# Patient Record
Sex: Female | Born: 1955
Health system: Southern US, Community
[De-identification: ages and names within clinical notes are randomized; demographics above are authoritative.]

## PROBLEM LIST (undated history)

## (undated) DIAGNOSIS — K3184 Gastroparesis: Secondary | ICD-10-CM

## (undated) DIAGNOSIS — H269 Unspecified cataract: Secondary | ICD-10-CM

## (undated) DIAGNOSIS — T7840XA Allergy, unspecified, initial encounter: Secondary | ICD-10-CM

## (undated) DIAGNOSIS — E785 Hyperlipidemia, unspecified: Secondary | ICD-10-CM

## (undated) DIAGNOSIS — K649 Unspecified hemorrhoids: Secondary | ICD-10-CM

## (undated) DIAGNOSIS — K5790 Diverticulosis of intestine, part unspecified, without perforation or abscess without bleeding: Secondary | ICD-10-CM

## (undated) DIAGNOSIS — I341 Nonrheumatic mitral (valve) prolapse: Secondary | ICD-10-CM

## (undated) DIAGNOSIS — K219 Gastro-esophageal reflux disease without esophagitis: Secondary | ICD-10-CM

## (undated) DIAGNOSIS — K5909 Other constipation: Secondary | ICD-10-CM

## (undated) DIAGNOSIS — B029 Zoster without complications: Secondary | ICD-10-CM

## (undated) DIAGNOSIS — K589 Irritable bowel syndrome without diarrhea: Secondary | ICD-10-CM

## (undated) DIAGNOSIS — K602 Anal fissure, unspecified: Secondary | ICD-10-CM

## (undated) DIAGNOSIS — K317 Polyp of stomach and duodenum: Secondary | ICD-10-CM

## (undated) DIAGNOSIS — N390 Urinary tract infection, site not specified: Secondary | ICD-10-CM

## (undated) DIAGNOSIS — M810 Age-related osteoporosis without current pathological fracture: Secondary | ICD-10-CM

## (undated) DIAGNOSIS — I2510 Atherosclerotic heart disease of native coronary artery without angina pectoris: Secondary | ICD-10-CM

## (undated) HISTORY — DX: Allergy, unspecified, initial encounter: T78.40XA

## (undated) HISTORY — DX: Irritable bowel syndrome, unspecified: K58.9

## (undated) HISTORY — DX: Zoster without complications: B02.9

## (undated) HISTORY — DX: Gastroparesis: K31.84

## (undated) HISTORY — DX: Age-related osteoporosis without current pathological fracture: M81.0

## (undated) HISTORY — PX: ESOPHAGOGASTRODUODENOSCOPY: SHX1529

## (undated) HISTORY — DX: Other constipation: K59.09

## (undated) HISTORY — DX: Anal fissure, unspecified: K60.2

## (undated) HISTORY — DX: Nonrheumatic mitral (valve) prolapse: I34.1

## (undated) HISTORY — DX: Diverticulosis of intestine, part unspecified, without perforation or abscess without bleeding: K57.90

## (undated) HISTORY — DX: Gastro-esophageal reflux disease without esophagitis: K21.9

## (undated) HISTORY — DX: Hyperlipidemia, unspecified: E78.5

## (undated) HISTORY — DX: Unspecified cataract: H26.9

## (undated) HISTORY — DX: Polyp of stomach and duodenum: K31.7

## (undated) HISTORY — PX: COLONOSCOPY: SHX174

---

## 2001-02-01 ENCOUNTER — Ambulatory Visit (HOSPITAL_COMMUNITY): Admission: RE | Admit: 2001-02-01 | Discharge: 2001-02-01 | Payer: Self-pay | Admitting: Gastroenterology

## 2004-01-14 ENCOUNTER — Ambulatory Visit: Payer: Self-pay | Admitting: Family Medicine

## 2004-03-23 ENCOUNTER — Ambulatory Visit: Payer: Self-pay | Admitting: Family Medicine

## 2004-03-30 ENCOUNTER — Ambulatory Visit: Payer: Self-pay | Admitting: Family Medicine

## 2004-04-13 ENCOUNTER — Ambulatory Visit: Payer: Self-pay | Admitting: Family Medicine

## 2004-04-27 ENCOUNTER — Ambulatory Visit: Payer: Self-pay | Admitting: Family Medicine

## 2010-02-25 ENCOUNTER — Encounter (INDEPENDENT_AMBULATORY_CARE_PROVIDER_SITE_OTHER): Payer: Self-pay | Admitting: *Deleted

## 2010-03-03 NOTE — Letter (Signed)
Summary: New Patient letter  [facility]  [address] [place], [place] [address]   Phone: [phone]  Fax: [phone]       02/25/2010 MRN: [id number]  [facility] [address] [place], [place]  [address]  Dear Ruth Terry,  Welcome to the Gastroenterology Division at [facility].    You are scheduled to see Dr.  Jarold Motto on 04-03-10 at 9:45A.M. on the 3rd floor at [facility], [address]  We ask that you try to arrive at our office 15 minutes prior to your appointment time to allow for check-in.  We would like you to complete the enclosed self-administered evaluation form prior to your visit and bring it with you on the day of your appointment.  We will review it with you.  Also, please bring a complete list of all your medications or, if you prefer, bring the medication bottles and we will list them.  Please bring your insurance card so that we may make a copy of it.  If your insurance requires a referral to see a specialist, please bring your referral form from your primary care physician.  Co-payments are due at the time of your visit and may be paid by cash, check or credit card.     Your office visit will consist of a consult with your physician (includes a physical exam), any laboratory testing he/she may order, scheduling of any necessary diagnostic testing (e.g. x-ray, ultrasound, CT-scan), and scheduling of a procedure (e.g. Endoscopy, Colonoscopy) if required.  Please allow enough time on your schedule to allow for any/all of these possibilities.    If you cannot keep your appointment, please call [phone] to cancel or reschedule prior to your appointment date.  This allows [place] the opportunity to schedule an appointment for another patient in need of care.  If you do not cancel or reschedule by 5 p.m. the business day prior to your appointment date, you will be charged a $50.00 late cancellation/no-show fee.    Thank you for choosing  [facility] for your medical needs.  We appreciate the opportunity to care for you.  Please visit [place] at our website  to learn more about our practice.                     Sincerely,                                                             The Gastroenterology Division

## 2010-04-03 ENCOUNTER — Other Ambulatory Visit (INDEPENDENT_AMBULATORY_CARE_PROVIDER_SITE_OTHER): Payer: Self-pay

## 2010-04-03 ENCOUNTER — Ambulatory Visit (INDEPENDENT_AMBULATORY_CARE_PROVIDER_SITE_OTHER): Payer: Self-pay | Admitting: Gastroenterology

## 2010-04-03 ENCOUNTER — Encounter: Payer: Self-pay | Admitting: Gastroenterology

## 2010-04-03 VITALS — BP 112/68 | HR 80 | Ht 60.0 in | Wt 102.0 lb

## 2010-04-03 DIAGNOSIS — R161 Splenomegaly, not elsewhere classified: Secondary | ICD-10-CM

## 2010-04-03 DIAGNOSIS — K3189 Other diseases of stomach and duodenum: Secondary | ICD-10-CM

## 2010-04-03 DIAGNOSIS — R1013 Epigastric pain: Secondary | ICD-10-CM

## 2010-04-03 DIAGNOSIS — K30 Functional dyspepsia: Secondary | ICD-10-CM | POA: Insufficient documentation

## 2010-04-03 DIAGNOSIS — K589 Irritable bowel syndrome without diarrhea: Secondary | ICD-10-CM

## 2010-04-03 LAB — IBC PANEL
Saturation Ratios: 20.9 % (ref 20.0–50.0)
Transferrin: 300.2 mg/dL (ref 212.0–360.0)

## 2010-04-03 LAB — IGA: IgA: 117 mg/dL (ref 68–378)

## 2010-04-03 LAB — FERRITIN: Ferritin: 36.4 ng/mL (ref 10.0–291.0)

## 2010-04-03 LAB — AMYLASE: Amylase: 106 U/L (ref 27–131)

## 2010-04-03 LAB — LIPASE: Lipase: 43 U/L (ref 11.0–59.0)

## 2010-04-03 LAB — VITAMIN B12: Vitamin B-12: 370 pg/mL (ref 211–911)

## 2010-04-03 MED ORDER — BENEFIBER PO POWD: 1.0000 | Freq: Every day | ORAL | Status: DC

## 2010-04-03 MED ORDER — PEG-KCL-NACL-NASULF-NA ASC-C 100 G PO SOLR: 1.0000 | Freq: Once | ORAL | Status: AC

## 2010-04-03 MED ORDER — HYOSCYAMINE SULFATE 0.125 MG SL SUBL: 0.1250 mg | SUBLINGUAL_TABLET | SUBLINGUAL | Status: DC | PRN

## 2010-04-03 NOTE — Progress Notes (Signed)
History of Present Illness:  This is a 55 year old Caucasian female with chronic IBS with alternating diarrhea and constipation. Now presents with a two-month history of worsening abdominal cramping or subcostal areas bilaterally with increased abdominal gas and bloating but no melena or hematochezia or specific hepatobiliary complaints. He does have persistent dyspepsia refractory to Nexium therapy, but not had previous endoscopy. There is an H. pylori infection her family apparently. I previously evaluated her several years ago with a colonoscopy in 2003 which was unremarkable. Recent upper abdominal ultrasound exam [facility] was reviewed and is normal as is CBC and metabolic profile. Patient denies a specific food intolerances or lactose intolerance. She does not use sorbitol or fructose. Associated with her problems has been a 710 pound weight loss. She denies abuse of alcohol, cigarettes, or NSAIDs. There is no history of dysphagia, fever, chills, or other systemic complaints.     ROS: The remainder of the 10 point ROS is negative. Her leg bag right posterior shoulder discomfort not related to eating or any specific hepatobiliary complaints. There is no known history of hepatitis or pancreatitis, and recent liver function tests were normal. She denies any specific cardiovascular or pulmonary complaints. He does have excessive gas, bloating, and belching.     Physical Exam: General well developed well nourished patient in no acute distress, appearing their stated age Eyes PERRLA, no icterus, fundoscopic exam per opthamologist Skin no lesions noted Neck supple, no adenopathy, no thyroid enlargement, no tenderness Chest clear to percussion and auscultation Heart no significant murmurs, gallops or rubs noted Abdomen no hepatosplenomegaly masses or tenderness, BS normal.  Rectal inspection normal no fissures, or fistulae noted.  No masses or tenderness on digital exam.  Stool guaiac negative. Extremities no acute joint lesions, edema, phlebitis or evidence of cellulitis. Neurologic patient oriented x 3, cranial nerves intact, no focal neurologic deficits noted. Psychological mental status normal and normal affect.  Assessment and plan: Chronic IBS with recent worsening of unexplained etiology-rule out H. pylori infection, celiac disease, or bacterial overgrowth syndrome. I have scheduled her for followup colonoscopy, endoscopy with small bowel biopsy, biopsies for H. pylori the dissection, and I placed her on when necessary sublingual Levsin as tolerated. Celiac serologies and anemia profile ordered.

## 2010-04-03 NOTE — Patient Instructions (Signed)
Your prescription(s) have been sent to you pharmacy.  Your procedure has been scheduled for _____, please follow the seperate instructions.  Please go to the basement today for your labs.  Today you were given a high fiber diet.

## 2010-04-05 ENCOUNTER — Other Ambulatory Visit: Payer: Self-pay | Admitting: Gastroenterology

## 2010-04-06 ENCOUNTER — Encounter: Payer: Self-pay | Admitting: Gastroenterology

## 2010-04-06 LAB — GLIA (IGA/G) + TTG IGA: Gliadin IgA: 6.4 U/mL (ref <20)

## 2010-04-17 ENCOUNTER — Encounter: Payer: Self-pay | Admitting: Gastroenterology

## 2010-04-20 ENCOUNTER — Encounter: Payer: Self-pay | Admitting: Gastroenterology

## 2010-04-20 ENCOUNTER — Ambulatory Visit (AMBULATORY_SURGERY_CENTER): Payer: Self-pay | Admitting: Gastroenterology

## 2010-04-20 DIAGNOSIS — K317 Polyp of stomach and duodenum: Secondary | ICD-10-CM

## 2010-04-20 DIAGNOSIS — Z1211 Encounter for screening for malignant neoplasm of colon: Secondary | ICD-10-CM

## 2010-04-20 DIAGNOSIS — K3189 Other diseases of stomach and duodenum: Secondary | ICD-10-CM

## 2010-04-20 DIAGNOSIS — K5730 Diverticulosis of large intestine without perforation or abscess without bleeding: Secondary | ICD-10-CM

## 2010-04-20 DIAGNOSIS — K5790 Diverticulosis of intestine, part unspecified, without perforation or abscess without bleeding: Secondary | ICD-10-CM

## 2010-04-20 DIAGNOSIS — K589 Irritable bowel syndrome without diarrhea: Secondary | ICD-10-CM

## 2010-04-20 DIAGNOSIS — K219 Gastro-esophageal reflux disease without esophagitis: Secondary | ICD-10-CM

## 2010-04-20 DIAGNOSIS — D131 Benign neoplasm of stomach: Secondary | ICD-10-CM

## 2010-04-20 DIAGNOSIS — D1330 Benign neoplasm of unspecified part of small intestine: Secondary | ICD-10-CM

## 2010-04-20 HISTORY — DX: Diverticulosis of intestine, part unspecified, without perforation or abscess without bleeding: K57.90

## 2010-04-20 HISTORY — DX: Polyp of stomach and duodenum: K31.7

## 2010-04-20 MED ORDER — SODIUM CHLORIDE 0.9 % IV SOLN: 500.0000 mL | INTRAVENOUS | Status: DC

## 2010-04-20 NOTE — Patient Instructions (Signed)
Please read over discharge instruction sheets given. Read over handouts given on Diverticulosis and high fiber diet. Repeat colonoscopy in 10 years. Biopsies taken, you will receive letter in your mail in about 1-2 weeks. Resume your regular daily medications. Please call us with any questions or concerns.

## 2010-04-21 ENCOUNTER — Telehealth: Payer: Self-pay | Admitting: *Deleted

## 2010-04-21 NOTE — Telephone Encounter (Signed)
Follow up Call- Patient questions:  Do you have a fever, pain , or abdominal swelling? no Pain Score  0 *  Have you tolerated food without any problems? yes  Have you been able to return to your normal activities? yes  Do you have any questions about your discharge instructions: Diet   no Medications  yes Follow up visit  no  Do you have questions or concerns about your Care? yes  Actions: * If pain score is 4 or above: No action needed, pain <4.  Patient stating she was concerned that she may need to be treated for GERD. Patient stating according to the Procedure Report she is being treated for GERD. Patient is presently not on any medications for Reflux. States she took Nexium given by her PCP for 3 days without any relief. This note routed to Dr. Jarold Motto.

## 2010-04-21 NOTE — Telephone Encounter (Signed)
Called patient to inform of Dr. Norval Gable response to her questions regarding treatment for GERD. Patient verbalized understanding that the physician is awaiting biopsy results and will be in touch with her regarding the results and possible treatment.

## 2010-04-21 NOTE — Telephone Encounter (Signed)
Awaiting biopsies

## 2010-04-22 ENCOUNTER — Telehealth: Payer: Self-pay | Admitting: Gastroenterology

## 2010-04-23 NOTE — Telephone Encounter (Signed)
LMOM for pt to call back.

## 2010-04-23 NOTE — Telephone Encounter (Signed)
Notified pt that Dr Jarold Motto thinks the loose stools are from the prep and that the shoulder pain is referred pain from the gas used in the procedure. Pt will continue to take Levsin for cramping.

## 2010-04-23 NOTE — Telephone Encounter (Signed)
Pt had ECL on 04/20/10 revealing mild diverticulosis and BX on ENDO for polyp and CLO . Pt reports she felt good the next day, Tuesday am, but then that evening she had upper abdominal cramping and everything she ate went thru her. She still has some cramping this am on and off but not as bad. She also c/o pain in her right shoulder- is that related? Informed pt the loose stools are still related to the prep and the shoulder pain is referred pain from the gas used during her procedure. Any advice? Thanks.

## 2010-04-23 NOTE — Telephone Encounter (Signed)
i agree. 

## 2010-04-24 ENCOUNTER — Encounter: Payer: Self-pay | Admitting: Gastroenterology

## 2010-04-29 ENCOUNTER — Telehealth: Payer: Self-pay | Admitting: Gastroenterology

## 2010-04-29 ENCOUNTER — Encounter: Payer: Self-pay | Admitting: Gastroenterology

## 2010-04-29 DIAGNOSIS — K9433 Esophagostomy malfunction: Secondary | ICD-10-CM

## 2010-04-29 DIAGNOSIS — R1012 Left upper quadrant pain: Secondary | ICD-10-CM

## 2010-04-29 DIAGNOSIS — IMO0001 Reserved for inherently not codable concepts without codable children: Secondary | ICD-10-CM

## 2010-04-29 DIAGNOSIS — K9430 Esophagostomy complications, unspecified: Secondary | ICD-10-CM

## 2010-04-29 LAB — HELICOBACTER PYLORI SCREEN-BIOPSY: UREASE: NEGATIVE

## 2010-04-29 NOTE — Telephone Encounter (Signed)
NEXIUM 40 MG DAILY...[id number],,

## 2010-04-29 NOTE — Telephone Encounter (Signed)
Pt had ECL on 04/20/10 with normal COLON and Sessile polyp removed that was benign. H.Pylori was negative but I had to call for results downstairs so you did not see it. Pt stated she is still cramping on her r side and having terrible indigestion. Pt's only med from [place] is Benefiber. Pt wants to know if she can have something for reflux? Thanks.

## 2010-04-29 NOTE — Telephone Encounter (Signed)
History of Present Illness: This is a 55 year old Caucasian female with chronic IBS with alternating diarrhea and constipation. Now presents with a two-month history of worsening abdominal cramping or subcostal areas bilaterally with increased abdominal gas and bloating but no melena or hematochezia or specific hepatobiliary complaints. He does have persistent dyspepsia refractory to Nexium therapy, but not had previous endoscopy. There is an H. pylori infection her family apparently. I previously evaluated her several years ago with a colonoscopy in 2003 which was unremarkable. Recent upper abdominal ultrasound exam [facility] was reviewed and is normal as is CBC and metabolic profile. Patient denies a specific food intolerances or lactose intolerance. She does not use sorbitol or fructose. Associated with her problems has been a 710 pound weight loss. She denies abuse of alcohol, cigarettes, or NSAIDs. There is no history of dysphagia, fever, chills, or other systemic complaints.   I'm sorry I did not know until I called her back she had been on Nexium for about 45 days. She is willing to try it again if you want her to. Also, you had her on Hyoscyamine before that was removed from her med list . Is it ok if she stays on that PRN? Thanks.

## 2010-04-30 ENCOUNTER — Encounter: Payer: Self-pay | Admitting: *Deleted

## 2010-04-30 MED ORDER — DEXLANSOPRAZOLE 60 MG PO CPDR: 60.0000 mg | DELAYED_RELEASE_CAPSULE | Freq: Every day | ORAL | Status: DC

## 2010-04-30 NOTE — Telephone Encounter (Signed)
LMOM for pt informing her the scan alone is $689, that does not include the radiologist's fee or other incidental charges. I left her samples at the front desk.

## 2010-04-30 NOTE — Telephone Encounter (Signed)
Spoke with pt about Dr Norval Gable recommendation for GES. Pt has no problem with the test and we can schedule it. Pt's concern is that she's going to the beach 05/05/10 thru 05/12/10 and she would like to feel better. Pt would like the Hyoscyamine renewed and perhaps try another PPI if OK- maybe Dexilant samples? Is that OK?   Thanks.

## 2010-04-30 NOTE — Telephone Encounter (Signed)
Pt scheduled for GES 05/18/10 at 10am. NPO after midnight; stop all stomach meds 24 hr prior.

## 2010-04-30 NOTE — Telephone Encounter (Signed)
ok 

## 2010-04-30 NOTE — Telephone Encounter (Signed)
Spoke with pt to inform her of the GES appointment time and to inform her I will leave sample of Dexilant at the front desk for her. Pt stated she still has hyoscamine and she will use that prn. I will check to see how much she will need as a co pay prior to GES.

## 2010-04-30 NOTE — Telephone Encounter (Signed)
Schedule gastric emptying scan. All other labs and biopsies are normal.

## 2010-04-30 NOTE — Telephone Encounter (Signed)
Lmom for pt to call back. Dr Jarold Motto would like for her to have a Gastric Emptying Scan.

## 2010-05-04 ENCOUNTER — Telehealth: Payer: Self-pay | Admitting: Gastroenterology

## 2010-05-04 NOTE — Telephone Encounter (Signed)
Pt states that she had an endo/colon with Dr. Jarold Motto 2 weeks ago. Pt states that since that time her IBS has really been acting up. She was having problems last week with as soon as she ate something she was running to the bathroom with diarrhea. Over the weekend she started having problems with constipation. Pt wants to know what she can take to help get her "back on track and regulated again." Dr. Christella Hartigan as doc of the day please advise.

## 2010-05-04 NOTE — Telephone Encounter (Signed)
If she is not already on daily fiber, she should start.  Once daily citrucel.

## 2010-05-04 NOTE — Telephone Encounter (Signed)
Pt aware of Dr Jacobs recommendations 

## 2010-05-18 ENCOUNTER — Encounter (HOSPITAL_COMMUNITY)
Admission: RE | Admit: 2010-05-18 | Discharge: 2010-05-18 | Disposition: A | Discharge status: Home / Self Care | Payer: Self-pay | Source: Ambulatory Visit | Attending: Gastroenterology | Admitting: Gastroenterology

## 2010-05-18 ENCOUNTER — Telehealth: Payer: Self-pay | Admitting: *Deleted

## 2010-05-18 ENCOUNTER — Encounter (HOSPITAL_COMMUNITY): Payer: Self-pay

## 2010-05-18 DIAGNOSIS — K219 Gastro-esophageal reflux disease without esophagitis: Secondary | ICD-10-CM | POA: Insufficient documentation

## 2010-05-18 DIAGNOSIS — R109 Unspecified abdominal pain: Secondary | ICD-10-CM | POA: Insufficient documentation

## 2010-05-18 DIAGNOSIS — R1012 Left upper quadrant pain: Secondary | ICD-10-CM

## 2010-05-18 DIAGNOSIS — IMO0001 Reserved for inherently not codable concepts without codable children: Secondary | ICD-10-CM

## 2010-05-18 DIAGNOSIS — K3189 Other diseases of stomach and duodenum: Secondary | ICD-10-CM | POA: Insufficient documentation

## 2010-05-18 DIAGNOSIS — R6881 Early satiety: Secondary | ICD-10-CM | POA: Insufficient documentation

## 2010-05-18 MED ORDER — DEXLANSOPRAZOLE 60 MG PO CPDR: 60.0000 mg | DELAYED_RELEASE_CAPSULE | Freq: Every day | ORAL | Status: DC

## 2010-05-18 MED ORDER — AMBULATORY NON FORMULARY MEDICATION: Status: DC

## 2010-05-18 MED ORDER — TECHNETIUM TC 99M SULFUR COLLOID
2.2000 | Freq: Once | INTRAVENOUS | Status: AC | PRN
  Administered 2010-05-18: 2.2 via ORAL

## 2010-05-18 NOTE — Telephone Encounter (Signed)
Lm with husband for pt to call back

## 2010-05-18 NOTE — Telephone Encounter (Signed)
Message copied by Graciella Freer on Mon May 18, 2010  4:28 PM ------      Message from: Jarold Motto, DAVID      Created: Mon May 18, 2010  4:05 PM       STEP 3 GASTROPARESIS DIET AND TRY DOMPERIDOME 10 MG AC,,TID,,,OV 1 MOS AFTER RX.

## 2010-05-18 NOTE — Telephone Encounter (Signed)
Notified pt I will mail her a copy of the step 3 gastroparesis diet. I will order Domperidome from [place] and Dexilant from [place]. I woll schedule her for  an OV when 1 month after she starts the Domperidone, pt stated understanding.

## 2010-05-21 ENCOUNTER — Telehealth: Payer: Self-pay | Admitting: *Deleted

## 2010-05-21 MED ORDER — DEXLANSOPRAZOLE 60 MG PO CPDR: 60.0000 mg | DELAYED_RELEASE_CAPSULE | Freq: Every day | ORAL | Status: DC

## 2010-05-21 NOTE — Telephone Encounter (Signed)
Message copied by Graciella Freer on Thu May 21, 2010  2:52 PM ------      Message from: Jarold Motto, DAVID      Created: Mon May 18, 2010  4:05 PM       STEP 3 GASTROPARESIS DIET AND TRY DOMPERIDOME 10 MG AC,,TID,,,OV 1 MOS AFTER RX.

## 2010-05-21 NOTE — Telephone Encounter (Signed)
Message copied by Graciella Freer on Thu May 21, 2010  2:53 PM ------      Message from: Jarold Motto, DAVID      Created: Mon May 18, 2010  4:05 PM       STEP 3 GASTROPARESIS DIET AND TRY DOMPERIDOME 10 MG AC,,TID,,,OV 1 MOS AFTER RX.

## 2010-05-21 NOTE — Telephone Encounter (Signed)
Pt called to report Dexilant is working well for her- will we order it? She has heard from the pharmacy about her  Domperidone and she will call to set up the F/U appt. She also questions the Gastroparesis Step 3 diet and the avoidance of fiber. Pt stated she needs the fiber d/t her IBS and Fiber One bars and Prunes were helping her. Left a message with her husband she can drink prune juice and take the Benefiber. Dr Jarold Motto, any other advice for fiber sources?

## 2010-05-22 MED ORDER — DEXLANSOPRAZOLE 60 MG PO CPDR: 60.0000 mg | DELAYED_RELEASE_CAPSULE | Freq: Every day | ORAL | Status: DC

## 2010-05-22 NOTE — Telephone Encounter (Addendum)
Reordered Dexilant to [facility]

## 2010-05-22 NOTE — Telephone Encounter (Signed)
Can advance diet once on domperidome.Marland KitchenMarland Kitchen

## 2010-06-02 ENCOUNTER — Telehealth: Payer: Self-pay | Admitting: *Deleted

## 2010-06-02 NOTE — Telephone Encounter (Signed)
Yes, good job.Marland KitchenMarland Kitchen

## 2010-06-02 NOTE — Telephone Encounter (Signed)
Notified pt that Dr Jarold Motto stated she can add back the fiber- Fiber One, Fiber brownies, prunes. She will start out taking the Benefiber tid until she gets regular and she can try to decrease it to bid which is what she took prior to her Movi Prep. She will try to remain on the remainder of Gas. Level 3 diet. Pt will call for any problems prior to her 06/23/10.

## 2010-06-02 NOTE — Telephone Encounter (Signed)
Pt has been scheduled to see Dr Jarold Motto on 06/23/10 at 11:15am.

## 2010-06-02 NOTE — Telephone Encounter (Signed)
Pt returned my call from several days ago. Pt reports she's doing "fair". She is taking the Domperidone and is on the Gastroparesis level 3 diet. Pt stated her bowels haven't been regular since her COLON on 04/20/10. Before the diet, pt was regular by eating prunes, Fiber One cereal and Fiber Brownies and Benefiber. She is having problems one day with diarrhea and constipation the next. Is it ok if she adds back some of the fiber? Thanks.

## 2010-06-02 NOTE — Telephone Encounter (Signed)
yes

## 2010-06-08 ENCOUNTER — Telehealth: Payer: Self-pay | Admitting: Gastroenterology

## 2010-06-08 NOTE — Telephone Encounter (Signed)
I spoke with the patient she would like to speak with Dr Jarold Motto about testing her for gallbladder disease.  She will come in tomorrow at 10:00 and see Dr Jarold Motto.

## 2010-06-09 ENCOUNTER — Ambulatory Visit (INDEPENDENT_AMBULATORY_CARE_PROVIDER_SITE_OTHER): Payer: Self-pay | Admitting: Gastroenterology

## 2010-06-09 ENCOUNTER — Encounter: Payer: Self-pay | Admitting: Gastroenterology

## 2010-06-09 DIAGNOSIS — K900 Celiac disease: Secondary | ICD-10-CM

## 2010-06-09 DIAGNOSIS — K9041 Non-celiac gluten sensitivity: Secondary | ICD-10-CM

## 2010-06-09 DIAGNOSIS — K3184 Gastroparesis: Secondary | ICD-10-CM

## 2010-06-09 NOTE — Patient Instructions (Addendum)
Continue all of your medications. Make an appt to come back and see Dr Jarold Motto in 6 weeks.

## 2010-06-09 NOTE — Progress Notes (Signed)
This is a [age]-year-old Caucasian female with chronic IBS area and she recently has had upper abdominal pain and early satiety with associated nausea, and had a gastric emptying scan which showed 60% retention at 2 hours. She's been on a step 3 gastroparesis diet and domperidone 10 mg 30 minutes before meals with only slight improvement. She had negative endoscopy and colonoscopy including small bowel biopsy. Her celiac serologies were all negative except for slightly increased IgG anti-gliadin. She also has lower abdominal cramping relieved with when necessary sublingual Levsin.    Current Medications, Allergies, Past Medical History, Past Surgical History, Family History and Social History were reviewed in [employer] record.  Pertinent Review of Systems Negative       Assessment and Plan: IBS with delayed gastric emptying. Had a long discussion today concerning her problems and have decided to continue current plans for another 6 weeks before proceeding with small bowel series or institution of a trial of gluten-free foods. She is to continue her fiber supplements and other medications as outlined. Encounter Diagnoses  Name Primary?  . Gastroparesis   . Gluten intolerance

## 2010-06-11 ENCOUNTER — Telehealth: Payer: Self-pay | Admitting: Gastroenterology

## 2010-06-11 NOTE — Telephone Encounter (Signed)
Pt states that benifiber is discontinued and she wanted to know what fiber to try I advised any otc fiber is fine.

## 2010-06-15 ENCOUNTER — Telehealth: Payer: Self-pay | Admitting: Gastroenterology

## 2010-06-15 MED ORDER — METRONIDAZOLE 250 MG PO TABS: 250.0000 mg | ORAL_TABLET | Freq: Three times a day (TID) | ORAL | Status: AC

## 2010-06-15 MED ORDER — RIFAXIMIN 550 MG PO TABS: 550.0000 mg | ORAL_TABLET | Freq: Two times a day (BID) | ORAL | Status: DC

## 2010-06-15 NOTE — Telephone Encounter (Signed)
Per Mike Gip, PA, ok to order Flagyl 250mg  po tid x 7 days. Notified pt I ordered the drug at [facility].

## 2010-06-15 NOTE — Telephone Encounter (Signed)
Pt will follow a low gas diet- mailed to her.

## 2010-06-15 NOTE — Telephone Encounter (Signed)
Pt reports loose watery stools for some time now. She saw Dr Jarold Motto on 06/09/10 and he started her on [employer]. She reports a few good days since starting that , but she's still bothered by the loose stools and gas. Pt stated her abdomen made so much noise, she had to leave church. Pt has stopped the prunes, but still eats a Fiber One Brownie, Fiber One Cereal and before, Benefiber. Benefiber has stopped production and pt tried Metamucil, but can't stand it. Advised pt to try the generic Benefiber at [facility] and Simethicone or GasX. I will ask Mike Gip, PA for suggestions. Pt stated understanding. [name] suggested she may have bacterial overgrowth and ordered Xifaxin; if too expensive, call back and we can order Flagyl. Pt stated understanding.

## 2010-06-23 ENCOUNTER — Ambulatory Visit: Payer: Self-pay | Admitting: Gastroenterology

## 2010-06-26 ENCOUNTER — Telehealth: Payer: Self-pay | Admitting: Gastroenterology

## 2010-06-26 NOTE — Telephone Encounter (Signed)
Needs ov....consider lotronex rx.Marland Kitchen

## 2010-06-26 NOTE — Telephone Encounter (Signed)
Pt scheduled to see Dr. Jarold Motto 07/13/10@10 :30am, pt aware of appt date and time.

## 2010-06-26 NOTE — Telephone Encounter (Signed)
Ov needed,consider Lotronex rx

## 2010-06-26 NOTE — Telephone Encounter (Signed)
Pt states that her reflux is better and her symptoms from gastroparesis seem to be better. Pt states that she was given Flagyl 250mg  to take TID for 7 days by Mike Gip PA. She finished this on Monday and has been having diarrhea since she stopped the Flagyl. She is also having a lot of cramping with the diarrhea, she states her stools are just mainly brown water. Pt doesn't know if she needs to take more Flagyl or what she should do. Dr. Jarold Motto please advise.

## 2010-06-29 ENCOUNTER — Telehealth: Payer: Self-pay | Admitting: Gastroenterology

## 2010-06-29 NOTE — Telephone Encounter (Signed)
Per note last week, Dr Jarold Motto stated pt needed an OV; Dr Jarold Motto had 1 tomorrow. Pt agreed to change and will come in at 2:30pm. Pt wanted to know whether to bring in a stool sample; pt stated she has a fever blister which she never has until there's "infection in my body". Advised pt to bring one in just in case.

## 2010-06-29 NOTE — Telephone Encounter (Signed)
Pt called on 06/26/10 stated since she stopped the Flagyl, she's had brown watery stools, sometimes 2-6 daily, lower abdominal cramping and awful gas pains. She was given an OV for 07/12/08 and you wrote consider Lotronex, Pt states overall, she's better with reflux and upper abdominal pain, but can't have a formed stool and the new pain and gas are really bothering her. She was told last week to stop the Benefiber and she doesn't think the [facility] is doing much good- no change in 3 weeks. Do you want stools samples, OV with you or Gunnar Fusi, Lotronex? Please advise.

## 2010-06-30 ENCOUNTER — Encounter: Payer: Self-pay | Admitting: Gastroenterology

## 2010-06-30 ENCOUNTER — Ambulatory Visit (INDEPENDENT_AMBULATORY_CARE_PROVIDER_SITE_OTHER): Payer: Self-pay | Admitting: Gastroenterology

## 2010-06-30 VITALS — BP 112/66 | HR 108 | Ht 60.0 in | Wt 97.2 lb

## 2010-06-30 DIAGNOSIS — R197 Diarrhea, unspecified: Secondary | ICD-10-CM

## 2010-06-30 DIAGNOSIS — T887XXA Unspecified adverse effect of drug or medicament, initial encounter: Secondary | ICD-10-CM

## 2010-06-30 DIAGNOSIS — T50905A Adverse effect of unspecified drugs, medicaments and biological substances, initial encounter: Secondary | ICD-10-CM

## 2010-06-30 DIAGNOSIS — K3184 Gastroparesis: Secondary | ICD-10-CM

## 2010-06-30 MED ORDER — RIFAXIMIN 550 MG PO TABS: 550.0000 mg | ORAL_TABLET | Freq: Two times a day (BID) | ORAL | Status: DC

## 2010-06-30 NOTE — Patient Instructions (Signed)
Please go to the basement today for your labs.  Take the Xifaxan samples one tablet twice a day for 7 days.  Decrease your Domperidone to once a day before lunch.

## 2010-06-30 NOTE — Progress Notes (Signed)
This is a 55 year old Caucasian female with gastroparesis currently doing much better on domperidone 10 mg 3 times a day. However, this is called crampy lower abdominal pain and diarrhea with a increased abdominal gas and bloating. She was treated . Her with metronidazole without improvement. She also has had no improvement with probiotic therapy. Recent endoscopy and colonoscopy were unremarkable except for acid reflux, and she is on Dexilant 60 mg a day. There's been no anorexia, weight loss, systemic or hepatobiliary complaints. She currently is on a low fiber diet, denies use of sorbitol or fructose. I do not think she has had prior cholecystectomy her abdominal surgery.  Current Medications, Allergies, Past Medical History, Past Surgical History, Family History and Social History were reviewed in [employer] record.  Pertinent Review of Systems Negative   Physical Exam: Healthy appearing, no acute distress, no mild chronic liver disease noted. Chest is clear cardiac exam is unremarkable. I cannot appreciate hepatosplenomegaly, abdominal masses, tenderness, ascites, or distention. Bowel sounds are normal. Mental status is clear peripheral extremities are unremarkable.   Assessment and Plan: Diarrhea either from domperidone or Dexilant. Recent colonoscopy was otherwise unremarkable. Also small bowel biopsy was normal without evidence of celiac disease. Her gastroparesis is much improved. I have reduced her domperidone dosage to 10 mg before her main meal of the day and twice a day if tolerated. Also appeared to have given her samples of Xifaxan 550 mg twice a day for one week trial. If her diarrhea persists, we may need to discontinue domperidone, Dexilant, and try up H2 blocker therapy. See her back in 2-3 weeks' time for followup. Encounter Diagnosis  Name Primary?  . Diarrhea Yes

## 2010-07-01 ENCOUNTER — Other Ambulatory Visit: Payer: Self-pay

## 2010-07-01 DIAGNOSIS — R197 Diarrhea, unspecified: Secondary | ICD-10-CM

## 2010-07-13 ENCOUNTER — Ambulatory Visit: Payer: Self-pay | Admitting: Gastroenterology

## 2010-07-13 ENCOUNTER — Telehealth: Payer: Self-pay | Admitting: Gastroenterology

## 2010-07-13 MED ORDER — CHOLESTYRAMINE 4 G PO PACK
PACK | ORAL | Status: DC
Start: 2010-07-13 — End: 2010-07-28

## 2010-07-13 NOTE — Telephone Encounter (Signed)
Informed pt Dr Jarold Motto wants her to try QUESTRAN 4g in juice midmorning- DO NOT TAKE WITHIN 2 HOURS OF OTHER MEDS!!! Pt stated understanding.

## 2010-07-13 NOTE — Telephone Encounter (Signed)
Try Questran 4 g with juice in mid a.m.

## 2010-07-13 NOTE — Telephone Encounter (Signed)
Left a message for patient to call us back .

## 2010-07-13 NOTE — Telephone Encounter (Signed)
Patient calling to report that decreasing the Domperidone to daily has not helped with the diarrhea, cramps and gas. She states that she is having diarrhea 2-3 times/day. When she is on antibiotics, the diarrhea is less and as soon as she finished the antibiotics, the diarrhea is back. She also, asked about the stool specimen results and I told her the results as not back yet. She is wondering if she should increase the Domperidone back. Please, advise.

## 2010-07-21 ENCOUNTER — Telehealth: Payer: Self-pay | Admitting: Gastroenterology

## 2010-07-21 ENCOUNTER — Ambulatory Visit: Payer: Self-pay | Admitting: Gastroenterology

## 2010-07-21 MED ORDER — PRAMOXINE-HC 1-1 % EX CREA: TOPICAL_CREAM | CUTANEOUS | Status: DC

## 2010-07-21 NOTE — Telephone Encounter (Signed)
Prn questran,analpram also

## 2010-07-21 NOTE — Telephone Encounter (Signed)
Pt was ordered Questran on 07/13/10 for diarrhea. She did not fill because she was waiting on payday. She reports no diarrhea since last week. Is Questran to be used on a full time basis or PRN? Also, can she have something for hemorrhoids? Thanks.

## 2010-07-21 NOTE — Telephone Encounter (Signed)
Informed pt of Dr Patterson's instructions; pt stated understanding. 

## 2010-07-21 NOTE — Telephone Encounter (Signed)
lmom for pt to call back. Dr Jarold Motto stated she can take the Questran PRN and he ordered her Analpram for her hemorrhoids.

## 2010-07-28 ENCOUNTER — Ambulatory Visit (INDEPENDENT_AMBULATORY_CARE_PROVIDER_SITE_OTHER): Payer: Self-pay | Admitting: Gastroenterology

## 2010-07-28 ENCOUNTER — Encounter: Payer: Self-pay | Admitting: Gastroenterology

## 2010-07-28 VITALS — BP 122/80 | HR 102 | Ht 60.0 in | Wt 96.0 lb

## 2010-07-28 DIAGNOSIS — Z8719 Personal history of other diseases of the digestive system: Secondary | ICD-10-CM | POA: Insufficient documentation

## 2010-07-28 DIAGNOSIS — K900 Celiac disease: Secondary | ICD-10-CM

## 2010-07-28 DIAGNOSIS — K3184 Gastroparesis: Secondary | ICD-10-CM

## 2010-07-28 DIAGNOSIS — K9041 Non-celiac gluten sensitivity: Secondary | ICD-10-CM

## 2010-07-28 DIAGNOSIS — K219 Gastro-esophageal reflux disease without esophagitis: Secondary | ICD-10-CM | POA: Insufficient documentation

## 2010-07-28 NOTE — Progress Notes (Signed)
History of Present Illness: This is a 55 year old Caucasian female with documented gastroparesis who developed rather severe diarrhea on domperidone 10 mg 3 times a day. I've decreased her dosage to 10 mg before her main meal of the day, and her diarrhea has abated. She continues with gas, bloating, and diffuse abdominal discomfort. Pending is stool elastase-1 exam. Review of her celiac profile shows an elevated IgG gliadin body. Small bowel biopsy was unremarkable as was exam for H. pylori with recent endoscopy. She is up-to-date on her colonoscopy exams. A brief trial of Questran 4 g a day has caused constipation. She is concerned about continued anorexia and weight loss. Review of her record shows that these problems have been in existence for greater than 10 years, and she has had a diagnosis of GI motility disorder for years. I have reviewed her workup in detail with the patient and this has been completed.  Current Medications, Allergies, Past Medical History, Past Surgical History, Family History and Social History were reviewed in [employer] record. PE: Awake and alert no acute distress appears stated age. Outpatient stigmata of chronic liver disease. Her abdomen shows no significant distention, organomegaly, masses or tenderness. Bowel sounds are normal and not hyperactive or obstructive in character chest is clear cardiac exam is unremarkable. Mental status is normal.  Assessment and plan: GI motility disorder with associated gastroparesis, gas and bloating. Treatment in the past for bacterial overgrowth syndrome with Xifaxan and probiotics has not been of use. I have decided to try her on a gluten restricted diet for one month while continuing all medications except for cholestyramine. Axial chart review her weight of 96 pounds is the heaviest weight we have recorded with previous weights at 80 and 91 pounds. She is to see me back in one month's time for followup. No  diagnosis found.

## 2010-07-28 NOTE — Patient Instructions (Addendum)
Stop your Questran. Follow the gluten free diet.  Make an office visit to see Dr Jarold Motto in one month  Gluten-Free Diet Gluten is a protein found in many grains. Gluten is present in wheat, rye, and barley. Gluten may cause intestinal injury when ingested by susceptible individuals.  A sample piece (biopsy) of the small intestine is usually required for a positive diagnosis of gluten sensitivity. Dietary treatment consists of eliminating foods and food ingredients from wheat, rye, and barley. When these are excluded completely from the diet, most patients regain function of the small intestine. Strict compliance is important even during symptom-free periods. Gluten sensitive patients must realize that this is a lifelong diet. During the first stages of treatment, some people will also need to restrict sugar in dairy products that contain lactose, which is a naturally occurring sugar. Lactose is difficult to absorb when the small intestines are damaged and is called lactose intolerance.  WHY YOU NEED THIS DIET (Ask your caregiver to explain the conditions that you may have.)  Celiac disease / nontropical sprue / gluten-sensitive enteropathy.   Dermatitis herpetiformis.  SPECIAL NOTES  Gluten from wheat, rye, and barley protein interferes with absorbing food in individuals with gluten sensitivity. It is important to read all labels, as gluten may have been added as an incidental ingredient. Words to check for on the label include: flour, starch, durum flour, graham flour, phosphated flour, self-rising flour, semolina, farina, modified food starch, cereal, thickening, fillers, emulsifiers, any kind of malt flavoring, and hydrolyzed vegetable protein. A Registered Dietician can help you identify possible harmful ingredients in the foods you normally eat.   If you are not sure whether an ingredient contains gluten, be sure to check with the manufacturer. Note that some manufacturers may change  ingredients without notice. Always read labels.  Since flour and cereal products are quite often used in the preparation of foods, it is important to be aware of the methods of preparation used, as well as the foods themselves. This is especially true when you are dining out. FOOD GROUP: STARCHES ALLOWED/RECOMMENDED: Only those prepared from arrowroot, corn, potato, rice, and bean flours. Rice wafers (*); pure cornmeal tortillas; popcorn; some crackers and chips(*). Hot cereals made from cornmeal; Cream of Rice. Cold cereals such as puffed rice, Kellogg's Sugar Pops, Post's Fruity & Chocolate Pebbles, [employer] and [employer], Featherweight's [employer]; [employer] Cocoa Puffs, and Gluten-free Oatmeal. White or sweet potatoes; yams; hominy; rice or wild rice; special gluten-free pasta. Some oriental rice noodles or bean noodles. AVOID: All wheat, and rye cereals; wheat germ, barley, bran, graham, malt, bulgur, and millet (-). NOTE: Avoid cereals containing malt as a flavoring such as Rice Krispies. Regular noodles, spaghetti, macaroni, most packaged rice mixes(*). All others containing wheat, rye, and/or barley. FOOD GROUP: Vegetables ALLOWED/RECOMMENDED: All plain, fresh, frozen, or canned vegetables.  AVOID: Creamed vegetables(*), vegetables canned in sauces(*). Any prepared with wheat, rye, or barley. FOOD GROUP: Fruit ALLOWED/RECOMMENDED: All fresh, frozen, canned, or dried fruits. Fruit juices. AVOID: Thickened or prepared fruits; some pie fillings(*). FOOD GROUP: Meat & substitutes ALLOWED/RECOMMENDED: Meat, fish, poultry, or eggs prepared without added wheat, rye, or barley; luncheon meat(*), frankfurters(*), and pure meat. All aged cheese, processed cheese products(*). Cottage cheese(+), cream cheese(+). Dried beans and peas; lentils. AVOID: Any meat or meat alternate containing wheat, rye, barley, or gluten stabilizers; Bread-containing products such as Swiss steak,  croquettes, and meatloaf. Tuna canned in vegetable broth(*); [place] with HVP injected as part  of the basting; any cheese product containing oat gum as an ingredient. FOOD GROUP: Milk ALLOWED/RECOMMENDED: Milk. Yogurt made with allowed ingredients(*). AVOID: Commercial chocolate milk which may have cereal added(*). Malted milk. FOOD GROUP: Soups and combination foods ALLOWED/RECOMMENDED: Homemade broth and soups made with allowed ingredients; some canned or frozen soups are allowed(*). Combination or prepared foods that do not contain gluten(*). Read labels. AVOID: All soups containing wheat, rye, or barley flour. Bouillon and bouillon cubes that contain hydrolyzed vegetable protein (HVP). Combination or prepared foods that do contain gluten(*). FOOD GROUP: Desserts ALLOWED/RECOMMENDED: Custard, junket, homemade puddings from cornstarch, rice, and tapioca; some pudding mixes(*). Gelatin desserts, ices, and sherbet(*). Cake, cookies, and other desserts prepared with allowed flours. Some commercial ice creams(*). AVOID: Cakes, cookies, doughnuts, pastries, etc., prepared with wheat, rye, and/or barley flour. Some commercial ice creams(*), ice cream flavors which contain cookies, crumbs, or cheesecake(*); ice cream cones. All commercially prepared mixes for cakes, cookies, and other desserts(*); bread pudding; puddings thickened with flour. FOOD GROUP: Sweets ALLOWED/RECOMMENDED: Sugar, honey, syrup(*), molasses, jelly, jam, plain hard candy, marshmallows, gumdrops, homemade candies free from wheat, rye, or barley. Coconut. AVOID: Commercial candies containing wheat, rye, or barley(*). [place] is dusted with wheat flour. Chocolate-coated nuts, which are often rolled in flour. FOOD GROUP: Fats and Oils ALLOWED/RECOMMENDED: Butter, margarine, vegetable oil, sour cream(+), whipping cream, shortening, lard, cream, mayonnaise(*). Some commercial salad dressings(*). Peanut butter. AVOID: Some commercial  salad dressings(*). FOOD GROUP: Beverages ALLOWED/RECOMMENDED: Coffee (regular or decaffeinated), tea, herbal tea (read label to be sure that no wheat flour has been added). Carbonated beverages; some root beers(*). AVOID: Cereal beverages such as Postum or Ovaltine ; beer (unless Gluten free), ale, malted milk; some root beers, wine, and sake. FOOD GROUP: Condiments / miscellaneous ALLOWED/RECOMMENDED: Salt, pepper, herbs, spices, extracts, food colorings; monosodium glutamate (MSG); cider, rice, and wine vinegar; bicarbonate of soda; baking powder; Chun King soy sauce; nuts, coconut, chocolate, and pure cocoa powder. AVOID: Some curry powder (*), some dry seasoning mixes (*), some gravy extracts (*), some meat sauces (*), some catsup (*), some prepared mustard (*), horseradish (*), some soy sauce (*), chip dips (*), some chewing gum (*). Yeast extract (contains barley). Caramel color (may contain malt). Flour and thickening agents ALLOWED/RECOMMENDED: Arrowroot starch (A), Corn bran (B), Corn flour (B,C,D), Corn germ (B), Cornmeal (B,C,D), Corn starch (A), Potato flour (B,C,E), Potato starch flour (B,C,E), Rice bran (B), Rice flours: Plain, brown (B,C,D,E) Sweet (A,B,C,F). Rice polish (B,C,G), Soy flour (B,C,G), Tapioca starch (A). ARE GOOD FOR: (A) Good thickening agent (B) Good when combined with other flours (C)Best combined with milk and eggs in baked products (D) Best in grainy-textured products (E) Produces drier product than other flours (F) Produces moister product than other flours (G) Adds distinct flavor to product; use in moderation. (*) Check labels and investigate any questionable ingredients.  (-) Additional research is needed before this product can be recommended. (+) Check vegetable gum used. * These amounts indicate the minimum number of servings needed from the basic food groups to provide a variety of nutrients essential to good health. The word "maximum" is used if  amounts of certain foods eaten must be controlled. Combination foods may count as full or partial servings from the food groups. Dark green, leafy, or orange vegetables are recommended 3 or 4 times weekly to provide vitamin A. A good source of vitamin C is recommended daily.  NOTE: Brand names are used for clarification only, and do not constitute an  endorsement. Also, ingredients may be changed by the manufacturer without notice. Always read labels. SAMPLE MEAL PLAN* Breakfast  Fruit and/or Juice, Cereal (from allowed grains), Toast (from allowed grains), Heart healthy tub margarine, Jam or jelly, Milk Beverage Noon Meal  Meat or Substitute, Gluten-free bread, Vegetable and/or salad, Heart health margarine, Fruit or dessert, Milk Beverage Evening Meal  Meat or Substitute, Potato or Rice, Salad with dressing and/or soup, Gluten-free bread, Vegetable, Fruit or dessert, Heart healthy margarine, Beverage SAMPLE MENU* Breakfast  Orange juice, Banana, Rice or corn cereal, Toast (gluten-free bread), Heart healthy tub margarine, Jam, Milk, Coffee / tea Noon Meal  Chicken salad sandwich (with gluten-free bread and mayonnaise), Sliced tomatoes, Heart healthy tub margarine, Apple, Milk, Coffee/tea Evening Meal  Roast beef, Baked potato/margarine, Broccoli, Lettuce salad with gluten-free dressing, Gluten-free bread, Custard, Heart healthy margarine, Coffee / tea * These meal plans are provided as samples; your daily meal plans will vary. Document Released: 12/21/2004 Document Re-Released: 03/17/2009 [facility] Patient Information 2011 [place], [place].

## 2010-08-25 ENCOUNTER — Ambulatory Visit (INDEPENDENT_AMBULATORY_CARE_PROVIDER_SITE_OTHER): Payer: Self-pay | Admitting: Gastroenterology

## 2010-08-25 ENCOUNTER — Other Ambulatory Visit (INDEPENDENT_AMBULATORY_CARE_PROVIDER_SITE_OTHER): Payer: Self-pay

## 2010-08-25 ENCOUNTER — Encounter: Payer: Self-pay | Admitting: Gastroenterology

## 2010-08-25 VITALS — BP 100/70 | HR 64 | Ht 60.0 in | Wt 94.0 lb

## 2010-08-25 DIAGNOSIS — Z733 Stress, not elsewhere classified: Secondary | ICD-10-CM

## 2010-08-25 DIAGNOSIS — R0789 Other chest pain: Secondary | ICD-10-CM

## 2010-08-25 DIAGNOSIS — K219 Gastro-esophageal reflux disease without esophagitis: Secondary | ICD-10-CM

## 2010-08-25 DIAGNOSIS — K589 Irritable bowel syndrome without diarrhea: Secondary | ICD-10-CM

## 2010-08-25 DIAGNOSIS — F439 Reaction to severe stress, unspecified: Secondary | ICD-10-CM

## 2010-08-25 LAB — TSH: TSH: 1.95 u[IU]/mL (ref 0.35–5.50)

## 2010-08-25 LAB — T4: T4, Total: 9 ug/dL (ref 5.0–12.5)

## 2010-08-25 MED ORDER — CILIDINIUM-CHLORDIAZEPOXIDE 2.5-5 MG PO CAPS: 1.0000 | ORAL_CAPSULE | Freq: Three times a day (TID) | ORAL | Status: DC | PRN

## 2010-08-25 NOTE — Progress Notes (Signed)
This is a 55 year old Caucasian female with chronic IBS and possible gastroparesis. She has alternating diarrhea and constipation with gas and bloating, partially relieved by combination of domperidone and Levsin. She also has chronic acid reflux which is well controlled with the Dexilant 60 mg a day. She continues with a typical chest pain but no other cardiac symptoms. She is previous heavy smoker, has a history of hypercholesterolemia, and a family history of coronary artery disease. She is under a lot of personal stress at work which seemed to exacerbate her problems. She has been diagnosed as having mitral valve prolapse and is on Corgard 40 mg a day. She denies dysphagia, melena, hematochezia, or any specific hepatobiliary complaints.  Current Medications, Allergies, Past Medical History, Past Surgical History, Family History and Social History were reviewed in [employer] record.  Pertinent Review of Systems Negative... specifically no chest pain with exertion, noted palpitations, shortness of breath or dyspnea.   Physical Exam: Healthy appearing thin white female in no acute distress. Chest is clear cardiac exam is unremarkable except for occasional ectopic beats. There is no organomegaly, bowel masses or tenderness. Peripheral extremities are unremarkable mental status is normal. I cannot appreciate stigmata of chronic liver disease. Her weight today is 94 pounds with a BMI of 18.36. Blood pressure 100/70 and pulse 64 and regular.    Assessment and Plan: Probable IBS as her main problem was well controlled acid reflux on PPI therapy. I am somewhat concerned about recurrent chest pain and her cardiovascular risk factors, and I scheduled her for cardiac evaluation possible stress testing. I have discontinued Levsin, domperidone, and we'll try Librax one by mouth 3 times a day before meals. He is up-to-date on endoscopy and colonoscopy exams. Encounter Diagnosis  Name  Primary?  . Chest pain, atypical Yes

## 2010-08-25 NOTE — Patient Instructions (Addendum)
Please go to the basement today for your labs.  Stop your Levsin and Domperidone.  Take Librax three times a day with meals, a rx has been sent to your pharmacy.  Dr Jarold Motto has referred you to Dr Jens Som on 09/09/2010 arrive at 10:15am for your 10:30am appt. at [facility] their office is located at [address], the phone number is [phone] if you can not keep this appt make sure you call to cancel or reschedule 24 hours in advance or there is a $50 no show fee. Make sure you take a complete list of your medications and take what you can pay for the office visit since your self pay.

## 2010-09-03 ENCOUNTER — Encounter: Payer: Self-pay | Admitting: Cardiology

## 2010-09-08 ENCOUNTER — Encounter: Payer: Self-pay | Admitting: Cardiology

## 2010-09-09 ENCOUNTER — Encounter: Payer: Self-pay | Admitting: Cardiology

## 2010-09-09 ENCOUNTER — Ambulatory Visit (INDEPENDENT_AMBULATORY_CARE_PROVIDER_SITE_OTHER): Payer: Self-pay | Admitting: Cardiology

## 2010-09-09 DIAGNOSIS — Z720 Tobacco use: Secondary | ICD-10-CM | POA: Insufficient documentation

## 2010-09-09 DIAGNOSIS — R0789 Other chest pain: Secondary | ICD-10-CM

## 2010-09-09 DIAGNOSIS — F17200 Nicotine dependence, unspecified, uncomplicated: Secondary | ICD-10-CM

## 2010-09-09 NOTE — Progress Notes (Signed)
HPI: pleasant 55 year old female with history of mitral valve prolapse for evaluation of chest pain. For the past 8 months the patient has had intermittent epigastric/substernal discomfort. The pain is described as a pressure. It does not radiate. The pain is not pleuritic, positional, exertional or related to food. No associated symptoms. It resolves spontaneously. Some improvement with dexilant. Pain lasts approximately one hour. Note she denies dyspnea on exertion, orthopnea, PND, pedal edema, palpitations, syncope or chest pain. Because of the above we were asked to further evaluate.  Current Outpatient Prescriptions  Medication Sig Dispense Refill  . clidinium-chlordiazePOXIDE (LIBRAX) 2.5-5 MG per capsule Take 1 capsule by mouth 3 (three) times daily as needed.  90 capsule  3  . dexlansoprazole (DEXILANT) 60 MG capsule Take 60 mg by mouth daily.        . Probiotic Product (ALIGN) 4 MG CAPS Take 1 capsule by mouth daily.        . nadolol (CORGARD) 40 MG tablet Take 1/2 tablet by mouth once a day         Allergies  Allergen Reactions  . Sulfa Drugs Cross Reactors Nausea Only  . Macrobid Rash    Past Medical History  Diagnosis Date  . MVP (mitral valve prolapse)   . IBS (irritable bowel syndrome)   . Hemorrhoids   . Hyperlipidemia   . GERD (gastroesophageal reflux disease)   . Gastroparesis     No past surgical history on file.  History   Social History  . Marital Status: Married    Spouse Name: N/A    Number of Children: N/A  . Years of Education: N/A   Occupational History  . Magazine features editor school program   Social History Main Topics  . Smoking status: Current Some Day Smoker -- 0.5 packs/day for 6 years    Types: Cigarettes  . Smokeless tobacco: Current User   Comment: USE SOMKELESS CIGARETTES  . Alcohol Use: 0.0 oz/week     1 a week  . Drug Use: No  . Sexually Active: Not on file   Other Topics Concern  . Not on file   Social  History Narrative  . No narrative on file    Family History  Problem Relation Age of Onset  . Hypertension Mother   . Diabetes Father   . Heart attack Father     Age 68    ROS: no fevers or chills, productive cough, hemoptysis, dysphasia, odynophagia, melena, hematochezia, dysuria, hematuria, rash, seizure activity, orthopnea, PND, pedal edema, claudication. Remaining systems are negative.  Physical Exam: General:  Well developed/well nourished in NAD Skin warm/dry Patient not depressed No peripheral clubbing Back-normal HEENT-normal/normal eyelids Neck supple/normal carotid upstroke bilaterally; no bruits; no JVD; no thyromegaly chest - CTA/ normal expansion CV - RRR/normal S1 and S2; no murmurs, rubs or gallops;  PMI nondisplaced Abdomen -NT/ND, no HSM, no mass, + bowel sounds, no bruit 2+ femoral pulses, no bruits Ext-no edema, chords, 2+ DP Neuro-grossly nonfocal  ECG NSR, no ST changes

## 2010-09-09 NOTE — Assessment & Plan Note (Signed)
Plan stress echocardiogram for risk stratification. Symptoms are more likely GI related.

## 2010-09-09 NOTE — Patient Instructions (Signed)
Your physician has requested that you have a stress echocardiogram. For further information please visit [email or web address]. Please follow instruction sheet as given.   

## 2010-09-09 NOTE — Assessment & Plan Note (Signed)
Patient counseled on discontinuing. 

## 2010-09-16 ENCOUNTER — Telehealth: Payer: Self-pay | Admitting: Gastroenterology

## 2010-09-16 NOTE — Telephone Encounter (Signed)
lmom for pt to call back. The only thing I can find in the system is cardiac related.

## 2010-09-16 NOTE — Telephone Encounter (Signed)
Informed pt her Thyroid tests were normal. She is concerned she's not gaining her weight back. She has a stress test this week and just got the money to buy her Librax. She requested a f/u appt to eval the Librax and Cardiology consult- pt given appt on 10/16/10 at 1130am.

## 2010-09-18 ENCOUNTER — Ambulatory Visit (HOSPITAL_COMMUNITY): Payer: Self-pay | Attending: Cardiology

## 2010-09-18 ENCOUNTER — Ambulatory Visit (HOSPITAL_BASED_OUTPATIENT_CLINIC_OR_DEPARTMENT_OTHER): Payer: Self-pay | Admitting: Radiology

## 2010-09-18 DIAGNOSIS — R0789 Other chest pain: Secondary | ICD-10-CM

## 2010-09-18 DIAGNOSIS — E785 Hyperlipidemia, unspecified: Secondary | ICD-10-CM | POA: Insufficient documentation

## 2010-09-18 DIAGNOSIS — R0989 Other specified symptoms and signs involving the circulatory and respiratory systems: Secondary | ICD-10-CM | POA: Insufficient documentation

## 2010-09-18 DIAGNOSIS — R0609 Other forms of dyspnea: Secondary | ICD-10-CM | POA: Insufficient documentation

## 2010-09-18 DIAGNOSIS — Z720 Tobacco use: Secondary | ICD-10-CM

## 2010-09-18 DIAGNOSIS — R072 Precordial pain: Secondary | ICD-10-CM

## 2010-09-18 DIAGNOSIS — F17200 Nicotine dependence, unspecified, uncomplicated: Secondary | ICD-10-CM | POA: Insufficient documentation

## 2010-09-23 ENCOUNTER — Telehealth: Payer: Self-pay | Admitting: *Deleted

## 2010-09-23 NOTE — Telephone Encounter (Signed)
Message copied by Freddi Starr on Wed Sep 23, 2010  5:07 PM ------      Message from: Lewayne Bunting      Created: Mon Sep 21, 2010  8:06 AM       Hazle Coca

## 2010-09-23 NOTE — Telephone Encounter (Signed)
Continue present meds Ruth Terry  

## 2010-09-23 NOTE — Telephone Encounter (Signed)
Spoke with pt, she is wondering if she needs to cont with the corgard for MVP. Will forward for dr Jens Som review Ruth Terry

## 2010-09-24 NOTE — Telephone Encounter (Signed)
PT AWARE TO CONT WITH CORGARD./CY

## 2010-09-24 NOTE — Telephone Encounter (Signed)
PT AWARE TO CONT TO TAKE  CURRENT MEDS./C

## 2010-09-24 NOTE — Telephone Encounter (Signed)
Pt returning call to Christine. Please call back.  

## 2010-10-16 ENCOUNTER — Ambulatory Visit (INDEPENDENT_AMBULATORY_CARE_PROVIDER_SITE_OTHER): Payer: Self-pay | Admitting: Gastroenterology

## 2010-10-16 ENCOUNTER — Encounter: Payer: Self-pay | Admitting: Gastroenterology

## 2010-10-16 VITALS — BP 110/68 | HR 80 | Ht 60.0 in | Wt 94.8 lb

## 2010-10-16 DIAGNOSIS — K589 Irritable bowel syndrome without diarrhea: Secondary | ICD-10-CM

## 2010-10-16 DIAGNOSIS — K219 Gastro-esophageal reflux disease without esophagitis: Secondary | ICD-10-CM

## 2010-10-16 MED ORDER — CALCIUM POLYCARBOPHIL 625 MG PO TABS: 625.0000 mg | ORAL_TABLET | Freq: Every day | ORAL | Status: DC

## 2010-10-16 NOTE — Patient Instructions (Signed)
Your prescription has been sent to your pharamcy. Please continue to take your Dexilant. Librax has been added to your list of allergies. Start Align for two weeks, this may be purchased over the counter. We have given you a handout on FODMAP.

## 2010-10-16 NOTE — Progress Notes (Signed)
History of Present Illness: This is a 55-year-old Caucasian female with chronic IBS, questionable gastroparesis, gluten sensitivity, alternating diarrhea and constipation, and chronic acid reflux. She currently is doing extremely well on Dexilant 60 mg a day for GERD mop probiotic therapy, and Corgard 40 mg daily for hypertension. I reviewed her radiographs, procedures, and laboratory data in detail. We prescribed Librax as a therapeutic trial, and she had severe itching and hyperactivity from this medication. Early she is maintaining her weight and eating a fairly regular diet. She denies systemic or hepatobiliary complaints.    Current Medications, Allergies, Past Medical History, Past Surgical History, Family History and Social History were reviewed in [employer] record.   Assessment and plan: Chronic IBS with acid reflux and probable chronic malabsorption of nonabsorbable carbohydrates. I placed her on our special IBS diet, and we will konsyl fiber tablets, and we will alternate probiotic therapy as tolerated. See no need for further GI valuation at this time. She did have recent cardiac evaluation by Dr. Jens Som, and there was no evidence of coronary artery disease. She does have a history of mitral valve prolapse. Encounter Diagnosis  Name Primary?  . IBS (irritable bowel syndrome) Yes

## 2010-11-12 ENCOUNTER — Telehealth: Payer: Self-pay | Admitting: Gastroenterology

## 2010-11-13 NOTE — Telephone Encounter (Signed)
ok 

## 2010-11-13 NOTE — Telephone Encounter (Signed)
Pt was seen 10/16/10 with hx of chronic IBS, ? Gastroparesis, Gluten Sensitivity, Alternating Diarrhea and Constipation and Chronic Acid Reflux. She was placed ona Special IBS diet as well as Konsyl Fiber and Align to help with the gas. Pt reports she had been doing well, but she has been under a lot of stress lately and her reflux and bowels have suffered. She has constipation more and an increase in gas. She was instructed to take [employer] and Align and she remains on that. She is also taking Dexilant. Pt wonders if she should increase Dexilant to BID and she doesn't know what to do for the gas. Please advise.

## 2010-11-18 NOTE — Telephone Encounter (Signed)
Encounter was lost from 11/13/10. Informed pt to try Gas X and she can try to increase her dexilant to bid. Pt cancelled her appt for next week d/t she wants to give this time to work. Pt will call back to r/s.

## 2010-11-24 ENCOUNTER — Ambulatory Visit: Payer: Self-pay | Admitting: Gastroenterology

## 2010-12-07 ENCOUNTER — Telehealth: Payer: Self-pay | Admitting: Gastroenterology

## 2010-12-07 NOTE — Telephone Encounter (Signed)
KEEP APPT

## 2010-12-07 NOTE — Telephone Encounter (Signed)
Pt called and states that since Thursday evening she has not been feeling well. Pt c/o pain above her belly button all across her stomach, she states that she has a full feeling. She has passed some stool but still feels like there is some that needs to come out, she took mag citrate. States that her right shoulder is hurting her and she has had nausea. She scheduled an appt with Dr. Jarold Motto for tomorrow @9 :45am but wants to know if there is anything else she can do or try before the appt. Dr. Jarold Motto please advise.

## 2010-12-07 NOTE — Telephone Encounter (Signed)
Pt aware.

## 2010-12-08 ENCOUNTER — Ambulatory Visit (INDEPENDENT_AMBULATORY_CARE_PROVIDER_SITE_OTHER): Payer: Self-pay | Admitting: Gastroenterology

## 2010-12-08 ENCOUNTER — Encounter: Payer: Self-pay | Admitting: Gastroenterology

## 2010-12-08 ENCOUNTER — Other Ambulatory Visit (INDEPENDENT_AMBULATORY_CARE_PROVIDER_SITE_OTHER): Payer: Self-pay

## 2010-12-08 VITALS — BP 116/70 | HR 58 | Ht 60.0 in | Wt 92.0 lb

## 2010-12-08 DIAGNOSIS — R1013 Epigastric pain: Secondary | ICD-10-CM | POA: Insufficient documentation

## 2010-12-08 DIAGNOSIS — K3184 Gastroparesis: Secondary | ICD-10-CM

## 2010-12-08 LAB — HEPATIC FUNCTION PANEL
ALT: 11 U/L (ref 0–35)
Total Bilirubin: 0.4 mg/dL (ref 0.3–1.2)

## 2010-12-08 LAB — BASIC METABOLIC PANEL
CO2: 31 mEq/L (ref 19–32)
Calcium: 9.5 mg/dL (ref 8.4–10.5)
Creatinine, Ser: 0.8 mg/dL (ref 0.4–1.2)
GFR: 84.46 mL/min (ref >60.00)

## 2010-12-08 LAB — CBC WITH DIFFERENTIAL/PLATELET
Basophils Relative: 0.9 % (ref 0.0–3.0)
Eosinophils Relative: 2.7 % (ref 0.0–5.0)
MCV: 89 fl (ref 78.0–100.0)
Monocytes Absolute: 0.3 10*3/uL (ref 0.1–1.0)
Neutrophils Relative %: 58 % (ref 43.0–77.0)
RBC: 4.7 Mil/uL (ref 3.87–5.11)
WBC: 3.3 10*3/uL — ABNORMAL LOW (ref 4.5–10.5)

## 2010-12-08 LAB — TSH: TSH: 1.15 u[IU]/mL (ref 0.35–5.50)

## 2010-12-08 LAB — IBC PANEL
Saturation Ratios: 28.4 % (ref 20.0–50.0)
Transferrin: 274.1 mg/dL (ref 212.0–360.0)

## 2010-12-08 LAB — AMYLASE: Amylase: 93 U/L (ref 27–131)

## 2010-12-08 LAB — FOLATE: Folate: 7.5 ng/mL (ref >5.9)

## 2010-12-08 LAB — VITAMIN B12: Vitamin B-12: 295 pg/mL (ref 211–911)

## 2010-12-08 LAB — LIPASE: Lipase: 33 U/L (ref 11.0–59.0)

## 2010-12-08 MED ORDER — SUCRALFATE 1 GM/10ML PO SUSP: 1.0000 g | Freq: Four times a day (QID) | ORAL | Status: DC

## 2010-12-08 MED ORDER — DEXLANSOPRAZOLE 60 MG PO CPDR: 60.0000 mg | DELAYED_RELEASE_CAPSULE | Freq: Every day | ORAL | Status: DC

## 2010-12-08 NOTE — Patient Instructions (Addendum)
You have been given a separate informational sheet regarding your tobacco use, the importance of quitting and local resources to help you quit. Please go to the basement today for your labs.  Follow the step 3 gastroparesis diet given today.  Take Domperidone at bedtime. Take Carafate after meals and at bedtime.

## 2010-12-08 NOTE — Progress Notes (Signed)
This is a 55 year old Caucasian female has had extensive GI evaluation for abdominal pain and alternating diarrhea and constipation. But also has documented gastroparesis. She had 92% retention in her stomach at one hour and 60% at 2 hours. She continues with early satiety, epigastric pain, and constipation. Previous domperidone therapy 10 mg 3 times a day resulted in diarrhea and was discontinued. She has had recent endoscopy, colonoscopy, and upper GI ultrasound and multiple labs. Evaluation for celiac disease and H. pylori been negative. Colonoscopy showed no evidence of obstruction, and multiple biopsies showed no evidence of collagenous or microscopic colitis. Patient denies weight loss, systemic, or hepatobiliary complaints.  Current Medications, Allergies, Past Medical History, Past Surgical History, Family History and Social History were reviewed in [employer] record.  Pertinent Review of Systems Negative   Physical Exam: Thin appearing patient in no acute distress. I cannot appreciate stigmata of chronic liver disease. Her abdomen shows no distention, organomegaly, masses or tenderness. There is a positive succussion splash in the epigastric area. Bowel sounds are normal. Mental status is normal. There is no peripheral edema a, phlebitis, or swollen joints.    Assessment and Plan: GI motility disorder with significant gastroparesis leading to poor by mouth intake and constipation. I placed her on domperidone 10 mg at bedtime, step 3 gastroparesis diet, and when necessary liquid Carafate. I will see her back in one month's time for followup. She may need referral to [facility] to see Dr. Alycia Rossetti for further motility studies and treatment. Encounter Diagnoses  Name Primary?  . Gastroparesis Yes  . Abdominal pain, epigastric

## 2010-12-09 ENCOUNTER — Telehealth: Payer: Self-pay | Admitting: Gastroenterology

## 2010-12-09 NOTE — Telephone Encounter (Signed)
Pt wanted to know lab results.

## 2010-12-10 ENCOUNTER — Telehealth: Payer: Self-pay | Admitting: Gastroenterology

## 2010-12-10 NOTE — Telephone Encounter (Signed)
Pt called and states that she saw Dr. Jarold Motto on Tuesday but she is still having some problems. States that she took domperidone on Tuesday and Wednesday night. She had cramping and diarrhea both days. Today she states that she is constipated and feels like she needs to go to the bathroom but only mucous comes out. Still taking gas-x, dexilant, domperidone, carafate, and align Pt wants to know should she continue the domperidone? She states she just wants to get her bowels regulated. Dr. Jarold Motto please advise.

## 2010-12-11 NOTE — Telephone Encounter (Signed)
Message left for pt. To continue the domperidone. Asked pt to call back regarding referral.

## 2010-12-11 NOTE — Telephone Encounter (Signed)
Continue domperidone as directed with her last clinic visit 3 days ago. Go ahead and refer her to [facility] for her gastroparesis related

## 2010-12-11 NOTE — Telephone Encounter (Signed)
Spoke with pt and she would like to know a couple of things before making the appt with Dr. Alycia Rossetti. 1. How much would the appt be for someone without insurance? 2. What appts are available.

## 2010-12-14 ENCOUNTER — Encounter: Payer: Self-pay | Admitting: Gastroenterology

## 2010-12-14 NOTE — Telephone Encounter (Signed)
Spoke with Dr Coral Else ofc and Leavy Cella stated Dr Alycia Rossetti does not see pt's w/o insurance and she stated they have an out pt clinic for those pts. Informed pt who stated it's ok to be referred to the out pt clinic. Called the GI clinic [phone], and they do not have a schedule yet; keep trying.

## 2010-12-17 NOTE — Telephone Encounter (Signed)
Spoke with the GI Clinic again at [facility] and still no appts; they recommended I fax the info and they will contact the pt with an appt. Faxed info to [phone] and notified the pt. Pt requested I cancel her appt with Dr Jarold Motto d/t she has been doing better the last couple of days and she will r/s in 01/2011.

## 2010-12-22 ENCOUNTER — Ambulatory Visit: Payer: Self-pay | Admitting: Gastroenterology

## 2010-12-25 ENCOUNTER — Ambulatory Visit: Payer: Self-pay | Admitting: Gastroenterology

## 2011-01-06 ENCOUNTER — Telehealth: Payer: Self-pay | Admitting: Gastroenterology

## 2011-01-06 NOTE — Telephone Encounter (Signed)
Pt wanted to know if she could take miralax it seems to be helping her, advised she could continue.

## 2011-01-28 ENCOUNTER — Other Ambulatory Visit: Payer: Self-pay | Admitting: Gastroenterology

## 2011-02-11 ENCOUNTER — Telehealth: Payer: Self-pay | Admitting: Gastroenterology

## 2011-02-11 NOTE — Telephone Encounter (Signed)
Pt called to report her PCP gave her an antibiotic. UA showed a little bacteria, but d/t her s&s , he gave a antibiotic. She will call next week if no better.

## 2011-02-11 NOTE — Telephone Encounter (Signed)
Pt with long GI hx with extensive workup including ECL, Upper GI U/S and various medication therapy. Last OV 12/08/10 and she was instructed to take Domperidone at night, Carafate after meals and at bedtime, continue Dexilant, align and Miralax. Pt reports she was doing great until yesterday after lunch. After lunch and supper, she had terrible cramps in her lower stomach; some diarrhea last night, but none since.  She woke up in the middle of the night with the cramping and pain in her lower back. Pt denies fever or vomiting; a little nausea. When pt called and couldn't get an appt, she called her PCP and she will see them this am. She feels it might be a UTI. Pt will call back if it isn't GU and we will go from there.

## 2011-04-19 ENCOUNTER — Encounter: Payer: Self-pay | Admitting: *Deleted

## 2011-04-23 ENCOUNTER — Encounter: Payer: Self-pay | Admitting: Gastroenterology

## 2011-04-23 ENCOUNTER — Other Ambulatory Visit (INDEPENDENT_AMBULATORY_CARE_PROVIDER_SITE_OTHER): Payer: Self-pay

## 2011-04-23 ENCOUNTER — Ambulatory Visit (INDEPENDENT_AMBULATORY_CARE_PROVIDER_SITE_OTHER): Payer: Self-pay | Admitting: Gastroenterology

## 2011-04-23 VITALS — BP 110/68 | HR 96 | Ht 60.0 in | Wt 94.0 lb

## 2011-04-23 DIAGNOSIS — R350 Frequency of micturition: Secondary | ICD-10-CM

## 2011-04-23 DIAGNOSIS — K599 Functional intestinal disorder, unspecified: Secondary | ICD-10-CM

## 2011-04-23 DIAGNOSIS — K5989 Other specified functional intestinal disorders: Secondary | ICD-10-CM

## 2011-04-23 DIAGNOSIS — K219 Gastro-esophageal reflux disease without esophagitis: Secondary | ICD-10-CM

## 2011-04-23 LAB — URINALYSIS
Bilirubin Urine: NEGATIVE
Hgb urine dipstick: NEGATIVE
Ketones, ur: NEGATIVE
Leukocytes, UA: NEGATIVE
Nitrite: NEGATIVE
Urobilinogen, UA: 0.2 (ref 0.0–1.0)
pH: 6 (ref 5.0–8.0)

## 2011-04-23 MED ORDER — SACCHAROMYCES BOULARDII 250 MG PO CAPS: 250.0000 mg | ORAL_CAPSULE | Freq: Every day | ORAL | Status: AC

## 2011-04-23 MED ORDER — LINACLOTIDE 145 MCG PO CAPS: 1.0000 | ORAL_CAPSULE | Freq: Every day | ORAL | Status: DC

## 2011-04-23 NOTE — Progress Notes (Signed)
This is a 57 year old Caucasian female with a chronic GI motility disorder, gastroparesis and chronic constipation with chronic abdominal gas and bloating with associated" gas pains".  She is mild constipation responsive to MiraLax. Review of her old records shows no response in the past to Xifaxan therapy. She denies any specific food intolerances, and workup for celiac disease has been negative. Her appetite is good and she is actually gaining weight.  Current Medications, Allergies, Past Medical History, Past Surgical History, Family History and Social History were reviewed in [employer] record.  Pertinent Review of Systems Negative... she complains of urinary frequency and nocturia without dysuria or hematuria.   Physical Exam: Blood pressure 110/68 pulse 96 and regular. Weight 94 pounds with BMI of 18.36. I cannot appreciate stigmata of chronic liver disease. Her abdomen shows no distention, organomegaly, masses or tenderness. She does have very active bowel sounds but they're not obstructive in quality. Rectal exam was deferred. Mental status is normal.    Assessment and Plan: GI motility disorder with associated chronic bacterial overgrowth syndrome. I have placed her on Linzess 145 mcg a day with adjustment in her MiraLax, discontinued Align probiotic and we'll try Florstar daily, also FODMAP diet trial of office followup in 2-3 weeks' time. She is to continue her standard antireflux maneuvers with Dexilant 60 mg every morning and domperidone 10 mg at bedtime. Urinalysis and urine culture ordered also.  Encounter Diagnosis  Name Primary?  . Urine frequency Yes

## 2011-04-23 NOTE — Patient Instructions (Addendum)
You have been given a separate informational sheet regarding your tobacco use, the importance of quitting and local resources to help you quit. Take Linzess 145mg  once a day on a empty stomach, 30 day sample given call back if it works well and we can call in a rx.  Stop your Align and start Florastor once a day you can by this OTC.  Follow the FODMAP diet given again today.  Make follow up office visit for 2-3 weeks.  Please go to the basement today for your labs.

## 2011-05-14 ENCOUNTER — Ambulatory Visit (INDEPENDENT_AMBULATORY_CARE_PROVIDER_SITE_OTHER): Payer: Self-pay | Admitting: Gastroenterology

## 2011-05-14 ENCOUNTER — Encounter: Payer: Self-pay | Admitting: Gastroenterology

## 2011-05-14 VITALS — BP 106/72 | HR 64 | Ht 60.0 in | Wt 94.6 lb

## 2011-05-14 DIAGNOSIS — K602 Anal fissure, unspecified: Secondary | ICD-10-CM

## 2011-05-14 DIAGNOSIS — K5901 Slow transit constipation: Secondary | ICD-10-CM

## 2011-05-14 DIAGNOSIS — K219 Gastro-esophageal reflux disease without esophagitis: Secondary | ICD-10-CM

## 2011-05-14 DIAGNOSIS — K5989 Other specified functional intestinal disorders: Secondary | ICD-10-CM

## 2011-05-14 DIAGNOSIS — K599 Functional intestinal disorder, unspecified: Secondary | ICD-10-CM

## 2011-05-14 DIAGNOSIS — K3184 Gastroparesis: Secondary | ICD-10-CM

## 2011-05-14 MED ORDER — BISACODYL 10 MG RE SUPP: 10.0000 mg | RECTAL | Status: AC | PRN

## 2011-05-14 MED ORDER — LIDOCAINE HCL 2 % EX GEL: CUTANEOUS | Status: AC

## 2011-05-14 MED ORDER — POLYETHYLENE GLYCOL 3350 17 G PO PACK: 17.0000 g | PACK | Freq: Every day | ORAL | Status: DC

## 2011-05-14 NOTE — Patient Instructions (Addendum)
You have been given a separate informational sheet regarding your tobacco use, the importance of quitting and local resources to help you quit. Your prescription(s) have been sent to you pharmacy Use Xylocaine per rectum as needed.  Buy Metamucil OTC and take once a day with juice.  Take Miralax once capful in 8 ounces of water every night at bedtime.  Take Linzess one tablet by mouth every morning.  Take Domperidone every night at bedtime.  Use Dulcolax Suppository per recum as needed.

## 2011-05-14 NOTE — Progress Notes (Signed)
This is a 56 year old Caucasian female with chronic functional constipation and chronic GERD. It is suspected that she has a diffuse GI motility disorder, possibly Raynaud's phenomenon, but she denies any other neuromuscular problems or urologic difficulties. She is able to maintain her weight at a steady level. She is up-to-date on her endoscopy and colonoscopy exams. Despite multiple medications she continues with mild constipation, but this is markedly improved. She also has known gastroparesis, and is on 10 mg of domperidone at bedtime area she takes Dexilant 60 mg every morning,Linzess 145 mg every morning, MiraLax at bedtime, and probiotics. She denies abdominal pain, nausea vomiting, or rectal bleeding. Recently she's had discomfort with hard bowel movements and has not used any local creams or salves.  Current Medications, Allergies, Past Medical History, Past Surgical History, Family History and Social History were reviewed in [employer] record.  Pertinent Review of Systems Negative   Physical Exam: Blood pressure 106/72, pulse 64 and regular, weight 94 pounds, and BMI 18.48. He is a very thin-appearing patient in no acute distress. Examination the abdomen shows no organomegaly, and distention, masses or tenderness. Bowel sounds are normal. Specks of rectum does show a superficial posterior fissure, rectal exam otherwise is unremarkable without masses or tenderness or evidence of a rectocele. Soft stool is present which is guaiac negative. Mental status is normal.    Assessment and Plan: Diffuse GI motility disorder with gastroparesis and secondary acid reflux, severe slow transit constipation. We will continue her above regime for her constipation but had 1 teaspoon of Metamucil in juice daily with liberal by mouth fluids and when necessary Dulcolax suppositories. I have supplied local 5% Xylocaine cream per rectum as needed for her superficial fissure. We will see her on  when necessary basis as needed. Our review her records, and if not done, consider esophageal manometry to see if we can document a motility disturbance. Previous gastric emptying study showed 60% retention at 2 hours. Upper abdominal ultrasound exam was negative in February of 2012. No diagnosis found.

## 2011-05-19 ENCOUNTER — Telehealth: Payer: Self-pay | Admitting: Gastroenterology

## 2011-05-19 NOTE — Telephone Encounter (Signed)
ok 

## 2011-05-19 NOTE — Telephone Encounter (Signed)
Informed pt it's ok per Dr Jarold Motto to use the suppositories with the Xylocaine; pt stated understanding.

## 2011-05-19 NOTE — Telephone Encounter (Signed)
Pt reports 2 great weeks followed by 2 bad weeks. Pt was started on Linzess and reports it's helping some. She saw you on 05/14/11 and you saw a rectal tear and ordered Xylocaine. Pt has developed discomfort in her rectum and doesn't know if it's hemorrhoids or not. She wants to know if she can safely use the xylocaine and a mail order suppository that [employer] that has PHENYLEPHRINE in it; nurse's drug book lists it as a vasopressor. Thanks?

## 2011-05-19 NOTE — Telephone Encounter (Signed)
lmom for pt to call back

## 2011-05-25 ENCOUNTER — Telehealth: Payer: Self-pay | Admitting: Gastroenterology

## 2011-05-25 MED ORDER — HYDROCORTISONE ACETATE 25 MG RE SUPP: 25.0000 mg | Freq: Two times a day (BID) | RECTAL | Status: AC

## 2011-05-25 NOTE — Telephone Encounter (Signed)
Rx sent to pharmacy for pt.  

## 2011-05-25 NOTE — Telephone Encounter (Signed)
Pt states that the Linzess Dr. Jarold Motto gave her is working. States that her hemorrhoids are bothering her though. She has used OTC supp but they are not helping. Can pt have some Anusol HC supp called in? Please advise.

## 2011-05-25 NOTE — Telephone Encounter (Signed)
yes

## 2011-06-03 ENCOUNTER — Telehealth: Payer: Self-pay | Admitting: Gastroenterology

## 2011-06-04 MED ORDER — LINACLOTIDE 145 MCG PO CAPS: 1.0000 | ORAL_CAPSULE | Freq: Every day | ORAL | Status: DC

## 2011-06-04 NOTE — Telephone Encounter (Signed)
Continue prior regime and Xylocaine ointment

## 2011-06-04 NOTE — Telephone Encounter (Signed)
Informed pt of DR Patterson's instructions.

## 2011-06-04 NOTE — Telephone Encounter (Signed)
Pt with a fissure reports she has a heavy achy feeling " in the inside of her buttocks ", not her rectum. She reports she stands a lot and the pain was not there until she got the fissure.  Pt wants to know if this is normal and is there anything she can do like sitz baths, etc? Thanks.

## 2011-06-04 NOTE — Telephone Encounter (Signed)
Late entry 06/03/11    Pt states she can't afford the LINZESS; >$240. She is afraid to pay for it until she knows if it's helping. She reports so far it's helping, but she wants to make sure before she pays all that money will leave her some more samples and discount cards; at the front desk.

## 2011-06-22 ENCOUNTER — Ambulatory Visit: Payer: Self-pay | Admitting: Gastroenterology

## 2011-06-24 ENCOUNTER — Telehealth: Payer: Self-pay | Admitting: Gastroenterology

## 2011-06-24 NOTE — Telephone Encounter (Signed)
Pt states she was doing great last week with her fissure, hemorrhoids and constipation, but this week everything is messed up again. We went over Dr Norval Gable orders/recommendations and she isn't consistent with Metamucil. Pt states she hasn't got on a regular schedule with it and hasn't fit it into her daily routine. She will try to drink metamucil in juice on a consistent basis and increase her liquids intake and call for further problems.

## 2011-07-02 ENCOUNTER — Telehealth: Payer: Self-pay | Admitting: Gastroenterology

## 2011-07-02 NOTE — Telephone Encounter (Signed)
Pt wanted to reports she is still having problems with everything! Lower back pain, she thinks her fissure is back and she doesn't feel as though she is completing her BMs.. Explained to pt Dr Jarold Motto has nothing until after 07/23/11, but I can schedule her with the NP. Pt reports she will keep her 07/13/11 appt with Dr Jarold Motto, but call to see a NP if her problems worsens.

## 2011-07-13 ENCOUNTER — Ambulatory Visit (INDEPENDENT_AMBULATORY_CARE_PROVIDER_SITE_OTHER): Payer: Self-pay | Admitting: Gastroenterology

## 2011-07-13 ENCOUNTER — Encounter: Payer: Self-pay | Admitting: Gastroenterology

## 2011-07-13 VITALS — BP 110/70 | HR 72 | Ht 60.0 in | Wt 97.2 lb

## 2011-07-13 DIAGNOSIS — K5989 Other specified functional intestinal disorders: Secondary | ICD-10-CM

## 2011-07-13 DIAGNOSIS — K599 Functional intestinal disorder, unspecified: Secondary | ICD-10-CM

## 2011-07-13 NOTE — Patient Instructions (Addendum)
You have been given Linzess samples to continue at current dose.

## 2011-07-13 NOTE — Progress Notes (Signed)
This is a 55 year old Caucasian female with a diffuse GI motility disorder. She currently is on multiple prokinetic medications is asymptomatic.  Current Medications, Allergies, Past Medical History, Past Surgical History, Family History and Social History were reviewed in [employer] record.  Pertinent Review of Systems Negative   Physical Exam: She appears healthy in no distress. Abdominal exam shows no organomegaly, masses, tenderness, or distention. Bowel sounds are active but nonobstructive. Weight is 97 pounds which is her standard weight.  Assessment and Plan: Continue current medications as reviewed with when necessary GI followup as needed. No diagnosis found.

## 2011-08-05 ENCOUNTER — Telehealth: Payer: Self-pay | Admitting: Gastroenterology

## 2011-08-05 ENCOUNTER — Other Ambulatory Visit: Payer: Self-pay | Admitting: Gastroenterology

## 2011-08-05 MED ORDER — AMBULATORY NON FORMULARY MEDICATION: Status: DC

## 2011-08-05 NOTE — Telephone Encounter (Signed)
Med sent for refill

## 2011-08-10 ENCOUNTER — Telehealth: Payer: Self-pay | Admitting: Gastroenterology

## 2011-08-10 ENCOUNTER — Other Ambulatory Visit: Payer: Self-pay | Admitting: *Deleted

## 2011-08-10 DIAGNOSIS — K3184 Gastroparesis: Secondary | ICD-10-CM

## 2011-08-10 MED ORDER — HYDROCORTISONE ACETATE 25 MG RE SUPP: 25.0000 mg | Freq: Two times a day (BID) | RECTAL | Status: DC

## 2011-08-10 MED ORDER — DEXLANSOPRAZOLE 60 MG PO CPDR: 60.0000 mg | DELAYED_RELEASE_CAPSULE | Freq: Every day | ORAL | Status: DC

## 2011-08-10 NOTE — Telephone Encounter (Signed)
rx sent

## 2011-08-10 NOTE — Telephone Encounter (Signed)
Thanks. What about increasing the Linzess and or Metamucil? Thanks.

## 2011-08-10 NOTE — Telephone Encounter (Signed)
ANNUSOL SUPP OK

## 2011-08-10 NOTE — Telephone Encounter (Signed)
NOT Karlene Einstein

## 2011-08-10 NOTE — Telephone Encounter (Signed)
Pt reports doing well the past 24 days with Linzess 145 mcg and Metamucil once daily. For the past 10 days, she feels as though she's not completing or emptying her stool. She also reports hemorrhoids- probably from straining she states. Please advise; pt wants to know if she should increase the Linzess and/or Metamucil more than once daily? Also, can we refill her Anusol? Thanks.

## 2011-08-10 NOTE — Telephone Encounter (Signed)
Patient has a question about one of her prescriptions and some symptoms that she is having.  Would like to speak to you about this.

## 2011-08-10 NOTE — Telephone Encounter (Signed)
Informed pt I will order Anusol and she may increase the metamucil- not the Linzess; pt stated understanding.

## 2011-08-11 ENCOUNTER — Telehealth: Payer: Self-pay | Admitting: Gastroenterology

## 2011-08-11 MED ORDER — HYDROCORTISONE ACETATE 25 MG RE SUPP: 25.0000 mg | Freq: Two times a day (BID) | RECTAL | Status: AC

## 2011-08-11 NOTE — Telephone Encounter (Signed)
Rx sent to [facility]. It appears that it was sent to [facility] pharmacy on 08/10/11 in error.

## 2011-09-01 ENCOUNTER — Telehealth: Payer: Self-pay | Admitting: *Deleted

## 2011-09-01 MED ORDER — LINACLOTIDE 145 MCG PO CAPS: 145.0000 ug | ORAL_CAPSULE | Freq: Every day | ORAL | Status: DC

## 2011-09-01 NOTE — Telephone Encounter (Signed)
Pt called and states she needs some Linzess samples. Samples left up front for pickup. Pt aware.

## 2011-09-08 ENCOUNTER — Telehealth: Payer: Self-pay | Admitting: Gastroenterology

## 2011-09-08 NOTE — Telephone Encounter (Signed)
Pt reports not feeling well since Sunday, 09/05/11. She had to r/s her appt and will now be seen on 09/17/11 . She reports pain in her hips, legs, lower back and rectum. She doesn't feel like it's constipation, because she goes in the am and at lunch and if she feels she needs to go at night, she will take a suppository to have a BM. She is on Miralax, Linzess and Metamucil BID. She reports a lot of pressure in her rectum like when she had a fissure. We discussed several other of her problems, and she decided she will tx her problems as though she has a fissure. She will use Xylocaine and Anusol suppositories and see if this helps and give me an update on Friday. She will also try to decrease the Metamucil to daily instead of BID.

## 2011-09-08 NOTE — Telephone Encounter (Signed)
Pt called to ask if a yeast infection could cause her problems? She had a possible sinus infection on 08/26/11 and went to her doc and was placed on an antibiotic. She left for the beach that day and hasn't felt good since. The AB started with CEPH.. She states she usually doesn't get the usual itching, discharge or burning usually associated with yeast. She will try this and call back later in the week with an update.

## 2011-09-14 ENCOUNTER — Ambulatory Visit: Payer: Self-pay | Admitting: Gastroenterology

## 2011-09-17 ENCOUNTER — Encounter: Payer: Self-pay | Admitting: Gastroenterology

## 2011-09-17 ENCOUNTER — Ambulatory Visit (INDEPENDENT_AMBULATORY_CARE_PROVIDER_SITE_OTHER): Payer: Self-pay | Admitting: Gastroenterology

## 2011-09-17 VITALS — BP 106/64 | HR 72 | Ht 60.0 in | Wt 103.0 lb

## 2011-09-17 DIAGNOSIS — K648 Other hemorrhoids: Secondary | ICD-10-CM

## 2011-09-17 DIAGNOSIS — R198 Other specified symptoms and signs involving the digestive system and abdomen: Secondary | ICD-10-CM

## 2011-09-17 DIAGNOSIS — K589 Irritable bowel syndrome without diarrhea: Secondary | ICD-10-CM

## 2011-09-17 DIAGNOSIS — K5901 Slow transit constipation: Secondary | ICD-10-CM

## 2011-09-17 DIAGNOSIS — K219 Gastro-esophageal reflux disease without esophagitis: Secondary | ICD-10-CM

## 2011-09-17 DIAGNOSIS — Z8719 Personal history of other diseases of the digestive system: Secondary | ICD-10-CM

## 2011-09-17 MED ORDER — MESALAMINE 1000 MG RE SUPP: 1000.0000 mg | Freq: Every day | RECTAL | Status: DC

## 2011-09-17 NOTE — Progress Notes (Signed)
This is a 57 year old Caucasian female with chronic IBS, constipation predominant. She is currently doing much better on daily Metamucil, liberal by mouth fluids, MiraLax at bedtime, and Linzess 145 mcg a day. Her acid reflux is well controlled with Dexilant 60 mg a day. Currently she is having up to 3 regular bowel movements a day without melena or hematochezia. Last endoscopy and colonoscopy were 2 years ago. She currently complains of pressure and discomfort in her rectal area without rectal bleeding. The patient has a suspected GI motility disorder, and is on domperidone 10 mg at bedtime.  Current Medications, Allergies, Past Medical History, Past Surgical History, Family History and Social History were reviewed in [employer] record.  Pertinent Review of Systems Negative   Physical Exam: Healthy-appearing patient in no distress. Blood pressure 106/64, pulse 72 and regular, and weight 103 pounds with BMI of 20.12. Her abdomen shows no organomegaly, masses or tenderness. Bowel sounds are normal. I cannot appreciate a succussion splash. Inspection the rectum is generally unremarkable.  Anoscopy: 4 quadrant anoscopic exam completed without difficulty. This patient does have a posterior lateral and posterior medial internal hemorrhoidal column which does not appear inflamed or bleeding. I cannot appreciate any fissures or fistulae or mucosal lesions. Digital exam otherwise was unremarkable, and stool is guaiac-negative.  Assessment and Plan: GI motility disorder with associated gastroparesis, and colonic dysmotility and associated slow transit constipation. She is much better on the above-mentioned regime. She appear to have some pressure in her rectum related to her internal hemorrhoids, and I have prescribed Canasa 1 g suppositories at bedtime. She relates a previous Anusol-HC suppositories resulted in rectal itching. She is to call in one week's time if not improved. Also to  continue Metamucil and liberal by mouth fluids as tolerated. No diagnosis found.

## 2011-09-17 NOTE — Patient Instructions (Addendum)
We have given you samples of the following medication to take: Canasa suppositories,  Insert one rectally every night. Also continue all of your other medications. Call us in a week with an update on how you are doing.  Hemorrhoids Hemorrhoids are enlarged (dilated) veins around the rectum. There are 2 types of hemorrhoids, and the type of hemorrhoid is determined by its location. Internal hemorrhoids occur in the veins just inside the rectum.They are usually not painful, but they may bleed.However, they may poke through to the outside and become irritated and painful. External hemorrhoids involve the veins outside the anus and can be felt as a painful swelling or hard lump near the anus.They are often itchy and may crack and bleed. Sometimes clots will form in the veins. This makes them swollen and painful. These are called thrombosed hemorrhoids. CAUSES Causes of hemorrhoids include:  Pregnancy. This increases the pressure in the hemorrhoidal veins.   Constipation.   Straining to have a bowel movement.   Obesity.   Heavy lifting or other activity that caused you to strain.  TREATMENT Most of the time hemorrhoids improve in 1 to 2 weeks. However, if symptoms do not seem to be getting better or if you have a lot of rectal bleeding, your caregiver may perform a procedure to help make the hemorrhoids get smaller or remove them completely.Possible treatments include:  Rubber band ligation. A rubber band is placed at the base of the hemorrhoid to cut off the circulation.   Sclerotherapy. A chemical is injected to shrink the hemorrhoid.   Infrared light therapy. Tools are used to burn the hemorrhoid.   Hemorrhoidectomy. This is surgical removal of the hemorrhoid.  HOME CARE INSTRUCTIONS   Increase fiber in your diet. Ask your caregiver about using fiber supplements.   Drink enough water and fluids to keep your urine clear or pale yellow.   Exercise regularly.   Go to the bathroom  when you have the urge to have a bowel movement. Do not wait.   Avoid straining to have bowel movements.   Keep the anal area dry and clean.   Only take over-the-counter or prescription medicines for pain, discomfort, or fever as directed by your caregiver.  If your hemorrhoids are thrombosed:  Take warm sitz baths for 20 to 30 minutes, 3 to 4 times per day.   If the hemorrhoids are very tender and swollen, place ice packs on the area as tolerated. Using ice packs between sitz baths may be helpful. Fill a plastic bag with ice. Place a towel between the bag of ice and your skin.   Medicated creams and suppositories may be used or applied as directed.   Do not use a donut-shaped pillow or sit on the toilet for long periods. This increases blood pooling and pain.  SEEK MEDICAL CARE IF:   You have increasing pain and swelling that is not controlled with your medicine.   You have uncontrolled bleeding.   You have difficulty or you are unable to have a bowel movement.   You have pain or inflammation outside the area of the hemorrhoids.   You have chills or an oral temperature above 102 F (38.9 C).  MAKE SURE YOU:   Understand these instructions.   Will watch your condition.   Will get help right away if you are not doing well or get worse.  Document Released: 12/19/1999 Document Revised: 12/10/2010 Document Reviewed: 04/25/2007 [facility] Patient Information 2012 [place], [place].

## 2011-09-24 ENCOUNTER — Telehealth: Payer: Self-pay | Admitting: Gastroenterology

## 2011-09-24 MED ORDER — MESALAMINE 1000 MG RE SUPP: 1000.0000 mg | Freq: Every day | RECTAL | Status: DC

## 2011-09-24 NOTE — Telephone Encounter (Signed)
Yes qhs prn

## 2011-09-24 NOTE — Telephone Encounter (Signed)
Pt saw Dr Jarold Motto, last week and he prescribed Canasa QHS. She reports her hemorrhoids are better, but not completely healed. She has been soaking in a tub and using an ice pack, but she's been on her feet all week with work. She was to call today with an update; any orders- continue Canasa? Thanks.

## 2011-09-24 NOTE — Telephone Encounter (Signed)
lmom for pt earlier to call back. Called back again and left her a message that I ordered the Canasa and that Dr Jarold Motto stated to continue to use the Canasa when needed.

## 2011-09-27 ENCOUNTER — Telehealth: Payer: Self-pay | Admitting: Gastroenterology

## 2011-09-27 NOTE — Telephone Encounter (Signed)
Pt just wanted to know where I called the Canasa to; explained I called it to [employer] [facility] in [place]. Pt will let me know if the call/med did not go thru.

## 2011-09-27 NOTE — Telephone Encounter (Signed)
Informed pt that I called in her CANASA, but K Daiva Nakayama will have to order it; pt stated understanding.

## 2011-09-28 ENCOUNTER — Ambulatory Visit: Payer: Self-pay | Admitting: Gastroenterology

## 2011-09-28 ENCOUNTER — Telehealth: Payer: Self-pay | Admitting: Gastroenterology

## 2011-09-28 MED ORDER — MESALAMINE 1000 MG RE SUPP: 1000.0000 mg | Freq: Every day | RECTAL | Status: DC

## 2011-09-28 NOTE — Telephone Encounter (Signed)
Pt reports 30 Canasa suppositories will cost her $650 and she can't afford that. She had one sample left and when she tried to use it last night, it was very hard to insert she thinks maybe she was more swollen. She is better though and doesn't want to get any worse like before and wants to know if we have any samples. Left 6 suppositories at the front desk.

## 2011-10-13 ENCOUNTER — Telehealth: Payer: Self-pay | Admitting: Gastroenterology

## 2011-10-13 MED ORDER — LINACLOTIDE 145 MCG PO CAPS: 145.0000 ug | ORAL_CAPSULE | Freq: Every day | ORAL | Status: DC

## 2011-10-13 NOTE — Telephone Encounter (Signed)
Pt states she only has 3 pills left of the Linzess. Pt is calling for more samples. Samples left up front for pick-up. Pt also states she is having some problems with her hemorrhoids. States she is some better but she cannot afford the canasa. Pt instructed to use tucks pads and some otc suppositories. Pt to call if problems continue.Pt verbalized understanding.

## 2011-10-21 ENCOUNTER — Telehealth: Payer: Self-pay | Admitting: Gastroenterology

## 2011-10-21 NOTE — Telephone Encounter (Signed)
Pt reports she can have good days as far as having BMs, with Linzess, Miralax and Metamucil and then not have a BM or have to strain which causes her back and thighs to hurt. Told her I think that's why she's dx with IBS. She feels as though she has to have a BM everyday; states she feels better. We spoke about increasing her fluids and perhaps taking a stool softener to help with this. Pt wants to know if it's ok to add a stool softener with the other meds. Also informed her of the [facility] that deals with pelvic floor dysfunction as well as the current research IBS study; she will think about these. Dr Jarold Motto, [facility] for pt to take stools softeners as well as Linzess, Metamucil and Miralax? Thanks.

## 2011-10-22 NOTE — Telephone Encounter (Signed)
Informed pt it's OK to use stools softeners with her other meds. We can make a referral to [facility] if she would like. She will use the stool softeners and increase her liquid intake and see if that helps. For now, she will hold the referral.

## 2011-10-22 NOTE — Telephone Encounter (Signed)
Yes and [facility] referral  Good idea,,,,

## 2011-11-01 ENCOUNTER — Telehealth: Payer: Self-pay | Admitting: Gastroenterology

## 2011-11-01 DIAGNOSIS — R197 Diarrhea, unspecified: Secondary | ICD-10-CM

## 2011-11-01 NOTE — Telephone Encounter (Signed)
Informed pt she needs to bring in a stool sample for CDIFF. Does pt stop Linzess, Miralax and/or Metamucil? Thanks.

## 2011-11-01 NOTE — Telephone Encounter (Signed)
Stop all till diarrhea resolved

## 2011-11-01 NOTE — Telephone Encounter (Signed)
Per previous encounter, pt reports the pelvic floor and back pain was d/t a "kidney infection" and she was placed on Cipro for 7 days. Now, she reports diarrhea and painful hemorrhoids which bled this am; BRB on toilet paper. She asked her PCP who told her to stop the Metamucil and Miralax. She wants to know what to do; she remains on Linzess daily, but should she stay on metamucil to add bulk to her stools? Thanks,

## 2011-11-01 NOTE — Telephone Encounter (Signed)
Informed pt to stop  Linzess, Miralax and Metamucil until diarrhea resolves.

## 2011-11-01 NOTE — Telephone Encounter (Signed)
If she is having diarrhea, she needs to be checked for C. difficile. She has chronic functional constipation and requires daily constipation regime. I cannot diagnose over the phone her new onset complaints.

## 2011-11-03 ENCOUNTER — Telehealth: Payer: Self-pay | Admitting: Gastroenterology

## 2011-11-03 ENCOUNTER — Other Ambulatory Visit: Payer: Self-pay

## 2011-11-03 DIAGNOSIS — R197 Diarrhea, unspecified: Secondary | ICD-10-CM

## 2011-11-03 MED ORDER — DICYCLOMINE HCL 10 MG PO CAPS: 10.0000 mg | ORAL_CAPSULE | Freq: Three times a day (TID) | ORAL | Status: DC

## 2011-11-03 NOTE — Telephone Encounter (Signed)
Pt reports her nausea is better and she turned in her stool sample this am. She is still having 4-5 loose stools a day and cramping in her abdomen; the stools have decreased from 5-6/day. She is still holding the Linzess, Miralax and Metamucil. Spoke with Dr Jarold Motto and OK to order Levsin, but there was a contraindication with Levsin and Clidinium, so we ordered Bentyl.

## 2011-11-04 LAB — CLOSTRIDIUM DIFFICILE BY PCR: Toxigenic C. Difficile by PCR: NOT DETECTED

## 2011-11-05 ENCOUNTER — Telehealth: Payer: Self-pay | Admitting: Gastroenterology

## 2011-11-05 DIAGNOSIS — R197 Diarrhea, unspecified: Secondary | ICD-10-CM

## 2011-11-05 NOTE — Telephone Encounter (Signed)
Informed pt of negative CDIFF; she wants to know what to do now. She reports she still has diarrhea and her hemorrhoids are really inflamed. She reports she cannot afford Canasa and the hydrocortisone irritated her bottom.  She wants to know if she should add the Metamucil back for bulk, I want to know if you want to add OTC Imodium? Please advise. Thanks.

## 2011-11-08 ENCOUNTER — Telehealth: Payer: Self-pay | Admitting: Gastroenterology

## 2011-11-08 NOTE — Telephone Encounter (Signed)
Imodium ab nd prn desitin cream

## 2011-11-08 NOTE — Telephone Encounter (Signed)
See encounter for 11/05/11.

## 2011-11-08 NOTE — Telephone Encounter (Signed)
Informed pt to bring in a stool sample for a culture; pt stated understanding.

## 2011-11-08 NOTE — Telephone Encounter (Signed)
Stool culture is ok.Ruth KitchenMarland Terry

## 2011-11-08 NOTE — Telephone Encounter (Signed)
Called pt this am and apologized for not calling on 11/05/11, but Dr Jarold Motto left early and I did not know it. She reports over the weekend, beginning Friday, she had a normal BM. From Friday night on, she alternated between diarrhea, constipation and normal BMs. She reports she is weak and chilled and knows she has infection in some part of her body; states she was fine until she took Cipro.  She asked if Dr Jarold Motto would order some blood work and stool tests? Spoke with Dr Jarold Motto and he stated he has done all he knows to do and maybe she should see someone else; he suggested Dr Merri Brunette at [facility]. Informed pt who stated she is very hurt and all she wants is some lab work done; if nothing is found she will take the referral. She truly believes she has a stomach bug. Dr Jarold Motto, order labs and stool studies? Thanks.

## 2011-11-09 ENCOUNTER — Telehealth: Payer: Self-pay | Admitting: Gastroenterology

## 2011-11-09 NOTE — Telephone Encounter (Signed)
Pt wanted info on cream.

## 2011-11-10 ENCOUNTER — Other Ambulatory Visit: Payer: Self-pay

## 2011-11-10 DIAGNOSIS — R197 Diarrhea, unspecified: Secondary | ICD-10-CM

## 2011-11-12 ENCOUNTER — Telehealth: Payer: Self-pay | Admitting: Gastroenterology

## 2011-11-12 NOTE — Telephone Encounter (Signed)
Informed pt the stool culture was negative. She states she is better. She started back on Metamucil and her stools are firmer. Pt states the Cipro just screwed up her system. Pt will call back for problems.

## 2011-11-14 LAB — STOOL CULTURE

## 2011-12-06 ENCOUNTER — Telehealth: Payer: Self-pay | Admitting: Gastroenterology

## 2011-12-06 NOTE — Telephone Encounter (Signed)
Pt called for samples of Linzess 145 mcg. Informed her we are out, but perhaps the drug rep will come this week; will call her.

## 2011-12-07 ENCOUNTER — Other Ambulatory Visit: Payer: Self-pay | Admitting: *Deleted

## 2011-12-07 DIAGNOSIS — K3184 Gastroparesis: Secondary | ICD-10-CM

## 2011-12-07 MED ORDER — DEXLANSOPRAZOLE 60 MG PO CPDR: 60.0000 mg | DELAYED_RELEASE_CAPSULE | Freq: Every day | ORAL | Status: DC

## 2011-12-07 MED ORDER — LINACLOTIDE 145 MCG PO CAPS: 145.0000 ug | ORAL_CAPSULE | Freq: Every day | ORAL | Status: DC

## 2011-12-07 NOTE — Telephone Encounter (Signed)
Informed pt I can give her 20 caps of Linzess.

## 2011-12-08 ENCOUNTER — Telehealth: Payer: Self-pay | Admitting: Gastroenterology

## 2011-12-08 NOTE — Telephone Encounter (Signed)
Informed pt I left her samples up front.

## 2011-12-10 ENCOUNTER — Ambulatory Visit: Payer: Self-pay | Admitting: Gastroenterology

## 2011-12-22 ENCOUNTER — Telehealth: Payer: Self-pay | Admitting: Gastroenterology

## 2011-12-22 NOTE — Telephone Encounter (Signed)
Pt called to discuss increased gas and rectal bleeding. Pt had been on a regime of Linzess, Miralax, Metamucil for constipation. She stopped for problems and has tried to add back Metamucil, but has terrible gas. Explained she probably hasn't given her system long enough to get used to it, but she can try Metamucil for a longer period or switch to Benefiber. Pt stated understanding. She also reports rectal bleeding on tissue and in bowl for a couple of days. She is having soft stools so it may not be hemorrhoids. She will try her Canasa suppositories to see if the bleeding is better and give [place] an update.

## 2011-12-22 NOTE — Telephone Encounter (Signed)
Called patient back and she said that she will feel better talking to Aram Beecham, Dr. Norval Gable nurse because she understands her situation better.  Patient said that she was also in need of Linzess samples because she has no health insurance and is working on getting some health insurance first of the year. I advised to patient that we cant guarantee samples every month because at times we will not have and she may have to take something over the counter. Patient stated that nothing over the counter helps her. Advised patient that I can leave some samples, will try to do enough to keep her until January. Patient said in January she will be making a follow up appointment with Dr. Jarold Motto because she will have insurance. Advised patient that I will leave samples out front of what we have and will have Aram Beecham call her back.  Patient verbalized understanding and stated that she will pick samples up on Monday.  ___________________________________________________________________________________________________________________  (Patient has been given samples on numerous occassions of Linzess and patient cancelled two follow up appointments with Dr. Jarold Motto)

## 2012-01-19 ENCOUNTER — Telehealth: Payer: Self-pay | Admitting: Gastroenterology

## 2012-01-19 ENCOUNTER — Telehealth: Payer: Self-pay | Admitting: *Deleted

## 2012-01-19 MED ORDER — LINACLOTIDE 145 MCG PO CAPS: 145.0000 ug | ORAL_CAPSULE | Freq: Every day | ORAL | Status: DC

## 2012-01-19 NOTE — Telephone Encounter (Signed)
I called patient back and advised patient that we do not have a lot of samples to give and we are not getting any in like we use to.  Patient stated that she will definitely have health insurance come February 1 and we can send a prescription then.

## 2012-01-19 NOTE — Telephone Encounter (Signed)
Left message for patient to call me back. 

## 2012-01-25 ENCOUNTER — Telehealth: Payer: Self-pay | Admitting: Gastroenterology

## 2012-01-25 DIAGNOSIS — K3184 Gastroparesis: Secondary | ICD-10-CM

## 2012-01-25 MED ORDER — LINACLOTIDE 145 MCG PO CAPS: 145.0000 ug | ORAL_CAPSULE | Freq: Every day | ORAL | Status: DC

## 2012-01-25 MED ORDER — DEXLANSOPRAZOLE 60 MG PO CPDR: 60.0000 mg | DELAYED_RELEASE_CAPSULE | Freq: Every day | ORAL | Status: DC

## 2012-01-25 NOTE — Telephone Encounter (Signed)
Left message on home phone and mobile phone for patient to call back  Prescriptions left up front

## 2012-01-25 NOTE — Telephone Encounter (Signed)
Printed prescription per patient request.

## 2012-02-09 ENCOUNTER — Telehealth: Payer: Self-pay | Admitting: *Deleted

## 2012-02-09 NOTE — Telephone Encounter (Signed)
Called patients insurance company [employer] at [phone] and spoke with Uvaldo Rising.  Per Eber Jones they fax a form for prior authorization and after we fax it back it will take 24-72 hours for review and a response back from the insurance company about approval.  I filled form for prior authorization and faxed backed today.

## 2012-02-21 ENCOUNTER — Telehealth: Payer: Self-pay | Admitting: *Deleted

## 2012-02-21 NOTE — Telephone Encounter (Signed)
I called patient to let her know that her Dexilant was approved.  Patient said that she was glad I called because she has not been feeling well.   Patient said that she went to her primary care doctor today because she has been feeling bad with nausea, vomiting and diarrhea.  Patient stated that her primary care doctor gave her Carafate and told her to start a bland diet. Patient wanted suggestions from Dr. Jarold Motto or his nurse about what else could she do or if she is not better will she need an office visit appointment.  Patient said vomiting makes GERD worse and diarrhea is making hemorrhoids and IBS worse.  I told patient that I will pass message on to Dr Norval Gable nurse and she will get a call back.

## 2012-02-22 NOTE — Telephone Encounter (Signed)
Pt reports she got sick last Thursday and developed vomiting and diarrhea that has upset her whole GI system. Friday, she was so bad she called EMS who told her she was not dehydrated and she may get something worse if she went to the ER. She has followed a BRAT diet since and she saw her PCP yesterday. She received a Prednisone injection and he ordered Carafate qid. She no longer has n/v, diarrhea , in fact she needs to have a BM. She wanted suggestions to get back to normal. Advised her to continue the Carafate to coat her esophagus and throat. Make sure she takes the Align and she may want to add yorgurt to her diet. She should also advance her diet slowly. She may want to restart Miralax up to BID, take her Metamucil and Linzess to prevent constipation; she can always stop the Miralax after she's "regular" again. Pt will call back if no better.

## 2012-02-25 ENCOUNTER — Telehealth: Payer: Self-pay | Admitting: Gastroenterology

## 2012-02-25 NOTE — Telephone Encounter (Signed)
Pt called with an update; her reflux is better, no diarrhea or vomiting, but has has terrible gas pains. She is taking GAS X with some relief, but wants to know if there's anything else she can do and is the CARAFATE the problem? Looked in our resources and informed her I can't find any reference of increased flatulence with Carafate. She states it's really helping her reflux so she will continue to take it. If no better, she will call back next week.

## 2012-04-12 ENCOUNTER — Other Ambulatory Visit: Payer: Self-pay | Admitting: *Deleted

## 2012-04-12 MED ORDER — LINACLOTIDE 145 MCG PO CAPS: 145.0000 ug | ORAL_CAPSULE | Freq: Every day | ORAL | Status: DC

## 2012-04-13 ENCOUNTER — Telehealth: Payer: Self-pay | Admitting: Physician Assistant

## 2012-04-13 NOTE — Telephone Encounter (Signed)
Larey Seat last week and developed rib pain a few days later.  Also c/o head congestion but no cough.  She has had pneumonia in the past and doesn't feel that is the case with this episode.  Patient wishes to give it a few more days and if symptoms do not improve she will make an appt.  Suggested ice or heat. Whichever feels better.  Can also take OTC NSAID if she doesn't have any contraindications.

## 2012-05-05 ENCOUNTER — Telehealth: Payer: Self-pay | Admitting: Gastroenterology

## 2012-05-05 MED ORDER — AMBULATORY NON FORMULARY MEDICATION: Status: DC

## 2012-05-05 MED ORDER — HYDROCORTISONE ACETATE 25 MG RE SUPP: 25.0000 mg | Freq: Two times a day (BID) | RECTAL | Status: DC

## 2012-05-05 NOTE — Telephone Encounter (Signed)
Pt reports pressure and swelling of her hemorrhoids. She was using an OTC meds , recti medicone, but it is no longer available. Asked Dr Jarold Motto and I can order Anusol- done,  to [facility]. She also asked for Domperidone refill and informed her we now use [employer]; ordered.

## 2012-05-05 NOTE — Telephone Encounter (Signed)
Patient is having problems with her hemorrhoids.  The over the counter medicine she usually uses is no longer available.  Please advise.

## 2012-05-17 ENCOUNTER — Telehealth: Payer: Self-pay | Admitting: Gastroenterology

## 2012-05-17 NOTE — Telephone Encounter (Signed)
Pt and I spoke of her hemorrhoidal pain. She has been under a lot os stress lately and she thinks the internal ones are causing pressure , back and leg pain. She has almost used all the anusol. Looked at other suppositories she may use and found samples of Calmol 4 which are OTC. She cannot get them until Monday; she will try to get some rest and get off her feet and call later. She may ask her pharmacist if they can order the drug.

## 2012-07-03 ENCOUNTER — Telehealth: Payer: Self-pay | Admitting: Gastroenterology

## 2012-07-04 NOTE — Telephone Encounter (Signed)
Late entry  07/03/12   Pt c/o rectal/ pelvic pain "when she is making her BM"; as soon as she has the BM the pain is gone. After much discussion, we decided she would try 1-2 stool softeners daily even though she takes Linzess. This is to aid in passage of the stool because she states she has a tortuous colon.

## 2012-07-26 ENCOUNTER — Telehealth: Payer: Self-pay | Admitting: Gastroenterology

## 2012-07-26 NOTE — Telephone Encounter (Signed)
Pt reports she is out of Domperidone. We had ordered the drug from [facility], but pt never called for the script. I called them and they will fill the script next week when Amada Jupiter, the compounding pharmacist returns. Pt stated understanding.

## 2012-07-26 NOTE — Telephone Encounter (Signed)
lmom for pt to call me back

## 2012-07-31 ENCOUNTER — Telehealth: Payer: Self-pay | Admitting: Gastroenterology

## 2012-07-31 MED ORDER — AMBULATORY NON FORMULARY MEDICATION: Status: DC

## 2012-07-31 NOTE — Telephone Encounter (Signed)
lmom for pt to call back

## 2012-07-31 NOTE — Telephone Encounter (Signed)
Informed pt we can now order her Domperidone thru [facility]; pt stated understanding and irder was faxed to [phone]

## 2012-12-07 ENCOUNTER — Emergency Department (HOSPITAL_COMMUNITY)
Admission: EM | Admit: 2012-12-07 | Discharge: 2012-12-07 | Disposition: A | Discharge status: Home / Self Care | Payer: BC Managed Care – PPO | Attending: Emergency Medicine | Admitting: Emergency Medicine

## 2012-12-07 ENCOUNTER — Encounter (HOSPITAL_COMMUNITY): Payer: Self-pay | Admitting: Emergency Medicine

## 2012-12-07 DIAGNOSIS — R110 Nausea: Secondary | ICD-10-CM | POA: Insufficient documentation

## 2012-12-07 DIAGNOSIS — Z8679 Personal history of other diseases of the circulatory system: Secondary | ICD-10-CM | POA: Insufficient documentation

## 2012-12-07 DIAGNOSIS — K219 Gastro-esophageal reflux disease without esophagitis: Secondary | ICD-10-CM | POA: Insufficient documentation

## 2012-12-07 DIAGNOSIS — R197 Diarrhea, unspecified: Secondary | ICD-10-CM | POA: Insufficient documentation

## 2012-12-07 DIAGNOSIS — F17200 Nicotine dependence, unspecified, uncomplicated: Secondary | ICD-10-CM | POA: Insufficient documentation

## 2012-12-07 DIAGNOSIS — E785 Hyperlipidemia, unspecified: Secondary | ICD-10-CM | POA: Insufficient documentation

## 2012-12-07 DIAGNOSIS — Z87440 Personal history of urinary (tract) infections: Secondary | ICD-10-CM | POA: Insufficient documentation

## 2012-12-07 DIAGNOSIS — K589 Irritable bowel syndrome without diarrhea: Secondary | ICD-10-CM | POA: Insufficient documentation

## 2012-12-07 DIAGNOSIS — R109 Unspecified abdominal pain: Secondary | ICD-10-CM | POA: Insufficient documentation

## 2012-12-07 DIAGNOSIS — Z79899 Other long term (current) drug therapy: Secondary | ICD-10-CM | POA: Insufficient documentation

## 2012-12-07 HISTORY — DX: Urinary tract infection, site not specified: N39.0

## 2012-12-07 LAB — COMPREHENSIVE METABOLIC PANEL
ALT: 11 U/L (ref 0–35)
AST: 18 U/L (ref 0–37)
Albumin: 3.9 g/dL (ref 3.5–5.2)
Alkaline Phosphatase: 69 U/L (ref 39–117)
Chloride: 102 mEq/L (ref 96–112)
Creatinine, Ser: 0.71 mg/dL (ref 0.50–1.10)
Potassium: 3.5 mEq/L (ref 3.5–5.1)
Total Bilirubin: 0.3 mg/dL (ref 0.3–1.2)
Total Protein: 7.2 g/dL (ref 6.0–8.3)

## 2012-12-07 LAB — URINALYSIS, ROUTINE W REFLEX MICROSCOPIC
Bilirubin Urine: NEGATIVE
Glucose, UA: NEGATIVE mg/dL
Hgb urine dipstick: NEGATIVE
Ketones, ur: NEGATIVE mg/dL
Urobilinogen, UA: 0.2 mg/dL (ref 0.0–1.0)
pH: 6.5 (ref 5.0–8.0)

## 2012-12-07 LAB — CBC WITH DIFFERENTIAL/PLATELET
Basophils Absolute: 0 10*3/uL (ref 0.0–0.1)
Basophils Relative: 0 % (ref 0–1)
Hemoglobin: 15.1 g/dL — ABNORMAL HIGH (ref 12.0–15.0)
Lymphocytes Relative: 16 % (ref 12–46)
MCHC: 34.6 g/dL (ref 30.0–36.0)
MCV: 93.6 fL (ref 78.0–100.0)
Neutro Abs: 4.2 10*3/uL (ref 1.7–7.7)
Neutrophils Relative %: 74 % (ref 43–77)
RBC: 4.66 MIL/uL (ref 3.87–5.11)
RDW: 13.6 % (ref 11.5–15.5)
WBC: 5.6 10*3/uL (ref 4.0–10.5)

## 2012-12-07 LAB — URINE MICROSCOPIC-ADD ON

## 2012-12-07 LAB — LIPASE, BLOOD: Lipase: 35 U/L (ref 11–59)

## 2012-12-07 MED ORDER — HYDROCODONE-ACETAMINOPHEN 5-325 MG PO TABS: 1.0000 | ORAL_TABLET | Freq: Four times a day (QID) | ORAL | Status: DC | PRN

## 2012-12-07 MED ORDER — HYDROCODONE-ACETAMINOPHEN 5-325 MG PO TABS
1.0000 | ORAL_TABLET | Freq: Once | ORAL | Status: AC
  Administered 2012-12-07: 1 via ORAL
  Filled 2012-12-07: qty 1

## 2012-12-07 MED ORDER — MORPHINE SULFATE 4 MG/ML IJ SOLN
4.0000 mg | Freq: Once | INTRAMUSCULAR | Status: DC
  Filled 2012-12-07: qty 1

## 2012-12-07 MED ORDER — ONDANSETRON HCL 4 MG/2ML IJ SOLN
4.0000 mg | Freq: Once | INTRAMUSCULAR | Status: AC
  Administered 2012-12-07: 4 mg via INTRAMUSCULAR
  Filled 2012-12-07: qty 2

## 2012-12-07 MED ORDER — GI COCKTAIL ~~LOC~~
30.0000 mL | Freq: Once | ORAL | Status: AC
  Administered 2012-12-07: 30 mL via ORAL
  Filled 2012-12-07: qty 30

## 2012-12-07 NOTE — ED Notes (Addendum)
abd pain , nausea, no vomiting, has diarrhea that pt says is not unusual.    No fever.   Taking an antibiotic for UTI  For 2 days, says her MD said she is not emptying her bladder.

## 2012-12-07 NOTE — ED Provider Notes (Signed)
CSN: [id number]     Arrival date & time 12/07/12  1145 History  This chart was scribed for Shon Baton, MD by Quintella Reichert, ED scribe.  This patient was seen in room APA08/APA08 and the patient's care was started at 12:33 PM.   Chief Complaint  Patient presents with  . Abdominal Pain    The history is provided by the patient. No language interpreter was used.    HPI Comments: Ruth Terry is a 57 y.o. female with h/o IBS, gastroparesis, and diverticulosis who presents to the Emergency Department complaining of progressively-worsening abdominal pain that began 2-3 days ago.  Pain is described as aching and does not radiate.  Presently she rates pain at 6/10.  It is not worsened or relieved by anything.  She has not taken pain medication pta.  Pain is associated with some mild nausea.  She denies vomiting.  She had some diarrhea yesterday but she has h/o IBS and states this is not unusual for her.  She denies blood in stool.  She denies recent sick contacts.  She is eating normally.  Pt notes that in September she was diagnosed with a UTI which was not relieved by antibiotics.  A later repeat US revealed that her "bladder was not emptying completely" and she was started an a new medication 2 days ago which she cannot remember the name of.  However she states that her pain began before she started taking this medication.  Pt denies h/o abdominal surgery.   Past Medical History  Diagnosis Date  . MVP (mitral valve prolapse)   . IBS (irritable bowel syndrome)   . Hemorrhoids   . Hyperlipidemia   . GERD (gastroesophageal reflux disease)   . Gastroparesis   . Polyp, stomach 04-20-10    egd  . Diverticulosis 04-20-10    colonoscopy  . UTI (lower urinary tract infection)     History reviewed. No pertinent past surgical history.   Family History  Problem Relation Age of Onset  . Hypertension Mother   . Diabetes Father   . Heart attack Father     Age 58    History  Substance  Use Topics  . Smoking status: Current Some Day Smoker -- 0.50 packs/day for 6 years    Types: Cigarettes  . Smokeless tobacco: Current User     Comment: USE SOMKELESS CIGARETTES  . Alcohol Use: 0.0 oz/week     Comment: 1 a week    OB History   Grav Para Term Preterm Abortions TAB SAB Ect Mult Living                  Review of Systems  Constitutional: Negative for fever.  Respiratory: Negative for chest tightness and shortness of breath.   Cardiovascular: Negative for chest pain.  Gastrointestinal: Positive for nausea, abdominal pain and diarrhea (chronic). Negative for vomiting and blood in stool.  Genitourinary: Negative for dysuria.  All other systems reviewed and are negative.     Allergies  Chlordiazepoxide-clidinium; Ciprofloxacin; Sulfa drugs cross reactors; and Nitrofurantoin monohyd macro  Home Medications   Current Outpatient Rx  Name  Route  Sig  Dispense  Refill  . AMBULATORY NON FORMULARY MEDICATION      Domperidone 10mg  take one at bedtime   100 tablet   3     This med will be ordered from [facility]. Tel ...   . dexlansoprazole (DEXILANT) 60 MG capsule   Oral   Take 1 capsule (  60 mg total) by mouth daily.   90 capsule   3   . Linaclotide (LINZESS) 145 MCG CAPS   Oral   Take 1 capsule (145 mcg total) by mouth daily.   90 capsule   1   . Probiotic Product (ALIGN) 4 MG CAPS   Oral   Take 1 capsule by mouth daily.         . psyllium (METAMUCIL) 58.6 % packet   Oral   Take 1 packet by mouth daily.         Marland Kitchen HYDROcodone-acetaminophen (NORCO/VICODIN) 5-325 MG per tablet   Oral   Take 1 tablet by mouth every 6 (six) hours as needed for moderate pain.   10 tablet   0    BP 135/81  Pulse 88  Temp(Src) 98.8 F (37.1 C) (Oral)  Resp 20  Ht 5' (1.524 m)  Wt 105 lb (47.628 kg)  BMI 20.51 kg/m2  SpO2 99%  Physical Exam  Nursing note and vitals reviewed. Constitutional: She is oriented to person, place, and time. She appears  well-developed and well-nourished. No distress.  HENT:  Head: Normocephalic and atraumatic.  Eyes: Pupils are equal, round, and reactive to light.  Neck: Neck supple.  Cardiovascular: Normal rate, regular rhythm and normal heart sounds.   No murmur heard. Pulmonary/Chest: Effort normal. No respiratory distress.  Abdominal: Soft. Bowel sounds are normal. She exhibits no distension. There is no tenderness. There is no rebound and no guarding.  Musculoskeletal: She exhibits no edema.  Neurological: She is alert and oriented to person, place, and time.  Skin: Skin is warm and dry.  Psychiatric: She has a normal mood and affect.    ED Course  Procedures (including critical care time)  DIAGNOSTIC STUDIES: Oxygen Saturation is 99% on room air, normal by my interpretation.    COORDINATION OF CARE: 12:38 PM: Discussed treatment plan which includes UA.  Pt expressed understanding and agreed to plan.   Labs Review Labs Reviewed  URINALYSIS, ROUTINE W REFLEX MICROSCOPIC - Abnormal; Notable for the following:    Leukocytes, UA SMALL (*)    All other components within normal limits  URINE MICROSCOPIC-ADD ON - Abnormal; Notable for the following:    Squamous Epithelial / LPF MANY (*)    All other components within normal limits  CBC WITH DIFFERENTIAL - Abnormal; Notable for the following:    Hemoglobin 15.1 (*)    All other components within normal limits  COMPREHENSIVE METABOLIC PANEL - Abnormal; Notable for the following:    Glucose, Bld 102 (*)    All other components within normal limits  LIPASE, BLOOD  LACTIC ACID, PLASMA   Imaging Review No results found.  EKG Interpretation   None       MDM   1. Abdominal pain     This a patient who presents with abdominal pain. She has a history of IBS and gastroparesis. She has no significant vomiting or diarrhea. Her physical exam is unremarkable and she has no peritoneal signs.  Basic labwork is reassuring including a lactate of  1.4. Urinalysis does not show evidence of acute urinary tract infection. CBC and CMP are reassuring. Patient has had some improvement of her pain with morphine.  Patient was given by mouth Norco and a GI cocktail. She is still complaining of mild pain.  She continues to have a benign abdominal exam.  I discussed with the patient that at this time given her benign abdominal exam, I do not feel  she needs further imaging. I would like to try to control her symptoms. Her abdominal pain may be secondary to IBS and/or gastroparesis.  I have offered the patient a CT scan versus monitoring symptoms for 24 hours a repeat abdominal exam by her primary care physician. Patient agrees with symptom control and repeat exam tomorrow. She will return if she has worsening of symptoms, development of fever, nausea, vomiting, or any worsening of symptoms.  After history, exam, and medical workup I feel the patient has been appropriately medically screened and is safe for discharge home. Pertinent diagnoses were discussed with the patient. Patient was given return precautions.  I personally performed the services described in this documentation, which was scribed in my presence. The recorded information has been reviewed and is accurate.    Shon Baton, MD 12/07/12 [phone]

## 2012-12-12 ENCOUNTER — Encounter: Payer: Self-pay | Admitting: Gastroenterology

## 2012-12-12 ENCOUNTER — Ambulatory Visit (INDEPENDENT_AMBULATORY_CARE_PROVIDER_SITE_OTHER): Payer: BC Managed Care – PPO | Admitting: Gastroenterology

## 2012-12-12 VITALS — BP 110/80 | HR 102 | Ht 60.0 in | Wt 104.0 lb

## 2012-12-12 DIAGNOSIS — K3184 Gastroparesis: Secondary | ICD-10-CM

## 2012-12-12 DIAGNOSIS — K5900 Constipation, unspecified: Secondary | ICD-10-CM

## 2012-12-12 DIAGNOSIS — R109 Unspecified abdominal pain: Secondary | ICD-10-CM

## 2012-12-12 NOTE — Progress Notes (Signed)
This is a 57 year old Caucasian female with chronic GI motility disorder manifested with chronic GERD, gastroparesis, chronic functional constipation.  She has recently had increasing lower abdominal pain which also radiates into her right upper quadrant into her back.  She was apparently in the emergency room recently, also saw urologist was placed on Levaquin and antispasmodics.  She denies abuse of codeine or other narcotics.  Despite all these complaints she's had no melena, hematochezia, specific hepatobiliary complaints or systemic complaints such as fever, chills, nausea and vomiting et Karie Soda.  She is on multiple medications including daily Linzess 145 mcg, Dexilant 60 mg a day, and domperidone 10 mg at bedtime.  Her main complaints today is of continued chronic constipation.  Her last endoscopy and colonoscopy were in April of 2012.  Ultrasound upper abdomen that time also was unremarkable.  Review of her laboratory data from recent emergency room shows no elevation liver profile, amylase or lipase.  Current Medications, Allergies, Past Medical History, Past Surgical History, Family History and Social History were reviewed in [employer] record.  ROS: All systems were reviewed and are negative unless otherwise stated in the HPI.          Physical Exam: Healthy appearing patient in no distress.  Blood pressure 110/80, pulse 102 and regular and weight 14 with a BMI of 20.31.  Cannot appreciate stigmata of chronic liver disease.  Chest is clear and she is in a regular rhythm without murmurs gallops or rubs.  Her abdomen is slightly distended but there is no organomegaly, masses or tenderness.  Bowel sounds are normal.  She does have mild scoliosis present.  Mental status is normal.    Assessment and Plan: This patient appears to have diffuse smooth muscle dysfunction with gastric emptying problems, poor bladder emptying, and chronic constipation.  I'm not sure what to make  of her recent lower abdominal pain which may be related to cystitis.  I've suggested to her that we repeat her endoscopy and colonoscopy since it is almost 3 years from her last exam.  She has declined these procedures at this time.  Have set her up for gallbladder ultrasound to exclude cholelithiasis.  She is to continue her medications as prescribed per the emergency room physician and her urologist.  I've advised him not to take narcotics if at all possible.  This patient may need referral to a tertiary Medical Center.  Also she may need high-resolution esophageal manometry for better definition of her smooth muscle dysfunction.  CC: Dr. Rudi Heap in [place], [place]

## 2012-12-12 NOTE — Patient Instructions (Addendum)
It has been recommended to you by your physician that you have a(n) endoscopy/colonoscopy completed. Per your request, we did not schedule the procedure(s) today. Please contact our office at [phone] should you decide to have the procedure completed.  You have been scheduled for an abdominal ultrasound at [facility] (1st floor of hospital) on 12-18-2012 at 9 am. Please arrive 15 minutes prior to your appointment for registration. Make certain not to have anything to eat or drink 6 hours prior to your appointment. Should you need to reschedule your appointment, please contact radiology at [phone]. This test typically takes about 30 minutes to perform.

## 2012-12-18 ENCOUNTER — Ambulatory Visit (HOSPITAL_COMMUNITY)
Admission: RE | Admit: 2012-12-18 | Discharge: 2012-12-18 | Disposition: A | Discharge status: Home / Self Care | Payer: BC Managed Care – PPO | Source: Ambulatory Visit | Attending: Gastroenterology | Admitting: Gastroenterology

## 2012-12-18 DIAGNOSIS — R109 Unspecified abdominal pain: Secondary | ICD-10-CM | POA: Insufficient documentation

## 2012-12-18 IMAGING — US US ABDOMEN COMPLETE
1 series · 14 of 25 positions shown · non-contrast
Comparison: [DATE]

CLINICAL DATA: Abdominal pain

EXAM:
ULTRASOUND ABDOMEN COMPLETE

[Series 1: us abdomen complete · 0.18mm/px · 14 of 76 slices shown]
[im 1/76]
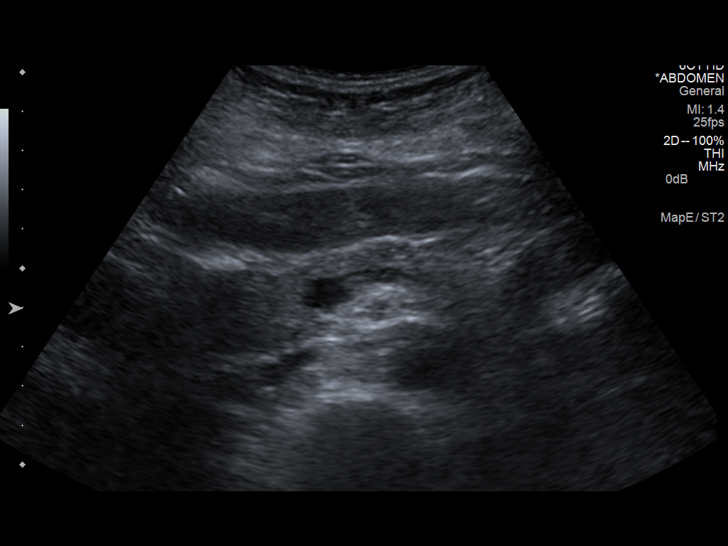
[im 7/76]
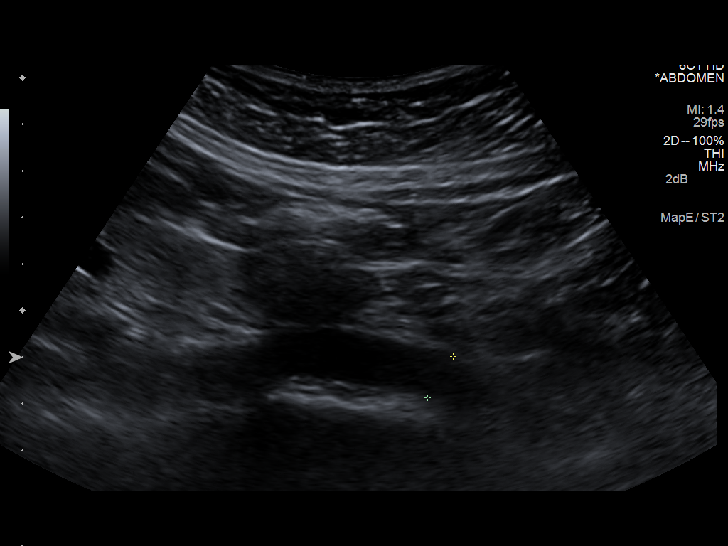
[im 13/76]
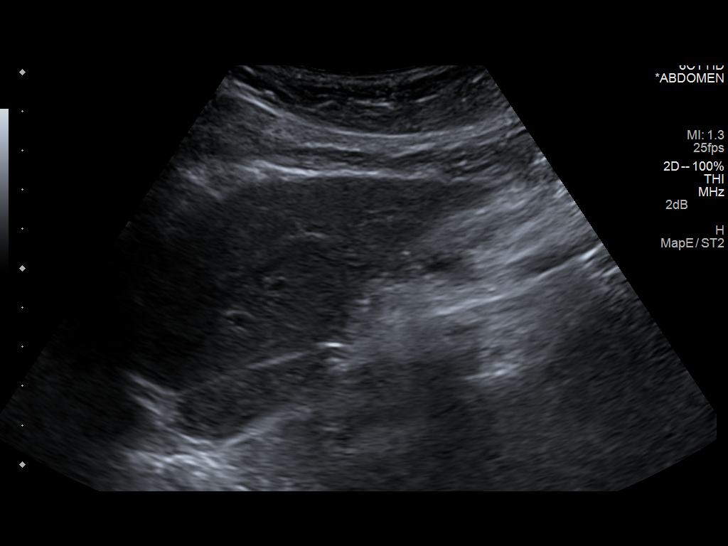
[im 19/76]
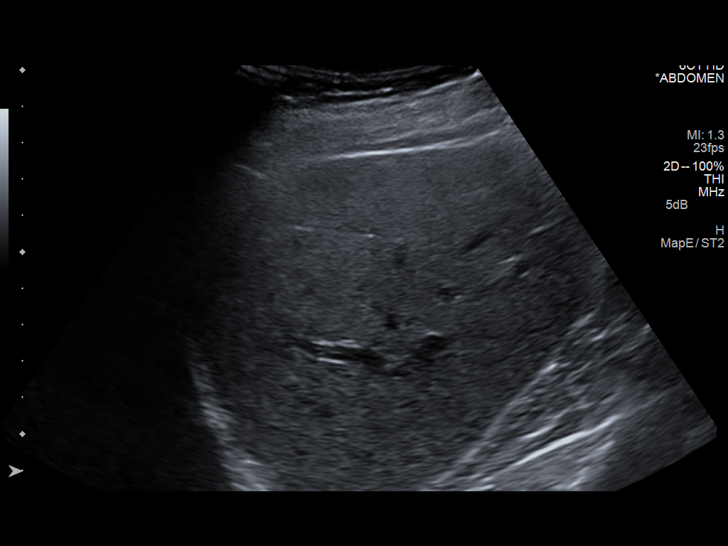
[im 26/76]
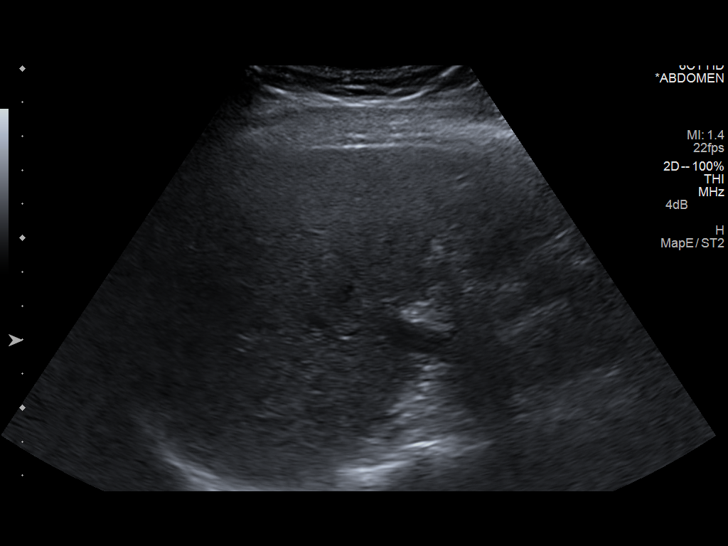
[im 29/76]
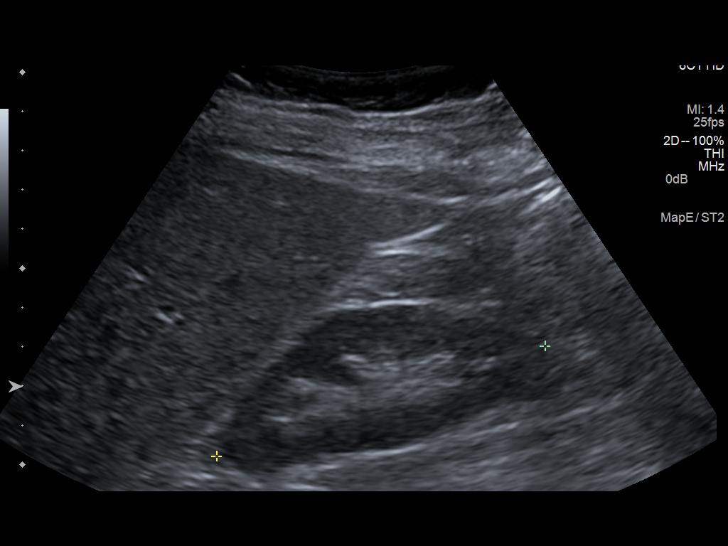
[im 35/76]
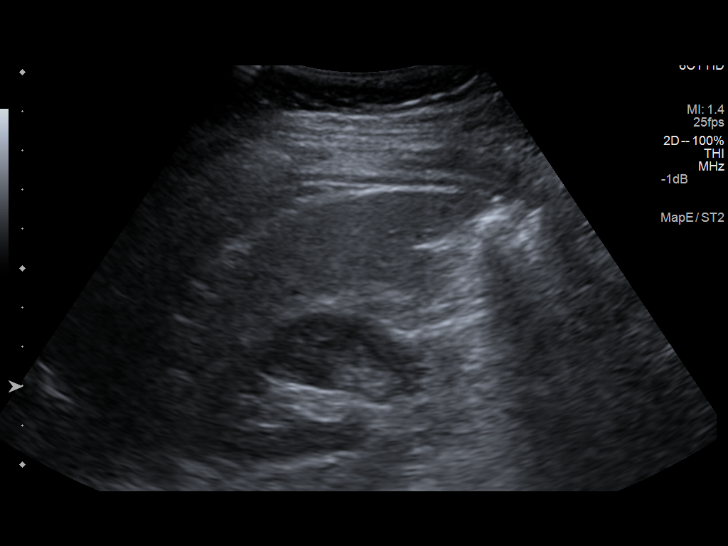
[im 41/76]
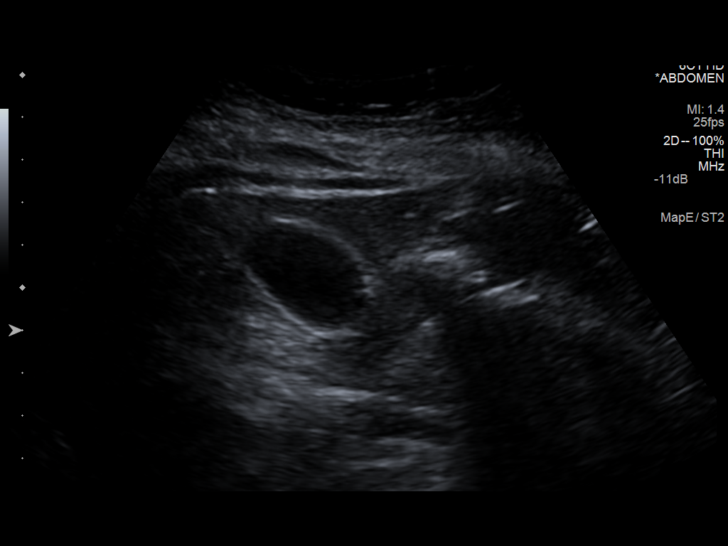
[im 47/76]
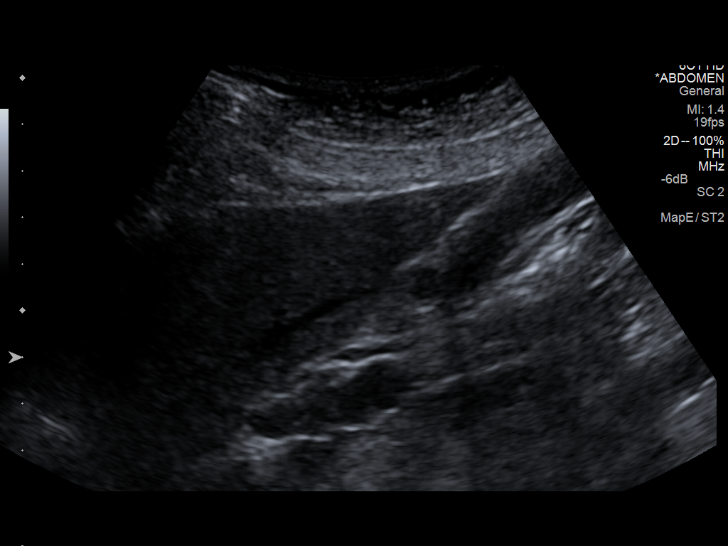
[im 51/76]
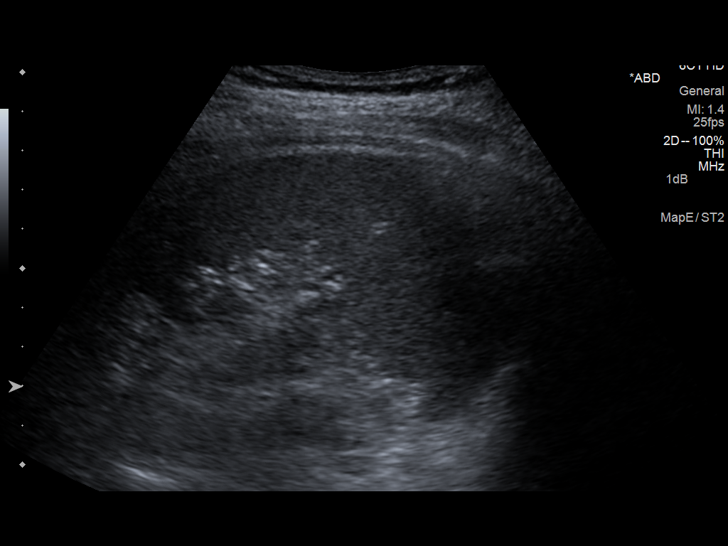
[im 57/76]
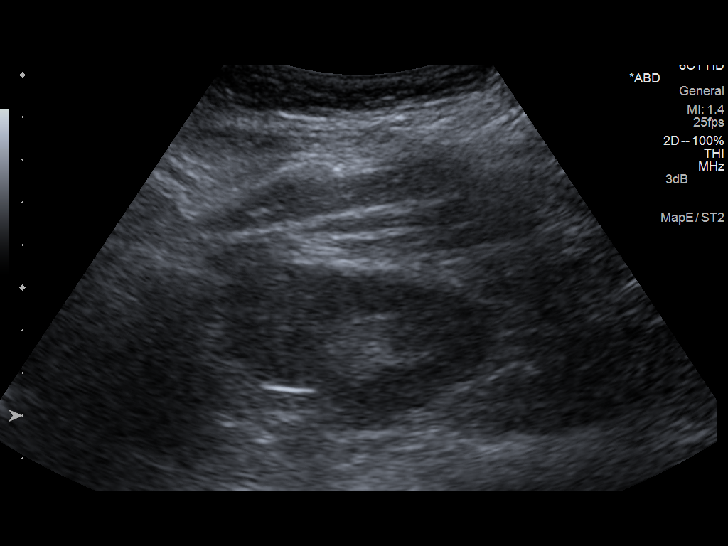
[im 63/76]
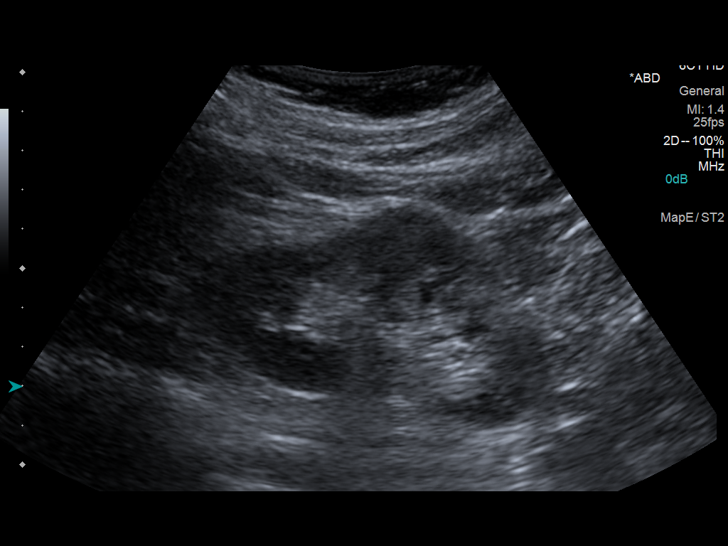
[im 69/76]
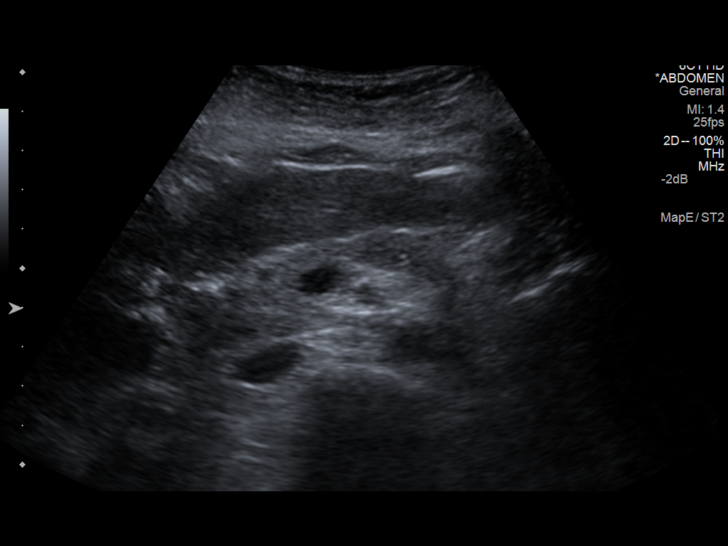
[im 76/76]
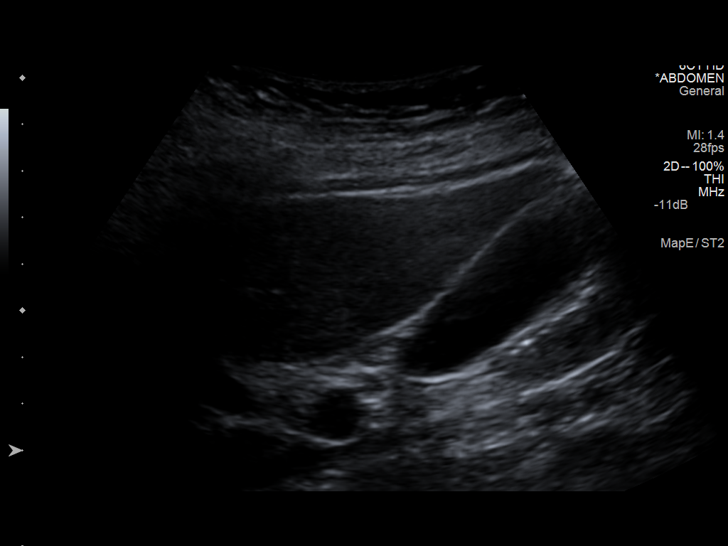

[14 of 25 positions shown; findings below may reference images not displayed]

FINDINGS: Gallbladder:

No gallstones or wall thickening visualized. No sonographic Murphy
sign noted.

Common bile duct:

Diameter: 3.6 mm.

Liver:

No focal lesion identified. Within normal limits in parenchymal
echogenicity.

IVC:

No abnormality visualized.

Pancreas:

Visualized portion unremarkable.

Spleen:

Size and appearance within normal limits.

Right Kidney:

Length: 9.3 cm. Echogenicity within normal limits. No mass or
hydronephrosis visualized.

Left Kidney:

Length: 9.9 cm. Echogenicity within normal limits. No mass or
hydronephrosis visualized.

Abdominal aorta:

No aneurysm visualized.

Other findings:

None.
IMPRESSION: 1. No acute findings.
2. Normal exam.

## 2012-12-19 ENCOUNTER — Encounter: Payer: Self-pay | Admitting: Internal Medicine

## 2013-01-09 ENCOUNTER — Telehealth: Payer: Self-pay | Admitting: Gastroenterology

## 2013-01-10 NOTE — Telephone Encounter (Signed)
Pt wanted advice on Hemorrhoid meds stating hydrocortisone makes her burn. She states she has pressure in her buttocks, no burning yet. Looked up proctofoam and there OTC meds she can buy; she will try one of them.

## 2013-01-23 ENCOUNTER — Encounter: Payer: Self-pay | Admitting: Internal Medicine

## 2013-01-23 ENCOUNTER — Ambulatory Visit (INDEPENDENT_AMBULATORY_CARE_PROVIDER_SITE_OTHER): Payer: BC Managed Care – PPO | Admitting: Internal Medicine

## 2013-01-23 VITALS — BP 110/74 | HR 88 | Ht 61.0 in | Wt 106.0 lb

## 2013-01-23 DIAGNOSIS — K3184 Gastroparesis: Secondary | ICD-10-CM

## 2013-01-23 DIAGNOSIS — R109 Unspecified abdominal pain: Secondary | ICD-10-CM

## 2013-01-23 DIAGNOSIS — K5900 Constipation, unspecified: Secondary | ICD-10-CM

## 2013-01-23 DIAGNOSIS — K649 Unspecified hemorrhoids: Secondary | ICD-10-CM

## 2013-01-23 MED ORDER — HYDROCORTISONE ACETATE 25 MG RE SUPP: RECTAL | Status: DC

## 2013-01-23 NOTE — Progress Notes (Signed)
HISTORY OF PRESENT ILLNESS:  Ruth Terry is a 58 y.o. female , long-standing patient of Dr. Verl Blalock, who presents today to establish as a new GI patient. She has been diagnosed with motility disorder as manifested by delayed gastric emptying (gastric emptying scan May 2012) and constipation. Also GERD. She has also had problems with hemorrhoids. Last colonoscopy and upper endoscopy were performed in 2009. These were without significant abnormalities. Recent complaints of abdominal pain. Normal ultrasound December 2014. Unremarkable blood work. Chief complaint today is sensation of difficulty evacuation of bowels and intermittent problems with hemorrhoids described as swelling. For her bowels she takes Linzess 145 mcg daily, Dulcolax, and Metamucil. With this regimen she moves her bowels twice daily. Always, once daily. In terms of hemorrhoids, over-the-counter creams are not helpful. Not complaining of abdominal pain currently. Still with bloating. She was also placed on domperidone. Last seen by Dr. Sharlett Iles December 2014. Note reviewed. For GERD she is on lansoprazole. No significant symptoms.  REVIEW OF SYSTEMS:  All non-GI ROS negative except for trouble with bladder emptying  Past Medical History  Diagnosis Date  . MVP (mitral valve prolapse)   . IBS (irritable bowel syndrome)   . Hemorrhoids   . Hyperlipidemia   . GERD (gastroesophageal reflux disease)   . Gastroparesis   . Polyp, stomach 04-20-10    egd  . Diverticulosis 04-20-10    colonoscopy  . UTI (lower urinary tract infection)   . Chronic constipation     History reviewed. No pertinent past surgical history.  Social History Ruth Terry  reports that she has quit smoking. Her smoking use included Cigarettes. She has a 3 pack-year smoking history. She uses smokeless tobacco. She reports that she drinks alcohol. She reports that she does not use illicit drugs.  family history includes Diabetes in her father;  Heart attack in her father; Hypertension in her mother.  Allergies  Allergen Reactions  . Chlordiazepoxide-Clidinium   . Ciprofloxacin   . Sulfa Drugs Cross Reactors Nausea Only  . Nitrofurantoin Monohyd Macro Rash       PHYSICAL EXAMINATION: Vital signs: BP 110/74  Pulse 88  Ht 5\' 1"  (1.549 m)  Wt 106 lb (48.081 kg)  BMI 20.04 kg/m2  Constitutional: generally well-appearing, no acute distress Psychiatric: alert and oriented x3, cooperative Eyes: extraocular movements intact, anicteric, conjunctiva pink Mouth: oral pharynx moist, no lesions Neck: supple no lymphadenopathy Cardiovascular: heart regular rate and rhythm, no murmur Lungs: clear to auscultation bilaterally Abdomen: soft, nontender, nondistended, no obvious ascites, no peritoneal signs, normal bowel sounds, no organomegaly Rectal: No external abnormalities. Noninflamed internal hemorrhoids Extremities: no lower extremity edema bilaterally Skin: no lesions on visible extremities Neuro: No focal deficits. No asterixis.    ASSESSMENT:  #1. Chronic constipation. Moving bowels twice daily with current regimen #2. History of gastroparesis. On domperidone #3. GERD. On lansoprazole #4. Intermittent internal hemorrhoids swelling #5. Minimal abnormalities on colonoscopy and upper endoscopy 2009  PLAN:  #1. Continue current bowel regimen. Reassurance regarding bowel frequency. Discussed pelvic floor dysfunction and possible tertiary referral if she was interested #2. Continue promotility agent and PPI for now #3. Prescribe Anusol-HC suppositories. Use as directed #4. Routine repeat screening colonoscopy around 2019 #5. Resume general medical care with PCP with GI followup as needed

## 2013-01-23 NOTE — Patient Instructions (Signed)
We have sent the following medications to your pharmacy for you to pick up at your convenience:  Anusol HC suppositories  

## 2013-01-26 ENCOUNTER — Telehealth: Payer: Self-pay | Admitting: *Deleted

## 2013-01-26 DIAGNOSIS — K3184 Gastroparesis: Secondary | ICD-10-CM

## 2013-01-26 NOTE — Telephone Encounter (Signed)
REQUEST REFILL ON DEXILANT

## 2013-01-29 ENCOUNTER — Other Ambulatory Visit: Payer: Self-pay

## 2013-01-29 DIAGNOSIS — K3184 Gastroparesis: Secondary | ICD-10-CM

## 2013-01-29 MED ORDER — DEXLANSOPRAZOLE 60 MG PO CPDR: 60.0000 mg | DELAYED_RELEASE_CAPSULE | Freq: Every day | ORAL | Status: DC

## 2013-01-29 NOTE — Telephone Encounter (Signed)
Was transferred to Dr. Henrene Pastor

## 2013-01-29 NOTE — Telephone Encounter (Signed)
Refilled Dexilant 

## 2013-05-29 ENCOUNTER — Telehealth: Payer: Self-pay | Admitting: Internal Medicine

## 2013-05-30 NOTE — Telephone Encounter (Signed)
Submitted prior authorization through Cover My Meds.  Awaiting response.

## 2013-06-01 NOTE — Telephone Encounter (Signed)
Received approval of Dexilant through cover my meds.  Called patient's drug store to have them re-run the rx - was approved.  Called patient and let her know they would be able to fill it now.  Patient acknowledged and understood.

## 2013-09-05 ENCOUNTER — Other Ambulatory Visit: Payer: Self-pay | Admitting: Internal Medicine

## 2013-10-24 ENCOUNTER — Telehealth: Payer: Self-pay | Admitting: Internal Medicine

## 2013-10-29 MED ORDER — AMBULATORY NON FORMULARY MEDICATION: Status: DC

## 2013-10-29 NOTE — Telephone Encounter (Signed)
Patient called [facility] and was told I could fax the prescription - they did not give her a reference number as they have in the past.  Faxed rx for Domperidone to [phone]

## 2013-11-01 NOTE — Telephone Encounter (Signed)
Domperidone rx has been faxed to [facility]

## 2013-11-11 ENCOUNTER — Emergency Department (HOSPITAL_COMMUNITY)
Admission: EM | Admit: 2013-11-11 | Discharge: 2013-11-11 | Disposition: A | Discharge status: Home / Self Care | Payer: BC Managed Care – PPO | Attending: Emergency Medicine | Admitting: Emergency Medicine

## 2013-11-11 ENCOUNTER — Encounter (HOSPITAL_COMMUNITY): Payer: Self-pay | Admitting: Emergency Medicine

## 2013-11-11 ENCOUNTER — Emergency Department (HOSPITAL_COMMUNITY): Payer: BC Managed Care – PPO

## 2013-11-11 DIAGNOSIS — Z87440 Personal history of urinary (tract) infections: Secondary | ICD-10-CM | POA: Insufficient documentation

## 2013-11-11 DIAGNOSIS — Z79899 Other long term (current) drug therapy: Secondary | ICD-10-CM | POA: Insufficient documentation

## 2013-11-11 DIAGNOSIS — Z8679 Personal history of other diseases of the circulatory system: Secondary | ICD-10-CM | POA: Diagnosis not present

## 2013-11-11 DIAGNOSIS — Z87891 Personal history of nicotine dependence: Secondary | ICD-10-CM | POA: Diagnosis not present

## 2013-11-11 DIAGNOSIS — K589 Irritable bowel syndrome without diarrhea: Secondary | ICD-10-CM | POA: Insufficient documentation

## 2013-11-11 DIAGNOSIS — R1013 Epigastric pain: Secondary | ICD-10-CM | POA: Diagnosis present

## 2013-11-11 DIAGNOSIS — Z8639 Personal history of other endocrine, nutritional and metabolic disease: Secondary | ICD-10-CM | POA: Diagnosis not present

## 2013-11-11 DIAGNOSIS — K5900 Constipation, unspecified: Secondary | ICD-10-CM | POA: Diagnosis not present

## 2013-11-11 DIAGNOSIS — Z7952 Long term (current) use of systemic steroids: Secondary | ICD-10-CM | POA: Insufficient documentation

## 2013-11-11 DIAGNOSIS — K219 Gastro-esophageal reflux disease without esophagitis: Secondary | ICD-10-CM | POA: Insufficient documentation

## 2013-11-11 LAB — CBC WITH DIFFERENTIAL/PLATELET
BASOS ABS: 0 10*3/uL (ref 0.0–0.1)
Basophils Relative: 0 % (ref 0–1)
EOS PCT: 2 % (ref 0–5)
Eosinophils Absolute: 0.1 10*3/uL (ref 0.0–0.7)
HCT: 44.1 % (ref 36.0–46.0)
Hemoglobin: 15.4 g/dL — ABNORMAL HIGH (ref 12.0–15.0)
Lymphocytes Relative: 25 % (ref 12–46)
Lymphs Abs: 1.2 10*3/uL (ref 0.7–4.0)
MCH: 31.9 pg (ref 26.0–34.0)
MCHC: 34.9 g/dL (ref 30.0–36.0)
MCV: 91.3 fL (ref 78.0–100.0)
Monocytes Absolute: 0.3 10*3/uL (ref 0.1–1.0)
Monocytes Relative: 6 % (ref 3–12)
Neutro Abs: 3.3 10*3/uL (ref 1.7–7.7)
Neutrophils Relative %: 67 % (ref 43–77)
PLATELETS: 238 10*3/uL (ref 150–400)
RBC: 4.83 MIL/uL (ref 3.87–5.11)
RDW: 13.3 % (ref 11.5–15.5)
WBC: 4.9 10*3/uL (ref 4.0–10.5)

## 2013-11-11 LAB — URINALYSIS, ROUTINE W REFLEX MICROSCOPIC
Bilirubin Urine: NEGATIVE
GLUCOSE, UA: NEGATIVE mg/dL
HGB URINE DIPSTICK: NEGATIVE
Ketones, ur: NEGATIVE mg/dL
Leukocytes, UA: NEGATIVE
Nitrite: NEGATIVE
Protein, ur: NEGATIVE mg/dL
Specific Gravity, Urine: 1.01 (ref 1.005–1.030)
Urobilinogen, UA: 0.2 mg/dL (ref 0.0–1.0)
pH: 6 (ref 5.0–8.0)

## 2013-11-11 LAB — COMPREHENSIVE METABOLIC PANEL
ALK PHOS: 83 U/L (ref 39–117)
ALT: 12 U/L (ref 0–35)
AST: 16 U/L (ref 0–37)
Albumin: 4.1 g/dL (ref 3.5–5.2)
Anion gap: 13 (ref 5–15)
BUN: 8 mg/dL (ref 6–23)
CALCIUM: 10.3 mg/dL (ref 8.4–10.5)
CO2: 28 meq/L (ref 19–32)
Chloride: 101 mEq/L (ref 96–112)
Creatinine, Ser: 0.73 mg/dL (ref 0.50–1.10)
GLUCOSE: 100 mg/dL — AB (ref 70–99)
POTASSIUM: 3.8 meq/L (ref 3.7–5.3)
SODIUM: 142 meq/L (ref 137–147)
TOTAL PROTEIN: 7.7 g/dL (ref 6.0–8.3)
Total Bilirubin: 0.2 mg/dL — ABNORMAL LOW (ref 0.3–1.2)

## 2013-11-11 LAB — LIPASE, BLOOD: Lipase: 41 U/L (ref 11–59)

## 2013-11-11 IMAGING — CT CT ABD-PELV W/ CM
2 of 5 series · 16 of 46 positions shown, 18 images · IV contrast (omnipaque)
Comparison: None.

CLINICAL DATA: Pt. Is complaining of abdominal pain onset [REDACTED]
that she describes as "tender and sore". Pain worsened today. She
also complains of constipation, with most recent BM on [REDACTED]. Pt
adds that she normally has a couple BM's a day. Hiccups and movement
of torso exacerbates pain.

EXAM:
CT ABDOMEN AND PELVIS WITH CONTRAST
TECHNIQUE: Multidetector CT imaging of the abdomen and pelvis was performed
using the standard protocol following bolus administration of
intravenous contrast.
CONTRAST:  50mL OMNIPAQUE IOHEXOL 300 MG/ML SOLN, 100mL OMNIPAQUE
IOHEXOL 300 MG/ML SOLN

[Series 2: abd_pel_with 5.0 b40f · axial · 0.66mm/px · z∈[-403,-53]mm · 13 of 80 slices shown, 15 images]
[im 5/80  soft-tissue]
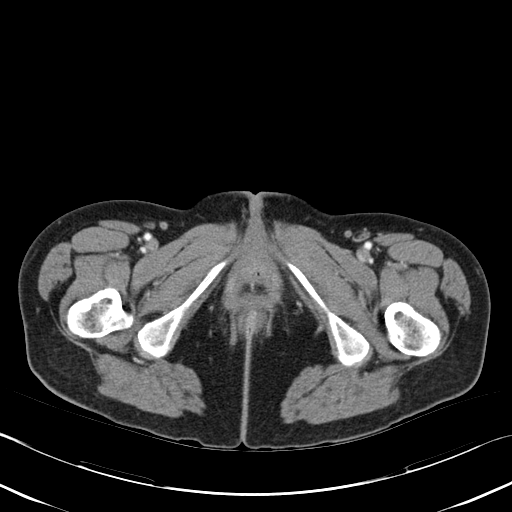
[im 5/80  bone]
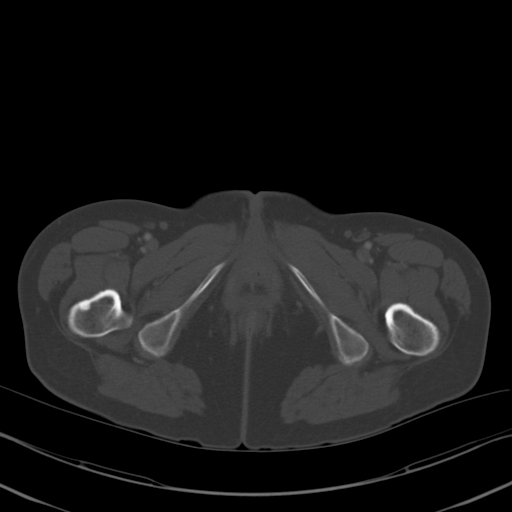
[im 9/80  soft-tissue]
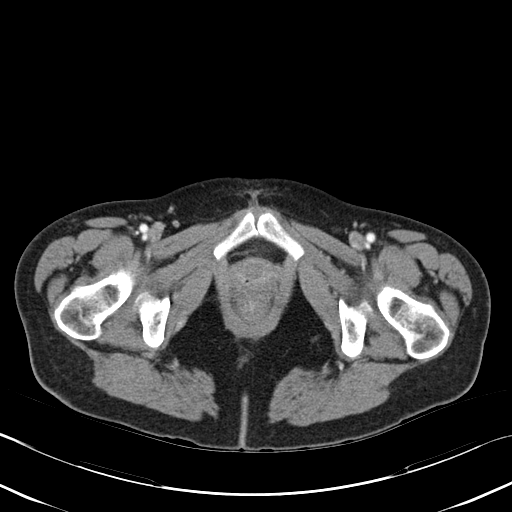
[im 18/80  soft-tissue]
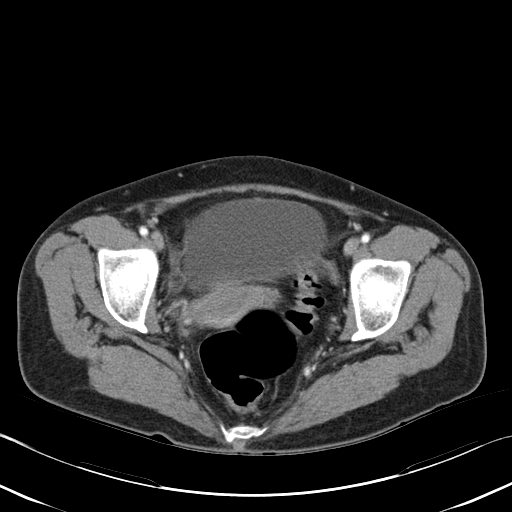
[im 22/80  soft-tissue]
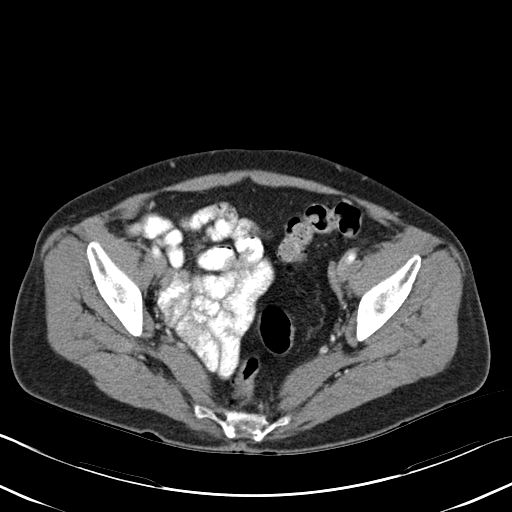
[im 27/80  soft-tissue]
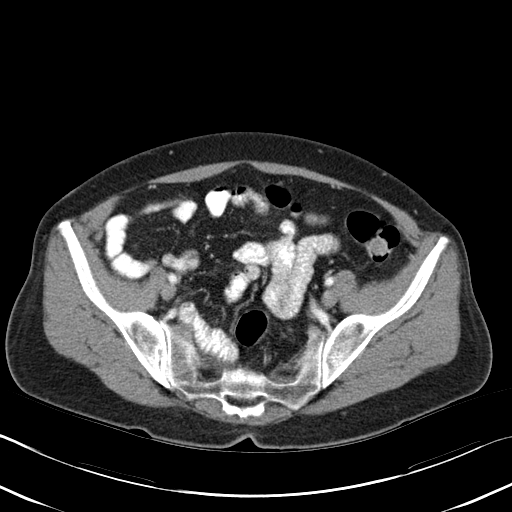
[im 36/80  soft-tissue]
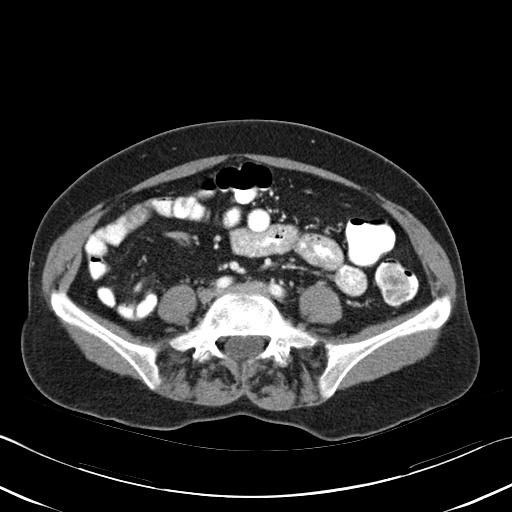
[im 40/80  soft-tissue]
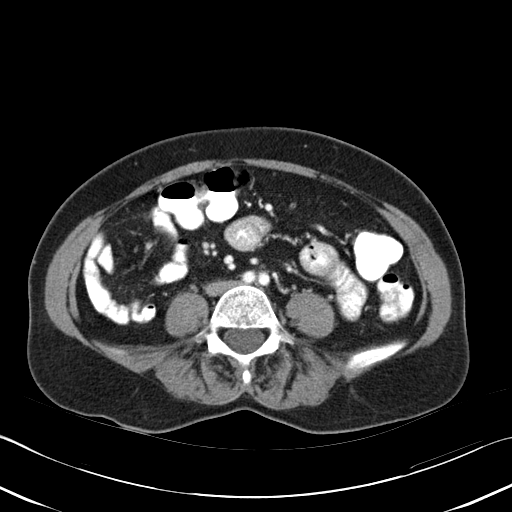
[im 44/80  soft-tissue]
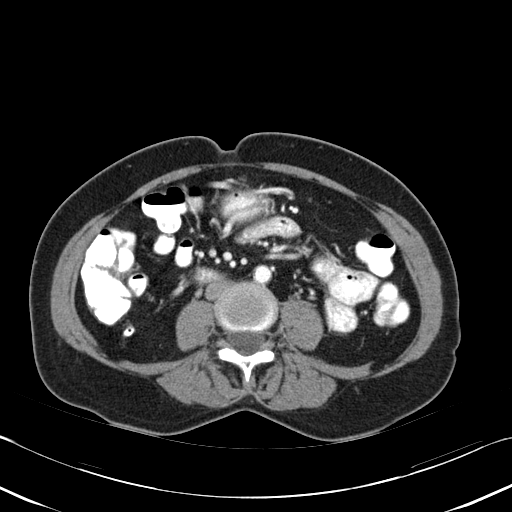
[im 53/80  soft-tissue]
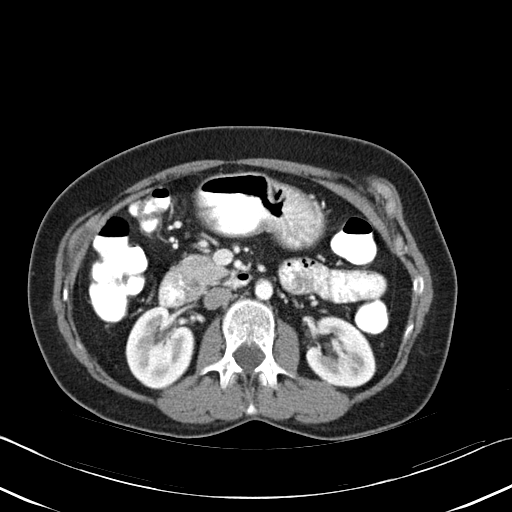
[im 53/80  bone]
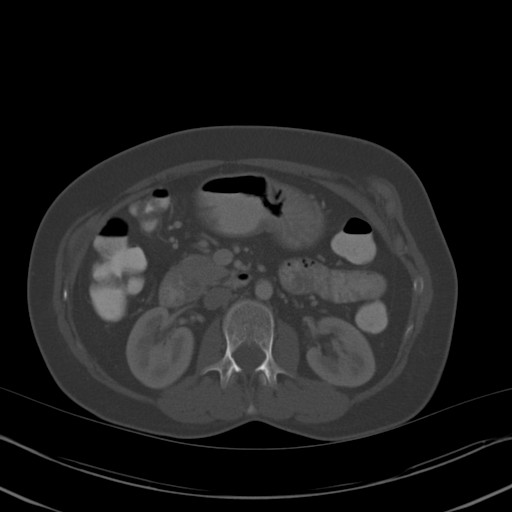
[im 58/80  soft-tissue]
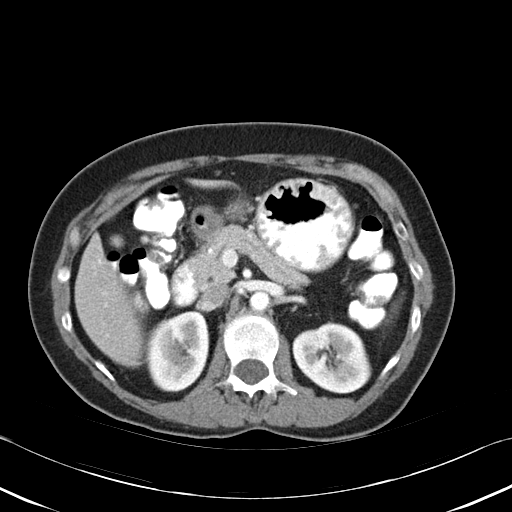
[im 62/80  soft-tissue]
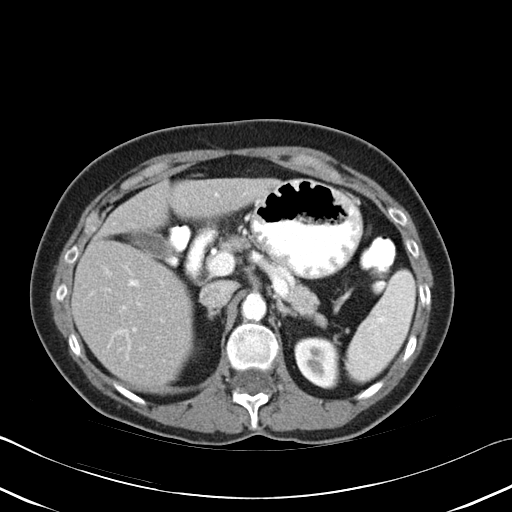
[im 71/80  soft-tissue]
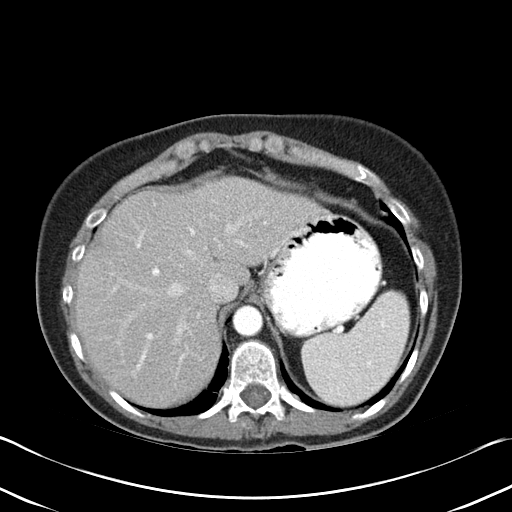
[im 75/80  soft-tissue]
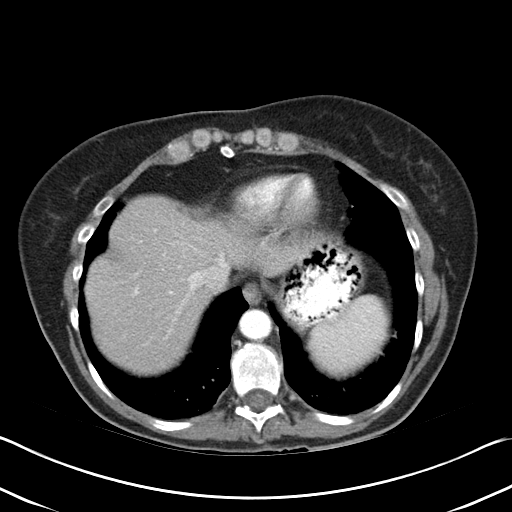

[Series 3: abd_pel_with 3.0 spo cor · coronal · 0.59mm/px · 3 of 71 slices shown]
[im 24/71  soft-tissue]
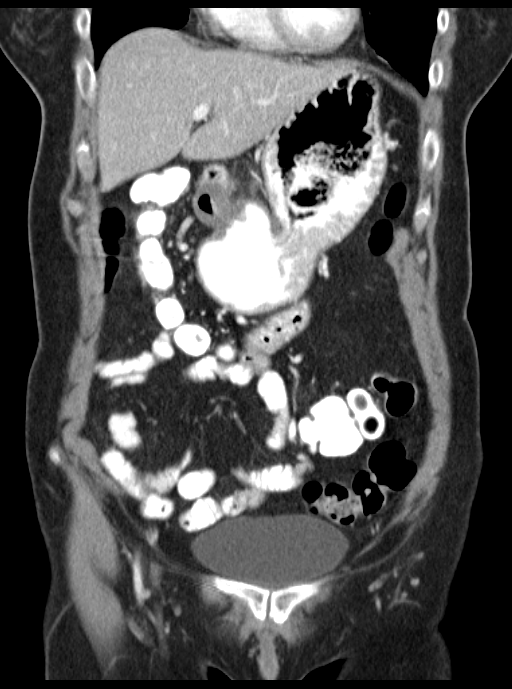
[im 32/71  soft-tissue]
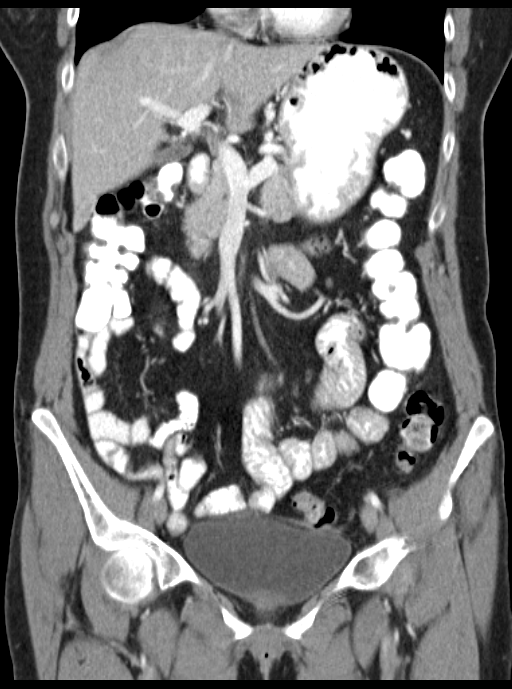
[im 39/71  soft-tissue]
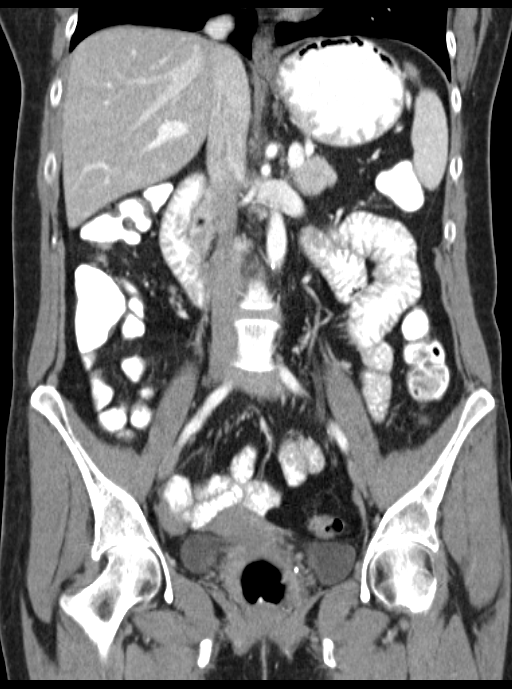

[16 of 46 positions shown; findings below may reference images not displayed]

FINDINGS: Minimal subsegmental atelectasis at the dependent lung bases. Heart
is normal in size.

Normal liver, spleen, gallbladder and pancreas. No bile duct
dilation. No adrenal masses

Normal kidneys, ureters and bladder.

Uterus and adnexa are unremarkable.

No pathologically enlarged lymph nodes. No abnormal fluid
collections.

Colon and small bowel are unremarkable. Normal appendix is
visualized.

There is an 8 mm focal opacity in the inferior left breast.

No significant bony abnormality.
IMPRESSION: 1. No acute findings.
2. Normal appearance of the bowel. No significant increased stool in
the colon.
3. 8 mm possible small left breast mass. Recommend correlation with
non urgent mammography if this has not been recently performed.

## 2013-11-11 MED ORDER — IOHEXOL 300 MG/ML  SOLN
100.0000 mL | Freq: Once | INTRAMUSCULAR | Status: AC | PRN
  Administered 2013-11-11: 100 mL via INTRAVENOUS

## 2013-11-11 MED ORDER — SIMETHICONE 40 MG/0.6ML PO SUSP
40.0000 mg | Freq: Once | ORAL | Status: AC
  Administered 2013-11-11: 40 mg via ORAL
  Filled 2013-11-11: qty 30
  Filled 2013-11-11: qty 60

## 2013-11-11 MED ORDER — SIMETHICONE 40 MG/0.6ML PO SUSP: 40.0000 mg | Freq: Once | ORAL | Status: DC

## 2013-11-11 MED ORDER — SODIUM CHLORIDE 0.9 % IV BOLUS (SEPSIS)
1000.0000 mL | Freq: Once | INTRAVENOUS | Status: AC
  Administered 2013-11-11: 1000 mL via INTRAVENOUS

## 2013-11-11 MED ORDER — IOHEXOL 300 MG/ML  SOLN
50.0000 mL | Freq: Once | INTRAMUSCULAR | Status: AC | PRN
  Administered 2013-11-11: 50 mL via ORAL

## 2013-11-11 MED ORDER — ONDANSETRON HCL 4 MG/2ML IJ SOLN
4.0000 mg | Freq: Once | INTRAMUSCULAR | Status: DC
  Filled 2013-11-11: qty 2

## 2013-11-11 MED ORDER — FENTANYL CITRATE 0.05 MG/ML IJ SOLN: 50.0000 ug | Freq: Once | INTRAMUSCULAR | Status: DC

## 2013-11-11 NOTE — ED Notes (Signed)
MD at bedside. 

## 2013-11-11 NOTE — ED Notes (Signed)
Dr.Knapp at bedside  

## 2013-11-11 NOTE — ED Notes (Addendum)
Pt reports seen for same on Friday at urgent care. Pt reports no relief with px. Pt reports diagnosis of gastritis. Pt denies v/d. Pt reports small rash to LLQ of abdomen since last night.

## 2013-11-11 NOTE — ED Notes (Signed)
CT made aware pt finished contrast.

## 2013-11-11 NOTE — Discharge Instructions (Signed)
Continue the Carafate. You can get simethicone (mylicon tablets or liquid)  OTC for your gas symptoms. You need to get a mammogram done soon to look at an abnormal area in your left lower breast that was seen on your CT scan. Call your doctors office to get that scheduled.  Return to the ED if you get a fever, have uncontrolled vomiting or pain.

## 2013-11-11 NOTE — ED Notes (Signed)
Pt states she was recently seen at Urgent Care and was told to come to the ED if abdominal pain got any worse. Pt states "I have a lot of stomach problems anyway" stating reflux and gastritis. Pt pointing to upper abdomen and states "someone told me it could be my pancreas". NAD

## 2013-11-11 NOTE — ED Provider Notes (Signed)
CSN: [id number]     Arrival date & time 11/11/13  1130 History  This chart was scribed for Janice Norrie, MD by Martinique Peace, ED Scribe. The patient was seen in [place]. The patient's care was started at 2:00 PM.     Chief Complaint  Patient presents with  . Abdominal Pain      Patient is a 58 y.o. female presenting with abdominal pain. The history is provided by the patient. No language interpreter was used.  Abdominal Pain Associated symptoms: constipation   Associated symptoms: no fever, no nausea and no vomiting     HPI Comments: Ruth Terry is a 58 y.o. female who presents to the Emergency Department complaining of abdominal pain onset Thursday, 3 days ago, that she describes as "tender and sore", but it is not tender to touch. Pain worsened today.  She also complains of constipation, with most recent BM on Friday, 2 days ago. Pt adds that she normally has a couple BM's a day. She also has a history of IBS and can have diarrhea one day and constipation the next. Has been on linzess for about 1 1/2 years which she feels has helped. Her IBS. Hiccups and movement of torso exacerbates pain today. Nothing makes the pain feel better. She denies any similar pains in the past.she states her pains have generally been in her lower abdomen before. No complaints of nausea, vomiting, or fever. She states she is belching a lot but does not get reflux fluid in her throat. She does not notice any relation to eating food as to the discomfort. She was seen at the urgent care 2 days ago. She was told that she may have gastritis or she could have patriot hiatus. She was started on Carafate. She does not feel like the Carafate is helping. She also feels like she's having a lot of gas and usually Gas-X helps but it has not helped today. She states the pain today is something she's never had before.  Drinks wine a couple times a week, last intake of wine was Tuesday night, 5 days ago. 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 No stress at work. History of acid reflux and gastroporesis.  Pt is non-smoker, uses E-cigarettes.  PCP Dr Melina Copa GI Dr Henrene Pastor  Past Medical History  Diagnosis Date  . MVP (mitral valve prolapse)   . IBS (irritable bowel syndrome)   . Hemorrhoids   . Hyperlipidemia   . GERD (gastroesophageal reflux disease)   . Gastroparesis   . Polyp, stomach 04-20-10    egd  . Diverticulosis 04-20-10    colonoscopy  . UTI (lower urinary tract infection)   . Chronic constipation    History reviewed. No pertinent past surgical history. Family History  Problem Relation Age of Onset  . Hypertension Mother   . Diabetes Father   . Heart attack Father     Age 55   History  Substance Use Topics  . Smoking status: Former Smoker -- 0.50 packs/day for 6 years    Types: Cigarettes  . Smokeless tobacco: Current User     Comment: USE SMOKELESS CIGARETTES  . Alcohol Use: 0.0 oz/week     Comment: 1 a week   Lives at home Lives with spouse Employed Drinks a few glasses of wine weekly, last was 5 days ago Uses e cigarettes  OB History    No data available     Review of Systems  Constitutional: Negative for fever.  Gastrointestinal:  Positive for abdominal pain and constipation. Negative for nausea and vomiting.  All other systems reviewed and are negative.     Allergies  Chlordiazepoxide-clidinium; Ciprofloxacin; Sulfa drugs cross reactors; and Nitrofurantoin monohyd macro  Home Medications   Prior to Admission medications   Medication Sig Start Date End Date Taking? Authorizing Provider  alfuzosin (UROXATRAL) 10 MG 24 hr tablet Take 10 mg by mouth daily.   Yes Historical Provider, MD  AMBULATORY NON FORMULARY MEDICATION Domperidone 10mg  take one at bedtime 10/29/13  Yes Irene Shipper, MD  dexlansoprazole (DEXILANT) 60 MG capsule Take 60 mg by mouth daily.   Yes Historical Provider, MD  Linaclotide Rolan Lipa) 145 MCG CAPS Take 1 capsule (145 mcg total) by mouth daily. 04/12/12   Yes Sable Feil, MD  Probiotic Product (ALIGN) 4 MG CAPS Take 1 capsule by mouth daily.   Yes Historical Provider, MD  psyllium (METAMUCIL) 58.6 % packet Take 1 packet by mouth daily.   Yes Historical Provider, MD  sucralfate (CARAFATE) 1 G tablet Take 1 g by mouth 4 (four) times daily.   Yes Historical Provider, MD  bisacodyl (DULCOLAX) 5 MG EC tablet Take 5 mg by mouth daily as needed for moderate constipation.    Historical Provider, MD  DEXILANT 60 MG capsule TAKE ONE (1) CAPSULE EACH DAY Patient not taking: Reported on 11/11/2013 09/06/13   Irene Shipper, MD  hydrocortisone (ANUSOL-HC) 25 MG suppository Use one suppository at bedtime as needed for hemorrhoids Patient not taking: Reported on 11/11/2013 01/23/13   Irene Shipper, MD   BP 149/78 mmHg  Pulse 85  Temp(Src) 98.1 F (36.7 C) (Oral)  Resp 18  Ht 5' (1.524 m)  Wt 107 lb (48.535 kg)  BMI 20.90 kg/m2  SpO2 100%  Vital signs normal   Physical Exam  Constitutional: She is oriented to person, place, and time. She appears well-developed and well-nourished.  Non-toxic appearance. She does not appear ill. No distress.  HENT:  Head: Normocephalic and atraumatic.  Right Ear: External ear normal.  Left Ear: External ear normal.  Nose: Nose normal. No mucosal edema or rhinorrhea.  Mouth/Throat: Oropharynx is clear and moist and mucous membranes are normal. No dental abscesses or uvula swelling.  Eyes: Conjunctivae and EOM are normal. Pupils are equal, round, and reactive to light.  Neck: Normal range of motion and full passive range of motion without pain. Neck supple.  Cardiovascular: Normal rate, regular rhythm and normal heart sounds.  Exam reveals no gallop and no friction rub.   No murmur heard. Pulmonary/Chest: Effort normal and breath sounds normal. No respiratory distress. She has no wheezes. She has no rhonchi. She has no rales. She exhibits no tenderness and no crepitus.  Abdominal: Soft. Normal appearance and bowel sounds  are normal. She exhibits no distension. There is tenderness. There is no rebound and no guarding.  Bilateral CVA tenderness.  Mild diffuse abdominal tenderness but mainly in the epigastric area.   Musculoskeletal: Normal range of motion. She exhibits no edema or tenderness.  Moves all extremities well.   Neurological: She is alert and oriented to person, place, and time. She has normal strength. No cranial nerve deficit.  Skin: Skin is warm, dry and intact. No rash noted. No erythema. No pallor.  Psychiatric: She has a normal mood and affect. Her speech is normal and behavior is normal. Her mood appears not anxious.  Nursing note and vitals reviewed.   ED Course  Procedures (including critical care time)  Medications  ondansetron (ZOFRAN) injection 4 mg (not administered)  fentaNYL (SUBLIMAZE) injection 50 mcg (0 mcg Intravenous Hold 11/11/13 1417)  sodium chloride 0.9 % bolus 1,000 mL (0 mLs Intravenous Stopped 11/11/13 1739)  iohexol (OMNIPAQUE) 300 MG/ML solution 50 mL (50 mLs Oral Contrast Given 11/11/13 1445)  iohexol (OMNIPAQUE) 300 MG/ML solution 100 mL (100 mLs Intravenous Contrast Given 11/11/13 1557)  simethicone (MYLICON) 40 SH/7.56YO suspension 40 mg (40 mg Oral Given 11/11/13 1710)    17:00 Pt's pain was improving after her medications before her CT. Still c/o a feeling of a lot of gas. Given simethicone orally. She was given the results of her CT scan including the abnormal area seen in her left breast. States her GI is Dr Henrene Pastor. We discussed that IBS is a disease of exclusion.  Pt rechecked, her symptoms are better. Discussed again need to get mammogram. Denies family hx of breast cancer.    Labs Review Results for orders placed or performed during the hospital encounter of 11/11/13  CBC with Differential  Result Value Ref Range   WBC 4.9 4.0 - 10.5 K/uL   RBC 4.83 3.87 - 5.11 MIL/uL   Hemoglobin 15.4 (H) 12.0 - 15.0 g/dL   HCT 44.1 36.0 - 46.0 %   MCV 91.3 78.0 - 100.0 fL    MCH 31.9 26.0 - 34.0 pg   MCHC 34.9 30.0 - 36.0 g/dL   RDW 13.3 11.5 - 15.5 %   Platelets 238 150 - 400 K/uL   Neutrophils Relative % 67 43 - 77 %   Neutro Abs 3.3 1.7 - 7.7 K/uL   Lymphocytes Relative 25 12 - 46 %   Lymphs Abs 1.2 0.7 - 4.0 K/uL   Monocytes Relative 6 3 - 12 %   Monocytes Absolute 0.3 0.1 - 1.0 K/uL   Eosinophils Relative 2 0 - 5 %   Eosinophils Absolute 0.1 0.0 - 0.7 K/uL   Basophils Relative 0 0 - 1 %   Basophils Absolute 0.0 0.0 - 0.1 K/uL  Comprehensive metabolic panel  Result Value Ref Range   Sodium 142 137 - 147 mEq/L   Potassium 3.8 3.7 - 5.3 mEq/L   Chloride 101 96 - 112 mEq/L   CO2 28 19 - 32 mEq/L   Glucose, Bld 100 (H) 70 - 99 mg/dL   BUN 8 6 - 23 mg/dL   Creatinine, Ser 0.73 0.50 - 1.10 mg/dL   Calcium 10.3 8.4 - 10.5 mg/dL   Total Protein 7.7 6.0 - 8.3 g/dL   Albumin 4.1 3.5 - 5.2 g/dL   AST 16 0 - 37 U/L   ALT 12 0 - 35 U/L   Alkaline Phosphatase 83 39 - 117 U/L   Total Bilirubin 0.2 (L) 0.3 - 1.2 mg/dL   GFR calc non Af Amer >90 >90 mL/min   GFR calc Af Amer >90 >90 mL/min   Anion gap 13 5 - 15  Lipase, blood  Result Value Ref Range   Lipase 41 11 - 59 U/L  Urinalysis, Routine w reflex microscopic  Result Value Ref Range   Color, Urine YELLOW YELLOW   APPearance CLEAR CLEAR   Specific Gravity, Urine 1.010 1.005 - 1.030   pH 6.0 5.0 - 8.0   Glucose, UA NEGATIVE NEGATIVE mg/dL   Hgb urine dipstick NEGATIVE NEGATIVE   Bilirubin Urine NEGATIVE NEGATIVE   Ketones, ur NEGATIVE NEGATIVE mg/dL   Protein, ur NEGATIVE NEGATIVE mg/dL   Urobilinogen, UA 0.2 0.0 - 1.0 mg/dL  Nitrite NEGATIVE NEGATIVE   Leukocytes, UA NEGATIVE NEGATIVE    Laboratory interpretation all normal except mildly concentrated Hb    Imaging Review Ct Abdomen Pelvis W Contrast  11/11/2013   CLINICAL DATA:  Pt. Is complaining of abdominal pain onset Thursday that she describes as "tender and sore". Pain worsened today. She also complains of constipation, with  most recent BM on Friday. Pt adds that she normally has a couple BM's a day. Hiccups and movement of torso exacerbates pain.  EXAM: CT ABDOMEN AND PELVIS WITH CONTRAST  TECHNIQUE: Multidetector CT imaging of the abdomen and pelvis was performed using the standard protocol following bolus administration of intravenous contrast.  CONTRAST:  80mL OMNIPAQUE IOHEXOL 300 MG/ML SOLN, 139mL OMNIPAQUE IOHEXOL 300 MG/ML SOLN  COMPARISON:  None.  FINDINGS: Minimal subsegmental atelectasis at the dependent lung bases. Heart is normal in size.  Normal liver, spleen, gallbladder and pancreas. No bile duct dilation. No adrenal masses  Normal kidneys, ureters and bladder.  Uterus and adnexa are unremarkable.  No pathologically enlarged lymph nodes. No abnormal fluid collections.  Colon and small bowel are unremarkable. Normal appendix is visualized.  There is an 8 mm focal opacity in the inferior left breast.  No significant bony abnormality.  IMPRESSION: 1. No acute findings. 2. Normal appearance of the bowel. No significant increased stool in the colon. 3. 8 mm possible small left breast mass. Recommend correlation with non urgent mammography if this has not been recently performed.   Electronically Signed   By: Lajean Manes M.D.   On: 11/11/2013 16:40     EKG Interpretation None         MDM   Final diagnoses:  Epigastric abdominal pain    I personally performed the services described in this documentation, which was scribed in my presence. The recorded information has been reviewed and considered.  Rolland Porter, MD, [facility] Sander   Janice Norrie, MD 11/11/13 [phone]

## 2013-11-13 ENCOUNTER — Other Ambulatory Visit: Payer: Self-pay | Admitting: Physician Assistant

## 2013-11-13 DIAGNOSIS — N6320 Unspecified lump in the left breast, unspecified quadrant: Secondary | ICD-10-CM

## 2013-11-19 ENCOUNTER — Ambulatory Visit (INDEPENDENT_AMBULATORY_CARE_PROVIDER_SITE_OTHER): Payer: BC Managed Care – PPO | Admitting: Physician Assistant

## 2013-11-19 ENCOUNTER — Encounter: Payer: Self-pay | Admitting: Physician Assistant

## 2013-11-19 VITALS — BP 120/60 | HR 78 | Ht 68.0 in | Wt 104.0 lb

## 2013-11-19 DIAGNOSIS — K648 Other hemorrhoids: Secondary | ICD-10-CM

## 2013-11-19 DIAGNOSIS — K589 Irritable bowel syndrome without diarrhea: Secondary | ICD-10-CM

## 2013-11-19 DIAGNOSIS — K219 Gastro-esophageal reflux disease without esophagitis: Secondary | ICD-10-CM

## 2013-11-19 DIAGNOSIS — K3184 Gastroparesis: Secondary | ICD-10-CM

## 2013-11-19 MED ORDER — HYOSCYAMINE SULFATE 0.125 MG SL SUBL
SUBLINGUAL_TABLET | SUBLINGUAL | Status: DC
Start: 2013-11-19 — End: 2014-02-07

## 2013-11-19 MED ORDER — METRONIDAZOLE 250 MG PO TABS: 250.0000 mg | ORAL_TABLET | Freq: Three times a day (TID) | ORAL | Status: AC

## 2013-11-19 MED ORDER — MESALAMINE 1000 MG RE SUPP: 1000.0000 mg | Freq: Every day | RECTAL | Status: DC

## 2013-11-19 NOTE — Patient Instructions (Signed)
We sent prescriptions to [facility], [place], [place]. 1. Levsin SL  2. Flagyl 250 mg 3. Canasa Suppositories  Continue the Linzess 145 mcg.  Take Miralax, 1 capful  Daily as needed. Stop the Metamucil. Take Benefiber 1 tablet daily in juice or water.

## 2013-11-19 NOTE — Progress Notes (Signed)
Patient ID: Ruth Terry, female   DOB: Dec 08, 1955, 58 y.o.   MRN: [id number]     History of Present Illness:  This is a follow-up for this pleasant 58 year old female with a history of delayed gastric emptying, constipation predominant IBS, GERD, and hemorrhoids. Her last colonoscopy and upper endoscopy were performed in 2009. These were without significant abnormalities. Approximately 10 days ago she started having mid abdominal pain she was seen and an urgent care clinic, and diagnosed with gastritis. She was prescribed Carafate. 2 days later she still had abdominal pain and so she went to the emergency room. She had a CT of the abdomen and pelvis that was unrevealing. She has been having. Umbilical bloating with lots of gas and constipation. She has been straining at her hemorrhoids have flared. She has previously used Facilities manager for her hemorrhoids but ran out a few days ago and is requesting a refill. She reports that she usually has one to 2 bowel movements daily but for the past several weeks she has only been having 1 bowel movement a day she states that every night before bed she has to have a bowel movement but nothing will come out. She has been on Linzess 145 g for years. She had tried on one occasion with Dr. Jarold Motto to increase her Linzess to 290 g daily but this caused too much diarrhea and she was unable to tolerate higher dose. Her heartburn is well controlled on Dexilant and she has no dysphasia her main complaint is excessive bloating, gas, flatulence, and constipation. She gets some postprandial lower abdominal crampy pain that is relieved with passage of gas or defecation. She has had no bright red blood per rectum and no melena.  Past Medical History  Diagnosis Date  . MVP (mitral valve prolapse)   . IBS (irritable bowel syndrome)   . Hemorrhoids   . Hyperlipidemia   . GERD (gastroesophageal reflux disease)   . Gastroparesis   . Polyp, stomach 04-20-10    egd  .  Diverticulosis 04-20-10    colonoscopy  . UTI (lower urinary tract infection)   . Chronic constipation     History reviewed. No pertinent past surgical history. Family History  Problem Relation Age of Onset  . Hypertension Mother   . Diabetes Father   . Heart attack Father     Age 80   History  Substance Use Topics  . Smoking status: Former Smoker -- 0.50 packs/day for 6 years    Types: Cigarettes  . Smokeless tobacco: Current User     Comment: USE SMOKELESS CIGARETTES  . Alcohol Use: 0.0 oz/week     Comment: 1 a week   Current Outpatient Prescriptions  Medication Sig Dispense Refill  . alfuzosin (UROXATRAL) 10 MG 24 hr tablet Take 5 mg by mouth daily.     . AMBULATORY NON FORMULARY MEDICATION Domperidone 10mg  take one at bedtime 100 tablet 3  . bisacodyl (DULCOLAX) 5 MG EC tablet Take 5 mg by mouth daily as needed for moderate constipation.    Marland Kitchen DEXILANT 60 MG capsule TAKE ONE (1) CAPSULE EACH DAY 90 capsule 1  . dexlansoprazole (DEXILANT) 60 MG capsule Take 60 mg by mouth daily.    . hydrocortisone (ANUSOL-HC) 25 MG suppository Use one suppository at bedtime as needed for hemorrhoids (Patient taking differently: Place 25 mg rectally 2 (two) times daily as needed. Use one suppository at bedtime as needed for hemorrhoids) 30 suppository 3  . Linaclotide (LINZESS) 145 MCG CAPS Take  1 capsule (145 mcg total) by mouth daily. 90 capsule 1  . Meth-Hyo-M Bl-Na Phos-Ph Sal (URIBEL) 118 MG CAPS Take 1 capsule by mouth as needed.    . Probiotic Product (ALIGN) 4 MG CAPS Take 1 capsule by mouth daily.    . psyllium (METAMUCIL) 58.6 % packet Take 1 packet by mouth daily.    . sucralfate (CARAFATE) 1 G tablet Take 1 g by mouth 4 (four) times daily as needed.     . hyoscyamine (LEVSIN/SL) 0.125 MG SL tablet Place 1 tablet (0.125 mg total) under the tongue every 4 (four) hours as needed for cramping. 30 tablet 0  . hyoscyamine (LEVSIN/SL) 0.125 MG SL tablet Take 1 tab under the tongue twice  daily as needed. 60 tablet 0  . mesalamine (CANASA) 1000 MG suppository Place 1 suppository (1,000 mg total) rectally at bedtime. 30 suppository 6  . metroNIDAZOLE (FLAGYL) 250 MG tablet Take 1 tablet (250 mg total) by mouth 3 (three) times daily. 30 tablet 0   No current facility-administered medications for this visit.   Allergies  Allergen Reactions  . Chlordiazepoxide-Clidinium   . Ciprofloxacin   . Sulfa Drugs Cross Reactors Nausea Only  . Nitrofurantoin Monohyd Macro Rash      Review of Systems: Gen: Denies any fever, chills, sweats, anorexia, fatigue, weakness, malaise, weight loss, and sleep disorder CV: Denies chest pain, angina, palpitations, syncope, orthopnea, PND, peripheral edema, and claudication. Resp: Denies dyspnea at rest, dyspnea with exercise, cough, sputum, wheezing, coughing up blood, and pleurisy. GI: Denies vomiting blood, jaundice, and fecal incontinence.   Denies dysphagia or odynophagia. GU : Denies urinary burning, blood in urine, urinary frequency, urinary hesitancy, nocturnal urination, and urinary incontinence. MS: Denies joint pain, limitation of movement, and swelling, stiffness, low back pain, extremity pain. Denies muscle weakness, cramps, atrophy.  Derm: Denies rash, itching, dry skin, hives, moles, warts, or unhealing ulcers.  Psych: Denies depression, anxiety, memory loss, suicidal ideation, hallucinations, paranoia, and confusion. Heme: Denies bruising, bleeding, and enlarged lymph nodes. Neuro:  Denies any headaches, dizziness, paresthesia Endo:  Denies any problems with DM, thyroid, adrenal    Studies:   Ct Abdomen Pelvis W Contrast  11/11/2013   CLINICAL DATA:  Pt. Is complaining of abdominal pain onset Thursday that she describes as "tender and sore". Pain worsened today. She also complains of constipation, with most recent BM on Friday. Pt adds that she normally has a couple BM's a day. Hiccups and movement of torso exacerbates pain.   EXAM: CT ABDOMEN AND PELVIS WITH CONTRAST  TECHNIQUE: Multidetector CT imaging of the abdomen and pelvis was performed using the standard protocol following bolus administration of intravenous contrast.  CONTRAST:  50mL OMNIPAQUE IOHEXOL 300 MG/ML SOLN, OMNIPAQUE IOHEXOL 300 MG/ML SOLN  COMPARISON:  None.  FINDINGS: Minimal subsegmental atelectasis at the dependent lung bases. Heart is normal in size.  Normal liver, spleen, gallbladder and pancreas. No bile duct dilation. No adrenal masses  Normal kidneys, ureters and bladder.  Uterus and adnexa are unremarkable.  No pathologically enlarged lymph nodes. No abnormal fluid collections.  Colon and small bowel are unremarkable. Normal appendix is visualized.  There is an 8 mm focal opacity in the inferior left breast.  No significant bony abnormality.  IMPRESSION: 1. No acute findings. 2. Normal appearance of the bowel. No significant increased stool in the colon. 3. 8 mm possible small left breast mass. Recommend correlation with non urgent mammography if this has not been recently performed.  Electronically Signed   By: Amie Portland M.D.   On: 11/11/2013 16:40     Physical Exam: General: Pleasant, well developed ,female in no acute distress Head: Normocephalic and atraumatic Eyes:  sclerae anicteric, conjunctiva pink  Ears: Normal auditory acuity Lungs: Clear throughout to auscultation Heart: Regular rate and rhythm Abdomen: Soft, non distended, non-tender. No masses, no hepatomegaly. Normal bowel sounds Musculoskeletal: Symmetrical with no gross deformities  Extremities: No edema  Neurological: Alert oriented x 4, grossly nonfocal Psychological:  Alert and cooperative. Normal mood and affect  Assessment and Recommendations: 58 year old female with chronic constipation, IBS, gastroparesis, GERD, and hemorrhoids here for follow-up. She has been instructed to continue her current dose of Linzess. She was informed she may use Mira lax one capful  daily as needed, she can titrate the dose up or down as needed to achieve the desired effect. As she has been complaining of increased gas, she will discontinue Metamucil and instead tried Benefiber a heaping tablespoon daily. She will continue her domperidone and Dexilant. We will give her a course of Flagyl 250 mg 3 times daily for 10 days to treat for possible small intestinal bacterial overgrowth in light of her dysmotility and increased gas and bloating. She will try Levsin 0.125 mg 1 by mouth twice a day to use only on a when necessary basis as needed for cramping. We will renew her Canasa to use on an as-needed basis as well. She states she previously had a follow-up with Dr. In early December and she would like to keep this appointment as well.        Nivia Gervase, Tollie Pizza PA-C 11/19/2013,

## 2013-11-21 NOTE — Progress Notes (Signed)
Agree with initial assessment and plans 

## 2013-11-23 ENCOUNTER — Other Ambulatory Visit: Payer: BC Managed Care – PPO

## 2013-11-26 ENCOUNTER — Ambulatory Visit
Admission: RE | Admit: 2013-11-26 | Discharge: 2013-11-26 | Disposition: A | Discharge status: Home / Self Care | Payer: BC Managed Care – PPO | Source: Ambulatory Visit | Attending: Physician Assistant | Admitting: Physician Assistant

## 2013-11-26 DIAGNOSIS — N6320 Unspecified lump in the left breast, unspecified quadrant: Secondary | ICD-10-CM

## 2013-11-26 IMAGING — MG MM DIAG BREAST TOMO BILATERAL
4 series · 4 of 4 positions shown · non-contrast
Comparison: CT of the abdomen and pelvis [DATE]

CLINICAL DATA: Recent abdominal CT shows a left breast mass.

EXAM:
DIGITAL DIAGNOSTIC BILATERAL MAMMOGRAM WITH 3D TOMOSYNTHESIS WITH
CAD
ULTRASOUND LEFT BREAST

[R MLO]
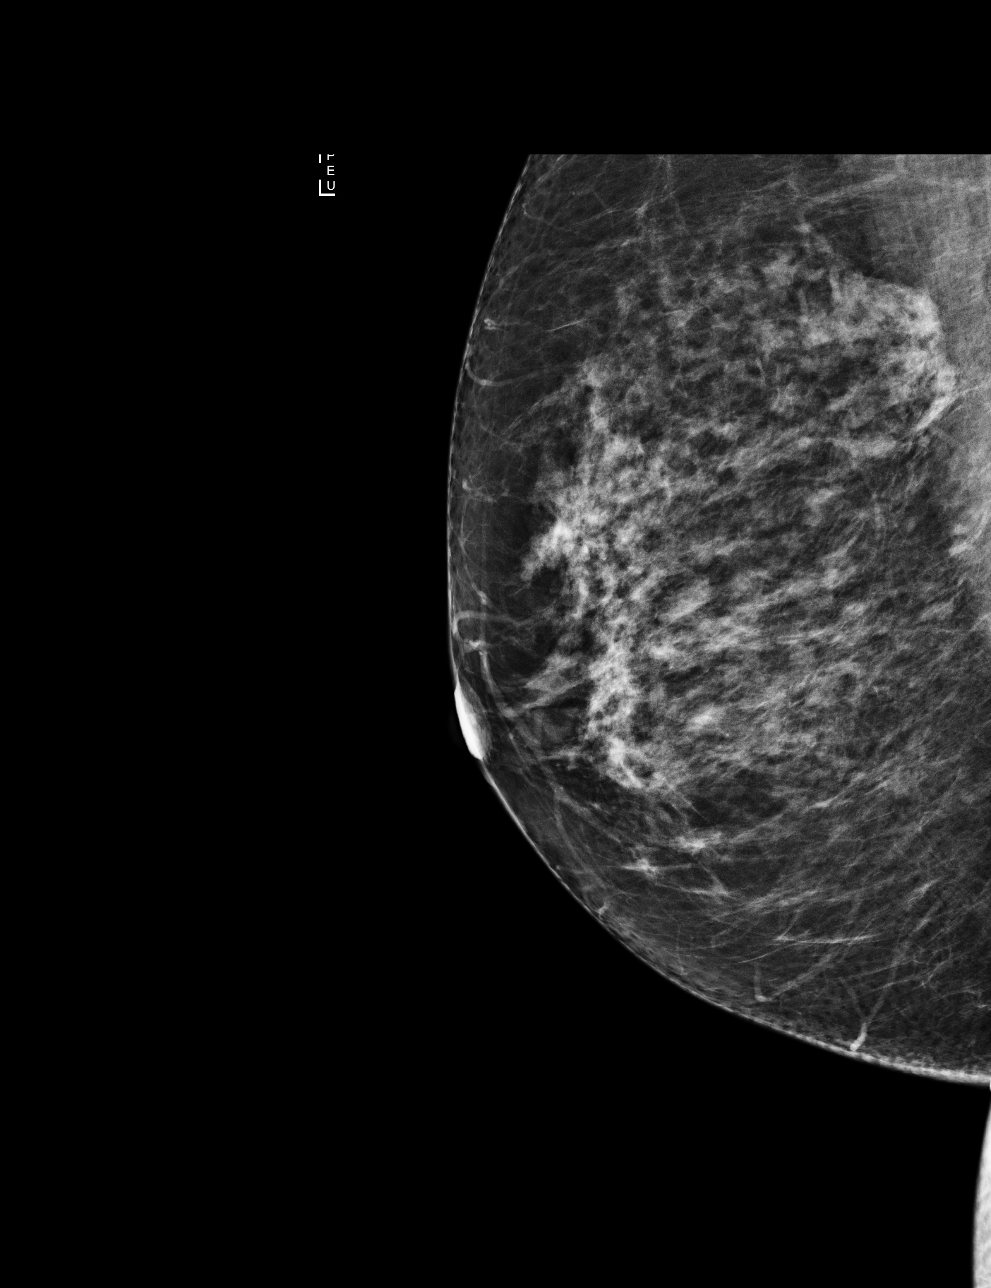

[L MLO]
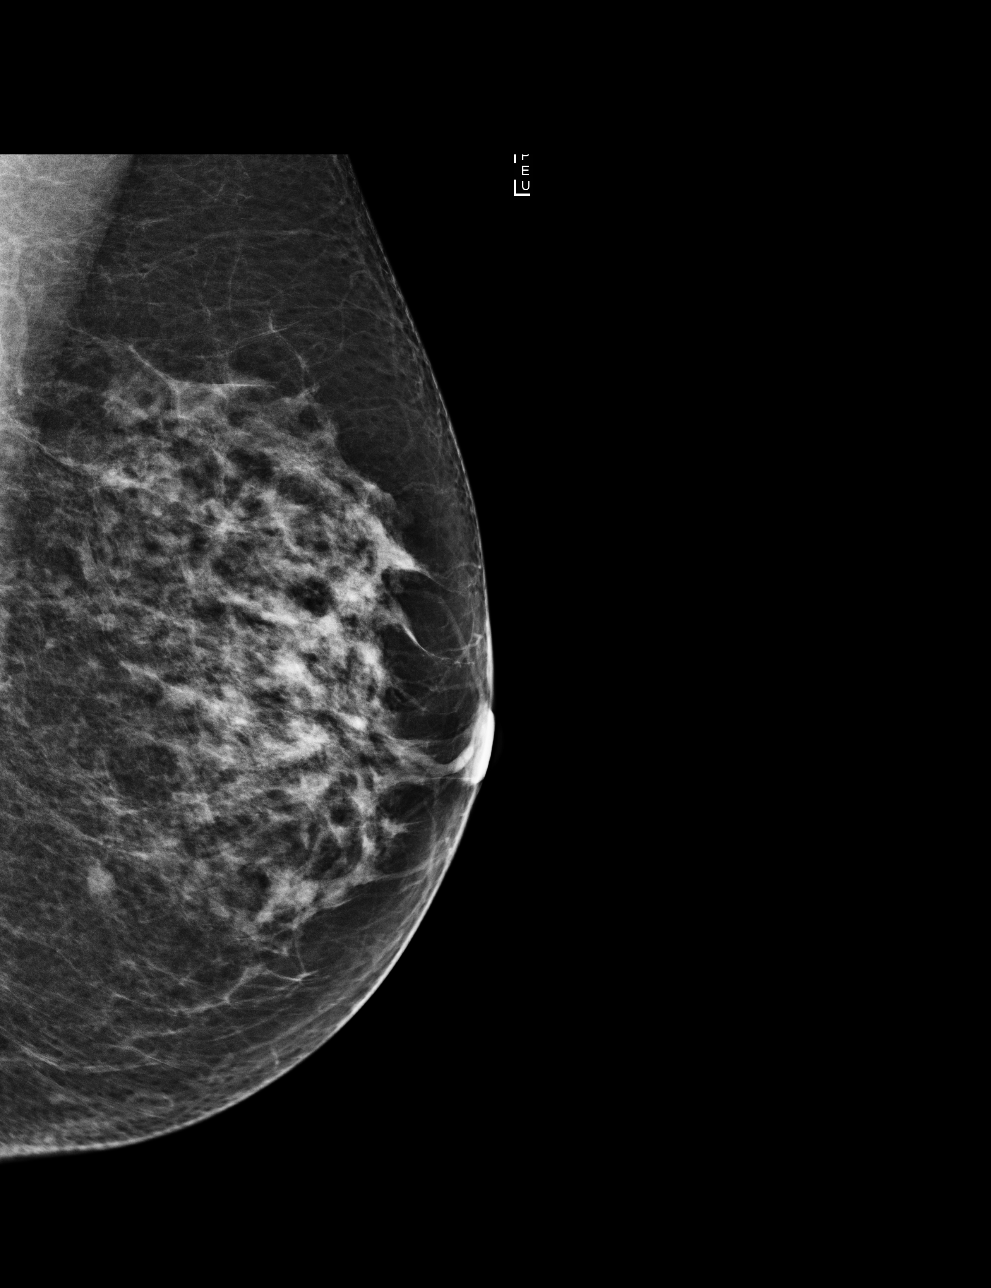

[R CC]
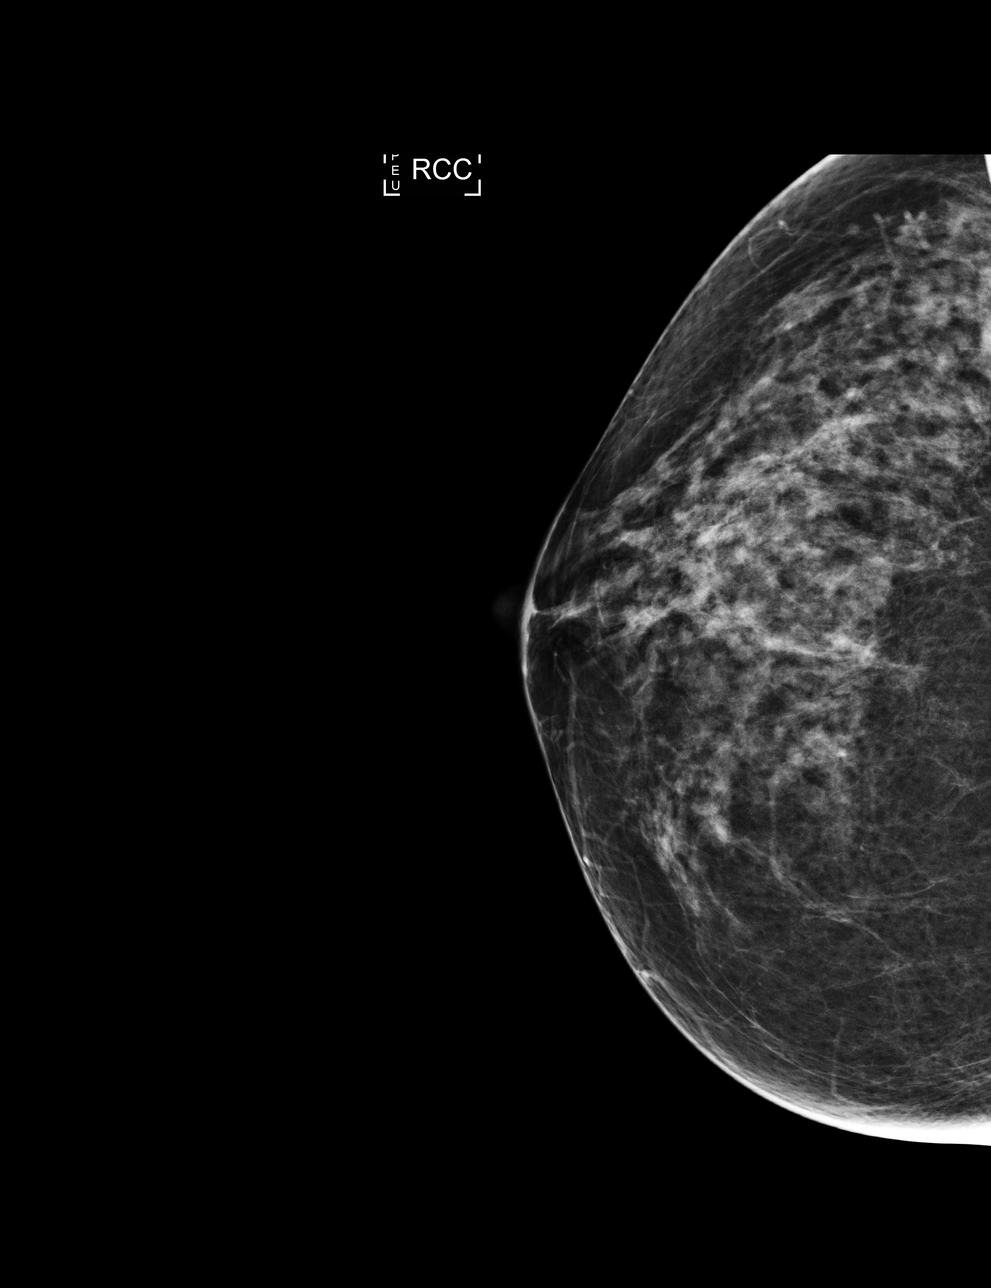

[L CC]
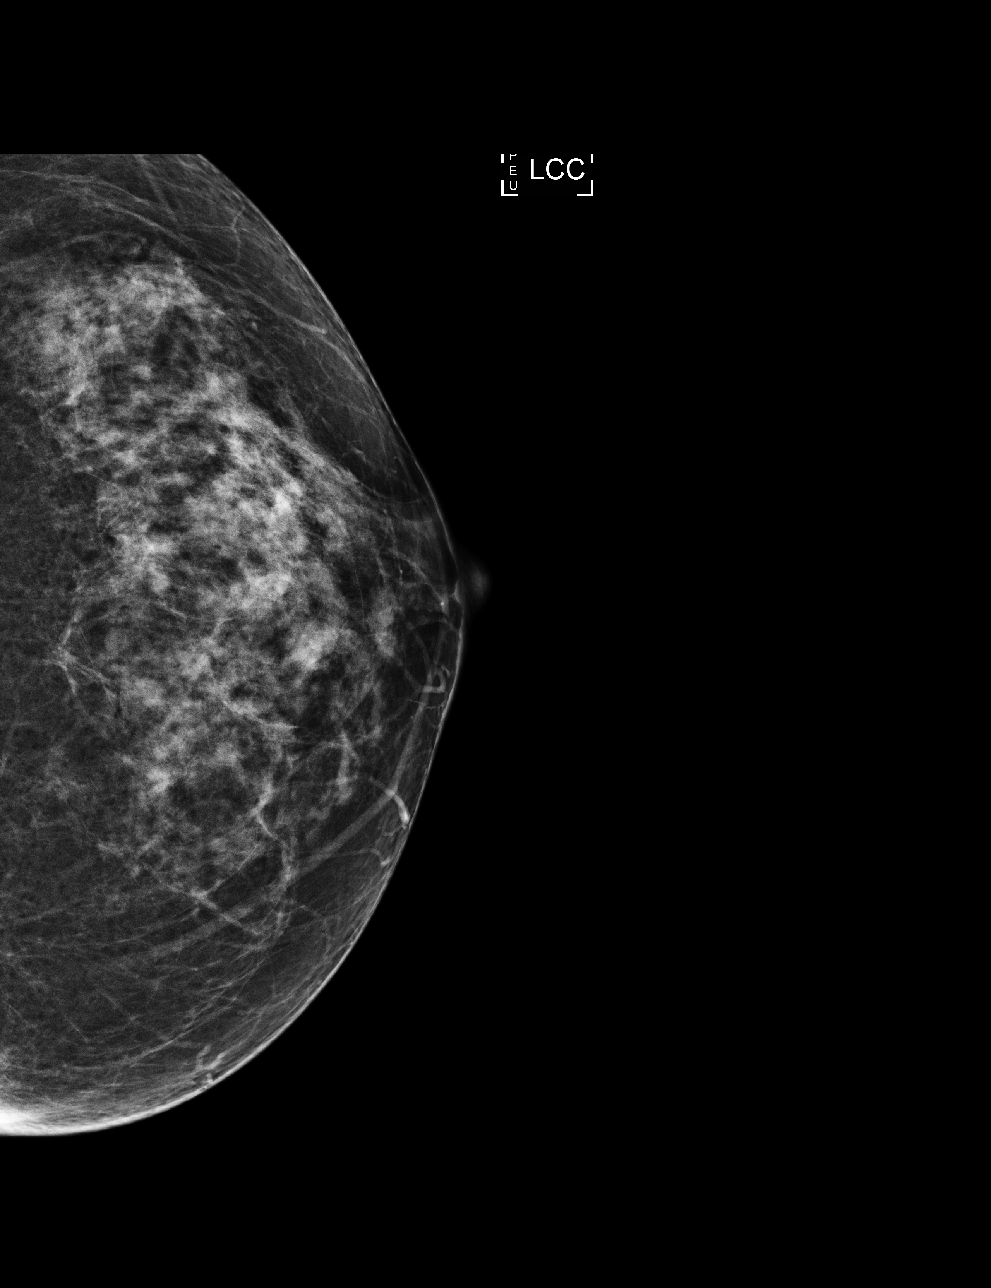

[4 of 4 positions shown; findings below may reference images not displayed]

ACR Breast Density Category c: The breast tissue is heterogeneously
dense, which may obscure small masses.
FINDINGS: Within the lower central portion of the left breast there is a small
circumscribed mass measuring approximately 4 mm. A partially
obscured nodule in the lower central portion of the left breast
measures approximately 9 mm. The right breast is negative.

Mammographic images were processed with CAD.

On physical exam, I palpate no abnormality in the lower central
portion of the left breast.

Ultrasound is performed, showing a cluster of cysts with mobile
internal debris in the 6 o'clock location of the left breast 2 cm
from the nipple which measures 0.9 x 0.5 x 1.0 cm. In the 6 o'clock
location 4 cm from the nipple a small cyst is 0.4 x 0.3 x 0.4 cm. No
solid mass or acoustic shadowing identified.
IMPRESSION: 1. There are 2 findings in the lower portion of the left breast.
2. The smaller lesion is probably occult on the CT scan and
represents a small cyst in the 6 o'clock location.
3. The larger fibrocystic complex in the 6 o'clock location likely
represents the CT finding and warrants follow-up.

RECOMMENDATION:
Left breast ultrasound is suggested in 6 months.

I have discussed the findings and recommendations with the patient.
Results were also provided in writing at the conclusion of the
visit. If applicable, a reminder letter will be sent to the patient
regarding the next appointment.

BI-RADS CATEGORY  3: Probably benign.

## 2013-11-26 IMAGING — US US BREAST LTD UNI LEFT INC AXILLA
1 series · 11 of 11 positions shown · non-contrast
Comparison: CT of the abdomen and pelvis [DATE]

CLINICAL DATA: Recent abdominal CT shows a left breast mass.

EXAM:
DIGITAL DIAGNOSTIC BILATERAL MAMMOGRAM WITH 3D TOMOSYNTHESIS WITH
CAD
ULTRASOUND LEFT BREAST

[Series 1: us breast ltd uni left inc axilla · 11 of 11 slices shown]
[im 1/11]
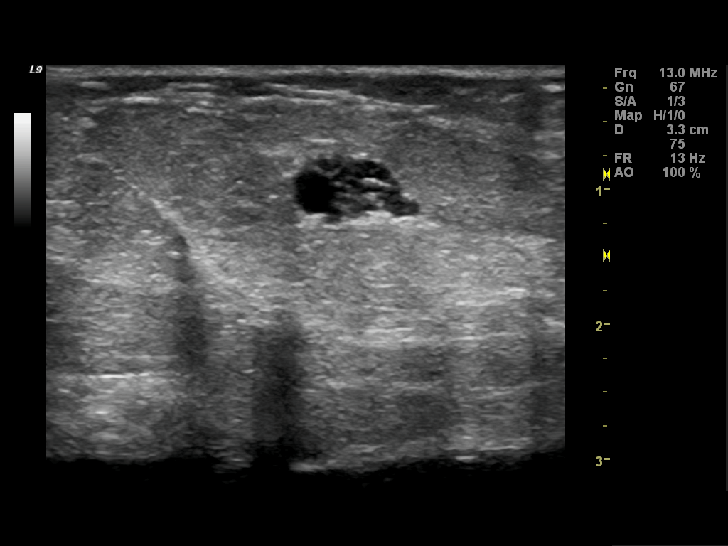
[im 2/11]
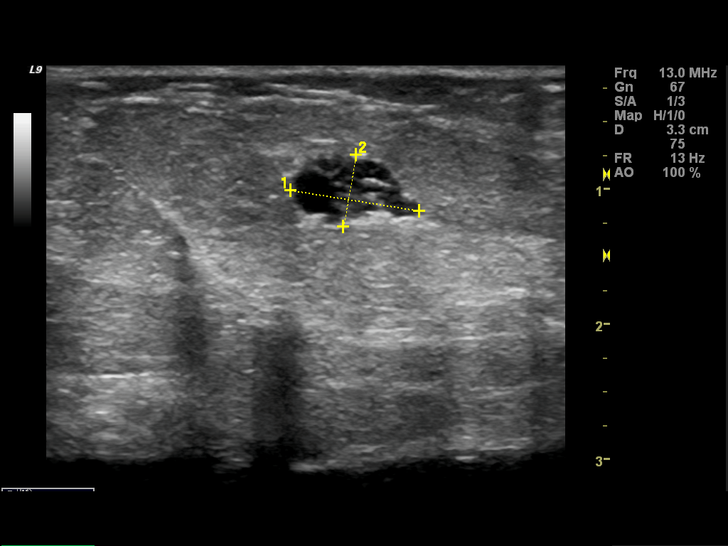
[im 3/11]
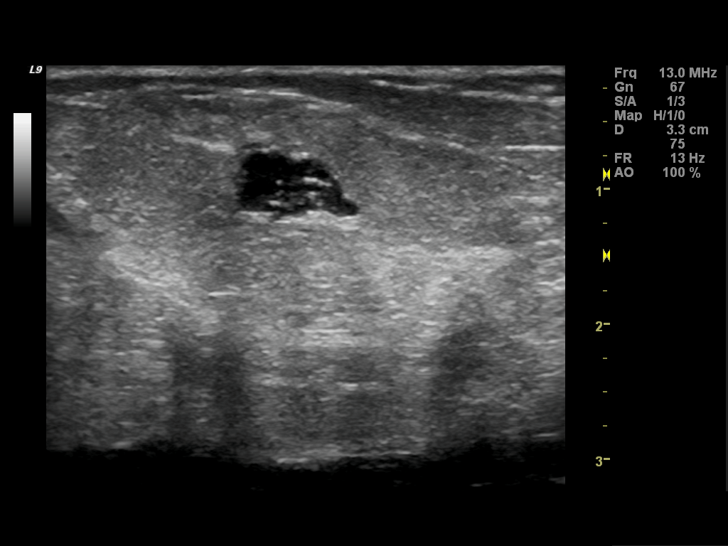
[im 4/11]
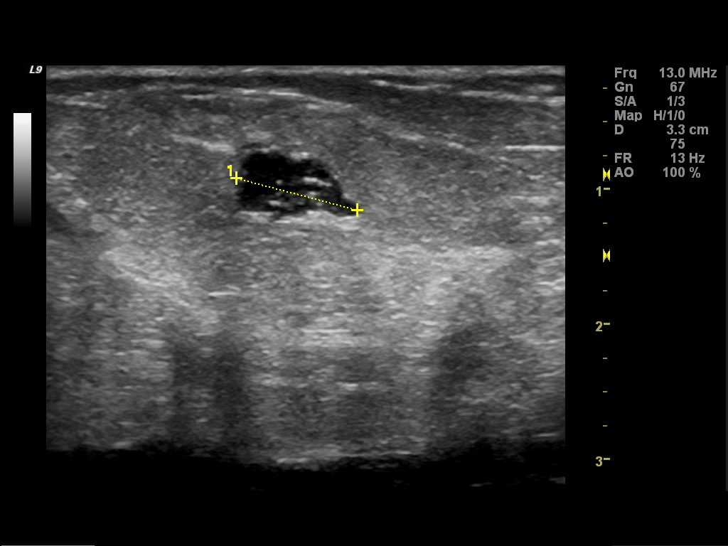
[im 5/11]
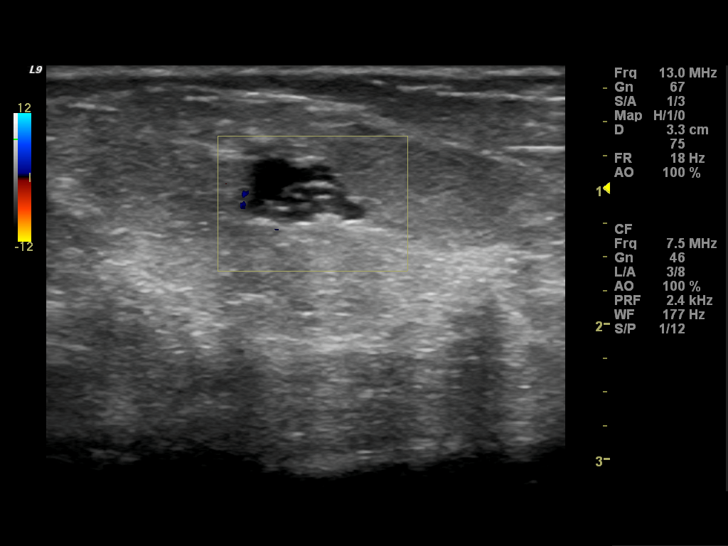
[im 6/11]
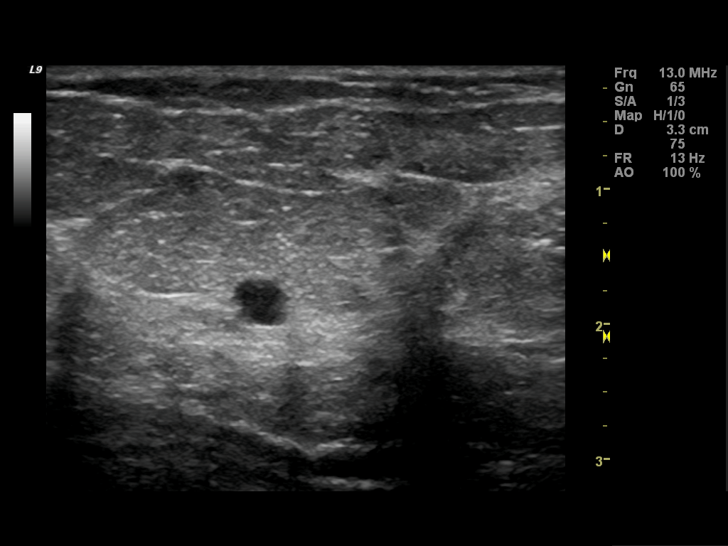
[im 7/11]
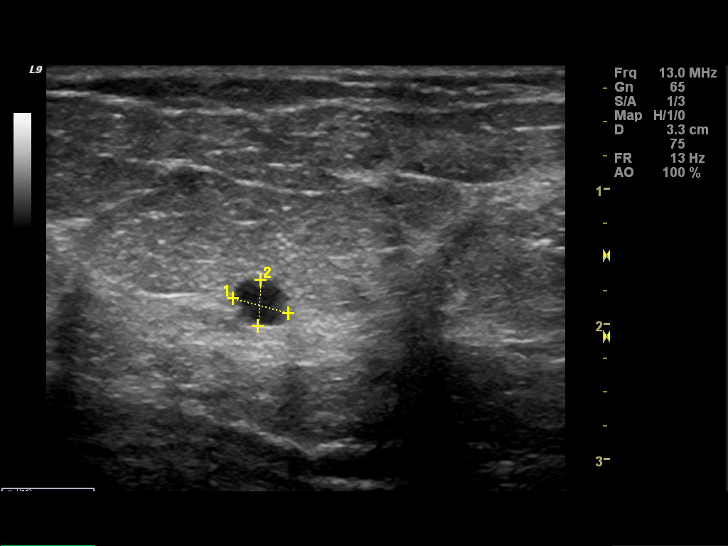
[im 8/11]
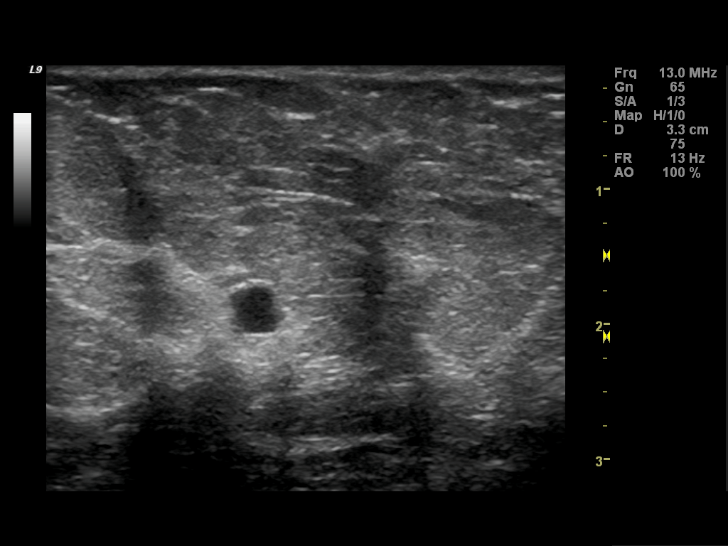
[im 9/11]
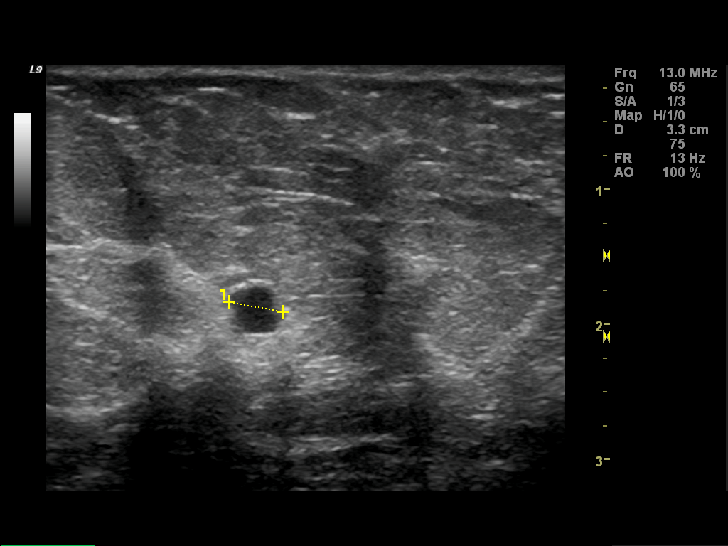
[im 10/11]
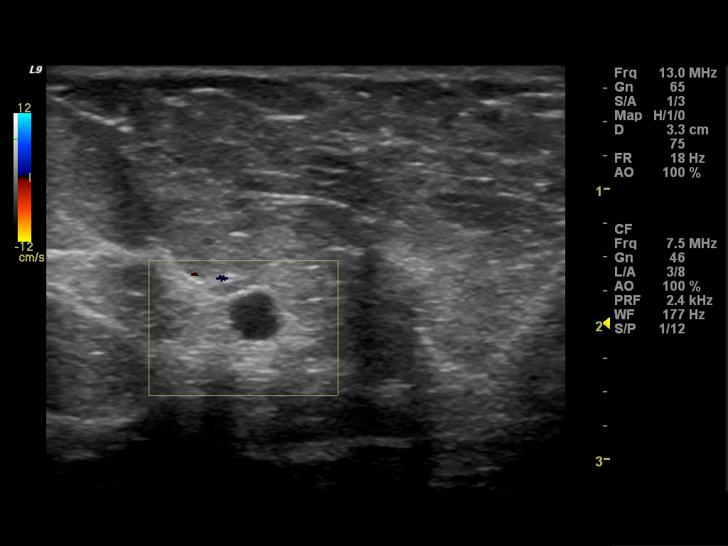
[im 11/11]
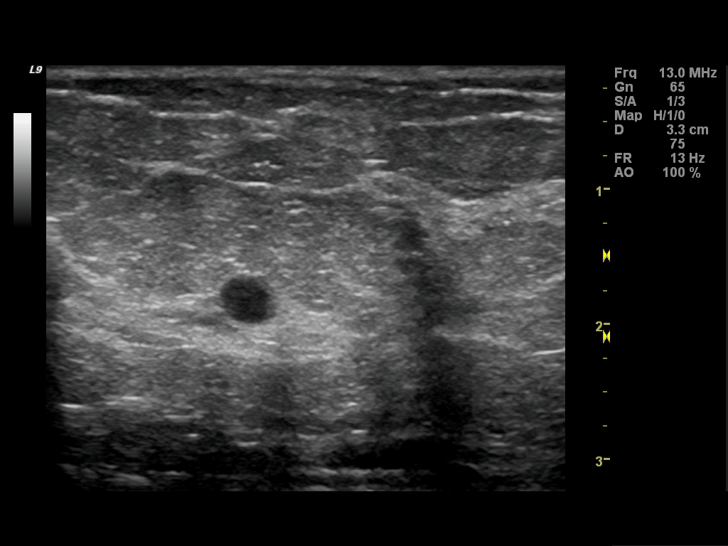

[11 of 11 positions shown; findings below may reference images not displayed]

ACR Breast Density Category c: The breast tissue is heterogeneously
dense, which may obscure small masses.
FINDINGS: Within the lower central portion of the left breast there is a small
circumscribed mass measuring approximately 4 mm. A partially
obscured nodule in the lower central portion of the left breast
measures approximately 9 mm. The right breast is negative.

Mammographic images were processed with CAD.

On physical exam, I palpate no abnormality in the lower central
portion of the left breast.

Ultrasound is performed, showing a cluster of cysts with mobile
internal debris in the 6 o'clock location of the left breast 2 cm
from the nipple which measures 0.9 x 0.5 x 1.0 cm. In the 6 o'clock
location 4 cm from the nipple a small cyst is 0.4 x 0.3 x 0.4 cm. No
solid mass or acoustic shadowing identified.
IMPRESSION: 1. There are 2 findings in the lower portion of the left breast.
2. The smaller lesion is probably occult on the CT scan and
represents a small cyst in the 6 o'clock location.
3. The larger fibrocystic complex in the 6 o'clock location likely
represents the CT finding and warrants follow-up.

RECOMMENDATION:
Left breast ultrasound is suggested in 6 months.

I have discussed the findings and recommendations with the patient.
Results were also provided in writing at the conclusion of the
visit. If applicable, a reminder letter will be sent to the patient
regarding the next appointment.

BI-RADS CATEGORY  3: Probably benign.

## 2013-12-07 ENCOUNTER — Ambulatory Visit (INDEPENDENT_AMBULATORY_CARE_PROVIDER_SITE_OTHER): Payer: BC Managed Care – PPO | Admitting: Internal Medicine

## 2013-12-07 ENCOUNTER — Encounter: Payer: Self-pay | Admitting: Internal Medicine

## 2013-12-07 VITALS — BP 116/78 | HR 84 | Ht 60.0 in | Wt 108.0 lb

## 2013-12-07 DIAGNOSIS — K3184 Gastroparesis: Secondary | ICD-10-CM

## 2013-12-07 NOTE — Patient Instructions (Signed)
Please follow up with Dr. Perry in one year 

## 2013-12-07 NOTE — Progress Notes (Signed)
HISTORY OF PRESENT ILLNESS:  Ruth Terry is a 58 y.o. female , previous patient of Dr. Verl Blalock, who established with me as a new patient January 2015. She has been diagnosed with motility disorder manifested by delayed gastric emptying (gastric emptying scan in May 2012) and constipation. She also has GERD and intermittent problems with hemorrhoids. She is on a number of GI agents chronically including probiotic align, Dulcolax, Linzess, domperidone, and Dexilant. As needed medications include hydrocortisone suppositories and sublingual Levsin. Her last upper endoscopy and colonoscopy were performed in April 2012 with no significant abnormalities. Negative duodenal biopsies. In early November spell problems with abdominal discomfort and increased gas. Also problems with hemorrhoids. She was seen in the emergency room with negative workup including CT scan. Seen by our physician assistant 2 weeks ago. Prescribe sublingual Levsin and hydrocortisone suppositories. Had this appointment previously which she wishes to keep. She has number of questions regarding "what can I do to make my irritable bowel better?" Complaints now on include rumbling with gas. She is moving her bowels twice daily. No nausea or vomiting. Weight is stable. No bleeding. Looks well  REVIEW OF SYSTEMS:  All non-GI ROS negative upon complete review  Past Medical History  Diagnosis Date  . MVP (mitral valve prolapse)   . IBS (irritable bowel syndrome)   . Hemorrhoids   . Hyperlipidemia   . GERD (gastroesophageal reflux disease)   . Gastroparesis   . Polyp, stomach 04-20-10    egd  . Diverticulosis 04-20-10    colonoscopy  . UTI (lower urinary tract infection)   . Chronic constipation     History reviewed. No pertinent past surgical history.  Social History Ruth Terry  reports that she has quit smoking. Her smoking use included Cigarettes. She has a 3 pack-year smoking history. She uses smokeless tobacco. She  reports that she drinks alcohol. She reports that she does not use illicit drugs.  family history includes Diabetes in her father; Heart attack in her father; Hypertension in her mother.  Allergies  Allergen Reactions  . Chlordiazepoxide-Clidinium   . Ciprofloxacin   . Sulfa Drugs Cross Reactors Nausea Only  . Nitrofurantoin Monohyd Macro Rash       PHYSICAL EXAMINATION: Vital signs: BP 116/78 mmHg  Pulse 84  Ht 5' (1.524 m)  Wt 108 lb (48.988 kg)  BMI 21.09 kg/m2 General: Well-developed, well-nourished, no acute distress HEENT: Sclerae are anicteric, conjunctiva pink. Oral mucosa intact Lungs: Clear Heart: Regular Abdomen: soft, nontender, nondistended, no obvious ascites, no peritoneal signs, normal bowel sounds. No organomegaly. Extremities: No edema Psychiatric: alert and oriented x3. Cooperative     ASSESSMENT:  #1. Gastroparesis #2. Constipation predominant IBS #3. GERD #4. Hemorrhoids #5. Unremarkable colonoscopy and upper endoscopy 2012   PLAN:  #1. Continue domperidone #2. Continue Linzess #3. Continue Dexilant #4. Hemorrhoidal suppositories as needed #5. Routine follow-up one year

## 2014-01-10 ENCOUNTER — Telehealth: Payer: Self-pay | Admitting: Internal Medicine

## 2014-01-16 ENCOUNTER — Other Ambulatory Visit: Payer: Self-pay

## 2014-01-16 MED ORDER — LINACLOTIDE 145 MCG PO CAPS: 145.0000 ug | ORAL_CAPSULE | Freq: Every day | ORAL | Status: DC

## 2014-01-16 NOTE — Telephone Encounter (Signed)
Prior authorization approved for Linzess.  Sent rx to [facility].  Called patient to let her know.  Patient acknowledged and understood

## 2014-01-16 NOTE — Telephone Encounter (Signed)
Left message on voice mail asking for patient to specify medications that need prior authorizations and prior authorization information if she had it.

## 2014-01-30 ENCOUNTER — Encounter: Payer: Self-pay | Admitting: Cardiology

## 2014-02-06 ENCOUNTER — Telehealth: Payer: Self-pay | Admitting: Internal Medicine

## 2014-02-06 NOTE — Telephone Encounter (Signed)
Pt states she saw her PCP and has been using suppositories for about 8 days but her hemorrhoids are not getting any better. Pt states she is still in a lot of pain and they are burning and hurting. Pt scheduled to see Cecille Rubin Hvozdovic, PA-C tomorrow at 1:15pm. Pt aware of appt.

## 2014-02-07 ENCOUNTER — Encounter: Payer: Self-pay | Admitting: Physician Assistant

## 2014-02-07 ENCOUNTER — Ambulatory Visit (INDEPENDENT_AMBULATORY_CARE_PROVIDER_SITE_OTHER): Payer: 59 | Admitting: Physician Assistant

## 2014-02-07 VITALS — BP 132/70 | HR 100 | Ht 60.0 in | Wt 106.0 lb

## 2014-02-07 DIAGNOSIS — K644 Residual hemorrhoidal skin tags: Secondary | ICD-10-CM

## 2014-02-07 DIAGNOSIS — K602 Anal fissure, unspecified: Secondary | ICD-10-CM

## 2014-02-07 DIAGNOSIS — K648 Other hemorrhoids: Secondary | ICD-10-CM

## 2014-02-07 MED ORDER — DILTIAZEM GEL 2 %: 1.0000 "application " | Freq: Two times a day (BID) | CUTANEOUS | Status: DC

## 2014-02-07 MED ORDER — LIDOCAINE-HYDROCORTISONE ACE 3-0.5 % RE CREA
1.0000 | TOPICAL_CREAM | Freq: Two times a day (BID) | RECTAL | Status: DC
Start: 2014-02-07 — End: 2014-02-08

## 2014-02-07 NOTE — Patient Instructions (Addendum)
Please discontinue Anusol suppositories.  Please use Sitz baths. We have given you information on how to do this.  Use Tucks wipes. These can be purchased over the counter.  Please call our office next week with an update on how you are doing.  Our phone number is [phone].  We have sent analmantle cream to your pharmacy. Apply to external hemorrhoids twice daily x 10 days.  We have sent diltiazem 2% to [facility] for you to pick up.  Hemorrhoidal handout given.  CC:Dr Melina Copa

## 2014-02-07 NOTE — Progress Notes (Signed)
Patient ID: Ruth Terry, female   DOB: 1955-04-30, 59 y.o.   MRN: [id number]     History of Present Illness:    Ruth Terry is a pleasant 59 year old female who was last seen here in early December by Dr.. She has a history of motility disorder manifested by delayed gastric emptying and constipation. She has a history of GERD and hemorrhoids as well. Her last upper endoscopy and colonoscopy were performed in April 2012 with no significant abnormalities. She was seen in November with abdominal discomfort gas and hemorrhoids. She was prescribed sublingual Levsin and hydrocortisone suppositories. She states that her hemorrhoids improved. She is here today with recurrent rectal discomfort she became constipated a couple of weeks ago and strain to move some hard stools. Since then she has some rectal discomfort and soreness she has not had bleeding but she feels a lump near the rectum and has a throbbing sensation in the rectum she has no abdominal pain.   Past Medical History  Diagnosis Date  . MVP (mitral valve prolapse)   . IBS (irritable bowel syndrome)   . Hemorrhoids   . Hyperlipidemia   . GERD (gastroesophageal reflux disease)   . Gastroparesis   . Polyp, stomach 04-20-10    egd  . Diverticulosis 04-20-10    colonoscopy  . UTI (lower urinary tract infection)   . Chronic constipation     History reviewed. No pertinent past surgical history. Family History  Problem Relation Age of Onset  . Hypertension Mother   . Diabetes Father   . Heart attack Father     Age 95   History  Substance Use Topics  . Smoking status: Former Smoker -- 0.50 packs/day for 6 years    Types: Cigarettes  . Smokeless tobacco: Never Used     Comment: USE SMOKELESS CIGARETTES  . Alcohol Use: 0.0 oz/week    0 Not specified per week     Comment: 1 a week   Current Outpatient Prescriptions  Medication Sig Dispense Refill  . alfuzosin (UROXATRAL) 10 MG 24 hr tablet Take 5 mg by mouth daily.     .  AMBULATORY NON FORMULARY MEDICATION Domperidone 10mg  take one at bedtime 100 tablet 3  . bisacodyl (DULCOLAX) 5 MG EC tablet Take 5 mg by mouth daily as needed for moderate constipation.    Marland Kitchen DEXILANT 60 MG capsule TAKE ONE (1) CAPSULE EACH DAY 90 capsule 1  . Linaclotide (LINZESS) 145 MCG CAPS capsule Take 1 capsule (145 mcg total) by mouth daily. 30 capsule 6  . Meth-Hyo-M Bl-Na Phos-Ph Sal (URIBEL) 118 MG CAPS Take 1 capsule by mouth as needed.    . Probiotic Product (ALIGN) 4 MG CAPS Take 1 capsule by mouth daily.    . psyllium (METAMUCIL) 58.6 % packet Take 1 packet by mouth daily.    Marland Kitchen diltiazem 2 % GEL Apply 1 application topically 2 (two) times daily. X 4 weeks 30 g 0  . lidocaine-hydrocortisone (ANAMANTEL HC) 3-0.5 % CREA Place 1 Applicatorful rectally 2 (two) times daily. X 10 days 30 g 0   No current facility-administered medications for this visit.   Allergies  Allergen Reactions  . Chlordiazepoxide-Clidinium   . Ciprofloxacin   . Sulfa Drugs Cross Reactors Nausea Only  . Nitrofurantoin Monohyd Macro Rash      Review of Systems: Gen: Denies any fever, chills, sweats, anorexia, fatigue, weakness, malaise, weight loss, and sleep disorder CV: Denies chest pain, angina, palpitations, syncope, orthopnea, PND, peripheral edema, and  claudication. Resp: Denies dyspnea at rest, dyspnea with exercise, cough, sputum, wheezing, coughing up blood, and pleurisy. GI: Denies vomiting blood, jaundice, and fecal incontinence.   Denies dysphagia or odynophagia. GU : Denies urinary burning, blood in urine, urinary frequency, urinary hesitancy, nocturnal urination, and urinary incontinence. MS: Denies joint pain, limitation of movement, and swelling, stiffness, low back pain, extremity pain. Denies muscle weakness, cramps, atrophy.  Derm: Denies rash, itching, dry skin, hives, moles, warts, or unhealing ulcers.  Psych: Denies depression, anxiety, memory loss, suicidal ideation, hallucinations,  paranoia, and confusion. Heme: Denies bruising, bleeding, and enlarged lymph nodes. Neuro:  Denies any headaches, dizziness, paresthesia Endo:  Denies any problems with DM, thyroid, adrenal    Physical Exam: General: Pleasant, well developed female in no acute distress Head: Normocephalic and atraumatic Eyes:  sclerae anicteric, conjunctiva pink  Ears: Normal auditory acuity Lungs: Clear throughout to auscultation Heart: Regular rate and rhythm Abdomen: Soft, non distended, non-tender. No masses, no hepatomegaly. Normal bowel sounds Rectal: external hemorrhoid, nonthrombosed but tender, anoscopy with a posterior fissure and an internal hemorrhoid Musculoskeletal: Symmetrical with no gross deformities  Extremities: No edema  Neurological: Alert oriented x 4, grossly nonfocal Psychological:  Alert and cooperative. Normal mood and affect  Assessment and Recommendations: 59 year old female with rectal pain likely due to her anal fissure and hemorrhoids. She will discontinue her Anusol suppositories. She will be given a trial of diltiazem 2% ointment to apply rectally twice a day for 4 weeks. For her external hemorrhoid she will try AnaMantle cream twice a day to 3 times a day for 10 days along with sitz baths, and Tucks wipes. She will call us next week to let us know how she is feeling.    Genea Rheaume, Tollie Pizza PA-C 02/07/2014,

## 2014-02-08 ENCOUNTER — Telehealth: Payer: Self-pay | Admitting: Physician Assistant

## 2014-02-08 MED ORDER — HYDROCORTISONE ACE-PRAMOXINE 2.5-1 % RE CREA: 1.0000 "application " | TOPICAL_CREAM | Freq: Two times a day (BID) | RECTAL | Status: DC

## 2014-02-08 NOTE — Telephone Encounter (Signed)
Lori-Please advise.Marland KitchenMarland KitchenMarland Kitchen

## 2014-02-08 NOTE — Telephone Encounter (Signed)
She can try analpram cream--same orders as anamantle

## 2014-02-08 NOTE — Progress Notes (Signed)
Agree 

## 2014-02-08 NOTE — Telephone Encounter (Signed)
New rx sent in place of anamantle. Patient advised.

## 2014-03-18 ENCOUNTER — Other Ambulatory Visit: Payer: Self-pay | Admitting: Internal Medicine

## 2014-03-25 ENCOUNTER — Telehealth: Payer: Self-pay | Admitting: Internal Medicine

## 2014-03-25 NOTE — Telephone Encounter (Signed)
Pt states she thinks she needs to be seen for abdominal pain and increased reflux. Requests appt with Cecille Rubin Hvozdovic, PA-C. Pt scheduled to see Cecille Rubin 03/27/14@2 :15pm. Pt aware of appt.

## 2014-03-27 ENCOUNTER — Ambulatory Visit (INDEPENDENT_AMBULATORY_CARE_PROVIDER_SITE_OTHER): Payer: 59 | Admitting: Physician Assistant

## 2014-03-27 ENCOUNTER — Encounter: Payer: Self-pay | Admitting: Physician Assistant

## 2014-03-27 VITALS — BP 106/62 | HR 58 | Ht 60.0 in | Wt 107.0 lb

## 2014-03-27 DIAGNOSIS — K589 Irritable bowel syndrome without diarrhea: Secondary | ICD-10-CM | POA: Diagnosis not present

## 2014-03-27 DIAGNOSIS — R140 Abdominal distension (gaseous): Secondary | ICD-10-CM | POA: Diagnosis not present

## 2014-03-27 MED ORDER — AMOXICILLIN-POT CLAVULANATE 600-42.9 MG/5ML PO SUSR: ORAL | Status: DC

## 2014-03-27 MED ORDER — HYOSCYAMINE SULFATE 0.125 MG SL SUBL: 0.1250 mg | SUBLINGUAL_TABLET | Freq: Three times a day (TID) | SUBLINGUAL | Status: DC | PRN

## 2014-03-27 NOTE — Patient Instructions (Signed)
Your physician has requested that you go to the basement for lab work before leaving today.  We have sent the following medications to your pharmacy for you to pick up at your convenience: augmentin and Levsin  Today you have been given a low residue diet to read and follow.   We have made you a follow up appointment with Cecille Rubin for April 28th 2016 at 2:15pm.    I appreciate the opportunity to care for you.

## 2014-03-27 NOTE — Progress Notes (Signed)
Patient ID: Ruth Terry, female   DOB: 1955-12-05, 59 y.o.   MRN: [id number]     History of Present Illness: Ruth Terry this pleasant 59 year old female who was last seen here in February 2016. She has a history of a motility disorder manifested by delayed gastric emptying and intermittent constipation. She has a history of GERD and hemorrhoids as well. Her last upper endoscopy and colonoscopy were performed in April 2012 with no significant abnormalities. She was seen in February with anal fissure and hemorrhoids. She was given a trial of diltiazem ointment. She says her rectal symptoms have totally resolved and she has had no bleeding or rectal pain. She is here today because for the past week she has had lower abdominal distention and discomfort. She states her lower abdomen is sore. She has had no associated change in her bowel habits or stool caliber, and has had no bright red blood per rectum or melena. Review of her chart shows that she had an abdominal CT in November 2015 with normal appearance of the bowel. She has no fever, chills or night sweats. She has no nausea or vomiting. She feels excessively bloated and gassy. Her discomfort is not alleviated with passage of gas or defecation. Her stools have not been oily. She says that every time she eats she gets very bloated. She has been using domperidone and says without this she is absolutely miserable. She saw her urologist earlier this week and urinalysis was negative.   Past Medical History  Diagnosis Date  . MVP (mitral valve prolapse)   . IBS (irritable bowel syndrome)   . Hemorrhoids   . Hyperlipidemia   . GERD (gastroesophageal reflux disease)   . Gastroparesis   . Polyp, stomach 04-20-10    egd  . Diverticulosis 04-20-10    colonoscopy  . UTI (lower urinary tract infection)   . Chronic constipation     History reviewed. No pertinent past surgical history. Family History  Problem Relation Age of Onset  . Hypertension  Mother   . Diabetes Father   . Heart attack Father     Age 64   History  Substance Use Topics  . Smoking status: Former Smoker -- 0.50 packs/day for 6 years    Types: Cigarettes  . Smokeless tobacco: Never Used     Comment: USE SMOKELESS CIGARETTES  . Alcohol Use: 0.0 oz/week    0 Standard drinks or equivalent per week     Comment: 1 a week   Current Outpatient Prescriptions  Medication Sig Dispense Refill  . alfuzosin (UROXATRAL) 10 MG 24 hr tablet Take 5 mg by mouth daily.     . AMBULATORY NON FORMULARY MEDICATION Domperidone 10mg  take one at bedtime 100 tablet 3  . bisacodyl (DULCOLAX) 5 MG EC tablet Take 5 mg by mouth daily as needed for moderate constipation.    Marland Kitchen DEXILANT 60 MG capsule TAKE ONE (1) CAPSULE EACH DAY 90 capsule 1  . Linaclotide (LINZESS) 145 MCG CAPS capsule Take 1 capsule (145 mcg total) by mouth daily. 30 capsule 6  . Meth-Hyo-M Bl-Na Phos-Ph Sal (URIBEL) 118 MG CAPS Take 1 capsule by mouth as needed.    . Probiotic Product (ALIGN) 4 MG CAPS Take 1 capsule by mouth daily.    . psyllium (METAMUCIL) 58.6 % packet Take 1 packet by mouth daily.    Marland Kitchen amoxicillin-clavulanate (AUGMENTIN ES-600) 600-42.9 MG/5ML suspension Take 7.41ml by mouth twice a day for 7 days 105 mL 0  .  hyoscyamine (LEVSIN SL) 0.125 MG SL tablet Place 1 tablet (0.125 mg total) under the tongue 3 (three) times daily as needed. 60 tablet 2   No current facility-administered medications for this visit.   Allergies  Allergen Reactions  . Chlordiazepoxide-Clidinium   . Ciprofloxacin   . Sulfa Drugs Cross Reactors Nausea Only  . Nitrofurantoin Monohyd Macro Rash      Review of Systems: Gen: Denies any fever, chills, sweats, anorexia, fatigue, weakness, malaise, weight loss, and sleep disorder CV: Denies chest pain, angina, palpitations, syncope, orthopnea, PND, peripheral edema, and claudication. Resp: Denies dyspnea at rest, dyspnea with exercise, cough, sputum, wheezing, coughing up blood,  and pleurisy. GI: Denies vomiting blood, jaundice, and fecal incontinence.   Denies dysphagia or odynophagia. GU : Denies urinary burning, blood in urine, urinary frequency, urinary hesitancy, nocturnal urination, and urinary incontinence. MS: Denies joint pain, limitation of movement, and swelling, stiffness, low back pain, extremity pain. Denies muscle weakness, cramps, atrophy.  Derm: Denies rash, itching, dry skin, hives, moles, warts, or unhealing ulcers.  Psych: Denies depression, anxiety, memory loss, suicidal ideation, hallucinations, paranoia, and confusion. Heme: Denies bruising, bleeding, and enlarged lymph nodes. Neuro:  Denies any headaches, dizziness, paresthesia Endo:  Denies any problems with DM, thyroid, adrenal     Studies:  Pelvis W Contrast   Status: Final result       PACS Images     Show images for CT Abdomen Pelvis W Contrast     Study Result     CLINICAL DATA: Pt. Is complaining of abdominal pain onset Thursday that she describes as "tender and sore". Pain worsened today. She also complains of constipation, with most recent BM on Friday. Pt adds that she normally has a couple BM's a day. Hiccups and movement of torso exacerbates pain.  EXAM: CT ABDOMEN AND PELVIS WITH CONTRAST  TECHNIQUE: Multidetector CT imaging of the abdomen and pelvis was performed using the standard protocol following bolus administration of intravenous contrast.  CONTRAST: 50mL OMNIPAQUE IOHEXOL 300 MG/ML SOLN, OMNIPAQUE IOHEXOL 300 MG/ML SOLN  COMPARISON: None.  FINDINGS: Minimal subsegmental atelectasis at the dependent lung bases. Heart is normal in size.  Normal liver, spleen, gallbladder and pancreas. No bile duct dilation. No adrenal masses  Normal kidneys, ureters and bladder.  Uterus and adnexa are unremarkable.  No pathologically enlarged lymph nodes. No abnormal fluid collections.  Colon and small bowel are unremarkable. Normal  appendix is visualized.  There is an 8 mm focal opacity in the inferior left breast.  No significant bony abnormality.  IMPRESSION: 1. No acute findings. 2. Normal appearance of the bowel. No significant increased stool in the colon. 3. 8 mm possible small left breast mass. Recommend correlation with non urgent mammography if this has not been recently performed.   Electronically Signed  By: Amie Portland M.D.  On: 11/11/2013 16:40       Physical Exam: General: Pleasant, well developed , female in no acute distress Head: Normocephalic and atraumatic Eyes:  sclerae anicteric, conjunctiva pink  Ears: Normal auditory acuity Lungs: Clear throughout to auscultation Heart: Regular rate and rhythm Abdomen: Soft, non distended, mild diffuse lower abd tenderness No masses, no hepatomegaly. Normal bowel sounds Musculoskeletal: Symmetrical with no gross deformities  Extremities: No edema  Neurological: Alert oriented x 4, grossly nonfocal Psychological:  Alert and cooperative. Normal mood and affect  Assessment and Recommendations: 59 year old female with a history of IBS now with 1 week of lower abdominal discomfort and bloating. Pancreatic fecal elastase  will be obtained and if low she will be given a trial of Creon. A CBC will be obtained .Celiac studies in 2012 were all normal.. Patient has tried Levsin in the past for possible bacterial overgrowth and says it provided no relief. We will give her a trial of Augmentin suspension 600/42.9 mg per 5 ML's, 7.5 ML's by mouth twice daily for 7 days. She will also be given a trial of Levsin sublingual 0.125 mg, 1 sublingual 3 times a day when necessary. She will adhere to a low residue diet for one week and then slowly start to reintroduce fiber. She will follow up in one month, sooner if needed.       Fabion Gatson, Moise Boring 03/27/2014,

## 2014-04-02 ENCOUNTER — Other Ambulatory Visit (INDEPENDENT_AMBULATORY_CARE_PROVIDER_SITE_OTHER): Payer: 59

## 2014-04-02 DIAGNOSIS — K589 Irritable bowel syndrome without diarrhea: Secondary | ICD-10-CM | POA: Diagnosis not present

## 2014-04-02 DIAGNOSIS — R140 Abdominal distension (gaseous): Secondary | ICD-10-CM

## 2014-04-02 LAB — CBC WITH DIFFERENTIAL/PLATELET
BASOS ABS: 0 10*3/uL (ref 0.0–0.1)
BASOS PCT: 0.5 % (ref 0.0–3.0)
EOS ABS: 0.1 10*3/uL (ref 0.0–0.7)
Eosinophils Relative: 3.1 % (ref 0.0–5.0)
HEMATOCRIT: 42.2 % (ref 36.0–46.0)
HEMOGLOBIN: 14.7 g/dL (ref 12.0–15.0)
LYMPHS PCT: 21.3 % (ref 12.0–46.0)
Lymphs Abs: 0.8 10*3/uL (ref 0.7–4.0)
MCHC: 34.9 g/dL (ref 30.0–36.0)
MCV: 89 fl (ref 78.0–100.0)
MONOS PCT: 8.7 % (ref 3.0–12.0)
Monocytes Absolute: 0.3 10*3/uL (ref 0.1–1.0)
NEUTROS PCT: 66.4 % (ref 43.0–77.0)
Neutro Abs: 2.6 10*3/uL (ref 1.4–7.7)
PLATELETS: 200 10*3/uL (ref 150.0–400.0)
RBC: 4.74 Mil/uL (ref 3.87–5.11)
RDW: 14 % (ref 11.5–15.5)
WBC: 3.9 10*3/uL — AB (ref 4.0–10.5)

## 2014-04-02 NOTE — Progress Notes (Signed)
Agree 

## 2014-04-10 ENCOUNTER — Telehealth: Payer: Self-pay | Admitting: Physician Assistant

## 2014-04-10 LAB — PANCREATIC ELASTASE, FECAL: Pancreatic Elastase-1, Stool: >500 mcg/g

## 2014-04-10 NOTE — Telephone Encounter (Signed)
Spoke with patient and

## 2014-04-10 NOTE — Telephone Encounter (Signed)
Spoke with patient and she is asking for rest of lab results. Patient told they have not been reviewed yet.

## 2014-05-02 ENCOUNTER — Ambulatory Visit: Payer: 59 | Admitting: Physician Assistant

## 2014-05-13 ENCOUNTER — Telehealth: Payer: Self-pay | Admitting: Internal Medicine

## 2014-05-13 NOTE — Telephone Encounter (Signed)
Pt states she is having a lot of rectal pressure and pain and thinks she may have a tear around the rectum. Pt scheduled to see Cecille Rubin Hvozdovic, PA-C tomorrow at 2:45pm. Pt aware of appt.

## 2014-05-14 ENCOUNTER — Ambulatory Visit (INDEPENDENT_AMBULATORY_CARE_PROVIDER_SITE_OTHER): Payer: 59 | Admitting: Physician Assistant

## 2014-05-14 ENCOUNTER — Encounter: Payer: Self-pay | Admitting: Physician Assistant

## 2014-05-14 VITALS — BP 100/60 | HR 78 | Ht 60.0 in | Wt 107.4 lb

## 2014-05-14 DIAGNOSIS — K644 Residual hemorrhoidal skin tags: Secondary | ICD-10-CM

## 2014-05-14 DIAGNOSIS — K648 Other hemorrhoids: Secondary | ICD-10-CM | POA: Diagnosis not present

## 2014-05-14 MED ORDER — LIDOCAINE-HYDROCORTISONE ACE 3-2.5 % RE KIT: 1.0000 "application " | PACK | Freq: Two times a day (BID) | RECTAL | Status: DC

## 2014-05-14 MED ORDER — HYDROCORTISONE ACETATE 25 MG RE SUPP: 25.0000 mg | Freq: Two times a day (BID) | RECTAL | Status: DC

## 2014-05-14 NOTE — Progress Notes (Signed)
   History of Present Illness: Ruth Terry is a pleasant 58-year-old female with a history of a motility disorder manifested by delayed gastric emptying and intermittent constipation. She has a history of hemorrhoids as well. She was seen in February with anal fissure and hemorrhoids and given a trial of diltiazem ointment with good relief. She has been doing well since her last visit but states for the past 2 weeks she became constipated despite using Linzess. She started using Metamucil and is now her bowels 2-3 times a day. Her bowel movements are formed. She does not feel a tearing sensation or as if she is passing razor blades, but she has a soreness near the rectum with a small bump. She has had no bright red blood per rectum or melena. She has no fever or chills.   Past Medical History  Diagnosis Date  . MVP (mitral valve prolapse)   . IBS (irritable bowel syndrome)   . Hemorrhoids   . Hyperlipidemia   . GERD (gastroesophageal reflux disease)   . Gastroparesis   . Polyp, stomach 04-20-10    egd  . Diverticulosis 04-20-10    colonoscopy  . UTI (lower urinary tract infection)   . Chronic constipation     History reviewed. No pertinent past surgical history. Family History  Problem Relation Age of Onset  . Hypertension Mother   . Diabetes Father   . Heart attack Father     Age 60   History  Substance Use Topics  . Smoking status: Former Smoker -- 0.50 packs/day for 6 years    Types: Cigarettes  . Smokeless tobacco: Never Used     Comment: USE SMOKELESS CIGARETTES  . Alcohol Use: 0.0 oz/week    0 Standard drinks or equivalent per week     Comment: 1 a week   Current Outpatient Prescriptions  Medication Sig Dispense Refill  . alfuzosin (UROXATRAL) 10 MG 24 hr tablet Take 5 mg by mouth daily.     . AMBULATORY NON FORMULARY MEDICATION Domperidone 10mg take one at bedtime 100 tablet 3  . bisacodyl (DULCOLAX) 5 MG EC tablet Take 5 mg by mouth daily as needed for moderate  constipation.    . DEXILANT 60 MG capsule TAKE ONE (1) CAPSULE EACH DAY 90 capsule 1  . hyoscyamine (LEVSIN SL) 0.125 MG SL tablet Place 1 tablet (0.125 mg total) under the tongue 3 (three) times daily as needed. 60 tablet 2  . Linaclotide (LINZESS) 145 MCG CAPS capsule Take 1 capsule (145 mcg total) by mouth daily. 30 capsule 6  . Meth-Hyo-M Bl-Na Phos-Ph Sal (URIBEL) 118 MG CAPS Take 1 capsule by mouth as needed.    . Probiotic Product (ALIGN) 4 MG CAPS Take 1 capsule by mouth daily.    . psyllium (METAMUCIL) 58.6 % packet Take 1 packet by mouth daily.    . hydrocortisone (ANUSOL-HC) 25 MG suppository Place 1 suppository (25 mg total) rectally every 12 (twelve) hours. For 7 days 14 suppository 1  . Lidocaine-Hydrocortisone Ace 3-2.5 % KIT Place 1 application rectally 2 (two) times daily. For 7 days 1 each 0   No current facility-administered medications for this visit.   Allergies  Allergen Reactions  . Chlordiazepoxide-Clidinium   . Ciprofloxacin   . Sulfa Drugs Cross Reactors Nausea Only  . Nitrofurantoin Monohyd Macro Rash      Review of Systems: Gen: Denies any fever, chills, sweats, anorexia, fatigue, weakness, malaise, weight loss, and sleep disorder CV: Denies chest pain,   angina, palpitations, syncope, orthopnea, PND, peripheral edema, and claudication. Resp: Denies dyspnea at rest, dyspnea with exercise, cough, sputum, wheezing, coughing up blood, and pleurisy. GI: Denies vomiting blood, jaundice, and fecal incontinence.   Denies dysphagia or odynophagia. GU : Denies urinary burning, blood in urine, urinary frequency, urinary hesitancy, nocturnal urination, and urinary incontinence. MS: Denies joint pain, limitation of movement, and swelling, stiffness, low back pain, extremity pain. Denies muscle weakness, cramps, atrophy.  Derm: Denies rash, itching, dry skin, hives, moles, warts, or unhealing ulcers.  Psych: Denies depression, anxiety, memory loss, suicidal ideation,  hallucinations, paranoia, and confusion. Heme: Denies bruising, bleeding, and enlarged lymph nodes. Neuro:  Denies any headaches, dizziness, paresthesia Endo:  Denies any problems with DM, thyroid, adrenal     Physical Exam: General: Pleasant, well developed female in no acute distress Head: Normocephalic and atraumatic Eyes:  sclerae anicteric, conjunctiva pink  Ears: Normal auditory acuity Lungs: Clear throughout to auscultation Heart: Regular rate and rhythm Abdomen: Soft, non distended, non-tender. No masses, no hepatomegaly. Normal bowel sounds Rectal: Small external hemorrhoid. Small internal hemorrhoid on right, easily reducible. Brown stool heme negative. Musculoskeletal: Symmetrical with no gross deformities  Extremities: No edema  Neurological: Alert oriented x 4, grossly nonfocal Psychological:  Alert and cooperative. Normal mood and affect  Assessment and Recommendations:  58-year-old female with a history of hemorrhoids who recently became constipated and in is again having trouble with her hemorrhoids. She's been instructed to continue her Linzess and to use Metamucil once or twice daily. She will increase her fluid intake. She will use Anusol HC suppositories 1 per rectum twice a day for 7 days, for her external hemorrhoids she will use AnaMantle cream twice daily for 7 days. She will use Tucks wipes. She will follow up as needed.       ,  P PA-C 05/14/2014,  

## 2014-05-14 NOTE — Progress Notes (Signed)
Agree with initial assessment and plans 

## 2014-05-14 NOTE — Patient Instructions (Signed)
We have sent the following medications to your pharmacy for you to pick up at your convenience: Anusol and Anamantle cream  Please purchase the following medications over the counter and take as directed: Tucks wipes  Please follow up as needed  Hemorrhoids Hemorrhoids are swollen veins around the rectum or anus. There are two types of hemorrhoids:   Internal hemorrhoids. These occur in the veins just inside the rectum. They may poke through to the outside and become irritated and painful.  External hemorrhoids. These occur in the veins outside the anus and can be felt as a painful swelling or hard lump near the anus. CAUSES  Pregnancy.   Obesity.   Constipation or diarrhea.   Straining to have a bowel movement.   Sitting for long periods on the toilet.  Heavy lifting or other activity that caused you to strain.  Anal intercourse. SYMPTOMS   Pain.   Anal itching or irritation.   Rectal bleeding.   Fecal leakage.   Anal swelling.   One or more lumps around the anus.  DIAGNOSIS  Your caregiver may be able to diagnose hemorrhoids by visual examination. Other examinations or tests that may be performed include:   Examination of the rectal area with a gloved hand (digital rectal exam).   Examination of anal canal using a small tube (scope).   A blood test if you have lost a significant amount of blood.  A test to look inside the colon (sigmoidoscopy or colonoscopy). TREATMENT Most hemorrhoids can be treated at home. However, if symptoms do not seem to be getting better or if you have a lot of rectal bleeding, your caregiver may perform a procedure to help make the hemorrhoids get smaller or remove them completely. Possible treatments include:   Placing a rubber band at the base of the hemorrhoid to cut off the circulation (rubber band ligation).   Injecting a chemical to shrink the hemorrhoid (sclerotherapy).   Using a tool to burn the hemorrhoid  (infrared light therapy).   Surgically removing the hemorrhoid (hemorrhoidectomy).   Stapling the hemorrhoid to block blood flow to the tissue (hemorrhoid stapling).  HOME CARE INSTRUCTIONS   Eat foods with fiber, such as whole grains, beans, nuts, fruits, and vegetables. Ask your doctor about taking products with added fiber in them (fibersupplements).  Increase fluid intake. Drink enough water and fluids to keep your urine clear or pale yellow.   Exercise regularly.   Go to the bathroom when you have the urge to have a bowel movement. Do not wait.   Avoid straining to have bowel movements.   Keep the anal area dry and clean. Use wet toilet paper or moist towelettes after a bowel movement.   Medicated creams and suppositories may be used or applied as directed.   Only take over-the-counter or prescription medicines as directed by your caregiver.   Take warm sitz baths for 15-20 minutes, 3-4 times a day to ease pain and discomfort.   Place ice packs on the hemorrhoids if they are tender and swollen. Using ice packs between sitz baths may be helpful.   Put ice in a plastic bag.   Place a towel between your skin and the bag.   Leave the ice on for 15-20 minutes, 3-4 times a day.   Do not use a donut-shaped pillow or sit on the toilet for long periods. This increases blood pooling and pain.  SEEK MEDICAL CARE IF:  You have increasing pain and swelling that is  not controlled by treatment or medicine.  You have uncontrolled bleeding.  You have difficulty or you are unable to have a bowel movement.  You have pain or inflammation outside the area of the hemorrhoids. MAKE SURE YOU:  Understand these instructions.  Will watch your condition.  Will get help right away if you are not doing well or get worse. Document Released: 12/19/1999 Document Revised: 12/08/2011 Document Reviewed: 10/26/2011 [facility] Patient Information 2015 [place], [place]. This  information is not intended to replace advice given to you by your health care provider. Make sure you discuss any questions you have with your health care provider.

## 2014-05-15 ENCOUNTER — Telehealth: Payer: Self-pay | Admitting: Physician Assistant

## 2014-05-15 NOTE — Telephone Encounter (Signed)
Patient is having burning with the Anamantle cream. She is asking if she can use Proctofoam instead. She already has some of this. Please, advise.(May leave answer on answering machine)

## 2014-05-15 NOTE — Telephone Encounter (Signed)
Yes that's fine 

## 2014-05-16 NOTE — Telephone Encounter (Signed)
Patient notified of recommendation. 

## 2014-06-17 ENCOUNTER — Encounter: Payer: Self-pay | Admitting: Internal Medicine

## 2014-06-17 NOTE — Telephone Encounter (Signed)
Error

## 2014-06-18 ENCOUNTER — Ambulatory Visit (INDEPENDENT_AMBULATORY_CARE_PROVIDER_SITE_OTHER): Payer: 59 | Admitting: Internal Medicine

## 2014-06-18 ENCOUNTER — Encounter: Payer: Self-pay | Admitting: Internal Medicine

## 2014-06-18 VITALS — BP 110/78 | HR 88 | Ht 60.0 in | Wt 107.0 lb

## 2014-06-18 DIAGNOSIS — R140 Abdominal distension (gaseous): Secondary | ICD-10-CM | POA: Diagnosis not present

## 2014-06-18 DIAGNOSIS — K3184 Gastroparesis: Secondary | ICD-10-CM

## 2014-06-18 DIAGNOSIS — R1084 Generalized abdominal pain: Secondary | ICD-10-CM

## 2014-06-18 DIAGNOSIS — K219 Gastro-esophageal reflux disease without esophagitis: Secondary | ICD-10-CM | POA: Diagnosis not present

## 2014-06-18 DIAGNOSIS — K589 Irritable bowel syndrome without diarrhea: Secondary | ICD-10-CM

## 2014-06-18 NOTE — Patient Instructions (Signed)
Please follow up as needed 

## 2014-06-18 NOTE — Progress Notes (Signed)
HISTORY OF PRESENT ILLNESS:  Ruth Terry is a 59 y.o. female with chronic functional GI complaints, GERD, and hemorrhoids/fissure. Previous patient of Dr. Sharlett Iles. Has been seen in this office on multiple occasions over the past 6 months. Presents today with complaints of abdominal soreness or tenderness below the umbilicus with bilateral radiation to her sides. She states that this has been present for about 10 days and began after [place]. She cannot describe any exacerbating or relieving factors. She reports having several bowel movements, formed, per day. No fever or bleeding. No weight loss. She thought that this problem was new. However, she had a CT scan of the abdomen and pelvis 11/11/2013 for abdominal pain described as tender and sore. Contrast-enhanced CT of the abdomen and pelvis was unremarkable. The patient looks well. She takes probiotic. Linzess as needed  REVIEW OF SYSTEMS:  All non-GI ROS negative upon review  Past Medical History  Diagnosis Date  . MVP (mitral valve prolapse)   . IBS (irritable bowel syndrome)   . Hemorrhoids   . Hyperlipidemia   . GERD (gastroesophageal reflux disease)   . Gastroparesis   . Polyp, stomach 04-20-10    egd  . Diverticulosis 04-20-10    colonoscopy  . UTI (lower urinary tract infection)   . Chronic constipation   . Anal fissure     History reviewed. No pertinent past surgical history.  Social History Ruth Terry  reports that she has quit smoking. Her smoking use included Cigarettes. She has a 3 pack-year smoking history. She has never used smokeless tobacco. She reports that she drinks alcohol. She reports that she does not use illicit drugs.  family history includes Diabetes in her father; Heart attack in her father; Hypertension in her mother.  Allergies  Allergen Reactions  . Chlordiazepoxide-Clidinium   . Ciprofloxacin   . Sulfa Drugs Cross Reactors Nausea Only  . Nitrofurantoin Monohyd Macro  Rash       PHYSICAL EXAMINATION: Vital signs: BP 110/78 mmHg  Pulse 88  Ht 5' (1.524 m)  Wt 107 lb (48.535 kg)  BMI 20.90 kg/m2 General: Well-developed, well-nourished, no acute distress HEENT: Sclerae are anicteric, conjunctiva pink. Oral mucosa intact Lungs: Clear Heart: Regular Abdomen: soft, nontender, nondistended, no obvious ascites, no peritoneal signs, normal bowel sounds. No organomegaly. Extremities: No edema Psychiatric: alert and oriented x3. Cooperative   ASSESSMENT:  #1. Vague abdominal soreness. No worrisome features. Not at all clear to me this is a primary GI disorder. Maybe muscle wall tenderness #2. History of dysmotility and functional abdominal complaints #3. Colonoscopy and upper endoscopy 2012 with Dr. Sharlett Iles. No significant abnormalitie   PLAN:  #1. Reassurance #2. If problem persists take symptomatic therapy trial such as Tylenol or NSAID #3. Return to the care of your PCP. GI follow-up as needed  25 minutes was spent face-to-face with this patient. Greater than half the time was spent counseling her regarding her symptoms, possible causes, and plans

## 2014-08-13 ENCOUNTER — Telehealth: Payer: Self-pay | Admitting: Internal Medicine

## 2014-08-14 NOTE — Telephone Encounter (Signed)
Initiated prior auth on Cover My Meds.  Called patient and told her to keep taking the Metamucil she already takes and that I would call her as soon as I had a response.

## 2014-08-21 NOTE — Telephone Encounter (Signed)
Linzess was run through a 2nd time and accepted.  Patient picked up medication

## 2014-08-29 ENCOUNTER — Telehealth: Payer: Self-pay | Admitting: Internal Medicine

## 2014-08-29 NOTE — Telephone Encounter (Signed)
Pt states she was on some antibiotics and some prednisone for an URI. States that now her IBS-constipation is really bothering her. Discussed with pt that she can double the dose of her linzess for several days and see if this helps. Pt will try this and call back if no improvement.

## 2014-09-04 ENCOUNTER — Telehealth: Payer: Self-pay | Admitting: Internal Medicine

## 2014-09-04 NOTE — Telephone Encounter (Signed)
Pt had called on 08/29/14 and stated she was on some antibiotics and some prednisone for an URI. States that now her IBS-constipation is really bothering her. Discussed with pt that she can double the dose of her linzess for several days and see if this helps. Pt will try this and call back if no improvement.  Pt is calling today stating that doubling the linzess has not helped, she is still having problems with IBS constipation. Please advise.

## 2014-09-04 NOTE — Telephone Encounter (Signed)
Spoke with pt and she knows to try the miralax. Pt also states that she is having a lot of gas. She increased her fiber supplement the other day, encouraged her to decrease to what she was taking previously and that should help with the gas. Pt verbalized  Understanding. Pt states she is just not back to normal. States she usually has 3 bms/day and now she may go one day and not the next. Pt will try the miralax.

## 2014-09-04 NOTE — Telephone Encounter (Signed)
MiraLAX until bowel movement

## 2014-09-06 ENCOUNTER — Telehealth: Payer: Self-pay | Admitting: Internal Medicine

## 2014-09-06 NOTE — Telephone Encounter (Signed)
Pt states she is having problems with her IBS with constipation and lots of gas. Pt taking phazyme for the gas but states she is not having her normal 3 bms a day. She is taking miralax and metamucil. Pt wanted to know what she can do until she is seen. Pt instructed to take more miralax. Pt scheduled to see Lori Hvozdovic, PA-C 09/12/14@1 :45pm. Pt aware of appt.

## 2014-09-12 ENCOUNTER — Encounter: Payer: Self-pay | Admitting: Physician Assistant

## 2014-09-12 ENCOUNTER — Telehealth: Payer: Self-pay | Admitting: Physician Assistant

## 2014-09-12 ENCOUNTER — Ambulatory Visit (INDEPENDENT_AMBULATORY_CARE_PROVIDER_SITE_OTHER): Payer: 59 | Admitting: Physician Assistant

## 2014-09-12 ENCOUNTER — Other Ambulatory Visit (INDEPENDENT_AMBULATORY_CARE_PROVIDER_SITE_OTHER): Payer: 59

## 2014-09-12 VITALS — BP 110/70 | HR 82 | Ht 60.0 in | Wt 106.5 lb

## 2014-09-12 DIAGNOSIS — K589 Irritable bowel syndrome without diarrhea: Secondary | ICD-10-CM | POA: Diagnosis not present

## 2014-09-12 DIAGNOSIS — R1084 Generalized abdominal pain: Secondary | ICD-10-CM | POA: Diagnosis not present

## 2014-09-12 DIAGNOSIS — R140 Abdominal distension (gaseous): Secondary | ICD-10-CM

## 2014-09-12 LAB — COMPREHENSIVE METABOLIC PANEL
ALBUMIN: 4.2 g/dL (ref 3.5–5.2)
ALK PHOS: 73 U/L (ref 39–117)
ALT: 11 U/L (ref 0–35)
AST: 12 U/L (ref 0–37)
BUN: 10 mg/dL (ref 6–23)
CALCIUM: 9.4 mg/dL (ref 8.4–10.5)
CHLORIDE: 104 meq/L (ref 96–112)
CO2: 30 mEq/L (ref 19–32)
CREATININE: 0.84 mg/dL (ref 0.40–1.20)
GFR: 74.22 mL/min (ref >60.00)
Glucose, Bld: 113 mg/dL — ABNORMAL HIGH (ref 70–99)
POTASSIUM: 3.5 meq/L (ref 3.5–5.1)
SODIUM: 142 meq/L (ref 135–145)
TOTAL PROTEIN: 6.8 g/dL (ref 6.0–8.3)
Total Bilirubin: 0.3 mg/dL (ref 0.2–1.2)

## 2014-09-12 LAB — AMYLASE: AMYLASE: 67 U/L (ref 27–131)

## 2014-09-12 LAB — LIPASE: LIPASE: 55 U/L (ref 11.0–59.0)

## 2014-09-12 MED ORDER — LINACLOTIDE 290 MCG PO CAPS: 290.0000 ug | ORAL_CAPSULE | Freq: Every day | ORAL | Status: DC

## 2014-09-12 MED ORDER — RIFAXIMIN 550 MG PO TABS: 550.0000 mg | ORAL_TABLET | Freq: Three times a day (TID) | ORAL | Status: DC

## 2014-09-12 NOTE — Progress Notes (Signed)
Patient ID: Ruth Terry, female   DOB: May 25, 1955, 59 y.o.   MRN: [id number]     History of Present Illness: Ruth Terry is a 59 year old female who is known to Dr. Marina Goodell with a history of IBS, hemorrhoids, gastroparesis, chronic constipation, anal fissures, and GERD. She was last evaluated in June by Dr. At which time she was feeling fairly well. Her last colonoscopy and upper endoscopy were in 2012 with Dr. Jarold Motto and no significant abnormalities were noted. She has been on Linzess 145 g daily. She states about 4 weeks ago she had an upper respiratory infection and was put on a Z-Pak. She developed itching and so her PCP discontinued her Z-Pak and put her on prednisone. She states halfway through the prednisone she became very constipated. She states she called here and was advised to increase her Metamucil. She did this and had no relief and so she says she called back and was advised to use Mira lax. She is now having small nugget-like bowel movements. She tried increasing her Linzess to 290 g, but has not noted a significant difference. He states she feels very bloated and gassy and has missed work due to gas and bloating. She is worried that she has pancreatic cancer. Review of her chart shows that she had a CT of the abdomen and pelvis in November 2015 at which time the pancreas and bile ducts were normal. Her appetite has been good and her weight has been stable. Her main complaint is constipation and gas.   Past Medical History  Diagnosis Date  . MVP (mitral valve prolapse)   . IBS (irritable bowel syndrome)   . Hemorrhoids   . Hyperlipidemia   . GERD (gastroesophageal reflux disease)   . Gastroparesis   . Polyp, stomach 04-20-10    egd  . Diverticulosis 04-20-10    colonoscopy  . UTI (lower urinary tract infection)   . Chronic constipation   . Anal fissure     History reviewed. No pertinent past surgical history. Family History  Problem Relation Age of Onset  .  Hypertension Mother   . Diabetes Father   . Heart attack Father     Age 64   Social History  Substance Use Topics  . Smoking status: Former Smoker -- 0.50 packs/day for 6 years    Types: Cigarettes  . Smokeless tobacco: Never Used     Comment: USE SMOKELESS CIGARETTES  . Alcohol Use: 0.0 oz/week    0 Standard drinks or equivalent per week     Comment: 1 a week   Current Outpatient Prescriptions  Medication Sig Dispense Refill  . alfuzosin (UROXATRAL) 10 MG 24 hr tablet Take 5 mg by mouth 2 (two) times a week.     . AMBULATORY NON FORMULARY MEDICATION Domperidone 10mg  take one at bedtime 100 tablet 3  . bisacodyl (DULCOLAX) 5 MG EC tablet Take 5 mg by mouth daily as needed for moderate constipation.    Marland Kitchen DEXILANT 60 MG capsule TAKE ONE (1) CAPSULE EACH DAY 90 capsule 1  . Meth-Hyo-M Bl-Na Phos-Ph Sal (URIBEL) 118 MG CAPS Take 1 capsule by mouth as needed.    . Probiotic Product (ALIGN) 4 MG CAPS Take 1 capsule by mouth daily.    . psyllium (METAMUCIL) 58.6 % packet Take 1 packet by mouth daily.    . Linaclotide (LINZESS) 290 MCG CAPS capsule Take 1 capsule (290 mcg total) by mouth daily. 30 capsule 2  . rifaximin (XIFAXAN) 550 MG  TABS tablet Take 1 tablet (550 mg total) by mouth 3 (three) times daily. 42 tablet 0   No current facility-administered medications for this visit.   Allergies  Allergen Reactions  . Chlordiazepoxide-Clidinium   . Ciprofloxacin   . Sulfa Drugs Cross Reactors Nausea Only  . Nitrofurantoin Monohyd Macro Rash     Review of Systems: Gen: Denies any fever, chills, sweats, anorexia, fatigue, weakness, malaise, weight loss, and sleep disorder CV: Denies chest pain, angina, palpitations, syncope, orthopnea, PND, peripheral edema, and claudication. Resp: Denies dyspnea at rest, dyspnea with exercise, cough, sputum, wheezing, coughing up blood, and pleurisy. GI: Denies vomiting blood, jaundice, and fecal incontinence.   Denies dysphagia or odynophagia. GU :  Denies urinary burning, blood in urine, urinary frequency, urinary hesitancy, nocturnal urination, and urinary incontinence. MS: Denies joint pain, limitation of movement, and swelling, stiffness, low back pain, extremity pain. Denies muscle weakness, cramps, atrophy.  Derm: Denies rash, itching, dry skin, hives, moles, warts, or unhealing ulcers.  Psych: Denies depression, anxiety, memory loss, suicidal ideation, hallucinations, paranoia, and confusion. Heme: Denies bruising, bleeding, and enlarged lymph nodes. Neuro:  Denies any headaches, dizziness, paresthesia Endo:  Denies any problems with DM, thyroid, adrenal  LAB RESULTS:  BMET  Recent Labs  09/12/14 1459  NA 142  K 3.5  CL 104  CO2 30  GLUCOSE 113*  BUN 10  CREATININE 0.84  CALCIUM 9.4   LFT  Recent Labs  09/12/14 1459  PROT 6.8  ALBUMIN 4.2  AST 12  ALT 11  ALKPHOS 73  BILITOT 0.3    Physical Exam: BP 110/70 mmHg  Pulse 82  Ht 5' (1.524 m)  Wt 106 lb 8 oz (48.308 kg)  BMI 20.80 kg/m2 General: Pleasant, well developed , female in no acute distress Head: Normocephalic and atraumatic Eyes:  sclerae anicteric, conjunctiva pink  Ears: Normal auditory acuity Lungs: Clear throughout to auscultation Heart: Regular rate and rhythm Abdomen: Soft, non distended, non-tender. No masses, no hepatomegaly. Normal bowel sounds Musculoskeletal: Symmetrical with no gross deformities  Extremities: No edema  Neurological: Alert oriented x 4, grossly nonfocal Psychological:  Alert and cooperative. Normal mood and affect  Assessment and Recommendations: 59 year old female with a history of functional abdominal complaints here with worsening constipation and gas. She has been instructed to increase her Linzess to 290 g daily. She will be given a trial of Xifaxan 550 mg 3 times daily for 14 days for possible small intestinal bacterial overgrowth. If her insurance does not cover Xifaxan, we will substitute Flagyl (however the  patient prefers to avoid Flagyl as she likes to have a glass or 2 of wine every night). Patient has been instructed to call us in 2-3 weeks to let us know how she is feeling. If no improvement, we will consider a trial of Amitiza.        Angelene Rome, Tollie Pizza PA-C 09/12/2014,

## 2014-09-12 NOTE — Patient Instructions (Signed)
We have sent the following medications to your pharmacy for you to pick up at your convenience:Linzess and Xifaxan.   Your physician has requested that you go to the basement for the following lab work before leaving today:Cmet,Amylase and Lipase.  Please call us in 2-3 weeks after you have finished the antibiotics and let us know how you are doing.   We will contact you with any appointment date and time to follow with Cecille Rubin Hvozdovic, PA around 6 weeks.

## 2014-09-12 NOTE — Telephone Encounter (Signed)
Sent prescription to [facility] to do PA.

## 2014-09-13 NOTE — Progress Notes (Signed)
Agree with initial assessment and plans as outlined 

## 2014-09-16 ENCOUNTER — Other Ambulatory Visit: Payer: Self-pay | Admitting: Internal Medicine

## 2014-09-16 ENCOUNTER — Telehealth: Payer: Self-pay | Admitting: Physician Assistant

## 2014-09-16 NOTE — Telephone Encounter (Signed)
Rx was sent to [facility] it is in review. Called pt and left vm for pt to call back

## 2014-09-16 NOTE — Telephone Encounter (Signed)
She was offered Flagyl but didn't want Flagyl because she wouldn't be able to drink wine while on Flagyl. If she doesn't want to wait for Xifaxan, we can call in Flagyl but she will not be able to drink any alcohol while she is on the Flagyl.

## 2014-09-16 NOTE — Telephone Encounter (Signed)
Pt is returning your call.  She can be reached at [phone]

## 2014-09-16 NOTE — Telephone Encounter (Signed)
Informed pt that her Rx was sent to [facility] and we are waiting for a response. She wants to know if we do not hear anything in the next few days if there are any other options.

## 2014-09-16 NOTE — Telephone Encounter (Signed)
Pt said her Ins. Said they have not received any info about her Prior Auth for her Xifaxan and she cannot continue waiting for her med.   [phone]

## 2014-09-16 NOTE — Telephone Encounter (Signed)
Called pt and left vm for pt to call back.

## 2014-09-16 NOTE — Telephone Encounter (Signed)
09/16/2014 10:10 AM Phone (Incoming) Ruth Terry, Ruth Terry (Self) [phone] (M)    pt states she is still waiting for the refill for the Xifaxen and also that the Linzess is too strong of a dose; will make her go to bathroom non stop if she takes that. She needs some help with meds.

## 2014-09-17 MED ORDER — DEXLANSOPRAZOLE 60 MG PO CPDR: 60.0000 mg | DELAYED_RELEASE_CAPSULE | Freq: Every day | ORAL | Status: DC

## 2014-09-17 MED ORDER — METRONIDAZOLE 250 MG PO TABS: 250.0000 mg | ORAL_TABLET | Freq: Three times a day (TID) | ORAL | Status: DC

## 2014-09-17 NOTE — Telephone Encounter (Signed)
Pt called back and states that she will  Try the flagyl.

## 2014-09-30 ENCOUNTER — Telehealth: Payer: Self-pay | Admitting: Physician Assistant

## 2014-09-30 MED ORDER — LINACLOTIDE 145 MCG PO CAPS: 145.0000 ug | ORAL_CAPSULE | Freq: Every day | ORAL | Status: DC

## 2014-09-30 NOTE — Telephone Encounter (Signed)
rx sent Patient notified 

## 2014-09-30 NOTE — Telephone Encounter (Signed)
That's fine

## 2014-09-30 NOTE — Telephone Encounter (Signed)
Patient states since she doubled the Linzess to 290 she has had sinus problems and a headache. She has been having good bowel movements. She is wondering if she can go back to Linzess 145 since she did not have any problems with that dose. Please, advise.

## 2014-10-01 ENCOUNTER — Telehealth: Payer: Self-pay | Admitting: Physician Assistant

## 2014-10-03 ENCOUNTER — Telehealth: Payer: Self-pay | Admitting: Physician Assistant

## 2014-10-03 NOTE — Telephone Encounter (Signed)
Patient is having constipation and is having rectal pain. Not sure if it is hemorrhoid or fissure. Scheduled with Lori Hvozdovic, PA-C on  10/09/14 at 2:45 PM.

## 2014-10-09 ENCOUNTER — Other Ambulatory Visit (INDEPENDENT_AMBULATORY_CARE_PROVIDER_SITE_OTHER): Payer: 59

## 2014-10-09 ENCOUNTER — Ambulatory Visit (INDEPENDENT_AMBULATORY_CARE_PROVIDER_SITE_OTHER): Payer: 59 | Admitting: Physician Assistant

## 2014-10-09 ENCOUNTER — Encounter: Payer: Self-pay | Admitting: Physician Assistant

## 2014-10-09 VITALS — BP 116/68 | HR 96 | Ht 60.0 in | Wt 105.4 lb

## 2014-10-09 DIAGNOSIS — L304 Erythema intertrigo: Secondary | ICD-10-CM

## 2014-10-09 DIAGNOSIS — R300 Dysuria: Secondary | ICD-10-CM

## 2014-10-09 LAB — URINALYSIS
BILIRUBIN URINE: NEGATIVE
HGB URINE DIPSTICK: NEGATIVE
Ketones, ur: NEGATIVE
LEUKOCYTES UA: NEGATIVE
Nitrite: NEGATIVE
Specific Gravity, Urine: 1.025 (ref 1.000–1.030)
Total Protein, Urine: NEGATIVE
UROBILINOGEN UA: 0.2 (ref 0.0–1.0)
Urine Glucose: NEGATIVE
pH: 6 (ref 5.0–8.0)

## 2014-10-09 MED ORDER — FLUCONAZOLE 100 MG PO TABS: ORAL_TABLET | ORAL | Status: DC

## 2014-10-09 MED ORDER — CLOTRIMAZOLE 1 % EX CREA: TOPICAL_CREAM | CUTANEOUS | Status: DC

## 2014-10-09 NOTE — Patient Instructions (Addendum)
Please go to the basement level to our lab for a urine test. We sent prescriptions to [facility]. 1. Diflucan 100 mg. 2. Clotrimazole 1 %.   Keep affected area dry.

## 2014-10-09 NOTE — Progress Notes (Signed)
Patient ID: Ruth Terry, female   DOB: December 02, 1955, 59 y.o.   MRN: [id number]     History of Present Illness: Ruth Terry is a 59 year old female known to Dr. Marina Goodell with a history of IBS, hemorrhoids, gastroparesis, chronic constipation, anal fissures, and GERD. She was last seen in the office on September 8 at which time she was complaining of gas and bloating. She had tried increasing her linzess to 290 g but noted it caused her to have headaches. She was given a trial of Flagyl for possible small intestinal bacterial overgrowth and returns today in follow-up. She states her gas and bloating are significantly improved. She is currently using linzess 140 g daily with a daily bowel movement. She will occasionally have to use Mira lax as well, but not on a regular basis. Her main complaint today is a sore area near the rectum. She has had no bright red blood per rectum or melena. She has no pain while passing the stool and no rectal pain after passage of stool she describes a burning in the perirectal area. She states she recently developed a yeast vaginitis and self treated with Monistat OTC for 3 days. She is not convinced that her yeast vaginitis has totally resolved but she now has a burning in the perirectal area. She has had no fever, chills, or night sweats. She has no mucus with her stools and denies tenesmus. She has no abdominal pain. She has some urinary frequency.   Past Medical History  Diagnosis Date  . MVP (mitral valve prolapse)   . IBS (irritable bowel syndrome)   . Hemorrhoids   . Hyperlipidemia   . GERD (gastroesophageal reflux disease)   . Gastroparesis   . Polyp, stomach 04-20-10    egd  . Diverticulosis 04-20-10    colonoscopy  . UTI (lower urinary tract infection)   . Chronic constipation   . Anal fissure     History reviewed. No pertinent past surgical history. Family History  Problem Relation Age of Onset  . Hypertension Mother   . Diabetes Father   . Heart  attack Father     Age 57   Social History  Substance Use Topics  . Smoking status: Former Smoker -- 0.50 packs/day for 6 years    Types: Cigarettes  . Smokeless tobacco: Never Used     Comment: USE SMOKELESS CIGARETTES  . Alcohol Use: 0.0 oz/week    0 Standard drinks or equivalent per week     Comment: 1 a week   Current Outpatient Prescriptions  Medication Sig Dispense Refill  . AMBULATORY NON FORMULARY MEDICATION Domperidone 10mg  take one at bedtime 100 tablet 3  . bisacodyl (DULCOLAX) 5 MG EC tablet Take 5 mg by mouth daily as needed for moderate constipation.    . clotrimazole (LOTRIMIN) 1 % cream Apply a thin layer to the affected area twice daily for 7 days. 30 g 1  . dexlansoprazole (DEXILANT) 60 MG capsule Take 1 capsule (60 mg total) by mouth daily. 90 capsule 3  . fluconazole (DIFLUCAN) 100 MG tablet 1 tablet by mouth for 3 days. 3 tablet 0  . Linaclotide (LINZESS) 145 MCG CAPS capsule Take 1 capsule (145 mcg total) by mouth daily. 30 capsule 2  . Meth-Hyo-M Bl-Na Phos-Ph Sal (URIBEL) 118 MG CAPS Take 1 capsule by mouth as needed.    . Probiotic Product (ALIGN) 4 MG CAPS Take 1 capsule by mouth daily.    . psyllium (METAMUCIL) 58.6 % packet  Take 1 packet by mouth daily.     No current facility-administered medications for this visit.   Allergies  Allergen Reactions  . Chlordiazepoxide-Clidinium   . Ciprofloxacin   . Sulfa Drugs Cross Reactors Nausea Only  . Nitrofurantoin Monohyd Macro Rash     Review of Systems: Gen: Denies any fever, chills, sweats, anorexia, fatigue, weakness, malaise, weight loss, and sleep disorder CV: Denies chest pain, angina, palpitations, syncope, orthopnea, PND, peripheral edema, and claudication. Resp: Denies dyspnea at rest, dyspnea with exercise, cough, sputum, wheezing, coughing up blood, and pleurisy. GI: Denies vomiting blood, jaundice, and fecal incontinence.   Denies dysphagia or odynophagia. GU : Denies urinary burning, blood in  urine, urinary frequency, urinary hesitancy, nocturnal urination, and urinary incontinence. MS: Denies joint pain, limitation of movement, and swelling, stiffness, low back pain, extremity pain. Denies muscle weakness, cramps, atrophy.  Derm: Denies rash, itching, dry skin, hives, moles, warts, or unhealing ulcers.  Psych: Denies depression, anxiety, memory loss, suicidal ideation, hallucinations, paranoia, and confusion. Heme: Denies bruising, bleeding, and enlarged lymph nodes. Neuro:  Denies any headaches, dizziness, paresthesia Endo:  Denies any problems with DM, thyroid, adrenal    Physical Exam: BP 116/68 mmHg  Pulse 96  Ht 5' (1.524 m)  Wt 105 lb 6 oz (47.798 kg)  BMI 20.58 kg/m2 General: Pleasant, well developed , female in no acute distress Head: Normocephalic and atraumatic Eyes:  sclerae anicteric, conjunctiva pink  Ears: Normal auditory acuity Lungs: Clear throughout to auscultation Heart: Regular rate and rhythm Abdomen: Soft, non distended, non-tender. No masses, no hepatomegaly. Normal bowel sounds Rectal: DRE nontender. Brown stool, heme negative, no external hemorrhoids noted at today's visit. There is a 1-1-1/2 inch area of erythema that is well demarcated around the perianal area. There is no associated induration or fluctuance. Musculoskeletal: Symmetrical with no gross deformities  Extremities: No edema  Neurological: Alert oriented x 4, grossly nonfocal Psychological:  Alert and cooperative. Normal mood and affect  Assessment and Recommendations: 59 year old female with a history of functional abdominal complaints. For follow-up. She is currently doing fairly well on linzess 145 g daily, and she will continue this regimen. The perianal area appears to have a candidal rash. She will be given a trial of clotrimazole cream to apply sparingly twice daily for 7-10 days. She's been instructed to keep the area dry as possible. She will also use Diflucan 100 mg 1 by mouth  once daily for 3 days. A urinalysis will also be obtained due to her urinary frequency.        Tyree Fluharty, Tollie Pizza PA-C 10/09/2014,

## 2014-10-10 NOTE — Progress Notes (Signed)
Agree with initial assessment and plans 

## 2014-10-31 NOTE — Telephone Encounter (Signed)
As stated by [facility] - Xifaxan not approved without trying a TCA or SSRI first (they always ask for that).  Would you like me to give her something else in its place?

## 2014-10-31 NOTE — Telephone Encounter (Signed)
NO. Those are not comparable therapies with comparable physiologic effects or side effect profiles

## 2014-11-05 ENCOUNTER — Encounter: Payer: 59 | Admitting: Gastroenterology

## 2014-11-11 ENCOUNTER — Encounter (HOSPITAL_COMMUNITY): Payer: Self-pay | Admitting: Emergency Medicine

## 2014-11-11 ENCOUNTER — Emergency Department (HOSPITAL_COMMUNITY)
Admission: EM | Admit: 2014-11-11 | Discharge: 2014-11-11 | Disposition: A | Discharge status: Home / Self Care | Payer: 59 | Attending: Emergency Medicine | Admitting: Emergency Medicine

## 2014-11-11 ENCOUNTER — Emergency Department (HOSPITAL_COMMUNITY): Payer: 59

## 2014-11-11 DIAGNOSIS — Z8679 Personal history of other diseases of the circulatory system: Secondary | ICD-10-CM | POA: Diagnosis not present

## 2014-11-11 DIAGNOSIS — Z8639 Personal history of other endocrine, nutritional and metabolic disease: Secondary | ICD-10-CM | POA: Diagnosis not present

## 2014-11-11 DIAGNOSIS — Z87891 Personal history of nicotine dependence: Secondary | ICD-10-CM | POA: Diagnosis not present

## 2014-11-11 DIAGNOSIS — K219 Gastro-esophageal reflux disease without esophagitis: Secondary | ICD-10-CM | POA: Diagnosis not present

## 2014-11-11 DIAGNOSIS — R1031 Right lower quadrant pain: Secondary | ICD-10-CM | POA: Insufficient documentation

## 2014-11-11 DIAGNOSIS — Z79899 Other long term (current) drug therapy: Secondary | ICD-10-CM | POA: Insufficient documentation

## 2014-11-11 DIAGNOSIS — R109 Unspecified abdominal pain: Secondary | ICD-10-CM | POA: Diagnosis present

## 2014-11-11 DIAGNOSIS — Z87440 Personal history of urinary (tract) infections: Secondary | ICD-10-CM | POA: Diagnosis not present

## 2014-11-11 LAB — URINALYSIS, ROUTINE W REFLEX MICROSCOPIC
BILIRUBIN URINE: NEGATIVE
Glucose, UA: NEGATIVE mg/dL
Ketones, ur: NEGATIVE mg/dL
LEUKOCYTES UA: NEGATIVE
NITRITE: NEGATIVE
PH: 6 (ref 5.0–8.0)
Protein, ur: NEGATIVE mg/dL
SPECIFIC GRAVITY, URINE: 1.015 (ref 1.005–1.030)
UROBILINOGEN UA: 0.2 mg/dL (ref 0.0–1.0)

## 2014-11-11 LAB — URINE MICROSCOPIC-ADD ON

## 2014-11-11 LAB — WET PREP, GENITAL
Clue Cells Wet Prep HPF POC: NONE SEEN
Trich, Wet Prep: NONE SEEN

## 2014-11-11 IMAGING — CT CT RENAL STONE PROTOCOL
2 of 3 series · 16 of 46 positions shown, 18 images · non-contrast
Comparison: [DATE]

CLINICAL DATA: Acute right flank and groin pain.

EXAM:
CT ABDOMEN AND PELVIS WITHOUT CONTRAST
TECHNIQUE: Multidetector CT imaging of the abdomen and pelvis was performed
following the standard protocol without IV contrast.

[Series 2: standard/full over (age)lbs 5.0 · axial · 0.66mm/px · z∈[-628,-263]mm · 13 of 85 slices shown, 15 images]
[im 6/85  soft-tissue]
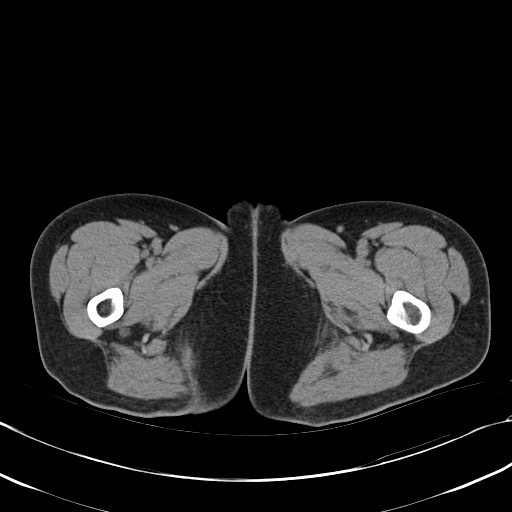
[im 6/85  bone]
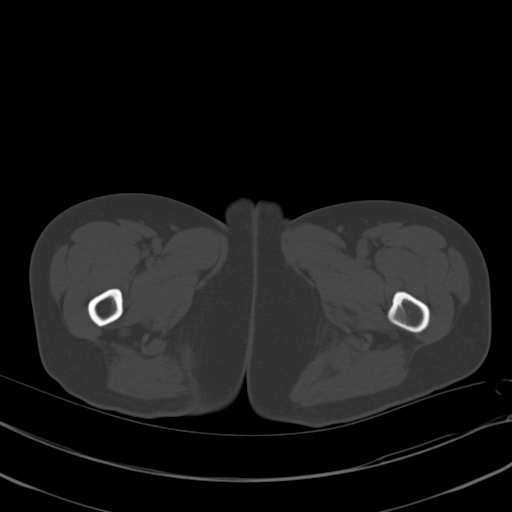
[im 11/85  soft-tissue]
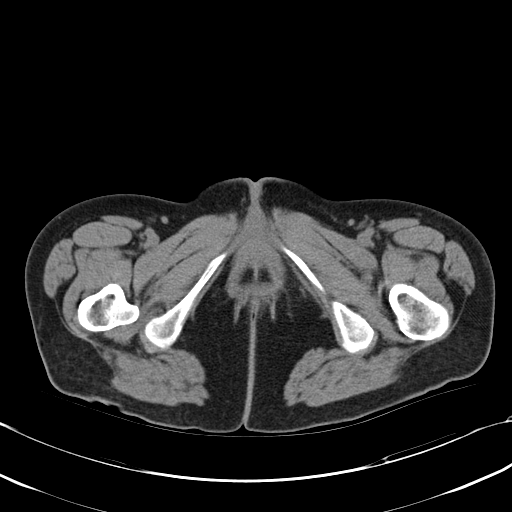
[im 17/85  soft-tissue]
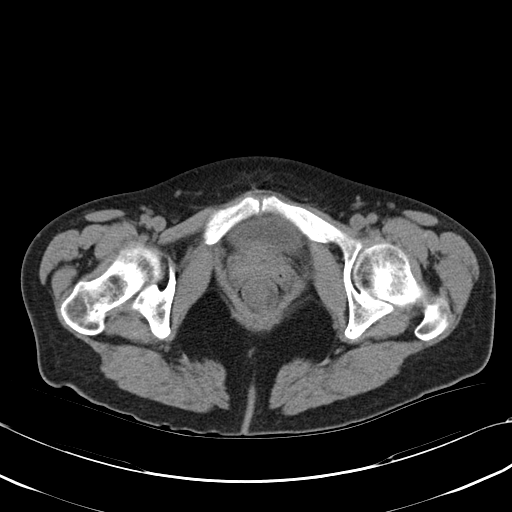
[im 25/85  soft-tissue]
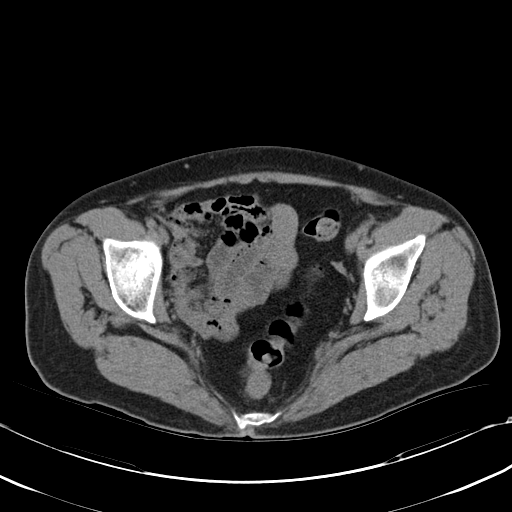
[im 30/85  soft-tissue]
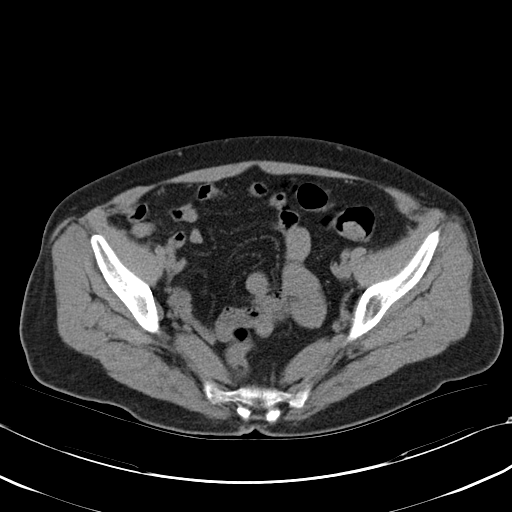
[im 36/85  soft-tissue]
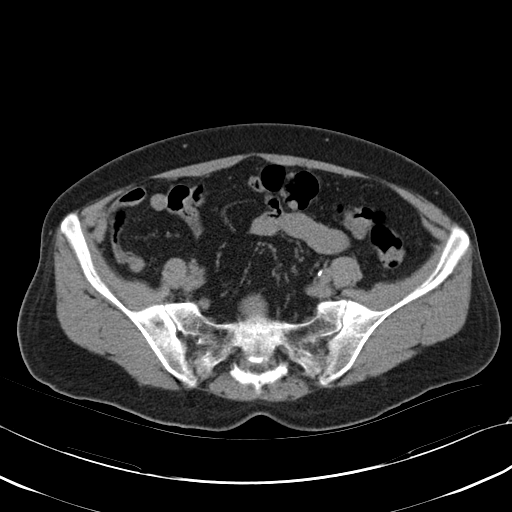
[im 44/85  soft-tissue]
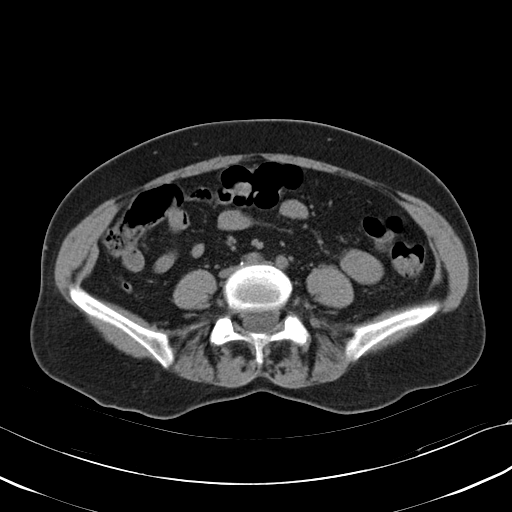
[im 49/85  soft-tissue]
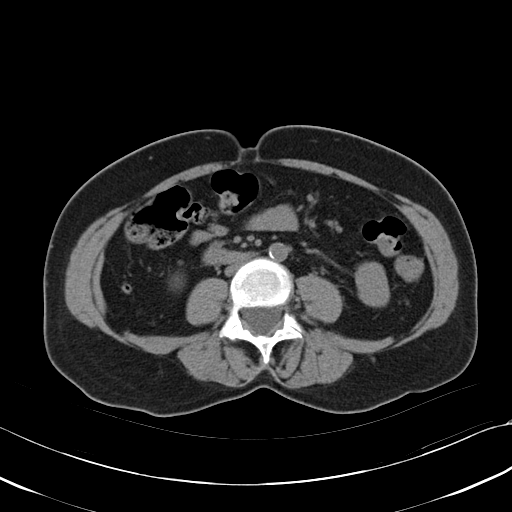
[im 55/85  soft-tissue]
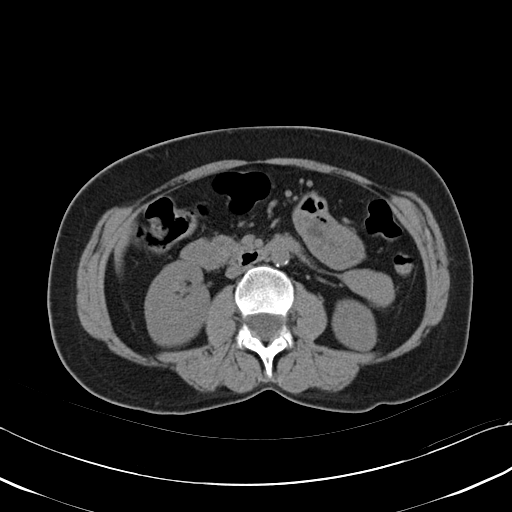
[im 55/85  bone]
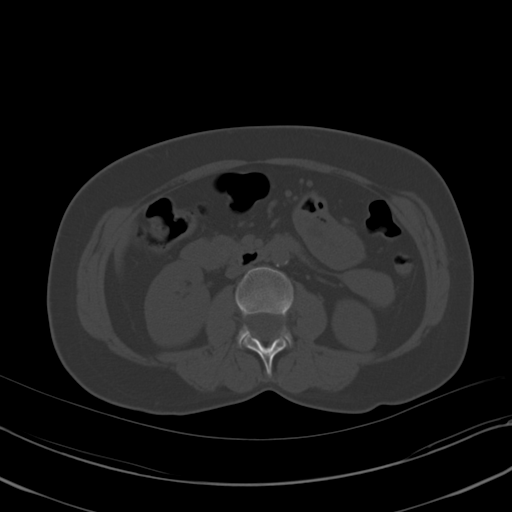
[im 60/85  soft-tissue]
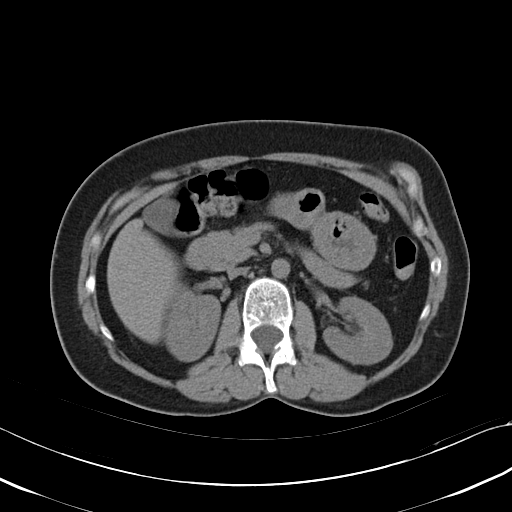
[im 68/85  soft-tissue]
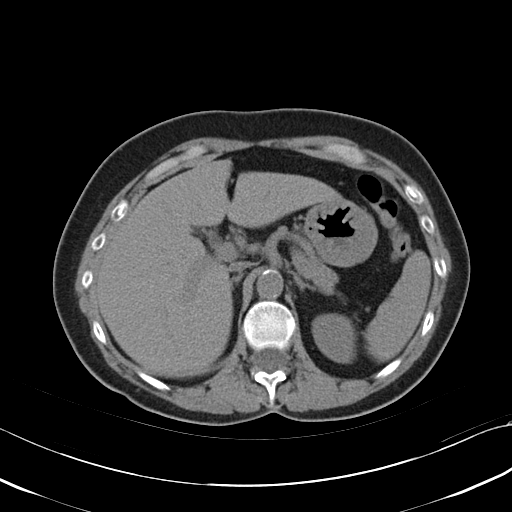
[im 74/85  soft-tissue]
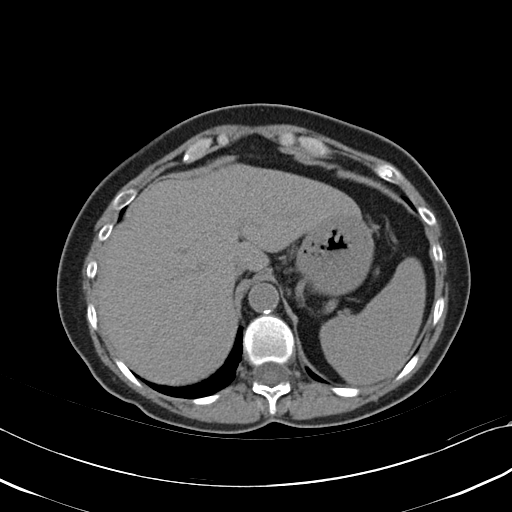
[im 79/85  soft-tissue]
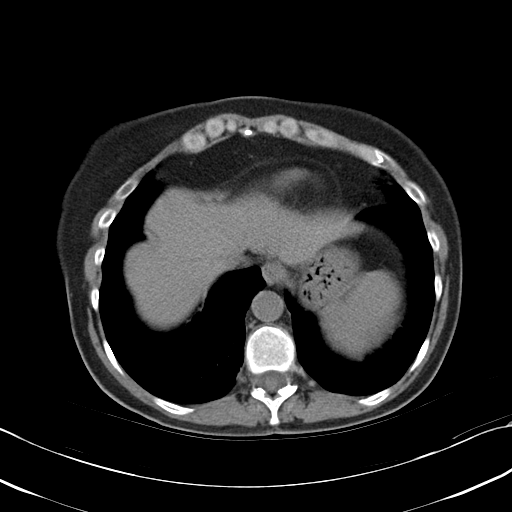

[Series 3: mpr coronal · coronal · 0.64mm/px · 3 of 69 slices shown]
[im 23/69  soft-tissue]
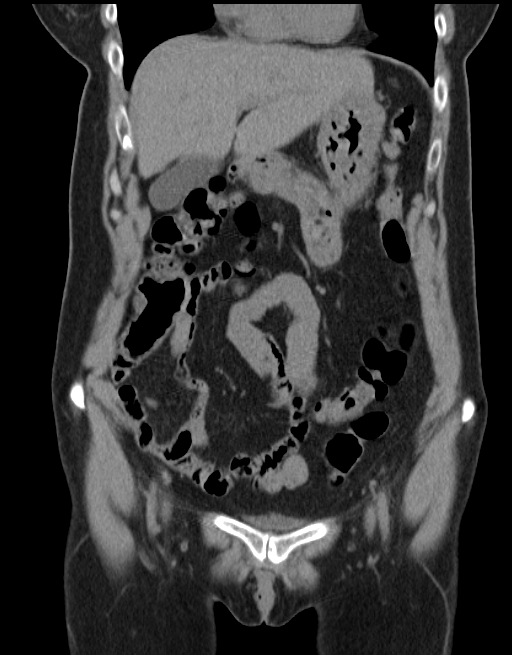
[im 31/69  soft-tissue]
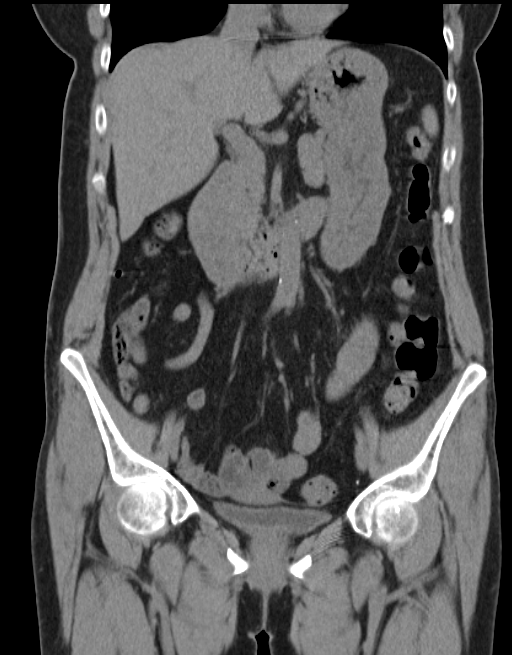
[im 38/69  soft-tissue]
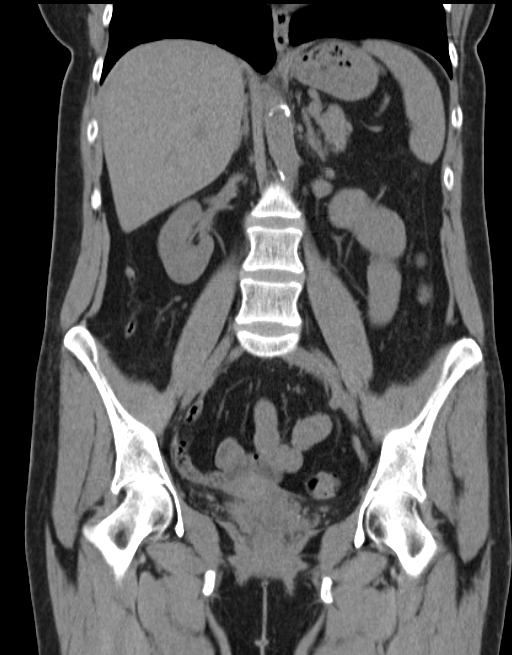

[16 of 46 positions shown; findings below may reference images not displayed]

FINDINGS: Lower chest: Minor bibasilar atelectasis. Normal heart size. No
pericardial or pleural effusion. Minor thoracic atherosclerosis of
the aorta.

Hepatobiliary: No mass visualized on this un-enhanced exam.

Pancreas: No mass or inflammatory process identified on this
un-enhanced exam.

Spleen: Within normal limits in size.

Adrenals/Urinary Tract: No evidence of urolithiasis or
hydronephrosis. No definite mass visualized on this un-enhanced
exam.

Stomach/Bowel: No evidence of obstruction, inflammatory process, or
abnormal fluid collections. Normal appendix.

Vascular/Lymphatic: No pathologically enlarged lymph nodes. No
evidence of abdominal aortic aneurysm. Minor abdominal
atherosclerosis.

Reproductive: No mass or other significant abnormality.

Other: No inguinal abnormality or hernia.

Musculoskeletal:  No acute osseous finding.
IMPRESSION: No acute obstructing urinary tract or ureteral calculus. No
hydronephrosis or obstructive uropathy.

Normal appendix

Abdominal atherosclerosis

No acute intra-abdominal or pelvic finding by noncontrast CT.

## 2014-11-11 MED ORDER — FLUCONAZOLE 150 MG PO TABS: 150.0000 mg | ORAL_TABLET | Freq: Once | ORAL | Status: DC

## 2014-11-11 MED ORDER — IBUPROFEN 800 MG PO TABS: 800.0000 mg | ORAL_TABLET | Freq: Three times a day (TID) | ORAL | Status: DC

## 2014-11-11 NOTE — ED Provider Notes (Signed)
CSN: [id number]     Arrival date & time 11/11/14  1134 History   First MD Initiated Contact with Patient 11/11/14 1427     Chief Complaint  Patient presents with  . Back Pain  . Groin Pain    right     (Consider location/radiation/quality/duration/timing/severity/associated sxs/prior Treatment) HPI Comments: The patient is an otherwise healthy 59 year old female, she does have a history of irritable bowel syndrome, gastroparesis and acid reflux. She has had urinary tract infections and chronic constipation. She reports recently being treated for a stomach infection with Flagyl, she then developed a yeast infection and took Monistat as well as Diflucan, feels as though her symptoms have not quite gone away but now has developed recurrent pain in the right inguinal region with radiation towards the clitoris as well as towards the right flank. She has no nausea or vomiting, no fevers or chills, denies abdominal pain and has no dysuria except for one time yesterday.  Patient is a 59 y.o. female presenting with back pain and groin pain. The history is provided by the patient.  Back Pain Groin Pain    Past Medical History  Diagnosis Date  . MVP (mitral valve prolapse)   . IBS (irritable bowel syndrome)   . Hemorrhoids   . Hyperlipidemia   . GERD (gastroesophageal reflux disease)   . Gastroparesis   . Polyp, stomach 04-20-10    egd  . Diverticulosis 04-20-10    colonoscopy  . UTI (lower urinary tract infection)   . Chronic constipation   . Anal fissure    History reviewed. No pertinent past surgical history. Family History  Problem Relation Age of Onset  . Hypertension Mother   . Diabetes Father   . Heart attack Father     Age 33   Social History  Substance Use Topics  . Smoking status: Former Smoker -- 0.50 packs/day for 6 years    Types: Cigarettes  . Smokeless tobacco: Never Used     Comment: USE SMOKELESS CIGARETTES  . Alcohol Use: 0.0 oz/week    0 Standard drinks or  equivalent per week     Comment: 1 a week   OB History    No data available     Review of Systems  Musculoskeletal: Positive for back pain.  All other systems reviewed and are negative.     Allergies  Chlordiazepoxide-clidinium; Ciprofloxacin; Sulfa drugs cross reactors; and Nitrofurantoin monohyd macro  Home Medications   Prior to Admission medications   Medication Sig Start Date End Date Taking? Authorizing Provider  AMBULATORY NON FORMULARY MEDICATION Domperidone 10mg  take one at bedtime 10/29/13  Yes Irene Shipper, MD  bisacodyl (DULCOLAX) 5 MG EC tablet Take 5 mg by mouth daily as needed for moderate constipation.   Yes Historical Provider, MD  Cholecalciferol (VITAMIN D3) 5000 UNITS CAPS Take 1 capsule by mouth daily.   Yes Historical Provider, MD  clotrimazole (LOTRIMIN) 1 % cream Apply a thin layer to the affected area twice daily for 7 days. 10/09/14  Yes Lori P Hvozdovic, PA-C  dexlansoprazole (DEXILANT) 60 MG capsule Take 1 capsule (60 mg total) by mouth daily. 09/17/14  Yes Lori P Hvozdovic, PA-C  Linaclotide (LINZESS) 145 MCG CAPS capsule Take 1 capsule (145 mcg total) by mouth daily. 09/30/14  Yes Lori P Hvozdovic, PA-C  Meth-Hyo-M Bl-Na Phos-Ph Sal (URIBEL) 118 MG CAPS Take 1 capsule by mouth as needed.   Yes Historical Provider, MD  Probiotic Product (ALIGN) 4 MG CAPS Take 1  capsule by mouth daily.   Yes Historical Provider, MD  psyllium (METAMUCIL) 58.6 % packet Take 1 packet by mouth daily.   Yes Historical Provider, MD  fluconazole (DIFLUCAN) 150 MG tablet Take 1 tablet (150 mg total) by mouth once. 11/11/14   Noemi Chapel, MD  ibuprofen (ADVIL,MOTRIN) 800 MG tablet Take 1 tablet (800 mg total) by mouth 3 (three) times daily. 11/11/14   Noemi Chapel, MD   BP 148/68 mmHg  Pulse 96  Temp(Src) 98 F (36.7 C) (Oral)  Resp 12  Ht 5' (1.524 m)  Wt 105 lb (47.628 kg)  BMI 20.51 kg/m2  SpO2 100% Physical Exam  Constitutional: She appears well-developed and  well-nourished. No distress.  HENT:  Head: Normocephalic and atraumatic.  Mouth/Throat: Oropharynx is clear and moist. No oropharyngeal exudate.  Eyes: Conjunctivae and EOM are normal. Pupils are equal, round, and reactive to light. Right eye exhibits no discharge. Left eye exhibits no discharge. No scleral icterus.  Neck: Normal range of motion. Neck supple. No JVD present. No thyromegaly present.  Cardiovascular: Normal rate, regular rhythm, normal heart sounds and intact distal pulses.  Exam reveals no gallop and no friction rub.   No murmur heard. Pulmonary/Chest: Effort normal and breath sounds normal. No respiratory distress. She has no wheezes. She has no rales.  Abdominal: Soft. Bowel sounds are normal. She exhibits no distension and no mass. There is no tenderness.  Totally nontender abdomen, no masses, no pain in McBurney's point. No CVA tenderness  Genitourinary:  No inguinal masses, no labial masses, no hernia present, no lymphadenopathy of the inguinal region  Musculoskeletal: Normal range of motion. She exhibits no edema or tenderness.  Lymphadenopathy:    She has no cervical adenopathy.  Neurological: She is alert. Coordination normal.  Skin: Skin is warm and dry. No rash noted. No erythema.  Psychiatric: She has a normal mood and affect. Her behavior is normal.  Nursing note and vitals reviewed.   ED Course  Procedures (including critical care time) Labs Review Labs Reviewed  WET PREP, GENITAL - Abnormal; Notable for the following:    Yeast Wet Prep HPF POC FEW (*)    WBC, Wet Prep HPF POC FEW (*)    All other components within normal limits  URINALYSIS, ROUTINE W REFLEX MICROSCOPIC (NOT AT [facility]) - Abnormal; Notable for the following:    Hgb urine dipstick TRACE (*)    All other components within normal limits  URINE MICROSCOPIC-ADD ON  GC/CHLAMYDIA PROBE AMP ([place]) NOT AT [facility]    Imaging Review Ct Renal Stone Study  11/11/2014  CLINICAL DATA:  Acute  right flank and groin pain. EXAM: CT ABDOMEN AND PELVIS WITHOUT CONTRAST TECHNIQUE: Multidetector CT imaging of the abdomen and pelvis was performed following the standard protocol without IV contrast. COMPARISON:  11/11/2013 FINDINGS: Lower chest: Minor bibasilar atelectasis. Normal heart size. No pericardial or pleural effusion. Minor thoracic atherosclerosis of the aorta. Hepatobiliary: No mass visualized on this un-enhanced exam. Pancreas: No mass or inflammatory process identified on this un-enhanced exam. Spleen: Within normal limits in size. Adrenals/Urinary Tract: No evidence of urolithiasis or hydronephrosis. No definite mass visualized on this un-enhanced exam. Stomach/Bowel: No evidence of obstruction, inflammatory process, or abnormal fluid collections. Normal appendix. Vascular/Lymphatic: No pathologically enlarged lymph nodes. No evidence of abdominal aortic aneurysm. Minor abdominal atherosclerosis. Reproductive: No mass or other significant abnormality. Other: No inguinal abnormality or hernia. Musculoskeletal:  No acute osseous finding. IMPRESSION: No acute obstructing urinary tract or ureteral calculus.  No hydronephrosis or obstructive uropathy. Normal appendix Abdominal atherosclerosis No acute intra-abdominal or pelvic finding by noncontrast CT. Electronically Signed   By: Jerilynn Mages.  Shick M.D.   On: 11/11/2014 18:52   I have personally reviewed and evaluated these images and lab results as part of my medical decision-making.    MDM   Final diagnoses:  Right flank pain  Inguinal pain, right    The patient has a benign exam, check her urinalysis, may need a pelvic exam for yeast infection and if no source is on urinary tract. Abdomen appears benign, no hernias present  Labs and CT scan wtihout acute findings - VS normal - UA n eg, occasional yeast - pt informed, pt appears stable for d/c - - informed of need for GYN f/u.  Meds given in ED:  Medications - No data to display  New  Prescriptions   FLUCONAZOLE (DIFLUCAN) 150 MG TABLET    Take 1 tablet (150 mg total) by mouth once.   IBUPROFEN (ADVIL,MOTRIN) 800 MG TABLET    Take 1 tablet (800 mg total) by mouth 3 (three) times daily.      Noemi Chapel, MD 11/11/14 [phone]

## 2014-11-11 NOTE — Discharge Instructions (Signed)

## 2014-11-11 NOTE — ED Notes (Signed)
Having pain to right groin, radiating to back.  Rates pain 8/10.  Was treated for yeast infection.

## 2014-11-12 LAB — GC/CHLAMYDIA PROBE AMP (~~LOC~~) NOT AT ARMC
CHLAMYDIA, DNA PROBE: NEGATIVE
NEISSERIA GONORRHEA: NEGATIVE

## 2014-11-13 ENCOUNTER — Ambulatory Visit: Payer: 59 | Admitting: Physician Assistant

## 2014-11-25 ENCOUNTER — Other Ambulatory Visit: Payer: Self-pay | Admitting: Physician Assistant

## 2014-12-02 ENCOUNTER — Telehealth: Payer: Self-pay | Admitting: Internal Medicine

## 2014-12-02 NOTE — Telephone Encounter (Signed)
Sent message to Dr. Henrene Pastor to make sure ok to refill patient's Domperidone.

## 2014-12-03 ENCOUNTER — Telehealth: Payer: Self-pay

## 2014-12-03 MED ORDER — AMBULATORY NON FORMULARY MEDICATION: Status: DC

## 2014-12-03 NOTE — Telephone Encounter (Signed)
Domperidone faxed

## 2014-12-03 NOTE — Telephone Encounter (Signed)
Lm that I had faxed Domperidone rx to [place] [facility] online.  She is to call if there are any questions or problems

## 2014-12-03 NOTE — Telephone Encounter (Signed)
-----   Message from Irene Shipper, MD sent at 12/02/2014  4:19 PM EST ----- OK to refill ----- Message -----    From: Audrea Muscat, CMA    Sent: 12/02/2014   4:07 PM      To: Irene Shipper, MD  Patient, who saw Cecille Rubin on 10/09/2014 and seeing her again 12/04/2014 has run out of domperidone.  Last filled a year ago and has just expired.  Just wanted to make sure it was ok to refill.  Please advise.  Thanks

## 2014-12-04 ENCOUNTER — Ambulatory Visit: Payer: 59 | Admitting: Physician Assistant

## 2014-12-10 ENCOUNTER — Telehealth: Payer: Self-pay | Admitting: Physician Assistant

## 2014-12-11 MED ORDER — AMBULATORY NON FORMULARY MEDICATION: Status: DC

## 2014-12-11 NOTE — Telephone Encounter (Signed)
Patient notified of recommendations. Rx called to [facility].

## 2014-12-11 NOTE — Telephone Encounter (Signed)
Patient states her hemorrhoid has flared. She has chronic constipation and has strained. Thinks she may have a tear. She is using Proctofoam HC without relief.  States it hurts when she moves. No bleeding. She has an OV scheduled on 12/25/14 but is asking if there is anything else she can use before OV.

## 2014-12-11 NOTE — Telephone Encounter (Signed)
She can use a stool softener like Colace so that her stools are hard. She can try Mira lax if she doesn't like Colace. Does she have pain anytime she moves, or anytime she moves her bowels. If it's when she moves, likely musculoskeletal and she said see her PCP. If it's when she moves her bowels, she can try diltiazem ointment 2% to the affected area twice daily for 4 weeks. She can also try recticare cream to the affected area as needed.

## 2014-12-24 ENCOUNTER — Other Ambulatory Visit: Payer: Self-pay | Admitting: Physician Assistant

## 2014-12-25 ENCOUNTER — Other Ambulatory Visit (INDEPENDENT_AMBULATORY_CARE_PROVIDER_SITE_OTHER): Payer: 59

## 2014-12-25 ENCOUNTER — Encounter: Payer: Self-pay | Admitting: Physician Assistant

## 2014-12-25 ENCOUNTER — Ambulatory Visit (INDEPENDENT_AMBULATORY_CARE_PROVIDER_SITE_OTHER): Payer: 59 | Admitting: Physician Assistant

## 2014-12-25 VITALS — BP 94/60 | HR 100 | Ht 60.0 in | Wt 108.0 lb

## 2014-12-25 DIAGNOSIS — K589 Irritable bowel syndrome without diarrhea: Secondary | ICD-10-CM

## 2014-12-25 DIAGNOSIS — K219 Gastro-esophageal reflux disease without esophagitis: Secondary | ICD-10-CM

## 2014-12-25 DIAGNOSIS — R1011 Right upper quadrant pain: Secondary | ICD-10-CM

## 2014-12-25 DIAGNOSIS — L290 Pruritus ani: Secondary | ICD-10-CM | POA: Diagnosis not present

## 2014-12-25 LAB — CBC WITH DIFFERENTIAL/PLATELET
Basophils Absolute: 0 10*3/uL (ref 0.0–0.1)
Basophils Relative: 0.7 % (ref 0.0–3.0)
EOS PCT: 1.8 % (ref 0.0–5.0)
Eosinophils Absolute: 0.1 10*3/uL (ref 0.0–0.7)
HCT: 41.7 % (ref 36.0–46.0)
Hemoglobin: 14.2 g/dL (ref 12.0–15.0)
LYMPHS ABS: 1.4 10*3/uL (ref 0.7–4.0)
Lymphocytes Relative: 30.9 % (ref 12.0–46.0)
MCHC: 34.1 g/dL (ref 30.0–36.0)
MCV: 90 fl (ref 78.0–100.0)
MONO ABS: 0.4 10*3/uL (ref 0.1–1.0)
Monocytes Relative: 8.3 % (ref 3.0–12.0)
NEUTROS PCT: 58.3 % (ref 43.0–77.0)
Neutro Abs: 2.6 10*3/uL (ref 1.4–7.7)
PLATELETS: 219 10*3/uL (ref 150.0–400.0)
RBC: 4.63 Mil/uL (ref 3.87–5.11)
RDW: 13.8 % (ref 11.5–15.5)
WBC: 4.5 10*3/uL (ref 4.0–10.5)

## 2014-12-25 LAB — HEPATIC FUNCTION PANEL
ALBUMIN: 4.3 g/dL (ref 3.5–5.2)
ALK PHOS: 75 U/L (ref 39–117)
ALT: 15 U/L (ref 0–35)
AST: 15 U/L (ref 0–37)
Bilirubin, Direct: 0.1 mg/dL (ref 0.0–0.3)
Total Bilirubin: 0.3 mg/dL (ref 0.2–1.2)
Total Protein: 6.9 g/dL (ref 6.0–8.3)

## 2014-12-25 LAB — AMYLASE: AMYLASE: 63 U/L (ref 27–131)

## 2014-12-25 LAB — LIPASE: LIPASE: 69 U/L — AB (ref 11.0–59.0)

## 2014-12-25 NOTE — Patient Instructions (Signed)
You have been scheduled for an abdominal ultrasound at [facility]  on 12-27-2014 at 8:00am. Please arrive 15 minutes prior to your appointment for registration. Make certain not to have anything to eat or drink 6 hours prior to your appointment. Should you need to reschedule your appointment, please contact radiology at [phone]. This test typically takes about 30 minutes to perform.   Please continue your medications Dexilant and Domperidone.  Use over the counter triple paste cream, apply to affected area as needed.  Your physician has requested that you go to the basement for lab work before leaving today.  Please follow up with Dr. Henrene Pastor in 2 months. 02-12-2015 @ 1:30pm

## 2014-12-25 NOTE — Progress Notes (Signed)
Patient ID: Ruth Terry, female   DOB: 1955/06/20, 59 y.o.   MRN: [id number]     History of Present Illness: Ruth Terry is a pleasant 59 year old female known to Dr. Marina Goodell with a history of IBS, gastroparesis, GERD, hemorrhoids, chronic constipation, and anal fissures. She was last seen in the office in early October. At that time she was complaining of some perirectal discomfort. She had had no bleeding. She had recently completed treatment for a yeast vaginitis. She was noted to have what appeared to be a candidal rash in the perianal area and was treated with clotrimazole cream and Diflucan with resolution. She states she subsequently developed another yeast vaginitis and had to be treated for that elsewhere. Since then she ran out of her domperidone and went for a two-week stretch with no domperidone. This resulted in an exacerbation of her GERD symptoms. She recently restarted her domperidone several days ago but is continuing to have some breakthrough heartburn. She also has been having some postprandial right upper quadrant discomfort that radiates to the scapula with waves of nausea. She has not vomited. She complains of excessive gas.. She had been treated in the past with a course of Flagyl which significantly reduced her gas however she feels it caused her vaginal yeast infection. We have discussed another trial of Flagyl to decrease her gas however she states we are in the holidays and she has many social functions to attend and she wants to be able to have several glasses of wine. Therefore she declines another trial of Flagyl. She states she has had no rectal bleeding and she is moving her bowels fairly regularly but she has some perirectal irritation.   Past Medical History  Diagnosis Date  . MVP (mitral valve prolapse)   . IBS (irritable bowel syndrome)   . Hemorrhoids   . Hyperlipidemia   . GERD (gastroesophageal reflux disease)   . Gastroparesis   . Polyp, stomach 04-20-10   egd  . Diverticulosis 04-20-10    colonoscopy  . UTI (lower urinary tract infection)   . Chronic constipation   . Anal fissure     History reviewed. No pertinent past surgical history. Family History  Problem Relation Age of Onset  . Hypertension Mother   . Diabetes Father   . Heart attack Father     Age 76   Social History  Substance Use Topics  . Smoking status: Former Smoker -- 0.50 packs/day for 6 years    Types: Cigarettes  . Smokeless tobacco: Never Used     Comment: USE SMOKELESS CIGARETTES  . Alcohol Use: 0.0 oz/week    0 Standard drinks or equivalent per week     Comment: 1 a week   Current Outpatient Prescriptions  Medication Sig Dispense Refill  . AMBULATORY NON FORMULARY MEDICATION Domperidone 10mg  take one at bedtime 100 tablet 3  . AMBULATORY NON FORMULARY MEDICATION Domperidone 10mg  take one at bedtime 100 tablet 3  . AMBULATORY NON FORMULARY MEDICATION Medication Name: diltiazem ointment 2% use BID x 4 weeks 1 Tube 0  . bisacodyl (DULCOLAX) 5 MG EC tablet Take 5 mg by mouth daily as needed for moderate constipation.    . Cholecalciferol (VITAMIN D3) 5000 UNITS CAPS Take 1 capsule by mouth daily.    . clotrimazole (LOTRIMIN) 1 % cream Apply a thin layer to the affected area twice daily for 7 days. 30 g 1  . dexlansoprazole (DEXILANT) 60 MG capsule Take 1 capsule (60 mg total) by  mouth daily. 90 capsule 3  . ibuprofen (ADVIL,MOTRIN) 800 MG tablet Take 1 tablet (800 mg total) by mouth 3 (three) times daily. 21 tablet 0  . LINZESS 145 MCG CAPS capsule TAKE ONE (1) CAPSULE EACH DAY 30 capsule 2  . Meth-Hyo-M Bl-Na Phos-Ph Sal (URIBEL) 118 MG CAPS Take 1 capsule by mouth as needed.    . Probiotic Product (ALIGN) 4 MG CAPS Take 1 capsule by mouth daily.    . psyllium (METAMUCIL) 58.6 % packet Take 1 packet by mouth daily.     No current facility-administered medications for this visit.   Allergies  Allergen Reactions  . Chlordiazepoxide-Clidinium   .  Ciprofloxacin   . Sulfa Drugs Cross Reactors Nausea Only  . Nitrofurantoin Monohyd Macro Rash     Review of Systems: Gen: Denies any fever, chills, sweats, anorexia, fatigue, weakness, malaise, weight loss, and sleep disorder CV: Denies chest pain, angina, palpitations, syncope, orthopnea, PND, peripheral edema, and claudication. Resp: Denies dyspnea at rest, dyspnea with exercise, cough, sputum, wheezing, coughing up blood, and pleurisy. GI: Denies vomiting blood, jaundice, and fecal incontinence.   Denies dysphagia or odynophagia. GU : Denies urinary burning, blood in urine, urinary frequency, urinary hesitancy, nocturnal urination, and urinary incontinence. MS: Denies joint pain, limitation of movement, and swelling, stiffness, low back pain, extremity pain. Denies muscle weakness, cramps, atrophy.  Derm: Denies rash, itching, dry skin, hives, moles, warts, or unhealing ulcers.  Psych: Denies depression, anxiety, memory loss, suicidal ideation, hallucinations, paranoia, and confusion. Heme: Denies bruising, bleeding, and enlarged lymph nodes. Neuro:  Denies any headaches, dizziness, paresthesia Endo:  Denies any problems with DM, thyroid, adrenal    Physical Exam: BP 94/60 mmHg  Pulse 100  Ht 5' (1.524 m)  Wt 108 lb (48.988 kg)  BMI 21.09 kg/m2 General: Pleasant, well developed , Caucasian female in no acute distress Head: Normocephalic and atraumatic Eyes:  sclerae anicteric, conjunctiva pink  Ears: Normal auditory acuity Lungs: Clear throughout to auscultation Heart: Regular rate and rhythm Abdomen: Soft, non distended, non-tender. No masses, no hepatomegaly. Normal bowel sounds Rectal: No external hemorrhoids noted there is a small amount of perianal erythema but no tender rest, induration, or fluctuance. Musculoskeletal: Symmetrical with no gross deformities  Extremities: No edema  Neurological: Alert oriented x 4, grossly nonfocal Psychological:  Alert and cooperative.  Normal mood and affect  Assessment and Recommendations:  #1 GERD. An antireflux regimen has been reviewed and she has been instructed to continue her Dexilant 60 mg by mouth every morning. #2 gastroparesis. She's been reminded to adhere to small-volume, low fat, frequent feedings. She will continue her domperidone. #3. IBS she will continue a high-fiber low-fat diet.  #4. Perianal irritation. She's been instructed to keep the area clean and she will use triple paste to the affected area once or twice daily as a barrier. #5. Right upper quadrant pain. An abdominal ultrasound will be obtained to evaluate for cholelithiasis. A CBC, hepatic function panel, and lipase will be obtained as well. She will follow up in 2-3 months.       Tuwanna Krausz, Moise Boring 12/25/2014,

## 2014-12-26 ENCOUNTER — Other Ambulatory Visit: Payer: Self-pay | Admitting: *Deleted

## 2014-12-26 DIAGNOSIS — R748 Abnormal levels of other serum enzymes: Secondary | ICD-10-CM

## 2014-12-27 ENCOUNTER — Ambulatory Visit (HOSPITAL_COMMUNITY)
Admission: RE | Admit: 2014-12-27 | Discharge: 2014-12-27 | Disposition: A | Discharge status: Home / Self Care | Payer: 59 | Source: Ambulatory Visit | Attending: Physician Assistant | Admitting: Physician Assistant

## 2014-12-27 DIAGNOSIS — K589 Irritable bowel syndrome without diarrhea: Secondary | ICD-10-CM | POA: Diagnosis not present

## 2014-12-27 DIAGNOSIS — R1011 Right upper quadrant pain: Secondary | ICD-10-CM

## 2014-12-27 DIAGNOSIS — K219 Gastro-esophageal reflux disease without esophagitis: Secondary | ICD-10-CM | POA: Diagnosis not present

## 2014-12-27 NOTE — Progress Notes (Signed)
Agree with assessment and plans. Labs ok. Mild elevation of lipase likely not clinically relevant. [place] negative

## 2015-01-08 ENCOUNTER — Telehealth: Payer: Self-pay | Admitting: Physician Assistant

## 2015-01-08 NOTE — Telephone Encounter (Signed)
Advised Ruth Terry the order is in the computer. Will be coming by tomorrow.

## 2015-01-10 ENCOUNTER — Other Ambulatory Visit (INDEPENDENT_AMBULATORY_CARE_PROVIDER_SITE_OTHER): Payer: Self-pay

## 2015-01-10 ENCOUNTER — Telehealth: Payer: Self-pay | Admitting: Physician Assistant

## 2015-01-10 DIAGNOSIS — R748 Abnormal levels of other serum enzymes: Secondary | ICD-10-CM

## 2015-01-10 LAB — HEPATIC FUNCTION PANEL
ALT: 12 U/L (ref 0–35)
AST: 13 U/L (ref 0–37)
Albumin: 4.3 g/dL (ref 3.5–5.2)
Alkaline Phosphatase: 74 U/L (ref 39–117)
BILIRUBIN DIRECT: 0 mg/dL (ref 0.0–0.3)
TOTAL PROTEIN: 6.7 g/dL (ref 6.0–8.3)
Total Bilirubin: 0.3 mg/dL (ref 0.2–1.2)

## 2015-01-10 LAB — LIPASE: LIPASE: 46 U/L (ref 11.0–59.0)

## 2015-01-10 NOTE — Telephone Encounter (Signed)
Spoke with patient and she is coming for labs today. She is calling because she has had foot problems and was wondering if she could have an A1C drawn. Explained that [name], PA-C is not available today to order and that her PCM would be the better one to do this test.

## 2015-01-15 ENCOUNTER — Telehealth: Payer: Self-pay | Admitting: Physician Assistant

## 2015-01-15 NOTE — Telephone Encounter (Signed)
Can she try levsin SL 0.125 mg 1 SL q 8 hrs prn?

## 2015-01-15 NOTE — Telephone Encounter (Signed)
Spoke with patient and she states she is still having the pain at her belly button that radiates to her lower back. States it comes and goes. Today it is bad. She denies constipation. She is taking her Dexilant. States her reflux is better but this pain continues. Please, advise. (has scheduled OV on 02/12/15 with Dr. Henrene Pastor)

## 2015-01-16 MED ORDER — DICYCLOMINE HCL 10 MG PO CAPS: ORAL_CAPSULE | ORAL | Status: DC

## 2015-01-16 NOTE — Telephone Encounter (Signed)
Rx sent to pharmacy. Left a message for patient that rx has been sent.

## 2015-01-16 NOTE — Telephone Encounter (Signed)
Patient is allergic to Chloridazepoxide. Getting a warning when ordering Levsin. Please, advise.

## 2015-01-16 NOTE — Telephone Encounter (Signed)
Can try bentyl 10 mg tid prn 

## 2015-01-24 ENCOUNTER — Telehealth: Payer: Self-pay | Admitting: Physician Assistant

## 2015-01-24 NOTE — Telephone Encounter (Signed)
LM for the patient to fax her new insurance cards to me at [phone]. I advised I will do the prior authorization for the [place].

## 2015-01-27 ENCOUNTER — Other Ambulatory Visit: Payer: Self-pay | Admitting: *Deleted

## 2015-02-05 NOTE — Telephone Encounter (Signed)
Advised patient that I had to redo the Cover my Meds today.  I advised we should know something by Monday. She thanked me and said she is taking a PPI from the pharmacy .  I told her I will call her once I know if the [employer] covers the dexilant.

## 2015-02-10 ENCOUNTER — Telehealth: Payer: Self-pay

## 2015-02-10 NOTE — Telephone Encounter (Signed)
LM for the patient advising we got response from [facility] stating the prior authorization was approved for the dexilant 60 mg.  I LM advising that I do not know what her co-pay would be.  I advised I will call her pharmacy .

## 2015-02-10 NOTE — Telephone Encounter (Signed)
Initiated a prior authorization for patient's Dexilant

## 2015-02-12 ENCOUNTER — Ambulatory Visit (INDEPENDENT_AMBULATORY_CARE_PROVIDER_SITE_OTHER): Payer: BLUE CROSS/BLUE SHIELD | Admitting: Internal Medicine

## 2015-02-12 ENCOUNTER — Encounter: Payer: Self-pay | Admitting: Internal Medicine

## 2015-02-12 VITALS — BP 114/68 | HR 96 | Ht 60.0 in | Wt 108.2 lb

## 2015-02-12 DIAGNOSIS — R140 Abdominal distension (gaseous): Secondary | ICD-10-CM | POA: Diagnosis not present

## 2015-02-12 DIAGNOSIS — K589 Irritable bowel syndrome without diarrhea: Secondary | ICD-10-CM

## 2015-02-12 DIAGNOSIS — K3184 Gastroparesis: Secondary | ICD-10-CM | POA: Diagnosis not present

## 2015-02-12 DIAGNOSIS — K219 Gastro-esophageal reflux disease without esophagitis: Secondary | ICD-10-CM

## 2015-02-12 NOTE — Patient Instructions (Signed)
Please follow up as needed 

## 2015-02-12 NOTE — Progress Notes (Signed)
HISTORY OF PRESENT ILLNESS:  Ruth Terry is a 60 y.o. female for follow-up. She continues with her same chronic GI complaints including reflux, belching, bloating, vague abdominal pain, constipation. Previous long-standing patient of Dr. Sharlett Iles. Takes linzess daily for constipation. MiraLAX on demand. Typically has a bowel movement every day. One wonders if she might be able to use more MiraLAX. As dicyclomine for pain. Recently started on Dexilant for GERD. Seems be helping a bit. Just multiple questions about gas and alternative therapies such as peppermint. Importantly, no bleeding, fevers, or weight loss she has had multiple laboratories and abdominal ultrasound recently. These were unremarkable  REVIEW OF SYSTEMS:  All non-GI ROS negative except for back pain  Past Medical History  Diagnosis Date  . MVP (mitral valve prolapse)   . IBS (irritable bowel syndrome)   . Hemorrhoids   . Hyperlipidemia   . GERD (gastroesophageal reflux disease)   . Gastroparesis   . Polyp, stomach 04-20-10    egd  . Diverticulosis 04-20-10    colonoscopy  . UTI (lower urinary tract infection)   . Chronic constipation   . Anal fissure     History reviewed. No pertinent past surgical history.  Social History Ruth Terry  reports that she has quit smoking. Her smoking use included Cigarettes. She has a 3 pack-year smoking history. She has never used smokeless tobacco. She reports that she drinks alcohol. She reports that she does not use illicit drugs.  family history includes Diabetes in her father; Heart attack in her father; Hypertension in her mother.  Allergies  Allergen Reactions  . Chlordiazepoxide-Clidinium   . Ciprofloxacin   . Sulfa Drugs Cross Reactors Nausea Only  . Nitrofurantoin Monohyd Macro Rash       PHYSICAL EXAMINATION: Vital signs: BP 114/68 mmHg  Pulse 96  Ht 5' (1.524 m)  Wt 108 lb 4 oz (49.102 kg)  BMI 21.14 kg/m2  Constitutional: generally well-appearing, no  acute distress Psychiatric: alert and oriented x3, cooperative Eyes: extraocular movements intact, anicteric, conjunctiva pink Mouth: oral pharynx moist, no lesions Neck: supple no lymphadenopathy Cardiovascular: heart regular rate and rhythm, no murmur Lungs: clear to auscultation bilaterally Abdomen: soft, nontender, nondistended, no obvious ascites, no peritoneal signs, normal bowel sounds, no organomegaly Rectal: Ommitted Extremities: no clubbing or cyanosis lower extremity edema bilaterally Skin: no lesions on visible extremities Neuro: No focal deficits.   ASSESSMENT:  #1. Multiple functional GI complaints. Ongoing #2. GERD #3. Gastroparesis #4. Constipation predominant IBS   PLAN:  #1. Reflux precautions #2. Continue PPI #3. Continue domperidone #4. Continue linzess #5. Okay to increase MiraLAX if needed #6. Phazyme for gas #7. Discussion on gas #8. Reassurance regarding her multiple complaints and workup results  15 minutes was spent face-to-face with the patient. Greater than 50% of the time use for counseling regarding her diagnoses and answering her multiple questions

## 2015-02-24 NOTE — Telephone Encounter (Signed)
Spoke with [employer] regarding prior auth for Dexilant.  Answered some criteria questions that will be submitted.  Should have answer later this week

## 2015-03-03 ENCOUNTER — Other Ambulatory Visit: Payer: Self-pay | Admitting: Physician Assistant

## 2015-03-03 NOTE — Telephone Encounter (Signed)
[employer] called and said Dexilant was approved.  Called pharmacy and asked them to rerun it.  Called patient and left a message it should be ready to pick up

## 2015-03-09 IMAGING — US US ABDOMEN COMPLETE
1 series · 14 of 25 positions shown · non-contrast
Comparison: Abdominal CT scan [DATE]

CLINICAL DATA: Right upper quadrant and midline abdominal pain for
the past week; history of acid reflux and irritable bowel syndrome

EXAM:
ABDOMEN ULTRASOUND COMPLETE

[Series 1: us abdomen complete · 0.19mm/px · 14 of 98 slices shown]
[im 1/98]
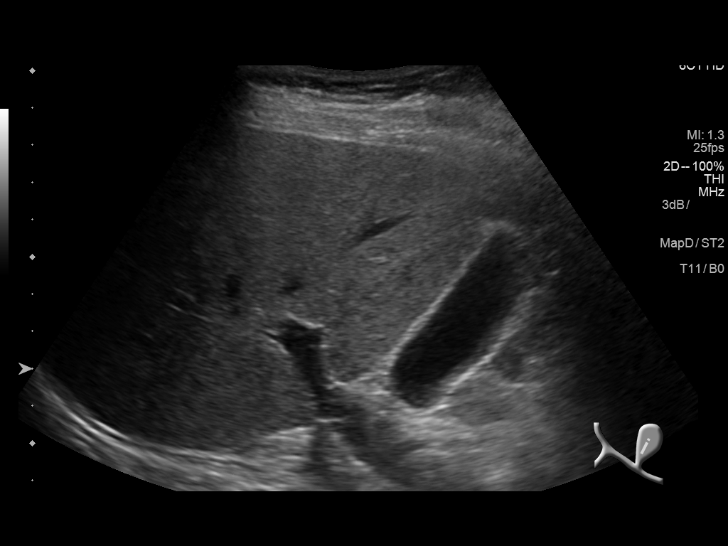
[im 9/98]
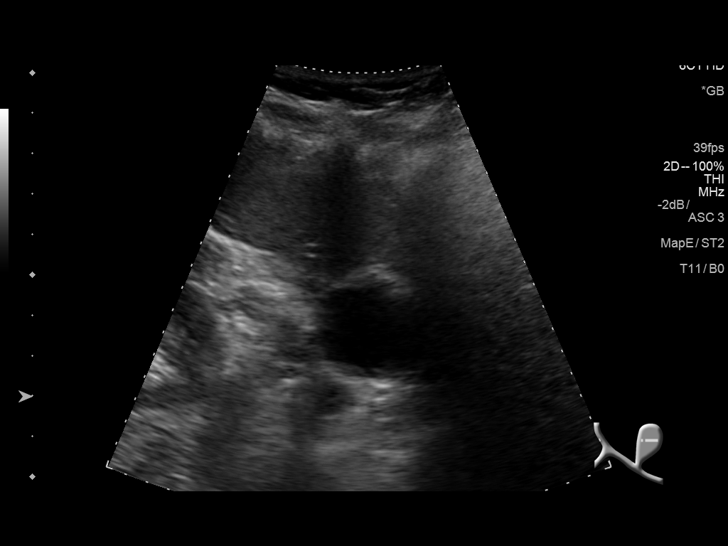
[im 17/98]
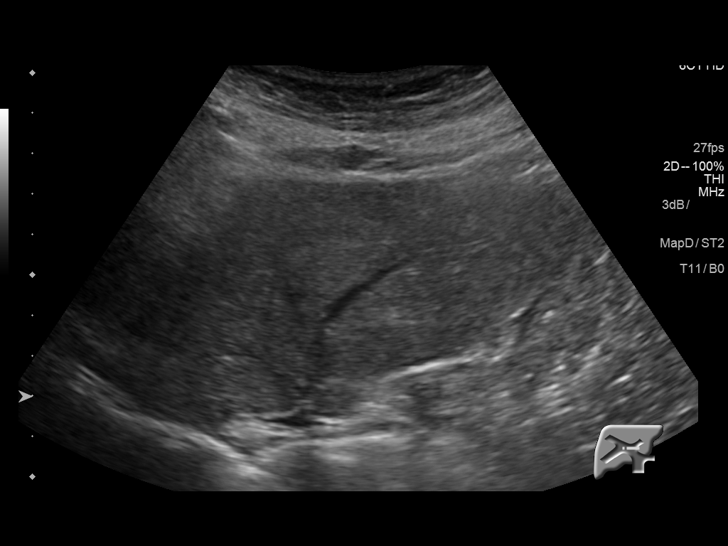
[im 25/98]
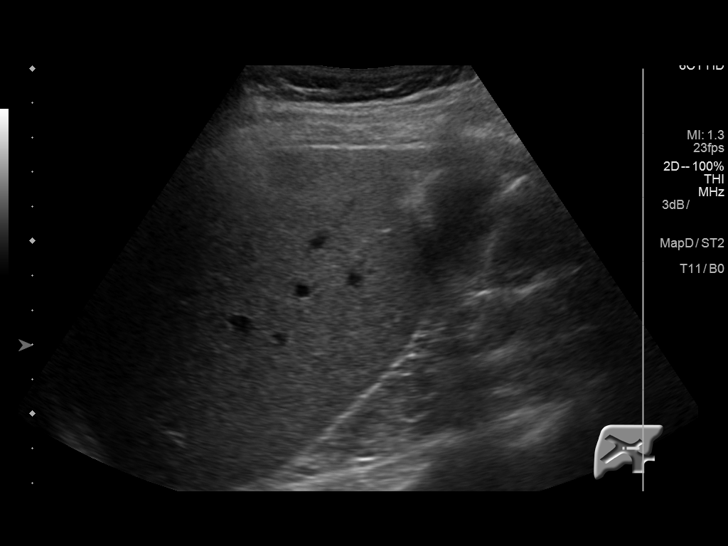
[im 33/98]
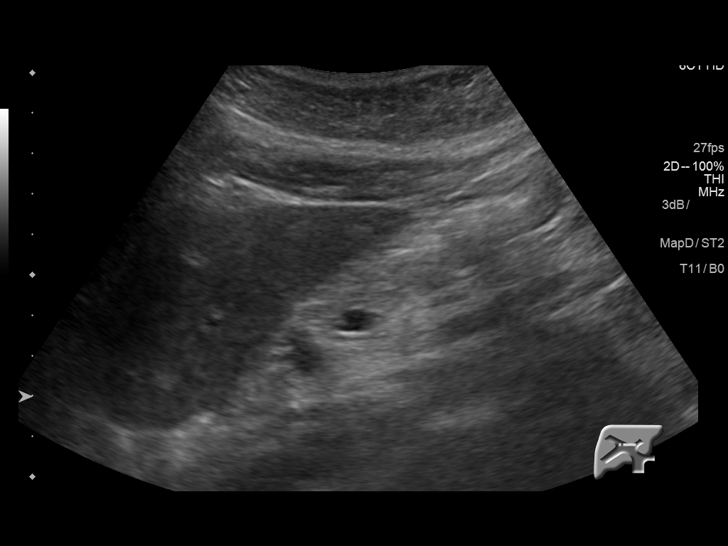
[im 37/98]
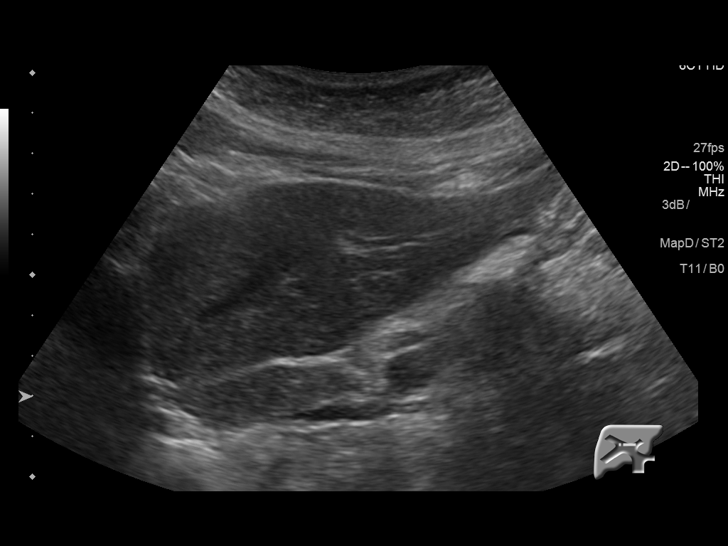
[im 45/98]
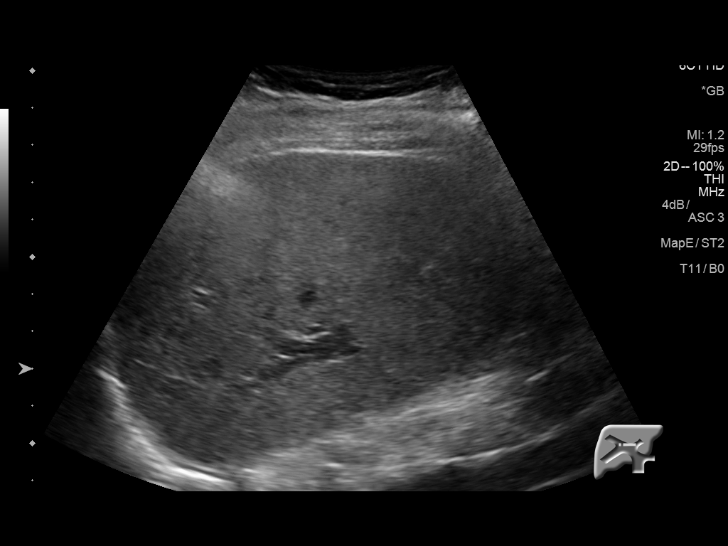
[im 53/98]
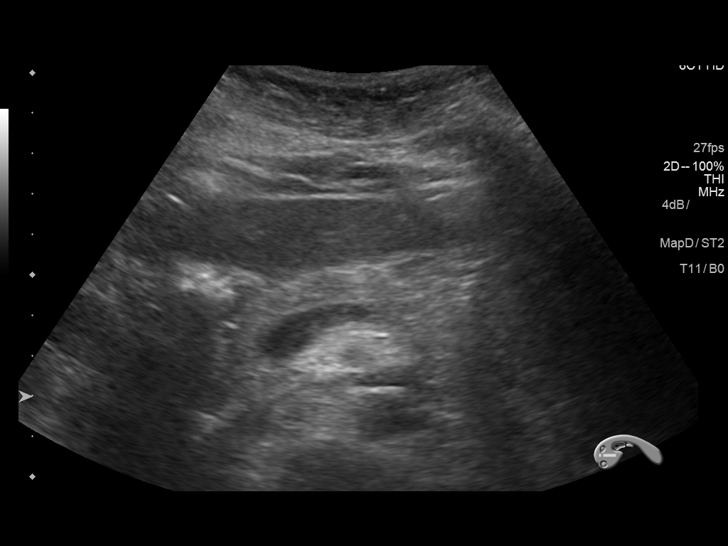
[im 61/98]
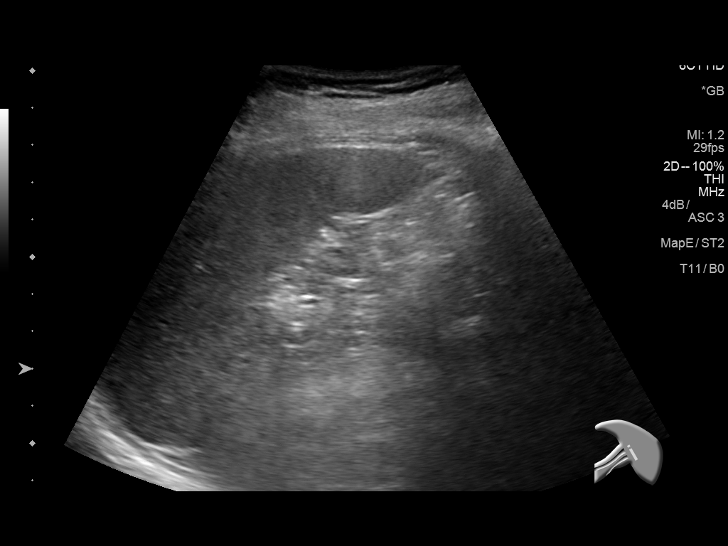
[im 65/98]
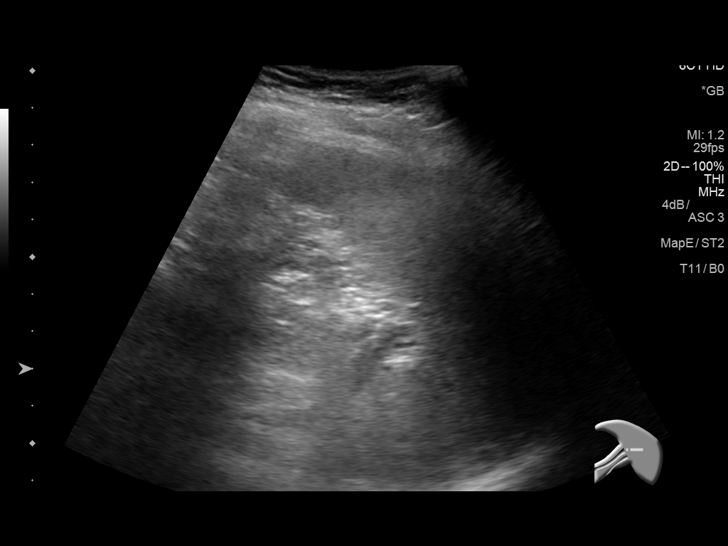
[im 73/98]
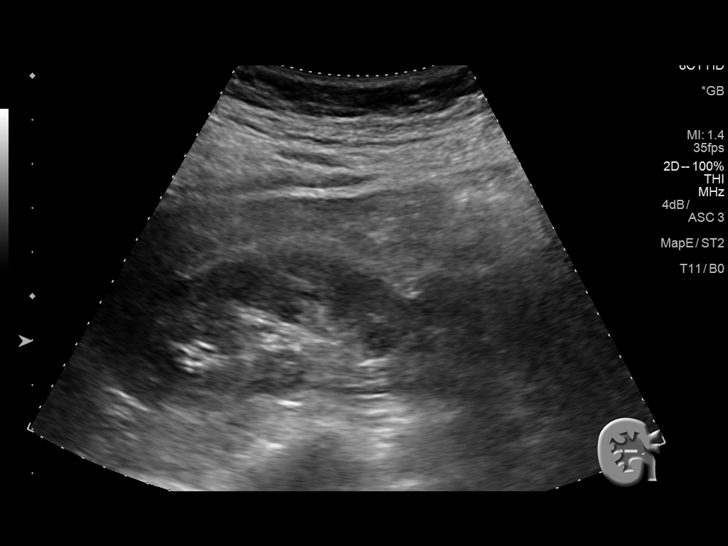
[im 81/98]
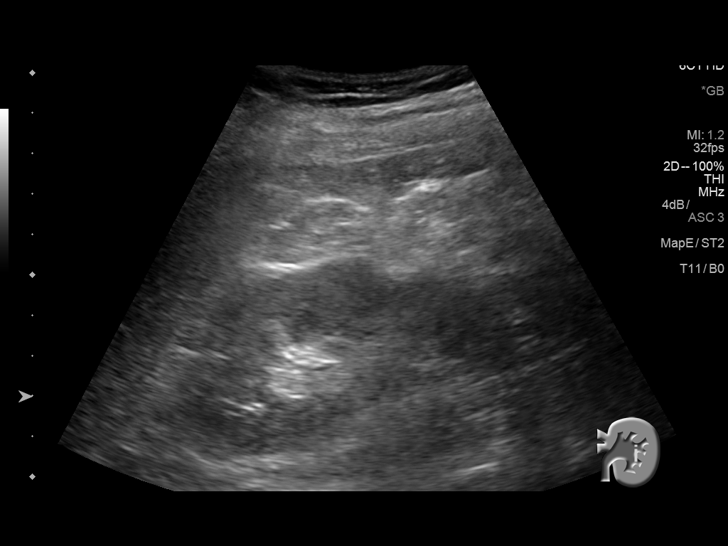
[im 89/98]
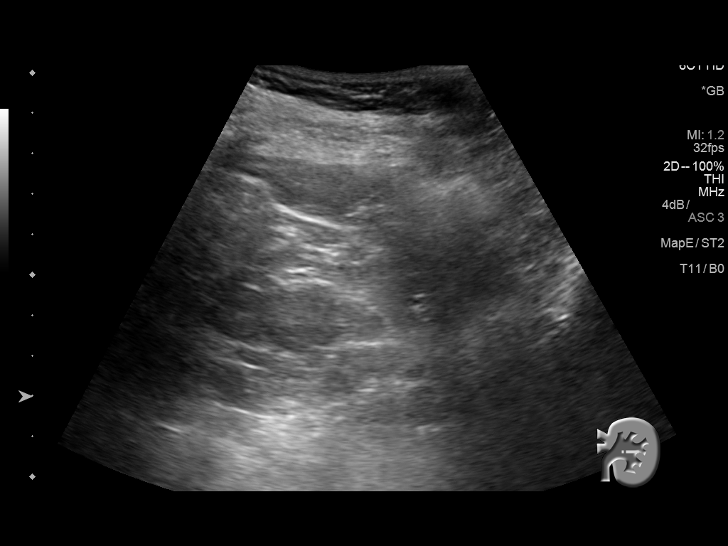
[im 98/98]
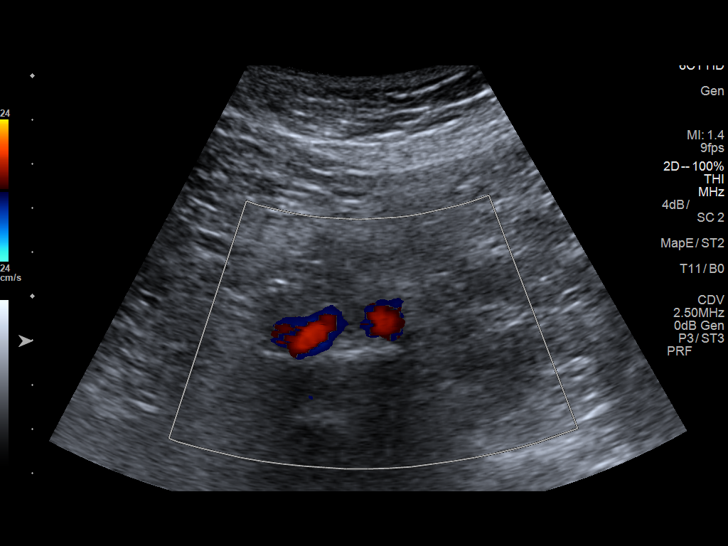

[14 of 25 positions shown; findings below may reference images not displayed]

FINDINGS: Gallbladder: No gallstones or wall thickening visualized. No
sonographic Murphy sign noted by sonographer.

Common bile duct: Diameter: 2.3 mm

Liver: The liver exhibits normal echotexture with no focal mass or
ductal dilation.

IVC: No abnormality visualized.

Pancreas: Visualized portion unremarkable.

Spleen: Size and appearance within normal limits.

Right Kidney: Length: 9 cm. Echogenicity within normal limits. No
mass or hydronephrosis visualized.

Left Kidney: Length: 8.9 cm. Echogenicity within normal limits. No
mass or hydronephrosis visualized.

Abdominal aorta: No aneurysm visualized.

Other findings: The mid and distal aorta were obscured by bowel gas.
The proximal aorta exhibits a diameter 2.4 cm.
IMPRESSION: 1. There is no acute hepatobiliary abnormality. If gallbladder
dysfunction is suspected clinically, a nuclear medicine
hepatobiliary scan may be useful.
2. No acute abnormality is observed elsewhere in the abdomen.

## 2015-03-31 ENCOUNTER — Telehealth: Payer: Self-pay | Admitting: Internal Medicine

## 2015-03-31 NOTE — Telephone Encounter (Signed)
Left message for pt to call back  °

## 2015-03-31 NOTE — Telephone Encounter (Signed)
Pt states that she has internal and external hemorrhoids but is wondering if she may have a fissure. States her bottom is "raw" and very sore. Pt scheduled to see [name] Esterwood PA 04/02/15@3pm . Pt aware of appt.

## 2015-04-02 ENCOUNTER — Ambulatory Visit (INDEPENDENT_AMBULATORY_CARE_PROVIDER_SITE_OTHER): Payer: BLUE CROSS/BLUE SHIELD | Admitting: Physician Assistant

## 2015-04-02 ENCOUNTER — Encounter: Payer: Self-pay | Admitting: Physician Assistant

## 2015-04-02 VITALS — BP 130/62 | HR 108 | Ht 60.0 in | Wt 112.0 lb

## 2015-04-02 DIAGNOSIS — L290 Pruritus ani: Secondary | ICD-10-CM | POA: Diagnosis not present

## 2015-04-02 NOTE — Patient Instructions (Addendum)
Try Recticare 5 % lidocaine . You can get this over the counter, [facility] Faster, [place], [place], [employer].  Balmex cream, apply 4-5 times daily for 2 weeks.  Use moistened wipes and take sitz baths , which is a warm bath.            Normal BMI (Body Mass Index- based on height and weight) is between 19 and 25. Your BMI today is Body mass index is 21.87 kg/(m^2).

## 2015-04-02 NOTE — Progress Notes (Signed)
Patient ID: Ruth Terry, female   DOB: 03-14-1955, 60 y.o.   MRN: [id number]   Subjective:    Patient ID: Ruth Terry, female    DOB: Jul 02, 1955, 60 y.o.   MRN: [id number]  HPI  Ruth Terry is a 60 year old white femalewhite female known to Dr. Marina Goodell with history of GERD and IBS. She also has chronic gastroparesis. She has been maintained on domperidone and takes Linzess daily for chronic constipation. Last colonoscopy done 4 2012 showed mild diverticulosis otherwise negative exam. She was just in the office on 02/12/2015 for routine follow-up. She comes in today as an add-on with complaints of perirectal burning type pain. She says she's been having symptoms for about a week. She is very specific about her complaints and says this does not feel internal but is on the right side of the perianal area. She describes it as a burning and stinging sensation that has been constant. She has not noticed any blood. No changes in her bowel habits  Review of Systems Pertinent positive and negative review of systems were noted in the above HPI section.  All other review of systems was otherwise negative.  Outpatient Encounter Prescriptions as of 04/02/2015  Medication Sig  . acetaminophen (TYLENOL) 500 MG tablet Take 500 mg by mouth every 6 (six) hours as needed.  . AMBULATORY NON FORMULARY MEDICATION Domperidone 10mg  take one at bedtime  . bisacodyl (DULCOLAX) 5 MG EC tablet Take 5 mg by mouth daily as needed for moderate constipation.  . Cholecalciferol (VITAMIN D3) 5000 UNITS CAPS Take 1 capsule by mouth daily.  Marland Kitchen dexlansoprazole (DEXILANT) 60 MG capsule Take 1 capsule (60 mg total) by mouth daily. (Patient taking differently: Take 60 mg by mouth as needed. )  . dicyclomine (BENTYL) 10 MG capsule Take one po TID prn  . LINZESS 145 MCG CAPS capsule TAKE ONE (1) CAPSULE EACH DAY  . Meth-Hyo-M Bl-Na Phos-Ph Sal (URIBEL) 118 MG CAPS Take 1 capsule by mouth as needed.  . Probiotic Product (ALIGN) 4 MG CAPS Take 1  capsule by mouth daily.  . psyllium (METAMUCIL) 58.6 % packet Take 1 packet by mouth daily.  . [DISCONTINUED] ibuprofen (ADVIL,MOTRIN) 800 MG tablet Take 1 tablet (800 mg total) by mouth 3 (three) times daily. (Patient taking differently: Take 800 mg by mouth as needed. )   No facility-administered encounter medications on file as of 04/02/2015.   Allergies  Allergen Reactions  . Chlordiazepoxide-Clidinium   . Ciprofloxacin   . Sulfa Drugs Cross Reactors Nausea Only  . Nitrofurantoin Monohyd Macro Rash   Patient Active Problem List   Diagnosis Date Noted  . Internal hemorrhoids 05/14/2014  . GERD (gastroesophageal reflux disease) 09/17/2011  . Abdominal pain, epigastric 12/08/2010  . Tobacco abuse 09/09/2010  . Stress 08/25/2010  . IBS (irritable bowel syndrome) 08/25/2010  . Chest pain, atypical 08/25/2010  . History of IBS 07/28/2010  . Gastroparesis 06/30/2010  . Drug reaction 06/30/2010  . Irritable bowel syndrome 04/03/2010  . Non-ulcer dyspepsia 04/03/2010   Social History   Social History  . Marital Status: Married    Spouse Name: N/A  . Number of Children: 0  . Years of Education: N/A   Occupational History  . Magazine features editor school program  .     Social History Main Topics  . Smoking status: Former Smoker -- 0.50 packs/day for 6 years    Types: Cigarettes  . Smokeless tobacco: Never Used     Comment:  USE SMOKELESS CIGARETTES  . Alcohol Use: 0.0 oz/week    0 Standard drinks or equivalent per week     Comment: 1 a week  . Drug Use: No  . Sexual Activity: Not on file   Other Topics Concern  . Not on file   Social History Narrative    Ruth Terry's family history includes Diabetes in her father; Heart attack in her father; Hypertension in her mother.      Objective:    Filed Vitals:   04/02/15 1455  BP: 130/62  Pulse: 108    Physical Exam well-developed white female in no acute distress, blood pressure 130/62 pulse 100  height 5 foot weight 112. HEENT; nontraumatic normocephalic EOMI PERRLA sclera anicteric, Cardiovascular ;regular rate and rhythm with S1-S2 no murmur or gallop, Pulmonary; clear bilaterally, Abdomen ;soft nontender nondistended, Rectal;, digital exam not done patient points to the area of concern which is in the right perianal tissue there is a tiny excoriation which is erythematous, no drainage, no surrounding induration     Assessment & Plan:   #1 60 yo female With 1 week history of perianal burning. On exam she has a tiny excoriation in the right perianal tissue. Of unclear etiology this does not appear to be a fistula, question tiny herpetic lesion( pt has no Hx) #2 GERD #3 gastroparesis #4 IBS chronic constipation  Plan; patient will be given a trial of lidocaine/recticare 4% gel to apply as needed and also use Balmex cream 3-4 times daily until symptoms resolve. Patient is asked to give symptoms a couple of weeks and if no improvement can return for reevaluation.   Carliss Quast S Cesia Orf PA-C 04/02/2015   Cc: Samuel Jester, DO

## 2015-04-03 NOTE — Progress Notes (Signed)
You could have her see a dermatologist if this persists

## 2015-04-16 DIAGNOSIS — H60511 Acute actinic otitis externa, right ear: Secondary | ICD-10-CM | POA: Diagnosis not present

## 2015-04-16 DIAGNOSIS — H608X1 Other otitis externa, right ear: Secondary | ICD-10-CM | POA: Diagnosis not present

## 2015-04-28 ENCOUNTER — Telehealth: Payer: Self-pay | Admitting: Internal Medicine

## 2015-04-28 DIAGNOSIS — K5901 Slow transit constipation: Secondary | ICD-10-CM | POA: Diagnosis not present

## 2015-04-28 DIAGNOSIS — R3989 Other symptoms and signs involving the genitourinary system: Secondary | ICD-10-CM | POA: Diagnosis not present

## 2015-04-28 NOTE — Telephone Encounter (Signed)
Pt states she was taking erythromycin for ear infection, pt reports she finished this last Sunday. Pt reports that since Wednesday she has been having nausea, chills, and back pain. States her poop is dark green and it is like she has a "bacterial infection in her stomach." Pt thinks her back pain may be related to not emptying completely and being constipated. Pt thought the back pain could have been related to UTI but saw urology and was clear. Please advise.

## 2015-04-28 NOTE — Telephone Encounter (Signed)
Left message for pt regarding Dr. Blanch Media recommendations below.

## 2015-04-28 NOTE — Telephone Encounter (Signed)
Not sure what to make of this. She should follow-up with her PCP. Could take MiraLAX if she is constipated

## 2015-04-28 NOTE — Telephone Encounter (Signed)
Pt called back and is not happy with recommendation. Pt is concerned that she may have bacterial overgrowth and does not think this is an issue for her PCP. Pt offered appt with [facility]. Pt scheduled to see Alonza Bogus PA 05/01/15@1 :30pm, pt aware of appt.

## 2015-04-30 DIAGNOSIS — Z1151 Encounter for screening for human papillomavirus (HPV): Secondary | ICD-10-CM | POA: Diagnosis not present

## 2015-04-30 DIAGNOSIS — Z01419 Encounter for gynecological examination (general) (routine) without abnormal findings: Secondary | ICD-10-CM | POA: Diagnosis not present

## 2015-04-30 DIAGNOSIS — Z6821 Body mass index (BMI) 21.0-21.9, adult: Secondary | ICD-10-CM | POA: Diagnosis not present

## 2015-05-01 ENCOUNTER — Ambulatory Visit (INDEPENDENT_AMBULATORY_CARE_PROVIDER_SITE_OTHER): Payer: BLUE CROSS/BLUE SHIELD | Admitting: Gastroenterology

## 2015-05-01 ENCOUNTER — Encounter: Payer: Self-pay | Admitting: Gastroenterology

## 2015-05-01 VITALS — BP 104/70 | HR 96 | Ht 60.0 in | Wt 111.0 lb

## 2015-05-01 DIAGNOSIS — K589 Irritable bowel syndrome without diarrhea: Secondary | ICD-10-CM | POA: Diagnosis not present

## 2015-05-01 DIAGNOSIS — K219 Gastro-esophageal reflux disease without esophagitis: Secondary | ICD-10-CM | POA: Diagnosis not present

## 2015-05-01 MED ORDER — SACCHAROMYCES BOULARDII 250 MG PO CAPS: 250.0000 mg | ORAL_CAPSULE | Freq: Two times a day (BID) | ORAL | Status: DC

## 2015-05-01 MED ORDER — SUCRALFATE 1 GM/10ML PO SUSP: 1.0000 g | Freq: Three times a day (TID) | ORAL | Status: DC

## 2015-05-01 NOTE — Patient Instructions (Signed)
[facility] [place]. 1. Florastor 2. Carafate Suspension  Start Miralax, 1 dose, 17 grams in 8 oz of water or juice daily . Try Essential oil.

## 2015-05-02 ENCOUNTER — Other Ambulatory Visit: Payer: Self-pay | Admitting: *Deleted

## 2015-05-02 ENCOUNTER — Telehealth: Payer: Self-pay | Admitting: *Deleted

## 2015-05-02 MED ORDER — SUCRALFATE 1 G PO TABS: 1.0000 g | ORAL_TABLET | Freq: Three times a day (TID) | ORAL | Status: DC

## 2015-05-02 NOTE — Telephone Encounter (Signed)
Spoke to the patient and advised since the Carafate suspension is not covered by insurance, I will send the carafate tablets. I advised she can crush one tablet on a spoon and add water to it and swallow the mixture.  She thanked me for calling and asked me to send this to [facility] in [place].

## 2015-05-05 ENCOUNTER — Other Ambulatory Visit (HOSPITAL_COMMUNITY)
Admission: RE | Admit: 2015-05-05 | Discharge: 2015-05-05 | Disposition: A | Discharge status: Home / Self Care | Payer: BLUE CROSS/BLUE SHIELD | Source: Other Acute Inpatient Hospital | Attending: Gastroenterology | Admitting: Gastroenterology

## 2015-05-05 DIAGNOSIS — K589 Irritable bowel syndrome without diarrhea: Secondary | ICD-10-CM | POA: Insufficient documentation

## 2015-05-05 DIAGNOSIS — H04123 Dry eye syndrome of bilateral lacrimal glands: Secondary | ICD-10-CM | POA: Diagnosis not present

## 2015-05-05 DIAGNOSIS — H40033 Anatomical narrow angle, bilateral: Secondary | ICD-10-CM | POA: Diagnosis not present

## 2015-05-06 LAB — GASTROINTESTINAL PANEL BY PCR, STOOL (REPLACES STOOL CULTURE)
ADENOVIRUS F40/41: NOT DETECTED
Astrovirus: NOT DETECTED
CRYPTOSPORIDIUM: NOT DETECTED
Campylobacter species: NOT DETECTED
Cyclospora cayetanensis: NOT DETECTED
E. coli O157: NOT DETECTED
ENTAMOEBA HISTOLYTICA: NOT DETECTED
Enteroaggregative E coli (EAEC): NOT DETECTED
Enteropathogenic E coli (EPEC): NOT DETECTED
Enterotoxigenic E coli (ETEC): NOT DETECTED
GIARDIA LAMBLIA: NOT DETECTED
Norovirus GI/GII: NOT DETECTED
Plesimonas shigelloides: NOT DETECTED
Rotavirus A: NOT DETECTED
Salmonella species: NOT DETECTED
Sapovirus (I, II, IV, and V): NOT DETECTED
Shiga like toxin producing E coli (STEC): NOT DETECTED
Shigella/Enteroinvasive E coli (EIEC): NOT DETECTED
Vibrio cholerae: NOT DETECTED
Vibrio species: NOT DETECTED
YERSINIA ENTEROCOLITICA: NOT DETECTED

## 2015-05-07 ENCOUNTER — Encounter: Payer: Self-pay | Admitting: Gastroenterology

## 2015-05-07 DIAGNOSIS — R928 Other abnormal and inconclusive findings on diagnostic imaging of breast: Secondary | ICD-10-CM | POA: Diagnosis not present

## 2015-05-07 DIAGNOSIS — N6489 Other specified disorders of breast: Secondary | ICD-10-CM | POA: Diagnosis not present

## 2015-05-07 NOTE — Progress Notes (Signed)
05/01/2015 Ruth Terry [id number] 1955-02-03   History of Present Illness:  This is a 60 year old female known to Dr. Marina Goodell with GERD/gastroparesis and IBS.  Comes in today complaining of incomplete evacuation of stools, gas/bloating, and nausea.  She says that she moves her bowels several times through the day in small amounts instead of just once or twice because she feels like she never completely evacuates.  She takes Metamucil daily and uses Align as a probiotic as well as linzess 145 mcg daily.  Also takes Dexilant daily.  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  She is asking for carafate stating that this helped with her nausea/GERD issues in the past.  I suggested that she try FDgard or IBgard with peppermint oil/carraway seed oil extracts, respectively.  Then she showed me an essential oil that she purchased that contained both of those along with ginger, etc that she prefers to try instead.  Last colonoscopy done 04/2010 showed mild diverticulosis, otherwise negative exam.   Current Medications, Allergies, Past Medical History, Past Surgical History, Family History and Social History were reviewed in [employer] record.   Physical Exam: BP 104/70 mmHg  Pulse 96  Ht 5' (1.524 m)  Wt 111 lb (50.349 kg)  BMI 21.68 kg/m2 General: Well developed white female in no acute distress Head: Normocephalic and atraumatic Eyes:  Sclerae anicteric, conjunctiva pink  Ears: Normal auditory acuity Lungs: Clear throughout to auscultation Heart: Regular rate and rhythm Abdomen: Soft, non-distended.  Normal bowel sounds.  Non-tender. Musculoskeletal: Symmetrical with no gross deformities  Extremities: No edema  Neurological: Alert oriented x 4, grossly  non-focal Psychological:  Alert and cooperative. Normal mood and affect  Assessment and Recommendations: -60 year old female with GERD and IBS.  Symptoms are highly functional.  Complaining of incomplete evacuation of stools, gas/bloating, and nausea.  I have asked her to add daily Miralax into her bowel regimen so that it can work in conjunction with the Metamucil.  Will try florastor probiotic instead of Align.  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  She is asking for carafate stating that this helped with her nausea/GERD issues in the past.  I suggested that she try FDgard or IBgard with peppermint oil/carraway seed oil extracts, respectively.  Then she showed me an essential oil that she purchased that contained both of those along with ginger, etc that she prefers to try instead.

## 2015-05-12 NOTE — Progress Notes (Signed)
Highly functional patient with frequent OV's for for functional GI complaints. Nothing to add

## 2015-05-14 DIAGNOSIS — B029 Zoster without complications: Secondary | ICD-10-CM | POA: Diagnosis not present

## 2015-05-30 DIAGNOSIS — H578 Other specified disorders of eye and adnexa: Secondary | ICD-10-CM | POA: Diagnosis not present

## 2015-05-31 DIAGNOSIS — H578 Other specified disorders of eye and adnexa: Secondary | ICD-10-CM | POA: Diagnosis not present

## 2015-06-05 ENCOUNTER — Other Ambulatory Visit: Payer: Self-pay | Admitting: Internal Medicine

## 2015-06-05 DIAGNOSIS — H578 Other specified disorders of eye and adnexa: Secondary | ICD-10-CM | POA: Diagnosis not present

## 2015-06-10 DIAGNOSIS — K3184 Gastroparesis: Secondary | ICD-10-CM | POA: Diagnosis not present

## 2015-06-10 DIAGNOSIS — E785 Hyperlipidemia, unspecified: Secondary | ICD-10-CM | POA: Diagnosis not present

## 2015-06-10 DIAGNOSIS — K219 Gastro-esophageal reflux disease without esophagitis: Secondary | ICD-10-CM | POA: Diagnosis not present

## 2015-06-10 DIAGNOSIS — K5909 Other constipation: Secondary | ICD-10-CM | POA: Diagnosis not present

## 2015-06-10 DIAGNOSIS — G44219 Episodic tension-type headache, not intractable: Secondary | ICD-10-CM | POA: Diagnosis not present

## 2015-07-01 ENCOUNTER — Telehealth: Payer: Self-pay | Admitting: Internal Medicine

## 2015-07-01 NOTE — Telephone Encounter (Signed)
Pt states she is going to the bathroom but she is not having a "complete BM." Reports she is only having small stools. Discussed with pt that she can try increasing the miralax to 2-3 doses a day as needed. Pt will try this and see how it works.

## 2015-07-04 ENCOUNTER — Telehealth: Payer: Self-pay | Admitting: Internal Medicine

## 2015-07-04 MED ORDER — DEXLANSOPRAZOLE 60 MG PO CPDR: 60.0000 mg | DELAYED_RELEASE_CAPSULE | Freq: Every day | ORAL | Status: DC

## 2015-07-04 MED ORDER — LINACLOTIDE 145 MCG PO CAPS: ORAL_CAPSULE | ORAL | Status: DC

## 2015-07-04 NOTE — Telephone Encounter (Signed)
Told patient I refilled Dexilant and Linzess.  Patient acknowledged and understood

## 2015-07-16 DIAGNOSIS — N9089 Other specified noninflammatory disorders of vulva and perineum: Secondary | ICD-10-CM | POA: Diagnosis not present

## 2015-07-16 DIAGNOSIS — Z6821 Body mass index (BMI) 21.0-21.9, adult: Secondary | ICD-10-CM | POA: Diagnosis not present

## 2015-07-19 ENCOUNTER — Emergency Department (HOSPITAL_COMMUNITY): Payer: BLUE CROSS/BLUE SHIELD

## 2015-07-19 ENCOUNTER — Emergency Department (HOSPITAL_COMMUNITY)
Admission: EM | Admit: 2015-07-19 | Discharge: 2015-07-19 | Disposition: A | Discharge status: Home / Self Care | Payer: BLUE CROSS/BLUE SHIELD | Attending: Emergency Medicine | Admitting: Emergency Medicine

## 2015-07-19 ENCOUNTER — Encounter (HOSPITAL_COMMUNITY): Payer: Self-pay | Admitting: Emergency Medicine

## 2015-07-19 DIAGNOSIS — B373 Candidiasis of vulva and vagina: Secondary | ICD-10-CM | POA: Insufficient documentation

## 2015-07-19 DIAGNOSIS — Z87891 Personal history of nicotine dependence: Secondary | ICD-10-CM | POA: Insufficient documentation

## 2015-07-19 DIAGNOSIS — R0789 Other chest pain: Secondary | ICD-10-CM | POA: Insufficient documentation

## 2015-07-19 DIAGNOSIS — B3731 Acute candidiasis of vulva and vagina: Secondary | ICD-10-CM

## 2015-07-19 DIAGNOSIS — R079 Chest pain, unspecified: Secondary | ICD-10-CM | POA: Diagnosis not present

## 2015-07-19 DIAGNOSIS — E785 Hyperlipidemia, unspecified: Secondary | ICD-10-CM | POA: Insufficient documentation

## 2015-07-19 LAB — TROPONIN I

## 2015-07-19 LAB — CBC
HCT: 44.9 % (ref 36.0–46.0)
HEMOGLOBIN: 15.4 g/dL — AB (ref 12.0–15.0)
MCH: 31 pg (ref 26.0–34.0)
MCHC: 34.3 g/dL (ref 30.0–36.0)
MCV: 90.3 fL (ref 78.0–100.0)
Platelets: 216 10*3/uL (ref 150–400)
RBC: 4.97 MIL/uL (ref 3.87–5.11)
RDW: 13.3 % (ref 11.5–15.5)
WBC: 4.6 10*3/uL (ref 4.0–10.5)

## 2015-07-19 LAB — COMPREHENSIVE METABOLIC PANEL
ALK PHOS: 69 U/L (ref 38–126)
ALT: 21 U/L (ref 14–54)
ANION GAP: 7 (ref 5–15)
AST: 21 U/L (ref 15–41)
Albumin: 4.5 g/dL (ref 3.5–5.0)
BILIRUBIN TOTAL: 0.4 mg/dL (ref 0.3–1.2)
BUN: 8 mg/dL (ref 6–20)
CALCIUM: 9.6 mg/dL (ref 8.9–10.3)
CO2: 26 mmol/L (ref 22–32)
Chloride: 108 mmol/L (ref 101–111)
Creatinine, Ser: 0.67 mg/dL (ref 0.44–1.00)
GLUCOSE: 105 mg/dL — AB (ref 65–99)
POTASSIUM: 3.6 mmol/L (ref 3.5–5.1)
Sodium: 141 mmol/L (ref 135–145)
TOTAL PROTEIN: 7.4 g/dL (ref 6.5–8.1)

## 2015-07-19 LAB — WET PREP, GENITAL
Clue Cells Wet Prep HPF POC: NONE SEEN
Sperm: NONE SEEN
Trich, Wet Prep: NONE SEEN

## 2015-07-19 LAB — URINALYSIS, ROUTINE W REFLEX MICROSCOPIC
BILIRUBIN URINE: NEGATIVE
Glucose, UA: NEGATIVE mg/dL
Hgb urine dipstick: NEGATIVE
KETONES UR: NEGATIVE mg/dL
Leukocytes, UA: NEGATIVE
NITRITE: NEGATIVE
PH: 6 (ref 5.0–8.0)
PROTEIN: NEGATIVE mg/dL
Specific Gravity, Urine: <1.005 — ABNORMAL LOW (ref 1.005–1.030)

## 2015-07-19 LAB — D-DIMER, QUANTITATIVE (NOT AT ARMC): D DIMER QUANT: 0.27 ug{FEU}/mL (ref 0.00–0.50)

## 2015-07-19 LAB — LIPASE, BLOOD: Lipase: 28 U/L (ref 11–51)

## 2015-07-19 IMAGING — DX DG CHEST 2V
2 series · 2 of 2 positions shown · non-contrast
Comparison: [DATE]

CLINICAL DATA: 60-year-old female with a history of chest pain

EXAM:
CHEST  2 VIEW

[chest pa]
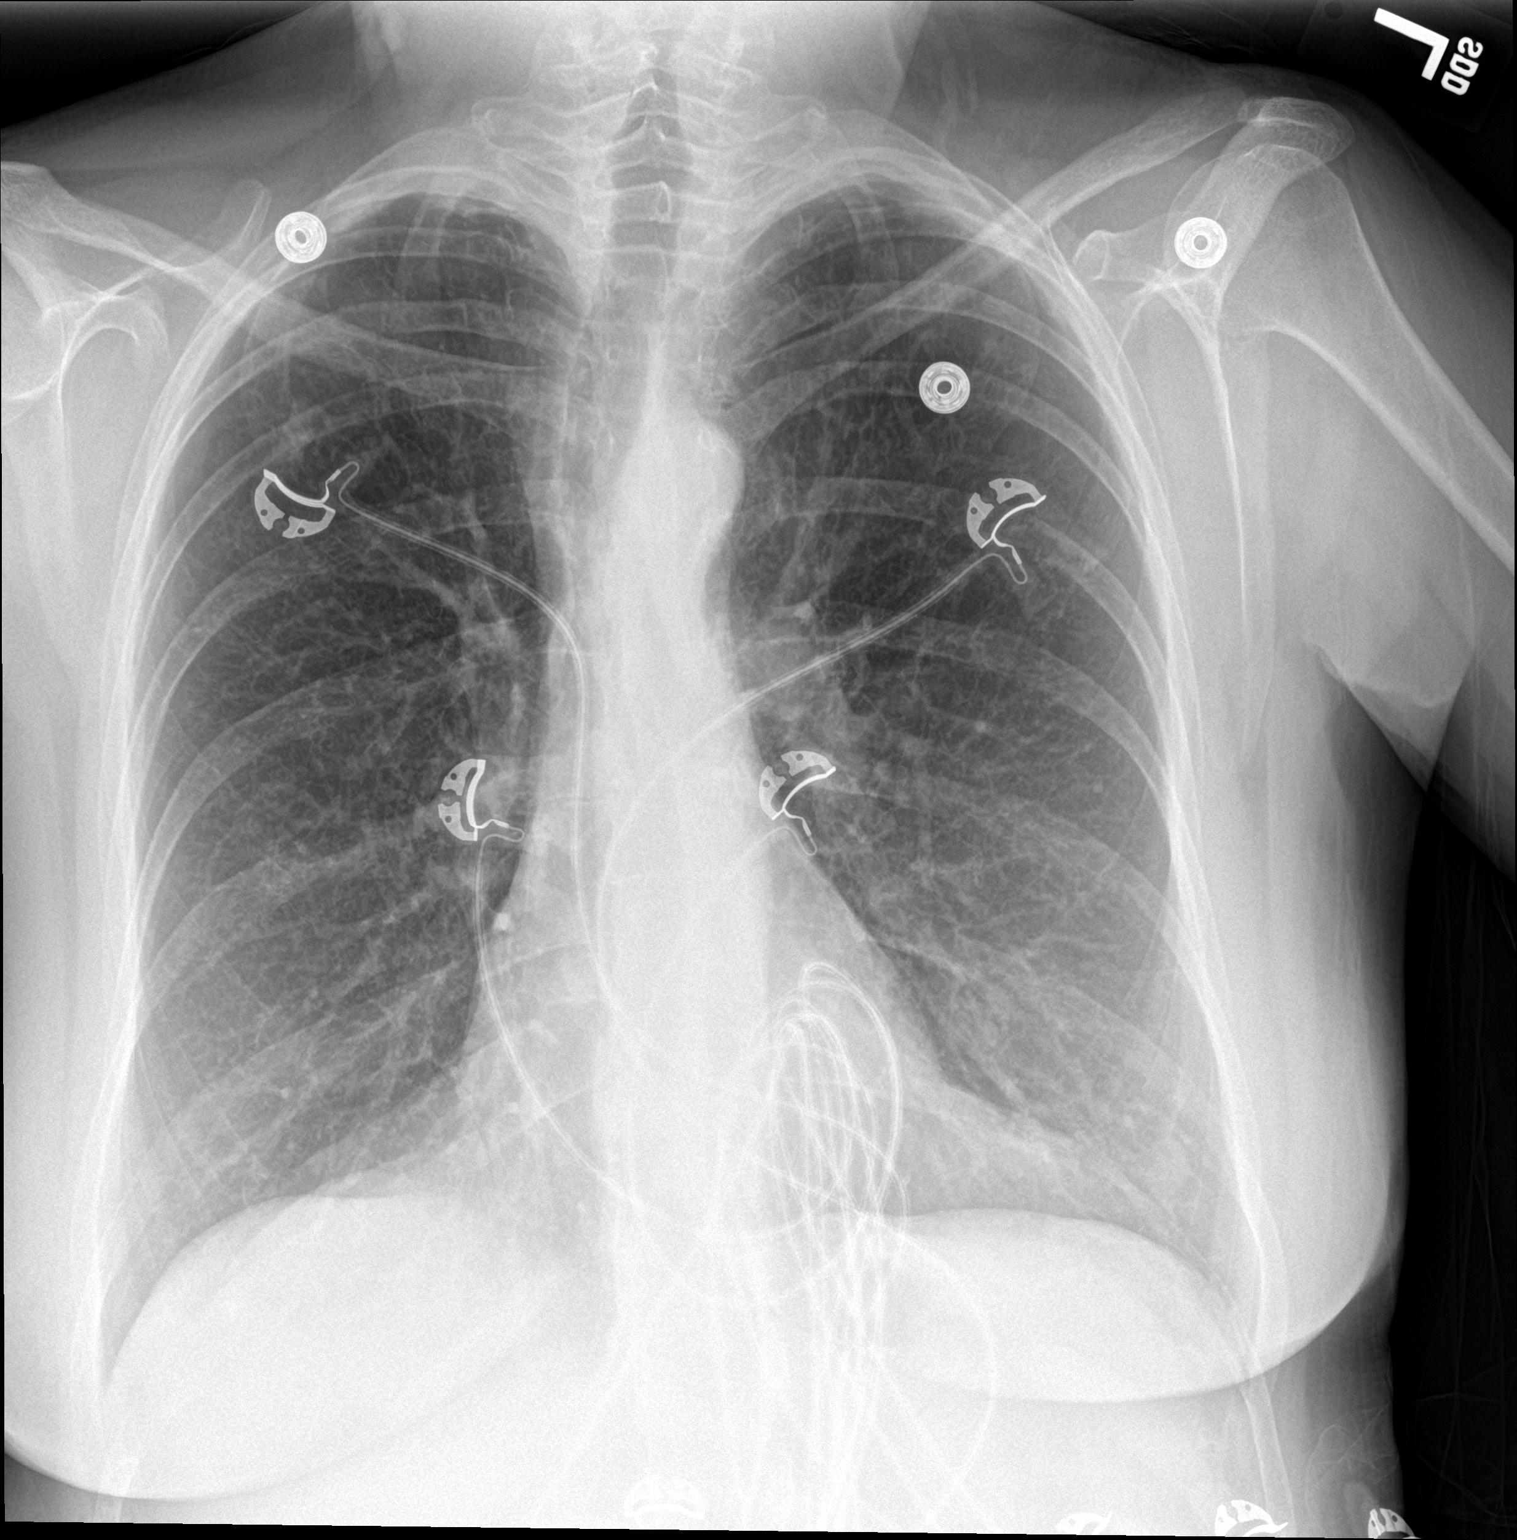

[chest lat]
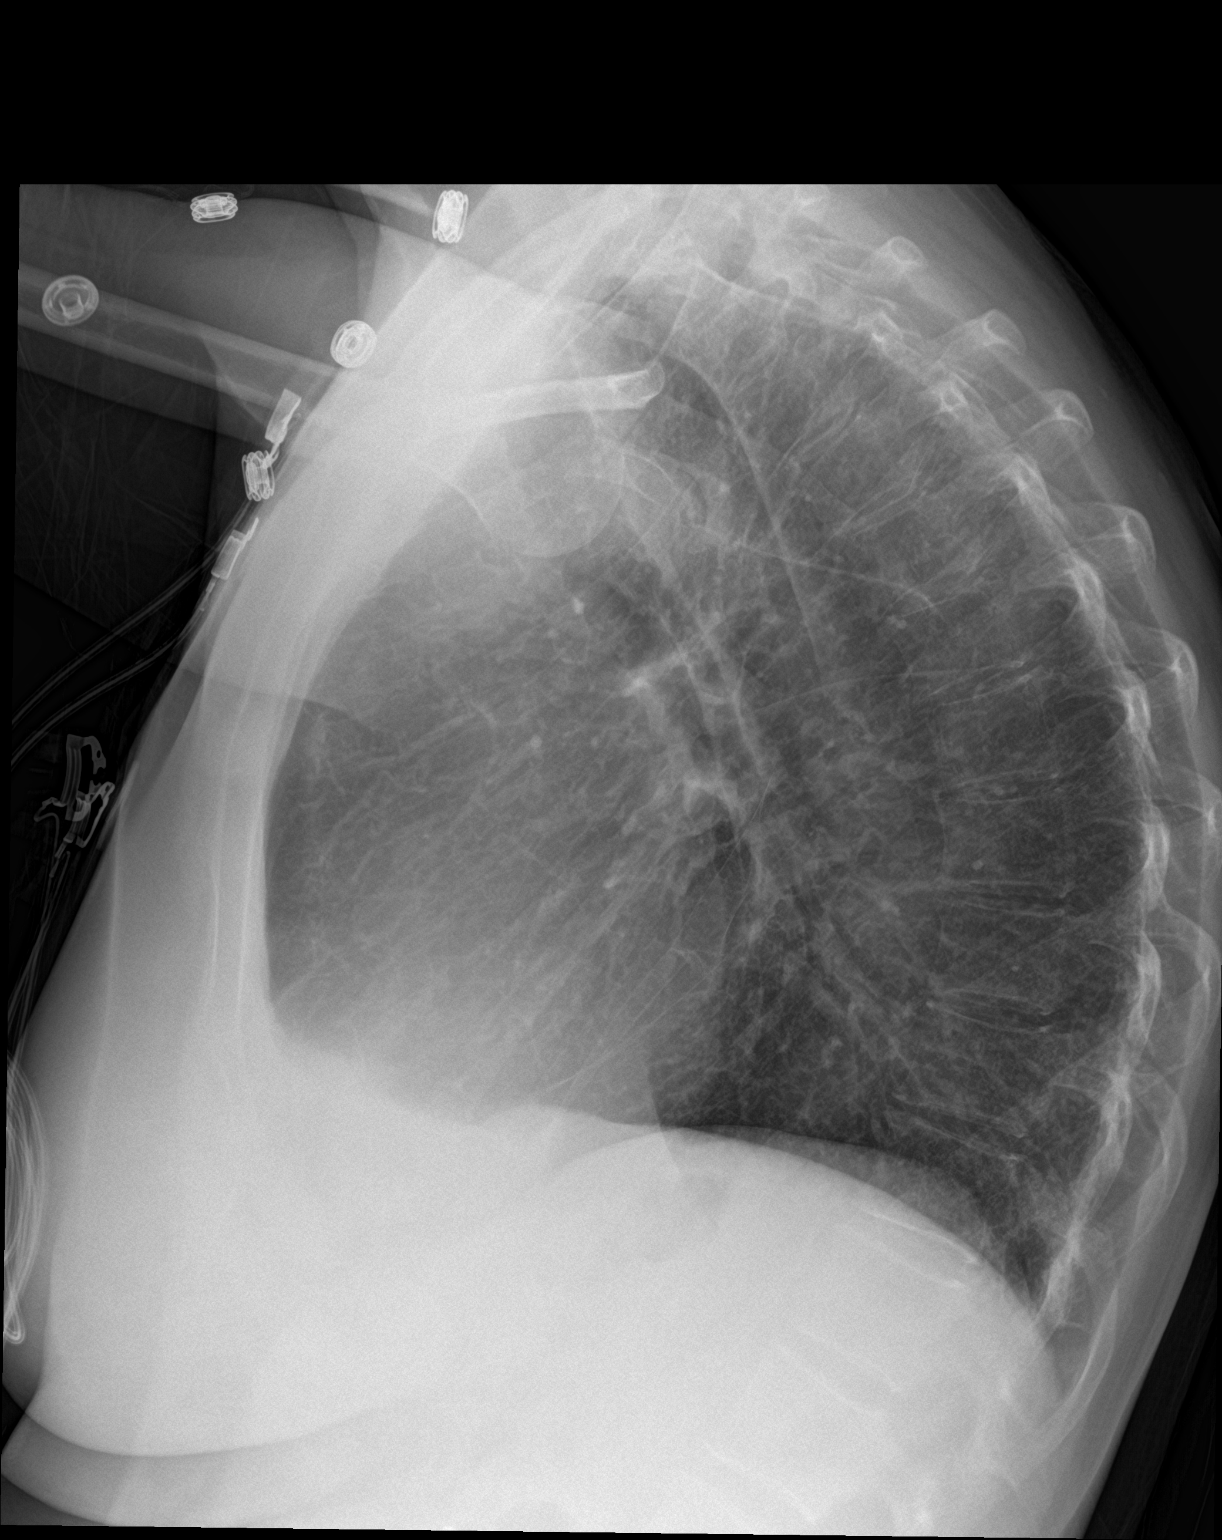

[2 of 2 positions shown; findings below may reference images not displayed]

FINDINGS: Cardiomediastinal silhouette unchanged. Calcifications of the aortic
arch.

No pneumothorax or pleural effusion.  No confluent airspace disease.

Stigmata of emphysema, with increased retrosternal airspace,
flattened hemidiaphragms, increased AP diameter, and hyperinflation
on the AP view.
IMPRESSION: Background of emphysema without evidence of superimposed acute
cardiopulmonary disease.

Aortic atherosclerosis

## 2015-07-19 MED ORDER — KETOROLAC TROMETHAMINE 10 MG PO TABS: 10.0000 mg | ORAL_TABLET | Freq: Four times a day (QID) | ORAL | Status: DC | PRN

## 2015-07-19 MED ORDER — FLUCONAZOLE 100 MG PO TABS
150.0000 mg | ORAL_TABLET | Freq: Once | ORAL | Status: AC
  Administered 2015-07-19: 150 mg via ORAL
  Filled 2015-07-19: qty 2

## 2015-07-19 NOTE — ED Notes (Signed)
Lab at bedside

## 2015-07-19 NOTE — ED Provider Notes (Signed)
CSN: [id number]     Arrival date & time 07/19/15  1155 History   First MD Initiated Contact with Patient 07/19/15 1208     Chief Complaint  Patient presents with  . Chest Pain   Pt is a 60 yo wf with no prior cardiac hx who presents with CP.  The pt said that she has cp to her right and left chest.  The pt said that it's been going on for about 1 week.  The pt denies any sob.  Pt also some pain and irritation around rectum and she wants me to take a look at that.  She is concerned she has shingles or a yeast infection. Pt denies any n/v/d.  (Consider location/radiation/quality/duration/timing/severity/associated sxs/prior Treatment) Patient is a 60 y.o. female presenting with chest pain.  Chest Pain Pain location:  L chest and R chest Pain quality: pressure   Pain radiates to:  Does not radiate Pain radiates to the back: no   Pain severity:  Mild Onset quality:  Gradual Timing:  Intermittent Progression:  Unchanged   Past Medical History  Diagnosis Date  . MVP (mitral valve prolapse)   . IBS (irritable bowel syndrome)   . Hemorrhoids   . Hyperlipidemia   . GERD (gastroesophageal reflux disease)   . Gastroparesis   . Polyp, stomach 04-20-10    egd  . Diverticulosis 04-20-10    colonoscopy  . UTI (lower urinary tract infection)   . Chronic constipation   . Anal fissure    History reviewed. No pertinent past surgical history. Family History  Problem Relation Age of Onset  . Hypertension Mother   . Diabetes Father   . Heart attack Father     Age 58   Social History  Substance Use Topics  . Smoking status: Former Smoker -- 0.50 packs/day for 6 years    Types: Cigarettes  . Smokeless tobacco: Never Used     Comment: USE SMOKELESS CIGARETTES  . Alcohol Use: 0.0 oz/week    0 Standard drinks or equivalent per week     Comment: 1 a week   OB History    No data available     Review of Systems  Cardiovascular: Positive for chest pain.  Skin: Positive for rash.  All  other systems reviewed and are negative.     Allergies  Chlordiazepoxide-clidinium; Ciprofloxacin; Flagyl; Sulfa drugs cross reactors; and Nitrofurantoin monohyd macro  Home Medications   Prior to Admission medications   Medication Sig Start Date End Date Taking? Authorizing Provider  AMBULATORY NON FORMULARY MEDICATION Domperidone 10mg  take one at bedtime 12/03/14   Irene Shipper, MD  bisacodyl (DULCOLAX) 5 MG EC tablet Take 5 mg by mouth daily as needed for moderate constipation.    Historical Provider, MD  Cholecalciferol (VITAMIN D3) 5000 UNITS CAPS Take 1 capsule by mouth daily.    Historical Provider, MD  dexlansoprazole (DEXILANT) 60 MG capsule Take 1 capsule (60 mg total) by mouth daily. 07/04/15   Irene Shipper, MD  dicyclomine (BENTYL) 10 MG capsule Take one po TID prn Patient not taking: Reported on 05/01/2015 01/16/15   Lori P Hvozdovic, PA-C  ketorolac (TORADOL) 10 MG tablet Take 1 tablet (10 mg total) by mouth every 6 (six) hours as needed. 07/19/15   Isla Pence, MD  linaclotide [facility]) 145 MCG CAPS capsule TAKE ONE (1) CAPSULE EACH DAY 07/04/15   Irene Shipper, MD  Meth-Hyo-M Bl-Na Phos-Ph Sal (URIBEL) 118 MG CAPS Take 1 capsule by mouth  as needed.    Historical Provider, MD  Probiotic Product (ALIGN) 4 MG CAPS Take 1 capsule by mouth daily.    Historical Provider, MD  psyllium (METAMUCIL) 58.6 % packet Take 1 packet by mouth daily.    Historical Provider, MD  saccharomyces boulardii (FLORASTOR) 250 MG capsule Take 1 capsule (250 mg total) by mouth 2 (two) times daily. 05/01/15   Jessica D Zehr, PA-C  sucralfate (CARAFATE) 1 g tablet Take 1 tablet (1 g total) by mouth 4 (four) times daily -  with meals and at bedtime. 05/02/15   Jessica D Zehr, PA-C  sucralfate (CARAFATE) 1 GM/10ML suspension Take 10 mLs (1 g total) by mouth 4 (four) times daily -  with meals and at bedtime. 05/01/15   Janett Billow D Zehr, PA-C   BP 127/78 mmHg  Pulse 81  Temp(Src) 98.9 F (37.2 C) (Oral)  Resp 11   Ht 5' (1.524 m)  Wt 110 lb (49.896 kg)  BMI 21.48 kg/m2  SpO2 97% Physical Exam  Constitutional: She is oriented to person, place, and time. She appears well-developed and well-nourished.  HENT:  Head: Normocephalic and atraumatic.  Right Ear: External ear normal.  Left Ear: External ear normal.  Nose: Nose normal.  Mouth/Throat: Oropharynx is clear and moist.  Eyes: Conjunctivae and EOM are normal. Pupils are equal, round, and reactive to light.  Neck: Normal range of motion. Neck supple.  Cardiovascular: Normal rate, regular rhythm, normal heart sounds and intact distal pulses.   Pulmonary/Chest: Effort normal and breath sounds normal.  Abdominal: Soft. Bowel sounds are normal.  Genitourinary: Right adnexum displays no mass, no tenderness and no fullness. Left adnexum displays no mass, no tenderness and no fullness. Vaginal discharge found.  No shingles/herpes noted  Musculoskeletal: Normal range of motion.  Neurological: She is alert and oriented to person, place, and time.  Skin: Skin is warm and dry.  Psychiatric: She has a normal mood and affect. Her behavior is normal. Judgment and thought content normal.  Nursing note and vitals reviewed.   ED Course  Procedures (including critical care time) Labs Review Labs Reviewed  WET PREP, GENITAL - Abnormal; Notable for the following:    Yeast Wet Prep HPF POC PRESENT (*)    WBC, Wet Prep HPF POC FEW (*)    All other components within normal limits  CBC - Abnormal; Notable for the following:    Hemoglobin 15.4 (*)    All other components within normal limits  COMPREHENSIVE METABOLIC PANEL - Abnormal; Notable for the following:    Glucose, Bld 105 (*)    All other components within normal limits  URINALYSIS, ROUTINE W REFLEX MICROSCOPIC (NOT AT [facility]) - Abnormal; Notable for the following:    Specific Gravity, Urine <1.005 (*)    All other components within normal limits  TROPONIN I  LIPASE, BLOOD  D-DIMER, QUANTITATIVE  (NOT AT [facility])  GC/CHLAMYDIA PROBE AMP ([place]) NOT AT [facility]    Imaging Review Dg Chest 2 View  07/19/2015  CLINICAL DATA:  60 year old female with a history of chest pain EXAM: CHEST  2 VIEW COMPARISON:  09/11/2013 FINDINGS: Cardiomediastinal silhouette unchanged. Calcifications of the aortic arch. No pneumothorax or pleural effusion.  No confluent airspace disease. Stigmata of emphysema, with increased retrosternal airspace, flattened hemidiaphragms, increased AP diameter, and hyperinflation on the AP view. IMPRESSION: Background of emphysema without evidence of superimposed acute cardiopulmonary disease. Aortic atherosclerosis Signed, Dulcy Fanny. Earleen Newport, DO [facility] Radiology Electronically Signed  By: Corrie Mckusick D.O.   On: 07/19/2015 13:23   I have personally reviewed and evaluated these images and lab results as part of my medical decision-making.   EKG Interpretation   Date/Time:  Saturday July 19 2015 12:09:26 EDT Ventricular Rate:  96 PR Interval:    QRS Duration: 88 QT Interval:  346 QTC Calculation: 438 R Axis:   52 Text Interpretation:  Sinus rhythm Short PR interval Low voltage,  precordial leads Abnormal R-wave progression, early transition Borderline  repolarization abnormality Baseline wander in lead(s) I II aVR V2  Confirmed by Gilles Trimpe MD, Teresia Myint ([id number]) on 07/19/2015 12:19:39 PM      MDM  Labs are ok.  Pain is atypical for cardiac and has been going on for 1 week.  The pt knows to return if worse.  She knows to f/u with pcp.  Final diagnoses:  Vaginal candidiasis  Atypical chest pain       Isla Pence, MD 07/19/15 1537

## 2015-07-19 NOTE — ED Notes (Signed)
EDP at bedside  

## 2015-07-19 NOTE — ED Notes (Signed)
Patient c/o central chest pain with no radiataion intermittently x1 week. Denies any shortness of breath or cardiac hx. Per patient some nausea. Patient recently treated for shingles.

## 2015-07-19 NOTE — ED Notes (Signed)
Pt back from xray. Pt ambulated to bathroom with steady gait.

## 2015-07-21 ENCOUNTER — Telehealth: Payer: Self-pay | Admitting: Internal Medicine

## 2015-07-21 DIAGNOSIS — B379 Candidiasis, unspecified: Secondary | ICD-10-CM | POA: Diagnosis not present

## 2015-07-21 LAB — GC/CHLAMYDIA PROBE AMP (~~LOC~~) NOT AT ARMC
CHLAMYDIA, DNA PROBE: NEGATIVE
NEISSERIA GONORRHEA: NEGATIVE

## 2015-07-21 NOTE — Telephone Encounter (Signed)
Pt states she was seen in the ER this weekend for chest pain. Cardiac causes were ruled out and pt states she saw her PCP this morning. States PCP told her it could be her acid reflux. Pt is taking her dexilant as prescribed and added gasx and mylanta. Pt requests to be seen. Pt scheduled to see [name] Esterwood PA 07/24/15@2 :30pm. Pt aware of appt.

## 2015-07-24 ENCOUNTER — Ambulatory Visit (INDEPENDENT_AMBULATORY_CARE_PROVIDER_SITE_OTHER): Payer: BLUE CROSS/BLUE SHIELD | Admitting: Physician Assistant

## 2015-07-24 ENCOUNTER — Encounter: Payer: Self-pay | Admitting: Physician Assistant

## 2015-07-24 VITALS — BP 118/50 | HR 112 | Ht 60.0 in | Wt 112.1 lb

## 2015-07-24 DIAGNOSIS — K5900 Constipation, unspecified: Secondary | ICD-10-CM | POA: Diagnosis not present

## 2015-07-24 DIAGNOSIS — R140 Abdominal distension (gaseous): Secondary | ICD-10-CM | POA: Diagnosis not present

## 2015-07-24 DIAGNOSIS — K5909 Other constipation: Secondary | ICD-10-CM

## 2015-07-24 DIAGNOSIS — K2100 Gastro-esophageal reflux disease with esophagitis, without bleeding: Secondary | ICD-10-CM

## 2015-07-24 DIAGNOSIS — K3184 Gastroparesis: Secondary | ICD-10-CM | POA: Diagnosis not present

## 2015-07-24 DIAGNOSIS — K589 Irritable bowel syndrome without diarrhea: Secondary | ICD-10-CM | POA: Diagnosis not present

## 2015-07-24 DIAGNOSIS — R0789 Other chest pain: Secondary | ICD-10-CM

## 2015-07-24 MED ORDER — SUCRALFATE 1 G PO TABS: 1.0000 g | ORAL_TABLET | Freq: Three times a day (TID) | ORAL | Status: DC

## 2015-07-24 MED ORDER — SUCRALFATE 1 GM/10ML PO SUSP: 1.0000 g | Freq: Three times a day (TID) | ORAL | Status: DC

## 2015-07-24 NOTE — Progress Notes (Signed)
Very functional patient with innumerable calls and office evaluations in the past few years. Note reviewed. Agree with assessment and plans

## 2015-07-24 NOTE — Progress Notes (Signed)
Patient ID: Ruth Terry, female   DOB: 11-15-1955, 60 y.o.   MRN: [id number]   Subjective:    Patient ID: Ruth Terry, female    DOB: 1955-11-21, 60 y.o.   MRN: [id number]  HPI Ruth Terry is a pleasant 60 year old white female known to Dr. Yancey Flemings who has history of chronic GERD, IBS/constipation predominant, gastroparesis, hemorrhoids, diverticulosis and hyperlipidemia. She comes in today after recent ER visit on 07/19/2015 with complaints of chest pain which at that point had been going on for about a week. Workup was negative for cardiac disease, chest x-ray showed mild COPD and aortic calcifications. She subsequently saw her PCP and was told that perhaps her chest pain was coming from reflux. Patient tells me that she developed shingles in May 2017 on her inner thigh and then later that month was treated for shingles of her right eye. During that time she took, Penton and valacyclovir and eventually symptoms resolved. She did okay until around July 12 when she developed some skin lesions on her other inner thigh he was again treated with valacyclovir. The couple of days later she started developing some chest pressure bilaterally in her upper chest would, and go and some increased gas and belching. She did not have any dysphagia or odynophagia and has been taking Dexalone 60 mg chronically. Since that time symptoms have not abated but also have not worsened. She also has her usual complaints of abdominal bloating gas incomplete bowel evacuation. She tells me that she has usually 2 good bowel movements every day on her current regimen of MiraLAX , Linzess, Metamucil and domperidone, but asks if she needs something else for constipation.  Review of Systems Pertinent positive and negative review of systems were noted in the above HPI section.  All other review of systems was otherwise negative.  Outpatient Encounter Prescriptions as of 07/24/2015  Medication Sig  . AMBULATORY NON FORMULARY  MEDICATION Domperidone 10mg  take one at bedtime  . Calcium & Magnesium Carbonates (MYLANTA PO) Take 1 Dose by mouth as needed.  . Cholecalciferol (VITAMIN D3) 5000 UNITS CAPS Take 1 capsule by mouth daily.  Marland Kitchen dexlansoprazole (DEXILANT) 60 MG capsule Take 1 capsule (60 mg total) by mouth daily.  Marland Kitchen dicyclomine (BENTYL) 10 MG capsule Take one po TID prn (Patient taking differently: Take 10 mg by mouth as needed. )  . fluconazole (DIFLUCAN) 100 MG tablet Take 100 mg by mouth daily.  Marland Kitchen gabapentin (NEURONTIN) 300 MG capsule Take 300 mg by mouth as needed.  Marland Kitchen ketorolac (TORADOL) 10 MG tablet Take 1 tablet (10 mg total) by mouth every 6 (six) hours as needed.  . linaclotide (LINZESS) 145 MCG CAPS capsule TAKE ONE (1) CAPSULE EACH DAY  . Meth-Hyo-M Bl-Na Phos-Ph Sal (URIBEL) 118 MG CAPS Take 1 capsule by mouth as needed.  . polyethylene glycol powder (GLYCOLAX/MIRALAX) powder Take 17 g by mouth daily.  . Probiotic Product (ALIGN) 4 MG CAPS Take 1 capsule by mouth daily.  . psyllium (METAMUCIL) 58.6 % packet Take 1 packet by mouth daily.  . Simethicone (PHAZYME PO) Take 1 tablet by mouth daily.  . sucralfate (CARAFATE) 1 g tablet Take 1 tablet (1 g total) by mouth 4 (four) times daily -  with meals and at bedtime. Crush tablet and add water to make a slurry, and swallow.  . sucralfate (CARAFATE) 1 GM/10ML suspension Take 10 mLs (1 g total) by mouth 4 (four) times daily -  with meals and at bedtime.  . [DISCONTINUED]  bisacodyl (DULCOLAX) 5 MG EC tablet Take 5 mg by mouth daily as needed for moderate constipation.  . [DISCONTINUED] saccharomyces boulardii (FLORASTOR) 250 MG capsule Take 1 capsule (250 mg total) by mouth 2 (two) times daily.  . [DISCONTINUED] sucralfate (CARAFATE) 1 g tablet Take 1 tablet (1 g total) by mouth 4 (four) times daily -  with meals and at bedtime.  . [DISCONTINUED] sucralfate (CARAFATE) 1 GM/10ML suspension Take 10 mLs (1 g total) by mouth 4 (four) times daily -  with meals and at  bedtime.   No facility-administered encounter medications on file as of 07/24/2015.   Allergies  Allergen Reactions  . Chlordiazepoxide-Clidinium   . Ciprofloxacin   . Flagyl [Metronidazole]     Severe yeast infection  . Sulfa Drugs Cross Reactors Nausea Only  . Nitrofurantoin Monohyd Macro Rash   Patient Active Problem List   Diagnosis Date Noted  . Internal hemorrhoids 05/14/2014  . GERD (gastroesophageal reflux disease) 09/17/2011  . Abdominal pain, epigastric 12/08/2010  . Tobacco abuse 09/09/2010  . Stress 08/25/2010  . IBS (irritable bowel syndrome) 08/25/2010  . Chest pain, atypical 08/25/2010  . History of IBS 07/28/2010  . Gastroparesis 06/30/2010  . Drug reaction 06/30/2010  . Irritable bowel syndrome 04/03/2010  . Non-ulcer dyspepsia 04/03/2010   Social History   Social History  . Marital Status: Married    Spouse Name: N/A  . Number of Children: 0  . Years of Education: N/A   Occupational History  . [employer] features editor school program  .     Social History Main Topics  . Smoking status: Former Smoker -- 0.50 packs/day for 6 years    Types: Cigarettes  . Smokeless tobacco: Never Used     Comment: USE SMOKELESS CIGARETTES  . Alcohol Use: 0.0 oz/week    0 Standard drinks or equivalent per week     Comment: 1 a week  . Drug Use: No  . Sexual Activity: Not on file   Other Topics Concern  . Not on file   Social History Narrative    Ms. Forgione's family history includes Diabetes in her father; Heart attack in her father; Hypertension in her mother.      Objective:    Filed Vitals:   07/24/15 1413  BP: 118/50  Pulse: 112    Physical Exam well-developed older white female in no acute distress, pleasant blood pressure 118/50 pulse 112 height 5 foot weight 112 BMI 21. HEENT nontraumatic normocephalic EOMI PERRLA sclera anicteric, HEENT nontraumatic normocephalic EOMI PERRLA sclera anicteric, Pulmonary clear bilaterally, there is  no tenderness to palpation of the chest wall  Abdomen soft, no focal tenderness no guarding or rebound no palpable mass or hepatosplenomegaly bowel sounds are present, Rectal exam not done, Extremities no clubbing cyanosis or edema skin warm and dry, Neuropsych mood and affect appropriate     Assessment & Plan:   #20 60 year old white female with 1 week history of bilateral upper chest pressure intermittently associated with increased gas and belching. Patient has chronic GERD and is on Dexilant-And is possible symptoms are related to exacerbation of GERD or mild esophagitis. #2 IBS constipation predominant #3 gastroparesis #4 chronic gas and bloating  Plan; continue current bowel regimen Continue domperidone Add Phazyme with meals We'll give a two-week course of Carafate liquid 1 g between meals and at bedtime She will follow up with Dr. Marina Goodell as needed.  Mateen Franssen S Maeryn Mcgath PA-C 07/24/2015   Cc: Samuel Jester,  DO

## 2015-07-24 NOTE — Patient Instructions (Signed)
We sent a prescription to [facility], in [place].  Carafate liquid, Take 1 gram between meals and at bedtime for 2 weeks then as needed.  Take Phazyme with meals.

## 2015-08-13 ENCOUNTER — Telehealth: Payer: Self-pay | Admitting: Internal Medicine

## 2015-08-13 DIAGNOSIS — K600 Acute anal fissure: Secondary | ICD-10-CM | POA: Diagnosis not present

## 2015-08-13 DIAGNOSIS — Z6821 Body mass index (BMI) 21.0-21.9, adult: Secondary | ICD-10-CM | POA: Diagnosis not present

## 2015-08-13 NOTE — Telephone Encounter (Signed)
Pt was concerned that she was given nitro to treat the fissure. Discussed that fissures can be treated with this medication. Pt will start the med but states if she is not better by Monday she will call back.

## 2015-08-15 ENCOUNTER — Telehealth: Payer: Self-pay | Admitting: Internal Medicine

## 2015-08-15 NOTE — Telephone Encounter (Signed)
Patient states she saw her PCP and was given Nitroglycerin ointment for a fissure. It gave her a terrible headache. She states Dr. Sharlett Iles gave her Diltiazem gel before for this and it worked. She is asking for a prescription for this. DOD- Dr. Ardis Hughs. Please, advise.

## 2015-08-18 NOTE — Telephone Encounter (Signed)
Pt asking to speak with Ruth Terry about her med

## 2015-08-19 ENCOUNTER — Telehealth: Payer: Self-pay | Admitting: Internal Medicine

## 2015-08-19 MED ORDER — DILTIAZEM GEL 2 %: CUTANEOUS | 1 refills | Status: DC

## 2015-08-19 MED ORDER — HYDROCORTISONE ACETATE 25 MG RE SUPP: 25.0000 mg | Freq: Two times a day (BID) | RECTAL | 0 refills | Status: DC

## 2015-08-19 NOTE — Telephone Encounter (Signed)
Diltiazem gel. 2%. Apply to affected area 5 times daily. Refills as needed

## 2015-08-19 NOTE — Telephone Encounter (Signed)
Pt was seen by her PCP last week and diagnosed with a fissure. PCP gave her nitrogylcerin gel to treat the fissure and she states that it gave her a terrible headache. Pt states Dr. Sharlett Iles gave her diltiazem gel in the past and she would like to have a prescription for this. Dr. Henrene Pastor please advise if ok to send script in for diltiazem gel.

## 2015-08-19 NOTE — Telephone Encounter (Signed)
Pt states she also needs something for hemorrhoids. Scripts sent in for diltiazem gel and anusol suppositories. Pt aware.

## 2015-08-21 NOTE — Telephone Encounter (Signed)
Handled by Rosanne Sack

## 2015-08-25 DIAGNOSIS — K6289 Other specified diseases of anus and rectum: Secondary | ICD-10-CM | POA: Diagnosis not present

## 2015-08-25 DIAGNOSIS — N763 Subacute and chronic vulvitis: Secondary | ICD-10-CM | POA: Diagnosis not present

## 2015-08-25 DIAGNOSIS — Z6821 Body mass index (BMI) 21.0-21.9, adult: Secondary | ICD-10-CM | POA: Diagnosis not present

## 2015-09-21 ENCOUNTER — Emergency Department (HOSPITAL_COMMUNITY)
Admission: EM | Admit: 2015-09-21 | Discharge: 2015-09-21 | Disposition: A | Discharge status: Home / Self Care | Payer: BLUE CROSS/BLUE SHIELD | Attending: Emergency Medicine | Admitting: Emergency Medicine

## 2015-09-21 ENCOUNTER — Emergency Department (HOSPITAL_COMMUNITY): Payer: BLUE CROSS/BLUE SHIELD

## 2015-09-21 ENCOUNTER — Encounter (HOSPITAL_COMMUNITY): Payer: Self-pay | Admitting: Emergency Medicine

## 2015-09-21 DIAGNOSIS — Z79899 Other long term (current) drug therapy: Secondary | ICD-10-CM | POA: Insufficient documentation

## 2015-09-21 DIAGNOSIS — Z87891 Personal history of nicotine dependence: Secondary | ICD-10-CM | POA: Diagnosis not present

## 2015-09-21 DIAGNOSIS — R109 Unspecified abdominal pain: Secondary | ICD-10-CM | POA: Diagnosis not present

## 2015-09-21 LAB — BASIC METABOLIC PANEL
Anion gap: 9 (ref 5–15)
BUN: 8 mg/dL (ref 6–20)
CALCIUM: 9.4 mg/dL (ref 8.9–10.3)
CHLORIDE: 107 mmol/L (ref 101–111)
CO2: 26 mmol/L (ref 22–32)
CREATININE: 0.76 mg/dL (ref 0.44–1.00)
Glucose, Bld: 138 mg/dL — ABNORMAL HIGH (ref 65–99)
Potassium: 3.7 mmol/L (ref 3.5–5.1)
SODIUM: 142 mmol/L (ref 135–145)

## 2015-09-21 LAB — URINALYSIS, ROUTINE W REFLEX MICROSCOPIC
Bilirubin Urine: NEGATIVE
GLUCOSE, UA: NEGATIVE mg/dL
Hgb urine dipstick: NEGATIVE
KETONES UR: NEGATIVE mg/dL
LEUKOCYTES UA: NEGATIVE
Nitrite: NEGATIVE
PROTEIN: NEGATIVE mg/dL
Specific Gravity, Urine: <1.005 — ABNORMAL LOW (ref 1.005–1.030)
pH: 6 (ref 5.0–8.0)

## 2015-09-21 LAB — CBC
HCT: 42.1 % (ref 36.0–46.0)
HEMOGLOBIN: 14.5 g/dL (ref 12.0–15.0)
MCH: 31.5 pg (ref 26.0–34.0)
MCHC: 34.4 g/dL (ref 30.0–36.0)
MCV: 91.5 fL (ref 78.0–100.0)
PLATELETS: 207 10*3/uL (ref 150–400)
RBC: 4.6 MIL/uL (ref 3.87–5.11)
RDW: 13.9 % (ref 11.5–15.5)
WBC: 5 10*3/uL (ref 4.0–10.5)

## 2015-09-21 IMAGING — CT CT RENAL STONE PROTOCOL
2 of 4 series · 16 of 46 positions shown, 18 images · non-contrast
Comparison: [DATE]

CLINICAL DATA: RIGHT flank pain beginning [REDACTED], dull achy
feeling that feels like a band around her abdomen and back,
BILATERAL hip pain, history diverticulosis, former smoker

EXAM:
CT ABDOMEN AND PELVIS WITHOUT CONTRAST
TECHNIQUE: Multidetector CT imaging of the abdomen and pelvis was performed
following the standard protocol without IV contrast. Oral contrast
not administered for this indication. Sagittal and coronal MPR
images reconstructed from axial data set.

[Series 2: routine abd pel with · axial · 0.59mm/px · z∈[-422,-27]mm · 13 of 87 slices shown, 15 images]
[im 4/87  soft-tissue]
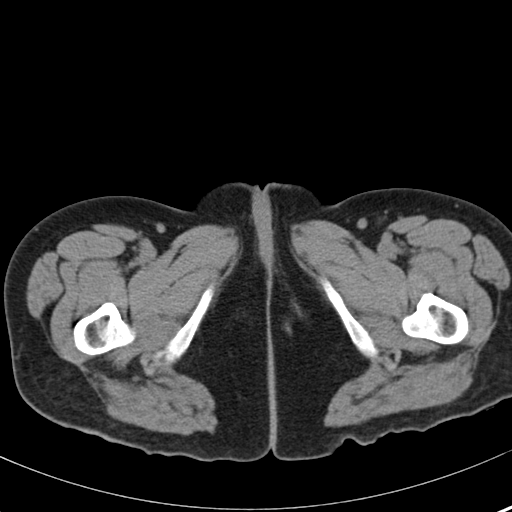
[im 4/87  bone]
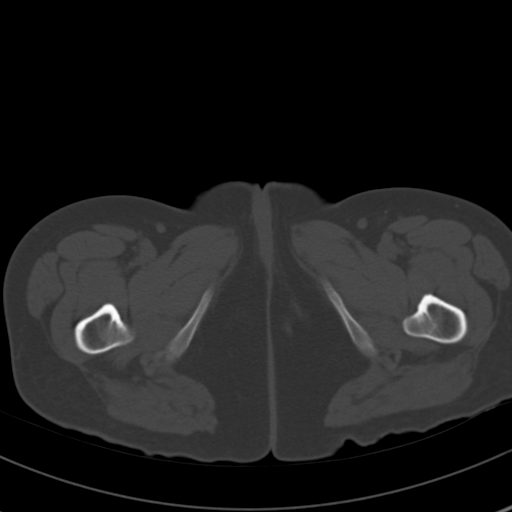
[im 11/87  soft-tissue]
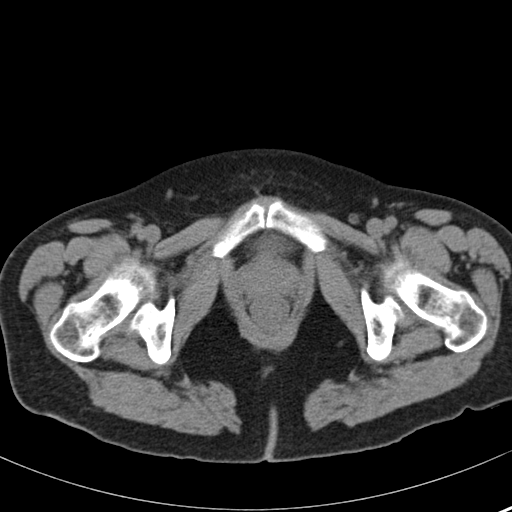
[im 18/87  soft-tissue]
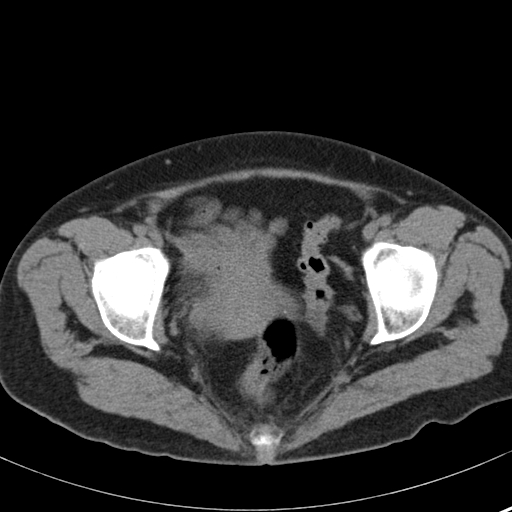
[im 26/87  soft-tissue]
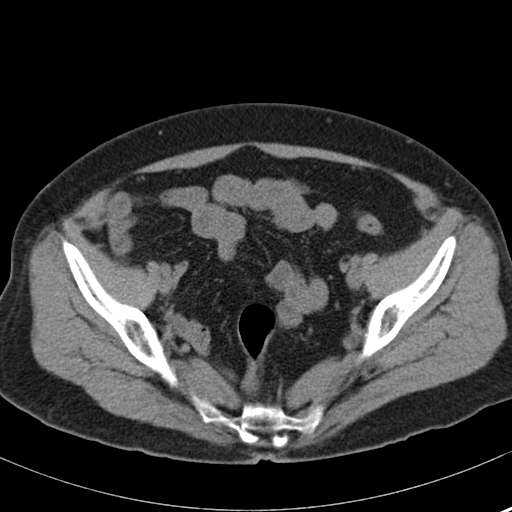
[im 29/87  soft-tissue]
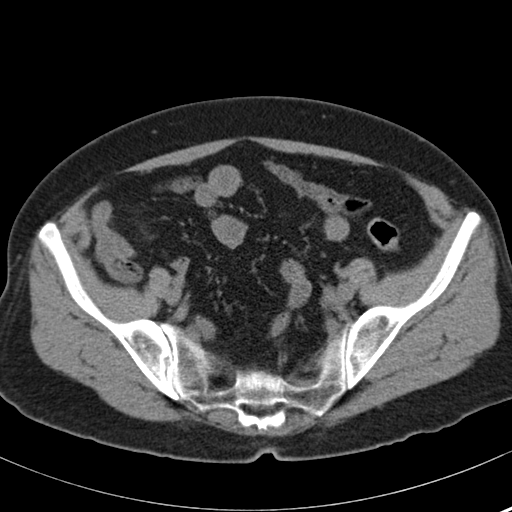
[im 36/87  soft-tissue]
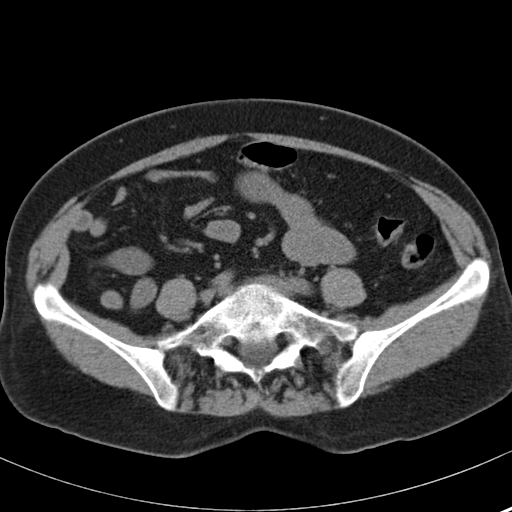
[im 44/87  soft-tissue]
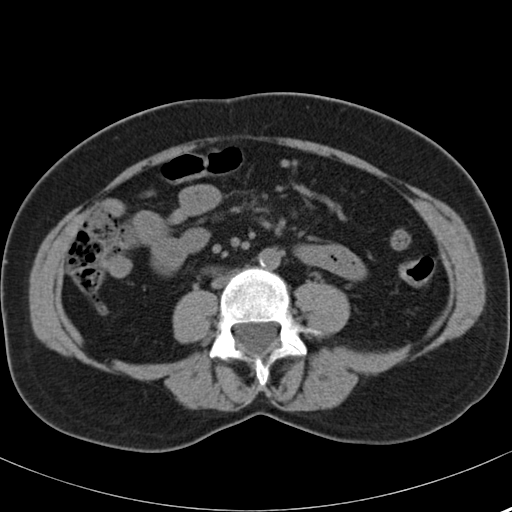
[im 51/87  soft-tissue]
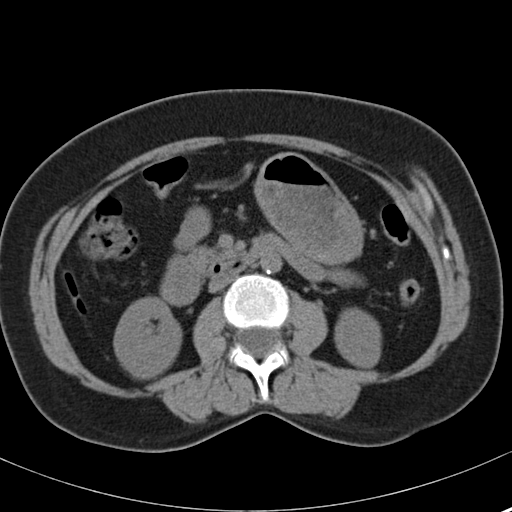
[im 58/87  soft-tissue]
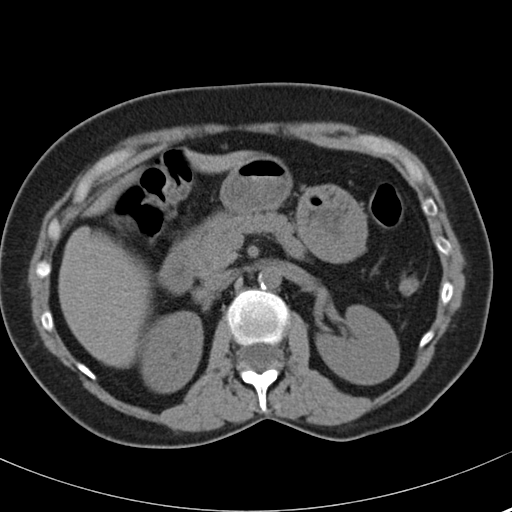
[im 58/87  bone]
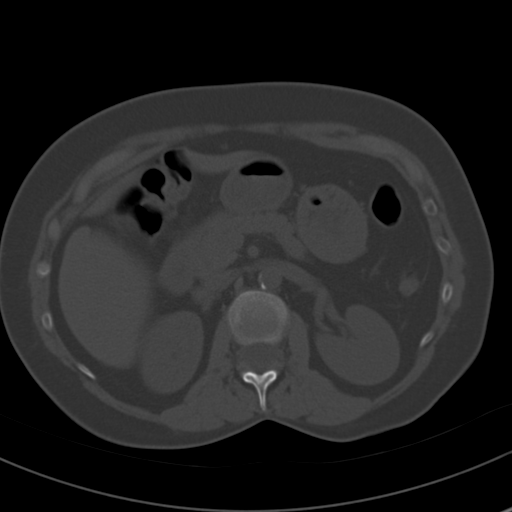
[im 61/87  soft-tissue]
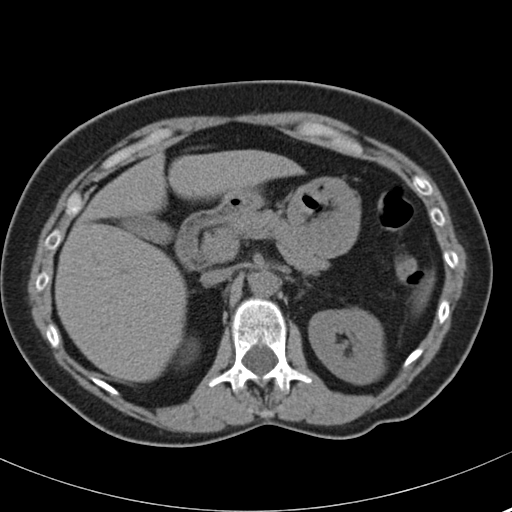
[im 69/87  soft-tissue]
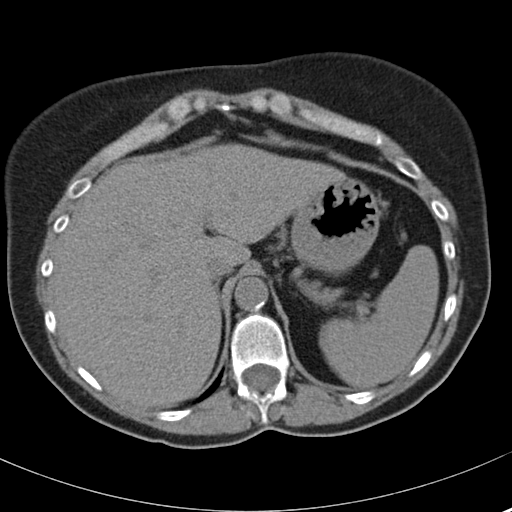
[im 76/87  soft-tissue]
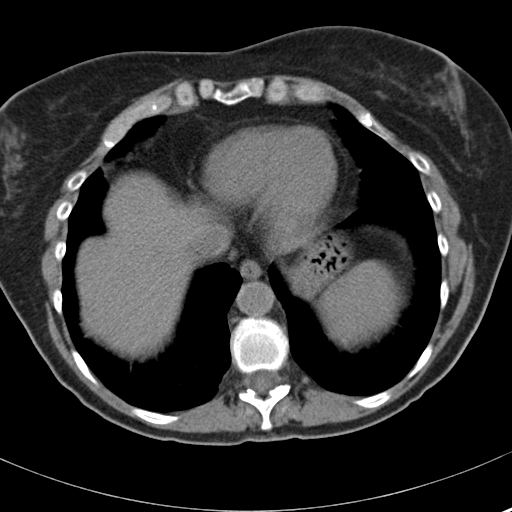
[im 83/87  soft-tissue]
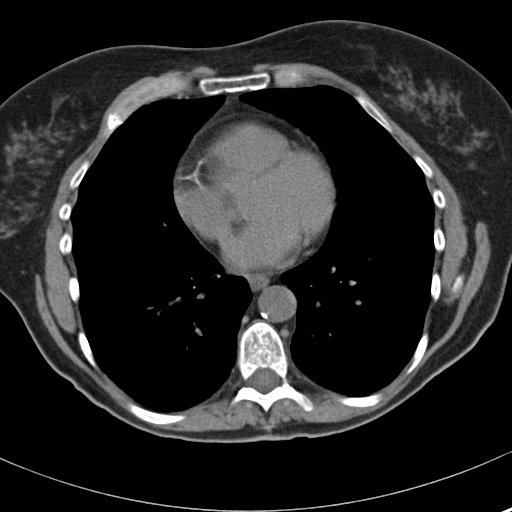

[Series 4: coronal · coronal · 0.61mm/px · 3 of 107 slices shown]
[im 36/107  soft-tissue]
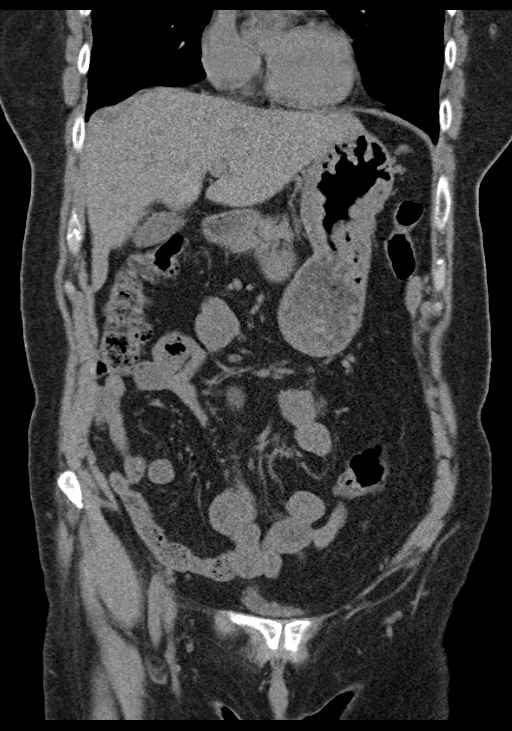
[im 48/107  soft-tissue]
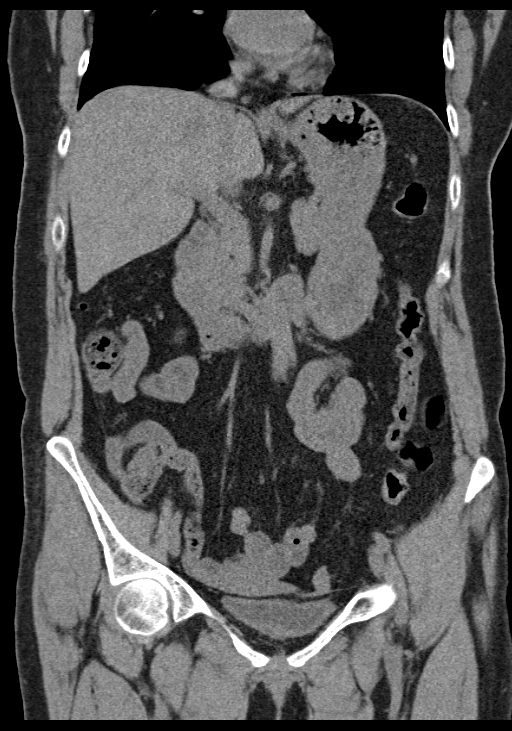
[im 59/107  soft-tissue]
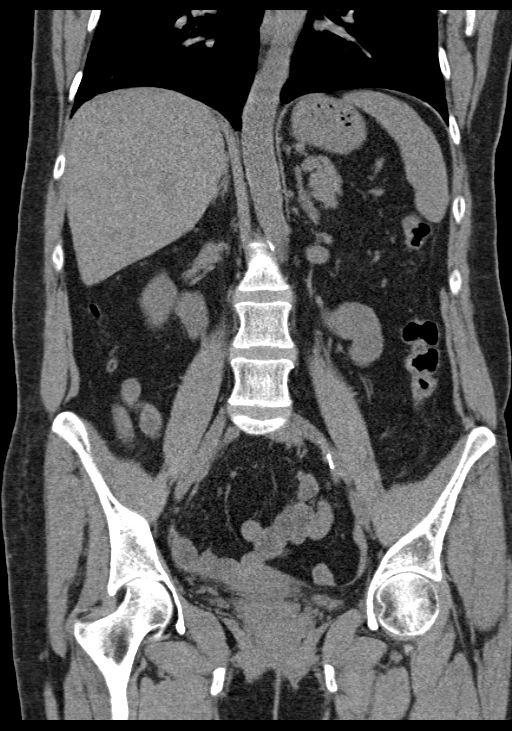

[16 of 46 positions shown; findings below may reference images not displayed]

FINDINGS: Lower chest: Dependent bibasilar atelectasis

Hepatobiliary: Liver and gallbladder normal appearance

Pancreas: Normal appearance

Spleen: Normal appearance

Adrenals/Urinary Tract: Adrenal glands normal appearance. Kidneys,
ureters, and bladder normal appearance

Stomach/Bowel: Normal appendix. Stomach and bowel loops normal
appearance for technique.

Vascular/Lymphatic: Atherosclerotic calcification aorta and iliac
arteries. Aorta normal caliber. No adenopathy

Reproductive: Unremarkable uterus and adnexa

Other: No free air or free fluid. Tiny umbilical hernia containing
fat.

Musculoskeletal: Bones demineralized. BILATERAL hip joint space
likely mild degenerative changes question
IMPRESSION: Tiny umbilical hernia containing fat.

Otherwise negative exam.

## 2015-09-21 MED ORDER — NAPROXEN 500 MG PO TABS: 500.0000 mg | ORAL_TABLET | Freq: Two times a day (BID) | ORAL | 0 refills | Status: DC

## 2015-09-21 NOTE — ED Triage Notes (Signed)
Patient c/o pain that started in right flank on Wednesday. Per patient pain now "feels like it is a band around abd and back." Per patient dull aching feeling. Per patient pain also in hips bilateral. Denies any nausea, vomiting, diarrhea, or urinary symptoms. Per patient unsure of fevers but has had some chills.

## 2015-09-21 NOTE — ED Notes (Signed)
Pt with bilateral flank pain worse on right and wraps around front.  LBM this morning and normal for pt. Denies blood in stool

## 2015-09-21 NOTE — ED Notes (Signed)
Pt has returned from CT.  

## 2015-09-21 NOTE — Discharge Instructions (Signed)
Naproxen as prescribed.  Tylenol 1000 mg every 6 hours as needed for pain not relieved with naproxen.  Follow-up with your primary Dr. if not improving in the next week, and return to the ER if your symptoms significantly worsen or change.

## 2015-09-21 NOTE — ED Provider Notes (Signed)
[facility] DEPT Provider Note   CSN: [id number] Arrival date & time: 09/21/15  1307     History   Chief Complaint Chief Complaint  Patient presents with  . Flank Pain    HPI Ruth Terry is a 60 y.o. female.  Patient is a 60 year old female with past medical history of irritable bowel, GERD. She presents for evaluation of right flank pain that started several days ago. Her pain occurs intermittently. She denies any injury or trauma. She denies any fevers, chills, bowel, or bladder complaints.      Past Medical History:  Diagnosis Date  . Anal fissure   . Chronic constipation   . Diverticulosis 04-20-10   colonoscopy  . Gastroparesis   . GERD (gastroesophageal reflux disease)   . Hemorrhoids   . Hyperlipidemia   . IBS (irritable bowel syndrome)   . MVP (mitral valve prolapse)   . Polyp, stomach 04-20-10   egd  . Shingles   . UTI (lower urinary tract infection)     Patient Active Problem List   Diagnosis Date Noted  . Internal hemorrhoids 05/14/2014  . GERD (gastroesophageal reflux disease) 09/17/2011  . Abdominal pain, epigastric 12/08/2010  . Tobacco abuse 09/09/2010  . Stress 08/25/2010  . IBS (irritable bowel syndrome) 08/25/2010  . Chest pain, atypical 08/25/2010  . History of IBS 07/28/2010  . Gastroparesis 06/30/2010  . Drug reaction 06/30/2010  . Irritable bowel syndrome 04/03/2010  . Non-ulcer dyspepsia 04/03/2010    History reviewed. No pertinent surgical history.  OB History    Gravida Para Term Preterm AB Living   0 0 0 0 0 0   SAB TAB Ectopic Multiple Live Births   0 0 0 0 0       Home Medications    Prior to Admission medications   Medication Sig Start Date End Date Taking? Authorizing Provider  AMBULATORY NON FORMULARY MEDICATION Domperidone 10mg  take one at bedtime 12/03/14  Yes Irene Shipper, MD  dexlansoprazole (DEXILANT) 60 MG capsule Take 1 capsule (60 mg total) by mouth daily. 07/04/15  Yes Irene Shipper, MD  diltiazem 2  % GEL Apply pea sized amount to rectum 5 times daily 08/19/15  Yes Irene Shipper, MD  linaclotide [facility]) 145 MCG CAPS capsule TAKE ONE (1) CAPSULE EACH DAY 07/04/15  Yes Irene Shipper, MD  polyethylene glycol powder (GLYCOLAX/MIRALAX) powder Take 17 g by mouth daily.   Yes Historical Provider, MD  Probiotic Product (ALIGN) 4 MG CAPS Take 1 capsule by mouth daily.   Yes Historical Provider, MD  psyllium (METAMUCIL) 58.6 % packet Take 1 packet by mouth daily.   Yes Historical Provider, MD  valACYclovir (VALTREX) 1000 MG tablet Take 1,000 mg by mouth at bedtime.  08/25/15  Yes Historical Provider, MD  Calcium & Magnesium Carbonates (MYLANTA PO) Take 1 Dose by mouth as needed. indegestion    Historical Provider, MD  dicyclomine (BENTYL) 10 MG capsule Take one po TID prn Patient not taking: Reported on 09/21/2015 01/16/15   Lori P Hvozdovic, PA-C  gabapentin (NEURONTIN) 300 MG capsule Take 300 mg by mouth as needed. pain    Historical Provider, MD  hydrocortisone (ANUSOL-HC) 25 MG suppository Place 1 suppository (25 mg total) rectally 2 (two) times daily. Patient not taking: Reported on 09/21/2015 08/19/15   Irene Shipper, MD  ketorolac (TORADOL) 10 MG tablet Take 1 tablet (10 mg total) by mouth every 6 (six) hours as needed. Patient not taking: Reported on 09/21/2015 07/19/15  Isla Pence, MD  Meth-Hyo-M Barnett Hatter Phos-Ph Sal (URIBEL) 118 MG CAPS Take 1 capsule by mouth as needed.    Historical Provider, MD  Simethicone (PHAZYME PO) Take 1 tablet by mouth daily.    Historical Provider, MD  sucralfate (CARAFATE) 1 g tablet Take 1 tablet (1 g total) by mouth 4 (four) times daily -  with meals and at bedtime. Crush tablet and add water to make a slurry, and swallow. 07/24/15   [name] S Esterwood, PA-C  sucralfate (CARAFATE) 1 GM/10ML suspension Take 10 mLs (1 g total) by mouth 4 (four) times daily -  with meals and at bedtime. Patient not taking: Reported on 09/21/2015 07/24/15   [name] S Esterwood, PA-C    Family  History Family History  Problem Relation Age of Onset  . Hypertension Mother   . Diabetes Father   . Heart attack Father     Age 79    Social History Social History  Substance Use Topics  . Smoking status: Former Smoker    Packs/day: 0.50    Years: 6.00    Types: Cigarettes  . Smokeless tobacco: Never Used     Comment: USE SMOKELESS CIGARETTES  . Alcohol use 0.0 oz/week     Comment: 1 a week     Allergies   Chlordiazepoxide-clidinium; Ciprofloxacin; Flagyl [metronidazole]; Sulfa drugs cross reactors; and Nitrofurantoin monohyd macro   Review of Systems Review of Systems  All other systems reviewed and are negative.    Physical Exam Updated Vital Signs BP 145/68 (BP Location: Left Arm)   Pulse 91   Temp 97.3 F (36.3 C) (Oral)   Resp 18   Ht 5' (1.524 m)   Wt 115 lb (52.2 kg)   SpO2 100%   BMI 22.46 kg/m   Physical Exam  Constitutional: She is oriented to person, place, and time. She appears well-developed and well-nourished. No distress.  HENT:  Head: Normocephalic and atraumatic.  Neck: Normal range of motion. Neck supple.  Cardiovascular: Normal rate and regular rhythm.  Exam reveals no gallop and no friction rub.   No murmur heard. Pulmonary/Chest: Effort normal and breath sounds normal. No respiratory distress. She has no wheezes.  Abdominal: Soft. Bowel sounds are normal. She exhibits no distension. There is no tenderness.  There is mild right-sided CVA tenderness.  Musculoskeletal: Normal range of motion.  Neurological: She is alert and oriented to person, place, and time.  Skin: Skin is warm and dry. She is not diaphoretic.  Nursing note and vitals reviewed.    ED Treatments / Results  Labs (all labs ordered are listed, but only abnormal results are displayed) Labs Reviewed  URINALYSIS, ROUTINE W REFLEX MICROSCOPIC (NOT AT [facility]) - Abnormal; Notable for the following:       Result Value   Specific Gravity, Urine <1.005 (*)    All other  components within normal limits  BASIC METABOLIC PANEL - Abnormal; Notable for the following:    Glucose, Bld 138 (*)    All other components within normal limits  CBC    EKG  EKG Interpretation None       Radiology No results found.  Procedures Procedures (including critical care time)  Medications Ordered in ED Medications - No data to display   Initial Impression / Assessment and Plan / ED Course  I have reviewed the triage vital signs and the nursing notes.  Pertinent labs & imaging results that were available during my care of the patient were reviewed by me  and considered in my medical decision making (see chart for details).  Clinical Course    Patient presents here with complaints of bilateral flank pain, right greater than left. Her urinalysis does not reveal infection or blood and CT is negative for renal calculus or other intra-abdominal pathology that would explain her pain. She will be discharged with anti-inflammatories and when necessary return.  Final Clinical Impressions(s) / ED Diagnoses   Final diagnoses:  None    New Prescriptions New Prescriptions   No medications on file     Veryl Speak, MD 09/21/15 1657

## 2015-09-21 NOTE — ED Notes (Signed)
Patient transported to CT 

## 2015-10-03 ENCOUNTER — Telehealth: Payer: Self-pay | Admitting: Internal Medicine

## 2015-10-07 MED ORDER — AMBULATORY NON FORMULARY MEDICATION: 3 refills | Status: DC

## 2015-10-07 NOTE — Telephone Encounter (Signed)
Faxed Domperidone rx to [place] pharmacy online

## 2015-10-17 ENCOUNTER — Ambulatory Visit: Payer: Self-pay | Admitting: Family Medicine

## 2015-10-17 DIAGNOSIS — J069 Acute upper respiratory infection, unspecified: Secondary | ICD-10-CM | POA: Diagnosis not present

## 2015-10-17 DIAGNOSIS — J40 Bronchitis, not specified as acute or chronic: Secondary | ICD-10-CM | POA: Diagnosis not present

## 2015-10-24 ENCOUNTER — Ambulatory Visit (INDEPENDENT_AMBULATORY_CARE_PROVIDER_SITE_OTHER): Payer: BLUE CROSS/BLUE SHIELD | Admitting: Physician Assistant

## 2015-10-24 VITALS — BP 127/83 | HR 76 | Temp 97.6°F | Ht 60.0 in | Wt 109.2 lb

## 2015-10-24 DIAGNOSIS — K580 Irritable bowel syndrome with diarrhea: Secondary | ICD-10-CM | POA: Diagnosis not present

## 2015-10-24 DIAGNOSIS — K3184 Gastroparesis: Secondary | ICD-10-CM | POA: Diagnosis not present

## 2015-10-24 DIAGNOSIS — E7800 Pure hypercholesterolemia, unspecified: Secondary | ICD-10-CM | POA: Diagnosis not present

## 2015-10-24 DIAGNOSIS — R739 Hyperglycemia, unspecified: Secondary | ICD-10-CM

## 2015-10-24 DIAGNOSIS — Z720 Tobacco use: Secondary | ICD-10-CM | POA: Diagnosis not present

## 2015-10-24 DIAGNOSIS — K219 Gastro-esophageal reflux disease without esophagitis: Secondary | ICD-10-CM | POA: Diagnosis not present

## 2015-10-24 DIAGNOSIS — J209 Acute bronchitis, unspecified: Secondary | ICD-10-CM

## 2015-10-24 MED ORDER — AZITHROMYCIN 250 MG PO TABS: ORAL_TABLET | ORAL | 0 refills | Status: DC

## 2015-10-24 MED ORDER — BENZONATATE 200 MG PO CAPS: 200.0000 mg | ORAL_CAPSULE | Freq: Two times a day (BID) | ORAL | 0 refills | Status: DC | PRN

## 2015-10-24 NOTE — Patient Instructions (Signed)

## 2015-10-27 ENCOUNTER — Encounter: Payer: Self-pay | Admitting: Physician Assistant

## 2015-10-27 NOTE — Progress Notes (Signed)
BP 127/83   Pulse 76   Temp 97.6 F (36.4 C) (Oral)   Ht 5' (1.524 m)   Wt 109 lb 3.2 oz (49.5 kg)   BMI 21.33 kg/m    Subjective:    Patient ID: Ruth Terry, female    DOB: [birth year], 60 y.o.   MRN: [id number]  Ruth Terry is a 60 y.o. female presenting on 10/24/2015 for New Patient (Initial Visit) (Patients current provider is moving and she wants to establish with you ); Establish Care; Sinusitis; and Cough  HPI this is a new patient to me in this practice. She has multiple medical problems that are followed by specialist. She sees Dr. Marina Goodell for gastroenterology. She has had long term issues with severe IBS, SIBO infection, gastroparesis, GERD, hyperglycemia, elevated LDL. She is recently had a cold and now is having significant amount of cough and mild wheezing in her chest. She states that she has minimal production. She has not seen any blood. She denies any fever or chills at this time.   Relevant past medical, surgical, family and social history reviewed and updated as indicated. Interim medical history since our last visit reviewed. Allergies and medications reviewed and updated.   Data reviewed from any sources in EPIC.  Review of Systems  Constitutional: Positive for fatigue. Negative for activity change and fever.  HENT: Positive for postnasal drip and rhinorrhea.   Eyes: Negative.   Respiratory: Positive for cough and wheezing.   Cardiovascular: Negative.  Negative for chest pain, palpitations and leg swelling.  Gastrointestinal: Negative.  Negative for abdominal pain.  Endocrine: Negative.   Genitourinary: Negative.  Negative for dysuria.  Musculoskeletal: Negative.   Skin: Negative.   Neurological: Negative.      Social History   Social History  . Marital status: Married    Spouse name: N/A  . Number of children: 0  . Years of education: N/A   Occupational History  . Magazine features editor school program  .  [employer]    Social History Main Topics  . Smoking status: Former Smoker    Packs/day: 0.50    Years: 6.00    Types: Cigarettes  . Smokeless tobacco: Never Used     Comment: USE SMOKELESS CIGARETTES  . Alcohol use 0.0 oz/week     Comment: 1 a week  . Drug use: No  . Sexual activity: Not on file   Other Topics Concern  . Not on file   Social History Narrative  . No narrative on file    No past surgical history on file.  Family History  Problem Relation Age of Onset  . Hypertension Mother   . Diabetes Father   . Heart attack Father     Age 52      Medication List       Accurate as of 10/24/15 11:59 PM. Always use your most recent med list.          ALIGN 4 MG Caps Take 1 capsule by mouth daily.   AMBULATORY NON FORMULARY MEDICATION Domperidone 10mg  take one at bedtime   azithromycin 250 MG tablet Commonly known as:  ZITHROMAX Z-PAK As directed   benzonatate 200 MG capsule Commonly known as:  TESSALON Take 1 capsule (200 mg total) by mouth 2 (two) times daily as needed for cough.   dexlansoprazole 60 MG capsule Commonly known as:  DEXILANT Take 1 capsule (60 mg total) by mouth daily.  dicyclomine 10 MG capsule Commonly known as:  BENTYL Take one po TID prn   linaclotide 145 MCG Caps capsule Commonly known as:  LINZESS TAKE ONE (1) CAPSULE EACH DAY   polyethylene glycol powder powder Commonly known as:  GLYCOLAX/MIRALAX Take 17 g by mouth daily.   psyllium 58.6 % packet Commonly known as:  METAMUCIL Take 1 packet by mouth daily.   sucralfate 1 GM/10ML suspension Commonly known as:  CARAFATE Take 10 mLs (1 g total) by mouth 4 (four) times daily -  with meals and at bedtime.   sucralfate 1 g tablet Commonly known as:  CARAFATE Take 1 tablet (1 g total) by mouth 4 (four) times daily -  with meals and at bedtime. Crush tablet and add water to make a slurry, and swallow.   URIBEL 118 MG Caps Take 1 capsule by mouth as needed.   valACYclovir 1000 MG  tablet Commonly known as:  VALTREX Take 1,000 mg by mouth at bedtime.          Objective:    BP 127/83   Pulse 76   Temp 97.6 F (36.4 C) (Oral)   Ht 5' (1.524 m)   Wt 109 lb 3.2 oz (49.5 kg)   BMI 21.33 kg/m   Allergies  Allergen Reactions  . Chlordiazepoxide-Clidinium Itching  . Ciprofloxacin Itching    Irritates stomach  . Flagyl [Metronidazole]     Severe yeast infection  . Sulfa Drugs Cross Reactors Nausea Only  . Nitrofurantoin Monohyd Macro Rash   Wt Readings from Last 3 Encounters:  10/24/15 109 lb 3.2 oz (49.5 kg)  09/21/15 115 lb (52.2 kg)  07/24/15 112 lb 2 oz (50.9 kg)    Physical Exam  Constitutional: She is oriented to person, place, and time. She appears well-developed and well-nourished.  HENT:  Head: Normocephalic and atraumatic.  Right Ear: Tympanic membrane normal. No swelling. No decreased hearing is noted.  Left Ear: No swelling. No decreased hearing is noted.  Nose: No mucosal edema.  Mouth/Throat: No posterior oropharyngeal erythema.  Eyes: Conjunctivae and EOM are normal. Pupils are equal, round, and reactive to light.  Neck: Normal range of motion. Neck supple.  Cardiovascular: Normal rate, regular rhythm, normal heart sounds and intact distal pulses.   Pulmonary/Chest: Effort normal. She has wheezes.  Abdominal: Soft. Bowel sounds are normal.  Neurological: She is alert and oriented to person, place, and time. She has normal reflexes.  Skin: Skin is warm and dry. No rash noted.  Psychiatric: She has a normal mood and affect. Her behavior is normal. Judgment and thought content normal.        Assessment & Plan:   1. Acute bronchitis, unspecified organism - azithromycin (ZITHROMAX Z-PAK) 250 MG tablet; As directed  Dispense: 6 tablet; Refill: 0 - benzonatate (TESSALON) 200 MG capsule; Take 1 capsule (200 mg total) by mouth 2 (two) times daily as needed for cough.  Dispense: 40 capsule; Refill: 0  2. Gastroesophageal reflux disease  without esophagitis  3. Irritable bowel syndrome with diarrhea  4. Gastroparesis  5. Tobacco abuse  6. Hyperglycemia - CMP14+EGFR; Future - Bayer DCA Hb A1c Waived; Future - Lipid panel; Future  7. Elevated LDL cholesterol level - Lipid panel; Future   Continue all other maintenance medications as listed above. Educational handout given for bronchitis  Follow up plan: Return in about 3 months (around 01/24/2016) for rechec conditions and meds.  Remus Loffler PA-C [facility] [address]  [place], [place] [address] [phone]   10/27/2015, 8:09 AM

## 2015-10-29 ENCOUNTER — Ambulatory Visit (INDEPENDENT_AMBULATORY_CARE_PROVIDER_SITE_OTHER): Payer: BLUE CROSS/BLUE SHIELD | Admitting: Physician Assistant

## 2015-10-29 ENCOUNTER — Encounter: Payer: Self-pay | Admitting: Physician Assistant

## 2015-10-29 VITALS — BP 130/79 | HR 77 | Temp 97.3°F | Ht 60.0 in | Wt 109.0 lb

## 2015-10-29 DIAGNOSIS — J209 Acute bronchitis, unspecified: Secondary | ICD-10-CM | POA: Diagnosis not present

## 2015-10-29 DIAGNOSIS — R059 Cough, unspecified: Secondary | ICD-10-CM

## 2015-10-29 DIAGNOSIS — R05 Cough: Secondary | ICD-10-CM | POA: Diagnosis not present

## 2015-10-29 MED ORDER — ALBUTEROL SULFATE HFA 108 (90 BASE) MCG/ACT IN AERS: 2.0000 | INHALATION_SPRAY | Freq: Four times a day (QID) | RESPIRATORY_TRACT | 1 refills | Status: DC | PRN

## 2015-10-29 MED ORDER — PREDNISONE 10 MG (21) PO TBPK: ORAL_TABLET | ORAL | 1 refills | Status: DC

## 2015-10-29 MED ORDER — FLUCONAZOLE 150 MG PO TABS: 150.0000 mg | ORAL_TABLET | Freq: Once | ORAL | 2 refills | Status: AC

## 2015-10-29 MED ORDER — CEFDINIR 300 MG PO CAPS: 300.0000 mg | ORAL_CAPSULE | Freq: Two times a day (BID) | ORAL | 0 refills | Status: DC

## 2015-10-29 NOTE — Patient Instructions (Signed)

## 2015-10-29 NOTE — Progress Notes (Signed)
BP 130/79   Pulse 77   Temp 97.3 F (36.3 C) (Oral)   Ht 5' (1.524 m)   Wt 109 lb (49.4 kg)   BMI 21.29 kg/m    Subjective:    Patient ID: Ruth Terry, female    DOB: May 15, 1955, 60 y.o.   MRN: [id number]  HPI: Ruth Terry is a 60 y.o. female presenting on 10/29/2015 for Still no better from [facility]  She has continued with a significant cough and sinus pressure and drainage. She denies any fever or chills. She was very tired of the weekend and slept a lot. She states that the cough is very tight at times. She had a significant coughing spell at night. All of her medications are reviewed today.  Past Medical History:  Diagnosis Date  . Anal fissure   . Chronic constipation   . Diverticulosis 04-20-10   colonoscopy  . Gastroparesis   . GERD (gastroesophageal reflux disease)   . Hemorrhoids   . Hyperlipidemia   . IBS (irritable bowel syndrome)   . MVP (mitral valve prolapse)   . Polyp, stomach 04-20-10   egd  . Shingles   . UTI (lower urinary tract infection)    Relevant past medical, surgical, family and social history reviewed and updated as indicated. Interim medical history since our last visit reviewed. Allergies and medications reviewed and updated. DATA REVIEWED: CHART IN EPIC  Social History   Social History  . Marital status: Married    Spouse name: N/A  . Number of children: 0  . Years of education: N/A   Occupational History  . Magazine features editor school program  .  [employer]   Social History Main Topics  . Smoking status: Former Smoker    Packs/day: 0.50    Years: 6.00    Types: Cigarettes  . Smokeless tobacco: Never Used     Comment: USE SMOKELESS CIGARETTES  . Alcohol use 0.0 oz/week     Comment: 1 a week  . Drug use: No  . Sexual activity: Not on file   Other Topics Concern  . Not on file   Social History Narrative  . No narrative on file    No past surgical history on file.  Family History  Problem  Relation Age of Onset  . Hypertension Mother   . Diabetes Father   . Heart attack Father     Age 20    Review of Systems  Constitutional: Negative.   HENT: Positive for congestion, postnasal drip and sore throat.   Eyes: Negative.   Respiratory: Positive for cough, shortness of breath and wheezing.   Gastrointestinal: Negative.   Genitourinary: Negative.       Medication List       Accurate as of 10/29/15 11:37 AM. Always use your most recent med list.          albuterol 108 (90 Base) MCG/ACT inhaler Commonly known as:  PROVENTIL HFA;VENTOLIN HFA Inhale 2 puffs into the lungs every 6 (six) hours as needed for wheezing or shortness of breath.   ALIGN 4 MG Caps Take 1 capsule by mouth daily.   AMBULATORY NON FORMULARY MEDICATION Domperidone 10mg  take one at bedtime   cefdinir 300 MG capsule Commonly known as:  OMNICEF Take 1 capsule (300 mg total) by mouth 2 (two) times daily. 1 po BID   dexlansoprazole 60 MG capsule Commonly known as:  DEXILANT Take 1 capsule (60 mg total) by  mouth daily.   dicyclomine 10 MG capsule Commonly known as:  BENTYL Take one po TID prn   fluconazole 150 MG tablet Commonly known as:  DIFLUCAN Take 1 tablet (150 mg total) by mouth once.   linaclotide 145 MCG Caps capsule Commonly known as:  LINZESS TAKE ONE (1) CAPSULE EACH DAY   polyethylene glycol powder powder Commonly known as:  GLYCOLAX/MIRALAX Take 17 g by mouth daily.   predniSONE 10 MG (21) Tbpk tablet Commonly known as:  STERAPRED UNI-PAK 21 TAB As directed x 6 days   psyllium 58.6 % packet Commonly known as:  METAMUCIL Take 1 packet by mouth daily.   sucralfate 1 GM/10ML suspension Commonly known as:  CARAFATE Take 10 mLs (1 g total) by mouth 4 (four) times daily -  with meals and at bedtime.   URIBEL 118 MG Caps Take 1 capsule by mouth as needed.   valACYclovir 1000 MG tablet Commonly known as:  VALTREX Take 1,000 mg by mouth at bedtime.            Objective:    BP 130/79   Pulse 77   Temp 97.3 F (36.3 C) (Oral)   Ht 5' (1.524 m)   Wt 109 lb (49.4 kg)   BMI 21.29 kg/m   Allergies  Allergen Reactions  . Chlordiazepoxide-Clidinium Itching  . Ciprofloxacin Itching    Irritates stomach  . Flagyl [Metronidazole]     Severe yeast infection  . Sulfa Drugs Cross Reactors Nausea Only  . Nitrofurantoin Monohyd Macro Rash    Wt Readings from Last 3 Encounters:  10/29/15 109 lb (49.4 kg)  10/24/15 109 lb 3.2 oz (49.5 kg)  09/21/15 115 lb (52.2 kg)    Physical Exam  Constitutional: She is oriented to person, place, and time. She appears well-developed and well-nourished.  HENT:  Head: Normocephalic and atraumatic.  Right Ear: There is drainage and tenderness.  Left Ear: There is drainage and tenderness.  Nose: Mucosal edema and rhinorrhea present. Right sinus exhibits maxillary sinus tenderness and frontal sinus tenderness. Left sinus exhibits maxillary sinus tenderness and frontal sinus tenderness.  Mouth/Throat: Oropharyngeal exudate and posterior oropharyngeal erythema present.  Eyes: Conjunctivae and EOM are normal. Pupils are equal, round, and reactive to light.  Neck: Normal range of motion. Neck supple.  Cardiovascular: Normal rate, regular rhythm, normal heart sounds and intact distal pulses.   Pulmonary/Chest: Effort normal. She has wheezes in the right upper field and the left upper field.  Abdominal: Soft. Bowel sounds are normal.  Neurological: She is alert and oriented to person, place, and time. She has normal reflexes.  Skin: Skin is warm and dry. No rash noted.  Psychiatric: She has a normal mood and affect. Her behavior is normal. Judgment and thought content normal.        Assessment & Plan:   1. Acute bronchitis, unspecified organism - cefdinir (OMNICEF) 300 MG capsule; Take 1 capsule (300 mg total) by mouth 2 (two) times daily. 1 po BID  Dispense: 20 capsule; Refill: 0 - albuterol (PROVENTIL  HFA;VENTOLIN HFA) 108 (90 Base) MCG/ACT inhaler; Inhale 2 puffs into the lungs every 6 (six) hours as needed for wheezing or shortness of breath.  Dispense: 1 Inhaler; Refill: 1 - predniSONE (STERAPRED UNI-PAK 21 TAB) 10 MG (21) TBPK tablet; As directed x 6 days  Dispense: 21 tablet; Refill: 1  2. Cough  Continue all other maintenance medications as listed above.  Follow up plan: Return if symptoms worsen or fail to  improve.  Educational handout given for bronchitis  Remus Loffler PA-C [facility] [address]  [place], [place] [address] [phone]   10/29/2015, 11:37 AM

## 2015-11-10 DIAGNOSIS — H04123 Dry eye syndrome of bilateral lacrimal glands: Secondary | ICD-10-CM | POA: Diagnosis not present

## 2015-11-13 ENCOUNTER — Telehealth: Payer: Self-pay | Admitting: Physician Assistant

## 2015-11-13 MED ORDER — FLUCONAZOLE 150 MG PO TABS: ORAL_TABLET | ORAL | 0 refills | Status: DC

## 2015-11-13 NOTE — Telephone Encounter (Signed)
Left detailed message regarding RX 

## 2015-11-13 NOTE — Telephone Encounter (Signed)
Rx sent  Laroy Apple, MD [facility] 11/13/2015, 12:02 PM

## 2015-11-18 ENCOUNTER — Telehealth: Payer: Self-pay | Admitting: Physician Assistant

## 2015-11-18 NOTE — Telephone Encounter (Signed)
Patient called wanting to talk about mother.  Patient mothers chart open

## 2015-11-24 ENCOUNTER — Ambulatory Visit: Payer: BLUE CROSS/BLUE SHIELD | Admitting: Physician Assistant

## 2015-11-24 ENCOUNTER — Telehealth: Payer: Self-pay | Admitting: Internal Medicine

## 2015-11-24 NOTE — Telephone Encounter (Signed)
Pt states that she has had shingles and pneumonia and been on a lot of different medications. States she has had constipation and wants to know what she can take. Discussed with pt that she can take miralax 2-3 doses/day to have BM. Pt aware.

## 2015-12-16 ENCOUNTER — Ambulatory Visit (INDEPENDENT_AMBULATORY_CARE_PROVIDER_SITE_OTHER): Payer: BLUE CROSS/BLUE SHIELD | Admitting: Physician Assistant

## 2015-12-16 ENCOUNTER — Encounter: Payer: Self-pay | Admitting: Physician Assistant

## 2015-12-16 VITALS — BP 122/77 | HR 80 | Temp 96.7°F | Ht 60.0 in | Wt 111.2 lb

## 2015-12-16 DIAGNOSIS — J0100 Acute maxillary sinusitis, unspecified: Secondary | ICD-10-CM

## 2015-12-16 MED ORDER — AMOXICILLIN 250 MG/5ML PO SUSR: 500.0000 mg | Freq: Three times a day (TID) | ORAL | 0 refills | Status: DC

## 2015-12-16 MED ORDER — FLUCONAZOLE 150 MG PO TABS: ORAL_TABLET | ORAL | 0 refills | Status: DC

## 2015-12-16 NOTE — Patient Instructions (Signed)

## 2015-12-16 NOTE — Progress Notes (Signed)
BP 122/77   Pulse 80   Temp (!) 96.7 F (35.9 C) (Oral)   Ht 5' (1.524 m)   Wt 111 lb 3.2 oz (50.4 kg)   BMI 21.72 kg/m    Subjective:    Patient ID: Ruth Terry, female    DOB: 18-Sep-1955, 60 y.o.   MRN: [id number]  HPI: Ruth Terry is a 60 y.o. female presenting on 12/16/2015 for Sinus pressure and Ear Pain (right)  Sick for one week and sinus pressure severe. Thought it was a tooth issue but no pain with eating or heat/cold. Mucus is green in color.  Right ear pressure is bad, without dizziness. Denies NVD.  Relevant past medical, surgical, family and social history reviewed and updated as indicated. Allergies and medications reviewed and updated.  Past Medical History:  Diagnosis Date  . Anal fissure   . Chronic constipation   . Diverticulosis 04-20-10   colonoscopy  . Gastroparesis   . GERD (gastroesophageal reflux disease)   . Hemorrhoids   . Hyperlipidemia   . IBS (irritable bowel syndrome)   . MVP (mitral valve prolapse)   . Polyp, stomach 04-20-10   egd  . Shingles   . UTI (lower urinary tract infection)     History reviewed. No pertinent surgical history.  Review of Systems  Constitutional: Positive for chills and fatigue. Negative for activity change and appetite change.  HENT: Positive for congestion, postnasal drip and sore throat.   Eyes: Negative.   Respiratory: Negative for cough and wheezing.   Cardiovascular: Negative.  Negative for chest pain, palpitations and leg swelling.  Gastrointestinal: Negative.   Genitourinary: Negative.   Musculoskeletal: Negative.   Skin: Negative.   Neurological: Positive for headaches.      Medication List       Accurate as of 12/16/15 12:03 PM. Always use your most recent med list.          albuterol 108 (90 Base) MCG/ACT inhaler Commonly known as:  PROVENTIL HFA;VENTOLIN HFA Inhale 2 puffs into the lungs every 6 (six) hours as needed for wheezing or shortness of breath.   ALIGN 4 MG Caps Take  1 capsule by mouth daily.   AMBULATORY NON FORMULARY MEDICATION Domperidone 10mg  take one at bedtime   amoxicillin 250 MG/5ML suspension Commonly known as:  AMOXIL Take 10 mLs (500 mg total) by mouth 3 (three) times daily.   cefdinir 300 MG capsule Commonly known as:  OMNICEF Take 1 capsule (300 mg total) by mouth 2 (two) times daily. 1 po BID   dexlansoprazole 60 MG capsule Commonly known as:  DEXILANT Take 1 capsule (60 mg total) by mouth daily.   dicyclomine 10 MG capsule Commonly known as:  BENTYL Take one po TID prn   fluconazole 150 MG tablet Commonly known as:  DIFLUCAN Take one pill and repeat in 3 days   linaclotide 145 MCG Caps capsule Commonly known as:  LINZESS TAKE ONE (1) CAPSULE EACH DAY   polyethylene glycol powder powder Commonly known as:  GLYCOLAX/MIRALAX Take 17 g by mouth daily.   predniSONE 10 MG (21) Tbpk tablet Commonly known as:  STERAPRED UNI-PAK 21 TAB As directed x 6 days   psyllium 58.6 % packet Commonly known as:  METAMUCIL Take 1 packet by mouth daily.   sucralfate 1 GM/10ML suspension Commonly known as:  CARAFATE Take 10 mLs (1 g total) by mouth 4 (four) times daily -  with meals and at bedtime.   URIBEL 118 MG  Caps Take 1 capsule by mouth as needed.   valACYclovir 1000 MG tablet Commonly known as:  VALTREX Take 1,000 mg by mouth at bedtime.          Objective:    BP 122/77   Pulse 80   Temp (!) 96.7 F (35.9 C) (Oral)   Ht 5' (1.524 m)   Wt 111 lb 3.2 oz (50.4 kg)   BMI 21.72 kg/m   Allergies  Allergen Reactions  . Chlordiazepoxide-Clidinium Itching  . Ciprofloxacin Itching    Irritates stomach  . Flagyl [Metronidazole]     Severe yeast infection  . Sulfa Drugs Cross Reactors Nausea Only  . Nitrofurantoin Monohyd Macro Rash    Physical Exam  Constitutional: She is oriented to person, place, and time. She appears well-developed and well-nourished.  HENT:  Head: Normocephalic and atraumatic.  Right Ear:  Tympanic membrane and external ear normal. No middle ear effusion.  Left Ear: Tympanic membrane and external ear normal.  No middle ear effusion.  Nose: Mucosal edema and rhinorrhea present. Right sinus exhibits no maxillary sinus tenderness. Left sinus exhibits no maxillary sinus tenderness.  Mouth/Throat: Uvula is midline. Posterior oropharyngeal erythema present.  Eyes: Conjunctivae and EOM are normal. Pupils are equal, round, and reactive to light. Right eye exhibits no discharge. Left eye exhibits no discharge.  Neck: Normal range of motion.  Cardiovascular: Normal rate, regular rhythm and normal heart sounds.   Pulmonary/Chest: Effort normal and breath sounds normal. No respiratory distress. She has no wheezes.  Abdominal: Soft.  Lymphadenopathy:    She has no cervical adenopathy.  Neurological: She is alert and oriented to person, place, and time.  Skin: Skin is warm and dry.  Psychiatric: She has a normal mood and affect.  Nursing note and vitals reviewed.       Assessment & Plan:   1. Acute non-recurrent maxillary sinusitis - amoxicillin (AMOXIL) 250 MG/5ML suspension; Take 10 mLs (500 mg total) by mouth 3 (three) times daily.  Dispense: 300 mL; Refill: 0 - fluconazole (DIFLUCAN) 150 MG tablet; Take one pill and repeat in 3 days  Dispense: 2 tablet; Refill: 0   Continue all other maintenance medications as listed above.  Follow up plan: Return if symptoms worsen or fail to improve.  Educational handout given for sinusitis  Remus Loffler PA-C [facility] [address]  [place], [place] [address] [phone]   12/16/2015, 12:03 PM

## 2015-12-19 ENCOUNTER — Telehealth: Payer: Self-pay | Admitting: Physician Assistant

## 2015-12-19 NOTE — Telephone Encounter (Signed)
Try OTC sudafed which is phenylephrine, much milder decongestant.  Also can take amoxil longer, for the repeat problems she has we will have people take it 14-21 days.  Flonase OTC or Nasacort OTC for swelling. Can even use saline spray many many times a day.

## 2015-12-19 NOTE — Telephone Encounter (Signed)
Patient aware.

## 2015-12-19 NOTE — Telephone Encounter (Signed)
Patient has been taking amoxicillin for 3 days and hasn't seen an improvement in her sinus symptoms. Mainly complains of facial pain/pressure and ear pain. Explained that it may take a little longer for amox to work. It is also upsetting her stomach despite taking it with food.   I asked about OTC meds and she is only taking Tylenol. In the past it has been recommended that she not take pseudoephedrine due to MVP. She has not tried [facility].  She wants to know what Glenard Haring suggests. Should she switch to another antibiotic? She mentioned erythromycin. I explained that erythromycin would probably cause more GI upset than amoxicillin.   What are your thoughts?

## 2016-01-23 ENCOUNTER — Telehealth: Payer: Self-pay | Admitting: Physician Assistant

## 2016-01-23 MED ORDER — LINACLOTIDE 145 MCG PO CAPS: ORAL_CAPSULE | ORAL | 3 refills | Status: DC

## 2016-01-23 MED ORDER — DEXLANSOPRAZOLE 60 MG PO CPDR: 60.0000 mg | DELAYED_RELEASE_CAPSULE | Freq: Every day | ORAL | 3 refills | Status: DC

## 2016-01-23 NOTE — Telephone Encounter (Signed)
Patient aware.

## 2016-01-23 NOTE — Telephone Encounter (Signed)
Please advise. Last filled by Dr. Henrene Pastor.

## 2016-01-23 NOTE — Telephone Encounter (Signed)
Prescription sent to pharmacy.

## 2016-02-06 ENCOUNTER — Telehealth: Payer: Self-pay | Admitting: Internal Medicine

## 2016-02-06 ENCOUNTER — Other Ambulatory Visit: Payer: Self-pay

## 2016-02-06 MED ORDER — HYDROCORTISONE ACETATE 25 MG RE SUPP: 25.0000 mg | Freq: Two times a day (BID) | RECTAL | 0 refills | Status: DC

## 2016-02-06 NOTE — Telephone Encounter (Signed)
Pt states she is going to the bathroom but she feels like she is not emptying completely and she is having issues with her hemorrhoids. Pt is taking miralax daily and linzess daily. Pt instructed to try recticare otc for the painful hemorrhoids. Also reports they are inflamed. Pt will try hydrocortisone suppositories for the inflammation. Script sent to the pharmacy per Alonza Bogus PA.

## 2016-02-13 ENCOUNTER — Telehealth: Payer: Self-pay | Admitting: Internal Medicine

## 2016-02-13 NOTE — Telephone Encounter (Signed)
Attempted to return call and received message that "voicemail is full and cannot accept messages at this time."

## 2016-02-20 ENCOUNTER — Encounter: Payer: Self-pay | Admitting: Physician Assistant

## 2016-02-20 ENCOUNTER — Ambulatory Visit (INDEPENDENT_AMBULATORY_CARE_PROVIDER_SITE_OTHER): Payer: BLUE CROSS/BLUE SHIELD | Admitting: Physician Assistant

## 2016-02-20 VITALS — BP 114/74 | HR 101 | Temp 98.1°F | Ht 60.0 in | Wt 110.0 lb

## 2016-02-20 DIAGNOSIS — K3184 Gastroparesis: Secondary | ICD-10-CM

## 2016-02-20 DIAGNOSIS — J0111 Acute recurrent frontal sinusitis: Secondary | ICD-10-CM

## 2016-02-20 DIAGNOSIS — K648 Other hemorrhoids: Secondary | ICD-10-CM

## 2016-02-20 DIAGNOSIS — B356 Tinea cruris: Secondary | ICD-10-CM | POA: Diagnosis not present

## 2016-02-20 DIAGNOSIS — K219 Gastro-esophageal reflux disease without esophagitis: Secondary | ICD-10-CM

## 2016-02-20 MED ORDER — FLUCONAZOLE 150 MG PO TABS: 150.0000 mg | ORAL_TABLET | ORAL | 11 refills | Status: DC

## 2016-02-20 MED ORDER — AZITHROMYCIN 250 MG PO TABS: ORAL_TABLET | ORAL | 0 refills | Status: DC

## 2016-02-20 MED ORDER — SIMETHICONE 250 MG PO CAPS: 250.0000 mg | ORAL_CAPSULE | Freq: Three times a day (TID) | ORAL | Status: AC

## 2016-02-20 NOTE — Patient Instructions (Signed)
° °Gastroparesis °Introduction °Gastroparesis, also called delayed gastric emptying, is a condition in which food takes longer than normal to empty from the stomach. The condition is usually long-lasting (chronic). °What are the causes? °This condition may be caused by: °· An endocrine disorder, such as hypothyroidism or diabetes. Diabetes is the most common cause of this condition. °· A nervous system disease, such as Parkinson disease or multiple sclerosis. °· Cancer, infection, or surgery of the stomach or vagus nerve. °· A connective tissue disorder, such as scleroderma. °· Certain medicines. °In most cases, the cause is not known. °What increases the risk? °This condition is more likely to develop in: °· People with certain disorders, including endocrine disorders, eating disorders, amyloidosis, and scleroderma. °· People with certain diseases, including Parkinson disease or multiple sclerosis. °· People with cancer or infection of the stomach or vagus nerve. °· People who have had surgery on the stomach or vagus nerve. °· People who take certain medicines. °· Women. °What are the signs or symptoms? °Symptoms of this condition include: °· An early feeling of fullness when eating. °· Nausea. °· Weight loss. °· Vomiting. °· Heartburn. °· Abdominal bloating. °· Inconsistent blood glucose levels. °· Lack of appetite. °· Acid from the stomach coming up into the esophagus (gastroesophageal reflux). °· Spasms of the stomach. °Symptoms may come and go. °How is this diagnosed? °This condition is diagnosed with tests, such as: °· Tests that check how long it takes food to move through the stomach and intestines. These tests include: °¨ Upper gastrointestinal (GI) series. In this test, X-rays of the intestines are taken after you drink a liquid. The liquid makes the intestines show up better on the X-rays. °¨ Gastric emptying scintigraphy. In this test, scans are taken after you eat food that contains a small amount of  radioactive material. °¨ Wireless capsule GI monitoring system. This test involves swallowing a capsule that records information about movement through the stomach. °· Gastric manometry. This test measures electrical and muscular activity in the stomach. It is done with a thin tube that is passed down the throat and into the stomach. °· Endoscopy. This test checks for abnormalities in the lining of the stomach. It is done with a long, thin tube that is passed down the throat and into the stomach. °· An ultrasound. This test can help rule out gallbladder disease or pancreatitis as a cause of your symptoms. It uses sound waves to take pictures of the inside of your body. °How is this treated? °There is no cure for gastroparesis. This condition may be managed with: °· Treatment of the underlying condition causing the gastroparesis. °· Lifestyle changes, including exercise and dietary changes. Dietary changes can include: °¨ Changes in what and when you eat. °¨ Eating smaller meals more often. °¨ Eating low-fat foods. °¨ Eating low-fiber forms of high-fiber foods, such as cooked vegetables instead of raw vegetables. °¨ Having liquid foods in place of solid foods. Liquid foods are easier to digest. °· Medicines. These may be given to control nausea and vomiting and to stimulate stomach muscles. °· Getting food through a feeding tube. This may be done in severe cases. °· A gastric neurostimulator. This is a device that is inserted into the body with surgery. It helps improve stomach emptying and control nausea and vomiting. °Follow these instructions at home: °· Follow your health care provider's instructions about exercise and diet. °· Take medicines only as directed by your health care provider. °Contact a health care provider   if: °· Your symptoms do not improve with treatment. °· You have new symptoms. °Get help right away if: °· You have severe abdominal pain that does not improve with treatment. °· You have nausea  that does not go away. °· You cannot keep fluids down. °This information is not intended to replace advice given to you by your health care provider. Make sure you discuss any questions you have with your health care provider. °Document Released: 12/21/2004 Document Revised: 05/29/2015 Document Reviewed: 12/17/2013 °© 2017 Elsevier ° °

## 2016-02-23 DIAGNOSIS — J0111 Acute recurrent frontal sinusitis: Secondary | ICD-10-CM | POA: Insufficient documentation

## 2016-02-23 NOTE — Progress Notes (Signed)
BP 114/74   Pulse (!) 101   Temp 98.1 F (36.7 C) (Oral)   Ht 5' (1.524 m)   Wt 110 lb (49.9 kg)   BMI 21.48 kg/m    Subjective:    Patient ID: Ruth Terry, female    DOB: [birth year], 61 y.o.   MRN: [id number]  HPI: Ruth Terry is a 61 y.o. female presenting on 02/20/2016 for Sinus Problem; Hemorrhoids; and Abdominal Pain (gas,bloating)  This patient has had many days of sinus headache and postnasal drainage. There is copious drainage at times. Denies any fever at this time. There has been a history of sinus infections in the past.  No history of sinus surgery. There is cough at night. It has become more prevalent in recent days. Also with rectal irritation and continued gastroparesis with Bloating of the abdomen. She has not been taking her simethicone or Phazyme  Relevant past medical, surgical, family and social history reviewed and updated as indicated. Allergies and medications reviewed and updated.  Past Medical History:  Diagnosis Date  . Anal fissure   . Chronic constipation   . Diverticulosis 04-20-10   colonoscopy  . Gastroparesis   . GERD (gastroesophageal reflux disease)   . Hemorrhoids   . Hyperlipidemia   . IBS (irritable bowel syndrome)   . MVP (mitral valve prolapse)   . Polyp, stomach 04-20-10   egd  . Shingles   . UTI (lower urinary tract infection)     History reviewed. No pertinent surgical history.  Review of Systems  Constitutional: Positive for chills and fatigue. Negative for activity change and appetite change.  HENT: Positive for congestion, postnasal drip and sore throat.   Eyes: Negative.   Respiratory: Positive for cough. Negative for shortness of breath and wheezing.   Cardiovascular: Negative.  Negative for chest pain, palpitations and leg swelling.  Gastrointestinal: Positive for rectal pain.  Genitourinary: Negative.   Musculoskeletal: Negative.   Skin: Positive for rash.  Neurological: Positive for headaches.    Allergies  as of 02/20/2016      Reactions   Chlordiazepoxide-clidinium Itching   Ciprofloxacin Itching   Irritates stomach   Flagyl [metronidazole]    Severe yeast infection   Sulfa Drugs Cross Reactors Nausea Only   Nitrofurantoin Monohyd Macro Rash      Medication List       Accurate as of 02/20/16 11:59 PM. Always use your most recent med list.          albuterol 108 (90 Base) MCG/ACT inhaler Commonly known as:  PROVENTIL HFA;VENTOLIN HFA Inhale 2 puffs into the lungs every 6 (six) hours as needed for wheezing or shortness of breath.   ALIGN 4 MG Caps Take 1 capsule by mouth daily.   AMBULATORY NON FORMULARY MEDICATION Domperidone 10mg  take one at bedtime   azithromycin 250 MG tablet Commonly known as:  ZITHROMAX Z-PAK Take as directed   dexlansoprazole 60 MG capsule Commonly known as:  DEXILANT Take 1 capsule (60 mg total) by mouth daily.   dicyclomine 10 MG capsule Commonly known as:  BENTYL Take one po TID prn   fluconazole 150 MG tablet Commonly known as:  DIFLUCAN Take one pill and repeat in 3 days   fluconazole 150 MG tablet Commonly known as:  DIFLUCAN Take 1 tablet (150 mg total) by mouth once a week.   hydrocortisone 25 MG suppository Commonly known as:  ANUSOL-HC Place 1 suppository (25 mg total) rectally 2 (two) times daily.   linaclotide  145 MCG Caps capsule Commonly known as:  LINZESS TAKE ONE (1) CAPSULE EACH DAY   polyethylene glycol powder powder Commonly known as:  GLYCOLAX/MIRALAX Take 17 g by mouth daily.   predniSONE 10 MG (21) Tbpk tablet Commonly known as:  STERAPRED UNI-PAK 21 TAB As directed x 6 days   psyllium 58.6 % packet Commonly known as:  METAMUCIL Take 1 packet by mouth daily.   Simethicone 250 MG Caps Commonly known as:  PHAZYME MAXIMUM STRENGTH Take 250 mg by mouth 3 (three) times daily.   sucralfate 1 GM/10ML suspension Commonly known as:  CARAFATE Take 10 mLs (1 g total) by mouth 4 (four) times daily -  with meals  and at bedtime.   URIBEL 118 MG Caps Take 1 capsule by mouth as needed.   valACYclovir 1000 MG tablet Commonly known as:  VALTREX Take 1,000 mg by mouth at bedtime.          Objective:    BP 114/74   Pulse (!) 101   Temp 98.1 F (36.7 C) (Oral)   Ht 5' (1.524 m)   Wt 110 lb (49.9 kg)   BMI 21.48 kg/m   Allergies  Allergen Reactions  . Chlordiazepoxide-Clidinium Itching  . Ciprofloxacin Itching    Irritates stomach  . Flagyl [Metronidazole]     Severe yeast infection  . Sulfa Drugs Cross Reactors Nausea Only  . Nitrofurantoin Monohyd Macro Rash    Physical Exam  Constitutional: She is oriented to person, place, and time. She appears well-developed and well-nourished.  HENT:  Head: Normocephalic and atraumatic.  Right Ear: Tympanic membrane and external ear normal. No middle ear effusion.  Left Ear: Tympanic membrane and external ear normal.  No middle ear effusion.  Nose: Mucosal edema and rhinorrhea present. Right sinus exhibits no maxillary sinus tenderness. Left sinus exhibits no maxillary sinus tenderness.  Mouth/Throat: Uvula is midline. Posterior oropharyngeal erythema present.  Eyes: Conjunctivae and EOM are normal. Pupils are equal, round, and reactive to light. Right eye exhibits no discharge. Left eye exhibits no discharge.  Neck: Normal range of motion.  Cardiovascular: Normal rate, regular rhythm and normal heart sounds.   Pulmonary/Chest: Effort normal and breath sounds normal. No respiratory distress. She has no wheezes.  Abdominal: Soft. She exhibits distension. She exhibits no mass. There is no tenderness. There is no rebound and no guarding.  Genitourinary:     Genitourinary Comments: Sl brown skin changes with no lesions  Lymphadenopathy:    She has no cervical adenopathy.  Neurological: She is alert and oriented to person, place, and time.  Skin: Skin is warm and dry.  Psychiatric: She has a normal mood and affect.        Assessment &  Plan:   1. Internal hemorrhoids  2. Gastroesophageal reflux disease without esophagitis  3. Gastroparesis - Simethicone (PHAZYME MAXIMUM STRENGTH) 250 MG CAPS; Take 250 mg by mouth 3 (three) times daily.  Dispense: 14 capsule  4. Acute recurrent frontal sinusitis - azithromycin (ZITHROMAX Z-PAK) 250 MG tablet; Take as directed  Dispense: 6 each; Refill: 0  5. Tinea cruris - fluconazole (DIFLUCAN) 150 MG tablet; Take 1 tablet (150 mg total) by mouth once a week.  Dispense: 4 tablet; Refill: 11   Continue all other maintenance medications as listed above.  Follow up plan: Return in about 3 months (around 05/19/2016) for recheck.  Educational handout given for gastroparesis  Remus Loffler PA-C [facility] [address]  [place], [place]  [address] [phone]   02/23/2016, 2:12 PM

## 2016-02-25 DIAGNOSIS — M79672 Pain in left foot: Secondary | ICD-10-CM | POA: Diagnosis not present

## 2016-02-25 DIAGNOSIS — S93332D Other subluxation of left foot, subsequent encounter: Secondary | ICD-10-CM | POA: Diagnosis not present

## 2016-02-25 DIAGNOSIS — S93331D Other subluxation of right foot, subsequent encounter: Secondary | ICD-10-CM | POA: Diagnosis not present

## 2016-02-25 DIAGNOSIS — M79671 Pain in right foot: Secondary | ICD-10-CM | POA: Diagnosis not present

## 2016-03-03 ENCOUNTER — Ambulatory Visit (INDEPENDENT_AMBULATORY_CARE_PROVIDER_SITE_OTHER): Payer: BLUE CROSS/BLUE SHIELD | Admitting: Gastroenterology

## 2016-03-03 ENCOUNTER — Encounter: Payer: Self-pay | Admitting: Gastroenterology

## 2016-03-03 VITALS — BP 110/72 | HR 92 | Ht 60.0 in | Wt 111.2 lb

## 2016-03-03 DIAGNOSIS — L290 Pruritus ani: Secondary | ICD-10-CM

## 2016-03-03 DIAGNOSIS — K581 Irritable bowel syndrome with constipation: Secondary | ICD-10-CM

## 2016-03-03 DIAGNOSIS — R141 Gas pain: Secondary | ICD-10-CM

## 2016-03-03 DIAGNOSIS — R140 Abdominal distension (gaseous): Secondary | ICD-10-CM

## 2016-03-03 NOTE — Progress Notes (Signed)
03/03/2016 Ruth Terry [id number] Apr 01, 1955   HISTORY OF PRESENT ILLNESS:  Ruth Terry is a 61 year old white female known to Dr. Marina Goodell with history of GERD/gastroparesis and IBS.  She has been maintained on Dexilant and domperidone and takes Linzess daily for chronic constipation.  Also takes Metamucil daily and probiotics.  Last colonoscopy done 04/2010 showed mild diverticulosis otherwise negative exam.  Presents to our office today with recurrent complaints of gas/bloating and abdominal discomfort related to that. Also complains of perianal itching and irritation that comes intermittently. She was seen for this perianal issue earlier this year as well with minimal findings. She says that sometimes when she is constipated her stools look a little darker than normal, but not necessarily black. Has been taking Phazyme for her gas with maybe minimal relief.   Past Medical History:  Diagnosis Date  . Anal fissure   . Chronic constipation   . Diverticulosis 04-20-10   colonoscopy  . Gastroparesis   . GERD (gastroesophageal reflux disease)   . Hemorrhoids   . Hyperlipidemia   . IBS (irritable bowel syndrome)   . MVP (mitral valve prolapse)   . Polyp, stomach 04-20-10   egd  . Shingles   . UTI (lower urinary tract infection)    History reviewed. No pertinent surgical history.  reports that she has quit smoking. Her smoking use included Cigarettes. She has a 3.00 pack-year smoking history. She has never used smokeless tobacco. She reports that she drinks alcohol. She reports that she does not use drugs. family history includes Diabetes in her father; Heart attack in her father; Hypertension in her mother. Allergies  Allergen Reactions  . Chlordiazepoxide-Clidinium Itching  . Ciprofloxacin Itching    Irritates stomach  . Flagyl [Metronidazole]     Severe yeast infection  . Sulfa Drugs Cross Reactors Nausea Only  . Nitrofurantoin Monohyd Macro Rash      Outpatient Encounter  Prescriptions as of 03/03/2016  Medication Sig  . albuterol (PROVENTIL HFA;VENTOLIN HFA) 108 (90 Base) MCG/ACT inhaler Inhale 2 puffs into the lungs every 6 (six) hours as needed for wheezing or shortness of breath.  . AMBULATORY NON FORMULARY MEDICATION Domperidone 10mg  take one at bedtime  . dexlansoprazole (DEXILANT) 60 MG capsule Take 1 capsule (60 mg total) by mouth daily.  Marland Kitchen dicyclomine (BENTYL) 10 MG capsule Take one po TID prn  . fluconazole (DIFLUCAN) 150 MG tablet Take 1 tablet (150 mg total) by mouth once a week.  . hydrocortisone (ANUSOL-HC) 25 MG suppository Place 1 suppository (25 mg total) rectally 2 (two) times daily.  Marland Kitchen linaclotide (LINZESS) 145 MCG CAPS capsule TAKE ONE (1) CAPSULE EACH DAY  . Meth-Hyo-M Bl-Na Phos-Ph Sal (URIBEL) 118 MG CAPS Take 1 capsule by mouth as needed.  . Probiotic Product (ALIGN) 4 MG CAPS Take 1 capsule by mouth daily.  . psyllium (METAMUCIL) 58.6 % packet Take 1 packet by mouth daily.  . Simethicone (PHAZYME MAXIMUM STRENGTH) 250 MG CAPS Take 250 mg by mouth 3 (three) times daily.  . valACYclovir (VALTREX) 1000 MG tablet Take 1,000 mg by mouth at bedtime.   . polyethylene glycol powder (GLYCOLAX/MIRALAX) powder Take 17 g by mouth as needed.   . [DISCONTINUED] azithromycin (ZITHROMAX Z-PAK) 250 MG tablet Take as directed  . [DISCONTINUED] fluconazole (DIFLUCAN) 150 MG tablet Take one pill and repeat in 3 days (Patient not taking: Reported on 02/20/2016)  . [DISCONTINUED] predniSONE (STERAPRED UNI-PAK 21 TAB) 10 MG (21) TBPK tablet As directed x 6 days  . [  DISCONTINUED] sucralfate (CARAFATE) 1 GM/10ML suspension Take 10 mLs (1 g total) by mouth 4 (four) times daily -  with meals and at bedtime. (Patient taking differently: Take 1 g by mouth 4 (four) times daily -  with meals and at bedtime. AS NEEDED)   No facility-administered encounter medications on file as of 03/03/2016.      REVIEW OF SYSTEMS  : All other systems reviewed and negative except where  noted in the History of Present Illness.   PHYSICAL EXAM: BP 110/72 (BP Location: Left Arm, Patient Position: Sitting, Cuff Size: Normal)   Pulse 92   Ht 5' (1.524 m)   Wt 111 lb 4 oz (50.5 kg)   BMI 21.73 kg/m  General: Well developed white female in no acute distress Head: Normocephalic and atraumatic Eyes:  Sclerae anicteric, conjunctiva pink. Ears: Normal auditory acuity Lungs: Clear throughout to auscultation; no increased WOB. Heart: Regular rate and rhythm Abdomen: Soft, non-distended.  Normal bowel sounds.  Non-tender. Rectal:  No external abnormalities identified.  DRE negative for masses.  Light brown stool on exam glove was hemoccult negative. Musculoskeletal: Symmetrical with no gross deformities  Skin: No lesions on visible extremities Extremities: No edema  Neurological: Alert oriented x 4, grossly non-focal Psychological:  Alert and cooperative. Normal mood and affect  ASSESSMENT AND PLAN: #1 Perianal itching/irritation:  There are absolutely no abnormalities seen on exam today.  Can continue zinc oxide product to perianal area prn. #2 IBS-C with constipation and chronic complaints of bloating/gas:  She is using Metamucil daily.  I suggested maybe switching to Benefiber or Citrucel instead to see if that cuts back on some of her gas/bloating.  Try IBgard prn. #3 GERD/gastroparesis  *Follow-up with Dr. Marina Goodell for any ongoing complaints as she has seen APP's several times over the past year.    CC:  Remus Loffler, PA-C

## 2016-03-03 NOTE — Patient Instructions (Signed)
Switch fiber supplement from Metamucil to Benefiber or Citrucel.   Please purchase the following medications over the counter and take as directed: IB gard as directed.   Continue Zinc Oxide on perianal area.

## 2016-03-08 ENCOUNTER — Encounter: Payer: Self-pay | Admitting: Gastroenterology

## 2016-03-09 DIAGNOSIS — R140 Abdominal distension (gaseous): Secondary | ICD-10-CM | POA: Insufficient documentation

## 2016-03-09 DIAGNOSIS — R141 Gas pain: Secondary | ICD-10-CM | POA: Insufficient documentation

## 2016-03-09 DIAGNOSIS — L290 Pruritus ani: Secondary | ICD-10-CM | POA: Insufficient documentation

## 2016-03-09 NOTE — Progress Notes (Signed)
Noted. This functional patient can see Dr. Henrene Pastor or APPs

## 2016-03-24 ENCOUNTER — Telehealth: Payer: Self-pay | Admitting: Internal Medicine

## 2016-03-24 NOTE — Telephone Encounter (Signed)
Pt reports she is not doing well. She is having problems with gas and had tried benefiber instead of metamucil. Suggested pt try phyzyme for gas and start the metamucil again. Her rectal area is better per the pt, not irritated. She reports she is having problems processing and pushing the stool out. States her ribs and hips even hurt where she is having to push the stool out. Discussed with pt that she might want to try stool softeners. Pt also states her reflux is worse since starting the IB [place]. Pt instructed to stop the IB gard as she does not think it is helping and making reflux worse. Pt states she will try these suggestions and call back if no better.

## 2016-04-14 ENCOUNTER — Ambulatory Visit: Payer: BLUE CROSS/BLUE SHIELD | Admitting: Internal Medicine

## 2016-04-30 ENCOUNTER — Ambulatory Visit: Payer: BLUE CROSS/BLUE SHIELD | Admitting: Physician Assistant

## 2016-04-30 ENCOUNTER — Encounter: Payer: Self-pay | Admitting: Physician Assistant

## 2016-04-30 ENCOUNTER — Ambulatory Visit (INDEPENDENT_AMBULATORY_CARE_PROVIDER_SITE_OTHER): Payer: BLUE CROSS/BLUE SHIELD | Admitting: Physician Assistant

## 2016-04-30 VITALS — BP 118/78 | HR 83 | Temp 96.5°F | Ht 60.0 in | Wt 109.0 lb

## 2016-04-30 DIAGNOSIS — B356 Tinea cruris: Secondary | ICD-10-CM | POA: Diagnosis not present

## 2016-04-30 DIAGNOSIS — M1990 Unspecified osteoarthritis, unspecified site: Secondary | ICD-10-CM | POA: Diagnosis not present

## 2016-04-30 DIAGNOSIS — J0110 Acute frontal sinusitis, unspecified: Secondary | ICD-10-CM

## 2016-04-30 DIAGNOSIS — B379 Candidiasis, unspecified: Secondary | ICD-10-CM

## 2016-04-30 DIAGNOSIS — Z0000 Encounter for general adult medical examination without abnormal findings: Secondary | ICD-10-CM

## 2016-04-30 DIAGNOSIS — R739 Hyperglycemia, unspecified: Secondary | ICD-10-CM | POA: Diagnosis not present

## 2016-04-30 LAB — BAYER DCA HB A1C WAIVED: HB A1C (BAYER DCA - WAIVED): 5.4 % (ref <7.0)

## 2016-04-30 MED ORDER — FLUCONAZOLE 150 MG PO TABS: 150.0000 mg | ORAL_TABLET | ORAL | 11 refills | Status: DC

## 2016-04-30 MED ORDER — AZITHROMYCIN 250 MG PO TABS: ORAL_TABLET | ORAL | 0 refills | Status: DC

## 2016-04-30 NOTE — Patient Instructions (Signed)
Arthritis Arthritis means joint pain. It can also mean joint disease. A joint is a place where bones come together. People who have arthritis may have:  Red joints.  Swollen joints.  Stiff joints.  Warm joints.  A fever.  A feeling of being sick. Follow these instructions at home: Pay attention to any changes in your symptoms. Take these actions to help with your pain and swelling. Medicines   Take over-the-counter and prescription medicines only as told by your doctor.  Do not take aspirin for pain if your doctor says that you may have gout. Activity   Rest your joint if your doctor tells you to.  Avoid activities that make the pain worse.  Exercise your joint regularly as told by your doctor. Try doing exercises like:  Swimming.  Water aerobics.  Biking.  Walking. Joint Care    If your joint is swollen, keep it raised (elevated) if told by your doctor.  If your joint feels stiff in the morning, try taking a warm shower.  If you have diabetes, do not apply heat without asking your doctor.  If told, apply heat to the joint:  Put a towel between the joint and the hot pack or heating pad.  Leave the heat on the area for 20-30 minutes.  If told, apply ice to the joint:  Put ice in a plastic bag.  Place a towel between your skin and the bag.  Leave the ice on for 20 minutes, 2-3 times per day.  Keep all follow-up visits as told by your doctor. Contact a doctor if:  The pain gets worse.  You have a fever. Get help right away if:  You have very bad pain in your joint.  You have swelling in your joint.  Your joint is red.  Many joints become painful and swollen.  You have very bad back pain.  Your leg is very weak.  You cannot control your pee (urine) or poop (stool). This information is not intended to replace advice given to you by your health care provider. Make sure you discuss any questions you have with your health care  provider. Document Released: 03/17/2009 Document Revised: 05/29/2015 Document Reviewed: 03/18/2014 Elsevier Interactive Patient Education  2017 [employer]  

## 2016-04-30 NOTE — Progress Notes (Signed)
BP 118/78   Pulse 83   Temp (!) 96.5 F (35.8 C) (Oral)   Ht 5' (1.524 m)   Wt 109 lb (49.4 kg)   BMI 21.29 kg/m    Subjective:    Patient ID: Ruth Terry, female    DOB: 1955-03-17, 61 y.o.   MRN: [id number]  HPI: Ruth Terry is a 61 y.o. female presenting on 04/30/2016 for mouth irritation and joints aching all over  This patient has had many days of sinus headache and postnasal drainage. There is copious drainage at times. Denies any fever at this time. There has been a history of sinus infections in the past.  No history of sinus surgery. There is cough at night. It has become more prevalent in recent days.  Relevant past medical, surgical, family and social history reviewed and updated as indicated. Allergies and medications reviewed and updated.  Past Medical History:  Diagnosis Date  . Anal fissure   . Chronic constipation   . Diverticulosis 04-20-10   colonoscopy  . Gastroparesis   . GERD (gastroesophageal reflux disease)   . Hemorrhoids   . Hyperlipidemia   . IBS (irritable bowel syndrome)   . MVP (mitral valve prolapse)   . Polyp, stomach 04-20-10   egd  . Shingles   . UTI (lower urinary tract infection)     No past surgical history on file.  Review of Systems  Constitutional: Positive for chills and fatigue. Negative for activity change and appetite change.  HENT: Positive for congestion, postnasal drip and sore throat.   Eyes: Negative.   Respiratory: Positive for cough and wheezing.   Cardiovascular: Negative.  Negative for chest pain, palpitations and leg swelling.  Gastrointestinal: Negative.   Genitourinary: Negative.   Musculoskeletal: Negative.   Skin: Negative.   Neurological: Positive for headaches.    Allergies as of 04/30/2016      Reactions   Chlordiazepoxide-clidinium Itching   Ciprofloxacin Itching   Irritates stomach   Flagyl [metronidazole]    Severe yeast infection   Sulfa Drugs Cross Reactors Nausea Only   Nitrofurantoin  Monohyd Macro Rash      Medication List       Accurate as of 04/30/16  6:42 PM. Always use your most recent med list.          albuterol 108 (90 Base) MCG/ACT inhaler Commonly known as:  PROVENTIL HFA;VENTOLIN HFA Inhale 2 puffs into the lungs every 6 (six) hours as needed for wheezing or shortness of breath.   ALIGN 4 MG Caps Take 1 capsule by mouth daily.   AMBULATORY NON FORMULARY MEDICATION Domperidone 10mg  take one at bedtime   azithromycin 250 MG tablet Commonly known as:  ZITHROMAX Z-PAK Take as directed   dexlansoprazole 60 MG capsule Commonly known as:  DEXILANT Take 1 capsule (60 mg total) by mouth daily.   dicyclomine 10 MG capsule Commonly known as:  BENTYL Take one po TID prn   fluconazole 150 MG tablet Commonly known as:  DIFLUCAN Take 1 tablet (150 mg total) by mouth once a week.   hydrocortisone 25 MG suppository Commonly known as:  ANUSOL-HC Place 1 suppository (25 mg total) rectally 2 (two) times daily.   linaclotide 145 MCG Caps capsule Commonly known as:  LINZESS TAKE ONE (1) CAPSULE EACH DAY   polyethylene glycol powder powder Commonly known as:  GLYCOLAX/MIRALAX Take 17 g by mouth as needed.   psyllium 58.6 % packet Commonly known as:  METAMUCIL Take 1 packet  by mouth daily.   Simethicone 250 MG Caps Commonly known as:  PHAZYME MAXIMUM STRENGTH Take 250 mg by mouth 3 (three) times daily.   URIBEL 118 MG Caps Take 1 capsule by mouth as needed.   valACYclovir 1000 MG tablet Commonly known as:  VALTREX Take 1,000 mg by mouth at bedtime.          Objective:    BP 118/78   Pulse 83   Temp (!) 96.5 F (35.8 C) (Oral)   Ht 5' (1.524 m)   Wt 109 lb (49.4 kg)   BMI 21.29 kg/m   Allergies  Allergen Reactions  . Chlordiazepoxide-Clidinium Itching  . Ciprofloxacin Itching    Irritates stomach  . Flagyl [Metronidazole]     Severe yeast infection  . Sulfa Drugs Cross Reactors Nausea Only  . Nitrofurantoin Monohyd Macro Rash      Physical Exam  Constitutional: She is oriented to person, place, and time. She appears well-developed and well-nourished.  HENT:  Head: Normocephalic and atraumatic.  Right Ear: Tympanic membrane and external ear normal. No middle ear effusion.  Left Ear: Tympanic membrane and external ear normal.  No middle ear effusion.  Nose: Mucosal edema and rhinorrhea present. Right sinus exhibits no maxillary sinus tenderness. Left sinus exhibits no maxillary sinus tenderness.  Mouth/Throat: Uvula is midline. Posterior oropharyngeal erythema present.  Eyes: Conjunctivae and EOM are normal. Pupils are equal, round, and reactive to light. Right eye exhibits no discharge. Left eye exhibits no discharge.  Neck: Normal range of motion.  Cardiovascular: Normal rate, regular rhythm and normal heart sounds.   Pulmonary/Chest: Effort normal and breath sounds normal. No respiratory distress. She has no wheezes.  Abdominal: Soft.  Lymphadenopathy:    She has no cervical adenopathy.  Neurological: She is alert and oriented to person, place, and time.  Skin: Skin is warm and dry.  Psychiatric: She has a normal mood and affect.    Results for orders placed or performed in visit on 04/30/16  Bayer DCA Hb A1c Waived  Result Value Ref Range   Bayer DCA Hb A1c Waived 5.4 <7.0 %      Assessment & Plan:   1. Acute non-recurrent frontal sinusitis - azithromycin (ZITHROMAX Z-PAK) 250 MG tablet; Take as directed  Dispense: 6 each; Refill: 0  2. Infection due to yeast - fluconazole (DIFLUCAN) 150 MG tablet; Take 1 tablet (150 mg total) by mouth once a week.  Dispense: 4 tablet; Refill: 11  3. Arthritis - Arthritis Panel  4. Tinea cruris - fluconazole (DIFLUCAN) 150 MG tablet; Take 1 tablet (150 mg total) by mouth once a week.  Dispense: 4 tablet; Refill: 11  5. Well adult exam - CMP14+EGFR - CBC with Differential/Platelet - Lipid panel - Bayer DCA Hb A1c Waived  6. Hyperglycemia - Bayer DCA Hb A1c  Waived   Current Outpatient Prescriptions:  .  albuterol (PROVENTIL HFA;VENTOLIN HFA) 108 (90 Base) MCG/ACT inhaler, Inhale 2 puffs into the lungs every 6 (six) hours as needed for wheezing or shortness of breath., Disp: 1 Inhaler, Rfl: 1 .  AMBULATORY NON FORMULARY MEDICATION, Domperidone 10mg  take one at bedtime, Disp: 100 tablet, Rfl: 3 .  dexlansoprazole (DEXILANT) 60 MG capsule, Take 1 capsule (60 mg total) by mouth daily., Disp: 90 capsule, Rfl: 3 .  dicyclomine (BENTYL) 10 MG capsule, Take one po TID prn, Disp: 90 capsule, Rfl: 0 .  fluconazole (DIFLUCAN) 150 MG tablet, Take 1 tablet (150 mg total) by mouth once a week.,  Disp: 4 tablet, Rfl: 11 .  hydrocortisone (ANUSOL-HC) 25 MG suppository, Place 1 suppository (25 mg total) rectally 2 (two) times daily., Disp: 12 suppository, Rfl: 0 .  linaclotide (LINZESS) 145 MCG CAPS capsule, TAKE ONE (1) CAPSULE EACH DAY, Disp: 90 capsule, Rfl: 3 .  Meth-Hyo-M Bl-Na Phos-Ph Sal (URIBEL) 118 MG CAPS, Take 1 capsule by mouth as needed., Disp: , Rfl:  .  polyethylene glycol powder (GLYCOLAX/MIRALAX) powder, Take 17 g by mouth as needed. , Disp: , Rfl:  .  Probiotic Product (ALIGN) 4 MG CAPS, Take 1 capsule by mouth daily., Disp: , Rfl:  .  psyllium (METAMUCIL) 58.6 % packet, Take 1 packet by mouth daily., Disp: , Rfl:  .  Simethicone (PHAZYME MAXIMUM STRENGTH) 250 MG CAPS, Take 250 mg by mouth 3 (three) times daily., Disp: 14 capsule, Rfl:  .  valACYclovir (VALTREX) 1000 MG tablet, Take 1,000 mg by mouth at bedtime. , Disp: , Rfl: 4 .  azithromycin (ZITHROMAX Z-PAK) 250 MG tablet, Take as directed, Disp: 6 each, Rfl: 0  Continue all other maintenance medications as listed above.  Follow up plan: Return if symptoms worsen or fail to improve.  Educational handout given for arthritis  Remus Loffler PA-C [facility] [address]  [place], [place] [address] [phone]   04/30/2016, 6:42 PM

## 2016-05-01 LAB — ARTHRITIS PANEL
BASOS ABS: 0 10*3/uL (ref 0.0–0.2)
Basos: 0 %
EOS (ABSOLUTE): 0.1 10*3/uL (ref 0.0–0.4)
EOS: 3 %
HEMATOCRIT: 40 % (ref 34.0–46.6)
Hemoglobin: 13.7 g/dL (ref 11.1–15.9)
IMMATURE GRANULOCYTES: 0 %
Immature Grans (Abs): 0 10*3/uL (ref 0.0–0.1)
Lymphocytes Absolute: 2 10*3/uL (ref 0.7–3.1)
Lymphs: 39 %
MCH: 30.5 pg (ref 26.6–33.0)
MCHC: 34.3 g/dL (ref 31.5–35.7)
MCV: 89 fL (ref 79–97)
MONOS ABS: 0.4 10*3/uL (ref 0.1–0.9)
Monocytes: 7 %
NEUTROS PCT: 51 %
Neutrophils Absolute: 2.6 10*3/uL (ref 1.4–7.0)
PLATELETS: 271 10*3/uL (ref 150–379)
RBC: 4.49 x10E6/uL (ref 3.77–5.28)
RDW: 14.4 % (ref 12.3–15.4)
Rhuematoid fact SerPl-aCnc: <10 IU/mL (ref 0.0–13.9)
Sed Rate: 2 mm/hr (ref 0–40)
Uric Acid: 6.3 mg/dL (ref 2.5–7.1)
WBC: 5.1 10*3/uL (ref 3.4–10.8)

## 2016-05-05 LAB — CMP14+EGFR
ALT: 10 IU/L (ref 0–32)
AST: 11 IU/L (ref 0–40)
Albumin/Globulin Ratio: 1.8 (ref 1.2–2.2)
Albumin: 4.1 g/dL (ref 3.5–5.5)
Alkaline Phosphatase: 75 IU/L (ref 39–117)
BUN / CREAT RATIO: 13 (ref 9–23)
BUN: 8 mg/dL (ref 6–24)
Bilirubin Total: <0.2 mg/dL (ref 0.0–1.2)
CALCIUM: 9.6 mg/dL (ref 8.7–10.2)
CO2: 24 mmol/L (ref 18–29)
Chloride: 101 mmol/L (ref 96–106)
Creatinine, Ser: 0.64 mg/dL (ref 0.57–1.00)
GFR calc Af Amer: 114 mL/min/{1.73_m2} (ref >59)
GFR calc non Af Amer: 99 mL/min/{1.73_m2} (ref >59)
Globulin, Total: 2.3 g/dL (ref 1.5–4.5)
Glucose: 85 mg/dL (ref 65–99)
POTASSIUM: 3.5 mmol/L (ref 3.5–5.2)
SODIUM: 142 mmol/L (ref 134–144)
Total Protein: 6.4 g/dL (ref 6.0–8.5)

## 2016-05-05 LAB — SPECIMEN STATUS REPORT

## 2016-05-05 LAB — LIPID PANEL
CHOL/HDL RATIO: 3.9 ratio (ref 0.0–4.4)
CHOLESTEROL TOTAL: 229 mg/dL — AB (ref 100–199)
HDL: 59 mg/dL (ref >39)
LDL Calculated: 108 mg/dL — ABNORMAL HIGH (ref 0–99)
TRIGLYCERIDES: 309 mg/dL — AB (ref 0–149)
VLDL Cholesterol Cal: 62 mg/dL — ABNORMAL HIGH (ref 5–40)

## 2016-05-07 ENCOUNTER — Ambulatory Visit: Payer: BLUE CROSS/BLUE SHIELD | Admitting: Physician Assistant

## 2016-05-17 ENCOUNTER — Ambulatory Visit: Payer: BLUE CROSS/BLUE SHIELD | Admitting: Internal Medicine

## 2016-07-01 DIAGNOSIS — H578 Other specified disorders of eye and adnexa: Secondary | ICD-10-CM | POA: Diagnosis not present

## 2016-07-29 ENCOUNTER — Ambulatory Visit (INDEPENDENT_AMBULATORY_CARE_PROVIDER_SITE_OTHER): Payer: BLUE CROSS/BLUE SHIELD | Admitting: Nurse Practitioner

## 2016-07-29 ENCOUNTER — Encounter: Payer: Self-pay | Admitting: Nurse Practitioner

## 2016-07-29 VITALS — BP 122/77 | HR 75 | Temp 96.7°F | Ht 60.0 in | Wt 107.0 lb

## 2016-07-29 DIAGNOSIS — L739 Follicular disorder, unspecified: Secondary | ICD-10-CM

## 2016-07-29 MED ORDER — DOXYCYCLINE HYCLATE 100 MG PO TABS: 100.0000 mg | ORAL_TABLET | Freq: Two times a day (BID) | ORAL | 0 refills | Status: DC

## 2016-07-29 NOTE — Patient Instructions (Signed)

## 2016-07-29 NOTE — Progress Notes (Signed)
Subjective:    Patient ID: Ruth Terry, female    DOB: [birth year], 61 y.o.   MRN: [id number]  HPI Patient in the office today complaining of a burning sensation in her left groin that occurred yesterday when standing up from her desk.  Patient states the pain was immediate and brought on nausea.  She states the pain has not been reproducible until just now when standing from the chair in the office.  Patient takes a medication for her gastroparesis that she has just restarted on the last week and didn't know if the sensation was being caused by constipation.  Patient has had shingles before and says this does not feel like her previous shingles rash.    She does have an inflamed hair follicle in her left groin area that is tender to touch and she states that on palpation, the same burning sensation was repeated.   Review of Systems  Constitutional: Negative for activity change and fever.  Skin:       Erythematous hair follicle in left groin.   All other systems reviewed and are negative.      Objective:   Physical Exam  Constitutional: She appears well-developed. No distress.  Cardiovascular: Normal rate and regular rhythm.   Pulmonary/Chest: Effort normal and breath sounds normal.  Skin: Skin is warm and dry. There is erythema (raised lesion about 1cm annular left groin area.).  No left groin lymphadenopathy    BP 122/77   Pulse 75   Temp (!) 96.7 F (35.9 C) (Oral)   Ht 5' (1.524 m)   Wt 107 lb (48.5 kg)   BMI 20.90 kg/m        Assessment & Plan:  1. Acute folliculitis Do not pick or scratch at area RTO if does not improve - doxycycline (VIBRA-TABS) 100 MG tablet; Take 1 tablet (100 mg total) by mouth 2 (two) times daily. 1 po bid  Dispense: 20 tablet; Refill: 0  Mary-Margaret Daphine Deutscher, FNP

## 2016-07-30 ENCOUNTER — Ambulatory Visit (INDEPENDENT_AMBULATORY_CARE_PROVIDER_SITE_OTHER): Payer: BLUE CROSS/BLUE SHIELD | Admitting: Physician Assistant

## 2016-07-30 ENCOUNTER — Encounter: Payer: Self-pay | Admitting: Physician Assistant

## 2016-07-30 VITALS — BP 130/84 | HR 85 | Temp 97.6°F | Ht 60.0 in | Wt 107.0 lb

## 2016-07-30 DIAGNOSIS — L739 Follicular disorder, unspecified: Secondary | ICD-10-CM | POA: Diagnosis not present

## 2016-07-30 MED ORDER — PREDNISONE 10 MG (21) PO TBPK: ORAL_TABLET | ORAL | 0 refills | Status: DC

## 2016-07-30 NOTE — Progress Notes (Signed)
BP 130/84   Pulse 85   Temp 97.6 F (36.4 C) (Oral)   Ht 5' (1.524 m)   Wt 107 lb (48.5 kg)   BMI 20.90 kg/m    Subjective:    Patient ID: Ruth Terry, female    DOB: 08/20/1955, 61 y.o.   MRN: [id number]  HPI: Ruth Terry is a 61 y.o. female presenting on 07/30/2016 for folliculitis  The patient comes in today complaining of an area in her left groin that is very tender to the touch and tender when she sits and stands. She does shave in this area and the hair is growing out. We are concerned about a folliculitis. She was seen yesterday and started on antibiotic. She continues with significant pain in the area. She states she felt somewhat weak when she took the antibiotic. I've encouraged her to try to take it on a full stomach. She is also instructed to use heat to the area to see if this will soothe some of the pain.  Relevant past medical, surgical, family and social history reviewed and updated as indicated. Allergies and medications reviewed and updated.  Past Medical History:  Diagnosis Date  . Anal fissure   . Chronic constipation   . Diverticulosis 04-20-10   colonoscopy  . Gastroparesis   . GERD (gastroesophageal reflux disease)   . Hemorrhoids   . Hyperlipidemia   . IBS (irritable bowel syndrome)   . MVP (mitral valve prolapse)   . Polyp, stomach 04-20-10   egd  . Shingles   . UTI (lower urinary tract infection)     No past surgical history on file.  Review of Systems  Constitutional: Negative.   HENT: Negative.   Eyes: Negative.   Respiratory: Negative.   Gastrointestinal: Negative.   Genitourinary: Negative.   Skin: Positive for rash and wound.    Allergies as of 07/30/2016      Reactions   Chlordiazepoxide-clidinium Itching   Ciprofloxacin Itching   Irritates stomach   Flagyl [metronidazole]    Severe yeast infection   Sulfa Drugs Cross Reactors Nausea Only   Nitrofurantoin Monohyd Macro Rash      Medication List       Accurate as of  07/30/16 10:39 AM. Always use your most recent med list.          ALIGN 4 MG Caps Take 1 capsule by mouth daily.   AMBULATORY NON FORMULARY MEDICATION Medication Name: domperidome 10 mg q hs   dexlansoprazole 60 MG capsule Commonly known as:  DEXILANT Take 1 capsule (60 mg total) by mouth daily.   doxycycline 100 MG tablet Commonly known as:  VIBRA-TABS Take 1 tablet (100 mg total) by mouth 2 (two) times daily. 1 po bid   linaclotide 145 MCG Caps capsule Commonly known as:  LINZESS TAKE ONE (1) CAPSULE EACH DAY   polyethylene glycol powder powder Commonly known as:  GLYCOLAX/MIRALAX Take 17 g by mouth as needed.   predniSONE 10 MG (21) Tbpk tablet Commonly known as:  STERAPRED UNI-PAK 21 TAB As directed x 6 days   psyllium 58.6 % packet Commonly known as:  METAMUCIL Take 1 packet by mouth daily.   Simethicone 250 MG Caps Commonly known as:  PHAZYME MAXIMUM STRENGTH Take 250 mg by mouth 3 (three) times daily.   valACYclovir 1000 MG tablet Commonly known as:  VALTREX Take 1,000 mg by mouth at bedtime.          Objective:    BP  130/84   Pulse 85   Temp 97.6 F (36.4 C) (Oral)   Ht 5' (1.524 m)   Wt 107 lb (48.5 kg)   BMI 20.90 kg/m   Allergies  Allergen Reactions  . Chlordiazepoxide-Clidinium Itching  . Ciprofloxacin Itching    Irritates stomach  . Flagyl [Metronidazole]     Severe yeast infection  . Sulfa Drugs Cross Reactors Nausea Only  . Nitrofurantoin Monohyd Macro Rash    Physical Exam  Constitutional: She is oriented to person, place, and time. She appears well-developed and well-nourished.  HENT:  Head: Normocephalic and atraumatic.  Eyes: Pupils are equal, round, and reactive to light. Conjunctivae and EOM are normal.  Cardiovascular: Normal rate, regular rhythm, normal heart sounds and intact distal pulses.   Pulmonary/Chest: Effort normal and breath sounds normal.  Abdominal: Soft. Bowel sounds are normal.  Neurological: She is alert  and oriented to person, place, and time. She has normal reflexes.  Skin: Skin is warm and dry. No rash noted.  Small pink lesion in the groin, tender to touch, no induration, no lymphadenopathy  Psychiatric: She has a normal mood and affect. Her behavior is normal. Judgment and thought content normal.    Results for orders placed or performed in visit on 04/30/16  Arthritis Panel  Result Value Ref Range   Uric Acid 6.3 2.5 - 7.1 mg/dL   Rhuematoid fact SerPl-aCnc <10.0 0.0 - 13.9 IU/mL   WBC 5.1 3.4 - 10.8 x10E3/uL   RBC 4.49 3.77 - 5.28 x10E6/uL   Hemoglobin 13.7 11.1 - 15.9 g/dL   Hematocrit 09.8 11.9 - 46.6 %   MCV 89 79 - 97 fL   MCH 30.5 26.6 - 33.0 pg   MCHC 34.3 31.5 - 35.7 g/dL   RDW 14.7 82.9 - 56.2 %   Platelets 271 150 - 379 x10E3/uL   Neutrophils 51 Not Estab. %   Lymphs 39 Not Estab. %   Monocytes 7 Not Estab. %   Eos 3 Not Estab. %   Basos 0 Not Estab. %   Neutrophils Absolute 2.6 1.4 - 7.0 x10E3/uL   Lymphocytes Absolute 2.0 0.7 - 3.1 x10E3/uL   Monocytes Absolute 0.4 0.1 - 0.9 x10E3/uL   EOS (ABSOLUTE) 0.1 0.0 - 0.4 x10E3/uL   Basophils Absolute 0.0 0.0 - 0.2 x10E3/uL   Immature Granulocytes 0 Not Estab. %   Immature Grans (Abs) 0.0 0.0 - 0.1 x10E3/uL   Sed Rate 2 0 - 40 mm/hr  Bayer DCA Hb A1c Waived  Result Value Ref Range   Bayer DCA Hb A1c Waived 5.4 <7.0 %  CMP14+EGFR  Result Value Ref Range   Glucose 85 65 - 99 mg/dL   BUN 8 6 - 24 mg/dL   Creatinine, Ser 1.30 0.57 - 1.00 mg/dL   GFR calc non Af Amer 99 >59 mL/min/1.73   GFR calc Af Amer 114 >59 mL/min/1.73   BUN/Creatinine Ratio 13 9 - 23   Sodium 142 134 - 144 mmol/L   Potassium 3.5 3.5 - 5.2 mmol/L   Chloride 101 96 - 106 mmol/L   CO2 24 18 - 29 mmol/L   Calcium 9.6 8.7 - 10.2 mg/dL   Total Protein 6.4 6.0 - 8.5 g/dL   Albumin 4.1 3.5 - 5.5 g/dL   Globulin, Total 2.3 1.5 - 4.5 g/dL   Albumin/Globulin Ratio 1.8 1.2 - 2.2   Bilirubin Total <0.2 0.0 - 1.2 mg/dL   Alkaline Phosphatase 75 39 -  117 IU/L   AST  11 0 - 40 IU/L   ALT 10 0 - 32 IU/L  Lipid panel  Result Value Ref Range   Cholesterol, Total 229 (H) 100 - 199 mg/dL   Triglycerides 161 (H) 0 - 149 mg/dL   HDL 59 >09 mg/dL   VLDL Cholesterol Cal 62 (H) 5 - 40 mg/dL   LDL Calculated 604 (H) 0 - 99 mg/dL   Chol/HDL Ratio 3.9 0.0 - 4.4 ratio  Specimen status report  Result Value Ref Range   specimen status report Comment       Assessment & Plan:   1. Folliculitis Continue doxycycline Warm compresses Avoid shaving  Continue all other maintenance medications as listed above.  Follow up plan: Return if symptoms worsen or fail to improve.  Educational handout given for       Remus Loffler PA-C [facility] [address]  [place], [place] [address] [phone]   07/30/2016, 10:39 AM

## 2016-07-30 NOTE — Patient Instructions (Signed)
In a few days you may receive a survey in the mail or online from [employer] regarding your visit with us today. Please take a moment to fill this out. Your feedback is very important to our whole office. It can help us better understand your needs as well as improve your experience and satisfaction. Thank you for taking your time to complete it. We care about you.  Brynli Ollis, PA-C  

## 2016-08-02 ENCOUNTER — Telehealth: Payer: Self-pay | Admitting: Physician Assistant

## 2016-08-03 ENCOUNTER — Ambulatory Visit: Payer: BLUE CROSS/BLUE SHIELD

## 2016-08-03 NOTE — Telephone Encounter (Signed)
Spoke with patient and she states she is constipated and wants to know what she can try? Patient also states that she is still having trouble with her leg. She states that the spot is gone but she is still having pain and discomfort in that leg. She states she finishes antibiotic and prednisone today.

## 2016-08-03 NOTE — Telephone Encounter (Signed)
Patient advised of recommendation and verbalizes understanding.

## 2016-08-03 NOTE — Telephone Encounter (Signed)
Miralax, 1 capful in water or juice daily to relieve constipation.  Time and continue the gabapentin to have the nerve heal.

## 2016-08-11 ENCOUNTER — Ambulatory Visit (INDEPENDENT_AMBULATORY_CARE_PROVIDER_SITE_OTHER): Payer: BLUE CROSS/BLUE SHIELD | Admitting: Physician Assistant

## 2016-08-11 ENCOUNTER — Encounter: Payer: Self-pay | Admitting: Physician Assistant

## 2016-08-11 VITALS — BP 128/75 | HR 88 | Temp 97.6°F | Ht 60.0 in | Wt 108.0 lb

## 2016-08-11 DIAGNOSIS — R300 Dysuria: Secondary | ICD-10-CM | POA: Diagnosis not present

## 2016-08-11 DIAGNOSIS — R1084 Generalized abdominal pain: Secondary | ICD-10-CM

## 2016-08-11 DIAGNOSIS — K581 Irritable bowel syndrome with constipation: Secondary | ICD-10-CM

## 2016-08-11 DIAGNOSIS — K3184 Gastroparesis: Secondary | ICD-10-CM

## 2016-08-11 LAB — URINALYSIS, COMPLETE
Bilirubin, UA: NEGATIVE
GLUCOSE, UA: NEGATIVE
KETONES UA: NEGATIVE
Leukocytes, UA: NEGATIVE
NITRITE UA: NEGATIVE
Protein, UA: NEGATIVE
RBC, UA: NEGATIVE
SPEC GRAV UA: 1.025 (ref 1.005–1.030)
UUROB: 0.2 mg/dL (ref 0.2–1.0)
pH, UA: 5.5 (ref 5.0–7.5)

## 2016-08-11 LAB — MICROSCOPIC EXAMINATION: Renal Epithel, UA: NONE SEEN /hpf

## 2016-08-11 MED ORDER — NYSTATIN 100000 UNIT/ML MT SUSP: 5.0000 mL | Freq: Four times a day (QID) | OROMUCOSAL | 0 refills | Status: DC

## 2016-08-11 NOTE — Patient Instructions (Signed)
In a few days you may receive a survey in the mail or online from [employer] regarding your visit with us today. Please take a moment to fill this out. Your feedback is very important to our whole office. It can help us better understand your needs as well as improve your experience and satisfaction. Thank you for taking your time to complete it. We care about you.  Maylani Embree, PA-C  

## 2016-08-12 LAB — URINE CULTURE: ORGANISM ID, BACTERIA: NO GROWTH

## 2016-08-13 NOTE — Progress Notes (Signed)
BP 128/75   Pulse 88   Temp 97.6 F (36.4 C) (Oral)   Ht 5' (1.524 m)   Wt 108 lb (49 kg)   BMI 21.09 kg/m    Subjective:    Patient ID: Ruth Terry, female    DOB: 03/28/1955, 61 y.o.   MRN: [id number]  HPI: Ruth Terry is a 61 y.o. female presenting on 08/11/2016 for Back Pain (radiates to abdomen ) and Abdominal Pain  The pain comes from her low back. It radiates through her abdomen and around the tops of both hips. She does work in a daycare. She does help take care of her husband. She states she has felt quite tense in this area. She has had a history of urinary tract infections and was concerned that this could be positive. The urinalysis was normal in the office. She states she has had some more yeast and coating on her tongue. She has been on multiple antibiotics recently.  Relevant past medical, surgical, family and social history reviewed and updated as indicated. Allergies and medications reviewed and updated.  Past Medical History:  Diagnosis Date  . Anal fissure   . Chronic constipation   . Diverticulosis 04-20-10   colonoscopy  . Gastroparesis   . GERD (gastroesophageal reflux disease)   . Hemorrhoids   . Hyperlipidemia   . IBS (irritable bowel syndrome)   . MVP (mitral valve prolapse)   . Polyp, stomach 04-20-10   egd  . Shingles   . UTI (lower urinary tract infection)     History reviewed. No pertinent surgical history.  Review of Systems  Constitutional: Negative.  Negative for activity change, fatigue and fever.  HENT: Positive for sore throat. Negative for trouble swallowing and voice change.   Eyes: Negative.   Respiratory: Negative.  Negative for cough.   Cardiovascular: Negative.  Negative for chest pain.  Gastrointestinal: Negative.  Negative for abdominal pain.  Endocrine: Negative.   Genitourinary: Negative.  Negative for dysuria, frequency, hematuria and urgency.  Musculoskeletal: Positive for arthralgias and back pain.  Skin:  Negative.   Neurological: Negative.     Allergies as of 08/11/2016      Reactions   Chlordiazepoxide-clidinium Itching   Ciprofloxacin Itching   Irritates stomach   Flagyl [metronidazole]    Severe yeast infection   Sulfa Drugs Cross Reactors Nausea Only   Nitrofurantoin Monohyd Macro Rash      Medication List       Accurate as of 08/11/16 11:59 PM. Always use your most recent med list.          ALIGN 4 MG Caps Take 1 capsule by mouth daily.   AMBULATORY NON FORMULARY MEDICATION Medication Name: domperidome 10 mg q hs   dexlansoprazole 60 MG capsule Commonly known as:  DEXILANT Take 1 capsule (60 mg total) by mouth daily.   linaclotide 145 MCG Caps capsule Commonly known as:  LINZESS TAKE ONE (1) CAPSULE EACH DAY   nystatin 100000 UNIT/ML suspension Commonly known as:  MYCOSTATIN Take 5 mLs (500,000 Units total) by mouth 4 (four) times daily.   polyethylene glycol powder powder Commonly known as:  GLYCOLAX/MIRALAX Take 17 g by mouth as needed.   psyllium 58.6 % packet Commonly known as:  METAMUCIL Take 1 packet by mouth daily.   Simethicone 250 MG Caps Commonly known as:  PHAZYME MAXIMUM STRENGTH Take 250 mg by mouth 3 (three) times daily.   valACYclovir 1000 MG tablet Commonly known as:  VALTREX Take  1,000 mg by mouth at bedtime.          Objective:    BP 128/75   Pulse 88   Temp 97.6 F (36.4 C) (Oral)   Ht 5' (1.524 m)   Wt 108 lb (49 kg)   BMI 21.09 kg/m   Allergies  Allergen Reactions  . Chlordiazepoxide-Clidinium Itching  . Ciprofloxacin Itching    Irritates stomach  . Flagyl [Metronidazole]     Severe yeast infection  . Sulfa Drugs Cross Reactors Nausea Only  . Nitrofurantoin Monohyd Macro Rash    Physical Exam  Constitutional: She is oriented to person, place, and time. She appears well-developed and well-nourished.  HENT:  Head: Normocephalic and atraumatic.  Slight white exudate on the tongue with slight erythema.  Eyes:  Pupils are equal, round, and reactive to light. Conjunctivae and EOM are normal.  Cardiovascular: Normal rate, regular rhythm, normal heart sounds and intact distal pulses.   Pulmonary/Chest: Effort normal and breath sounds normal.  Abdominal: Soft. Bowel sounds are normal.  Neurological: She is alert and oriented to person, place, and time. She has normal reflexes.  Skin: Skin is warm and dry. No rash noted.  Psychiatric: She has a normal mood and affect. Her behavior is normal. Judgment and thought content normal.    Results for orders placed or performed in visit on 08/11/16  Urine Culture  Result Value Ref Range   Urine Culture, Routine Final report    Organism ID, Bacteria No growth   Microscopic Examination  Result Value Ref Range   WBC, UA 0-5 0 - 5 /hpf   RBC, UA 0-2 0 - 2 /hpf   Epithelial Cells (non renal) 0-10 0 - 10 /hpf   Renal Epithel, UA None seen None seen /hpf   Bacteria, UA Few None seen/Few  Urinalysis, Complete  Result Value Ref Range   Specific Gravity, UA 1.025 1.005 - 1.030   pH, UA 5.5 5.0 - 7.5   Color, UA Yellow Yellow   Appearance Ur Clear Clear   Leukocytes, UA Negative Negative   Protein, UA Negative Negative/Trace   Glucose, UA Negative Negative   Ketones, UA Negative Negative   RBC, UA Negative Negative   Bilirubin, UA Negative Negative   Urobilinogen, Ur 0.2 0.2 - 1.0 mg/dL   Nitrite, UA Negative Negative   Microscopic Examination See below:       Assessment & Plan:   1. Dysuria - Urine Culture - Urinalysis, Complete - Microscopic Examination  2. Generalized abdominal pain  3. Gastroparesis  4. Irritable bowel syndrome with constipation    Current Outpatient Prescriptions:  .  AMBULATORY NON FORMULARY MEDICATION, Medication Name: domperidome 10 mg q hs, Disp: , Rfl:  .  dexlansoprazole (DEXILANT) 60 MG capsule, Take 1 capsule (60 mg total) by mouth daily., Disp: 90 capsule, Rfl: 3 .  linaclotide (LINZESS) 145 MCG CAPS capsule,  TAKE ONE (1) CAPSULE EACH DAY, Disp: 90 capsule, Rfl: 3 .  polyethylene glycol powder (GLYCOLAX/MIRALAX) powder, Take 17 g by mouth as needed. , Disp: , Rfl:  .  Probiotic Product (ALIGN) 4 MG CAPS, Take 1 capsule by mouth daily., Disp: , Rfl:  .  psyllium (METAMUCIL) 58.6 % packet, Take 1 packet by mouth daily., Disp: , Rfl:  .  Simethicone (PHAZYME MAXIMUM STRENGTH) 250 MG CAPS, Take 250 mg by mouth 3 (three) times daily., Disp: 14 capsule, Rfl:  .  valACYclovir (VALTREX) 1000 MG tablet, Take 1,000 mg by mouth at bedtime. ,  Disp: , Rfl: 4 .  nystatin (MYCOSTATIN) 100000 UNIT/ML suspension, Take 5 mLs (500,000 Units total) by mouth 4 (four) times daily., Disp: 60 mL, Rfl: 0 Continue all other maintenance medications as listed above.  Follow up plan: Return if symptoms worsen or fail to improve.  Educational handout given for survey  Remus Loffler PA-C [facility] [address]  [place], [place] [address] [phone]   08/13/2016, 12:59 PM

## 2016-08-17 ENCOUNTER — Ambulatory Visit: Payer: BLUE CROSS/BLUE SHIELD | Admitting: Physician Assistant

## 2016-09-02 ENCOUNTER — Telehealth: Payer: Self-pay | Admitting: Internal Medicine

## 2016-09-02 NOTE — Telephone Encounter (Signed)
Pt states she has had a couple of rough months. Reports she was placed on doxycycline for a couple of months and it messed her stomach up. States she is having a lot of bloating and gas, feels bloated all the way up to her breast. States her stools are dark and she feels as if she is not emptying completely. Also reports her rectum is itchy and irritated. Pt requesting to be seen. Pt scheduled to see [name] Esterwood PA 09/07/16@2 :30pm, pt aware of appt.

## 2016-09-07 ENCOUNTER — Encounter: Payer: Self-pay | Admitting: Physician Assistant

## 2016-09-07 ENCOUNTER — Ambulatory Visit (INDEPENDENT_AMBULATORY_CARE_PROVIDER_SITE_OTHER): Payer: BLUE CROSS/BLUE SHIELD | Admitting: Physician Assistant

## 2016-09-07 ENCOUNTER — Telehealth: Payer: Self-pay | Admitting: *Deleted

## 2016-09-07 VITALS — BP 124/76 | HR 92 | Ht 60.0 in | Wt 110.2 lb

## 2016-09-07 DIAGNOSIS — K6389 Other specified diseases of intestine: Secondary | ICD-10-CM | POA: Diagnosis not present

## 2016-09-07 DIAGNOSIS — R140 Abdominal distension (gaseous): Secondary | ICD-10-CM | POA: Diagnosis not present

## 2016-09-07 DIAGNOSIS — R195 Other fecal abnormalities: Secondary | ICD-10-CM | POA: Diagnosis not present

## 2016-09-07 DIAGNOSIS — R143 Flatulence: Secondary | ICD-10-CM

## 2016-09-07 DIAGNOSIS — K581 Irritable bowel syndrome with constipation: Secondary | ICD-10-CM

## 2016-09-07 MED ORDER — LINACLOTIDE 72 MCG PO CAPS: ORAL_CAPSULE | ORAL | 8 refills | Status: DC

## 2016-09-07 NOTE — Patient Instructions (Addendum)
Please go to the basement level to have your labs drawn.  We have sent the following medications to your pharmacy for you to pick up at your convenience: [facility] [place], [place]. 1. Linzess 72 mcg  We have given you samples of the Xifaxan 550 mg, take 1 tab 3 times daily for 14 days.   You can get over the counter Balmex for rectal irritation.  Use 2-3 times daily.  We have provided you with a Low Gas Diet handout.   Dr. Blanch Media nurse will see that you get the refills for the Domperidone.  Her name is Vaughan Basta.

## 2016-09-07 NOTE — Telephone Encounter (Signed)
LM for Ruth Terry on the home answering machine. Asked her to call me regarding the dosage she take for the medication.

## 2016-09-07 NOTE — Progress Notes (Signed)
Subjective:    Patient ID: Ruth Terry, female    DOB: 1955-07-23, 61 y.o.   MRN: [id number]  HPI Ruth Terry is a pleasant 61 year old white female known to Dr. Marina Goodell who has history of IBS, constipation, nonulcer dyspepsia, GERD and gastroparesis. Her last office visit was in February 2018. She comes in today with complaints of significant increase in abdominal gas and bloating over the past couple of months, as well as increased belching. She is also concerned about recent intermittent dark stools. She has history of chronic constipation that is currently taking Linzess 145 g daily and says her stools are always loose on this medication. What  she calls constipation is not having a bowel movement for 1 day after she's had loose stools the day previous. She's not had any nausea or vomiting, appetite has been fine. She says she feels that her symptoms worsened after she took a course of doxycycline about 2 months ago. She also went for a time without her domperidone after she accidentally dropped the bottle into the commode. She's been back on domperidone over the past couple of weeks. EGD done in 2012 with finding of a GE junction polyp which was removed and a normal stomach, biopsy showed benign fundic gland polyp, small bowel biopsies were negative, s negative for H. Pylori.  Last colonoscopy April 2012 with mild sigmoid diverticulosis and no polyps.  Review of Systems Pertinent positive and negative review of systems were noted in the above HPI section.  All other review of systems was otherwise negative.  Outpatient Encounter Prescriptions as of 59/04/2016  Medication Sig  . AMBULATORY NON FORMULARY MEDICATION Medication Name: domperidome 10 mg q hs  . dexlansoprazole (DEXILANT) 60 MG capsule Take 1 capsule (60 mg total) by mouth daily.  Marland Kitchen linaclotide (LINZESS) 145 MCG CAPS capsule TAKE ONE (1) CAPSULE EACH DAY  . nystatin (MYCOSTATIN) 100000 UNIT/ML suspension Take 5 mLs (500,000 Units  total) by mouth 4 (four) times daily.  . polyethylene glycol powder (GLYCOLAX/MIRALAX) powder Take 17 g by mouth as needed.   . Probiotic Product (ALIGN) 4 MG CAPS Take 1 capsule by mouth daily.  . psyllium (METAMUCIL) 58.6 % packet Take 1 packet by mouth daily.  . Simethicone (PHAZYME MAXIMUM STRENGTH) 250 MG CAPS Take 250 mg by mouth 3 (three) times daily.  . valACYclovir (VALTREX) 1000 MG tablet Take 1,000 mg by mouth at bedtime.   Marland Kitchen linaclotide (LINZESS) 72 MCG capsule Take 1 tab daily.   No facility-administered encounter medications on file as of 09/07/2016.    Allergies  Allergen Reactions  . Chlordiazepoxide-Clidinium Itching  . Ciprofloxacin Itching    Irritates stomach  . Flagyl [Metronidazole]     Severe yeast infection  . Sulfa Drugs Cross Reactors Nausea Only  . Nitrofurantoin Monohyd Macro Rash   Patient Active Problem List   Diagnosis Date Noted  . Bloating 03/09/2016  . Gas pain 03/09/2016  . Anal itching 03/09/2016  . Acute recurrent frontal sinusitis 02/23/2016  . Tinea cruris 02/20/2016  . Elevated LDL cholesterol level 10/24/2015  . Internal hemorrhoids 05/14/2014  . GERD (gastroesophageal reflux disease) 09/17/2011  . Abdominal pain, epigastric 12/08/2010  . Stress 08/25/2010  . IBS (irritable bowel syndrome) 08/25/2010  . Chest pain, atypical 08/25/2010  . History of IBS 07/28/2010  . Gastroparesis 06/30/2010  . Drug reaction 06/30/2010  . Irritable bowel syndrome 04/03/2010  . Non-ulcer dyspepsia 04/03/2010   Social History   Social History  . Marital status: Married  Spouse name: N/A  . Number of children: 0  . Years of education: N/A   Occupational History  . Magazine features editor school program  .  [employer]   Social History Main Topics  . Smoking status: Former Smoker    Packs/day: 0.50    Years: 6.00    Types: Cigarettes  . Smokeless tobacco: Never Used     Comment: USE SMOKELESS CIGARETTES  . Alcohol use  0.0 oz/week     Comment: 1 a week  . Drug use: No  . Sexual activity: Not on file   Other Topics Concern  . Not on file   Social History Narrative  . No narrative on file    Ruth Terry's family history includes Diabetes in her father; Heart attack in her father; Hypertension in her mother.      Objective:    Vitals:   09/07/16 1422  BP: 124/76  Pulse: 92    Physical Exam  well-developed older white female in no acute distress, pleasant, blood pressure 124/76 pulse 92, BMI 24.1. HEENT; nontraumatic normocephalic EOMI PERRLA sclera anicteric, Cardiovascular; regular rate and rhythm with S1-S2 no murmur or gallop, Pulmonary; clear bilaterally, Abdomen ;soft, bowel sounds are active there is no focal tenderness no guarding or rebound no palpable mass or hepatosplenomegaly, no appreciable distention, Rectal ;exam no external erythema or obvious irritation, digital exam negative no stool for Hemoccult mucousy negative, Extremities ;no clubbing cyanosis or edema skin warm and dry, Neuropsych ;mood and affect appropriate       Assessment & Plan:   #87 61 year old female with history of IBS, chronic constipation nonulcer dyspepsia and gastroparesis who presents with increased gas and bloating over the last couple of months, intermittent dark stool Patient has previously been treated for small intestinal bacterial overgrowth with good response I suspect she may have recurrent SIBO, and in addition symptoms consistent with her usual dyspeptic and IBS symptomatology. #2 diverticulosis #3 colon cancer surveillance-up to date with negative colonoscopy in April 2012  Plan; we will decrease Linzess75 g daily, new prescription sent and samples given Low gas diet Gas-X or Phazyme before meals as needed Patient will be given a course of Xifaxan 550 mg by mouth 3 times a day 14 days. Previous attempt to get this covered by her insurance unsuccessful and she had taken metronidazole in the past  which made her very sick. She was given enough samples today to get through a course. She will complete Hemoccults, Follow-up with Dr. Marina Goodell or Ruth Terry on an as-needed basis.   Ruth Terry S Ruth Saefong PA-C 09/07/2016   Cc: Remus Loffler, PA-C

## 2016-09-08 ENCOUNTER — Other Ambulatory Visit: Payer: Self-pay | Admitting: *Deleted

## 2016-09-08 MED ORDER — AMBULATORY NON FORMULARY MEDICATION: 11 refills | Status: DC

## 2016-09-08 NOTE — Progress Notes (Signed)
Assessment and plans reviewed  

## 2016-09-10 ENCOUNTER — Encounter: Payer: Self-pay | Admitting: Physician Assistant

## 2016-09-10 ENCOUNTER — Telehealth: Payer: Self-pay | Admitting: *Deleted

## 2016-09-10 ENCOUNTER — Ambulatory Visit (INDEPENDENT_AMBULATORY_CARE_PROVIDER_SITE_OTHER): Payer: BLUE CROSS/BLUE SHIELD | Admitting: Physician Assistant

## 2016-09-10 VITALS — BP 114/74 | HR 90 | Temp 97.3°F | Ht 60.0 in | Wt 110.0 lb

## 2016-09-10 DIAGNOSIS — R5383 Other fatigue: Secondary | ICD-10-CM | POA: Diagnosis not present

## 2016-09-10 DIAGNOSIS — R238 Other skin changes: Secondary | ICD-10-CM

## 2016-09-10 LAB — CBC WITH DIFFERENTIAL/PLATELET
BASOS: 1 %
Basophils Absolute: 0 10*3/uL (ref 0.0–0.2)
EOS (ABSOLUTE): 0.1 10*3/uL (ref 0.0–0.4)
EOS: 2 %
HEMATOCRIT: 40.8 % (ref 34.0–46.6)
HEMOGLOBIN: 13.7 g/dL (ref 11.1–15.9)
IMMATURE GRANS (ABS): 0 10*3/uL (ref 0.0–0.1)
Immature Granulocytes: 0 %
LYMPHS ABS: 1.3 10*3/uL (ref 0.7–3.1)
LYMPHS: 30 %
MCH: 31.1 pg (ref 26.6–33.0)
MCHC: 33.6 g/dL (ref 31.5–35.7)
MCV: 93 fL (ref 79–97)
MONOCYTES: 8 %
Monocytes Absolute: 0.3 10*3/uL (ref 0.1–0.9)
NEUTROS ABS: 2.5 10*3/uL (ref 1.4–7.0)
Neutrophils: 59 %
Platelets: 215 10*3/uL (ref 150–379)
RBC: 4.41 x10E6/uL (ref 3.77–5.28)
RDW: 14.8 % (ref 12.3–15.4)
WBC: 4.2 10*3/uL (ref 3.4–10.8)

## 2016-09-10 MED ORDER — MUPIROCIN CALCIUM 2 % EX CREA: 1.0000 "application " | TOPICAL_CREAM | Freq: Two times a day (BID) | CUTANEOUS | 0 refills | Status: DC

## 2016-09-10 NOTE — Patient Instructions (Signed)
In a few days you may receive a survey in the mail or online from [employer] regarding your visit with us today. Please take a moment to fill this out. Your feedback is very important to our whole office. It can help us better understand your needs as well as improve your experience and satisfaction. Thank you for taking your time to complete it. We care about you.  Trellis Vanoverbeke, PA-C  

## 2016-09-10 NOTE — Telephone Encounter (Signed)
Called the patient to advise her I had faxed the script to [facility] online.  They have placed the order for the patient and need the patient to call them to verify this and her method of payment.  Called Ruth Terry and she will call them.

## 2016-09-13 NOTE — Progress Notes (Signed)
BP 114/74   Pulse 90   Temp (!) 97.3 F (36.3 C) (Oral)   Ht 5' (1.524 m)   Wt 110 lb (49.9 kg)   BMI 21.48 kg/m    Subjective:    Patient ID: Ruth Terry, female    DOB: [birth year], 61 y.o.   MRN: [id number]  HPI: Ruth Terry is a 61 y.o. female presenting on 09/10/2016 for pimple on right lower back Discussed some generalized fatigue and would like her CBC checked. There is a distant history of anemia.  Also has a pimple on her lower back. Has been there a couple of days.  Relevant past medical, surgical, family and social history reviewed and updated as indicated. Allergies and medications reviewed and updated.  Past Medical History:  Diagnosis Date  . Anal fissure   . Chronic constipation   . Diverticulosis 04-20-10   colonoscopy  . Gastroparesis   . GERD (gastroesophageal reflux disease)   . Hemorrhoids   . Hyperlipidemia   . IBS (irritable bowel syndrome)   . MVP (mitral valve prolapse)   . Polyp, stomach 04-20-10   egd  . Shingles   . UTI (lower urinary tract infection)     History reviewed. No pertinent surgical history.  Review of Systems  Constitutional: Positive for fatigue. Negative for fever.  HENT: Negative.   Eyes: Negative.   Respiratory: Negative.   Gastrointestinal: Negative.   Genitourinary: Negative.   Musculoskeletal: Negative for arthralgias and joint swelling.  Skin: Positive for wound.    Allergies as of 09/10/2016      Reactions   Chlordiazepoxide-clidinium Itching   Ciprofloxacin Itching   Irritates stomach   Flagyl [metronidazole]    Severe yeast infection   Sulfa Drugs Cross Reactors Nausea Only   Nitrofurantoin Monohyd Macro Rash      Medication List       Accurate as of 09/10/16 11:59 PM. Always use your most recent med list.          ALIGN 4 MG Caps Take 1 capsule by mouth daily.   AMBULATORY NON FORMULARY MEDICATION Medication Name: domperidome 10 mg q hs   AMBULATORY NON FORMULARY MEDICATION Medication  Name: Domperidone 10 mg Take 1 tablet at bedtime daily.   dexlansoprazole 60 MG capsule Commonly known as:  DEXILANT Take 1 capsule (60 mg total) by mouth daily.   linaclotide 72 MCG capsule Commonly known as:  LINZESS Take 1 tab daily.   mupirocin cream 2 % Commonly known as:  BACTROBAN Apply 1 application topically 2 (two) times daily.   polyethylene glycol powder powder Commonly known as:  GLYCOLAX/MIRALAX Take 17 g by mouth as needed.   psyllium 58.6 % packet Commonly known as:  METAMUCIL Take 1 packet by mouth daily.   rifaximin 200 MG tablet Commonly known as:  XIFAXAN Take 200 mg by mouth 3 (three) times daily.   Simethicone 250 MG Caps Commonly known as:  PHAZYME MAXIMUM STRENGTH Take 250 mg by mouth 3 (three) times daily.   valACYclovir 1000 MG tablet Commonly known as:  VALTREX Take 1,000 mg by mouth at bedtime.            Discharge Care Instructions        Start     Ordered   09/10/16 0000  CBC with Differential     09/10/16 1048   09/10/16 0000  mupirocin cream (BACTROBAN) 2 %  2 times daily    Question:  Supervising Provider  Answer:  Ermalinda Memos, SAMUEL L   09/10/16 1049         Objective:    BP 114/74   Pulse 90   Temp (!) 97.3 F (36.3 C) (Oral)   Ht 5' (1.524 m)   Wt 110 lb (49.9 kg)   BMI 21.48 kg/m   Allergies  Allergen Reactions  . Chlordiazepoxide-Clidinium Itching  . Ciprofloxacin Itching    Irritates stomach  . Flagyl [Metronidazole]     Severe yeast infection  . Sulfa Drugs Cross Reactors Nausea Only  . Nitrofurantoin Monohyd Macro Rash    Physical Exam  Constitutional: She is oriented to person, place, and time. She appears well-developed and well-nourished.  HENT:  Head: Normocephalic and atraumatic.  Eyes: Pupils are equal, round, and reactive to light. Conjunctivae and EOM are normal.  Cardiovascular: Normal rate, regular rhythm, normal heart sounds and intact distal pulses.   Pulmonary/Chest: Effort normal and  breath sounds normal.  Abdominal: Soft. Bowel sounds are normal.  Neurological: She is alert and oriented to person, place, and time. She has normal reflexes.  Skin: Skin is warm and dry. Rash noted. Rash is pustular. There is erythema.     Psychiatric: She has a normal mood and affect. Her behavior is normal. Judgment and thought content normal.    Results for orders placed or performed in visit on 09/10/16  CBC with Differential  Result Value Ref Range   WBC 4.2 3.4 - 10.8 x10E3/uL   RBC 4.41 3.77 - 5.28 x10E6/uL   Hemoglobin 13.7 11.1 - 15.9 g/dL   Hematocrit 78.2 95.6 - 46.6 %   MCV 93 79 - 97 fL   MCH 31.1 26.6 - 33.0 pg   MCHC 33.6 31.5 - 35.7 g/dL   RDW 21.3 08.6 - 57.8 %   Platelets 215 150 - 379 x10E3/uL   Neutrophils 59 Not Estab. %   Lymphs 30 Not Estab. %   Monocytes 8 Not Estab. %   Eos 2 Not Estab. %   Basos 1 Not Estab. %   Neutrophils Absolute 2.5 1.4 - 7.0 x10E3/uL   Lymphocytes Absolute 1.3 0.7 - 3.1 x10E3/uL   Monocytes Absolute 0.3 0.1 - 0.9 x10E3/uL   EOS (ABSOLUTE) 0.1 0.0 - 0.4 x10E3/uL   Basophils Absolute 0.0 0.0 - 0.2 x10E3/uL   Immature Granulocytes 0 Not Estab. %   Immature Grans (Abs) 0.0 0.0 - 0.1 x10E3/uL      Assessment & Plan:   1. Fatigue, unspecified type - CBC with Differential  2. Pimples - mupirocin cream (BACTROBAN) 2 %; Apply 1 application topically 2 (two) times daily.  Dispense: 30 g; Refill: 0    Current Outpatient Prescriptions:  .  AMBULATORY NON FORMULARY MEDICATION, Medication Name: domperidome 10 mg q hs, Disp: , Rfl:  .  AMBULATORY NON FORMULARY MEDICATION, Medication Name: Domperidone 10 mg Take 1 tablet at bedtime daily., Disp: 30 tablet, Rfl: 11 .  dexlansoprazole (DEXILANT) 60 MG capsule, Take 1 capsule (60 mg total) by mouth daily., Disp: 90 capsule, Rfl: 3 .  linaclotide (LINZESS) 72 MCG capsule, Take 1 tab daily., Disp: 30 capsule, Rfl: 8 .  polyethylene glycol powder (GLYCOLAX/MIRALAX) powder, Take 17 g by  mouth as needed. , Disp: , Rfl:  .  Probiotic Product (ALIGN) 4 MG CAPS, Take 1 capsule by mouth daily., Disp: , Rfl:  .  psyllium (METAMUCIL) 58.6 % packet, Take 1 packet by mouth daily., Disp: , Rfl:  .  rifaximin (XIFAXAN) 200 MG tablet, Take 200  mg by mouth 3 (three) times daily., Disp: , Rfl:  .  Simethicone (PHAZYME MAXIMUM STRENGTH) 250 MG CAPS, Take 250 mg by mouth 3 (three) times daily., Disp: 14 capsule, Rfl:  .  valACYclovir (VALTREX) 1000 MG tablet, Take 1,000 mg by mouth at bedtime. , Disp: , Rfl: 4 .  mupirocin cream (BACTROBAN) 2 %, Apply 1 application topically 2 (two) times daily., Disp: 30 g, Rfl: 0 Continue all other maintenance medications as listed above.  Follow up plan: Return if symptoms worsen or fail to improve.  Educational handout given for survey  Remus Loffler PA-C [facility] [address]  [place], [place] [address] [phone]   09/13/2016, 10:20 PM

## 2016-09-14 ENCOUNTER — Other Ambulatory Visit (INDEPENDENT_AMBULATORY_CARE_PROVIDER_SITE_OTHER): Payer: BLUE CROSS/BLUE SHIELD

## 2016-09-14 DIAGNOSIS — R140 Abdominal distension (gaseous): Secondary | ICD-10-CM | POA: Diagnosis not present

## 2016-09-14 DIAGNOSIS — R143 Flatulence: Secondary | ICD-10-CM

## 2016-09-14 DIAGNOSIS — K6389 Other specified diseases of intestine: Secondary | ICD-10-CM

## 2016-09-14 DIAGNOSIS — K581 Irritable bowel syndrome with constipation: Secondary | ICD-10-CM

## 2016-09-14 DIAGNOSIS — R195 Other fecal abnormalities: Secondary | ICD-10-CM | POA: Diagnosis not present

## 2016-09-14 LAB — HEMOCCULT SLIDES (X 3 CARDS)
Fecal Occult Blood: NEGATIVE
OCCULT 1: NEGATIVE
OCCULT 2: NEGATIVE
OCCULT 3: NEGATIVE
OCCULT 4: NEGATIVE
OCCULT 5: NEGATIVE

## 2016-09-21 ENCOUNTER — Telehealth: Payer: Self-pay | Admitting: Physician Assistant

## 2016-09-21 ENCOUNTER — Encounter: Payer: Self-pay | Admitting: Family

## 2016-09-21 ENCOUNTER — Ambulatory Visit (INDEPENDENT_AMBULATORY_CARE_PROVIDER_SITE_OTHER): Payer: BLUE CROSS/BLUE SHIELD | Admitting: Family

## 2016-09-21 VITALS — BP 124/74 | HR 89 | Temp 97.7°F | Wt 111.6 lb

## 2016-09-21 DIAGNOSIS — M791 Myalgia: Secondary | ICD-10-CM | POA: Diagnosis not present

## 2016-09-21 DIAGNOSIS — R238 Other skin changes: Secondary | ICD-10-CM

## 2016-09-21 DIAGNOSIS — M7918 Myalgia, other site: Secondary | ICD-10-CM

## 2016-09-21 MED ORDER — PREDNISONE 20 MG PO TABS: ORAL_TABLET | ORAL | 0 refills | Status: DC

## 2016-09-21 NOTE — Progress Notes (Addendum)
Subjective:    Patient ID: Ruth Terry, female    DOB: 1955-04-20, 61 y.o.   MRN: [id number]  HPI Pt presents to the office today with right lower buttocks pain that started last week. PT denies any dysuria, UTI symptoms, or lower back pain.  PT states the buttocks pain is intermittent aching pain of 6-7  Out 10 that is worse when walking on it.   Pt was seen on 09/10/16 with a small pimple on her right flank area. PT was worried this was causing her right buttocks pain. Pt was given Bactroban ointment. Pt states the area continues to be tender with a 5 out 10.    Review of Systems  Cardiovascular: Positive for leg swelling.  Musculoskeletal:       Right buttocks   All other systems reviewed and are negative.      Objective:   Physical Exam  Constitutional: She is oriented to person, place, and time. She appears well-developed and well-nourished. No distress.  HENT:  Head: Normocephalic.  Cardiovascular: Normal rate, regular rhythm, normal heart sounds and intact distal pulses.   No murmur heard. Pulmonary/Chest: Effort normal and breath sounds normal. No respiratory distress. She has no wheezes.  Abdominal: Soft. Bowel sounds are normal. She exhibits no distension. There is no tenderness.  Musculoskeletal: Normal range of motion. She exhibits no edema or tenderness.  Neurological: She is alert and oriented to person, place, and time.  Skin: Skin is warm and dry. Rash noted. Rash is pustular (small pustular on right flank 2X2cm).  Psychiatric: She has a normal mood and affect. Her behavior is normal. Judgment and thought content normal.  Vitals reviewed.     BP 124/74   Pulse 89   Temp 97.7 F (36.5 C)   Wt 111 lb 9.6 oz (50.6 kg)   BMI 21.80 kg/m      Assessment & Plan:  1. Acute buttock pain Rest Ice and heat ROM exercises  2. Pimples Warm compresses  Do not pick or squeeze  Continue bactroban ointment   Keep chronic follow up appts with PCP  PT  requesting low dose of prednisone for pain since pt is going on Vacation on Friday and wants to be able to walk on vacation   Jannifer Rodney, [place]

## 2016-09-21 NOTE — Patient Instructions (Signed)
Piriformis Syndrome Piriformis syndrome is a condition that can cause pain and numbness in your buttocks and down the back of your leg. Piriformis syndrome happens when the small muscle that connects the base of your spine to your hip (piriformis muscle) presses on the nerve that runs down the back of your leg (sciatic nerve). The piriformis muscle helps your hip rotate and helps to bring your leg back and out. It also helps shift your weight while you are walking to keep you stable. The sciatic nerve runs under or through the piriformis. Damage to the piriformis muscle can cause spasms that put pressure on the nerve below. This causes pain and discomfort while sitting and moving. The pain may feel as if it begins in the buttock and spreads (radiates) down your hip and thigh. What are the causes? This condition is caused by pressure on the sciatic nerve from the piriformis muscle. The piriformis muscle can get irritated with overuse, especially if other hip muscles are weak and the piriformis has to do extra work. Piriformis syndrome can also occur after an injury, like a fall onto your buttocks. What increases the risk? This condition is more likely to develop in:  Women.  People who sit for long periods of time.  Cyclists.  People who have weak buttocks muscles (gluteal muscles).  What are the signs or symptoms? Pain, tingling, or numbness that starts in the buttock and runs down the back of your leg (sciatica) is the most common symptom of this condition. Your symptoms may:  Get worse the longer you sit.  Get worse when you walk, run, or go up on stairs.  How is this diagnosed? This condition is diagnosed based on your symptoms, medical history, and physical exam. During this exam, your health care provider may move your leg into different positions to check for pain. He or she will also press on the muscles of your hip and buttock to see if that increases your symptoms. You may also have  an X-ray or MRI. How is this treated? Treatment for this condition may include:  Stopping all activities that cause pain or make your condition worse.  Using heat or ice to relieve pain as told by your health care provider.  Taking medicines to reduce pain and swelling.  Taking a muscle relaxer to release the piriformis muscle.  Doing range-of-motion and strengthening exercises (physical therapy) as told by your health care provider.  Massaging the affected area.  Getting an injection of an anti-inflammatory medicine or muscle relaxer to reduce inflammation and muscle tension.  In rare cases, you may need surgery to cut the muscle and release pressure on the nerve if other treatments do not work. Follow these instructions at home:  Take over-the-counter and prescription medicines only as told by your health care provider.  Do not sit for long periods. Get up and walk around every 20 minutes or as often as told by your health care provider.  If directed, apply heat to the affected area as often as told by your health care provider. Use the heat source that your health care provider recommends, such as a moist heat pack or a heating pad. ? Place a towel between your skin and the heat source. ? Leave the heat on for 20-30 minutes. ? Remove the heat if your skin turns bright red. This is especially important if you are unable to feel pain, heat, or cold. You may have a greater risk of getting burned.  If   directed, apply ice to the injured area. ? Put ice in a plastic bag. ? Place a towel between your skin and the bag. ? Leave the ice on for 20 minutes, 2-3 times a day.  Do exercises as told by your health care provider.  Return to your normal activities as told by your health care provider. Ask your health care provider what activities are safe for you.  Keep all follow-up visits as told by your health care provider. This is important. How is this prevented?  Do not sit for  longer than 20 minutes at a time. When you sit, choose padded surfaces.  Warm up and stretch before being active.  Cool down and stretch after being active.  Give your body time to rest between periods of activity.  Make sure to use equipment that fits you.  Maintain physical fitness, including: ? Strength. ? Flexibility. Contact a health care provider if:  Your pain and stiffness continue or get worse.  Your leg or hip becomes weak.  You have changes in your bowel function or bladder function. This information is not intended to replace advice given to you by your health care provider. Make sure you discuss any questions you have with your health care provider. Document Released: 12/21/2004 Document Revised: 08/26/2015 Document Reviewed: 12/03/2014 Elsevier Interactive Patient Education  2018 Elsevier Inc.  

## 2016-09-21 NOTE — Telephone Encounter (Signed)
Patient called stating that she has had muscle pain around bump on back.  Appt made to be seen

## 2016-09-21 NOTE — Addendum Note (Signed)
Addended by: Evelina Dun A on: 09/21/2016 04:40 PM   Modules accepted: Orders

## 2016-09-29 ENCOUNTER — Telehealth: Payer: Self-pay | Admitting: Physician Assistant

## 2016-09-29 NOTE — Telephone Encounter (Signed)
Patient states she did well on Xifaxan and was symptom free. Now she is experiencing constipation, gas and bloating. She went back to Linzess 145 mcg as of today. She asks if she needs to repeat the Xifaxan.

## 2016-09-30 NOTE — Telephone Encounter (Signed)
Patient calling back to check status of this and requesting a call to update. Notified pt that APP [name] was at [place].

## 2016-10-01 ENCOUNTER — Other Ambulatory Visit: Payer: Self-pay

## 2016-10-01 MED ORDER — RIFAXIMIN 550 MG PO TABS: 550.0000 mg | ORAL_TABLET | Freq: Three times a day (TID) | ORAL | 0 refills | Status: DC

## 2016-10-01 MED ORDER — LINACLOTIDE 145 MCG PO CAPS: 145.0000 ug | ORAL_CAPSULE | Freq: Every day | ORAL | 3 refills | Status: DC

## 2016-10-01 NOTE — Telephone Encounter (Signed)
We can repeat a course of Xifaxan 550 mg po TID x 14 days - see if can get covered for her , I do not have an samples to offer. She can stay on Linzess, and low gas diet

## 2016-10-01 NOTE — Telephone Encounter (Signed)
Spoke with the patient and advised. She says she is on a low gas diet and "itdon'tdo no good!"  [facility]. I completed the PA for Xifaxan through "CoverMyMeds" web site. Notified the patient of this.

## 2016-10-01 NOTE — Telephone Encounter (Signed)
Pt is calling back and would like a call back today

## 2016-10-04 ENCOUNTER — Encounter: Payer: Self-pay | Admitting: Family Medicine

## 2016-10-04 ENCOUNTER — Ambulatory Visit (INDEPENDENT_AMBULATORY_CARE_PROVIDER_SITE_OTHER): Payer: BLUE CROSS/BLUE SHIELD | Admitting: Family Medicine

## 2016-10-04 VITALS — BP 120/72 | HR 92 | Temp 97.7°F | Ht 61.0 in | Wt 110.4 lb

## 2016-10-04 DIAGNOSIS — R1011 Right upper quadrant pain: Secondary | ICD-10-CM

## 2016-10-04 DIAGNOSIS — R350 Frequency of micturition: Secondary | ICD-10-CM | POA: Diagnosis not present

## 2016-10-04 LAB — CMP14+EGFR
ALK PHOS: 76 IU/L (ref 39–117)
ALT: 12 IU/L (ref 0–32)
AST: 14 IU/L (ref 0–40)
Albumin/Globulin Ratio: 2.4 — ABNORMAL HIGH (ref 1.2–2.2)
Albumin: 4.3 g/dL (ref 3.5–5.5)
BUN / CREAT RATIO: 10 (ref 9–23)
BUN: 7 mg/dL (ref 6–24)
CHLORIDE: 105 mmol/L (ref 96–106)
CO2: 25 mmol/L (ref 20–29)
Calcium: 9.3 mg/dL (ref 8.7–10.2)
Creatinine, Ser: 0.71 mg/dL (ref 0.57–1.00)
GFR calc Af Amer: 108 mL/min/{1.73_m2} (ref >59)
GFR calc non Af Amer: 94 mL/min/{1.73_m2} (ref >59)
GLUCOSE: 85 mg/dL (ref 65–99)
Globulin, Total: 1.8 g/dL (ref 1.5–4.5)
Potassium: 4.1 mmol/L (ref 3.5–5.2)
Sodium: 143 mmol/L (ref 134–144)
Total Protein: 6.1 g/dL (ref 6.0–8.5)

## 2016-10-04 LAB — CBC WITH DIFFERENTIAL/PLATELET
BASOS ABS: 0 10*3/uL (ref 0.0–0.2)
Basos: 1 %
EOS (ABSOLUTE): 0.1 10*3/uL (ref 0.0–0.4)
Eos: 2 %
Hematocrit: 41 % (ref 34.0–46.6)
Hemoglobin: 13.7 g/dL (ref 11.1–15.9)
Immature Grans (Abs): 0 10*3/uL (ref 0.0–0.1)
Immature Granulocytes: 0 %
LYMPHS ABS: 1.4 10*3/uL (ref 0.7–3.1)
LYMPHS: 32 %
MCH: 30.4 pg (ref 26.6–33.0)
MCHC: 33.4 g/dL (ref 31.5–35.7)
MCV: 91 fL (ref 79–97)
Monocytes Absolute: 0.3 10*3/uL (ref 0.1–0.9)
Monocytes: 7 %
NEUTROS ABS: 2.6 10*3/uL (ref 1.4–7.0)
Neutrophils: 58 %
PLATELETS: 231 10*3/uL (ref 150–379)
RBC: 4.5 x10E6/uL (ref 3.77–5.28)
RDW: 14.5 % (ref 12.3–15.4)
WBC: 4.4 10*3/uL (ref 3.4–10.8)

## 2016-10-04 LAB — URINALYSIS, COMPLETE
Bilirubin, UA: NEGATIVE
Glucose, UA: NEGATIVE
KETONES UA: NEGATIVE
LEUKOCYTES UA: NEGATIVE
NITRITE UA: NEGATIVE
PROTEIN UA: NEGATIVE
RBC UA: NEGATIVE
Specific Gravity, UA: 1.02 (ref 1.005–1.030)
Urobilinogen, Ur: 0.2 mg/dL (ref 0.2–1.0)
pH, UA: 5.5 (ref 5.0–7.5)

## 2016-10-04 LAB — MICROSCOPIC EXAMINATION
Bacteria, UA: NONE SEEN
RBC, UA: NONE SEEN /hpf (ref 0–?)
RENAL EPITHEL UA: NONE SEEN /HPF
WBC, UA: NONE SEEN /hpf (ref 0–?)

## 2016-10-04 NOTE — Patient Instructions (Signed)
Great to see you!  If your symptoms do not improve in a few days or if we find concerns in the labs we will pursue an [place] of your abdomen to check your liver and gall bladder.

## 2016-10-04 NOTE — Progress Notes (Signed)
   HPI  Patient presents today here for right upper quadrant pain and urinary frequency.  Patient has had symptoms for about 5 days. She states that she takes domperidone from [place] and is concerned about her LFTs. She describes bilateral upper quadrant abdominal pain that radiates to her bilateral flanks.  Patient is tolerating food and fluids like usual. She denies any aggravating or alleviating factors except for slight improvement with a heating pad early in the course of the right upper quadrant pain.  She denies any injury.   PMH: Smoking status noted ROS: Per HPI  Objective: BP 120/72   Pulse 92   Temp 97.7 F (36.5 C) (Oral)   Ht '5\' 1"'$  (1.549 m)   Wt 110 lb 6.4 oz (50.1 kg)   BMI 20.86 kg/m  Gen: NAD, alert, cooperative with exam HEENT: NCAT, EOMI, PERRL CV: RRR, good S1/S2, no murmur Resp: CTABL, no wheezes, non-labored Abd: No CVA tenderness, mild tenderness to palpation of right upper quadrant and left upper quadrant, however right slightly worse than the left, no suprapubic tenderness, no guarding, positive bowel sounds Ext: No edema, warm Neuro: Alert and oriented, No gross deficits  Assessment and plan:  # Frequent urination, right upper quadrant pain Reassurance provided Mild symptoms Checking CMP and CBC today, urine culture pending. If symptoms not improving in 3-5 days or if labs are abnormal would recommend proceeding with ultrasound of the abdomen.  RTC with any concerns    Orders Placed This Encounter  Procedures  . Urine Culture  . Urinalysis, Complete  . CBC with Differential/Platelet  . [place], MD [place] 10/04/2016, 12:30 PM

## 2016-10-06 ENCOUNTER — Telehealth: Payer: Self-pay | Admitting: *Deleted

## 2016-10-06 ENCOUNTER — Telehealth: Payer: Self-pay | Admitting: Physician Assistant

## 2016-10-06 DIAGNOSIS — R1011 Right upper quadrant pain: Secondary | ICD-10-CM

## 2016-10-06 LAB — URINE CULTURE

## 2016-10-06 NOTE — Telephone Encounter (Signed)
As discussed at visit- Abd [place] ordered.   Laroy Apple, MD [facility] 10/06/2016, 11:54 AM

## 2016-10-06 NOTE — Telephone Encounter (Signed)
Patient aware.

## 2016-10-06 NOTE — Telephone Encounter (Signed)
Please review and advise.

## 2016-10-06 NOTE — Telephone Encounter (Signed)
Patient wants to go to [facility] for [place] or at least stay in [facility].

## 2016-10-12 ENCOUNTER — Ambulatory Visit (HOSPITAL_COMMUNITY)
Admission: RE | Admit: 2016-10-12 | Discharge: 2016-10-12 | Disposition: A | Discharge status: Home / Self Care | Payer: BLUE CROSS/BLUE SHIELD | Source: Ambulatory Visit | Attending: Family Medicine | Admitting: Family Medicine

## 2016-10-12 DIAGNOSIS — K760 Fatty (change of) liver, not elsewhere classified: Secondary | ICD-10-CM | POA: Diagnosis not present

## 2016-10-12 DIAGNOSIS — I700 Atherosclerosis of aorta: Secondary | ICD-10-CM | POA: Diagnosis not present

## 2016-10-12 DIAGNOSIS — R1011 Right upper quadrant pain: Secondary | ICD-10-CM | POA: Insufficient documentation

## 2016-10-19 ENCOUNTER — Encounter: Payer: Self-pay | Admitting: Family Medicine

## 2016-10-19 ENCOUNTER — Telehealth: Payer: Self-pay | Admitting: Physician Assistant

## 2016-10-19 ENCOUNTER — Ambulatory Visit (INDEPENDENT_AMBULATORY_CARE_PROVIDER_SITE_OTHER): Payer: BLUE CROSS/BLUE SHIELD | Admitting: Family Medicine

## 2016-10-19 ENCOUNTER — Telehealth: Payer: Self-pay | Admitting: Internal Medicine

## 2016-10-19 VITALS — BP 125/76 | HR 94 | Temp 97.1°F | Ht 61.0 in | Wt 112.2 lb

## 2016-10-19 DIAGNOSIS — R3915 Urgency of urination: Secondary | ICD-10-CM

## 2016-10-19 DIAGNOSIS — K760 Fatty (change of) liver, not elsewhere classified: Secondary | ICD-10-CM

## 2016-10-19 DIAGNOSIS — E781 Pure hyperglyceridemia: Secondary | ICD-10-CM

## 2016-10-19 LAB — MICROSCOPIC EXAMINATION
Bacteria, UA: NONE SEEN
Epithelial Cells (non renal): NONE SEEN /hpf (ref 0–10)
RBC MICROSCOPIC, UA: NONE SEEN /HPF (ref 0–?)
Renal Epithel, UA: NONE SEEN /hpf
WBC UA: NONE SEEN /HPF (ref 0–?)

## 2016-10-19 LAB — URINALYSIS, COMPLETE
Bilirubin, UA: NEGATIVE
GLUCOSE, UA: NEGATIVE
KETONES UA: NEGATIVE
Leukocytes, UA: NEGATIVE
NITRITE UA: NEGATIVE
PROTEIN UA: NEGATIVE
RBC, UA: NEGATIVE
SPEC GRAV UA: 1.01 (ref 1.005–1.030)
UUROB: 0.2 mg/dL (ref 0.2–1.0)
pH, UA: 6 (ref 5.0–7.5)

## 2016-10-19 MED ORDER — URIBEL 118 MG PO CAPS: 1.0000 | ORAL_CAPSULE | Freq: Four times a day (QID) | ORAL | 1 refills | Status: DC

## 2016-10-19 MED ORDER — ICOSAPENT ETHYL 1 G PO CAPS: 2.0000 g | ORAL_CAPSULE | Freq: Two times a day (BID) | ORAL | 3 refills | Status: DC

## 2016-10-19 NOTE — Telephone Encounter (Signed)
Pt states Uribel and something for [facility] triglycerides [facility] but their computers were down patient wants it to be resent back in

## 2016-10-19 NOTE — Patient Instructions (Signed)
Great to see you!  Try vascepa 2 capsules twice daily ( this is a highly purified fish oil product for hypertriglyceridemia).   We will call for a urology appointment

## 2016-10-19 NOTE — Telephone Encounter (Signed)
Pt states the meds that where just sent in for triglycerides cost to much $700 and she has question about all the med's that were perscribed for her today

## 2016-10-19 NOTE — Telephone Encounter (Signed)
Pt has US done and it showed the beginning stages of fatty liver.  Pt was asking if [place] had a liver specialist. Let pt know we refer to [place] or [place]. Pt states she is going to see her PCP today and she might be calling back to schedule and appointment with Dr. Henrene Pastor.

## 2016-10-19 NOTE — Progress Notes (Signed)
   HPI  Patient presents today Here to follow up for her ultrasound as well as urinary urgency.  Patient states that her previous urologist has shut down due to hospital under skin changes. She requests referral to another one. She states that she's done very well with uribel, would like referral for urinary urgency  Patient is very concerned about increased echogenicity noted on her ultrasound. We had an extensive discussion about hypertriglyceridemia and fatty liver.  PMH: Smoking status noted ROS: Per HPI  Objective: BP 125/76   Pulse 94   Temp (!) 97.1 F (36.2 C) (Oral)   Ht 5\' 1"  (1.549 m)   Wt 112 lb 3.2 oz (50.9 kg)   BMI 21.20 kg/m  Gen: NAD, alert, cooperative with exam HEENT: NCAT CV: RRR, good S1/S2, no murmur Resp: CTABL, no wheezes, non-labored Ext: No edema, warm Neuro: Alert and oriented, No gross deficits  Assessment and plan:  # Urinary urgency Stable Refill uribel Refer to Urology- Dr.Ottelien   # Fatty liver Discussed hypertriglyceridemia is likely the most likely etiology. Reviewed ultrasound in detail  # Hypertriglyceridemia Mild-to-moderate Starting vascepa   Orders Placed This Encounter  Procedures  . Urinalysis, Complete  . Ambulatory referral to Urology    Referral Priority:   Routine    Referral Type:   Consultation    Referral Reason:   Specialty Services Required    Requested Specialty:   Urology    Number of Visits Requested:   1    Meds ordered this encounter  Medications  . Icosapent Ethyl (VASCEPA) 1 g CAPS    Sig: Take 2 g by mouth 2 (two) times daily. With meals    Dispense:  360 capsule    Refill:  3  . Meth-Hyo-M Bl-Na Phos-Ph Sal (URIBEL) 118 MG CAPS    Sig: Take 1 capsule (118 mg total) by mouth 4 (four) times daily.    Dispense:  120 capsule    Refill:  [place]ack[place], MD [place] 10/19/2016, 2:20 PM

## 2016-10-20 NOTE — Telephone Encounter (Signed)
lmtcb

## 2016-10-20 NOTE — Telephone Encounter (Signed)
Will talk with Dr. Wendi Snipes about the meds.  Vascepa is a brand name, did she get a copay card/coupon?  May have to change altogether.  What other questions are there?

## 2016-10-22 ENCOUNTER — Telehealth: Payer: Self-pay | Admitting: Internal Medicine

## 2016-10-25 NOTE — Telephone Encounter (Signed)
Patient called back she has 3 concerns: 1. Still having reflux pain, feels this in her back between shoulder blades. Takes Dexilant 60 mg daily.  2. Still constipated, is on Linzess 145 mcg daily (72 mcg didn't work for her) takes Metamucil daily. Patient states that Miralax gives her diarrhea.   3. Patient was concerned about [place] results and wants to know what to do for fatty liver. Instructed patient to watch diet, stop or decrease alcohol and exercise. She wants to know if there is a medication that would help.

## 2016-10-25 NOTE — Telephone Encounter (Signed)
1. Not sure that the pain between her shoulder blades his reflux 2. Try linzess 290 g daily. If this does not work try a lower dose MiraLAX or MiraLAX every other day along with fiber 3. No medications for fatty liver. Exercise and low-fat diet

## 2016-10-25 NOTE — Telephone Encounter (Signed)
Spoke to patient, offered her an appointment re: reflux, she declined at this point. Also suggested she talk to her PCP about this pain. She is going to stick to Linzess 145 mcg daily and try the Miralax every other day. Advised again on help related to fatty liver. Patient will try these ideas and if not better will call back.

## 2016-11-02 NOTE — Telephone Encounter (Signed)
Several attempts have been made to contact patient. Encounter will be closed.

## 2016-11-03 ENCOUNTER — Telehealth: Payer: Self-pay | Admitting: Physician Assistant

## 2016-11-03 MED ORDER — VALACYCLOVIR HCL 1 G PO TABS: 1000.0000 mg | ORAL_TABLET | Freq: Every day | ORAL | 4 refills | Status: DC

## 2016-11-03 NOTE — Telephone Encounter (Signed)
Rx sent to pharmacy and patient aware.  

## 2016-11-03 NOTE — Telephone Encounter (Signed)
What is the name of the medication? valACYclovir (VALTREX) 1000 MG tablet  Have you contacted your pharmacy to request a refill? no  Which [facility] [place] Pt asked to talk to [facility]   Patient notified that their request is being sent to the clinical staff for review and that they should receive a call once it is complete. If they do not receive a call within 24 hours they can check with their pharmacy or our office.

## 2016-11-27 DIAGNOSIS — Z79899 Other long term (current) drug therapy: Secondary | ICD-10-CM | POA: Diagnosis not present

## 2016-11-27 DIAGNOSIS — R0789 Other chest pain: Secondary | ICD-10-CM | POA: Diagnosis not present

## 2016-11-27 DIAGNOSIS — T1490XA Injury, unspecified, initial encounter: Secondary | ICD-10-CM | POA: Diagnosis not present

## 2016-11-27 DIAGNOSIS — K219 Gastro-esophageal reflux disease without esophagitis: Secondary | ICD-10-CM | POA: Diagnosis not present

## 2016-11-27 DIAGNOSIS — R1010 Upper abdominal pain, unspecified: Secondary | ICD-10-CM | POA: Diagnosis not present

## 2016-11-27 DIAGNOSIS — R109 Unspecified abdominal pain: Secondary | ICD-10-CM | POA: Diagnosis not present

## 2016-11-27 DIAGNOSIS — Y92007 Garden or yard of unspecified non-institutional (private) residence as the place of occurrence of the external cause: Secondary | ICD-10-CM | POA: Diagnosis not present

## 2016-11-27 DIAGNOSIS — S301XXA Contusion of abdominal wall, initial encounter: Secondary | ICD-10-CM | POA: Diagnosis not present

## 2016-12-03 ENCOUNTER — Encounter: Payer: Self-pay | Admitting: Physician Assistant

## 2016-12-03 ENCOUNTER — Ambulatory Visit: Payer: BLUE CROSS/BLUE SHIELD | Admitting: Physician Assistant

## 2016-12-03 VITALS — BP 121/73 | HR 99 | Temp 97.0°F | Ht 61.0 in | Wt 112.0 lb

## 2016-12-03 DIAGNOSIS — S20211D Contusion of right front wall of thorax, subsequent encounter: Secondary | ICD-10-CM

## 2016-12-03 DIAGNOSIS — S20211A Contusion of right front wall of thorax, initial encounter: Secondary | ICD-10-CM | POA: Insufficient documentation

## 2016-12-03 MED ORDER — MELOXICAM 7.5 MG PO TABS: 7.5000 mg | ORAL_TABLET | Freq: Every day | ORAL | 0 refills | Status: DC

## 2016-12-03 NOTE — Patient Instructions (Addendum)
In a few days you may receive a survey in the mail or online from [employer] regarding your visit with [place] today. Please take a moment to fill this out. Your feedback is very important to our whole office. It can help [place] better understand your needs as well as improve your experience and satisfaction. Thank you for taking your time to complete it. We care about you.  Particia Nearing, PA-C Here are some potassium rich foods you can try.   Squash Sweet potato Potato, medium White beans Yogurt, fat-free Halibut, salmon 100% orange juice Broccoli Cantaloupe Banana Pork tenderloin Lentils Milk Pistachios, and other dried nuts Raisins Chicken breast Tuna, light

## 2016-12-04 ENCOUNTER — Telehealth: Payer: Self-pay | Admitting: Physician Assistant

## 2016-12-06 ENCOUNTER — Telehealth: Payer: Self-pay | Admitting: Physician Assistant

## 2016-12-06 NOTE — Telephone Encounter (Signed)
Appt given for the AM

## 2016-12-06 NOTE — Telephone Encounter (Signed)
Please advise 

## 2016-12-06 NOTE — Telephone Encounter (Signed)
She can be seen again, unless she feels it is an emergency and needs to go to ED.  Okay to schedule with urgent person here if I do not have an opening.

## 2016-12-06 NOTE — Telephone Encounter (Signed)
Notified patient to alternate IBU and tylenol. Patient states that she is having extreme pain in her right breast and right side area. She said its not a sore feeling. She is worried that something else is going on and wants to know if you think she ntbs again. Please advise

## 2016-12-06 NOTE — Telephone Encounter (Signed)
See previous note

## 2016-12-06 NOTE — Telephone Encounter (Signed)
Alternate tylenol with ibuprofen or aleve.
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
## 2016-12-07 ENCOUNTER — Ambulatory Visit (INDEPENDENT_AMBULATORY_CARE_PROVIDER_SITE_OTHER): Payer: BLUE CROSS/BLUE SHIELD

## 2016-12-07 ENCOUNTER — Other Ambulatory Visit: Payer: Self-pay | Admitting: Physician Assistant

## 2016-12-07 ENCOUNTER — Ambulatory Visit: Payer: BLUE CROSS/BLUE SHIELD | Admitting: Physician Assistant

## 2016-12-07 ENCOUNTER — Encounter: Payer: Self-pay | Admitting: Physician Assistant

## 2016-12-07 VITALS — BP 114/79 | HR 87 | Temp 97.2°F | Ht 61.0 in | Wt 110.0 lb

## 2016-12-07 DIAGNOSIS — S299XXD Unspecified injury of thorax, subsequent encounter: Secondary | ICD-10-CM | POA: Diagnosis not present

## 2016-12-07 DIAGNOSIS — S2231XA Fracture of one rib, right side, initial encounter for closed fracture: Secondary | ICD-10-CM

## 2016-12-07 DIAGNOSIS — S2239XA Fracture of one rib, unspecified side, initial encounter for closed fracture: Secondary | ICD-10-CM | POA: Insufficient documentation

## 2016-12-07 DIAGNOSIS — S299XXA Unspecified injury of thorax, initial encounter: Secondary | ICD-10-CM
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
## 2016-12-07 MED ORDER — METHOCARBAMOL 500 MG PO TABS: 500.0000 mg | ORAL_TABLET | Freq: Four times a day (QID) | ORAL | 0 refills | Status: DC
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
## 2016-12-07 NOTE — Patient Instructions (Signed)
In a few days you may receive a survey in the mail or online from [employer] regarding your visit with us today. Please take a moment to fill this out. Your feedback is very important to our whole office. It can help us better understand your needs as well as improve your experience and satisfaction. Thank you for taking your time to complete it. We care about you.  Kailer Heindel, PA-C  

## 2016-12-08 ENCOUNTER — Telehealth: Payer: Self-pay | Admitting: Physician Assistant

## 2016-12-08 NOTE — Telephone Encounter (Signed)
Pt is rtn wendys call

## 2016-12-08 NOTE — Telephone Encounter (Signed)
Spoke with patient about medication  

## 2016-12-22 ENCOUNTER — Ambulatory Visit: Payer: BLUE CROSS/BLUE SHIELD | Admitting: Physician Assistant

## 2016-12-22 ENCOUNTER — Encounter: Payer: Self-pay | Admitting: Physician Assistant

## 2016-12-22 VITALS — BP 116/66 | HR 88 | Temp 96.9°F | Ht 61.0 in | Wt 112.4 lb

## 2016-12-22 DIAGNOSIS — S2231XD Fracture of one rib, right side, subsequent encounter for fracture with routine healing: Secondary | ICD-10-CM

## 2016-12-22 DIAGNOSIS — J40 Bronchitis, not specified as acute or chronic: Secondary | ICD-10-CM | POA: Diagnosis not present

## 2016-12-22 MED ORDER — LEVOFLOXACIN 500 MG PO TABS: 500.0000 mg | ORAL_TABLET | Freq: Every day | ORAL | 0 refills | Status: DC
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
## 2016-12-22 NOTE — Patient Instructions (Signed)
Come for xray in 3 weeks, order will be placed now, you can just be on the nurse schedule. Call what day you can come and they will out you on.  And then we can let you know the final.    In a few days you may receive a survey in the mail or online from [employer] regarding your visit with [place] today. Please take a moment to fill this out. Your feedback is very important to our whole office. It can help [place] better understand your needs as well as improve your experience and satisfaction. Thank you for taking your time to complete it. We care about you.  Particia Nearing, PA-C

## 2016-12-23 IMAGING — US US ABDOMEN COMPLETE
1 series · 13 of 25 positions shown · non-contrast
Comparison: [DATE] CT.  [DATE] ultrasound.

CLINICAL DATA: 61-year-old female with right upper quadrant pain
for the past 2 and half weeks. Initial encounter.

EXAM:
ABDOMEN ULTRASOUND COMPLETE

[Series 1: us abdomen complete · 0.14mm/px · 13 of 108 slices shown]
[im 1/108]
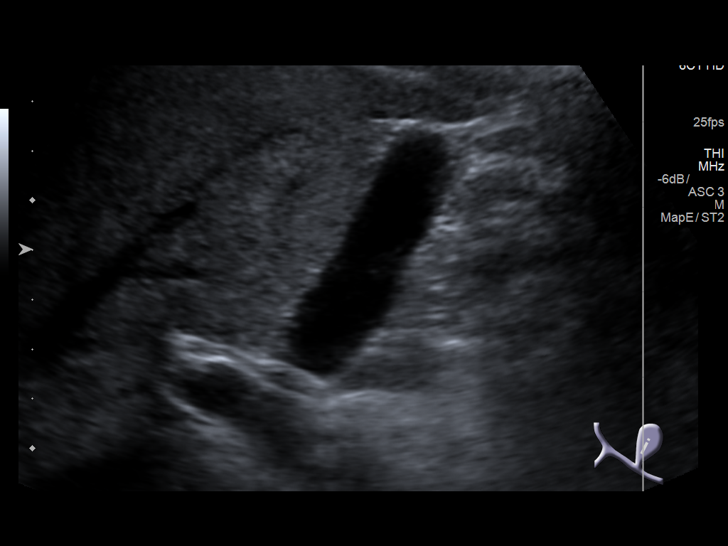
[im 9/108]
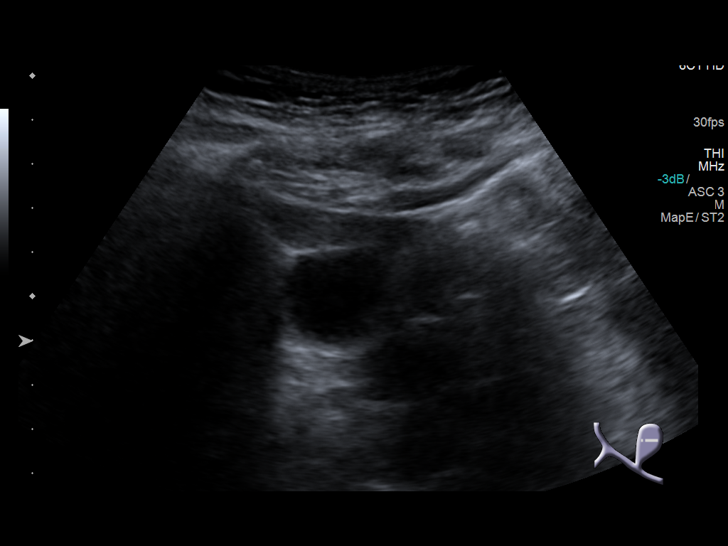
[im 18/108]
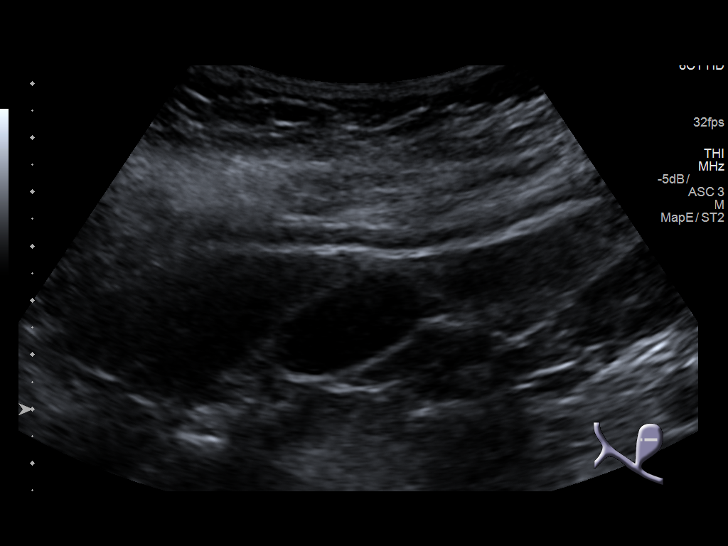
[im 27/108]
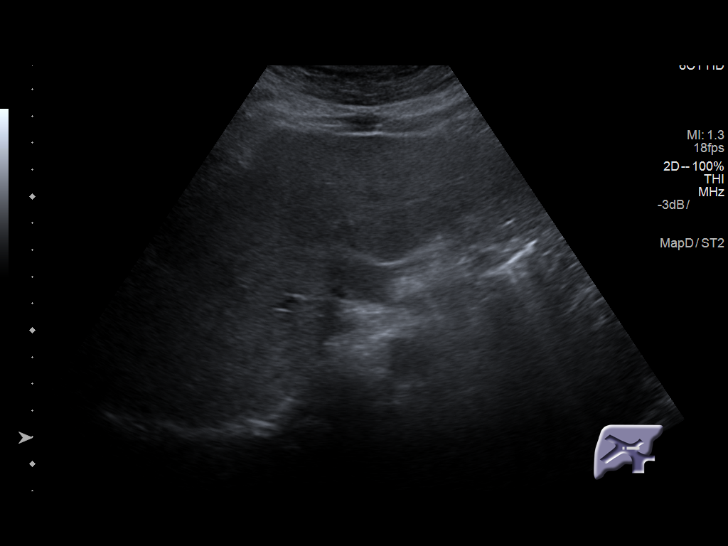
[im 36/108]
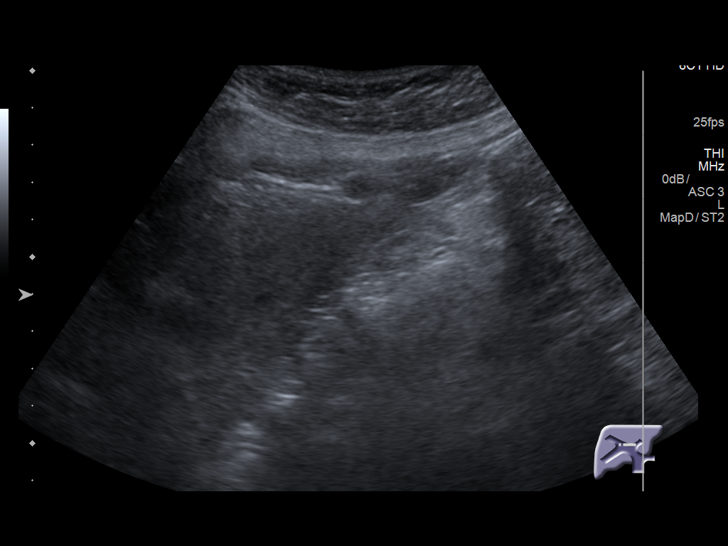
[im 45/108]
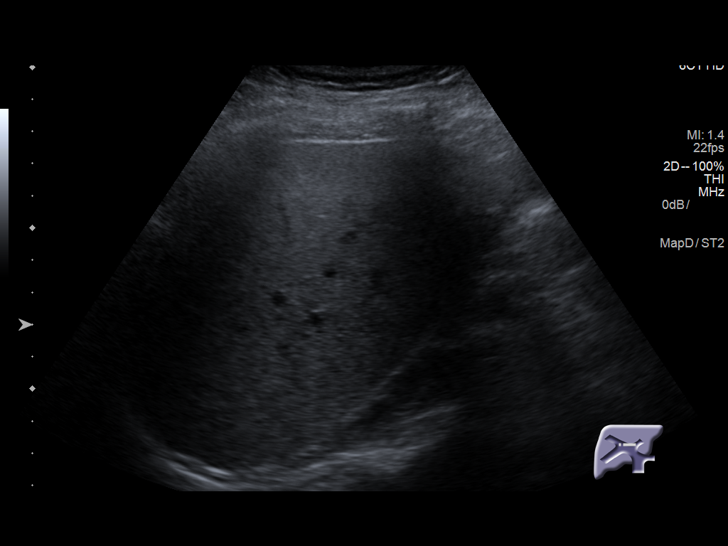
[im 54/108]
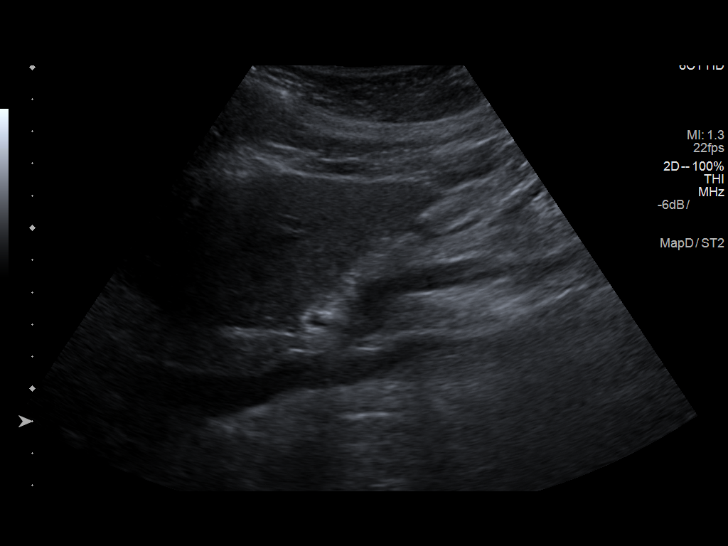
[im 63/108]
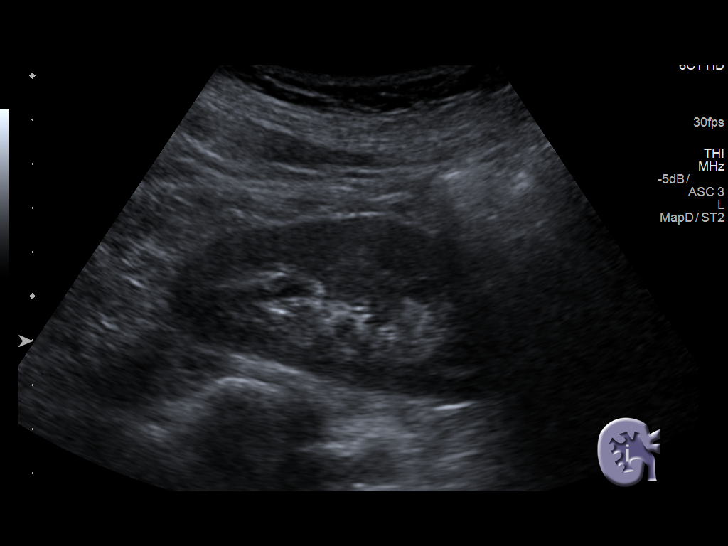
[im 72/108]
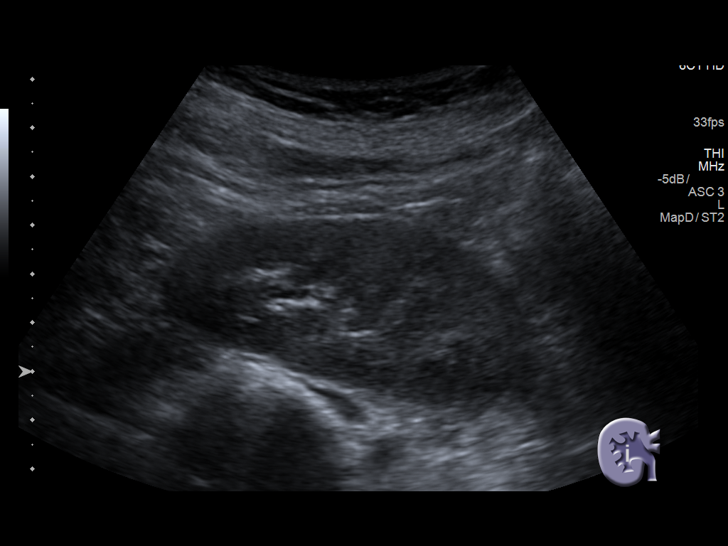
[im 81/108]
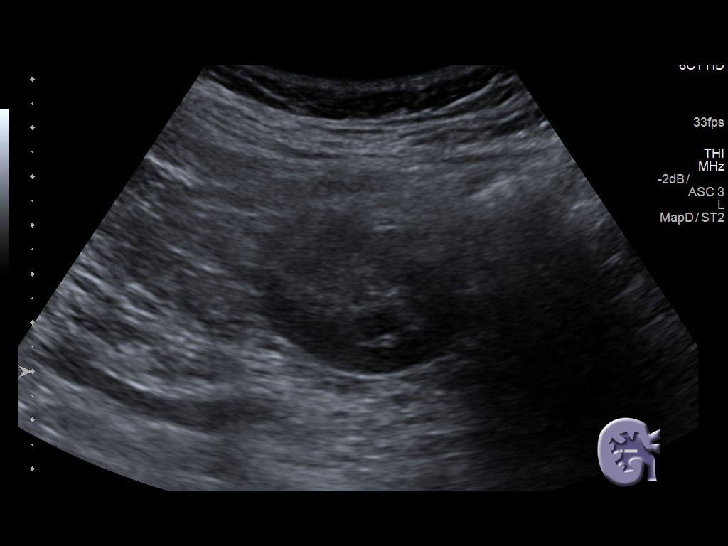
[im 90/108]
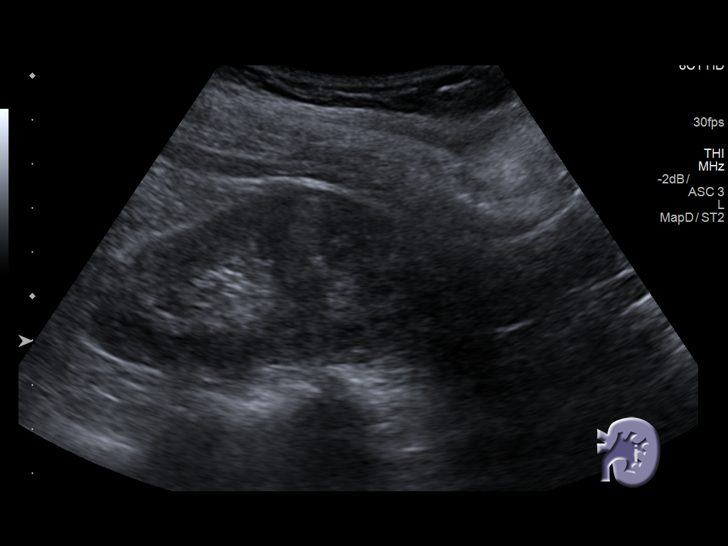
[im 99/108]
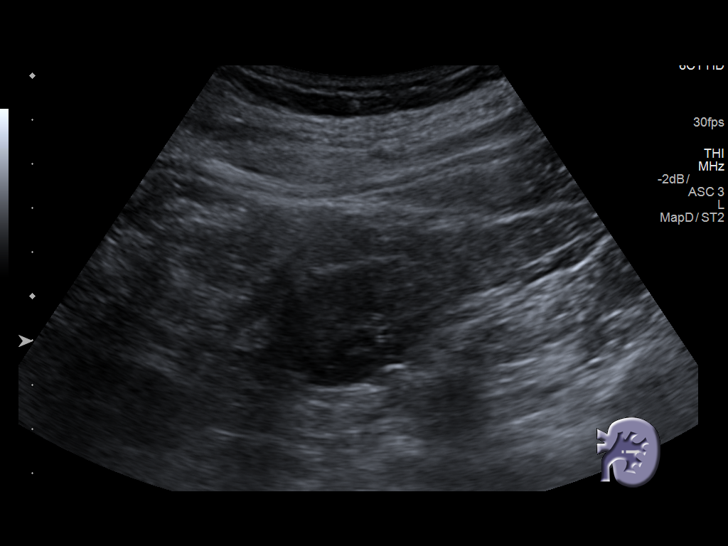
[im 108/108]
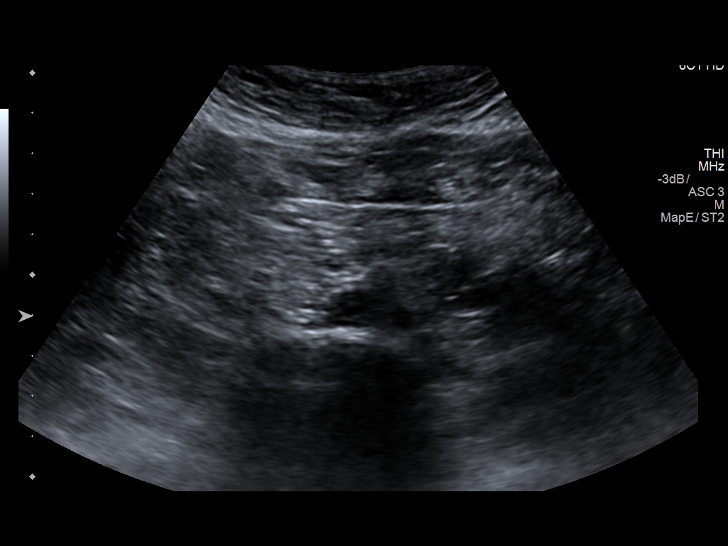

[13 of 25 positions shown; findings below may reference images not displayed]

FINDINGS: Gallbladder: No gallstones or wall thickening visualized. No
sonographic Murphy sign noted by sonographer.

Common bile duct: Diameter: 2.2 mm. Distal aspect not visualized
secondary to bowel gas.

Liver: Increase echogenicity of the liver most commonly secondary to
fatty infiltration and less likely hepatocellular disease. No mass
or intrahepatic biliary duct dilation Portal vein is patent on color
Doppler imaging with normal direction of blood flow towards the
liver.

IVC: No abnormality visualized.

Pancreas: Suboptimally evaluated secondary to habitus and bowel gas.
Portions visualized unremarkable.

Spleen: Size and appearance within normal limits.

Right Kidney: Length: 8 cm. Echogenicity within normal limits. No
mass or hydronephrosis visualized.

Left Kidney: Length: 10 cm. Echogenicity within normal limits. No
mass or hydronephrosis visualized.

Abdominal aorta: No aneurysm visualized.  Atherosclerotic changes.

Other findings: None.
IMPRESSION: No gallstones or evidence of gallbladder inflammation.

Fatty liver.

Pancreas suboptimally evaluated secondary to habitus and bowel gas.
Portions visualized unremarkable.

Aortic Atherosclerosis ([VY]-[VY]).

Remainder of exam unremarkable.

## 2016-12-30 ENCOUNTER — Other Ambulatory Visit: Payer: Self-pay | Admitting: *Deleted

## 2016-12-30 DIAGNOSIS — S20211D Contusion of right front wall of thorax, subsequent encounter: Secondary | ICD-10-CM

## 2016-12-30 MED ORDER — MELOXICAM 7.5 MG PO TABS: 7.5000 mg | ORAL_TABLET | Freq: Every day | ORAL | 0 refills | Status: DC

## 2017-01-05 ENCOUNTER — Telehealth: Payer: Self-pay | Admitting: Physician Assistant
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
## 2017-01-06 ENCOUNTER — Other Ambulatory Visit: Payer: Self-pay | Admitting: Physician Assistant

## 2017-01-06 DIAGNOSIS — R0781 Pleurodynia: Secondary | ICD-10-CM

## 2017-01-06 NOTE — Telephone Encounter (Signed)
Aware to come for xray.

## 2017-01-06 NOTE — Telephone Encounter (Signed)
Placed order for left rib series. Can come by for the films, keep future appointment.

## 2017-01-07 ENCOUNTER — Other Ambulatory Visit (INDEPENDENT_AMBULATORY_CARE_PROVIDER_SITE_OTHER): Payer: BLUE CROSS/BLUE SHIELD

## 2017-01-07 ENCOUNTER — Other Ambulatory Visit: Payer: Self-pay | Admitting: Physician Assistant

## 2017-01-07 DIAGNOSIS — R0781 Pleurodynia: Secondary | ICD-10-CM

## 2017-01-07 DIAGNOSIS — S299XXA Unspecified injury of thorax, initial encounter: Secondary | ICD-10-CM | POA: Diagnosis not present
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
## 2017-01-11 ENCOUNTER — Ambulatory Visit: Payer: BLUE CROSS/BLUE SHIELD | Admitting: Urology

## 2017-01-11 DIAGNOSIS — R3915 Urgency of urination: Secondary | ICD-10-CM | POA: Diagnosis not present

## 2017-01-11 DIAGNOSIS — R350 Frequency of micturition: Secondary | ICD-10-CM | POA: Diagnosis not present

## 2017-01-14 ENCOUNTER — Ambulatory Visit: Payer: BLUE CROSS/BLUE SHIELD | Admitting: Physician Assistant

## 2017-01-14 ENCOUNTER — Encounter: Payer: Self-pay | Admitting: Physician Assistant

## 2017-01-14 VITALS — BP 114/77 | HR 86 | Temp 97.3°F | Ht 61.0 in | Wt 113.0 lb

## 2017-01-14 DIAGNOSIS — R140 Abdominal distension (gaseous): Secondary | ICD-10-CM

## 2017-01-14 DIAGNOSIS — R1011 Right upper quadrant pain: Secondary | ICD-10-CM | POA: Diagnosis not present

## 2017-01-14 NOTE — Patient Instructions (Signed)
In a few days you may receive a survey in the mail or online from [employer] regarding your visit with us today. Please take a moment to fill this out. Your feedback is very important to our whole office. It can help us better understand your needs as well as improve your experience and satisfaction. Thank you for taking your time to complete it. We care about you.  Lovey Crupi, PA-C  

## 2017-01-15 LAB — CMP14+EGFR
ALT: 12 IU/L (ref 0–32)
AST: 11 IU/L (ref 0–40)
Albumin/Globulin Ratio: 2.7 — ABNORMAL HIGH (ref 1.2–2.2)
Albumin: 4.3 g/dL (ref 3.5–5.5)
Alkaline Phosphatase: 76 IU/L (ref 39–117)
BUN/Creatinine Ratio: 12 (ref 9–23)
BUN: 10 mg/dL (ref 6–24)
Bilirubin Total: <0.2 mg/dL (ref 0.0–1.2)
CALCIUM: 9.2 mg/dL (ref 8.7–10.2)
CO2: 23 mmol/L (ref 20–29)
CREATININE: 0.81 mg/dL (ref 0.57–1.00)
Chloride: 104 mmol/L (ref 96–106)
GFR calc Af Amer: 92 mL/min/{1.73_m2} (ref >59)
GFR calc non Af Amer: 80 mL/min/{1.73_m2} (ref >59)
Globulin, Total: 1.6 g/dL (ref 1.5–4.5)
Glucose: 76 mg/dL (ref 65–99)
Potassium: 3.9 mmol/L (ref 3.5–5.2)
Sodium: 143 mmol/L (ref 134–144)
Total Protein: 5.9 g/dL — ABNORMAL LOW (ref 6.0–8.5)

## 2017-01-15 LAB — CBC WITH DIFFERENTIAL/PLATELET
BASOS: 1 %
Basophils Absolute: 0 10*3/uL (ref 0.0–0.2)
EOS (ABSOLUTE): 0.1 10*3/uL (ref 0.0–0.4)
EOS: 2 %
HEMOGLOBIN: 13.3 g/dL (ref 11.1–15.9)
Hematocrit: 38.9 % (ref 34.0–46.6)
IMMATURE GRANS (ABS): 0 10*3/uL (ref 0.0–0.1)
IMMATURE GRANULOCYTES: 0 %
LYMPHS: 28 %
Lymphocytes Absolute: 1.4 10*3/uL (ref 0.7–3.1)
MCH: 30.5 pg (ref 26.6–33.0)
MCHC: 34.2 g/dL (ref 31.5–35.7)
MCV: 89 fL (ref 79–97)
Monocytes Absolute: 0.5 10*3/uL (ref 0.1–0.9)
Monocytes: 10 %
NEUTROS PCT: 59 %
Neutrophils Absolute: 2.9 10*3/uL (ref 1.4–7.0)
Platelets: 241 10*3/uL (ref 150–379)
RBC: 4.36 x10E6/uL (ref 3.77–5.28)
RDW: 14.3 % (ref 12.3–15.4)
WBC: 4.9 10*3/uL (ref 3.4–10.8)
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
## 2017-01-17 ENCOUNTER — Other Ambulatory Visit: Payer: Self-pay

## 2017-01-17 DIAGNOSIS — R140 Abdominal distension (gaseous): Secondary | ICD-10-CM

## 2017-01-18 ENCOUNTER — Other Ambulatory Visit: Payer: Self-pay | Admitting: *Deleted

## 2017-01-18 DIAGNOSIS — S20211D Contusion of right front wall of thorax, subsequent encounter: Secondary | ICD-10-CM

## 2017-01-18 MED ORDER — MELOXICAM 7.5 MG PO TABS: 7.5000 mg | ORAL_TABLET | Freq: Every day | ORAL | 0 refills | Status: DC

## 2017-01-20 ENCOUNTER — Ambulatory Visit (HOSPITAL_COMMUNITY): Payer: BLUE CROSS/BLUE SHIELD

## 2017-01-24 ENCOUNTER — Other Ambulatory Visit: Payer: Self-pay | Admitting: *Deleted

## 2017-01-24 MED ORDER — DEXLANSOPRAZOLE 60 MG PO CPDR: 60.0000 mg | DELAYED_RELEASE_CAPSULE | Freq: Every day | ORAL | 3 refills | Status: DC

## 2017-01-24 MED ORDER — LINACLOTIDE 145 MCG PO CAPS: 145.0000 ug | ORAL_CAPSULE | Freq: Every day | ORAL | 3 refills | Status: DC

## 2017-01-26 ENCOUNTER — Ambulatory Visit (HOSPITAL_COMMUNITY)
Admission: RE | Admit: 2017-01-26 | Discharge: 2017-01-26 | Disposition: A | Discharge status: Home / Self Care | Payer: BLUE CROSS/BLUE SHIELD | Source: Ambulatory Visit | Attending: Physician Assistant | Admitting: Physician Assistant

## 2017-01-26 DIAGNOSIS — R140 Abdominal distension (gaseous): Secondary | ICD-10-CM | POA: Diagnosis not present

## 2017-01-26 DIAGNOSIS — R109 Unspecified abdominal pain: Secondary | ICD-10-CM | POA: Diagnosis not present
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
## 2017-01-26 MED ORDER — IOPAMIDOL (ISOVUE-300) INJECTION 61%
100.0000 mL | Freq: Once | INTRAVENOUS | Status: AC | PRN
  Administered 2017-01-26: 100 mL via INTRAVENOUS

## 2017-01-28 ENCOUNTER — Telehealth: Payer: Self-pay | Admitting: Internal Medicine
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
## 2017-02-01 ENCOUNTER — Ambulatory Visit: Payer: BLUE CROSS/BLUE SHIELD | Admitting: Nurse Practitioner

## 2017-02-09 ENCOUNTER — Ambulatory Visit (INDEPENDENT_AMBULATORY_CARE_PROVIDER_SITE_OTHER): Payer: BLUE CROSS/BLUE SHIELD | Admitting: Physician Assistant

## 2017-02-09 ENCOUNTER — Encounter: Payer: Self-pay | Admitting: Physician Assistant

## 2017-02-09 VITALS — BP 124/76 | HR 87 | Ht 60.0 in | Wt 109.6 lb

## 2017-02-09 DIAGNOSIS — K2980 Duodenitis without bleeding: Secondary | ICD-10-CM

## 2017-02-09 DIAGNOSIS — R1013 Epigastric pain: Secondary | ICD-10-CM

## 2017-02-09 DIAGNOSIS — R140 Abdominal distension (gaseous): Secondary | ICD-10-CM

## 2017-02-09 MED ORDER — DICYCLOMINE HCL 10 MG PO CAPS: 10.0000 mg | ORAL_CAPSULE | ORAL | 2 refills | Status: DC | PRN

## 2017-02-09 MED ORDER — RIFAXIMIN 550 MG PO TABS: 550.0000 mg | ORAL_TABLET | Freq: Three times a day (TID) | ORAL | 0 refills | Status: DC

## 2017-02-09 NOTE — Progress Notes (Signed)
Physician assistant assessment and plans reviewed 
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
## 2017-02-09 NOTE — Patient Instructions (Signed)
Your physician has requested that you have the following lab work before leaving today:  H pylori stool antigen  Hold Dexilant 2 weeks prior to stool test.   We have sent your demographic information and a prescription for Xifaxan to [facility]. This pharmacy is able to get medication approved through insurance and get you the lowest copay possible. If you have not heard from them within 1 week, please call our office at [phone] to let us know.

## 2017-02-10 DIAGNOSIS — L609 Nail disorder, unspecified: Secondary | ICD-10-CM | POA: Diagnosis not present

## 2017-02-10 DIAGNOSIS — M79671 Pain in right foot: Secondary | ICD-10-CM | POA: Diagnosis not present

## 2017-02-10 DIAGNOSIS — S93332D Other subluxation of left foot, subsequent encounter: Secondary | ICD-10-CM | POA: Diagnosis not present

## 2017-02-10 DIAGNOSIS — S93331D Other subluxation of right foot, subsequent encounter: Secondary | ICD-10-CM | POA: Diagnosis not present

## 2017-02-15 ENCOUNTER — Telehealth: Payer: Self-pay | Admitting: Physician Assistant

## 2017-02-16 ENCOUNTER — Encounter: Payer: Self-pay | Admitting: Family Medicine

## 2017-02-16 ENCOUNTER — Encounter: Payer: Self-pay | Admitting: *Deleted

## 2017-02-16 ENCOUNTER — Ambulatory Visit: Payer: BLUE CROSS/BLUE SHIELD | Admitting: Family Medicine

## 2017-02-16 VITALS — BP 125/80 | HR 105 | Temp 98.8°F | Ht 60.0 in | Wt 109.0 lb

## 2017-02-16 DIAGNOSIS — R52 Pain, unspecified: Secondary | ICD-10-CM | POA: Diagnosis not present

## 2017-02-16 DIAGNOSIS — R6889 Other general symptoms and signs: Secondary | ICD-10-CM | POA: Diagnosis not present

## 2017-02-16 LAB — VERITOR FLU A/B WAIVED
Influenza A: NEGATIVE
Influenza B: NEGATIVE

## 2017-02-16 MED ORDER — GUAIFENESIN-CODEINE 100-10 MG/5ML PO SOLN: 5.0000 mL | ORAL | 0 refills | Status: DC | PRN
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
## 2017-02-16 NOTE — Patient Instructions (Signed)
Your flu test was negative.  I think that this is a viral upper respiratory infection.  I have prescribed you Robitussin with codeine to use for chest congestion and cough.  He may use this every 4 hours if needed.  Be advised that this does cause sedation.  Do not drive or operate heavy machinery while on this medication.  It appears that you have a viral upper respiratory infection (cold).  Cold symptoms can last up to 2 weeks.    - Get plenty of rest and drink plenty of fluids. - Try to breathe moist air. Use a cold mist humidifier. - Consume warm fluids (soup or tea) to provide relief for a stuffy nose and to loosen phlegm. - For nasal stuffiness, try saline nasal spray or a Neti Pot. Afrin nasal spray can also be used but this product should not be used longer than 3 days or it will cause rebound nasal stuffiness (worsening nasal congestion). - For sore throat pain relief: suck on throat lozenges, hard candy or popsicles; gargle with warm salt water (1/4 tsp. salt per 8 oz. of water); and eat soft, bland foods. - Eat a well-balanced diet. If you cannot, ensure you are getting enough nutrients by taking a daily multivitamin. - Avoid dairy products, as they can thicken phlegm. - Avoid alcohol, as it impairs your body's immune system.  CONTACT YOUR DOCTOR IF YOU EXPERIENCE ANY OF THE FOLLOWING: - High fever - Ear pain - Sinus-type headache - Unusually severe cold symptoms - Cough that gets worse while other cold symptoms improve - Flare up of any chronic lung problem, such as asthma - Your symptoms persist longer than 2 weeks

## 2017-02-22 ENCOUNTER — Other Ambulatory Visit (HOSPITAL_COMMUNITY)
Admission: RE | Admit: 2017-02-22 | Discharge: 2017-02-22 | Disposition: A | Discharge status: Home / Self Care | Payer: BLUE CROSS/BLUE SHIELD | Source: Ambulatory Visit | Attending: Physician Assistant | Admitting: Physician Assistant

## 2017-02-22 ENCOUNTER — Ambulatory Visit: Payer: BLUE CROSS/BLUE SHIELD | Admitting: Family Medicine

## 2017-02-22 VITALS — BP 131/78 | HR 101 | Temp 98.0°F | Ht 63.0 in | Wt 110.0 lb

## 2017-02-22 DIAGNOSIS — J40 Bronchitis, not specified as acute or chronic: Secondary | ICD-10-CM

## 2017-02-22 DIAGNOSIS — R69 Illness, unspecified: Secondary | ICD-10-CM | POA: Diagnosis not present

## 2017-02-22 MED ORDER — FLUCONAZOLE 150 MG PO TABS: 150.0000 mg | ORAL_TABLET | Freq: Once | ORAL | 0 refills | Status: AC

## 2017-02-22 MED ORDER — BENZONATATE 200 MG PO CAPS: 200.0000 mg | ORAL_CAPSULE | Freq: Two times a day (BID) | ORAL | 0 refills | Status: DC | PRN

## 2017-02-22 MED ORDER — AZITHROMYCIN 250 MG PO TABS: ORAL_TABLET | ORAL | 0 refills | Status: DC

## 2017-02-22 NOTE — Progress Notes (Signed)
Subjective: CC: bronchitis PCP: Terald Sleeper, PA-C Ruth Terry is a 62 y.o. female presenting to clinic today for:  1. Bronchitic symptoms Patient was seen 1 week ago for a 1 day history of flulike symptoms.  She had a negative influenza test.  She was thought to have a viral URI and was discharged home with Robitussin-AC and supportive home care measures.  Today she follows up noting that cough seem to respond to the Robitussin-AC initially but has since started becoming worse.  She notes that she feels like she has a lot of chest congestion but very little sputum is coming up.  When it does it has a green, dark discoloration.  She denies fevers but does note that she feels fatigued and she notes that rib pain seems to be worse.  Of note, she did have rib injuries recently.  Denies shortness of breath.   ROS: Per HPI  Allergies  Allergen Reactions  . Chlordiazepoxide-Clidinium Itching  . Ciprofloxacin Itching    Irritates stomach  . Flagyl [Metronidazole]     Severe yeast infection  . Sulfa Drugs Cross Reactors Nausea Only  . Nitrofurantoin Monohyd Macro Rash   Past Medical History:  Diagnosis Date  . Anal fissure   . Chronic constipation   . Diverticulosis 04-20-10   colonoscopy  . Gastroparesis   . GERD (gastroesophageal reflux disease)   . Hemorrhoids   . Hyperlipidemia   . IBS (irritable bowel syndrome)   . MVP (mitral valve prolapse)   . Polyp, stomach 04-20-10   egd  . Shingles   . UTI (lower urinary tract infection)     Current Outpatient Medications:  .  AMBULATORY NON FORMULARY MEDICATION, Medication Name: Domperidone 10 mg Take 1 tablet at bedtime daily., Disp: 30 tablet, Rfl: 11 .  dexlansoprazole (DEXILANT) 60 MG capsule, Take 1 capsule (60 mg total) by mouth daily., Disp: 90 capsule, Rfl: 3 .  dicyclomine (BENTYL) 10 MG capsule, Take 1 capsule (10 mg total) by mouth every 4 (four) hours as needed for spasms., Disp: 30 capsule, Rfl: 2 .   guaiFENesin-codeine 100-10 MG/5ML syrup, Take 5 mLs by mouth every 4 (four) hours as needed for cough., Disp: 120 mL, Rfl: 0 .  linaclotide (LINZESS) 145 MCG CAPS capsule, Take 1 capsule (145 mcg total) by mouth daily before breakfast., Disp: 30 capsule, Rfl: 3 .  Meth-Hyo-M Bl-Na Phos-Ph Sal (URIBEL) 118 MG CAPS, Take 1 capsule (118 mg total) by mouth 4 (four) times daily., Disp: 120 capsule, Rfl: 1 .  mupirocin cream (BACTROBAN) 2 %, Apply 1 application topically 2 (two) times daily., Disp: 30 g, Rfl: 0 .  polyethylene glycol powder (GLYCOLAX/MIRALAX) powder, Take 17 g by mouth as needed. , Disp: , Rfl:  .  Probiotic Product (ALIGN) 4 MG CAPS, Take 1 capsule by mouth daily., Disp: , Rfl:  .  psyllium (METAMUCIL) 58.6 % packet, Take 1 packet by mouth daily., Disp: , Rfl:  .  rifaximin (XIFAXAN) 550 MG TABS tablet, Take 1 tablet (550 mg total) by mouth 3 (three) times daily., Disp: 42 tablet, Rfl: 0 .  Simethicone (PHAZYME MAXIMUM STRENGTH) 250 MG CAPS, Take 250 mg by mouth 3 (three) times daily., Disp: 14 capsule, Rfl:  .  valACYclovir (VALTREX) 1000 MG tablet, Take 1 tablet (1,000 mg total) by mouth at bedtime., Disp: 90 tablet, Rfl: 4 Social History   Socioeconomic History  . Marital status: Married    Spouse name: Not on file  . Number  of children: 0  . Years of education: Not on file  . Highest education level: Not on file  Social Needs  . Financial resource strain: Not on file  . Food insecurity - worry: Not on file  . Food insecurity - inability: Not on file  . Transportation needs - medical: Not on file  . Transportation needs - non-medical: Not on file  Occupational History  . Occupation: Mudlogger    Comment: Economist: [employer]  Tobacco Use  . Smoking status: Former Smoker    Packs/day: 0.50    Years: 6.00    Pack years: 3.00    Types: Cigarettes  . Smokeless tobacco: Never Used  . Tobacco comment: USE SMOKELESS CIGARETTES   Substance and Sexual Activity  . Alcohol use: Yes    Alcohol/week: 0.0 oz    Comment: 1 a week  . Drug use: No  . Sexual activity: Not on file  Other Topics Concern  . Not on file  Social History Narrative  . Not on file   Family History  Problem Relation Age of Onset  . Hypertension Mother   . Diabetes Father   . Heart attack Father        Age 14    Objective: Office vital signs reviewed. BP 131/78   Pulse (!) 101   Temp 98 F (36.7 C) (Oral)   Ht 5\' 3"  (1.6 m)   Wt 110 lb (49.9 kg)   SpO2 96%   BMI 19.49 kg/m   Physical Examination:  General: Awake, alert, nontoxic, No acute distress HEENT: MMM Cardio: regular rate and rhythm, S1S2 heard, no murmurs appreciated Pulm: clear to auscultation bilaterally, no wheezes, rhonchi or rales; normal work of breathing on room air; coughing intermittently during the exam.  Assessment/ Plan: 62 y.o. female   1. Bronchitis Patient is afebrile with normal heart rate on my exam.  Normal O2 saturation with normal respiratory rate.  She is coughing intermittently during exam.  Cough appears to be a deep bronchial cough.  Because of duration of symptoms and relative worsening of symptoms, I have elected to proceed with antibiotics.  Z-Pak, Tessalon Perles and Diflucan tablets sent to pharmacy.  Home care instructions were reviewed with the patient.  Work note was provided.  If symptoms do not improve or continue to worsen, would check x-ray.  Reasons for return and emergent evaluation emergency department reviewed with patient, who voiced good understanding.  Meds ordered this encounter  Medications  . azithromycin (ZITHROMAX Z-PAK) 250 MG tablet    Sig: As directed    Dispense:  6 tablet    Refill:  0  . benzonatate (TESSALON) 200 MG capsule    Sig: Take 1 capsule (200 mg total) by mouth 2 (two) times daily as needed for cough.    Dispense:  20 capsule    Refill:  0  . fluconazole (DIFLUCAN) 150 MG tablet    Sig: Take 1 tablet  (150 mg total) by mouth once for 1 dose.    Dispense:  1 tablet    Refill:  [place], DO [place] [phone]

## 2017-02-22 NOTE — Patient Instructions (Signed)
I have sent you in a Z-Pak, Tessalon Perles and Diflucan to the [facility] pharmacy.  If your symptoms are worsening, you develop shortness of breath, you cough up blood, you develop high fevers, please seek immediate medical attention.   Acute Bronchitis, Adult Acute bronchitis is when air tubes (bronchi) in the lungs suddenly get swollen. The condition can make it hard to breathe. It can also cause these symptoms:  A cough.  Coughing up clear, yellow, or green mucus.  Wheezing.  Chest congestion.  Shortness of breath.  A fever.  Body aches.  Chills.  A sore throat.  Follow these instructions at home: Medicines  Take over-the-counter and prescription medicines only as told by your doctor.  If you were prescribed an antibiotic medicine, take it as told by your doctor. Do not stop taking the antibiotic even if you start to feel better. General instructions  Rest.  Drink enough fluids to keep your pee (urine) clear or pale yellow.  Avoid smoking and secondhand smoke. If you smoke and you need help quitting, ask your doctor. Quitting will help your lungs heal faster.  Use an inhaler, cool mist vaporizer, or humidifier as told by your doctor.  Keep all follow-up visits as told by your doctor. This is important. How is this prevented? To lower your risk of getting this condition again:  Wash your hands often with soap and water. If you cannot use soap and water, use hand sanitizer.  Avoid contact with people who have cold symptoms.  Try not to touch your hands to your mouth, nose, or eyes.  Make sure to get the flu shot every year.  Contact a doctor if:  Your symptoms do not get better in 2 weeks. Get help right away if:  You cough up blood.  You have chest pain.  You have very bad shortness of breath.  You become dehydrated.  You faint (pass out) or keep feeling like you are going to pass out.  You keep throwing up (vomiting).  You have a very bad  headache.  Your fever or chills gets worse. This information is not intended to replace advice given to you by your health care provider. Make sure you discuss any questions you have with your health care provider. Document Released: 06/09/2007 Document Revised: 07/30/2015 Document Reviewed: 06/11/2015 Elsevier Interactive Patient Education  [employer].

## 2017-02-23 LAB — H. PYLORI ANTIGEN, STOOL: H. PYLORI STOOL AG, EIA: NEGATIVE

## 2017-02-24 ENCOUNTER — Telehealth: Payer: Self-pay | Admitting: Physician Assistant

## 2017-02-24 NOTE — Telephone Encounter (Signed)
Please review and advise.

## 2017-02-24 NOTE — Telephone Encounter (Signed)
Pt no better from last vist.  She c/o cough, congestion, fatigue and weak  Denies fever. Pt has been on the abx and cough med for 48 hours. She thinks she needs a chest xray.  Been out of work for 2 weeks off and on. Please advise.

## 2017-02-25 ENCOUNTER — Other Ambulatory Visit: Payer: Self-pay | Admitting: Physician Assistant

## 2017-02-25 ENCOUNTER — Telehealth: Payer: Self-pay | Admitting: Emergency Medicine

## 2017-02-25 ENCOUNTER — Telehealth: Payer: Self-pay | Admitting: Physician Assistant

## 2017-02-25 ENCOUNTER — Other Ambulatory Visit: Payer: BLUE CROSS/BLUE SHIELD

## 2017-02-25 DIAGNOSIS — R05 Cough: Secondary | ICD-10-CM

## 2017-02-25 DIAGNOSIS — R059 Cough, unspecified: Secondary | ICD-10-CM

## 2017-02-25 NOTE — Telephone Encounter (Signed)
If she would like a chest xray, she can come in for one.  I'll be happy to order if she is worried.

## 2017-02-25 NOTE — Telephone Encounter (Signed)
Received fax from [facility] that patients Xifaxan was approved through 10/01/17 but is restricted to be filled at [facility]. The prescription was transferred there.

## 2017-02-25 NOTE — Telephone Encounter (Signed)
Order placed

## 2017-02-25 NOTE — Telephone Encounter (Signed)
Patient aware that xray order has been placed.

## 2017-02-28 ENCOUNTER — Other Ambulatory Visit (INDEPENDENT_AMBULATORY_CARE_PROVIDER_SITE_OTHER): Payer: BLUE CROSS/BLUE SHIELD

## 2017-02-28 DIAGNOSIS — R05 Cough: Secondary | ICD-10-CM | POA: Diagnosis not present

## 2017-02-28 DIAGNOSIS — R059 Cough, unspecified: Secondary | ICD-10-CM

## 2017-02-28 IMAGING — DX DG CHEST 2V
2 series · 2 of 2 positions shown · non-contrast
Comparison: [DATE]

CLINICAL DATA: Cough and congestion

EXAM:
CHEST  2 VIEW

[chest pa]
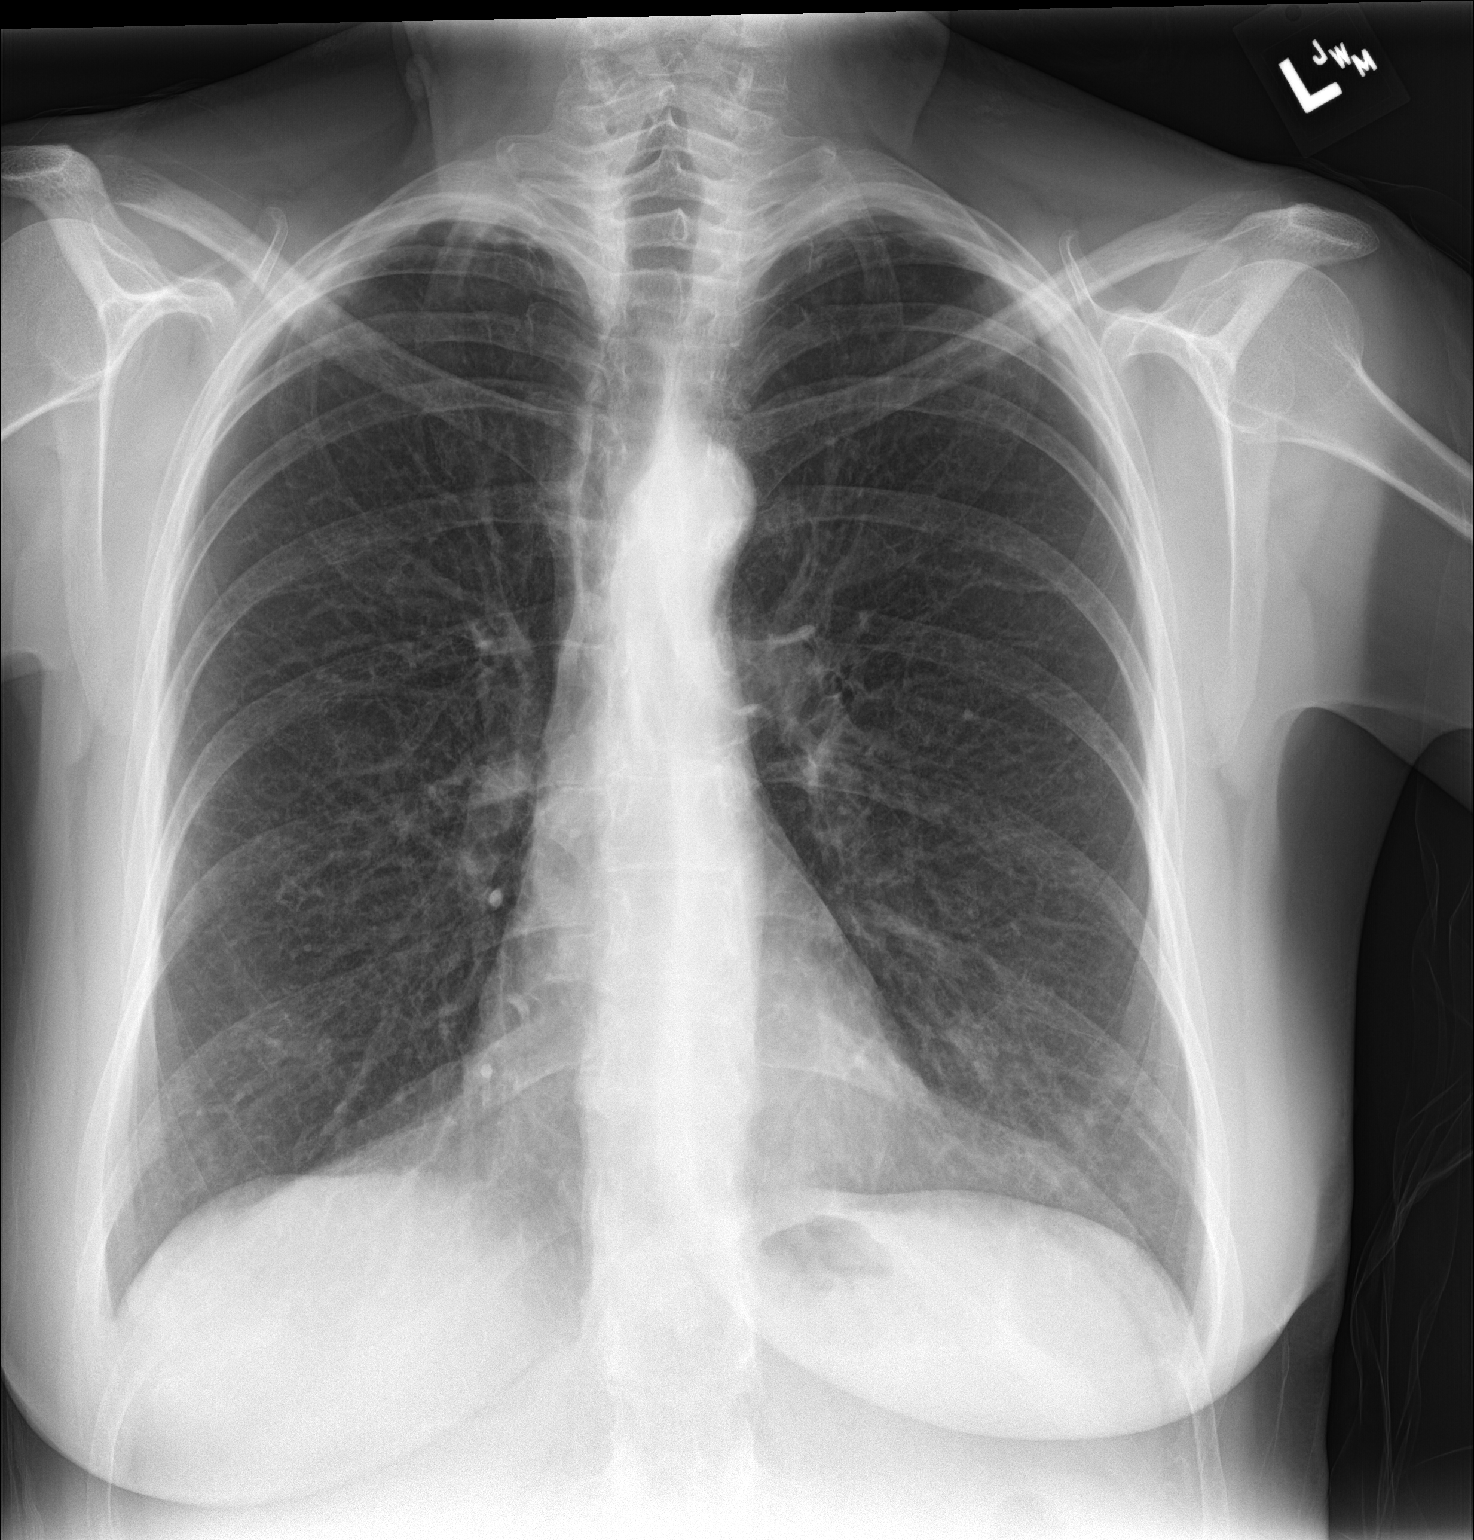

[chest lat]
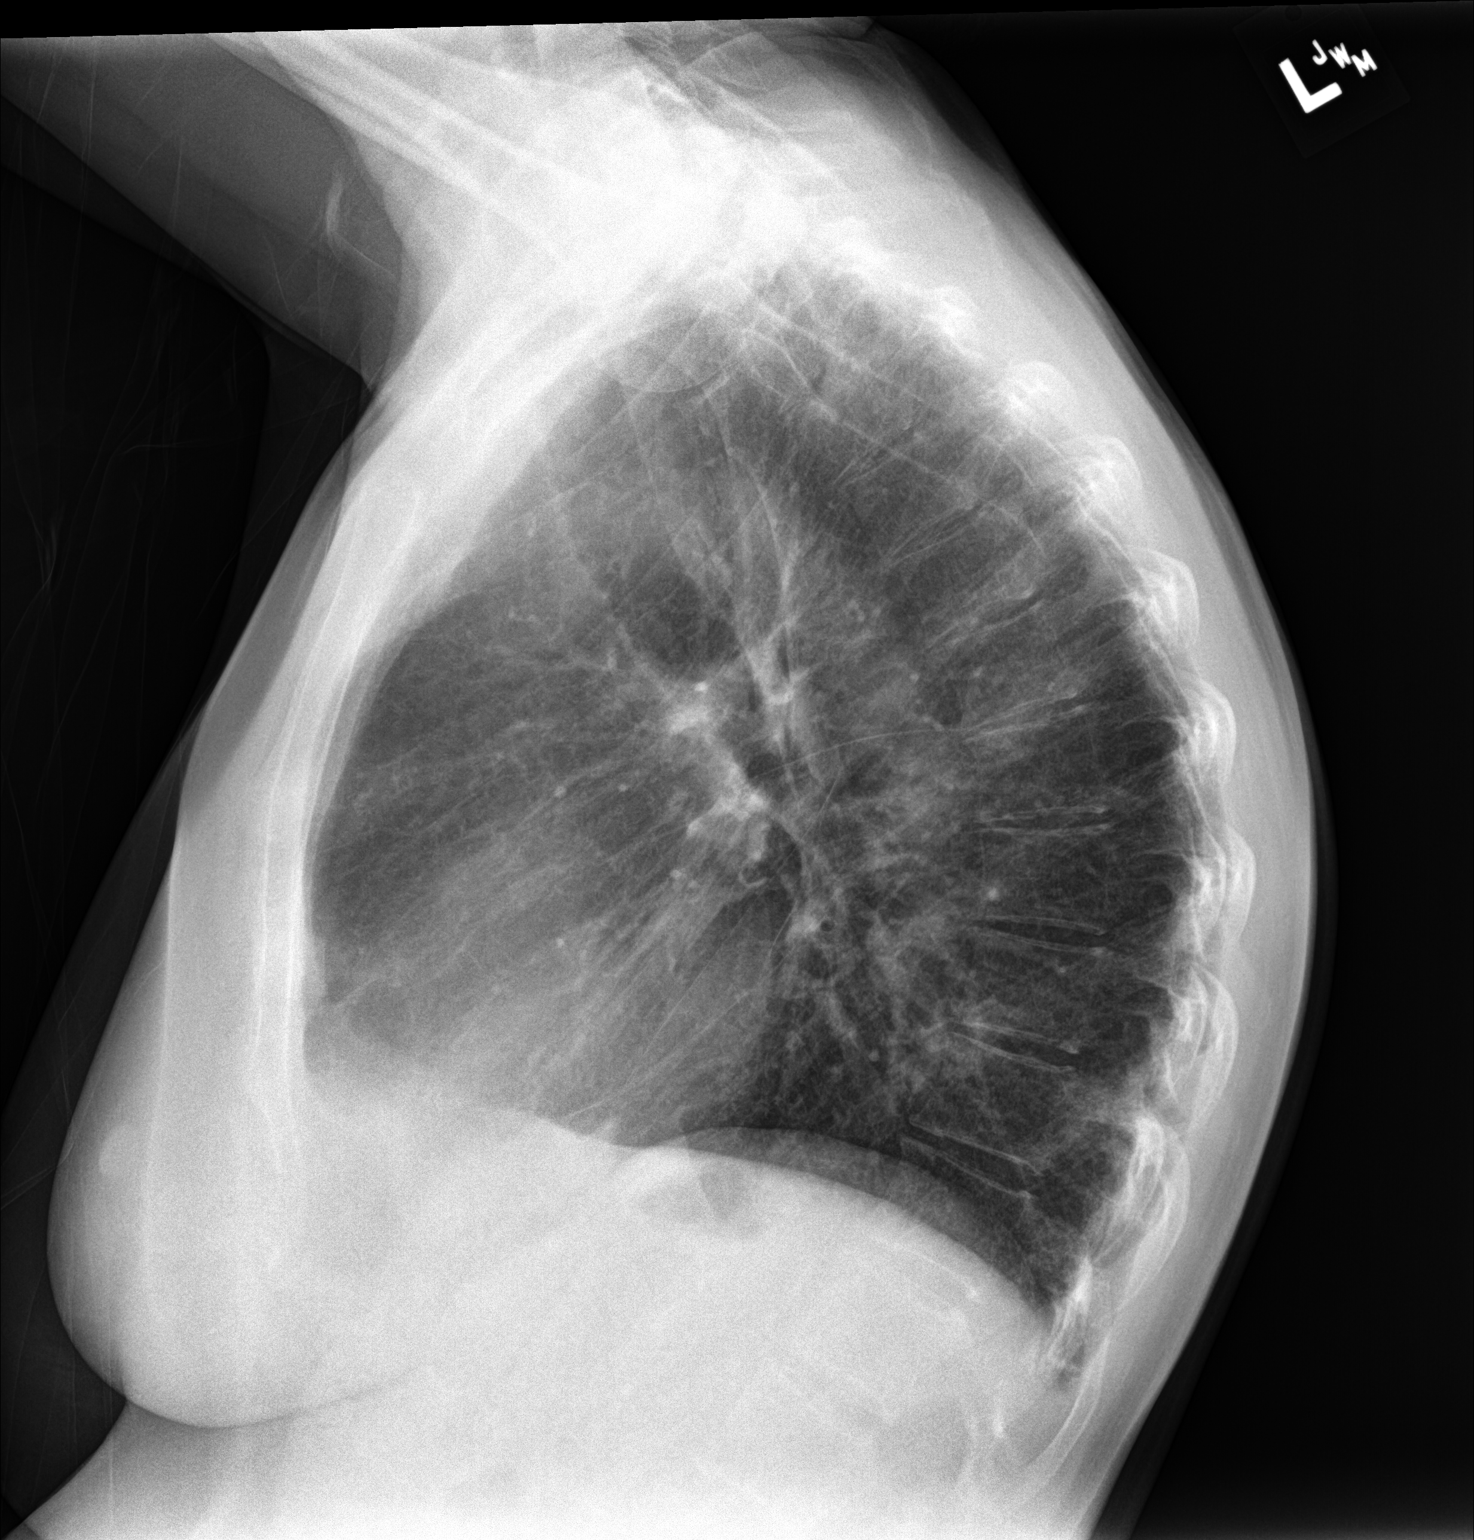

[2 of 2 positions shown; findings below may reference images not displayed]

FINDINGS: Lungs are somewhat hyperexpanded without edema or consolidation. The
heart size and pulmonary vascularity are normal. No adenopathy.
There is aortic atherosclerosis. No evident bone lesions.
IMPRESSION: Lungs hyperexpanded without edema or consolidation. There is aortic
atherosclerosis.

Aortic Atherosclerosis ([LB]-[LB]).

## 2017-03-01 ENCOUNTER — Encounter: Payer: Self-pay | Admitting: Physician Assistant

## 2017-03-01 ENCOUNTER — Ambulatory Visit: Payer: BLUE CROSS/BLUE SHIELD | Admitting: Physician Assistant

## 2017-03-01 VITALS — BP 110/66 | HR 93 | Temp 97.4°F | Ht 63.0 in | Wt 106.6 lb

## 2017-03-01 DIAGNOSIS — J40 Bronchitis, not specified as acute or chronic: Secondary | ICD-10-CM

## 2017-03-01 DIAGNOSIS — I700 Atherosclerosis of aorta: Secondary | ICD-10-CM

## 2017-03-01 MED ORDER — CEFDINIR 300 MG PO CAPS: 300.0000 mg | ORAL_CAPSULE | Freq: Two times a day (BID) | ORAL | 0 refills | Status: DC

## 2017-03-01 MED ORDER — GUAIFENESIN-CODEINE 100-10 MG/5ML PO SOLN: 5.0000 mL | ORAL | 0 refills | Status: DC | PRN

## 2017-03-01 MED ORDER — BENZONATATE 200 MG PO CAPS: 200.0000 mg | ORAL_CAPSULE | Freq: Two times a day (BID) | ORAL | 5 refills | Status: DC | PRN

## 2017-03-01 NOTE — Progress Notes (Signed)
BP 110/66   Pulse 93   Temp (!) 97.4 F (36.3 C) (Oral)   Ht 5\' 3"  (1.6 m)   Wt 106 lb 9.6 oz (48.4 kg)   BMI 18.88 kg/m    Subjective:    Patient ID: Ruth Terry, female    DOB: 08-28-1955, 62 y.o.   MRN: [id number]  HPI: Ruth Terry is a 62 y.o. female presenting on 03/01/2017 for Discuss xray  Patient with several days of progressing upper respiratory and bronchial symptoms. Initially there was more upper respiratory congestion. This progressed to having significant cough that is productive throughout the day and severe at night. There is occasional wheezing after coughing. Sometimes there is slight dyspnea on exertion. It is productive mucus that is yellow in color. Denies any blood.  CXR with emphysema changes and aortic atherosclerosis. All findings are discussed. Consider Dr. Sherene Sires for future referral.  Past Medical History:  Diagnosis Date  . Anal fissure   . Chronic constipation   . Diverticulosis 04-20-10   colonoscopy  . Gastroparesis   . GERD (gastroesophageal reflux disease)   . Hemorrhoids   . Hyperlipidemia   . IBS (irritable bowel syndrome)   . MVP (mitral valve prolapse)   . Polyp, stomach 04-20-10   egd  . Shingles   . UTI (lower urinary tract infection)    Relevant past medical, surgical, family and social history reviewed and updated as indicated. Interim medical history since our last visit reviewed. Allergies and medications reviewed and updated. DATA REVIEWED: CHART IN EPIC  Family History reviewed for pertinent findings.  Review of Systems  Constitutional: Positive for chills and fatigue. Negative for activity change and appetite change.  HENT: Positive for congestion, postnasal drip and sore throat.   Eyes: Negative.   Respiratory: Positive for cough, shortness of breath and wheezing.   Cardiovascular: Negative.  Negative for chest pain, palpitations and leg swelling.  Gastrointestinal: Negative.   Genitourinary: Negative.     Musculoskeletal: Negative.   Skin: Negative.   Neurological: Positive for headaches.    Allergies as of 03/01/2017      Reactions   Chlordiazepoxide-clidinium Itching   Ciprofloxacin Itching   Irritates stomach   Flagyl [metronidazole]    Severe yeast infection   Sulfa Drugs Cross Reactors Nausea Only   Nitrofurantoin Monohyd Macro Rash      Medication List        Accurate as of 03/01/17  2:59 PM. Always use your most recent med list.          ALIGN 4 MG Caps Take 1 capsule by mouth daily.   AMBULATORY NON FORMULARY MEDICATION Medication Name: Domperidone 10 mg Take 1 tablet at bedtime daily.   benzonatate 200 MG capsule Commonly known as:  TESSALON Take 1 capsule (200 mg total) by mouth 2 (two) times daily as needed for cough.   cefdinir 300 MG capsule Commonly known as:  OMNICEF Take 1 capsule (300 mg total) by mouth 2 (two) times daily. 1 po BID   dexlansoprazole 60 MG capsule Commonly known as:  DEXILANT Take 1 capsule (60 mg total) by mouth daily.   dicyclomine 10 MG capsule Commonly known as:  BENTYL Take 1 capsule (10 mg total) by mouth every 4 (four) hours as needed for spasms.   guaiFENesin-codeine 100-10 MG/5ML syrup Take 5 mLs by mouth every 4 (four) hours as needed for cough.   linaclotide 145 MCG Caps capsule Commonly known as:  LINZESS Take 1 capsule (145  mcg total) by mouth daily before breakfast.   mupirocin cream 2 % Commonly known as:  BACTROBAN Apply 1 application topically 2 (two) times daily.   polyethylene glycol powder powder Commonly known as:  GLYCOLAX/MIRALAX Take 17 g by mouth as needed.   psyllium 58.6 % packet Commonly known as:  METAMUCIL Take 1 packet by mouth daily.   rifaximin 550 MG Tabs tablet Commonly known as:  XIFAXAN Take 1 tablet (550 mg total) by mouth 3 (three) times daily.   Simethicone 250 MG Caps Commonly known as:  PHAZYME MAXIMUM STRENGTH Take 250 mg by mouth 3 (three) times daily.   URIBEL 118 MG  Caps Take 1 capsule (118 mg total) by mouth 4 (four) times daily.   valACYclovir 1000 MG tablet Commonly known as:  VALTREX Take 1 tablet (1,000 mg total) by mouth at bedtime.          Objective:    BP 110/66   Pulse 93   Temp (!) 97.4 F (36.3 C) (Oral)   Ht 5\' 3"  (1.6 m)   Wt 106 lb 9.6 oz (48.4 kg)   BMI 18.88 kg/m   Allergies  Allergen Reactions  . Chlordiazepoxide-Clidinium Itching  . Ciprofloxacin Itching    Irritates stomach  . Flagyl [Metronidazole]     Severe yeast infection  . Sulfa Drugs Cross Reactors Nausea Only  . Nitrofurantoin Monohyd Macro Rash    Wt Readings from Last 3 Encounters:  03/01/17 106 lb 9.6 oz (48.4 kg)  02/22/17 110 lb (49.9 kg)  02/16/17 109 lb (49.4 kg)    Physical Exam  Constitutional: She is oriented to person, place, and time. She appears well-developed and well-nourished.  HENT:  Head: Normocephalic and atraumatic.  Right Ear: There is drainage and tenderness.  Left Ear: There is drainage and tenderness.  Nose: Mucosal edema and rhinorrhea present. Right sinus exhibits no maxillary sinus tenderness and no frontal sinus tenderness. Left sinus exhibits no maxillary sinus tenderness and no frontal sinus tenderness.  Mouth/Throat: Oropharyngeal exudate and posterior oropharyngeal erythema present.  Eyes: Conjunctivae and EOM are normal. Pupils are equal, round, and reactive to light.  Neck: Normal range of motion. Neck supple.  Cardiovascular: Normal rate, regular rhythm, normal heart sounds and intact distal pulses.  Pulmonary/Chest: Effort normal. She has wheezes in the right upper field and the left upper field.  Abdominal: Soft. Bowel sounds are normal.  Neurological: She is alert and oriented to person, place, and time. She has normal reflexes.  Skin: Skin is warm and dry. No rash noted.  Psychiatric: She has a normal mood and affect. Her behavior is normal. Judgment and thought content normal.    Results for orders placed  or performed during the hospital encounter of 02/22/17  H. pylori antigen, stool  Result Value Ref Range   H. Pylori Stool Ag, Eia Negative Negative      Assessment & Plan:   1. Bronchitis - cefdinir (OMNICEF) 300 MG capsule; Take 1 capsule (300 mg total) by mouth 2 (two) times daily. 1 po BID  Dispense: 20 capsule; Refill: 0 - benzonatate (TESSALON) 200 MG capsule; Take 1 capsule (200 mg total) by mouth 2 (two) times daily as needed for cough.  Dispense: 20 capsule; Refill: 5 - guaiFENesin-codeine 100-10 MG/5ML syrup; Take 5 mLs by mouth every 4 (four) hours as needed for cough.  Dispense: 120 mL; Refill: 0  Stop Smoking Consider pulmonology, Dr Sherene Sires   2. Aortic atherosclerosis (HCC)    Continue all  other maintenance medications as listed above.  Follow up plan: Return if symptoms worsen or fail to improve.  Educational handout given for survey  Remus Loffler PA-C [facility] [address]  [place], [place] [address] [phone]   03/01/2017, 2:59 PM

## 2017-03-01 NOTE — Patient Instructions (Signed)
In a few days you may receive a survey in the mail or online from [employer] regarding your visit with us today. Please take a moment to fill this out. Your feedback is very important to our whole office. It can help us better understand your needs as well as improve your experience and satisfaction. Thank you for taking your time to complete it. We care about you.  Betty Brooks, PA-C  

## 2017-03-07 ENCOUNTER — Telehealth: Payer: Self-pay | Admitting: Physician Assistant

## 2017-03-07 ENCOUNTER — Other Ambulatory Visit: Payer: Self-pay | Admitting: Physician Assistant

## 2017-03-07 MED ORDER — FLUCONAZOLE 150 MG PO TABS: ORAL_TABLET | ORAL | 2 refills | Status: DC

## 2017-03-07 NOTE — Telephone Encounter (Signed)
Tell her to go but nicotine patches, lowest strength, use one daily for 2-4 weeks. 7 mg  Sending diflucan

## 2017-03-07 NOTE — Telephone Encounter (Signed)
Patient states that she stopped smoking 3 days ago and she is having really bad headaches and tylenol is not helping it. Patient is also requesting a diflucan. Please advise

## 2017-03-07 NOTE — Telephone Encounter (Signed)
Patient aware of recommendation.  

## 2017-03-09 NOTE — Telephone Encounter (Signed)
Returned patients call- patient states that she has been having diarrhea and a headache on the right side of her nose and under her eye for 72 hours.  States she did think it was from her stopping smoking but she has started again and the headache has not went away.  Patient seen Glenard Haring 2/26 and is still taking the Cefdinir.  States she does not want another antibiotic but wants to know what she should do. Covering PCP- please advise

## 2017-03-09 NOTE — Telephone Encounter (Signed)
Pt wants to talk to nurse about stomach and headache issues. Please call back . Offered appt but wants to talk to nurse first

## 2017-03-09 NOTE — Telephone Encounter (Signed)
Apt made

## 2017-03-09 NOTE — Telephone Encounter (Signed)
Pt. Needs to be seen for this. Thanks, WS 

## 2017-03-10 ENCOUNTER — Ambulatory Visit: Payer: BLUE CROSS/BLUE SHIELD | Admitting: Pediatrics

## 2017-03-10 ENCOUNTER — Encounter: Payer: Self-pay | Admitting: Pediatrics

## 2017-03-10 VITALS — BP 130/81 | HR 117 | Temp 98.4°F | Ht 63.0 in | Wt 106.6 lb

## 2017-03-10 DIAGNOSIS — J069 Acute upper respiratory infection, unspecified: Secondary | ICD-10-CM | POA: Diagnosis not present

## 2017-03-10 DIAGNOSIS — R6889 Other general symptoms and signs: Secondary | ICD-10-CM | POA: Diagnosis not present

## 2017-03-10 LAB — VERITOR FLU A/B WAIVED
INFLUENZA B: NEGATIVE
Influenza A: NEGATIVE

## 2017-03-10 NOTE — Patient Instructions (Signed)
Fever reducer and headache: tylenol   Sinus pressure:  Nasal steroid such as flonase/fluticaone or nasocort daily Can also take daily antihistamine such as loratadine/claritin or cetirizine/zyrtec  Sinus rinses/irritation: Netipot or similar with distilled water 2-3 times a day to clear out sinuses or Normal saline nasal spray  Sore throat:  Throat lozenges chloroseptic spray  Stick with bland foods Drink lots of fluids  

## 2017-03-10 NOTE — Progress Notes (Signed)
Subjective:   Patient ID: Ruth Terry, female    DOB: 1955-12-23, 62 y.o.   MRN: [id number] CC: Facial Pain; Nasal Congestion; and Diarrhea  HPI: Ruth Terry is a 62 y.o. female presenting for Facial Pain; Nasal Congestion; and Diarrhea  Has had URI symptoms off and on for past 3.5 weeks. Treated with azithromycin 3 weeks ago. Started on cefdinir last week. Has two days left of cefdinir. Two days ago started having worsening R sided max sinus pain. Also with R ear pain. Had some nausea, no emesis, some diarrhea past two days. 7 episodes first day. 4 episodes loose stool so far today. No abd pain. Taking tylenol at home.   Relevant past medical, surgical, family and social history reviewed. Allergies and medications reviewed and updated. Social History   Tobacco Use  Smoking Status Former Smoker  . Packs/day: 0.50  . Years: 6.00  . Pack years: 3.00  . Types: Cigarettes  Smokeless Tobacco Never Used  Tobacco Comment   USE SMOKELESS CIGARETTES   ROS: Per HPI   Objective:    BP 130/81   Pulse (!) 117   Temp 98.4 F (36.9 C) (Oral)   Ht 5\' 3"  (1.6 m)   Wt 106 lb 9.6 oz (48.4 kg)   BMI 18.88 kg/m   Wt Readings from Last 3 Encounters:  03/10/17 106 lb 9.6 oz (48.4 kg)  03/01/17 106 lb 9.6 oz (48.4 kg)  02/22/17 110 lb (49.9 kg)    Gen: NAD, alert, cooperative with exam, NCAT EYES: EOMI, no conjunctival injection, or no icterus ENT:  TMs dull gray b/l, OP with mild erythema LYMPH: no cervical LAD CV: NRRR, normal S1/S2, no murmur, distal pulses 2+ b/l Resp: CTABL, no wheezes, normal WOB Abd: +BS, soft, NTND. no guarding or organomegaly Ext: No edema, warm Neuro: Alert and oriented MSK: normal muscle bulk  Assessment & Plan:  Ruth Terry was seen today for facial pain, nasal congestion, emesis and diarrhea.  Diagnoses and all orders for this visit:  Sinusitis  Cont cefdinir. Discussed flonase, sinus rinses, antihistamine. If not improving will need to see  ENT.  Flu-like symptoms Flu neg -     Veritor Flu A/B Waived  Acute URI Symptom care, return precautions discussed  Follow up plan: Return if symptoms worsen or fail to improve. [name], MD [facility]

## 2017-03-11 ENCOUNTER — Telehealth: Payer: Self-pay | Admitting: Physician Assistant

## 2017-03-11 ENCOUNTER — Telehealth: Payer: Self-pay | Admitting: Pediatrics

## 2017-03-11 NOTE — Telephone Encounter (Signed)
That is fine  thanks

## 2017-03-11 NOTE — Telephone Encounter (Signed)
Spoke to pharmacy and clarified prescription.

## 2017-03-11 NOTE — Telephone Encounter (Signed)
Patient states that the antibiotics is making her nausea, diarrhea, and vomiting. Patient states that she wants to stop the antibiotic and just try the flonase, netty pot, and zyrtec. Patient advised to stop antibiotic and I would let you know.

## 2017-03-13 ENCOUNTER — Encounter (HOSPITAL_COMMUNITY): Payer: Self-pay | Admitting: Emergency Medicine

## 2017-03-13 ENCOUNTER — Other Ambulatory Visit: Payer: Self-pay

## 2017-03-13 ENCOUNTER — Emergency Department (HOSPITAL_COMMUNITY)
Admission: EM | Admit: 2017-03-13 | Discharge: 2017-03-13 | Disposition: A | Discharge status: Home / Self Care | Payer: BLUE CROSS/BLUE SHIELD | Attending: Emergency Medicine | Admitting: Emergency Medicine

## 2017-03-13 DIAGNOSIS — I2510 Atherosclerotic heart disease of native coronary artery without angina pectoris: Secondary | ICD-10-CM | POA: Diagnosis not present

## 2017-03-13 DIAGNOSIS — R21 Rash and other nonspecific skin eruption: Secondary | ICD-10-CM | POA: Diagnosis not present

## 2017-03-13 DIAGNOSIS — E860 Dehydration: Secondary | ICD-10-CM | POA: Insufficient documentation

## 2017-03-13 DIAGNOSIS — E876 Hypokalemia: Secondary | ICD-10-CM | POA: Insufficient documentation

## 2017-03-13 DIAGNOSIS — I482 Chronic atrial fibrillation: Secondary | ICD-10-CM | POA: Diagnosis not present

## 2017-03-13 DIAGNOSIS — Z79899 Other long term (current) drug therapy: Secondary | ICD-10-CM | POA: Diagnosis not present

## 2017-03-13 DIAGNOSIS — Z87891 Personal history of nicotine dependence: Secondary | ICD-10-CM | POA: Insufficient documentation

## 2017-03-13 DIAGNOSIS — R197 Diarrhea, unspecified: Secondary | ICD-10-CM | POA: Diagnosis not present

## 2017-03-13 LAB — CBC WITH DIFFERENTIAL/PLATELET
BASOS ABS: 0 10*3/uL (ref 0.0–0.1)
BASOS PCT: 0 %
EOS PCT: 2 %
Eosinophils Absolute: 0.1 10*3/uL (ref 0.0–0.7)
HEMATOCRIT: 41.5 % (ref 36.0–46.0)
Hemoglobin: 13.8 g/dL (ref 12.0–15.0)
Lymphocytes Relative: 23 %
Lymphs Abs: 1.3 10*3/uL (ref 0.7–4.0)
MCH: 29.9 pg (ref 26.0–34.0)
MCHC: 33.3 g/dL (ref 30.0–36.0)
MCV: 89.8 fL (ref 78.0–100.0)
MONO ABS: 0.3 10*3/uL (ref 0.1–1.0)
MONOS PCT: 5 %
NEUTROS ABS: 4 10*3/uL (ref 1.7–7.7)
Neutrophils Relative %: 70 %
PLATELETS: 191 10*3/uL (ref 150–400)
RBC: 4.62 MIL/uL (ref 3.87–5.11)
RDW: 13.4 % (ref 11.5–15.5)
WBC: 5.6 10*3/uL (ref 4.0–10.5)

## 2017-03-13 LAB — C DIFFICILE QUICK SCREEN W PCR REFLEX
C Diff antigen: NEGATIVE
C Diff interpretation: NOT DETECTED
C Diff toxin: NEGATIVE

## 2017-03-13 LAB — COMPREHENSIVE METABOLIC PANEL
ALBUMIN: 3.2 g/dL — AB (ref 3.5–5.0)
ALT: 14 U/L (ref 14–54)
ANION GAP: 9 (ref 5–15)
AST: 14 U/L — AB (ref 15–41)
Alkaline Phosphatase: 83 U/L (ref 38–126)
BUN: 5 mg/dL — AB (ref 6–20)
CALCIUM: 8.8 mg/dL — AB (ref 8.9–10.3)
CO2: 26 mmol/L (ref 22–32)
Chloride: 104 mmol/L (ref 101–111)
Creatinine, Ser: 0.57 mg/dL (ref 0.44–1.00)
GFR calc Af Amer: >60 mL/min (ref >60)
GFR calc non Af Amer: >60 mL/min (ref >60)
GLUCOSE: 114 mg/dL — AB (ref 65–99)
Potassium: 3.2 mmol/L — ABNORMAL LOW (ref 3.5–5.1)
SODIUM: 139 mmol/L (ref 135–145)
TOTAL PROTEIN: 6.4 g/dL — AB (ref 6.5–8.1)
Total Bilirubin: 0.5 mg/dL (ref 0.3–1.2)

## 2017-03-13 MED ORDER — SODIUM CHLORIDE 0.9 % IV BOLUS (SEPSIS)
2000.0000 mL | Freq: Once | INTRAVENOUS | Status: AC
  Administered 2017-03-13: 2000 mL via INTRAVENOUS

## 2017-03-13 MED ORDER — POTASSIUM CHLORIDE CRYS ER 20 MEQ PO TBCR
40.0000 meq | EXTENDED_RELEASE_TABLET | Freq: Once | ORAL | Status: AC
  Administered 2017-03-13: 40 meq via ORAL
  Filled 2017-03-13: qty 2
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
## 2017-03-13 NOTE — Discharge Instructions (Signed)
Take Imodium as directed for diarrhea.  Avoid milk or foods containing milk such as cheese or ice cream while having diarrhea.  Take Tylenol as directed for aches.Make sure that you drink at least six 8 ounce glasses of water or Gatorade each day in order to stay well-hydrated.  See your primary care physician if you do not continue to improve in the next 3 or 4 days.  Return for fainting, lightheadedness or if you feel worse for any reason.

## 2017-03-13 NOTE — ED Triage Notes (Signed)
Pt has been having diarrhea with some weakness. She has been on antibiotics for uri and bronchitis. She has a rash on her knees and elbows that started within the last 24 hours

## 2017-03-14 ENCOUNTER — Telehealth: Payer: Self-pay | Admitting: Internal Medicine

## 2017-03-14 NOTE — Telephone Encounter (Signed)
The pt has been advised to try imodium as needed and continue align.  Her c diff test was negative.  She has been on recent abx and was concerned about C diff.  She has an appt with Dr Henrene Pastor in a couple weeks and will keep that appt

## 2017-03-15 ENCOUNTER — Ambulatory Visit: Payer: BLUE CROSS/BLUE SHIELD | Admitting: Physician Assistant

## 2017-03-15 ENCOUNTER — Encounter: Payer: Self-pay | Admitting: Physician Assistant

## 2017-03-15 VITALS — BP 135/85 | HR 83 | Temp 96.9°F | Ht 60.0 in | Wt 104.6 lb

## 2017-03-15 DIAGNOSIS — H60391 Other infective otitis externa, right ear: Secondary | ICD-10-CM

## 2017-03-15 MED ORDER — CIPROFLOXACIN-DEXAMETHASONE 0.3-0.1 % OT SUSP: 4.0000 [drp] | Freq: Two times a day (BID) | OTIC | 0 refills | Status: DC

## 2017-03-16 ENCOUNTER — Telehealth: Payer: Self-pay | Admitting: Physician Assistant

## 2017-03-16 ENCOUNTER — Other Ambulatory Visit: Payer: Self-pay | Admitting: Physician Assistant

## 2017-03-16 MED ORDER — NEOMYCIN-POLYMYXIN-HC 3.5-10000-1 OT SUSP: 4.0000 [drp] | Freq: Four times a day (QID) | OTIC | 0 refills | Status: DC

## 2017-03-16 NOTE — Telephone Encounter (Signed)
Cortisporin drops have been sent.

## 2017-03-16 NOTE — Telephone Encounter (Signed)
Patient aware.

## 2017-03-17 ENCOUNTER — Telehealth: Payer: Self-pay

## 2017-03-17 MED ORDER — NEOMYCIN-POLYMYXIN-HC 3.5-10000-1 OT SUSP: 4.0000 [drp] | Freq: Four times a day (QID) | OTIC | 0 refills | Status: DC

## 2017-03-17 NOTE — Telephone Encounter (Signed)
[facility] sending message over that Ciprodex Otic is not covered and Neo/poly/hydrocort Otic sus is covered   Ruth Terry  patient

## 2017-03-17 NOTE — Telephone Encounter (Signed)
Covering for PCP  This appears to bea duplicate- She sent in cortisorin 1 day ago which the pharmacy is recommending for coverage.   Laroy Apple, MD [facility] 03/17/2017, 12:35 PM

## 2017-03-18 NOTE — Progress Notes (Signed)
BP 135/85   Pulse 83   Temp (!) 96.9 F (36.1 C) (Oral)   Ht 5' (1.524 m)   Wt 104 lb 9.6 oz (47.4 kg)   BMI 20.43 kg/m    Subjective:    Patient ID: Ruth Terry, female    DOB: 09/24/1955, 62 y.o.   MRN: [id number]  HPI: Ruth Terry is a 62 y.o. female presenting on 03/15/2017 for ER follow up; Diarrhea; and Ear Pain (right )  This patient comes in for periodic recheck on medications and conditions including ear pain. States the pain is mainly in the right ear canal and posterior to ear.  Denies fever, chills. She did have a viral illness recently.  ED notes are reviewed.   All medications are reviewed today. There are no reports of any problems with the medications. All of the medical conditions are reviewed and updated.  Lab work is reviewed and will be ordered as medically necessary. There are no new problems reported with today's visit.   Past Medical History:  Diagnosis Date  . Anal fissure   . Chronic constipation   . Diverticulosis 04-20-10   colonoscopy  . Gastroparesis   . GERD (gastroesophageal reflux disease)   . Hemorrhoids   . Hyperlipidemia   . IBS (irritable bowel syndrome)   . MVP (mitral valve prolapse)   . Polyp, stomach 04-20-10   egd  . Shingles   . UTI (lower urinary tract infection)    Relevant past medical, surgical, family and social history reviewed and updated as indicated. Interim medical history since our last visit reviewed. Allergies and medications reviewed and updated. DATA REVIEWED: CHART IN EPIC  Family History reviewed for pertinent findings.  Review of Systems  Constitutional: Negative.   HENT: Positive for congestion and ear pain. Negative for facial swelling and sore throat.   Eyes: Negative.   Respiratory: Negative.  Negative for cough.   Cardiovascular: Negative for chest pain.  Gastrointestinal: Negative.   Genitourinary: Negative.     Allergies as of 03/15/2017      Reactions   Chlordiazepoxide-clidinium Itching     Ciprofloxacin Itching   Irritates stomach   Flagyl [metronidazole]    Severe yeast infection   Sulfa Drugs Cross Reactors Nausea Only   Nitrofurantoin Monohyd Macro Rash      Medication List        Accurate as of 03/15/17 11:59 PM. Always use your most recent med list.          ALIGN 4 MG Caps Take 1 capsule by mouth daily.   AMBULATORY NON FORMULARY MEDICATION Medication Name: Domperidone 10 mg Take 1 tablet at bedtime daily.   ciprofloxacin-dexamethasone OTIC suspension Commonly known as:  CIPRODEX Place 4 drops into the right ear 2 (two) times daily.   dexlansoprazole 60 MG capsule Commonly known as:  DEXILANT Take 1 capsule (60 mg total) by mouth daily.   dicyclomine 10 MG capsule Commonly known as:  BENTYL Take 1 capsule (10 mg total) by mouth every 4 (four) hours as needed for spasms.   fluticasone 50 MCG/ACT nasal spray Commonly known as:  FLONASE Place 1 spray into both nostrils daily as needed for allergies or rhinitis.   linaclotide 145 MCG Caps capsule Commonly known as:  LINZESS Take 1 capsule (145 mcg total) by mouth daily before breakfast.   polyethylene glycol powder powder Commonly known as:  GLYCOLAX/MIRALAX Take 17 g by mouth as needed for mild constipation.   psyllium 58.6 %  packet Commonly known as:  METAMUCIL Take 1 packet by mouth daily.   Simethicone 250 MG Caps Commonly known as:  PHAZYME MAXIMUM STRENGTH Take 250 mg by mouth 3 (three) times daily.   URIBEL 118 MG Caps Take 1 capsule (118 mg total) by mouth 4 (four) times daily.   valACYclovir 1000 MG tablet Commonly known as:  VALTREX Take 1 tablet (1,000 mg total) by mouth at bedtime.          Objective:    BP 135/85   Pulse 83   Temp (!) 96.9 F (36.1 C) (Oral)   Ht 5' (1.524 m)   Wt 104 lb 9.6 oz (47.4 kg)   BMI 20.43 kg/m   Allergies  Allergen Reactions  . Chlordiazepoxide-Clidinium Itching  . Ciprofloxacin Itching    Irritates stomach  . Flagyl  [Metronidazole]     Severe yeast infection  . Sulfa Drugs Cross Reactors Nausea Only  . Nitrofurantoin Monohyd Macro Rash    Wt Readings from Last 3 Encounters:  03/15/17 104 lb 9.6 oz (47.4 kg)  03/13/17 107 lb (48.5 kg)  03/10/17 106 lb 9.6 oz (48.4 kg)    Physical Exam  Constitutional: She is oriented to person, place, and time. She appears well-developed and well-nourished.  HENT:  Head: Normocephalic and atraumatic.  Right Ear: There is swelling and tenderness. A middle ear effusion is present.  Left Ear: A middle ear effusion is present.  Ears:  Nose: Mucosal edema present. Right sinus exhibits no frontal sinus tenderness. Left sinus exhibits no frontal sinus tenderness.  Mouth/Throat: Posterior oropharyngeal erythema present. No oropharyngeal exudate or tonsillar abscesses.  Eyes: Conjunctivae and EOM are normal. Pupils are equal, round, and reactive to light.  Neck: Normal range of motion.  Cardiovascular: Normal rate, regular rhythm, normal heart sounds and intact distal pulses.  Pulmonary/Chest: Effort normal and breath sounds normal.  Abdominal: Soft. Bowel sounds are normal.  Neurological: She is alert and oriented to person, place, and time. She has normal reflexes.  Skin: Skin is warm and dry. No rash noted.  Psychiatric: She has a normal mood and affect. Her behavior is normal. Judgment and thought content normal.  Nursing note and vitals reviewed.   Results for orders placed or performed during the hospital encounter of 03/13/17  C difficile quick scan w PCR reflex  Result Value Ref Range   C Diff antigen NEGATIVE NEGATIVE   C Diff toxin NEGATIVE NEGATIVE   C Diff interpretation No C. difficile detected.   Comprehensive metabolic panel  Result Value Ref Range   Sodium 139 135 - 145 mmol/L   Potassium 3.2 (L) 3.5 - 5.1 mmol/L   Chloride 104 101 - 111 mmol/L   CO2 26 22 - 32 mmol/L   Glucose, Bld 114 (H) 65 - 99 mg/dL   BUN 5 (L) 6 - 20 mg/dL   Creatinine,  Ser 0.27 0.44 - 1.00 mg/dL   Calcium 8.8 (L) 8.9 - 10.3 mg/dL   Total Protein 6.4 (L) 6.5 - 8.1 g/dL   Albumin 3.2 (L) 3.5 - 5.0 g/dL   AST 14 (L) 15 - 41 U/L   ALT 14 14 - 54 U/L   Alkaline Phosphatase 83 38 - 126 U/L   Total Bilirubin 0.5 0.3 - 1.2 mg/dL   GFR calc non Af Amer >60 >60 mL/min   GFR calc Af Amer >60 >60 mL/min   Anion gap 9 5 - 15  CBC with Differential/Platelet  Result Value Ref Range  WBC 5.6 4.0 - 10.5 K/uL   RBC 4.62 3.87 - 5.11 MIL/uL   Hemoglobin 13.8 12.0 - 15.0 g/dL   HCT 40.3 47.4 - 25.9 %   MCV 89.8 78.0 - 100.0 fL   MCH 29.9 26.0 - 34.0 pg   MCHC 33.3 30.0 - 36.0 g/dL   RDW 56.3 87.5 - 64.3 %   Platelets 191 150 - 400 K/uL   Neutrophils Relative % 70 %   Neutro Abs 4.0 1.7 - 7.7 K/uL   Lymphocytes Relative 23 %   Lymphs Abs 1.3 0.7 - 4.0 K/uL   Monocytes Relative 5 %   Monocytes Absolute 0.3 0.1 - 1.0 K/uL   Eosinophils Relative 2 %   Eosinophils Absolute 0.1 0.0 - 0.7 K/uL   Basophils Relative 0 %   Basophils Absolute 0.0 0.0 - 0.1 K/uL      Assessment & Plan:   1. Other infective acute otitis externa of right ear - ciprofloxacin-dexamethasone (CIPRODEX) OTIC suspension; Place 4 drops into the right ear 2 (two) times daily.  Dispense: 7.5 mL; Refill: 0   Continue all other maintenance medications as listed above.  Follow up plan: No Follow-up on file.  Educational handout given for survey  Remus Loffler PA-C [facility] [address]  [place], [place] [address] [phone]   03/18/2017, 8:48 AM

## 2017-03-21 ENCOUNTER — Ambulatory Visit: Payer: BLUE CROSS/BLUE SHIELD | Admitting: Physician Assistant

## 2017-03-29 ENCOUNTER — Ambulatory Visit (INDEPENDENT_AMBULATORY_CARE_PROVIDER_SITE_OTHER): Payer: BLUE CROSS/BLUE SHIELD | Admitting: Internal Medicine

## 2017-03-29 ENCOUNTER — Encounter: Payer: Self-pay | Admitting: Internal Medicine

## 2017-03-29 VITALS — BP 122/70 | HR 76 | Ht 60.0 in | Wt 106.0 lb

## 2017-03-29 DIAGNOSIS — K2980 Duodenitis without bleeding: Secondary | ICD-10-CM

## 2017-03-29 DIAGNOSIS — K219 Gastro-esophageal reflux disease without esophagitis: Secondary | ICD-10-CM

## 2017-03-29 DIAGNOSIS — K589 Irritable bowel syndrome without diarrhea: Secondary | ICD-10-CM | POA: Diagnosis not present

## 2017-03-29 DIAGNOSIS — R935 Abnormal findings on diagnostic imaging of other abdominal regions, including retroperitoneum: Secondary | ICD-10-CM | POA: Diagnosis not present
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
## 2017-03-29 NOTE — Patient Instructions (Signed)
Please follow up as needed 

## 2017-05-12 ENCOUNTER — Telehealth: Payer: Self-pay | Admitting: Internal Medicine

## 2017-05-24 ENCOUNTER — Encounter: Payer: Self-pay | Admitting: Physician Assistant

## 2017-05-24 ENCOUNTER — Ambulatory Visit: Payer: BLUE CROSS/BLUE SHIELD | Admitting: Physician Assistant

## 2017-05-24 VITALS — BP 123/76 | HR 93 | Ht 60.0 in | Wt 104.8 lb

## 2017-05-24 DIAGNOSIS — G8929 Other chronic pain: Secondary | ICD-10-CM | POA: Diagnosis not present

## 2017-05-24 DIAGNOSIS — M25562 Pain in left knee: Secondary | ICD-10-CM | POA: Diagnosis not present

## 2017-05-24 DIAGNOSIS — M25551 Pain in right hip: Secondary | ICD-10-CM

## 2017-05-24 DIAGNOSIS — W57XXXA Bitten or stung by nonvenomous insect and other nonvenomous arthropods, initial encounter: Secondary | ICD-10-CM | POA: Diagnosis not present

## 2017-05-24 DIAGNOSIS — M25561 Pain in right knee: Secondary | ICD-10-CM | POA: Diagnosis not present

## 2017-05-24 DIAGNOSIS — M2550 Pain in unspecified joint: Secondary | ICD-10-CM | POA: Diagnosis not present

## 2017-05-24 DIAGNOSIS — M25552 Pain in left hip: Secondary | ICD-10-CM | POA: Diagnosis not present

## 2017-05-24 MED ORDER — AMITRIPTYLINE HCL 25 MG PO TABS: 25.0000 mg | ORAL_TABLET | Freq: Every day | ORAL | 5 refills | Status: DC

## 2017-05-24 NOTE — Telephone Encounter (Signed)
Lm on vm 

## 2017-05-25 ENCOUNTER — Other Ambulatory Visit: Payer: Self-pay | Admitting: Physician Assistant

## 2017-05-25 DIAGNOSIS — M25562 Pain in left knee: Principal | ICD-10-CM

## 2017-05-25 DIAGNOSIS — M2550 Pain in unspecified joint: Secondary | ICD-10-CM

## 2017-05-25 DIAGNOSIS — G8929 Other chronic pain: Secondary | ICD-10-CM

## 2017-05-25 DIAGNOSIS — M25551 Pain in right hip: Secondary | ICD-10-CM

## 2017-05-25 DIAGNOSIS — M25552 Pain in left hip: Secondary | ICD-10-CM

## 2017-05-25 DIAGNOSIS — M25561 Pain in right knee: Principal | ICD-10-CM

## 2017-05-25 LAB — CMP14+EGFR
A/G RATIO: 1.9 (ref 1.2–2.2)
ALBUMIN: 4.5 g/dL (ref 3.5–5.5)
ALK PHOS: 83 IU/L (ref 39–117)
ALT: 13 IU/L (ref 0–32)
AST: 13 IU/L (ref 0–40)
BILIRUBIN TOTAL: 0.2 mg/dL (ref 0.0–1.2)
BUN/Creatinine Ratio: 10 (ref 9–23)
BUN: 7 mg/dL (ref 6–24)
CHLORIDE: 107 mmol/L — AB (ref 96–106)
CO2: 23 mmol/L (ref 20–29)
Calcium: 10 mg/dL (ref 8.7–10.2)
Creatinine, Ser: 0.69 mg/dL (ref 0.57–1.00)
GFR calc Af Amer: 110 mL/min/{1.73_m2} (ref >59)
GFR calc non Af Amer: 96 mL/min/{1.73_m2} (ref >59)
GLUCOSE: 83 mg/dL (ref 65–99)
Globulin, Total: 2.4 g/dL (ref 1.5–4.5)
POTASSIUM: 4.4 mmol/L (ref 3.5–5.2)
SODIUM: 147 mmol/L — AB (ref 134–144)
TOTAL PROTEIN: 6.9 g/dL (ref 6.0–8.5)

## 2017-05-25 LAB — LUPUS ANTICOAGULANT PANEL
Dilute Viper Venom Time: 31.4 s (ref 0.0–47.0)
PTT LA: 27.8 s (ref 0.0–51.9)

## 2017-05-25 NOTE — Progress Notes (Signed)
BP 123/76   Pulse 93   Ht 5' (1.524 m)   Wt 104 lb 12.8 oz (47.5 kg)   BMI 20.47 kg/m    Subjective:    Patient ID: Ruth Terry, female    DOB: 05/06/1955, 62 y.o.   MRN: [id number]  HPI: Ruth Terry is a 62 y.o. female presenting on 05/24/2017 for Allergies; all over joint pain; and Hyperlipidemia  This patient comes in for allergies and joint pain.  She states that she has had increasing pain over the past few months.  It can be in her hands, fingers, arms, upper back, hips, knees, ankles.  Will be quite severe at times.  She did have a blood test in the past 6 months that showed negative for rheumatoid arthritis and uric acid.  She has not been tested for lupus or any tickborne diseases.  She states that nothing she does can give it relief.  It hurts when she is still and it hurts when she moves.  She was unable to take gabapentin due to feeling quite dizzy and out of it.  So she stopped the medication.  We have had a discussion about Elavil as possible to reduce some of the pain that she experiences.  We will start with a low dose at bedtime.  Her allergies have been severe this spring.  She is having right ear congestion the most.  She denies any fever or chills.  Past Medical History:  Diagnosis Date  . Anal fissure   . Chronic constipation   . Diverticulosis 04-20-10   colonoscopy  . Gastroparesis   . GERD (gastroesophageal reflux disease)   . Hemorrhoids   . Hyperlipidemia   . IBS (irritable bowel syndrome)   . MVP (mitral valve prolapse)   . Polyp, stomach 04-20-10   egd  . Shingles   . UTI (lower urinary tract infection)    Relevant past medical, surgical, family and social history reviewed and updated as indicated. Interim medical history since our last visit reviewed. Allergies and medications reviewed and updated. DATA REVIEWED: CHART IN EPIC  Family History reviewed for pertinent findings.  Review of Systems  Constitutional: Negative.  Negative for  activity change, fatigue and fever.  HENT: Negative.   Eyes: Negative.   Respiratory: Negative.  Negative for cough.   Cardiovascular: Negative.  Negative for chest pain.  Gastrointestinal: Negative.  Negative for abdominal pain.  Endocrine: Negative.   Genitourinary: Negative.  Negative for dysuria.  Musculoskeletal: Positive for arthralgias, joint swelling and myalgias.  Skin: Negative.   Neurological: Negative.     Allergies as of 05/24/2017      Reactions   Chlordiazepoxide-clidinium Itching   Ciprofloxacin Itching   Irritates stomach   Flagyl [metronidazole]    Severe yeast infection   Sulfa Drugs Cross Reactors Nausea Only   Nitrofurantoin Monohyd Macro Rash      Medication List        Accurate as of 05/24/17 11:59 PM. Always use your most recent med list.          ALIGN 4 MG Caps Take 1 capsule by mouth daily.   AMBULATORY NON FORMULARY MEDICATION Medication Name: Domperidone 10 mg Take 1 tablet at bedtime daily.   amitriptyline 25 MG tablet Commonly known as:  ELAVIL Take 1-2 tablets (25-50 mg total) by mouth at bedtime.   dexlansoprazole 60 MG capsule Commonly known as:  DEXILANT Take 1 capsule (60 mg total) by mouth daily.  dicyclomine 10 MG capsule Commonly known as:  BENTYL Take 1 capsule (10 mg total) by mouth every 4 (four) hours as needed for spasms.   linaclotide 145 MCG Caps capsule Commonly known as:  LINZESS Take 1 capsule (145 mcg total) by mouth daily before breakfast.   polyethylene glycol powder powder Commonly known as:  GLYCOLAX/MIRALAX Take 17 g by mouth as needed for mild constipation.   psyllium 58.6 % packet Commonly known as:  METAMUCIL Take 1 packet by mouth daily.   Simethicone 250 MG Caps Commonly known as:  PHAZYME MAXIMUM STRENGTH Take 250 mg by mouth 3 (three) times daily.   URIBEL 118 MG Caps Take 1 capsule (118 mg total) by mouth 4 (four) times daily.   valACYclovir 1000 MG tablet Commonly known as:   VALTREX Take 1 tablet (1,000 mg total) by mouth at bedtime.          Objective:    BP 123/76   Pulse 93   Ht 5' (1.524 m)   Wt 104 lb 12.8 oz (47.5 kg)   BMI 20.47 kg/m   Allergies  Allergen Reactions  . Chlordiazepoxide-Clidinium Itching  . Ciprofloxacin Itching    Irritates stomach  . Flagyl [Metronidazole]     Severe yeast infection  . Sulfa Drugs Cross Reactors Nausea Only  . Nitrofurantoin Monohyd Macro Rash    Wt Readings from Last 3 Encounters:  05/24/17 104 lb 12.8 oz (47.5 kg)  03/29/17 106 lb (48.1 kg)  03/15/17 104 lb 9.6 oz (47.4 kg)    Physical Exam  Constitutional: She is oriented to person, place, and time. She appears well-developed and well-nourished.  HENT:  Head: Normocephalic and atraumatic.  Right Ear: Tympanic membrane, external ear and ear canal normal.  Left Ear: Tympanic membrane, external ear and ear canal normal.  Nose: Nose normal. No rhinorrhea.  Mouth/Throat: Oropharynx is clear and moist and mucous membranes are normal. No oropharyngeal exudate or posterior oropharyngeal erythema.  Eyes: Pupils are equal, round, and reactive to light. Conjunctivae and EOM are normal.  Neck: Normal range of motion. Neck supple.  Cardiovascular: Normal rate, regular rhythm, normal heart sounds and intact distal pulses.  Pulmonary/Chest: Effort normal and breath sounds normal.  Abdominal: Soft. Bowel sounds are normal.  Neurological: She is alert and oriented to person, place, and time. She has normal reflexes.  Skin: Skin is warm and dry. No rash noted.  Psychiatric: She has a normal mood and affect. Her behavior is normal. Judgment and thought content normal.    Results for orders placed or performed in visit on 05/24/17  Lupus anticoagulant panel  Result Value Ref Range   PTT Lupus Anticoagulant WILL FOLLOW    Dilute Viper Venom Time WILL FOLLOW    Lupus Anticoag Interp WILL FOLLOW   CMP14+EGFR  Result Value Ref Range   Glucose 83 65 - 99 mg/dL    BUN 7 6 - 24 mg/dL   Creatinine, Ser 1.02 0.57 - 1.00 mg/dL   GFR calc non Af Amer 96 >59 mL/min/1.73   GFR calc Af Amer 110 >59 mL/min/1.73   BUN/Creatinine Ratio 10 9 - 23   Sodium 147 (H) 134 - 144 mmol/L   Potassium 4.4 3.5 - 5.2 mmol/L   Chloride 107 (H) 96 - 106 mmol/L   CO2 23 20 - 29 mmol/L   Calcium 10.0 8.7 - 10.2 mg/dL   Total Protein 6.9 6.0 - 8.5 g/dL   Albumin 4.5 3.5 - 5.5 g/dL   Globulin,  Total 2.4 1.5 - 4.5 g/dL   Albumin/Globulin Ratio 1.9 1.2 - 2.2   Bilirubin Total 0.2 0.0 - 1.2 mg/dL   Alkaline Phosphatase 83 39 - 117 IU/L   AST 13 0 - 40 IU/L   ALT 13 0 - 32 IU/L      Assessment & Plan:   1. Arthralgia, unspecified joint - Lupus anticoagulant panel - CMP14+EGFR - MM Digital Diagnostic Bilat; Future  2. Pain of both hip joints - Lupus anticoagulant panel - CMP14+EGFR - MM Digital Diagnostic Bilat; Future  3. Chronic pain of both knees Anti-inflammatory Ice Rest  4. Tick bite, initial encounter - Lyme Ab/Western Blot Reflex - Rocky mtn spotted fvr abs pnl(IgG+IgM)   Continue all other maintenance medications as listed above.  Follow up plan: No follow-ups on file.  Educational handout given for survey  Remus Loffler PA-C [facility] [address]  [place], [place] [address] [phone]   05/25/2017, 11:19 AM

## 2017-05-26 ENCOUNTER — Other Ambulatory Visit: Payer: Self-pay | Admitting: Physician Assistant

## 2017-05-26 DIAGNOSIS — M2550 Pain in unspecified joint: Secondary | ICD-10-CM

## 2017-05-26 DIAGNOSIS — M25562 Pain in left knee: Secondary | ICD-10-CM

## 2017-05-26 DIAGNOSIS — G8929 Other chronic pain: Secondary | ICD-10-CM

## 2017-05-26 DIAGNOSIS — M25551 Pain in right hip: Secondary | ICD-10-CM

## 2017-05-26 DIAGNOSIS — M25552 Pain in left hip: Secondary | ICD-10-CM

## 2017-05-26 DIAGNOSIS — M25561 Pain in right knee: Secondary | ICD-10-CM

## 2017-05-26 LAB — ROCKY MTN SPOTTED FVR ABS PNL(IGG+IGM)
RMSF IgG: NEGATIVE
RMSF IgM: 0.71 index (ref 0.00–0.89)

## 2017-05-26 LAB — LYME AB/WESTERN BLOT REFLEX: LYME DISEASE AB, QUANT, IGM: <0.8 index (ref 0.00–0.79)

## 2017-05-26 NOTE — Progress Notes (Signed)
aniti

## 2017-06-02 DIAGNOSIS — M2042 Other hammer toe(s) (acquired), left foot: Secondary | ICD-10-CM | POA: Diagnosis not present

## 2017-06-02 DIAGNOSIS — S93332D Other subluxation of left foot, subsequent encounter: Secondary | ICD-10-CM | POA: Diagnosis not present

## 2017-06-02 DIAGNOSIS — S93331D Other subluxation of right foot, subsequent encounter: Secondary | ICD-10-CM | POA: Diagnosis not present

## 2017-06-02 DIAGNOSIS — M2041 Other hammer toe(s) (acquired), right foot: Secondary | ICD-10-CM | POA: Diagnosis not present

## 2017-06-09 ENCOUNTER — Ambulatory Visit (HOSPITAL_COMMUNITY)
Admission: RE | Admit: 2017-06-09 | Discharge: 2017-06-09 | Disposition: A | Discharge status: Home / Self Care | Payer: BLUE CROSS/BLUE SHIELD | Source: Ambulatory Visit | Attending: Physician Assistant | Admitting: Physician Assistant

## 2017-06-09 ENCOUNTER — Encounter (HOSPITAL_COMMUNITY): Payer: Self-pay

## 2017-06-09 ENCOUNTER — Other Ambulatory Visit: Payer: Self-pay | Admitting: Family Medicine

## 2017-06-09 ENCOUNTER — Other Ambulatory Visit: Payer: BLUE CROSS/BLUE SHIELD

## 2017-06-09 DIAGNOSIS — M25562 Pain in left knee: Secondary | ICD-10-CM

## 2017-06-09 DIAGNOSIS — M25551 Pain in right hip: Secondary | ICD-10-CM | POA: Insufficient documentation

## 2017-06-09 DIAGNOSIS — Z1231 Encounter for screening mammogram for malignant neoplasm of breast: Secondary | ICD-10-CM | POA: Diagnosis not present

## 2017-06-09 DIAGNOSIS — M2550 Pain in unspecified joint: Secondary | ICD-10-CM

## 2017-06-09 DIAGNOSIS — M25561 Pain in right knee: Secondary | ICD-10-CM

## 2017-06-09 DIAGNOSIS — M25552 Pain in left hip: Secondary | ICD-10-CM | POA: Diagnosis not present

## 2017-06-09 DIAGNOSIS — G8929 Other chronic pain: Secondary | ICD-10-CM

## 2017-06-09 NOTE — Progress Notes (Signed)
f °

## 2017-06-11 LAB — ANTINUCLEAR ANTIBODIES, IFA: ANA Titer 1: NEGATIVE

## 2017-06-11 LAB — SYSTEMIC LUPUS PROFILE A
Chromatin Ab SerPl-aCnc: <0.2 AI (ref 0.0–0.9)
ENA SM Ab Ser-aCnc: <0.2 AI (ref 0.0–0.9)
ENA SSB (LA) Ab: <0.2 AI (ref 0.0–0.9)
Rhuematoid fact SerPl-aCnc: <10 IU/mL (ref 0.0–13.9)

## 2017-06-17 ENCOUNTER — Other Ambulatory Visit: Payer: Self-pay | Admitting: Physician Assistant

## 2017-07-23 DIAGNOSIS — H40033 Anatomical narrow angle, bilateral: Secondary | ICD-10-CM | POA: Diagnosis not present

## 2017-07-23 DIAGNOSIS — H10013 Acute follicular conjunctivitis, bilateral: Secondary | ICD-10-CM | POA: Diagnosis not present

## 2017-07-25 DIAGNOSIS — H04123 Dry eye syndrome of bilateral lacrimal glands: Secondary | ICD-10-CM | POA: Diagnosis not present

## 2017-07-27 ENCOUNTER — Ambulatory Visit: Payer: BLUE CROSS/BLUE SHIELD | Admitting: Physician Assistant

## 2017-07-27 ENCOUNTER — Encounter: Payer: Self-pay | Admitting: Physician Assistant

## 2017-07-27 VITALS — BP 139/80 | HR 75 | Temp 97.4°F | Ht 60.0 in | Wt 109.4 lb

## 2017-07-27 DIAGNOSIS — K644 Residual hemorrhoidal skin tags: Secondary | ICD-10-CM | POA: Diagnosis not present

## 2017-07-27 DIAGNOSIS — K648 Other hemorrhoids: Secondary | ICD-10-CM

## 2017-07-27 MED ORDER — HYDROCORTISONE ACE-PRAMOXINE 1-1 % RE FOAM: 1.0000 | Freq: Two times a day (BID) | RECTAL | 2 refills | Status: DC

## 2017-07-27 NOTE — Progress Notes (Signed)
BP 139/80   Pulse 75   Temp (!) 97.4 F (36.3 C) (Oral)   Ht 5' (1.524 m)   Wt 109 lb 6.4 oz (49.6 kg)   BMI 21.37 kg/m    Subjective:    Patient ID: Ruth Terry, female    DOB: 10/08/1955, 62 y.o.   MRN: [id number]  HPI: Ruth Terry is a 62 y.o. female presenting on 07/27/2017 for Hemorrhoids This patient has long-standing history of hemorrhoids.  She has severe irritable bowel syndrome but will go back and forth between diarrhea and constipation.  She has recently had a significant amount of constipation.  She remembers just a couple of days ago having to strain significantly to go to the bathroom.  She has had some hemorrhoids that are at the opening.  She denies any gross blood at this time.    Past Medical History:  Diagnosis Date  . Anal fissure   . Chronic constipation   . Diverticulosis 04-20-10   colonoscopy  . Gastroparesis   . GERD (gastroesophageal reflux disease)   . Hemorrhoids   . Hyperlipidemia   . IBS (irritable bowel syndrome)   . MVP (mitral valve prolapse)   . Polyp, stomach 04-20-10   egd  . Shingles   . UTI (lower urinary tract infection)    Relevant past medical, surgical, family and social history reviewed and updated as indicated. Interim medical history since our last visit reviewed. Allergies and medications reviewed and updated. DATA REVIEWED: CHART IN EPIC  Family History reviewed for pertinent findings.  Review of Systems  Constitutional: Negative.   HENT: Negative.   Eyes: Negative.   Respiratory: Negative.   Gastrointestinal: Positive for constipation, diarrhea and rectal pain.  Genitourinary: Negative.     Allergies as of 07/27/2017      Reactions   Chlordiazepoxide-clidinium Itching   Ciprofloxacin Itching   Irritates stomach   Flagyl [metronidazole]    Severe yeast infection   Sulfa Drugs Cross Reactors Nausea Only   Nitrofurantoin Monohyd Macro Rash      Medication List        Accurate as of 07/27/17  3:01 PM.  Always use your most recent med list.          ALIGN 4 MG Caps Take 1 capsule by mouth daily.   AMBULATORY NON FORMULARY MEDICATION Medication Name: Domperidone 10 mg Take 1 tablet at bedtime daily.   amitriptyline 25 MG tablet Commonly known as:  ELAVIL Take 1-2 tablets (25-50 mg total) by mouth at bedtime.   dexlansoprazole 60 MG capsule Commonly known as:  DEXILANT Take 1 capsule (60 mg total) by mouth daily.   dicyclomine 10 MG capsule Commonly known as:  BENTYL Take 1 capsule (10 mg total) by mouth every 4 (four) hours as needed for spasms.   hydrocortisone-pramoxine rectal foam Commonly known as:  PROCTOFOAM-HC Place 1 applicator rectally 2 (two) times daily.   LINZESS 145 MCG Caps capsule Generic drug:  linaclotide TAKE 1 CAPSULE BY MOUTH ONCE DAILY BEFORE BREAKFAST   polyethylene glycol powder powder Commonly known as:  GLYCOLAX/MIRALAX Take 17 g by mouth as needed for mild constipation.   psyllium 58.6 % packet Commonly known as:  METAMUCIL Take 1 packet by mouth daily.   Simethicone 250 MG Caps Commonly known as:  PHAZYME MAXIMUM STRENGTH Take 250 mg by mouth 3 (three) times daily.   URIBEL 118 MG Caps Take 1 capsule (118 mg total) by mouth 4 (four) times daily.  valACYclovir 1000 MG tablet Commonly known as:  VALTREX Take 1 tablet (1,000 mg total) by mouth at bedtime.          Objective:    BP 139/80   Pulse 75   Temp (!) 97.4 F (36.3 C) (Oral)   Ht 5' (1.524 m)   Wt 109 lb 6.4 oz (49.6 kg)   BMI 21.37 kg/m   Allergies  Allergen Reactions  . Chlordiazepoxide-Clidinium Itching  . Ciprofloxacin Itching    Irritates stomach  . Flagyl [Metronidazole]     Severe yeast infection  . Sulfa Drugs Cross Reactors Nausea Only  . Nitrofurantoin Monohyd Macro Rash    Wt Readings from Last 3 Encounters:  07/27/17 109 lb 6.4 oz (49.6 kg)  05/24/17 104 lb 12.8 oz (47.5 kg)  03/29/17 106 lb (48.1 kg)    Physical Exam  Constitutional: She is  oriented to person, place, and time. She appears well-developed and well-nourished.  HENT:  Head: Normocephalic and atraumatic.  Eyes: Pupils are equal, round, and reactive to light. Conjunctivae and EOM are normal.  Cardiovascular: Normal rate, regular rhythm, normal heart sounds and intact distal pulses.  Pulmonary/Chest: Effort normal and breath sounds normal.  Abdominal: Soft. Bowel sounds are normal. She exhibits no mass. There is tenderness. There is rebound. No hernia.  Neurological: She is alert and oriented to person, place, and time. She has normal reflexes.  Skin: Skin is warm and dry. No rash noted.  Psychiatric: She has a normal mood and affect. Her behavior is normal. Judgment and thought content normal.        Assessment & Plan:   1. Internal hemorrhoids - hydrocortisone-pramoxine (PROCTOFOAM-HC) rectal foam; Place 1 applicator rectally 2 (two) times daily.  Dispense: 10 g; Refill: 2  2. External hemorrhoid - hydrocortisone-pramoxine (PROCTOFOAM-HC) rectal foam; Place 1 applicator rectally 2 (two) times daily.  Dispense: 10 g; Refill: 2   Continue all other maintenance medications as listed above.  Follow up plan: No follow-ups on file.  Educational handout given for survey  Remus Loffler PA-C [facility] [address]  [place], [place] [address] [phone]   07/27/2017, 3:01 PM

## 2017-07-28 ENCOUNTER — Telehealth: Payer: Self-pay | Admitting: Physician Assistant

## 2017-07-28 ENCOUNTER — Other Ambulatory Visit: Payer: Self-pay | Admitting: Physician Assistant

## 2017-07-28 MED ORDER — HYDROCORTISONE 2.5 % RE CREA: 1.0000 "application " | TOPICAL_CREAM | Freq: Two times a day (BID) | RECTAL | 0 refills | Status: DC

## 2017-07-28 NOTE — Telephone Encounter (Signed)
Patient aware.

## 2017-07-28 NOTE — Telephone Encounter (Signed)
Sent suppository that should be approved.  There are very few meds for this

## 2017-07-29 ENCOUNTER — Telehealth: Payer: Self-pay | Admitting: Physician Assistant

## 2017-07-29 NOTE — Telephone Encounter (Signed)
Spoke with patient.

## 2017-08-02 ENCOUNTER — Telehealth: Payer: Self-pay

## 2017-08-02 ENCOUNTER — Other Ambulatory Visit: Payer: Self-pay | Admitting: Physician Assistant

## 2017-08-02 MED ORDER — HYDROCORTISONE 2.5 % RE CREA: 1.0000 "application " | TOPICAL_CREAM | Freq: Two times a day (BID) | RECTAL | 0 refills | Status: DC

## 2017-08-02 NOTE — Telephone Encounter (Signed)
Sent medication

## 2017-08-02 NOTE — Telephone Encounter (Signed)
Insurance denied Proctofoam  Formulary are hydrocortisone rectal cream 1% and Anusol HC cream

## 2017-08-02 NOTE — Progress Notes (Signed)
anusol HC cream

## 2017-08-03 ENCOUNTER — Ambulatory Visit: Payer: BLUE CROSS/BLUE SHIELD | Admitting: Physician Assistant

## 2017-08-03 ENCOUNTER — Encounter: Payer: Self-pay | Admitting: Physician Assistant

## 2017-08-03 VITALS — BP 118/73 | HR 98 | Temp 98.3°F | Ht 60.0 in | Wt 110.8 lb

## 2017-08-03 DIAGNOSIS — L290 Pruritus ani: Secondary | ICD-10-CM

## 2017-08-03 DIAGNOSIS — K648 Other hemorrhoids: Secondary | ICD-10-CM

## 2017-08-04 DIAGNOSIS — H10013 Acute follicular conjunctivitis, bilateral: Secondary | ICD-10-CM | POA: Diagnosis not present

## 2017-08-07 NOTE — Progress Notes (Signed)
BP 118/73   Pulse 98   Temp 98.3 F (36.8 C) (Oral)   Ht 5' (1.524 m)   Wt 110 lb 12.8 oz (50.3 kg)   BMI 21.64 kg/m    Subjective:    Patient ID: Ruth Terry, female    DOB: [birth year], 62 y.o.   MRN: [id number]  HPI: Ruth Terry is a 62 y.o. female presenting on 08/03/2017 for Rash on gluteal and Hemorrhoids  This patient comes in for recheck on her hemorrhoids.  She denies any fever or chills.  She has had some hemorrhoids but seems to be sensitive to the suppositories that we had used.  She is tender around the anal opening and onto her buttocks.  There has been a slight bit of rash.  She states in 1 day it has improved.  She had stopped using the hemorrhoid medicine.  Past Medical History:  Diagnosis Date  . Anal fissure   . Chronic constipation   . Diverticulosis 04-20-10   colonoscopy  . Gastroparesis   . GERD (gastroesophageal reflux disease)   . Hemorrhoids   . Hyperlipidemia   . IBS (irritable bowel syndrome)   . MVP (mitral valve prolapse)   . Polyp, stomach 04-20-10   egd  . Shingles   . UTI (lower urinary tract infection)    Relevant past medical, surgical, family and social history reviewed and updated as indicated. Interim medical history since our last visit reviewed. Allergies and medications reviewed and updated. DATA REVIEWED: CHART IN EPIC  Family History reviewed for pertinent findings.  Review of Systems  Constitutional: Negative.   HENT: Negative.   Eyes: Negative.   Respiratory: Negative.   Gastrointestinal: Negative.   Genitourinary: Negative.     Allergies as of 08/03/2017      Reactions   Chlordiazepoxide-clidinium Itching   Ciprofloxacin Itching   Irritates stomach   Flagyl [metronidazole]    Severe yeast infection   Sulfa Drugs Cross Reactors Nausea Only   Nitrofurantoin Monohyd Macro Rash      Medication List        Accurate as of 08/03/17 11:59 PM. Always use your most recent med list.          ALIGN 4 MG  Caps Take 1 capsule by mouth daily.   AMBULATORY NON FORMULARY MEDICATION Medication Name: Domperidone 10 mg Take 1 tablet at bedtime daily.   amitriptyline 25 MG tablet Commonly known as:  ELAVIL Take 1-2 tablets (25-50 mg total) by mouth at bedtime.   dexlansoprazole 60 MG capsule Commonly known as:  DEXILANT Take 1 capsule (60 mg total) by mouth daily.   dicyclomine 10 MG capsule Commonly known as:  BENTYL Take 1 capsule (10 mg total) by mouth every 4 (four) hours as needed for spasms.   hydrocortisone 2.5 % rectal cream Commonly known as:  ANUSOL-HC Place 1 application rectally 2 (two) times daily. Please fill as Anusol HC cream, insurance preference   hydrocortisone-pramoxine rectal foam Commonly known as:  PROCTOFOAM-HC Place 1 applicator rectally 2 (two) times daily.   LINZESS 145 MCG Caps capsule Generic drug:  linaclotide TAKE 1 CAPSULE BY MOUTH ONCE DAILY BEFORE BREAKFAST   polyethylene glycol powder powder Commonly known as:  GLYCOLAX/MIRALAX Take 17 g by mouth as needed for mild constipation.   psyllium 58.6 % packet Commonly known as:  METAMUCIL Take 1 packet by mouth daily.   Simethicone 250 MG Caps Commonly known as:  PHAZYME MAXIMUM STRENGTH Take 250 mg  by mouth 3 (three) times daily.   URIBEL 118 MG Caps Take 1 capsule (118 mg total) by mouth 4 (four) times daily.   valACYclovir 1000 MG tablet Commonly known as:  VALTREX Take 1 tablet (1,000 mg total) by mouth at bedtime.          Objective:    BP 118/73   Pulse 98   Temp 98.3 F (36.8 C) (Oral)   Ht 5' (1.524 m)   Wt 110 lb 12.8 oz (50.3 kg)   BMI 21.64 kg/m   Allergies  Allergen Reactions  . Chlordiazepoxide-Clidinium Itching  . Ciprofloxacin Itching    Irritates stomach  . Flagyl [Metronidazole]     Severe yeast infection  . Sulfa Drugs Cross Reactors Nausea Only  . Nitrofurantoin Monohyd Macro Rash    Wt Readings from Last 3 Encounters:  08/03/17 110 lb 12.8 oz (50.3 kg)   07/27/17 109 lb 6.4 oz (49.6 kg)  05/24/17 104 lb 12.8 oz (47.5 kg)    Physical Exam  Constitutional: She is oriented to person, place, and time. She appears well-developed and well-nourished.  HENT:  Head: Normocephalic and atraumatic.  Eyes: Pupils are equal, round, and reactive to light. Conjunctivae and EOM are normal.  Cardiovascular: Normal rate, regular rhythm, normal heart sounds and intact distal pulses.  Pulmonary/Chest: Effort normal and breath sounds normal.  Abdominal: Soft. Bowel sounds are normal.  Genitourinary:     Genitourinary Comments: Red skin on the perianal skin, no lesions  Neurological: She is alert and oriented to person, place, and time. She has normal reflexes.  Skin: Skin is warm and dry. No rash noted.  Psychiatric: She has a normal mood and affect. Her behavior is normal. Judgment and thought content normal.        Assessment & Plan:   1. Internal hemorrhoids Stop meds, use ice  2. Anal itching Stop meds, use ice   Continue all other maintenance medications as listed above.  Follow up plan: No follow-ups on file.  Educational handout given for survey  Remus Loffler PA-C [facility] [address]  [place], [place] [address] [phone]   08/07/2017, 2:14 PM

## 2017-08-10 ENCOUNTER — Telehealth: Payer: Self-pay | Admitting: Internal Medicine

## 2017-08-10 NOTE — Telephone Encounter (Signed)
Spoke with pt and she is aware of Dr. Celesta Aver recommendations. States she will try the OTC meds and soaks and will try to find calmol suppositories she has used in the past. States if she is not better by the first of the week she will call back for an appt.

## 2017-08-10 NOTE — Telephone Encounter (Signed)
Ruth Terry Pt states she is having a lot of pain/discomfort from her internal and external hemorrhoids. States she saw her PCP and was prescribed Proctofoam but it "blistered her bottom." Her insurance would not approve Anusol cream. Pt thinks it was the hydrocortisone in the proctofoam that broke her out. Discussed with her she should do sitz bathes and could try prep h and tucs pads and recticare otc. Pt states she feels she needs a prescription called in for the hemorrhoids. Let her know all products have hydrocortisone in them but pt wanted to see what DOD would recommend. Dr. Carlean Purl please advise.

## 2017-08-10 NOTE — Telephone Encounter (Signed)
She needs an in office evaluation before we rx anything  She was treated for hemorrhoids and now has a rash  If she wants to try something for the skin problem recommend she use a diaper rash ointment along with your recommendations You are correct that all Rxs have hydrocortisone  We can offer her an APP appointment vs Dr. Henrene Pastor but if no good slots and one of the new MD's has a slot later this month she could be seen by one of them as a work-in

## 2017-08-18 ENCOUNTER — Ambulatory Visit: Payer: BLUE CROSS/BLUE SHIELD | Admitting: Internal Medicine

## 2017-09-18 ENCOUNTER — Other Ambulatory Visit: Payer: Self-pay | Admitting: Physician Assistant

## 2017-09-19 NOTE — Telephone Encounter (Signed)
Last visit: 08/03/17

## 2017-11-14 ENCOUNTER — Telehealth: Payer: Self-pay | Admitting: Internal Medicine

## 2017-11-14 NOTE — Telephone Encounter (Signed)
Pt called requesting refill for domperidone sent to [place] [facility], phone # [phone]. Pt would like a call after prescription is sent.

## 2017-11-16 NOTE — Telephone Encounter (Signed)
I last saw this pt in Sept 2018 - not 2019 . Domperidone was not on her med list when she saw DR Henrene Pastor last  In sept 2019 . She has had an rx for Domperidone 10 mg po daily  In the past  For gastroparesis .  We need to call pt and find out how she uses med and why she hasn't been on it chronically...does she only take it periodically ?  Ok to refill x 6   Med needs  to be on her med list  !

## 2017-11-16 NOTE — Telephone Encounter (Signed)
Check with [name]

## 2017-11-16 NOTE — Telephone Encounter (Signed)
Patient requesting refill of Domperidone which we have given her in the past.  Last filled by Nicoletta Ba on 09/08/2017.  Last office visit with you 03/29/2017.  Please advise.

## 2017-11-28 MED ORDER — AMBULATORY NON FORMULARY MEDICATION: 1.0000 | Freq: Every day | 1 refills | Status: DC

## 2017-11-28 NOTE — Telephone Encounter (Signed)
Spoke with patient who clarified that she is taking the medication daily and consistently and that she can "really tell when I miss it".  Refilled for 6 months.

## 2017-11-28 NOTE — Telephone Encounter (Signed)
Patient calling back to check status of this medication. Patient a tad irritated that she has not heard back yet. Patient states she will have to be on this medication for the rest of her life and would like the prescription faxed to [phone], the [place].

## 2018-01-09 DIAGNOSIS — S93331D Other subluxation of right foot, subsequent encounter: Secondary | ICD-10-CM | POA: Diagnosis not present

## 2018-01-09 DIAGNOSIS — S93332D Other subluxation of left foot, subsequent encounter: Secondary | ICD-10-CM | POA: Diagnosis not present

## 2018-01-09 DIAGNOSIS — M2041 Other hammer toe(s) (acquired), right foot: Secondary | ICD-10-CM | POA: Diagnosis not present

## 2018-01-09 DIAGNOSIS — M2042 Other hammer toe(s) (acquired), left foot: Secondary | ICD-10-CM | POA: Diagnosis not present

## 2018-01-23 ENCOUNTER — Ambulatory Visit (INDEPENDENT_AMBULATORY_CARE_PROVIDER_SITE_OTHER): Payer: BLUE CROSS/BLUE SHIELD | Admitting: Physician Assistant

## 2018-01-23 ENCOUNTER — Encounter: Payer: Self-pay | Admitting: Physician Assistant

## 2018-01-23 VITALS — BP 119/74 | HR 105 | Temp 98.8°F | Ht 60.0 in | Wt 112.8 lb

## 2018-01-23 DIAGNOSIS — R300 Dysuria: Secondary | ICD-10-CM | POA: Diagnosis not present

## 2018-01-23 DIAGNOSIS — R509 Fever, unspecified: Secondary | ICD-10-CM

## 2018-01-23 DIAGNOSIS — J111 Influenza due to unidentified influenza virus with other respiratory manifestations: Secondary | ICD-10-CM | POA: Diagnosis not present

## 2018-01-23 LAB — URINALYSIS, COMPLETE
BILIRUBIN UA: NEGATIVE
Glucose, UA: NEGATIVE
Ketones, UA: NEGATIVE
Leukocytes, UA: NEGATIVE
Nitrite, UA: NEGATIVE
PH UA: 6 (ref 5.0–7.5)
Protein, UA: NEGATIVE
Specific Gravity, UA: 1.015 (ref 1.005–1.030)
UUROB: 0.2 mg/dL (ref 0.2–1.0)

## 2018-01-23 LAB — MICROSCOPIC EXAMINATION
Bacteria, UA: NONE SEEN
RENAL EPITHEL UA: NONE SEEN /HPF

## 2018-01-23 LAB — VERITOR FLU A/B WAIVED
Influenza A: NEGATIVE
Influenza B: NEGATIVE

## 2018-01-23 MED ORDER — OSELTAMIVIR PHOSPHATE 75 MG PO CAPS: 75.0000 mg | ORAL_CAPSULE | Freq: Two times a day (BID) | ORAL | 0 refills | Status: DC

## 2018-01-23 MED ORDER — BENZONATATE 200 MG PO CAPS: 200.0000 mg | ORAL_CAPSULE | Freq: Two times a day (BID) | ORAL | 0 refills | Status: DC | PRN

## 2018-01-24 NOTE — Progress Notes (Signed)
BP 119/74   Pulse (!) 105   Temp 98.8 F (37.1 C) (Oral)   Ht 5' (1.524 m)   Wt 112 lb 12.8 oz (51.2 kg)   BMI 22.03 kg/m    Subjective:    Patient ID: Ruth Terry, female    DOB: [birth year], 63 y.o.   MRN: [id number]  HPI: Ruth Terry is a 63 y.o. female presenting on 01/23/2018 for Cough; Ear Pain; Nasal Congestion; Fever (low grade 100.8 ); and Generalized Body Aches  This patient has had less than 2 days severe fever, chills, myalgias.  Complains of sinus headache and postnasal drainage. There is copious drainage at times. Associated sore throat, decreased appetite and headache.  Has been exposed to influenza.   This patient has had several days of dysuria, frequency and nocturia. There is also pain over the bladder in the suprapubic region, no back pain. Denies leakage or hematuria.  Denies fever or chills. No pain in flank area.   Past Medical History:  Diagnosis Date  . Anal fissure   . Chronic constipation   . Diverticulosis 04-20-10   colonoscopy  . Gastroparesis   . GERD (gastroesophageal reflux disease)   . Hemorrhoids   . Hyperlipidemia   . IBS (irritable bowel syndrome)   . MVP (mitral valve prolapse)   . Polyp, stomach 04-20-10   egd  . Shingles   . UTI (lower urinary tract infection)    Relevant past medical, surgical, family and social history reviewed and updated as indicated. Interim medical history since our last visit reviewed. Allergies and medications reviewed and updated. DATA REVIEWED: CHART IN EPIC  Family History reviewed for pertinent findings.  Review of Systems  Constitutional: Positive for appetite change, chills, fatigue and fever. Negative for activity change.  HENT: Positive for congestion, postnasal drip and sore throat.   Eyes: Negative.   Respiratory: Negative.  Negative for cough and wheezing.   Cardiovascular: Negative.  Negative for chest pain, palpitations and leg swelling.  Gastrointestinal: Negative.   Genitourinary:  Positive for dysuria and frequency. Negative for flank pain.  Musculoskeletal: Positive for myalgias.  Skin: Negative.   Neurological: Positive for headaches.    Allergies as of 01/23/2018      Reactions   Chlordiazepoxide-clidinium Itching   Ciprofloxacin Itching   Irritates stomach   Flagyl [metronidazole]    Severe yeast infection   Sulfa Drugs Cross Reactors Nausea Only   Nitrofurantoin Monohyd Macro Rash      Medication List       Accurate as of January 23, 2018 11:59 PM. Always use your most recent med list.        ALIGN 4 MG Caps Take 1 capsule by mouth daily.   AMBULATORY NON FORMULARY MEDICATION Medication Name: Domperidone 10 mg Take 1 tablet at bedtime daily.   AMBULATORY NON FORMULARY MEDICATION Take 1 tablet by mouth at bedtime. Medication Name: Domperidone 10mg    amitriptyline 25 MG tablet Commonly known as:  ELAVIL Take 1-2 tablets (25-50 mg total) by mouth at bedtime.   benzonatate 200 MG capsule Commonly known as:  TESSALON Take 1 capsule (200 mg total) by mouth 2 (two) times daily as needed for cough.   dexlansoprazole 60 MG capsule Commonly known as:  DEXILANT Take 1 capsule (60 mg total) by mouth daily.   dicyclomine 10 MG capsule Commonly known as:  BENTYL Take 1 capsule (10 mg total) by mouth every 4 (four) hours as needed for spasms.   hydrocortisone 2.5 %  rectal cream Commonly known as:  ANUSOL-HC Place 1 application rectally 2 (two) times daily. Please fill as Anusol HC cream, insurance preference   hydrocortisone-pramoxine rectal foam Commonly known as:  PROCTOFOAM-HC Place 1 applicator rectally 2 (two) times daily.   LINZESS 145 MCG Caps capsule Generic drug:  linaclotide TAKE 1 CAPSULE BY MOUTH ONCE DAILY BEFORE BREAKFAST   oseltamivir 75 MG capsule Commonly known as:  TAMIFLU Take 1 capsule (75 mg total) by mouth 2 (two) times daily.   polyethylene glycol powder powder Commonly known as:  GLYCOLAX/MIRALAX Take 17 g by  mouth as needed for mild constipation.   psyllium 58.6 % packet Commonly known as:  METAMUCIL Take 1 packet by mouth daily.   Simethicone 250 MG Caps Commonly known as:  PHAZYME MAXIMUM STRENGTH Take 250 mg by mouth 3 (three) times daily.   URIBEL 118 MG Caps Take 1 capsule (118 mg total) by mouth 4 (four) times daily.   valACYclovir 1000 MG tablet Commonly known as:  VALTREX Take 1 tablet (1,000 mg total) by mouth at bedtime.          Objective:    BP 119/74   Pulse (!) 105   Temp 98.8 F (37.1 C) (Oral)   Ht 5' (1.524 m)   Wt 112 lb 12.8 oz (51.2 kg)   BMI 22.03 kg/m   Allergies  Allergen Reactions  . Chlordiazepoxide-Clidinium Itching  . Ciprofloxacin Itching    Irritates stomach  . Flagyl [Metronidazole]     Severe yeast infection  . Sulfa Drugs Cross Reactors Nausea Only  . Nitrofurantoin Monohyd Macro Rash    Wt Readings from Last 3 Encounters:  01/23/18 112 lb 12.8 oz (51.2 kg)  08/03/17 110 lb 12.8 oz (50.3 kg)  07/27/17 109 lb 6.4 oz (49.6 kg)    Physical Exam Vitals signs and nursing note reviewed.  Constitutional:      General: She is not in acute distress.    Appearance: She is well-developed.  HENT:     Head: Normocephalic and atraumatic.     Right Ear: Tympanic membrane normal.     Left Ear: Tympanic membrane normal.     Nose: Mucosal edema and rhinorrhea present.     Right Sinus: No frontal sinus tenderness.     Left Sinus: No frontal sinus tenderness.     Mouth/Throat:     Pharynx: Posterior oropharyngeal erythema present. No oropharyngeal exudate.     Tonsils: No tonsillar abscesses.  Eyes:     Conjunctiva/sclera: Conjunctivae normal.     Pupils: Pupils are equal, round, and reactive to light.  Neck:     Musculoskeletal: Normal range of motion.  Cardiovascular:     Rate and Rhythm: Normal rate and regular rhythm.     Pulses: Normal pulses.     Heart sounds: Normal heart sounds.  Pulmonary:     Effort: Pulmonary effort is normal.  No respiratory distress.     Breath sounds: Normal breath sounds.  Abdominal:     General: Bowel sounds are normal.     Palpations: Abdomen is soft.  Skin:    General: Skin is warm and dry.     Findings: No rash.  Neurological:     Mental Status: She is alert and oriented to person, place, and time.     Deep Tendon Reflexes: Reflexes are normal and symmetric.  Psychiatric:        Behavior: Behavior normal.        Thought Content: Thought  content normal.        Judgment: Judgment normal.     Results for orders placed or performed in visit on 01/23/18  Microscopic Examination  Result Value Ref Range   WBC, UA 0-5 0 - 5 /hpf   RBC, UA 3-10 (A) 0 - 2 /hpf   Epithelial Cells (non renal) 0-10 0 - 10 /hpf   Renal Epithel, UA None seen None seen /hpf   Bacteria, UA None seen None seen/Few  Veritor Flu A/B Waived  Result Value Ref Range   Influenza A Negative Negative   Influenza B Negative Negative  Urinalysis, Complete  Result Value Ref Range   Specific Gravity, UA 1.015 1.005 - 1.030   pH, UA 6.0 5.0 - 7.5   Color, UA Yellow Yellow   Appearance Ur Clear Clear   Leukocytes, UA Negative Negative   Protein, UA Negative Negative/Trace   Glucose, UA Negative Negative   Ketones, UA Negative Negative   RBC, UA Trace (A) Negative   Bilirubin, UA Negative Negative   Urobilinogen, Ur 0.2 0.2 - 1.0 mg/dL   Nitrite, UA Negative Negative   Microscopic Examination See below:       Assessment & Plan:   1. Fever, unspecified fever cause - Veritor Flu A/B Waived  2. Dysuria - Urine Culture - Urinalysis, Complete - Microscopic Examination  3. Influenza - oseltamivir (TAMIFLU) 75 MG capsule; Take 1 capsule (75 mg total) by mouth 2 (two) times daily.  Dispense: 10 capsule; Refill: 0 - benzonatate (TESSALON) 200 MG capsule; Take 1 capsule (200 mg total) by mouth 2 (two) times daily as needed for cough.  Dispense: 30 capsule; Refill: 0   Continue all other maintenance medications as  listed above.  Follow up plan: No follow-ups on file.  Educational handout given for survey  Remus Loffler PA-C [facility] [address]  [place], [place] [address] [phone]   01/24/2018, 5:23 PM

## 2018-01-25 LAB — URINE CULTURE

## 2018-01-30 ENCOUNTER — Other Ambulatory Visit: Payer: Self-pay | Admitting: Physician Assistant

## 2018-03-01 ENCOUNTER — Ambulatory Visit: Payer: BLUE CROSS/BLUE SHIELD | Admitting: Family Medicine

## 2018-03-01 ENCOUNTER — Encounter: Payer: Self-pay | Admitting: Family Medicine

## 2018-03-01 VITALS — BP 130/85 | HR 90 | Temp 99.4°F | Ht 60.0 in | Wt 109.0 lb

## 2018-03-01 DIAGNOSIS — K644 Residual hemorrhoidal skin tags: Secondary | ICD-10-CM

## 2018-03-01 MED ORDER — TRIAMCINOLONE ACETONIDE 0.1 % EX CREA: 1.0000 "application " | TOPICAL_CREAM | Freq: Two times a day (BID) | CUTANEOUS | 0 refills | Status: DC

## 2018-03-01 NOTE — Progress Notes (Signed)
Chief Complaint  Patient presents with  . Hemorrhoids    pt here today c/o pain with her hemorrhoids     HPI  Patient presents today for several days of itching burning and throbbing at the rectum.  She has been off of her normal schedule due to her husband's illness.  He just came home from hospital today.  She has been off of her regular diet and regimen for her bowels because of this.  For the last several days she has had some constipation.  She is not getting relief from her Preparation H.  She has had no rectal bleeding.  She has tenesmus.  No abdominal pain.  PMH: Smoking status noted ROS: Per HPI  Objective: BP 130/85   Pulse 90   Temp 99.4 F (37.4 C) (Oral)   Ht 5' (1.524 m)   Wt 109 lb (49.4 kg)   BMI 21.29 kg/m  Gen: NAD, alert, cooperative with exam HEENT: NCAT, EOMI, PERRL CV: RRR, good S1/S2, no murmur Resp: CTABL, no wheezes, non-labored Abd: SNTND,   Rectal shows a 1 cm hemorrhoid.  It is erythematous but not engorged.  It is quite tender to light touch.  There are no other lesions including fissures  Ext: No edema, warm Neuro: Alert and oriented, No gross deficits  Assessment and plan:  1. External hemorrhoid     Meds ordered this encounter  Medications  . triamcinolone cream (KENALOG) 0.1 %    Sig: Apply 1 application topically 2 (two) times daily.    Dispense:  45 g    Refill:  0    No orders of the defined types were placed in this encounter.   Follow up as needed.  Claretta Fraise, MD

## 2018-03-24 ENCOUNTER — Other Ambulatory Visit: Payer: Self-pay | Admitting: Physician Assistant

## 2018-03-24 ENCOUNTER — Telehealth: Payer: Self-pay | Admitting: Physician Assistant

## 2018-03-24 MED ORDER — PREDNISONE 10 MG (21) PO TBPK: ORAL_TABLET | ORAL | 0 refills | Status: DC

## 2018-03-24 NOTE — Telephone Encounter (Signed)
She only mentioned URI symptoms, so I will send prednisone for her joints

## 2018-03-24 NOTE — Telephone Encounter (Signed)
Sounds like a viral illness, just a few days   - Use a cool mist humidifier  -Use saline nose sprays frequently -Force fluids -For any cough or congestion  Use plain Mucinex- regular strength or max strength is fine -For fever or aces or pains- take tylenol or ibuprofen. -Throat lozenges if help -New toothbrush in 3 days

## 2018-03-24 NOTE — Telephone Encounter (Signed)
[facility]  SWITCHBOARD  SICK CALL SCREENING   03/24/2018  Ruth Terry    GOV:[id number]    DOB:19-Aug-1955  Best Contact Telephone Number: [phone]   2. Symptoms: Aching in hands and feet, sniffing, headache. Feels stopped up  2. Symptom Onset: 2 1/2 days  3. Have you traveled any over the past 14 days? No  4.   Have you been in recent contact with someone that has tested positive for COVID-19? No  5.   Which pharmacy would you use today if given a prescription? [facility] [place] was informed that this information will be given to a clinical staff member to review and that they should receive a follow-up telephone call within 24 hours. she was advised to call back if she develops any new symptoms or if her current symptoms worsen.   Screened by: Cline Crock

## 2018-03-24 NOTE — Progress Notes (Signed)
sterapred

## 2018-03-24 NOTE — Telephone Encounter (Signed)
Patient aware.

## 2018-03-24 NOTE — Telephone Encounter (Signed)
Patient aware.  Patient is stating that pain in joints is related to arthritis and tylenol or ibuprofen is not helping and would like prednisone

## 2018-04-07 ENCOUNTER — Other Ambulatory Visit: Payer: Self-pay | Admitting: *Deleted

## 2018-04-07 MED ORDER — LINACLOTIDE 145 MCG PO CAPS: ORAL_CAPSULE | ORAL | 1 refills | Status: DC

## 2018-04-12 ENCOUNTER — Telehealth: Payer: Self-pay

## 2018-04-12 ENCOUNTER — Other Ambulatory Visit: Payer: Self-pay | Admitting: Physician Assistant

## 2018-04-12 MED ORDER — PLECANATIDE 3 MG PO TABS: 3.0000 mg | ORAL_TABLET | Freq: Every day | ORAL | 5 refills | Status: DC

## 2018-04-12 NOTE — Telephone Encounter (Signed)
Patient's insurance has denied Linzess.  Must try Trulance.

## 2018-04-12 NOTE — Telephone Encounter (Signed)
New prescription has been sent to [facility] [place] for true [facility].

## 2018-04-17 ENCOUNTER — Telehealth: Payer: Self-pay

## 2018-04-17 NOTE — Telephone Encounter (Signed)
Both are Guanylate cyclase-C receptor agonists.   stimulates intestinal fluid secretion and transit.

## 2018-04-17 NOTE — Telephone Encounter (Signed)
You can let her know that they are in the same group of medication

## 2018-04-17 NOTE — Telephone Encounter (Signed)
Patient had previously been prescribed Linzess until it was denied by her insurance company and was switched to [employer].  Patient would like to know what the difference is in these two medications.  Please advise.

## 2018-04-18 NOTE — Telephone Encounter (Signed)
Pt aware - same type of meds and will give it a try

## 2018-04-21 ENCOUNTER — Telehealth: Payer: Self-pay | Admitting: Physician Assistant

## 2018-04-21 NOTE — Telephone Encounter (Signed)
Then stop the trulance and call next week and let us know if it is improved.

## 2018-04-25 NOTE — Telephone Encounter (Signed)
Patient has a follow up tele visit  appointment scheduled.

## 2018-04-26 ENCOUNTER — Other Ambulatory Visit: Payer: Self-pay | Admitting: Physician Assistant

## 2018-04-26 ENCOUNTER — Encounter: Payer: Self-pay | Admitting: Physician Assistant

## 2018-04-26 ENCOUNTER — Other Ambulatory Visit: Payer: Self-pay

## 2018-04-26 ENCOUNTER — Ambulatory Visit (INDEPENDENT_AMBULATORY_CARE_PROVIDER_SITE_OTHER): Payer: BLUE CROSS/BLUE SHIELD | Admitting: Physician Assistant

## 2018-04-26 DIAGNOSIS — R300 Dysuria: Secondary | ICD-10-CM | POA: Diagnosis not present

## 2018-04-26 DIAGNOSIS — K5904 Chronic idiopathic constipation: Secondary | ICD-10-CM | POA: Diagnosis not present

## 2018-04-26 LAB — URINALYSIS, COMPLETE
Bilirubin, UA: NEGATIVE
Ketones, UA: NEGATIVE
Leukocytes,UA: NEGATIVE
Nitrite, UA: POSITIVE — AB
Protein,UA: NEGATIVE
RBC, UA: NEGATIVE
Specific Gravity, UA: 1.015 (ref 1.005–1.030)
Urobilinogen, Ur: 0.2 mg/dL (ref 0.2–1.0)
pH, UA: 5.5 (ref 5.0–7.5)

## 2018-04-26 LAB — MICROSCOPIC EXAMINATION
RBC: NONE SEEN /hpf (ref 0–2)
WBC, UA: NONE SEEN /hpf (ref 0–5)

## 2018-04-26 MED ORDER — CEPHALEXIN 500 MG PO CAPS: 500.0000 mg | ORAL_CAPSULE | Freq: Three times a day (TID) | ORAL | 0 refills | Status: DC

## 2018-04-26 MED ORDER — LINACLOTIDE 145 MCG PO CAPS: 145.0000 ug | ORAL_CAPSULE | Freq: Every day | ORAL | 5 refills | Status: DC

## 2018-04-26 MED ORDER — FLUCONAZOLE 150 MG PO TABS: 150.0000 mg | ORAL_TABLET | Freq: Once | ORAL | 0 refills | Status: AC

## 2018-04-26 NOTE — Addendum Note (Signed)
Addended by: Liliane Bade on: 04/26/2018 12:39 PM   Modules accepted: Orders

## 2018-04-26 NOTE — Progress Notes (Signed)
Failed amitiza Failed trulance     Telephone visit  Subjective: [name], chronic PCP: Terald Sleeper, PA-C Ruth Terry is a 63 y.o. female calls for telephone consult today. Patient provides verbal consent for consult held via phone.  Patient is identified with 2 separate identifiers.  At this time the entire area is on COVID-19 social distancing and stay home orders are in place.  Patient is of higher risk and therefore we are performing this by a virtual method.  Location of provider: home Location of patient: work Others present for call: no  Chronic constipation, she has had this for her whole life.  She has tried everything OTC for it, miralax, fiber, laxatives.  She had been on AMITIZA in the past with no relief of her symptoms Her gastroenterologist started her on Linzess and she had excellent results.  This year her insurance is requiring TRULANCE as the preferred treatment.  She has been taking it but less effective and having abdominal and pelvic pain. Will work on new prior [place]. A script for Linzess is sent to the pharmacy  This patient has had several days of dysuria, frequency and nocturia. There is also pain over the bladder in the suprapubic region, no back pain. Denies leakage or hematuria.  Denies fever or chills. No pain in flank area.  ROS: Per HPI  Allergies  Allergen Reactions  . Chlordiazepoxide-Clidinium Itching  . Ciprofloxacin Itching    Irritates stomach  . Flagyl [Metronidazole]     Severe yeast infection  . Sulfa Drugs Cross Reactors Nausea Only  . Nitrofurantoin Monohyd Macro Rash   Past Medical History:  Diagnosis Date  . Anal fissure   . Chronic constipation   . Diverticulosis 04-20-10   colonoscopy  . Gastroparesis   . GERD (gastroesophageal reflux disease)   . Hemorrhoids   . Hyperlipidemia   . IBS (irritable bowel syndrome)   . MVP (mitral valve prolapse)   . Polyp, stomach 04-20-10   egd  . Shingles   . UTI (lower  urinary tract infection)     Current Outpatient Medications:  .  AMBULATORY NON FORMULARY MEDICATION, Medication Name: Domperidone 10 mg Take 1 tablet at bedtime daily. (Patient taking differently: Take 1 tablet by mouth at bedtime. Medication Name: Domperidone 10 mg Take 1 tablet at bedtime daily.), Disp: 30 tablet, Rfl: 11 .  AMBULATORY NON FORMULARY MEDICATION, Take 1 tablet by mouth at bedtime. Medication Name: Domperidone 10mg , Disp: 90 tablet, Rfl: 1 .  amitriptyline (ELAVIL) 25 MG tablet, Take 1-2 tablets (25-50 mg total) by mouth at bedtime., Disp: 60 tablet, Rfl: 5 .  DEXILANT 60 MG capsule, TAKE 1 CAPSULE BY MOUTH ONCE DAILY, Disp: 30 capsule, Rfl: 5 .  dicyclomine (BENTYL) 10 MG capsule, Take 1 capsule (10 mg total) by mouth every 4 (four) hours as needed for spasms., Disp: 30 capsule, Rfl: 2 .  hydrocortisone (ANUSOL-HC) 2.5 % rectal cream, Place 1 application rectally 2 (two) times daily. Please fill as Anusol HC cream, insurance preference, Disp: 30 g, Rfl: 0 .  hydrocortisone-pramoxine (PROCTOFOAM-HC) rectal foam, Place 1 applicator rectally 2 (two) times daily., Disp: 10 g, Rfl: 2 .  linaclotide (LINZESS) 145 MCG CAPS capsule, Take 1 capsule (145 mcg total) by mouth daily before breakfast., Disp: 30 capsule, Rfl: 5 .  Meth-Hyo-M Bl-Na Phos-Ph Sal (URIBEL) 118 MG CAPS, Take 1 capsule (118 mg total) by mouth 4 (four) times daily. (Patient taking differently: Take 1 capsule by mouth 4 (four) times daily  as needed (kidneys). ), Disp: 120 capsule, Rfl: 1 .  Plecanatide (TRULANCE) 3 MG TABS, Take 3 mg by mouth daily., Disp: 30 tablet, Rfl: 5 .  polyethylene glycol powder (GLYCOLAX/MIRALAX) powder, Take 17 g by mouth as needed for mild constipation. , Disp: , Rfl:  .  predniSONE (STERAPRED UNI-PAK 21 TAB) 10 MG (21) TBPK tablet, As directed x 6 days, Disp: 21 tablet, Rfl: 0 .  Probiotic Product (ALIGN) 4 MG CAPS, Take 1 capsule by mouth daily., Disp: , Rfl:  .  psyllium (METAMUCIL) 58.6 %  packet, Take 1 packet by mouth daily., Disp: , Rfl:  .  Simethicone (PHAZYME MAXIMUM STRENGTH) 250 MG CAPS, Take 250 mg by mouth 3 (three) times daily. (Patient taking differently: Take 250 mg by mouth 3 (three) times daily as needed (gas relief). ), Disp: 14 capsule, Rfl:  .  triamcinolone cream (KENALOG) 0.1 %, Apply 1 application topically 2 (two) times daily., Disp: 45 g, Rfl: 0 .  valACYclovir (VALTREX) 1000 MG tablet, Take 1 tablet (1,000 mg total) by mouth at bedtime., Disp: 90 tablet, Rfl: 4  Assessment/ Plan: 62 y.o. female   1. Dysuria - Urinalysis, Complete; Future - Urine Culture; Future  2. Chronic idiopathic constipation - linaclotide (LINZESS) 145 MCG CAPS capsule; Take 1 capsule (145 mcg total) by mouth daily before breakfast.  Dispense: 30 capsule; Refill: 5   Start time: 10:33 AM End time: 10:46 AM  Meds ordered this encounter  Medications  . linaclotide (LINZESS) 145 MCG CAPS capsule    Sig: Take 1 capsule (145 mcg total) by mouth daily before breakfast.    Dispense:  30 capsule    Refill:  5    Order Specific Question:   Supervising Provider    Answer:   [name] [[id number]]    Particia Nearing PA-C [place] [phone]

## 2018-04-27 LAB — URINE CULTURE

## 2018-04-28 ENCOUNTER — Other Ambulatory Visit: Payer: Self-pay | Admitting: *Deleted

## 2018-04-28 DIAGNOSIS — I700 Atherosclerosis of aorta: Secondary | ICD-10-CM

## 2018-04-28 DIAGNOSIS — Z0000 Encounter for general adult medical examination without abnormal findings: Secondary | ICD-10-CM

## 2018-04-28 DIAGNOSIS — Z87440 Personal history of urinary (tract) infections: Secondary | ICD-10-CM

## 2018-04-28 DIAGNOSIS — E781 Pure hyperglyceridemia: Secondary | ICD-10-CM

## 2018-04-28 DIAGNOSIS — R81 Glycosuria: Secondary | ICD-10-CM

## 2018-05-08 ENCOUNTER — Other Ambulatory Visit: Payer: Self-pay

## 2018-05-08 ENCOUNTER — Other Ambulatory Visit: Payer: BLUE CROSS/BLUE SHIELD

## 2018-05-08 ENCOUNTER — Other Ambulatory Visit: Payer: Self-pay | Admitting: Physician Assistant

## 2018-05-08 DIAGNOSIS — Z87440 Personal history of urinary (tract) infections: Secondary | ICD-10-CM

## 2018-05-08 DIAGNOSIS — R81 Glycosuria: Secondary | ICD-10-CM | POA: Diagnosis not present

## 2018-05-08 DIAGNOSIS — Z0000 Encounter for general adult medical examination without abnormal findings: Secondary | ICD-10-CM

## 2018-05-08 DIAGNOSIS — M2550 Pain in unspecified joint: Secondary | ICD-10-CM

## 2018-05-08 DIAGNOSIS — M25561 Pain in right knee: Secondary | ICD-10-CM

## 2018-05-08 DIAGNOSIS — M25551 Pain in right hip: Secondary | ICD-10-CM

## 2018-05-08 DIAGNOSIS — M25562 Pain in left knee: Secondary | ICD-10-CM

## 2018-05-08 DIAGNOSIS — E781 Pure hyperglyceridemia: Secondary | ICD-10-CM | POA: Diagnosis not present

## 2018-05-08 DIAGNOSIS — I700 Atherosclerosis of aorta: Secondary | ICD-10-CM

## 2018-05-08 DIAGNOSIS — G8929 Other chronic pain: Secondary | ICD-10-CM

## 2018-05-08 DIAGNOSIS — M25552 Pain in left hip: Secondary | ICD-10-CM

## 2018-05-08 LAB — URINALYSIS, COMPLETE
Bilirubin, UA: NEGATIVE
Glucose, UA: NEGATIVE
Ketones, UA: NEGATIVE
Leukocytes,UA: NEGATIVE
Nitrite, UA: NEGATIVE
Protein,UA: NEGATIVE
RBC, UA: NEGATIVE
Specific Gravity, UA: 1.02 (ref 1.005–1.030)
Urobilinogen, Ur: 0.2 mg/dL (ref 0.2–1.0)
pH, UA: 5.5 (ref 5.0–7.5)

## 2018-05-08 LAB — BAYER DCA HB A1C WAIVED: HB A1C (BAYER DCA - WAIVED): 5.5 % (ref <7.0)

## 2018-05-08 LAB — MICROSCOPIC EXAMINATION
Bacteria, UA: NONE SEEN
RBC, Urine: NONE SEEN /hpf (ref 0–2)
Renal Epithel, UA: NONE SEEN /hpf

## 2018-05-09 LAB — CMP14+EGFR
ALT: 11 IU/L (ref 0–32)
AST: 13 IU/L (ref 0–40)
Albumin/Globulin Ratio: 2 (ref 1.2–2.2)
Albumin: 4.3 g/dL (ref 3.8–4.9)
Alkaline Phosphatase: 72 IU/L (ref 39–117)
BUN/Creatinine Ratio: 10 — ABNORMAL LOW (ref 12–28)
BUN: 7 mg/dL — ABNORMAL LOW (ref 8–27)
Bilirubin Total: 0.2 mg/dL (ref 0.0–1.2)
CO2: 20 mmol/L (ref 20–29)
Calcium: 9.7 mg/dL (ref 8.7–10.3)
Chloride: 106 mmol/L (ref 96–106)
Creatinine, Ser: 0.7 mg/dL (ref 0.57–1.00)
GFR calc Af Amer: 109 mL/min/{1.73_m2} (ref >59)
GFR calc non Af Amer: 94 mL/min/{1.73_m2} (ref >59)
Globulin, Total: 2.2 g/dL (ref 1.5–4.5)
Glucose: 93 mg/dL (ref 65–99)
Potassium: 4.1 mmol/L (ref 3.5–5.2)
Sodium: 143 mmol/L (ref 134–144)
Total Protein: 6.5 g/dL (ref 6.0–8.5)

## 2018-05-09 LAB — THYROID PANEL WITH TSH
Free Thyroxine Index: 1.6 (ref 1.2–4.9)
T3 Uptake Ratio: 23 % — ABNORMAL LOW (ref 24–39)
T4, Total: 6.9 ug/dL (ref 4.5–12.0)
TSH: 1.34 u[IU]/mL (ref 0.450–4.500)

## 2018-05-09 LAB — LIPID PANEL
Chol/HDL Ratio: 3.7 ratio (ref 0.0–4.4)
Cholesterol, Total: 209 mg/dL — ABNORMAL HIGH (ref 100–199)
HDL: 56 mg/dL (ref >39)
LDL Calculated: 107 mg/dL — ABNORMAL HIGH (ref 0–99)
Triglycerides: 232 mg/dL — ABNORMAL HIGH (ref 0–149)
VLDL Cholesterol Cal: 46 mg/dL — ABNORMAL HIGH (ref 5–40)

## 2018-05-09 LAB — URINE CULTURE

## 2018-05-09 LAB — MICROALBUMIN / CREATININE URINE RATIO
Creatinine, Urine: 150.7 mg/dL
Microalb/Creat Ratio: 4 mg/g creat (ref 0–29)
Microalbumin, Urine: 6 ug/mL

## 2018-05-11 ENCOUNTER — Telehealth: Payer: Self-pay | Admitting: Physician Assistant

## 2018-05-11 LAB — CBC WITH DIFFERENTIAL/PLATELET
Basophils Absolute: 0 10*3/uL (ref 0.0–0.2)
Basos: 1 %
EOS (ABSOLUTE): 0.1 10*3/uL (ref 0.0–0.4)
Eos: 2 %
Hematocrit: 40.2 % (ref 34.0–46.6)
Hemoglobin: 13.8 g/dL (ref 11.1–15.9)
Immature Grans (Abs): 0 10*3/uL (ref 0.0–0.1)
Immature Granulocytes: 0 %
Lymphocytes Absolute: 1.4 10*3/uL (ref 0.7–3.1)
Lymphs: 30 %
MCH: 30.4 pg (ref 26.6–33.0)
MCHC: 34.3 g/dL (ref 31.5–35.7)
MCV: 89 fL (ref 79–97)
Monocytes Absolute: 0.4 10*3/uL (ref 0.1–0.9)
Monocytes: 8 %
Neutrophils Absolute: 2.7 10*3/uL (ref 1.4–7.0)
Neutrophils: 59 %
Platelets: 227 10*3/uL (ref 150–450)
RBC: 4.54 x10E6/uL (ref 3.77–5.28)
RDW: 13.4 % (ref 11.7–15.4)
WBC: 4.5 10*3/uL (ref 3.4–10.8)

## 2018-05-11 LAB — ANA,IFA RA DIAG PNL W/RFLX TIT/PATN
ANA Titer 1: NEGATIVE
Cyclic Citrullin Peptide Ab: 8 units (ref 0–19)
Rheumatoid fact SerPl-aCnc: <10 IU/mL (ref 0.0–13.9)

## 2018-05-12 NOTE — Telephone Encounter (Signed)
Pt would like her labs results. Can you address and I will call her.

## 2018-05-17 ENCOUNTER — Ambulatory Visit (INDEPENDENT_AMBULATORY_CARE_PROVIDER_SITE_OTHER): Payer: BLUE CROSS/BLUE SHIELD | Admitting: Physician Assistant

## 2018-05-17 ENCOUNTER — Other Ambulatory Visit: Payer: Self-pay

## 2018-05-17 DIAGNOSIS — K5904 Chronic idiopathic constipation: Secondary | ICD-10-CM | POA: Diagnosis not present

## 2018-05-17 MED ORDER — LINACLOTIDE 145 MCG PO CAPS: 145.0000 ug | ORAL_CAPSULE | Freq: Every day | ORAL | 5 refills | Status: DC

## 2018-05-18 ENCOUNTER — Encounter: Payer: Self-pay | Admitting: Nurse Practitioner

## 2018-05-18 ENCOUNTER — Ambulatory Visit: Payer: BLUE CROSS/BLUE SHIELD | Admitting: Nurse Practitioner

## 2018-05-18 ENCOUNTER — Other Ambulatory Visit: Payer: Self-pay

## 2018-05-18 VITALS — BP 126/83 | HR 81 | Temp 97.4°F | Ht 60.0 in | Wt 107.0 lb

## 2018-05-18 DIAGNOSIS — L249 Irritant contact dermatitis, unspecified cause: Secondary | ICD-10-CM

## 2018-05-18 MED ORDER — DESOXIMETASONE 0.25 % EX CREA: 1.0000 "application " | TOPICAL_CREAM | Freq: Two times a day (BID) | CUTANEOUS | 0 refills | Status: DC

## 2018-05-18 NOTE — Patient Instructions (Signed)
Contact Dermatitis  Dermatitis is redness, soreness, and swelling (inflammation) of the skin. Contact dermatitis is a reaction to something that touches the skin.  There are two types of contact dermatitis:   Irritant contact dermatitis. This happens when something bothers (irritates) your skin, like soap.   Allergic contact dermatitis. This is caused when you are exposed to something that you are allergic to, such as poison ivy.  What are the causes?   Common causes of irritant contact dermatitis include:  ? Makeup.  ? Soaps.  ? Detergents.  ? Bleaches.  ? Acids.  ? Metals, such as nickel.   Common causes of allergic contact dermatitis include:  ? Plants.  ? Chemicals.  ? Jewelry.  ? Latex.  ? Medicines.  ? Preservatives in products, such as clothing.  What increases the risk?   Having a job that exposes you to things that bother your skin.   Having asthma or eczema.  What are the signs or symptoms?  Symptoms may happen anywhere the irritant has touched your skin. Symptoms include:   Dry or flaky skin.   Redness.   Cracks.   Itching.   Pain or a burning feeling.   Blisters.   Blood or clear fluid draining from skin cracks.  With allergic contact dermatitis, swelling may occur. This may happen in places such as the eyelids, mouth, or genitals.  How is this treated?   This condition is treated by checking for the cause of the reaction and protecting your skin. Treatment may also include:  ? Steroid creams, ointments, or medicines.  ? Antibiotic medicines or other ointments, if you have a skin infection.  ? Lotion or medicines to help with itching.  ? A bandage (dressing).  Follow these instructions at home:  Skin care   Moisturize your skin as needed.   Put cool cloths on your skin.   Put a baking soda paste on your skin. Stir water into baking soda until it looks like a paste.   Do not scratch your skin.   Avoid having things rub up against your skin.   Avoid the use of soaps, perfumes, and  dyes.  Medicines   Take or apply over-the-counter and prescription medicines only as told by your doctor.   If you were prescribed an antibiotic medicine, take or apply it as told by your doctor. Do not stop using it even if your condition starts to get better.  Bathing   Take a bath with:  ? Epsom salts.  ? Baking soda.  ? Colloidal oatmeal.   Bathe less often.   Bathe in warm water. Avoid using hot water.  Bandage care   If you were given a bandage, change it as told by your health care provider.   Wash your hands with soap and water before and after you change your bandage. If soap and water are not available, use hand sanitizer.  General instructions   Avoid the things that caused your reaction. If you do not know what caused it, keep a journal. Write down:  ? What you eat.  ? What skin products you use.  ? What you drink.  ? What you wear in the area that has symptoms. This includes jewelry.   Check the affected areas every day for signs of infection. Check for:  ? More redness, swelling, or pain.  ? More fluid or blood.  ? Warmth.  ? Pus or a bad smell.   Keep all follow-up visits as   told by your doctor. This is important.  Contact a doctor if:   You do not get better with treatment.   Your condition gets worse.   You have signs of infection, such as:  ? More swelling.  ? Tenderness.  ? More redness.  ? Soreness.  ? Warmth.   You have a fever.   You have new symptoms.  Get help right away if:   You have a very bad headache.   You have neck pain.   Your neck is stiff.   You throw up (vomit).   You feel very sleepy.   You see red streaks coming from the area.   Your bone or joint near the area hurts after the skin has healed.   The area turns darker.   You have trouble breathing.  Summary   Dermatitis is redness, soreness, and swelling of the skin.   Symptoms may occur where the irritant has touched you.   Treatment may include medicines and skin care.   If you do not know what caused  your reaction, keep a journal.   Contact a doctor if your condition gets worse or you have signs of infection.  This information is not intended to replace advice given to you by your health care provider. Make sure you discuss any questions you have with your health care provider.  Document Released: 10/18/2008 Document Revised: 07/06/2017 Document Reviewed: 07/06/2017  Elsevier Interactive Patient Education  2019 [employer]

## 2018-05-18 NOTE — Progress Notes (Signed)
Subjective:    Patient ID: Ruth Terry, female    DOB: 11-30-1955, 63 y.o.   MRN: [id number]   Chief Complaint: Rash on legs   HPI Patient come sin today c/o rash on right thigh. Noticed it yesterday. Itches from time to time. denies being outside doing any yard work. Also denies any new soaps , detergents or lotion.   Review of Systems  Constitutional: Negative for activity change and appetite change.  HENT: Negative.   Eyes: Negative for pain.  Respiratory: Negative for shortness of breath.   Cardiovascular: Negative for chest pain, palpitations and leg swelling.  Gastrointestinal: Negative for abdominal pain.  Endocrine: Negative for polydipsia.  Genitourinary: Negative.   Skin: Negative for rash.  Neurological: Negative for dizziness, weakness and headaches.  Hematological: Does not bruise/bleed easily.  Psychiatric/Behavioral: Negative.   All other systems reviewed and are negative.      Objective:   Physical Exam Vitals signs and nursing note reviewed.  Constitutional:      Appearance: Normal appearance.  Cardiovascular:     Rate and Rhythm: Normal rate and regular rhythm.     Heart sounds: Normal heart sounds.  Pulmonary:     Effort: Pulmonary effort is normal.     Breath sounds: Normal breath sounds.  Abdominal:     General: Abdomen is flat.     Palpations: Abdomen is soft.  Skin:    General: Skin is warm and dry.     Findings: Rash present.  Neurological:     General: No focal deficit present.     Mental Status: She is alert and oriented to person, place, and time.  Psychiatric:        Mood and Affect: Mood normal.     BP 126/83   Pulse 81   Temp (!) 97.4 F (36.3 C) (Oral)   Ht 5' (1.524 m)   Wt 107 lb (48.5 kg)   BMI 20.90 kg/m          Assessment & Plan:  Ruth Terry in today with chief complaint of Rash on legs   1. Irritant contact dermatitis, unspecified trigger Avoid scratching Cool compresses RTO prn -  desoximetasone (TOPICORT) 0.25 % cream; Apply 1 application topically 2 (two) times daily.  Dispense: 30 g; Refill: 0   Mary-Margaret Daphine Deutscher, FNP

## 2018-05-19 ENCOUNTER — Telehealth: Payer: Self-pay

## 2018-05-19 NOTE — Telephone Encounter (Signed)
FYI, patient's Linzess was finally approved.

## 2018-05-22 ENCOUNTER — Encounter: Payer: Self-pay | Admitting: Physician Assistant

## 2018-05-22 DIAGNOSIS — S93332D Other subluxation of left foot, subsequent encounter: Secondary | ICD-10-CM | POA: Diagnosis not present

## 2018-05-22 DIAGNOSIS — M2042 Other hammer toe(s) (acquired), left foot: Secondary | ICD-10-CM | POA: Diagnosis not present

## 2018-05-22 DIAGNOSIS — S93331D Other subluxation of right foot, subsequent encounter: Secondary | ICD-10-CM | POA: Diagnosis not present

## 2018-05-22 DIAGNOSIS — M2041 Other hammer toe(s) (acquired), right foot: Secondary | ICD-10-CM | POA: Diagnosis not present

## 2018-05-22 NOTE — Progress Notes (Signed)
Telephone visit  Subjective: CC:n  Constipation PCP: Terald Sleeper, PA-C HTD:[id number] Ruth Terry is a 63 y.o. female calls for telephone consult today. Patient provides verbal consent for consult held via phone.  Patient is identified with 2 separate identifiers.  At this time the entire area is on COVID-19 social distancing and stay home orders are in place.  Patient is of higher risk and therefore we are performing this by a virtual method.  Location of patient: Home Location of provider: [facility] Others present for call: No  The patient has continued having significant back pain and GI pain.  She has chronic constipation.  Since starting the Trulance she has had no improvement.  She actually has had more pelvic pain.  In the past she had taken Linzess without any difficulty.  We will work on getting a prior authorization for the [place] because of the failure on true [place].   ROS: Per HPI  Allergies  Allergen Reactions  . Chlordiazepoxide-Clidinium Itching  . Ciprofloxacin Itching    Irritates stomach  . Flagyl [Metronidazole]     Severe yeast infection  . Sulfa Drugs Cross Reactors Nausea Only  . Nitrofurantoin Monohyd Macro Rash   Past Medical History:  Diagnosis Date  . Anal fissure   . Chronic constipation   . Diverticulosis 04-20-10   colonoscopy  . Gastroparesis   . GERD (gastroesophageal reflux disease)   . Hemorrhoids   . Hyperlipidemia   . IBS (irritable bowel syndrome)   . MVP (mitral valve prolapse)   . Polyp, stomach 04-20-10   egd  . Shingles   . UTI (lower urinary tract infection)     Current Outpatient Medications:  .  AMBULATORY NON FORMULARY MEDICATION, Medication Name: Domperidone 10 mg Take 1 tablet at bedtime daily. (Patient taking differently: Take 1 tablet by mouth at bedtime. Medication Name: Domperidone 10 mg Take 1 tablet at bedtime daily.), Disp: 30 tablet, Rfl: 11 .  AMBULATORY NON FORMULARY MEDICATION, Take 1 tablet by mouth at bedtime.  Medication Name: Domperidone 10mg , Disp: 90 tablet, Rfl: 1 .  amitriptyline (ELAVIL) 25 MG tablet, Take 1-2 tablets (25-50 mg total) by mouth at bedtime., Disp: 60 tablet, Rfl: 5 .  desoximetasone (TOPICORT) 0.25 % cream, Apply 1 application topically 2 (two) times daily., Disp: 30 g, Rfl: 0 .  DEXILANT 60 MG capsule, TAKE 1 CAPSULE BY MOUTH ONCE DAILY, Disp: 30 capsule, Rfl: 5 .  dicyclomine (BENTYL) 10 MG capsule, Take 1 capsule (10 mg total) by mouth every 4 (four) hours as needed for spasms., Disp: 30 capsule, Rfl: 2 .  hydrocortisone (ANUSOL-HC) 2.5 % rectal cream, Place 1 application rectally 2 (two) times daily. Please fill as Anusol HC cream, insurance preference, Disp: 30 g, Rfl: 0 .  hydrocortisone-pramoxine (PROCTOFOAM-HC) rectal foam, Place 1 applicator rectally 2 (two) times daily., Disp: 10 g, Rfl: 2 .  linaclotide (LINZESS) 145 MCG CAPS capsule, Take 1 capsule (145 mcg total) by mouth daily before breakfast. Failing TRULANCE, Disp: 30 capsule, Rfl: 5 .  Meth-Hyo-M Bl-Na Phos-Ph Sal (URIBEL) 118 MG CAPS, Take 1 capsule (118 mg total) by mouth 4 (four) times daily. (Patient taking differently: Take 1 capsule by mouth 4 (four) times daily as needed (kidneys). ), Disp: 120 capsule, Rfl: 1 .  polyethylene glycol powder (GLYCOLAX/MIRALAX) powder, Take 17 g by mouth as needed for mild constipation. , Disp: , Rfl:  .  Probiotic Product (ALIGN) 4 MG CAPS, Take 1 capsule by mouth daily., Disp: ,  Rfl:  .  psyllium (METAMUCIL) 58.6 % packet, Take 1 packet by mouth daily., Disp: , Rfl:  .  Simethicone (PHAZYME MAXIMUM STRENGTH) 250 MG CAPS, Take 250 mg by mouth 3 (three) times daily. (Patient taking differently: Take 250 mg by mouth 3 (three) times daily as needed (gas relief). ), Disp: 14 capsule, Rfl:  .  triamcinolone cream (KENALOG) 0.1 %, Apply 1 application topically 2 (two) times daily., Disp: 45 g, Rfl: 0 .  valACYclovir (VALTREX) 1000 MG tablet, Take 1 tablet (1,000 mg total) by mouth at  bedtime., Disp: 90 tablet, Rfl: 4  Assessment/ Plan: 63 y.o. female   1. Chronic idiopathic constipation - linaclotide (LINZESS) 145 MCG CAPS capsule; Take 1 capsule (145 mcg total) by mouth daily before breakfast. Failing TRULANCE  Dispense: 30 capsule; Refill: 5   Start time: 11:05 AM End time: 11:12 AM  Meds ordered this encounter  Medications  . linaclotide (LINZESS) 145 MCG CAPS capsule    Sig: Take 1 capsule (145 mcg total) by mouth daily before breakfast. Failing TRULANCE    Dispense:  30 capsule    Refill:  5    Order Specific Question:   Supervising Provider    Answer:   [name] [[id number]]    Particia Nearing PA-C [place] [phone]

## 2018-05-31 ENCOUNTER — Telehealth: Payer: Self-pay | Admitting: Internal Medicine

## 2018-05-31 NOTE — Telephone Encounter (Signed)
Pls call pt, she would like speak with you about a medication that she gets from [place].

## 2018-06-07 NOTE — Telephone Encounter (Signed)
Patient would like a refill of her Domperidone.  Last office visit 03/29/2017.  Please advise.  Thanks.

## 2018-06-07 NOTE — Telephone Encounter (Signed)
Refill as previous

## 2018-06-08 MED ORDER — AMBULATORY NON FORMULARY MEDICATION: 1.0000 | Freq: Every day | 2 refills | Status: DC

## 2018-06-08 NOTE — Telephone Encounter (Signed)
Spoke with patient and let her know that I faxed her Domperidone rx to the [place] [facility] pharmacy.  Patient aware.

## 2018-06-28 ENCOUNTER — Ambulatory Visit: Payer: BLUE CROSS/BLUE SHIELD | Admitting: Physician Assistant

## 2018-07-05 ENCOUNTER — Telehealth: Payer: Self-pay | Admitting: Physician Assistant

## 2018-07-05 NOTE — Telephone Encounter (Signed)
Patient aware that visit will need to be televisit.

## 2018-07-06 ENCOUNTER — Ambulatory Visit (INDEPENDENT_AMBULATORY_CARE_PROVIDER_SITE_OTHER): Payer: BC Managed Care – PPO | Admitting: Physician Assistant

## 2018-07-06 ENCOUNTER — Other Ambulatory Visit: Payer: Self-pay

## 2018-07-06 DIAGNOSIS — M2550 Pain in unspecified joint: Secondary | ICD-10-CM

## 2018-07-06 DIAGNOSIS — K5904 Chronic idiopathic constipation: Secondary | ICD-10-CM | POA: Diagnosis not present

## 2018-07-06 MED ORDER — DICLOFENAC SODIUM 1 % TD GEL: 4.0000 g | Freq: Four times a day (QID) | TRANSDERMAL | 5 refills | Status: DC

## 2018-07-10 ENCOUNTER — Encounter: Payer: Self-pay | Admitting: Physician Assistant

## 2018-07-10 NOTE — Progress Notes (Signed)
Telephone visit  Subjective: UX:[id number] PCP: Terald Sleeper, PA-C KYH:CWCBJSE Ruth Terry is a 63 y.o. female calls for telephone consult today. Patient provides verbal consent for consult held via phone.  Patient is identified with 2 separate identifiers.  At this time the entire area is on COVID-19 social distancing and stay home orders are in place.  Patient is of higher risk and therefore we are performing this by a virtual method.  Location of patient: Home Location of provider: [facility] Others present for call: no  This patient is having a phone visit to follow-up on her chronic constipation issues.  For some time she had taken Linzess and had done very well with it.  However insurance required her to try truly is because it was the preferred 1 this year.  It did not work as well.  And she has gone back to Linzess 145.  She always had problems with Linzess 290 being too strong.  And she has been taking it again for about a month.  And she is still having a little bit of difficulty with her bowel meds.  She states that she will still have to strain.  She may go about 5 times a week.  She has not been taking any other stool softeners so she will go back to using that.  Patient complains also of significant pain in the wrist, elbow, arm and fingers.  She will be quite sore at times.  Has not had any specific injuries.   ROS: Per HPI  Allergies  Allergen Reactions  . Chlordiazepoxide-Clidinium Itching  . Ciprofloxacin Itching    Irritates stomach  . Flagyl [Metronidazole]     Severe yeast infection  . Sulfa Drugs Cross Reactors Nausea Only  . Nitrofurantoin Monohyd Macro Rash   Past Medical History:  Diagnosis Date  . Anal fissure   . Chronic constipation   . Diverticulosis 04-20-10   colonoscopy  . Gastroparesis   . GERD (gastroesophageal reflux disease)   . Hemorrhoids   . Hyperlipidemia   . IBS (irritable bowel syndrome)   . MVP (mitral valve prolapse)   .  Polyp, stomach 04-20-10   egd  . Shingles   . UTI (lower urinary tract infection)     Current Outpatient Medications:  .  AMBULATORY NON FORMULARY MEDICATION, Medication Name: Domperidone 10 mg Take 1 tablet at bedtime daily. (Patient taking differently: Take 1 tablet by mouth at bedtime. Medication Name: Domperidone 10 mg Take 1 tablet at bedtime daily.), Disp: 30 tablet, Rfl: 11 .  AMBULATORY NON FORMULARY MEDICATION, Take 1 tablet by mouth at bedtime. Medication Name: Domperidone 10mg , Disp: 90 tablet, Rfl: 2 .  amitriptyline (ELAVIL) 25 MG tablet, Take 1-2 tablets (25-50 mg total) by mouth at bedtime., Disp: 60 tablet, Rfl: 5 .  desoximetasone (TOPICORT) 0.25 % cream, Apply 1 application topically 2 (two) times daily., Disp: 30 g, Rfl: 0 .  DEXILANT 60 MG capsule, TAKE 1 CAPSULE BY MOUTH ONCE DAILY, Disp: 30 capsule, Rfl: 5 .  diclofenac sodium (VOLTAREN) 1 % GEL, Apply 4 g topically 4 (four) times daily., Disp: 200 g, Rfl: 5 .  dicyclomine (BENTYL) 10 MG capsule, Take 1 capsule (10 mg total) by mouth every 4 (four) hours as needed for spasms., Disp: 30 capsule, Rfl: 2 .  hydrocortisone (ANUSOL-HC) 2.5 % rectal cream, Place 1 application rectally 2 (two) times daily. Please fill as Anusol HC cream, insurance preference, Disp: 30 g, Rfl: 0 .  hydrocortisone-pramoxine (PROCTOFOAM-HC) rectal foam, Place 1 applicator rectally 2 (two) times daily., Disp: 10 g, Rfl: 2 .  linaclotide (LINZESS) 145 MCG CAPS capsule, Take 1 capsule (145 mcg total) by mouth daily before breakfast. Failing TRULANCE, Disp: 30 capsule, Rfl: 5 .  Meth-Hyo-M Bl-Na Phos-Ph Sal (URIBEL) 118 MG CAPS, Take 1 capsule (118 mg total) by mouth 4 (four) times daily. (Patient taking differently: Take 1 capsule by mouth 4 (four) times daily as needed (kidneys). ), Disp: 120 capsule, Rfl: 1 .  polyethylene glycol powder (GLYCOLAX/MIRALAX) powder, Take 17 g by mouth as needed for mild constipation. , Disp: , Rfl:  .  Probiotic Product  (ALIGN) 4 MG CAPS, Take 1 capsule by mouth daily., Disp: , Rfl:  .  psyllium (METAMUCIL) 58.6 % packet, Take 1 packet by mouth daily., Disp: , Rfl:  .  Simethicone (PHAZYME MAXIMUM STRENGTH) 250 MG CAPS, Take 250 mg by mouth 3 (three) times daily. (Patient taking differently: Take 250 mg by mouth 3 (three) times daily as needed (gas relief). ), Disp: 14 capsule, Rfl:  .  triamcinolone cream (KENALOG) 0.1 %, Apply 1 application topically 2 (two) times daily., Disp: 45 g, Rfl: 0 .  valACYclovir (VALTREX) 1000 MG tablet, Take 1 tablet (1,000 mg total) by mouth at bedtime., Disp: 90 tablet, Rfl: 4  Assessment/ Plan: 63 y.o. female   1. Chronic idiopathic constipation Continue Linzess 145 1 daily Continue other MiraLAX or stool softener daily  2.  Joint pain Lidocaine gel sent to the pharmacy.   Continue all other maintenance medications as listed above.  Start time: 9:10 AM End time: 9:24 AM  Meds ordered this encounter  Medications  . diclofenac sodium (VOLTAREN) 1 % GEL    Sig: Apply 4 g topically 4 (four) times daily.    Dispense:  200 g    Refill:  5    Order Specific Question:   Supervising Provider    Answer:   [name] [[id number]]    Particia Nearing PA-C [place] [phone]

## 2018-07-11 ENCOUNTER — Telehealth: Payer: Self-pay | Admitting: *Deleted

## 2018-07-11 NOTE — Telephone Encounter (Signed)
Prior Auth Request for Diclofenac Sodium 1% gel-Approved   Approvedtoday Effective from 07/06/2018 through 07/04/2021.

## 2018-07-17 ENCOUNTER — Ambulatory Visit (INDEPENDENT_AMBULATORY_CARE_PROVIDER_SITE_OTHER): Payer: BC Managed Care – PPO | Admitting: Family Medicine

## 2018-07-17 DIAGNOSIS — J40 Bronchitis, not specified as acute or chronic: Secondary | ICD-10-CM | POA: Diagnosis not present

## 2018-07-17 MED ORDER — AZITHROMYCIN 250 MG PO TABS: ORAL_TABLET | ORAL | 0 refills | Status: DC

## 2018-07-17 NOTE — Patient Instructions (Signed)
Acute Bronchitis, Adult Acute bronchitis is when air tubes (bronchi) in the lungs suddenly get swollen. The condition can make it hard to breathe. It can also cause these symptoms:  A cough.  Coughing up clear, yellow, or green mucus.  Wheezing.  Chest congestion.  Shortness of breath.  A fever.  Body aches.  Chills.  A sore throat. Follow these instructions at home:  Medicines  Take over-the-counter and prescription medicines only as told by your doctor.  If you were prescribed an antibiotic medicine, take it as told by your doctor. Do not stop taking the antibiotic even if you start to feel better. General instructions  Rest.  Drink enough fluids to keep your pee (urine) pale yellow.  Avoid smoking and secondhand smoke. If you smoke and you need help quitting, ask your doctor. Quitting will help your lungs heal faster.  Use an inhaler, cool mist vaporizer, or humidifier as told by your doctor.  Keep all follow-up visits as told by your doctor. This is important. How is this prevented? To lower your risk of getting this condition again:  Wash your hands often with soap and water. If you cannot use soap and water, use hand sanitizer.  Avoid contact with people who have cold symptoms.  Try not to touch your hands to your mouth, nose, or eyes.  Make sure to get the flu shot every year. Contact a doctor if:  Your symptoms do not get better in 2 weeks. Get help right away if:  You cough up blood.  You have chest pain.  You have very bad shortness of breath.  You become dehydrated.  You faint (pass out) or keep feeling like you are going to pass out.  You keep throwing up (vomiting).  You have a very bad headache.  Your fever or chills gets worse. This information is not intended to replace advice given to you by your health care provider. Make sure you discuss any questions you have with your health care provider. Document Released: 06/09/2007 Document  Revised: 12/03/2016 Document Reviewed: 06/11/2015 Elsevier Patient Education  2020 Elsevier Inc.  

## 2018-07-17 NOTE — Progress Notes (Signed)
Telephone visit  Subjective: HF:[id number] PCP: Terald Sleeper, PA-C EBX:IDHWYSH Ruth Terry is a 63 y.o. female calls for telephone consult today. Patient provides verbal consent for consult held via phone.  Location of patient: home Location of provider: [facility] Others present for call: none  1. Bronchitis Patient reports 3 to 4-day history of productive cough with green phlegm.  She reports associated chest congestion and right ear fullness.  She denies any fever, chills, myalgia, arthralgia, nausea, vomiting, diarrhea or hemoptysis.  No known sick contacts.  This is recurrent issue for patient and occurs at least twice per year.  She has used Flonase and Robitussin-DM and Tylenol with little improvement in symptoms thus far.  Denies any shortness of breath or wheezing.   ROS: Per HPI  Allergies  Allergen Reactions  . Chlordiazepoxide-Clidinium Itching  . Ciprofloxacin Itching    Irritates stomach  . Flagyl [Metronidazole]     Severe yeast infection  . Sulfa Drugs Cross Reactors Nausea Only  . Nitrofurantoin Monohyd Macro Rash   Past Medical History:  Diagnosis Date  . Anal fissure   . Chronic constipation   . Diverticulosis 04-20-10   colonoscopy  . Gastroparesis   . GERD (gastroesophageal reflux disease)   . Hemorrhoids   . Hyperlipidemia   . IBS (irritable bowel syndrome)   . MVP (mitral valve prolapse)   . Polyp, stomach 04-20-10   egd  . Shingles   . UTI (lower urinary tract infection)     Current Outpatient Medications:  .  AMBULATORY NON FORMULARY MEDICATION, Medication Name: Domperidone 10 mg Take 1 tablet at bedtime daily. (Patient taking differently: Take 1 tablet by mouth at bedtime. Medication Name: Domperidone 10 mg Take 1 tablet at bedtime daily.), Disp: 30 tablet, Rfl: 11 .  AMBULATORY NON FORMULARY MEDICATION, Take 1 tablet by mouth at bedtime. Medication Name: Domperidone 10mg , Disp: 90 tablet, Rfl: 2 .  amitriptyline (ELAVIL) 25 MG tablet, Take 1-2 tablets  (25-50 mg total) by mouth at bedtime., Disp: 60 tablet, Rfl: 5 .  desoximetasone (TOPICORT) 0.25 % cream, Apply 1 application topically 2 (two) times daily., Disp: 30 g, Rfl: 0 .  DEXILANT 60 MG capsule, TAKE 1 CAPSULE BY MOUTH ONCE DAILY, Disp: 30 capsule, Rfl: 5 .  diclofenac sodium (VOLTAREN) 1 % GEL, Apply 4 g topically 4 (four) times daily., Disp: 200 g, Rfl: 5 .  dicyclomine (BENTYL) 10 MG capsule, Take 1 capsule (10 mg total) by mouth every 4 (four) hours as needed for spasms., Disp: 30 capsule, Rfl: 2 .  hydrocortisone (ANUSOL-HC) 2.5 % rectal cream, Place 1 application rectally 2 (two) times daily. Please fill as Anusol HC cream, insurance preference, Disp: 30 g, Rfl: 0 .  hydrocortisone-pramoxine (PROCTOFOAM-HC) rectal foam, Place 1 applicator rectally 2 (two) times daily., Disp: 10 g, Rfl: 2 .  linaclotide (LINZESS) 145 MCG CAPS capsule, Take 1 capsule (145 mcg total) by mouth daily before breakfast. Failing TRULANCE, Disp: 30 capsule, Rfl: 5 .  Meth-Hyo-M Bl-Na Phos-Ph Sal (URIBEL) 118 MG CAPS, Take 1 capsule (118 mg total) by mouth 4 (four) times daily. (Patient taking differently: Take 1 capsule by mouth 4 (four) times daily as needed (kidneys). ), Disp: 120 capsule, Rfl: 1 .  polyethylene glycol powder (GLYCOLAX/MIRALAX) powder, Take 17 g by mouth as needed for mild constipation. , Disp: , Rfl:  .  Probiotic Product (ALIGN) 4 MG CAPS, Take 1 capsule by mouth daily., Disp: , Rfl:  .  psyllium (METAMUCIL) 58.6 % packet, Take  1 packet by mouth daily., Disp: , Rfl:  .  Simethicone (PHAZYME MAXIMUM STRENGTH) 250 MG CAPS, Take 250 mg by mouth 3 (three) times daily. (Patient taking differently: Take 250 mg by mouth 3 (three) times daily as needed (gas relief). ), Disp: 14 capsule, Rfl:  .  triamcinolone cream (KENALOG) 0.1 %, Apply 1 application topically 2 (two) times daily., Disp: 45 g, Rfl: 0 .  valACYclovir (VALTREX) 1000 MG tablet, Take 1 tablet (1,000 mg total) by mouth at bedtime., Disp:  90 tablet, Rfl: 4   GEN: sounds well. No appreciable distress. Pulm: no dyspnea with speech, she coughs intermittently during conversation  Assessment/ Plan: 63 y.o. female   1. Bronchitis Recurrent issue for patient.  We discussed proceeding with Allegra, Flonase, Robitussin-DM, hydration and humidification.  If no substantial improvement in the next few days okay to proceed with Z-Pak given that her symptoms have escalated quite quickly in the past.  At this time, I think this is unlikely to be COVID-19 given history of recurrent bronchitis.  However, if symptoms or not responding to typical therapies would have low threshold to test this patient.  Recommending proceeding with hygiene and isolation guidelines per CDC. - azithromycin (ZITHROMAX) 250 MG tablet; Take 2 tablets today, then take 1 tablet daily until gone.  Dispense: 6 tablet; Refill: 0   Start time: 12:40pm End time: 12:48pm  Total time spent on patient care (including telephone call/ virtual visit): 16 minutes  [place], [place] [phone]

## 2018-07-25 ENCOUNTER — Other Ambulatory Visit (HOSPITAL_COMMUNITY): Payer: Self-pay | Admitting: Physician Assistant

## 2018-07-25 DIAGNOSIS — Z1231 Encounter for screening mammogram for malignant neoplasm of breast: Secondary | ICD-10-CM

## 2018-08-14 ENCOUNTER — Other Ambulatory Visit: Payer: Self-pay

## 2018-08-14 ENCOUNTER — Encounter: Payer: Self-pay | Admitting: Physician Assistant

## 2018-08-14 ENCOUNTER — Ambulatory Visit (INDEPENDENT_AMBULATORY_CARE_PROVIDER_SITE_OTHER): Payer: BC Managed Care – PPO | Admitting: Physician Assistant

## 2018-08-14 VITALS — BP 146/76 | HR 94 | Temp 96.8°F | Ht 60.0 in | Wt 107.8 lb

## 2018-08-14 DIAGNOSIS — J0111 Acute recurrent frontal sinusitis: Secondary | ICD-10-CM

## 2018-08-14 DIAGNOSIS — S0992XA Unspecified injury of nose, initial encounter: Secondary | ICD-10-CM

## 2018-08-14 MED ORDER — CEFDINIR 300 MG PO CAPS: 300.0000 mg | ORAL_CAPSULE | Freq: Two times a day (BID) | ORAL | 0 refills | Status: DC

## 2018-08-15 NOTE — Progress Notes (Signed)
BP (!) 146/76   Pulse 94   Temp (!) 96.8 F (36 C) (Temporal)   Ht 5' (1.524 m)   Wt 107 lb 12.8 oz (48.9 kg)   BMI 21.05 kg/m    Subjective:    Patient ID: Ruth Terry, female    DOB: Sep 27, 1955, 63 y.o.   MRN: [id number]  HPI: Ruth Terry is a 63 y.o. female presenting on 08/14/2018 for Fall (hit nose )  Patient reports that she fell couple days ago tripping over her dog.  She hit her face on a piece of furniture.  She states that is most well and on the upper right side.  She had a slight bit of blood on the right nostril but not a profuse amount.  She does have a headache.  This patient has had many days of sinus headache and postnasal drainage. There is copious drainage at times. Denies any fever at this time. There has been a history of sinus infections in the past.  No history of sinus surgery. There is cough at night. It has become more prevalent in recent days.   Past Medical History:  Diagnosis Date  . Anal fissure   . Chronic constipation   . Diverticulosis 04-20-10   colonoscopy  . Gastroparesis   . GERD (gastroesophageal reflux disease)   . Hemorrhoids   . Hyperlipidemia   . IBS (irritable bowel syndrome)   . MVP (mitral valve prolapse)   . Polyp, stomach 04-20-10   egd  . Shingles   . UTI (lower urinary tract infection)    Relevant past medical, surgical, family and social history reviewed and updated as indicated. Interim medical history since our last visit reviewed. Allergies and medications reviewed and updated. DATA REVIEWED: CHART IN EPIC  Family History reviewed for pertinent findings.  Review of Systems  Constitutional: Negative.   HENT: Positive for congestion, facial swelling, nosebleeds and postnasal drip.   Eyes: Negative.   Respiratory: Negative.   Gastrointestinal: Negative.   Genitourinary: Negative.     Allergies as of 08/14/2018      Reactions   Chlordiazepoxide-clidinium Itching   Ciprofloxacin Itching   Irritates stomach    Flagyl [metronidazole]    Severe yeast infection   Sulfa Drugs Cross Reactors Nausea Only   Nitrofurantoin Monohyd Macro Rash      Medication List       Accurate as of August 14, 2018 11:59 PM. If you have any questions, ask your nurse or doctor.        STOP taking these medications   azithromycin 250 MG tablet Commonly known as: ZITHROMAX Stopped by: Remus Loffler, PA-C   desoximetasone 0.25 % cream Commonly known as: Topicort Stopped by: Remus Loffler, PA-C     TAKE these medications   Align 4 MG Caps Take 1 capsule by mouth daily.   AMBULATORY NON FORMULARY MEDICATION Medication Name: Domperidone 10 mg Take 1 tablet at bedtime daily. What changed:   how much to take  how to take this  when to take this   AMBULATORY NON FORMULARY MEDICATION Take 1 tablet by mouth at bedtime. Medication Name: Domperidone 10mg  What changed: Another medication with the same name was changed. Make sure you understand how and when to take each.   amitriptyline 25 MG tablet Commonly known as: ELAVIL Take 1-2 tablets (25-50 mg total) by mouth at bedtime.   cefdinir 300 MG capsule Commonly known as: OMNICEF Take 1 capsule (300 mg total) by  mouth 2 (two) times daily. Started by: Remus Loffler, PA-C   Dexilant 60 MG capsule Generic drug: dexlansoprazole TAKE 1 CAPSULE BY MOUTH ONCE DAILY   diclofenac sodium 1 % Gel Commonly known as: VOLTAREN Apply 4 g topically 4 (four) times daily.   dicyclomine 10 MG capsule Commonly known as: BENTYL Take 1 capsule (10 mg total) by mouth every 4 (four) hours as needed for spasms.   hydrocortisone 2.5 % rectal cream Commonly known as: ANUSOL-HC Place 1 application rectally 2 (two) times daily. Please fill as Anusol HC cream, insurance preference   hydrocortisone-pramoxine rectal foam Commonly known as: PROCTOFOAM-HC Place 1 applicator rectally 2 (two) times daily.   linaclotide 145 MCG Caps capsule Commonly known as: Linzess Take 1  capsule (145 mcg total) by mouth daily before breakfast. Failing TRULANCE   polyethylene glycol powder 17 GM/SCOOP powder Commonly known as: GLYCOLAX/MIRALAX Take 17 g by mouth as needed for mild constipation.   psyllium 58.6 % packet Commonly known as: METAMUCIL Take 1 packet by mouth daily.   Simethicone 250 MG Caps Commonly known as: Phazyme Maximum Strength Take 250 mg by mouth 3 (three) times daily. What changed:   when to take this  reasons to take this   triamcinolone cream 0.1 % Commonly known as: KENALOG Apply 1 application topically 2 (two) times daily.   Uribel 118 MG Caps Take 1 capsule (118 mg total) by mouth 4 (four) times daily. What changed:   when to take this  reasons to take this   valACYclovir 1000 MG tablet Commonly known as: VALTREX Take 1 tablet (1,000 mg total) by mouth at bedtime.          Objective:    BP (!) 146/76   Pulse 94   Temp (!) 96.8 F (36 C) (Temporal)   Ht 5' (1.524 m)   Wt 107 lb 12.8 oz (48.9 kg)   BMI 21.05 kg/m   Allergies  Allergen Reactions  . Chlordiazepoxide-Clidinium Itching  . Ciprofloxacin Itching    Irritates stomach  . Flagyl [Metronidazole]     Severe yeast infection  . Sulfa Drugs Cross Reactors Nausea Only  . Nitrofurantoin Monohyd Macro Rash    Wt Readings from Last 3 Encounters:  08/14/18 107 lb 12.8 oz (48.9 kg)  05/18/18 107 lb (48.5 kg)  03/01/18 109 lb (49.4 kg)    Physical Exam Vitals signs and nursing note reviewed.  Constitutional:      Appearance: She is well-developed.  HENT:     Head: Normocephalic. Abrasion and contusion present.      Right Ear: A middle ear effusion is present.     Left Ear: A middle ear effusion is present.     Nose: Mucosal edema present.     Right Sinus: No frontal sinus tenderness.     Left Sinus: No frontal sinus tenderness.     Mouth/Throat:     Pharynx: Posterior oropharyngeal erythema present. No oropharyngeal exudate.     Tonsils: No tonsillar  abscesses.  Eyes:     Conjunctiva/sclera: Conjunctivae normal.     Pupils: Pupils are equal, round, and reactive to light.  Neck:     Musculoskeletal: Normal range of motion.  Cardiovascular:     Rate and Rhythm: Normal rate and regular rhythm.     Heart sounds: Normal heart sounds.  Pulmonary:     Effort: Pulmonary effort is normal.     Breath sounds: Normal breath sounds.  Abdominal:     General:  Bowel sounds are normal.     Palpations: Abdomen is soft.  Skin:    General: Skin is warm and dry.     Findings: No rash.  Neurological:     Mental Status: She is alert and oriented to person, place, and time.     Deep Tendon Reflexes: Reflexes are normal and symmetric.  Psychiatric:        Behavior: Behavior normal.        Thought Content: Thought content normal.        Judgment: Judgment normal.     Results for orders placed or performed in visit on 05/08/18  Urine Culture   Specimen: Urine   URINE  Result Value Ref Range   Urine Culture, Routine Final report    Organism ID, Bacteria Comment   Microscopic Examination   URINE  Result Value Ref Range   WBC, UA 0-5 0 - 5 /hpf   RBC None seen 0 - 2 /hpf   Epithelial Cells (non renal) 0-10 0 - 10 /hpf   Renal Epithel, UA None seen None seen /hpf   Bacteria, UA None seen None seen/Few  Thyroid Panel With TSH  Result Value Ref Range   TSH 1.340 0.450 - 4.500 uIU/mL   T4, Total 6.9 4.5 - 12.0 ug/dL   T3 Uptake Ratio 23 (L) 24 - 39 %   Free Thyroxine Index 1.6 1.2 - 4.9  Lipid panel  Result Value Ref Range   Cholesterol, Total 209 (H) 100 - 199 mg/dL   Triglycerides 401 (H) 0 - 149 mg/dL   HDL 56 >02 mg/dL   VLDL Cholesterol Cal 46 (H) 5 - 40 mg/dL   LDL Calculated 725 (H) 0 - 99 mg/dL   Chol/HDL Ratio 3.7 0.0 - 4.4 ratio  Bayer DCA Hb A1c Waived  Result Value Ref Range   HB A1C (BAYER DCA - WAIVED) 5.5 <7.0 %  CMP14+EGFR  Result Value Ref Range   Glucose 93 65 - 99 mg/dL   BUN 7 (L) 8 - 27 mg/dL   Creatinine, Ser  3.66 0.57 - 1.00 mg/dL   GFR calc non Af Amer 94 >59 mL/min/1.73   GFR calc Af Amer 109 >59 mL/min/1.73   BUN/Creatinine Ratio 10 (L) 12 - 28   Sodium 143 134 - 144 mmol/L   Potassium 4.1 3.5 - 5.2 mmol/L   Chloride 106 96 - 106 mmol/L   CO2 20 20 - 29 mmol/L   Calcium 9.7 8.7 - 10.3 mg/dL   Total Protein 6.5 6.0 - 8.5 g/dL   Albumin 4.3 3.8 - 4.9 g/dL   Globulin, Total 2.2 1.5 - 4.5 g/dL   Albumin/Globulin Ratio 2.0 1.2 - 2.2   Bilirubin Total 0.2 0.0 - 1.2 mg/dL   Alkaline Phosphatase 72 39 - 117 IU/L   AST 13 0 - 40 IU/L   ALT 11 0 - 32 IU/L  CBC with Differential/Platelet  Result Value Ref Range   WBC 4.5 3.4 - 10.8 x10E3/uL   RBC 4.54 3.77 - 5.28 x10E6/uL   Hemoglobin 13.8 11.1 - 15.9 g/dL   Hematocrit 44.0 34.7 - 46.6 %   MCV 89 79 - 97 fL   MCH 30.4 26.6 - 33.0 pg   MCHC 34.3 31.5 - 35.7 g/dL   RDW 42.5 95.6 - 38.7 %   Platelets 227 150 - 450 x10E3/uL   Neutrophils 59 Not Estab. %   Lymphs 30 Not Estab. %   Monocytes 8 Not Estab. %  Eos 2 Not Estab. %   Basos 1 Not Estab. %   Neutrophils Absolute 2.7 1.4 - 7.0 x10E3/uL   Lymphocytes Absolute 1.4 0.7 - 3.1 x10E3/uL   Monocytes Absolute 0.4 0.1 - 0.9 x10E3/uL   EOS (ABSOLUTE) 0.1 0.0 - 0.4 x10E3/uL   Basophils Absolute 0.0 0.0 - 0.2 x10E3/uL   Immature Granulocytes 0 Not Estab. %   Immature Grans (Abs) 0.0 0.0 - 0.1 x10E3/uL  Urinalysis, Complete  Result Value Ref Range   Specific Gravity, UA 1.020 1.005 - 1.030   pH, UA 5.5 5.0 - 7.5   Color, UA Yellow Yellow   Appearance Ur Clear Clear   Leukocytes,UA Negative Negative   Protein,UA Negative Negative/Trace   Glucose, UA Negative Negative   Ketones, UA Negative Negative   RBC, UA Negative Negative   Bilirubin, UA Negative Negative   Urobilinogen, Ur 0.2 0.2 - 1.0 mg/dL   Nitrite, UA Negative Negative   Microscopic Examination See below:   Microalbumin / creatinine urine ratio  Result Value Ref Range   Creatinine, Urine 150.7 Not Estab. mg/dL    Microalbumin, Urine 6.0 Not Estab. ug/mL   Microalb/Creat Ratio 4 0 - 29 mg/g creat  ANA,IFA RA Diag Pnl w/rflx Tit/Patn  Result Value Ref Range   ANA Titer 1 Negative    Rhuematoid fact SerPl-aCnc <10.0 0.0 - 13.9 IU/mL   Cyclic Citrullin Peptide Ab 8 0 - 19 units      Assessment & Plan:   1. Injury of nose, initial encounter - DG Facial Bones 1-2 Views; Future  2. Acute recurrent frontal sinusitis - cefdinir (OMNICEF) 300 MG capsule; Take 1 capsule (300 mg total) by mouth 2 (two) times daily.  Dispense: 20 capsule; Refill: 0   Continue all other maintenance medications as listed above.  Follow up plan: No follow-ups on file.  Educational handout given for survey  Remus Loffler PA-C [facility] [address]  [place], [place] [address] [phone]   08/15/2018, 8:22 PM

## 2018-08-17 ENCOUNTER — Telehealth: Payer: Self-pay | Admitting: Physician Assistant

## 2018-08-17 ENCOUNTER — Other Ambulatory Visit: Payer: Self-pay

## 2018-08-17 ENCOUNTER — Ambulatory Visit (INDEPENDENT_AMBULATORY_CARE_PROVIDER_SITE_OTHER): Payer: BC Managed Care – PPO

## 2018-08-17 ENCOUNTER — Other Ambulatory Visit: Payer: BC Managed Care – PPO

## 2018-08-17 DIAGNOSIS — S0992XA Unspecified injury of nose, initial encounter: Secondary | ICD-10-CM | POA: Diagnosis not present

## 2018-08-17 DIAGNOSIS — S0033XA Contusion of nose, initial encounter: Secondary | ICD-10-CM | POA: Diagnosis not present

## 2018-08-17 IMAGING — DX NASAL BONES - 3+ VIEW
3 series · 3 of 3 positions shown · non-contrast
Comparison: None.

CLINICAL DATA: Swelling pain and bruising to the nose.

EXAM:
NASAL BONES - 3+ VIEW

[[person_name]]
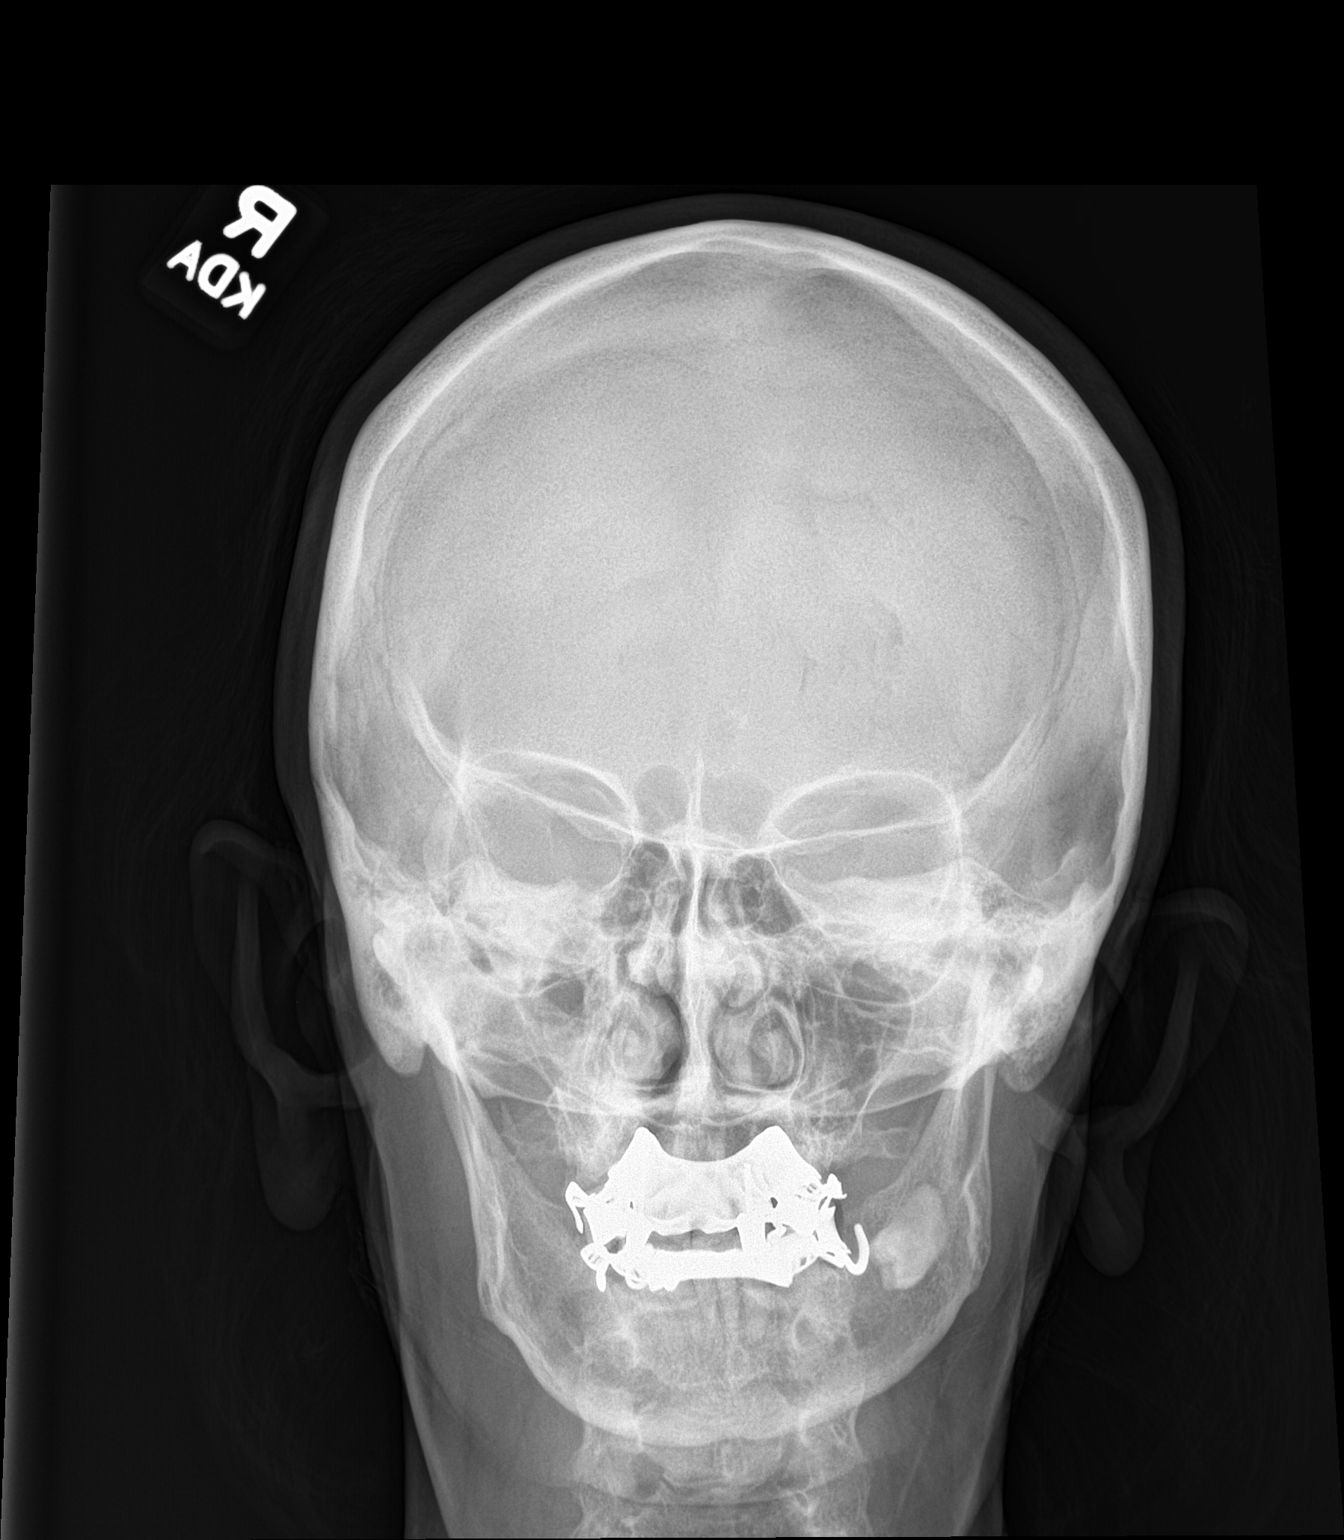

[nasal lat (1 of 2)]
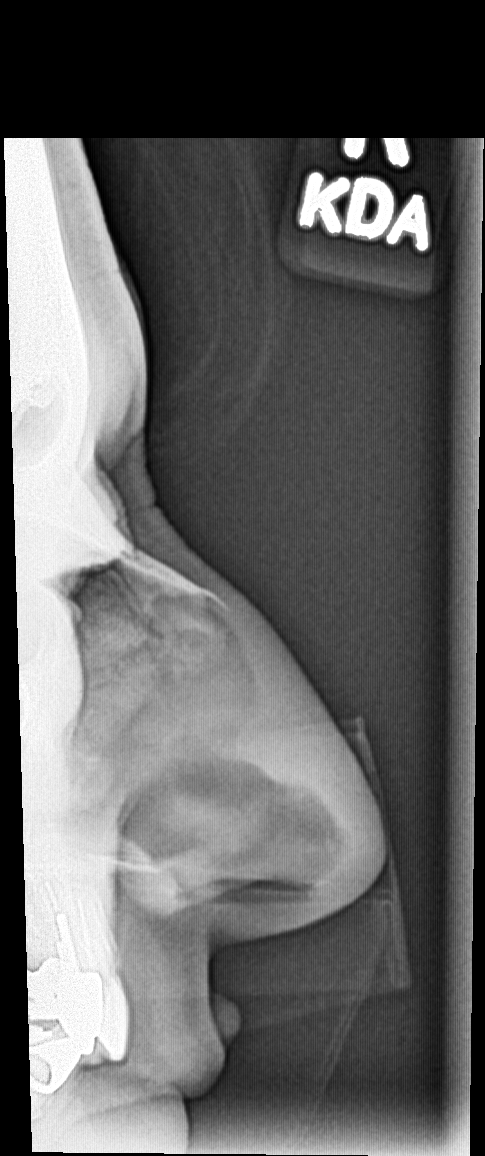

[nasal lat (2 of 2)]
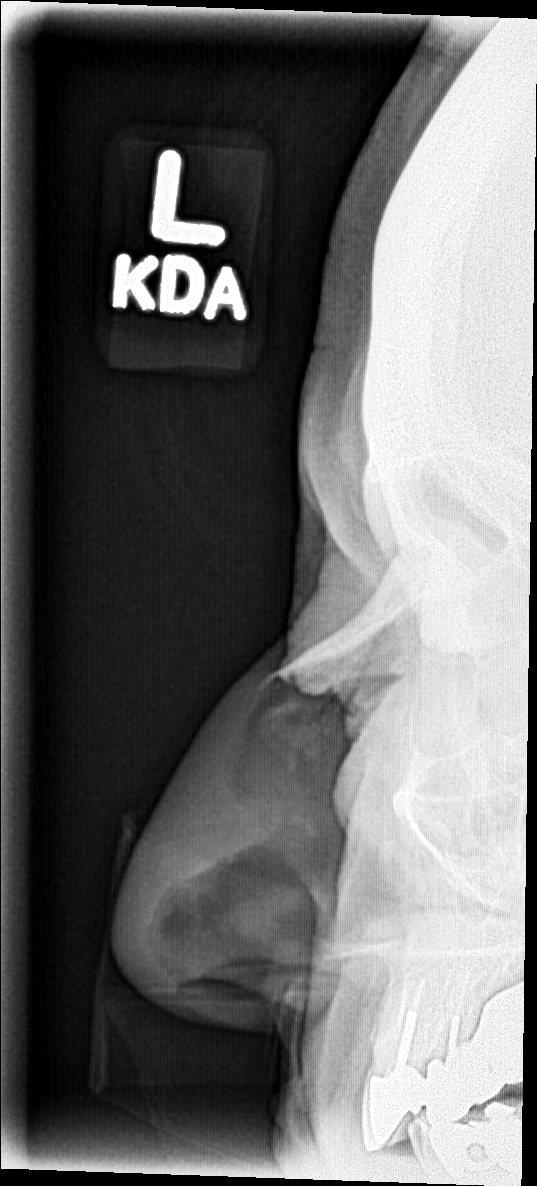

[3 of 3 positions shown; findings below may reference images not displayed]

FINDINGS: There is no evidence of fracture or other bone abnormality.
IMPRESSION: Negative.

## 2018-08-18 ENCOUNTER — Telehealth: Payer: Self-pay | Admitting: Physician Assistant

## 2018-08-18 MED ORDER — AMOXICILLIN-POT CLAVULANATE 875-125 MG PO TABS: 1.0000 | ORAL_TABLET | Freq: Two times a day (BID) | ORAL | 0 refills | Status: DC

## 2018-08-18 NOTE — Telephone Encounter (Signed)
Augmentin Prescription sent to pharmacy   

## 2018-08-18 NOTE — Telephone Encounter (Signed)
Patient aware.

## 2018-08-20 ENCOUNTER — Other Ambulatory Visit: Payer: Self-pay | Admitting: Physician Assistant

## 2018-08-20 MED ORDER — AZITHROMYCIN 250 MG PO TABS: ORAL_TABLET | ORAL | 0 refills | Status: DC

## 2018-08-20 NOTE — Telephone Encounter (Signed)
Sent to [facility]

## 2018-08-20 NOTE — Telephone Encounter (Signed)
Stop the omnicef. Will send in new antibiotic.

## 2018-08-21 NOTE — Telephone Encounter (Signed)
Pt aware of new rx

## 2018-08-31 ENCOUNTER — Ambulatory Visit (HOSPITAL_COMMUNITY): Payer: BLUE CROSS/BLUE SHIELD

## 2018-09-04 ENCOUNTER — Telehealth: Payer: Self-pay | Admitting: Physician Assistant

## 2018-09-04 NOTE — Telephone Encounter (Signed)
Spoke with pt and advised I haven't received a PA for Dexilant from pharmacy or insurance and to call pharmacy and have them fax it to me if the PA is needed. Pt voiced understanding.

## 2018-09-05 ENCOUNTER — Other Ambulatory Visit: Payer: Self-pay | Admitting: Physician Assistant

## 2018-09-06 ENCOUNTER — Encounter: Payer: Self-pay | Admitting: Family Medicine

## 2018-09-06 ENCOUNTER — Ambulatory Visit (INDEPENDENT_AMBULATORY_CARE_PROVIDER_SITE_OTHER): Payer: BC Managed Care – PPO | Admitting: Family Medicine

## 2018-09-06 DIAGNOSIS — R300 Dysuria: Secondary | ICD-10-CM

## 2018-09-06 DIAGNOSIS — R350 Frequency of micturition: Secondary | ICD-10-CM | POA: Diagnosis not present

## 2018-09-06 LAB — URINALYSIS, COMPLETE
Bilirubin, UA: NEGATIVE
Glucose, UA: NEGATIVE
Ketones, UA: NEGATIVE
Leukocytes,UA: NEGATIVE
Nitrite, UA: POSITIVE — AB
Protein,UA: NEGATIVE
RBC, UA: NEGATIVE
Specific Gravity, UA: 1.025 (ref 1.005–1.030)
Urobilinogen, Ur: 0.2 mg/dL (ref 0.2–1.0)
pH, UA: 5 (ref 5.0–7.5)

## 2018-09-06 LAB — MICROSCOPIC EXAMINATION
Epithelial Cells (non renal): >10 /hpf — AB (ref 0–10)
RBC: NONE SEEN /hpf (ref 0–2)
Renal Epithel, UA: NONE SEEN /hpf

## 2018-09-06 MED ORDER — CEPHALEXIN 500 MG PO CAPS: 500.0000 mg | ORAL_CAPSULE | Freq: Two times a day (BID) | ORAL | 0 refills | Status: AC

## 2018-09-06 NOTE — Progress Notes (Signed)
Virtual Visit via telephone Note Due to COVID-19 pandemic this visit was conducted virtually. This visit type was conducted due to national recommendations for restrictions regarding the COVID-19 Pandemic (e.g. social distancing, sheltering in place) in an effort to limit this patient's exposure and mitigate transmission in our community. All issues noted in this document were discussed and addressed.  A physical exam was not performed with this format.   I connected with Ruth Terry on 09/06/18 at 0945 by telephone and verified that I am speaking with the correct person using two identifiers. Ruth Terry is currently located at home and family is currently with them during visit. The provider, Kari Baars, FNP is located in their office at time of visit.  I discussed the limitations, risks, security and privacy concerns of performing an evaluation and management service by telephone and the availability of in person appointments. I also discussed with the patient that there may be a patient responsible charge related to this service. The patient expressed understanding and agreed to proceed.  Subjective:  Patient ID: Ruth Terry, female    DOB: June 27, 1955, 63 y.o.   MRN: [id number]  Chief Complaint:  Dysuria   HPI: Ruth Terry is a 63 y.o. female presenting on 09/06/2018 for Dysuria   Pt reports frequency, urgency, and dysuria that started 3 days ago. She reports lower abdominal and lower back pressure post voiding. Denies fever, chills, flank pain, confusion, weakness, hematuria, or decreased urination.   Dysuria  This is a new problem. The current episode started in the past 7 days. The problem occurs every urination. The problem has been gradually worsening. The quality of the pain is described as burning and aching. The pain is at a severity of 4/10. The pain is mild. There has been no fever. There is no history of pyelonephritis. Associated symptoms include frequency and  urgency. Pertinent negatives include no chills, discharge, flank pain, hematuria, hesitancy, nausea, possible pregnancy, sweats or vomiting. She has tried increased fluids (AZO) for the symptoms. The treatment provided mild relief.     Relevant past medical, surgical, family, and social history reviewed and updated as indicated.  Allergies and medications reviewed and updated.   Past Medical History:  Diagnosis Date  . Anal fissure   . Chronic constipation   . Diverticulosis 04-20-10   colonoscopy  . Gastroparesis   . GERD (gastroesophageal reflux disease)   . Hemorrhoids   . Hyperlipidemia   . IBS (irritable bowel syndrome)   . MVP (mitral valve prolapse)   . Polyp, stomach 04-20-10   egd  . Shingles   . UTI (lower urinary tract infection)     History reviewed. No pertinent surgical history.  Social History   Socioeconomic History  . Marital status: Married    Spouse name: Not on file  . Number of children: 0  . Years of education: Not on file  . Highest education level: Not on file  Occupational History  . Occupation: Interior and spatial designer    Comment: Dentist: [employer]  Social Needs  . Financial resource strain: Not on file  . Food insecurity    Worry: Not on file    Inability: Not on file  . Transportation needs    Medical: Not on file    Non-medical: Not on file  Tobacco Use  . Smoking status: Former Smoker    Packs/day: 0.50    Years: 6.00    Pack  years: 3.00    Types: Cigarettes  . Smokeless tobacco: Never Used  . Tobacco comment: USE SMOKELESS CIGARETTES  Substance and Sexual Activity  . Alcohol use: Yes    Alcohol/week: 0.0 standard drinks    Comment: 1 a week  . Drug use: No  . Sexual activity: Not on file  Lifestyle  . Physical activity    Days per week: Not on file    Minutes per session: Not on file  . Stress: Not on file  Relationships  . Social Musician on phone: Not on file    Gets  together: Not on file    Attends religious service: Not on file    Active member of club or organization: Not on file    Attends meetings of clubs or organizations: Not on file    Relationship status: Not on file  . Intimate partner violence    Fear of current or ex partner: Not on file    Emotionally abused: Not on file    Physically abused: Not on file    Forced sexual activity: Not on file  Other Topics Concern  . Not on file  Social History Narrative  . Not on file    Outpatient Encounter Medications as of 09/06/2018  Medication Sig  . AMBULATORY NON FORMULARY MEDICATION Medication Name: Domperidone 10 mg Take 1 tablet at bedtime daily. (Patient taking differently: Take 1 tablet by mouth at bedtime. Medication Name: Domperidone 10 mg Take 1 tablet at bedtime daily.)  . AMBULATORY NON FORMULARY MEDICATION Take 1 tablet by mouth at bedtime. Medication Name: Domperidone 10mg   . amitriptyline (ELAVIL) 25 MG tablet Take 1-2 tablets (25-50 mg total) by mouth at bedtime.  Marland Kitchen amoxicillin-clavulanate (AUGMENTIN) 875-125 MG tablet Take 1 tablet by mouth 2 (two) times daily.  Marland Kitchen azithromycin (ZITHROMAX Z-PAK) 250 MG tablet Take as directed for sinus infection  . cefdinir (OMNICEF) 300 MG capsule Take 1 capsule (300 mg total) by mouth 2 (two) times daily.  . cephALEXin (KEFLEX) 500 MG capsule Take 1 capsule (500 mg total) by mouth 2 (two) times daily for 5 days.  Marland Kitchen dexlansoprazole (DEXILANT) 60 MG capsule Take 1 capsule (60 mg total) by mouth daily. (Needs to be seen before next refill)  . diclofenac sodium (VOLTAREN) 1 % GEL Apply 4 g topically 4 (four) times daily.  Marland Kitchen dicyclomine (BENTYL) 10 MG capsule Take 1 capsule (10 mg total) by mouth every 4 (four) hours as needed for spasms.  . hydrocortisone (ANUSOL-HC) 2.5 % rectal cream Place 1 application rectally 2 (two) times daily. Please fill as Anusol HC cream, insurance preference  . hydrocortisone-pramoxine (PROCTOFOAM-HC) rectal foam Place 1  applicator rectally 2 (two) times daily.  Marland Kitchen linaclotide (LINZESS) 145 MCG CAPS capsule Take 1 capsule (145 mcg total) by mouth daily before breakfast. Failing TRULANCE  . Meth-Hyo-M Bl-Na Phos-Ph Sal (URIBEL) 118 MG CAPS Take 1 capsule (118 mg total) by mouth 4 (four) times daily. (Patient taking differently: Take 1 capsule by mouth 4 (four) times daily as needed (kidneys). )  . polyethylene glycol powder (GLYCOLAX/MIRALAX) powder Take 17 g by mouth as needed for mild constipation.   . Probiotic Product (ALIGN) 4 MG CAPS Take 1 capsule by mouth daily.  . psyllium (METAMUCIL) 58.6 % packet Take 1 packet by mouth daily.  . Simethicone (PHAZYME MAXIMUM STRENGTH) 250 MG CAPS Take 250 mg by mouth 3 (three) times daily. (Patient taking differently: Take 250 mg by mouth 3 (three) times  daily as needed (gas relief). )  . triamcinolone cream (KENALOG) 0.1 % Apply 1 application topically 2 (two) times daily.  . valACYclovir (VALTREX) 1000 MG tablet Take 1 tablet (1,000 mg total) by mouth at bedtime.   No facility-administered encounter medications on file as of 09/06/2018.     Allergies  Allergen Reactions  . Chlordiazepoxide-Clidinium Itching  . Ciprofloxacin Itching    Irritates stomach  . Flagyl [Metronidazole]     Severe yeast infection  . Sulfa Drugs Cross Reactors Nausea Only  . Nitrofurantoin Monohyd Macro Rash    Review of Systems  Constitutional: Negative for activity change, appetite change, chills, diaphoresis, fatigue, fever and unexpected weight change.  Respiratory: Negative for cough and shortness of breath.   Cardiovascular: Negative for chest pain, palpitations and leg swelling.  Gastrointestinal: Positive for abdominal pain (lower abdominal pressure post voiding). Negative for abdominal distention, anal bleeding, blood in stool, constipation, diarrhea, nausea, rectal pain and vomiting.  Genitourinary: Positive for dysuria, frequency and urgency. Negative for decreased urine volume,  difficulty urinating, dyspareunia, enuresis, flank pain, genital sores, hematuria, hesitancy, menstrual problem, pelvic pain, vaginal bleeding, vaginal discharge and vaginal pain.  Musculoskeletal: Negative for arthralgias and myalgias.  Skin: Negative for color change and pallor.  Neurological: Negative for weakness and headaches.  Psychiatric/Behavioral: Negative for confusion.  All other systems reviewed and are negative.        Observations/Objective: No vital signs or physical exam, this was a telephone or virtual health encounter.  Pt alert and oriented, answers all questions appropriately, and able to speak in full sentences.    Assessment and Plan: Stacee was seen today for dysuria.  Diagnoses and all orders for this visit:  Dysuria Frequency of micturition Reported symptoms consistent with acute cystitis. Pt aware to drop off a urine sample at office if able. Will order UA and culture. No red flags concerning for pyelonephritis. Pt has multiple antibiotic intolerances. Will treat with Keflex as she has tolerated in the past. Will change therapy if culture warrants. Increase water intake and avoid bladder irritants. Reevaluation in 2 weeks. Report any new or worsening symptoms.  -     cephALEXin (KEFLEX) 500 MG capsule; Take 1 capsule (500 mg total) by mouth 2 (two) times daily for 5 days. -     Urinalysis, Complete -     Urine Culture     Follow Up Instructions: Return in about 2 weeks (around 09/20/2018), or if symptoms worsen or fail to improve, for urine recheck.    I discussed the assessment and treatment plan with the patient. The patient was provided an opportunity to ask questions and all were answered. The patient agreed with the plan and demonstrated an understanding of the instructions.   The patient was advised to call back or seek an in-person evaluation if the symptoms worsen or if the condition fails to improve as anticipated.  The above assessment and  management plan was discussed with the patient. The patient verbalized understanding of and has agreed to the management plan. Patient is aware to call the clinic if symptoms persist or worsen. Patient is aware when to return to the clinic for a follow-up visit. Patient educated on when it is appropriate to go to the emergency department.    I provided 15 minutes of non-face-to-face time during this encounter. The call started at 0945. The call ended at 1000. The other time was used for coordination of care.    Kari Baars, FNP-C [facility] [address] [place], [place] [address] [phone] 09/06/18

## 2018-09-07 LAB — URINE CULTURE

## 2018-09-13 ENCOUNTER — Encounter: Payer: Self-pay | Admitting: Physician Assistant

## 2018-09-13 ENCOUNTER — Ambulatory Visit (INDEPENDENT_AMBULATORY_CARE_PROVIDER_SITE_OTHER): Payer: BC Managed Care – PPO | Admitting: Physician Assistant

## 2018-09-13 DIAGNOSIS — N3000 Acute cystitis without hematuria: Secondary | ICD-10-CM | POA: Diagnosis not present

## 2018-09-13 DIAGNOSIS — R300 Dysuria: Secondary | ICD-10-CM | POA: Diagnosis not present

## 2018-09-13 MED ORDER — CEPHALEXIN 500 MG PO CAPS: 500.0000 mg | ORAL_CAPSULE | Freq: Two times a day (BID) | ORAL | 0 refills | Status: DC

## 2018-09-13 MED ORDER — FLUCONAZOLE 150 MG PO TABS: ORAL_TABLET | ORAL | 0 refills | Status: DC

## 2018-09-13 NOTE — Progress Notes (Signed)
Telephone visit  Subjective: CC:uti recurrent PCP: Terald Sleeper, PA-C [id number] Ruth Terry is a 63 y.o. female calls for telephone consult today. Patient provides verbal consent for consult held via phone.  Patient is identified with 2 separate identifiers.  At this time the entire area is on COVID-19 social distancing and stay home orders are in place.  Patient is of higher risk and therefore we are performing this by a virtual method.  Location of patient: home Location of provider: HOME Others present for call: no  This patient has had several days of dysuria, frequency and nocturia. There is also pain over the bladder in the suprapubic region, no back pain. Denies leakage or hematuria.  Denies fever or chills. No pain in flank area.  She reports that she has seen urology in the past and may have had some difficulty with completely emptying her bladder.  I have recommended to her that if things do not get better and she keeps having these recurrent infections that urology would probably be a good place to go.   ROS: Per HPI  Allergies  Allergen Reactions  . Chlordiazepoxide-Clidinium Itching  . Ciprofloxacin Itching    Irritates stomach  . Flagyl [Metronidazole]     Severe yeast infection  . Sulfa Drugs Cross Reactors Nausea Only  . Nitrofurantoin Monohyd Macro Rash   Past Medical History:  Diagnosis Date  . Anal fissure   . Chronic constipation   . Diverticulosis 04-20-10   colonoscopy  . Gastroparesis   . GERD (gastroesophageal reflux disease)   . Hemorrhoids   . Hyperlipidemia   . IBS (irritable bowel syndrome)   . MVP (mitral valve prolapse)   . Polyp, stomach 04-20-10   egd  . Shingles   . UTI (lower urinary tract infection)     Current Outpatient Medications:  .  AMBULATORY NON FORMULARY MEDICATION, Medication Name: Domperidone 10 mg Take 1 tablet at bedtime daily. (Patient taking differently: Take 1 tablet by mouth at bedtime. Medication Name:  Domperidone 10 mg Take 1 tablet at bedtime daily.), Disp: 30 tablet, Rfl: 11 .  AMBULATORY NON FORMULARY MEDICATION, Take 1 tablet by mouth at bedtime. Medication Name: Domperidone 10mg , Disp: 90 tablet, Rfl: 2 .  amitriptyline (ELAVIL) 25 MG tablet, Take 1-2 tablets (25-50 mg total) by mouth at bedtime., Disp: 60 tablet, Rfl: 5 .  cephALEXin (KEFLEX) 500 MG capsule, Take 1 capsule (500 mg total) by mouth 2 (two) times daily., Disp: 20 capsule, Rfl: 0 .  dexlansoprazole (DEXILANT) 60 MG capsule, Take 1 capsule (60 mg total) by mouth daily. (Needs to be seen before next refill), Disp: 30 capsule, Rfl: 0 .  diclofenac sodium (VOLTAREN) 1 % GEL, Apply 4 g topically 4 (four) times daily., Disp: 200 g, Rfl: 5 .  dicyclomine (BENTYL) 10 MG capsule, Take 1 capsule (10 mg total) by mouth every 4 (four) hours as needed for spasms., Disp: 30 capsule, Rfl: 2 .  hydrocortisone (ANUSOL-HC) 2.5 % rectal cream, Place 1 application rectally 2 (two) times daily. Please fill as Anusol HC cream, insurance preference, Disp: 30 g, Rfl: 0 .  hydrocortisone-pramoxine (PROCTOFOAM-HC) rectal foam, Place 1 applicator rectally 2 (two) times daily., Disp: 10 g, Rfl: 2 .  linaclotide (LINZESS) 145 MCG CAPS capsule, Take 1 capsule (145 mcg total) by mouth daily before breakfast. Failing TRULANCE, Disp: 30 capsule, Rfl: 5 .  Meth-Hyo-M Bl-Na Phos-Ph Sal (URIBEL) 118 MG CAPS, Take 1 capsule (118 mg total) by mouth 4 (four)  times daily. (Patient taking differently: Take 1 capsule by mouth 4 (four) times daily as needed (kidneys). ), Disp: 120 capsule, Rfl: 1 .  polyethylene glycol powder (GLYCOLAX/MIRALAX) powder, Take 17 g by mouth as needed for mild constipation. , Disp: , Rfl:  .  Probiotic Product (ALIGN) 4 MG CAPS, Take 1 capsule by mouth daily., Disp: , Rfl:  .  psyllium (METAMUCIL) 58.6 % packet, Take 1 packet by mouth daily., Disp: , Rfl:  .  Simethicone (PHAZYME MAXIMUM STRENGTH) 250 MG CAPS, Take 250 mg by mouth 3 (three)  times daily. (Patient taking differently: Take 250 mg by mouth 3 (three) times daily as needed (gas relief). ), Disp: 14 capsule, Rfl:  .  triamcinolone cream (KENALOG) 0.1 %, Apply 1 application topically 2 (two) times daily., Disp: 45 g, Rfl: 0 .  valACYclovir (VALTREX) 1000 MG tablet, Take 1 tablet (1,000 mg total) by mouth at bedtime., Disp: 90 tablet, Rfl: 4  Assessment/ Plan: 63 y.o. female   1. Acute cystitis without hematuria - cephALEXin (KEFLEX) 500 MG capsule; Take 1 capsule (500 mg total) by mouth 2 (two) times daily.  Dispense: 20 capsule; Refill: 0  2. Dysuria - cephALEXin (KEFLEX) 500 MG capsule; Take 1 capsule (500 mg total) by mouth 2 (two) times daily.  Dispense: 20 capsule; Refill: 0   No follow-ups on file.  Continue all other maintenance medications as listed above.  Start time: 9:31 AM End time: 9:42 AM  Meds ordered this encounter  Medications  . cephALEXin (KEFLEX) 500 MG capsule    Sig: Take 1 capsule (500 mg total) by mouth 2 (two) times daily.    Dispense:  20 capsule    Refill:  0    Order Specific Question:   Supervising Provider    Answer:   [name] [id number]    Particia Nearing PA-C [place] [phone]

## 2018-10-09 ENCOUNTER — Other Ambulatory Visit: Payer: Self-pay | Admitting: Physician Assistant

## 2018-10-10 DIAGNOSIS — M2042 Other hammer toe(s) (acquired), left foot: Secondary | ICD-10-CM | POA: Diagnosis not present

## 2018-10-10 DIAGNOSIS — S93331D Other subluxation of right foot, subsequent encounter: Secondary | ICD-10-CM | POA: Diagnosis not present

## 2018-10-10 DIAGNOSIS — M2041 Other hammer toe(s) (acquired), right foot: Secondary | ICD-10-CM | POA: Diagnosis not present

## 2018-10-10 DIAGNOSIS — S93332D Other subluxation of left foot, subsequent encounter: Secondary | ICD-10-CM | POA: Diagnosis not present

## 2018-10-26 ENCOUNTER — Other Ambulatory Visit: Payer: Self-pay

## 2018-10-27 ENCOUNTER — Ambulatory Visit (INDEPENDENT_AMBULATORY_CARE_PROVIDER_SITE_OTHER): Payer: BC Managed Care – PPO | Admitting: Physician Assistant

## 2018-10-27 ENCOUNTER — Encounter: Payer: Self-pay | Admitting: Physician Assistant

## 2018-10-27 ENCOUNTER — Ambulatory Visit: Payer: BC Managed Care – PPO | Admitting: Physician Assistant

## 2018-10-27 VITALS — BP 122/80 | HR 94 | Temp 97.4°F | Ht 60.0 in | Wt 106.2 lb

## 2018-10-27 DIAGNOSIS — M79642 Pain in left hand: Secondary | ICD-10-CM | POA: Diagnosis not present

## 2018-10-27 DIAGNOSIS — M79641 Pain in right hand: Secondary | ICD-10-CM | POA: Diagnosis not present

## 2018-10-27 DIAGNOSIS — M2550 Pain in unspecified joint: Secondary | ICD-10-CM | POA: Diagnosis not present

## 2018-10-27 DIAGNOSIS — M25559 Pain in unspecified hip: Secondary | ICD-10-CM

## 2018-10-27 MED ORDER — PREDNISONE 10 MG (48) PO TBPK: ORAL_TABLET | ORAL | 0 refills | Status: DC

## 2018-10-27 MED ORDER — MELOXICAM 7.5 MG PO TABS: 7.5000 mg | ORAL_TABLET | Freq: Every day | ORAL | 0 refills | Status: DC

## 2018-10-27 NOTE — Patient Instructions (Signed)
Arthritis Arthritis is a term that is commonly used to refer to joint pain or joint disease. There are more than 100 types of arthritis. What are the causes? The most common cause of this condition is wear and tear of a joint. Other causes include:  Gout.  Inflammation of a joint.  An infection of a joint.  Sprains and other injuries near the joint.  A reaction to medicines or drugs, or an allergic reaction. In some cases, the cause may not be known. What are the signs or symptoms? The main symptom of this condition is pain in the joint during movement. Other symptoms include:  Redness, swelling, or stiffness at a joint.  Warmth coming from the joint.  Fever.  Overall feeling of illness. How is this diagnosed? This condition may be diagnosed with a physical exam and tests, including:  Blood tests.  Urine tests.  Imaging tests, such as X-rays, an MRI, or a CT scan. Sometimes, fluid is removed from a joint for testing. How is this treated? This condition may be treated with:  Treatment of the cause, if it is known.  Rest.  Raising (elevating) the joint.  Applying cold or hot packs to the joint.  Medicines to improve symptoms and reduce inflammation.  Injections of a steroid such as cortisone into the joint to help reduce pain and inflammation. Depending on the cause of your arthritis, you may need to make lifestyle changes to reduce stress on your joint. Changes may include:  Exercising more.  Losing weight. Follow these instructions at home: Medicines  Take over-the-counter and prescription medicines only as told by your health care provider.  Do not take aspirin to relieve pain if your health care provider thinks that gout may be causing your pain. Activity  Rest your joint if told by your health care provider. Rest is important when your disease is active and your joint feels painful, swollen, or stiff.  Avoid activities that make the pain worse. It is  important to balance activity with rest.  Exercise your joint regularly with range-of-motion exercises as told by your health care provider. Try doing low-impact exercise, such as: ? Swimming. ? Water aerobics. ? Biking. ? Walking. Managing pain, stiffness, and swelling      If directed, put ice on the joint. ? Put ice in a plastic bag. ? Place a towel between your skin and the bag. ? Leave the ice on for 20 minutes, 2-3 times per day.  If your joint is swollen, raise (elevate) it above the level of your heart if directed by your health care provider.  If your joint feels stiff in the morning, try taking a warm shower.  If directed, apply heat to the affected area as often as told by your health care provider. Use the heat source that your health care provider recommends, such as a moist heat pack or a heating pad. If you have diabetes, do not apply heat without permission from your health care provider. To apply heat: ? Place a towel between your skin and the heat source. ? Leave the heat on for 20-30 minutes. ? Remove the heat if your skin turns bright red. This is especially important if you are unable to feel pain, heat, or cold. You may have a greater risk of getting burned. General instructions  Do not use any products that contain nicotine or tobacco, such as cigarettes, e-cigarettes, and chewing tobacco. If you need help quitting, ask your health care provider.  Keep   all follow-up visits as told by your health care provider. This is important. Contact a health care provider if:  The pain gets worse.  You have a fever. Get help right away if:  You develop severe joint pain, swelling, or redness.  Many joints become painful and swollen.  You develop severe back pain.  You develop severe weakness in your leg.  You cannot control your bladder or bowels. Summary  Arthritis is a term that is commonly used to refer to joint pain or joint disease. There are more than  100 types of arthritis.  The most common cause of this condition is wear and tear of a joint. Other causes include gout, inflammation or infection of the joint, sprains, or allergies.  Symptoms of this condition include redness, swelling, or stiffness of the joint. Other symptoms include warmth, fever, or feeling ill.  This condition is treated with rest, elevation, medicines, and applying cold or hot packs.  Follow your health care provider's instructions about medicines, activity, exercises, and other home care treatments. This information is not intended to replace advice given to you by your health care provider. Make sure you discuss any questions you have with your health care provider. Document Released: 01/29/2004 Document Revised: 11/28/2017 Document Reviewed: 11/28/2017 Elsevier Patient Education  2020 Elsevier Inc.  

## 2018-10-30 NOTE — Progress Notes (Signed)
BP 122/80   Pulse 94   Temp (!) 97.4 F (36.3 C) (Temporal)   Ht 5' (1.524 m)   Wt 106 lb 3.2 oz (48.2 kg)   SpO2 98%   BMI 20.74 kg/m    Subjective:    Patient ID: Ruth Terry, female    DOB: [birth year], 63 y.o.   MRN: [id number]  HPI: Ruth Terry is a 63 y.o. female presenting on 10/27/2018 for Leg Pain, Hip Pain, and Arthritis  This patient reports multiple ongoing pain.  Is present in her hips, legs, hand.  It will be bothersome whenever she stands and works.  She does have to help take care of her mother and father.  They have been ventilated.  They live in different houses.  Her husband also has homebound and quite sick.  She will have a lot of pain.  She also has lots of documents..  All of her medications are reviewed and update her with medications.   Past Medical History:  Diagnosis Date  . Anal fissure   . Chronic constipation   . Diverticulosis 04-20-10   colonoscopy  . Gastroparesis   . GERD (gastroesophageal reflux disease)   . Hemorrhoids   . Hyperlipidemia   . IBS (irritable bowel syndrome)   . MVP (mitral valve prolapse)   . Polyp, stomach 04-20-10   egd  . Shingles   . UTI (lower urinary tract infection)    Relevant past medical, surgical, family and social history reviewed and updated as indicated. Interim medical history since our last visit reviewed. Allergies and medications reviewed and updated. DATA REVIEWED: CHART IN EPIC  Family History reviewed for pertinent findings.  Review of Systems  Constitutional: Negative.   HENT: Negative.   Eyes: Negative.   Respiratory: Negative.   Gastrointestinal: Negative.   Genitourinary: Negative.   Musculoskeletal: Positive for arthralgias, back pain, joint swelling and myalgias.    Allergies as of 10/27/2018      Reactions   Chlordiazepoxide-clidinium Itching   Ciprofloxacin Itching   Irritates stomach   Flagyl [metronidazole]    Severe yeast infection   Sulfa Drugs Cross Reactors Nausea  Only   Nitrofurantoin Monohyd Macro Rash      Medication List       Accurate as of October 27, 2018 11:59 PM. If you have any questions, ask your nurse or doctor.        STOP taking these medications   cephALEXin 500 MG capsule Commonly known as: KEFLEX Stopped by: Remus Loffler, PA-C     TAKE these medications   Align 4 MG Caps Take 1 capsule by mouth daily.   AMBULATORY NON FORMULARY MEDICATION Medication Name: Domperidone 10 mg Take 1 tablet at bedtime daily. What changed:   how much to take  how to take this  when to take this   AMBULATORY NON FORMULARY MEDICATION Take 1 tablet by mouth at bedtime. Medication Name: Domperidone 10mg  What changed: Another medication with the same name was changed. Make sure you understand how and when to take each.   amitriptyline 25 MG tablet Commonly known as: ELAVIL Take 1-2 tablets (25-50 mg total) by mouth at bedtime.   Dexilant 60 MG capsule Generic drug: dexlansoprazole TAKE 1 CAPSULE BY MOUTH ONCE DAILY . APPOINTMENT REQUIRED FOR FUTURE REFILLS   diclofenac sodium 1 % Gel Commonly known as: VOLTAREN Apply 4 g topically 4 (four) times daily.   dicyclomine 10 MG capsule Commonly known as: BENTYL Take 1 capsule (  10 mg total) by mouth every 4 (four) hours as needed for spasms.   fluconazole 150 MG tablet Commonly known as: Diflucan 1 po q week x 4 weeks   hydrocortisone 2.5 % rectal cream Commonly known as: ANUSOL-HC Place 1 application rectally 2 (two) times daily. Please fill as Anusol HC cream, insurance preference   hydrocortisone-pramoxine rectal foam Commonly known as: PROCTOFOAM-HC Place 1 applicator rectally 2 (two) times daily.   linaclotide 145 MCG Caps capsule Commonly known as: Linzess Take 1 capsule (145 mcg total) by mouth daily before breakfast. Failing TRULANCE   meloxicam 7.5 MG tablet Commonly known as: MOBIC Take 1 tablet (7.5 mg total) by mouth daily. Started by: Remus Loffler, PA-C    polyethylene glycol powder 17 GM/SCOOP powder Commonly known as: GLYCOLAX/MIRALAX Take 17 g by mouth as needed for mild constipation.   predniSONE 10 MG (48) Tbpk tablet Commonly known as: STERAPRED UNI-PAK 48 TAB Take as directed for 12 days Started by: Remus Loffler, PA-C   psyllium 58.6 % packet Commonly known as: METAMUCIL Take 1 packet by mouth daily.   Simethicone 250 MG Caps Commonly known as: Phazyme Maximum Strength Take 250 mg by mouth 3 (three) times daily. What changed:   when to take this  reasons to take this   triamcinolone cream 0.1 % Commonly known as: KENALOG Apply 1 application topically 2 (two) times daily.   Uribel 118 MG Caps Take 1 capsule (118 mg total) by mouth 4 (four) times daily. What changed:   when to take this  reasons to take this   valACYclovir 1000 MG tablet Commonly known as: VALTREX Take 1 tablet (1,000 mg total) by mouth at bedtime.          Objective:    BP 122/80   Pulse 94   Temp (!) 97.4 F (36.3 C) (Temporal)   Ht 5' (1.524 m)   Wt 106 lb 3.2 oz (48.2 kg)   SpO2 98%   BMI 20.74 kg/m   Allergies  Allergen Reactions  . Chlordiazepoxide-Clidinium Itching  . Ciprofloxacin Itching    Irritates stomach  . Flagyl [Metronidazole]     Severe yeast infection  . Sulfa Drugs Cross Reactors Nausea Only  . Nitrofurantoin Monohyd Macro Rash    Wt Readings from Last 3 Encounters:  10/27/18 106 lb 3.2 oz (48.2 kg)  08/14/18 107 lb 12.8 oz (48.9 kg)  05/18/18 107 lb (48.5 kg)    Physical Exam Constitutional:      General: She is not in acute distress.    Appearance: Normal appearance. She is well-developed.  HENT:     Head: Normocephalic and atraumatic.  Cardiovascular:     Rate and Rhythm: Normal rate.  Pulmonary:     Effort: Pulmonary effort is normal.  Musculoskeletal:     Left ankle: She exhibits decreased range of motion. She exhibits no swelling. Tenderness.     Right hand: She exhibits decreased range of  motion and deformity.     Left hand: She exhibits decreased range of motion and deformity.       Hands:       Feet:  Skin:    General: Skin is warm and dry.     Findings: No rash.  Neurological:     Mental Status: She is alert and oriented to person, place, and time.     Deep Tendon Reflexes: Reflexes are normal and symmetric.         Assessment & Plan:  1. Arthralgia, unspecified joint - predniSONE (STERAPRED UNI-PAK 48 TAB) 10 MG (48) TBPK tablet; Take as directed for 12 days  Dispense: 48 tablet; Refill: 0 - meloxicam (MOBIC) 7.5 MG tablet; Take 1 tablet (7.5 mg total) by mouth daily.  Dispense: 30 tablet; Refill: 0  2. Hip pain - predniSONE (STERAPRED UNI-PAK 48 TAB) 10 MG (48) TBPK tablet; Take as directed for 12 days  Dispense: 48 tablet; Refill: 0 - meloxicam (MOBIC) 7.5 MG tablet; Take 1 tablet (7.5 mg total) by mouth daily.  Dispense: 30 tablet; Refill: 0  3. Pain in both hands - predniSONE (STERAPRED UNI-PAK 48 TAB) 10 MG (48) TBPK tablet; Take as directed for 12 days  Dispense: 48 tablet; Refill: 0 - meloxicam (MOBIC) 7.5 MG tablet; Take 1 tablet (7.5 mg total) by mouth daily.  Dispense: 30 tablet; Refill: 0   Continue all other maintenance medications as listed above.  Follow up plan: No follow-ups on file.  Educational handout given for survey  Remus Loffler PA-C [facility] [address]  [place], [place] [address] [phone]   10/30/2018, 9:46 PM

## 2018-10-31 ENCOUNTER — Telehealth: Payer: Self-pay | Admitting: Physician Assistant

## 2018-10-31 NOTE — Telephone Encounter (Signed)
Aware of provider's advice. 

## 2018-10-31 NOTE — Telephone Encounter (Signed)
lmtcb

## 2018-10-31 NOTE — Telephone Encounter (Signed)
I do not know what the effective dose for her is going to be, she will just have to stop it if it is too unbearable.  I would give it a little more time if possible. Yes, you can take a laxative with prednisone

## 2018-10-31 NOTE — Telephone Encounter (Signed)
Patient saw you on 10/27. She was given prednisone wants to know how much she can take to effective with the inflammation, because it is causing extreme constipation. She read somewhere she could not take a laxative with her prednisone. So she is asking if she can take Miralax. Please advise.

## 2018-11-15 ENCOUNTER — Other Ambulatory Visit: Payer: Self-pay | Admitting: Physician Assistant

## 2018-11-17 ENCOUNTER — Other Ambulatory Visit: Payer: Self-pay | Admitting: Physician Assistant

## 2018-11-17 DIAGNOSIS — K5904 Chronic idiopathic constipation: Secondary | ICD-10-CM

## 2018-11-17 MED ORDER — LINACLOTIDE 145 MCG PO CAPS: 145.0000 ug | ORAL_CAPSULE | Freq: Every day | ORAL | 5 refills | Status: DC

## 2018-11-17 MED ORDER — DEXILANT 60 MG PO CPDR: DELAYED_RELEASE_CAPSULE | ORAL | 0 refills | Status: DC

## 2018-11-17 NOTE — Telephone Encounter (Signed)
Med sent in.

## 2018-11-17 NOTE — Addendum Note (Signed)
Addended by: Brynda Peon F on: 11/17/2018 10:19 AM   Modules accepted: Orders

## 2018-11-22 ENCOUNTER — Telehealth: Payer: Self-pay | Admitting: Internal Medicine

## 2018-11-22 NOTE — Telephone Encounter (Signed)
Pt states her PCP put her on prednisone for aching joints and she took 7-8 days of this and states her constipation was terrible. PCP had her taper off and now she keeps going back and forth between diarrhea and constipation. Pt also states her reflux has been acting up since the prednisone. She is having a lot of belching also and states her "whole GI tract is off", she doesn't know how to get it back on track. Pt states she has tried pepcid and gasx. Please advise.

## 2018-11-22 NOTE — Telephone Encounter (Signed)
Spoke with pt and she is aware of Dr. Blanch Media recommendations. Pt currently takes dexilant so she will add antiacid.

## 2018-11-22 NOTE — Telephone Encounter (Signed)
1.  If this is medicated related, just may take time 2.  Looks like she is taking a PPI.  If so, take antacids as needed for dyspeptic symptoms 3.  If she is not on a PPI take Prilosec OTC for 2 weeks 4.  Take Metamucil 2 tablespoons daily to help smooth out irregular bowel habits

## 2018-12-15 ENCOUNTER — Telehealth: Payer: Self-pay | Admitting: Physician Assistant

## 2018-12-15 DIAGNOSIS — K5904 Chronic idiopathic constipation: Secondary | ICD-10-CM

## 2018-12-15 DIAGNOSIS — K3184 Gastroparesis: Secondary | ICD-10-CM

## 2018-12-15 MED ORDER — DEXILANT 60 MG PO CPDR: DELAYED_RELEASE_CAPSULE | ORAL | 0 refills | Status: DC

## 2018-12-15 MED ORDER — LINACLOTIDE 145 MCG PO CAPS: 145.0000 ug | ORAL_CAPSULE | Freq: Every day | ORAL | 5 refills | Status: DC

## 2018-12-15 NOTE — Telephone Encounter (Signed)
What is the name of the medication? dexlansoprazole (DEXILANT) 60 MG capsule linaclotide (LINZESS) 145 MCG CAPS capsule    Have you contacted your pharmacy to request a refill? yes  Which pharmacy would you like this sent to? [facility] [place], she will be out by Tuesday   Patient notified that their request is being sent to the clinical staff for review and that they should receive a call once it is complete. If they do not receive a call within 24 hours they can check with their pharmacy or our office.

## 2018-12-15 NOTE — Telephone Encounter (Signed)
Meds sent in

## 2019-01-23 ENCOUNTER — Other Ambulatory Visit: Payer: Self-pay | Admitting: Physician Assistant

## 2019-01-31 ENCOUNTER — Telehealth: Payer: Self-pay | Admitting: Internal Medicine

## 2019-01-31 NOTE — Telephone Encounter (Signed)
Sure.  Add stool softener.  Increase MiraLAX to achieve desired result

## 2019-01-31 NOTE — Telephone Encounter (Signed)
Spoke with pt and she is aware.

## 2019-01-31 NOTE — Telephone Encounter (Signed)
Pt states she is having issues with lots of gas and constipation. Reports she is having gas pains/spasms up into her ribs and on her left side. She also states she is having issues processing and passing her stool. She states she takes her metamucil religiously and she has taken some miralax. Wonders if she should add a stool softener, states she just does not know what to do or take. Please advise.

## 2019-02-06 ENCOUNTER — Other Ambulatory Visit: Payer: Self-pay

## 2019-02-06 ENCOUNTER — Other Ambulatory Visit (INDEPENDENT_AMBULATORY_CARE_PROVIDER_SITE_OTHER): Payer: BC Managed Care – PPO

## 2019-02-06 DIAGNOSIS — R300 Dysuria: Secondary | ICD-10-CM | POA: Diagnosis not present

## 2019-02-06 LAB — URINALYSIS, COMPLETE
Bilirubin, UA: NEGATIVE
Glucose, UA: NEGATIVE
Ketones, UA: NEGATIVE
Leukocytes,UA: NEGATIVE
Nitrite, UA: NEGATIVE
Protein,UA: NEGATIVE
RBC, UA: NEGATIVE
Specific Gravity, UA: 1.02 (ref 1.005–1.030)
Urobilinogen, Ur: 0.2 mg/dL (ref 0.2–1.0)
pH, UA: 6 (ref 5.0–7.5)

## 2019-02-06 LAB — MICROSCOPIC EXAMINATION
Bacteria, UA: NONE SEEN
Renal Epithel, UA: NONE SEEN /hpf

## 2019-02-08 ENCOUNTER — Telehealth: Payer: Self-pay | Admitting: Physician Assistant

## 2019-02-08 LAB — URINE CULTURE

## 2019-02-08 NOTE — Telephone Encounter (Signed)
Aware labs have not been reviewed by doctor yet

## 2019-02-19 ENCOUNTER — Other Ambulatory Visit: Payer: Self-pay | Admitting: Physician Assistant

## 2019-02-19 NOTE — Telephone Encounter (Signed)
What is the name of the medication? Morenci  Have you contacted your pharmacy to request a refill? YES  Which pharmacy would you like this sent to? [facility] [place], pt has appt with PCP on 03/14/2019 needs enough to last until then   Patient notified that their request is being sent to the clinical staff for review and that they should receive a call once it is complete. If they do not receive a call within 24 hours they can check with their pharmacy or our office.

## 2019-02-26 ENCOUNTER — Other Ambulatory Visit: Payer: Self-pay

## 2019-02-27 ENCOUNTER — Ambulatory Visit: Payer: BC Managed Care – PPO | Admitting: Physician Assistant

## 2019-02-27 ENCOUNTER — Encounter: Payer: Self-pay | Admitting: Physician Assistant

## 2019-02-27 VITALS — BP 125/74 | HR 94 | Temp 98.0°F | Ht 60.0 in | Wt 103.4 lb

## 2019-02-27 DIAGNOSIS — K5904 Chronic idiopathic constipation: Secondary | ICD-10-CM

## 2019-02-27 DIAGNOSIS — M545 Low back pain, unspecified: Secondary | ICD-10-CM

## 2019-02-27 DIAGNOSIS — R300 Dysuria: Secondary | ICD-10-CM | POA: Diagnosis not present

## 2019-02-27 DIAGNOSIS — K3184 Gastroparesis: Secondary | ICD-10-CM | POA: Diagnosis not present

## 2019-02-27 DIAGNOSIS — G8929 Other chronic pain: Secondary | ICD-10-CM

## 2019-02-27 DIAGNOSIS — R5383 Other fatigue: Secondary | ICD-10-CM

## 2019-02-27 DIAGNOSIS — K589 Irritable bowel syndrome without diarrhea: Secondary | ICD-10-CM

## 2019-02-27 LAB — URINALYSIS, COMPLETE
Bilirubin, UA: NEGATIVE
Glucose, UA: NEGATIVE
Ketones, UA: NEGATIVE
Leukocytes,UA: NEGATIVE
Nitrite, UA: NEGATIVE
Protein,UA: NEGATIVE
RBC, UA: NEGATIVE
Specific Gravity, UA: 1.02 (ref 1.005–1.030)
Urobilinogen, Ur: 0.2 mg/dL (ref 0.2–1.0)
pH, UA: 6 (ref 5.0–7.5)

## 2019-02-27 LAB — MICROSCOPIC EXAMINATION
Bacteria, UA: NONE SEEN
RBC, Urine: NONE SEEN /hpf (ref 0–2)
Renal Epithel, UA: NONE SEEN /hpf

## 2019-02-27 MED ORDER — POLYETHYLENE GLYCOL 3350 17 GM/SCOOP PO POWD: 17.0000 g | Freq: Every day | ORAL | 5 refills | Status: DC

## 2019-02-28 LAB — URINE CULTURE: Organism ID, Bacteria: NO GROWTH

## 2019-02-28 NOTE — Progress Notes (Signed)
BP 125/74   Pulse 94   Temp 98 F (36.7 C) (Temporal)   Ht 5' (1.524 m)   Wt 103 lb 6.4 oz (46.9 kg)   SpO2 99%   BMI 20.19 kg/m    Subjective:    Patient ID: Ruth Terry, female    DOB: 1955-01-19, 64 y.o.   MRN: [id number]  Urinary Tract Infection  This is a new problem. The current episode started in the past 7 days. The problem has been waxing and waning. The quality of the pain is described as aching. Pertinent negatives include no hematuria, hesitancy, nausea, urgency or vomiting.  Constipation This is a chronic problem. The current episode started more than 1 year ago. The problem has been waxing and waning since onset. Her stool frequency is 4 to 5 times per week. The stool is described as firm. The patient is on a high fiber diet. She does not exercise regularly. There has been adequate water intake. Associated symptoms include abdominal pain. Pertinent negatives include no nausea or vomiting. She has tried diet changes, fiber, laxatives and stool softeners for the symptoms. The treatment provided mild relief.   1. Dysuria  2. Chronic idiopathic constipation Continue medicatione  3. Gastroparesis Continue diet and medications  4. Chronic midline low back pain without sciatica Continue medications and exercises  5. Fatigue, unspecified type labs  6. Irritable bowel syndrome, unspecified type Continue medications    Past Medical History:  Diagnosis Date  . Anal fissure   . Chronic constipation   . Diverticulosis 04-20-10   colonoscopy  . Gastroparesis   . GERD (gastroesophageal reflux disease)   . Hemorrhoids   . Hyperlipidemia   . IBS (irritable bowel syndrome)   . MVP (mitral valve prolapse)   . Polyp, stomach 04-20-10   egd  . Shingles   . UTI (lower urinary tract infection)    Relevant past medical, surgical, family and social history reviewed and updated as indicated. Interim medical history since our last visit reviewed. Allergies and  medications reviewed and updated. DATA REVIEWED: CHART IN EPIC  Family History reviewed for pertinent findings.  Review of Systems  Constitutional: Negative.   HENT: Negative.   Eyes: Negative.   Respiratory: Negative.   Gastrointestinal: Positive for abdominal pain and constipation. Negative for nausea and vomiting.  Genitourinary: Negative.  Negative for hematuria, hesitancy and urgency.    Allergies as of 02/27/2019      Reactions   Chlordiazepoxide-clidinium Itching   Ciprofloxacin Itching   Irritates stomach   Flagyl [metronidazole]    Severe yeast infection   Sulfa Drugs Cross Reactors Nausea Only   Nitrofurantoin Monohyd Macro Rash      Medication List       Accurate as of February 27, 2019 11:59 PM. If you have any questions, ask your nurse or doctor.        STOP taking these medications   predniSONE 10 MG (48) Tbpk tablet Commonly known as: STERAPRED UNI-PAK 48 TAB Stopped by: Remus Loffler, PA-C     TAKE these medications   Align 4 MG Caps Take 1 capsule by mouth daily.   AMBULATORY NON FORMULARY MEDICATION Medication Name: Domperidone 10 mg Take 1 tablet at bedtime daily. What changed:   how much to take  how to take this  when to take this   AMBULATORY NON FORMULARY MEDICATION Take 1 tablet by mouth at bedtime. Medication Name: Domperidone 10mg  What changed: Another medication with the same name was  changed. Make sure you understand how and when to take each.   amitriptyline 25 MG tablet Commonly known as: ELAVIL Take 1-2 tablets (25-50 mg total) by mouth at bedtime.   Dexilant 60 MG capsule Generic drug: dexlansoprazole TAKE 1 CAPSULE BY MOUTH ONCE DAILY . APPOINTMENT REQUIRED FOR FUTURE REFILLS   diclofenac sodium 1 % Gel Commonly known as: VOLTAREN Apply 4 g topically 4 (four) times daily.   dicyclomine 10 MG capsule Commonly known as: BENTYL Take 1 capsule (10 mg total) by mouth every 4 (four) hours as needed for spasms.     fluconazole 150 MG tablet Commonly known as: Diflucan 1 po q week x 4 weeks   hydrocortisone 2.5 % rectal cream Commonly known as: ANUSOL-HC Place 1 application rectally 2 (two) times daily. Please fill as Anusol HC cream, insurance preference   hydrocortisone-pramoxine rectal foam Commonly known as: PROCTOFOAM-HC Place 1 applicator rectally 2 (two) times daily.   linaclotide 145 MCG Caps capsule Commonly known as: Linzess Take 1 capsule (145 mcg total) by mouth daily before breakfast. Failing TRULANCE   meloxicam 7.5 MG tablet Commonly known as: MOBIC Take 1 tablet (7.5 mg total) by mouth daily.   polyethylene glycol powder 17 GM/SCOOP powder Commonly known as: GLYCOLAX/MIRALAX Take 17 g by mouth daily. What changed:   when to take this  reasons to take this Changed by: Remus Loffler, PA-C   psyllium 58.6 % packet Commonly known as: METAMUCIL Take 1 packet by mouth daily.   Simethicone 250 MG Caps Commonly known as: Phazyme Maximum Strength Take 250 mg by mouth 3 (three) times daily. What changed:   when to take this  reasons to take this   triamcinolone cream 0.1 % Commonly known as: KENALOG Apply 1 application topically 2 (two) times daily.   Uribel 118 MG Caps Take 1 capsule (118 mg total) by mouth 4 (four) times daily. What changed:   when to take this  reasons to take this   valACYclovir 1000 MG tablet Commonly known as: VALTREX Take 1 tablet (1,000 mg total) by mouth at bedtime.          Objective:    BP 125/74   Pulse 94   Temp 98 F (36.7 C) (Temporal)   Ht 5' (1.524 m)   Wt 103 lb 6.4 oz (46.9 kg)   SpO2 99%   BMI 20.19 kg/m   Allergies  Allergen Reactions  . Chlordiazepoxide-Clidinium Itching  . Ciprofloxacin Itching    Irritates stomach  . Flagyl [Metronidazole]     Severe yeast infection  . Sulfa Drugs Cross Reactors Nausea Only  . Nitrofurantoin Monohyd Macro Rash    Wt Readings from Last 3 Encounters:  02/27/19 103  lb 6.4 oz (46.9 kg)  10/27/18 106 lb 3.2 oz (48.2 kg)  08/14/18 107 lb 12.8 oz (48.9 kg)    Physical Exam Constitutional:      General: She is not in acute distress.    Appearance: Normal appearance. She is well-developed.  HENT:     Head: Normocephalic and atraumatic.  Cardiovascular:     Rate and Rhythm: Normal rate.  Pulmonary:     Effort: Pulmonary effort is normal.  Skin:    General: Skin is warm and dry.     Findings: No rash.  Neurological:     Mental Status: She is alert and oriented to person, place, and time.     Deep Tendon Reflexes: Reflexes are normal and symmetric.  Results for orders placed or performed in visit on 02/27/19  Microscopic Examination   URINE  Result Value Ref Range   WBC, UA 0-5 0 - 5 /hpf   RBC None seen 0 - 2 /hpf   Epithelial Cells (non renal) 0-10 0 - 10 /hpf   Renal Epithel, UA None seen None seen /hpf   Bacteria, UA None seen None seen/Few  Urinalysis, Complete  Result Value Ref Range   Specific Gravity, UA 1.020 1.005 - 1.030   pH, UA 6.0 5.0 - 7.5   Color, UA Yellow Yellow   Appearance Ur Clear Clear   Leukocytes,UA Negative Negative   Protein,UA Negative Negative/Trace   Glucose, UA Negative Negative   Ketones, UA Negative Negative   RBC, UA Negative Negative   Bilirubin, UA Negative Negative   Urobilinogen, Ur 0.2 0.2 - 1.0 mg/dL   Nitrite, UA Negative Negative   Microscopic Examination See below:       Assessment & Plan:   1. Dysuria - Urinalysis, Complete - Urine Culture - Microscopic Examination  2. Chronic idiopathic constipation - polyethylene glycol powder (GLYCOLAX/MIRALAX) 17 GM/SCOOP powder; Take 17 g by mouth daily.  Dispense: 255 g; Refill: 5  3. Gastroparesis  4. Chronic midline low back pain without sciatica  5. Fatigue, unspecified type - CBC with Differential/Platelet; Future - CMP14+EGFR; Future - Lipid panel; Future - TSH; Future  6. Irritable bowel syndrome, unspecified type - CBC with  Differential/Platelet; Future - CMP14+EGFR; Future - Lipid panel; Future - TSH; Future   Continue all other maintenance medications as listed above.  Follow up plan: No follow-ups on file.  Educational handout given for constipation  Remus Loffler PA-C [facility] [address]  [place], [place] [address] [phone]   02/28/2019, 5:49 PM

## 2019-03-01 ENCOUNTER — Other Ambulatory Visit: Payer: BC Managed Care – PPO

## 2019-03-01 ENCOUNTER — Other Ambulatory Visit: Payer: Self-pay

## 2019-03-01 DIAGNOSIS — K589 Irritable bowel syndrome without diarrhea: Secondary | ICD-10-CM | POA: Diagnosis not present

## 2019-03-01 DIAGNOSIS — R5383 Other fatigue: Secondary | ICD-10-CM

## 2019-03-02 LAB — LIPID PANEL
Chol/HDL Ratio: 3 ratio (ref 0.0–4.4)
Cholesterol, Total: 230 mg/dL — ABNORMAL HIGH (ref 100–199)
HDL: 76 mg/dL (ref >39)
LDL Chol Calc (NIH): 131 mg/dL — ABNORMAL HIGH (ref 0–99)
Triglycerides: 130 mg/dL (ref 0–149)
VLDL Cholesterol Cal: 23 mg/dL (ref 5–40)

## 2019-03-02 LAB — CMP14+EGFR
ALT: 9 IU/L (ref 0–32)
AST: 14 IU/L (ref 0–40)
Albumin/Globulin Ratio: 1.8 (ref 1.2–2.2)
Albumin: 4.4 g/dL (ref 3.8–4.8)
Alkaline Phosphatase: 94 IU/L (ref 39–117)
BUN/Creatinine Ratio: 14 (ref 12–28)
BUN: 11 mg/dL (ref 8–27)
Bilirubin Total: 0.3 mg/dL (ref 0.0–1.2)
CO2: 23 mmol/L (ref 20–29)
Calcium: 10.5 mg/dL — ABNORMAL HIGH (ref 8.7–10.3)
Chloride: 103 mmol/L (ref 96–106)
Creatinine, Ser: 0.76 mg/dL (ref 0.57–1.00)
GFR calc Af Amer: 98 mL/min/{1.73_m2} (ref >59)
GFR calc non Af Amer: 85 mL/min/{1.73_m2} (ref >59)
Globulin, Total: 2.4 g/dL (ref 1.5–4.5)
Glucose: 89 mg/dL (ref 65–99)
Potassium: 4.5 mmol/L (ref 3.5–5.2)
Sodium: 143 mmol/L (ref 134–144)
Total Protein: 6.8 g/dL (ref 6.0–8.5)

## 2019-03-02 LAB — CBC WITH DIFFERENTIAL/PLATELET
Basophils Absolute: 0.1 10*3/uL (ref 0.0–0.2)
Basos: 1 %
EOS (ABSOLUTE): 0.1 10*3/uL (ref 0.0–0.4)
Eos: 2 %
Hematocrit: 43 % (ref 34.0–46.6)
Hemoglobin: 15.2 g/dL (ref 11.1–15.9)
Immature Grans (Abs): 0 10*3/uL (ref 0.0–0.1)
Immature Granulocytes: 0 %
Lymphocytes Absolute: 1.4 10*3/uL (ref 0.7–3.1)
Lymphs: 29 %
MCH: 30.9 pg (ref 26.6–33.0)
MCHC: 35.3 g/dL (ref 31.5–35.7)
MCV: 87 fL (ref 79–97)
Monocytes Absolute: 0.4 10*3/uL (ref 0.1–0.9)
Monocytes: 8 %
Neutrophils Absolute: 2.9 10*3/uL (ref 1.4–7.0)
Neutrophils: 60 %
Platelets: 220 10*3/uL (ref 150–450)
RBC: 4.92 x10E6/uL (ref 3.77–5.28)
RDW: 12.8 % (ref 11.7–15.4)
WBC: 4.8 10*3/uL (ref 3.4–10.8)

## 2019-03-02 LAB — TSH: TSH: 1.65 u[IU]/mL (ref 0.450–4.500)

## 2019-03-14 ENCOUNTER — Ambulatory Visit: Payer: BC Managed Care – PPO | Admitting: Physician Assistant

## 2019-03-19 ENCOUNTER — Other Ambulatory Visit: Payer: Self-pay | Admitting: Physician Assistant

## 2019-03-22 ENCOUNTER — Telehealth: Payer: Self-pay | Admitting: Internal Medicine

## 2019-03-22 NOTE — Telephone Encounter (Signed)
Left message for pt to call back  °

## 2019-03-22 NOTE — Telephone Encounter (Signed)
Patient called has concerns with BM and she also said the Linzess requires prior auth.

## 2019-03-23 ENCOUNTER — Telehealth: Payer: Self-pay

## 2019-03-23 DIAGNOSIS — K5904 Chronic idiopathic constipation: Secondary | ICD-10-CM

## 2019-03-23 NOTE — Telephone Encounter (Signed)
I submitted this through cover my meds.  I will send her a mychart message that I am working on it

## 2019-03-23 NOTE — Telephone Encounter (Signed)
Thanks

## 2019-03-23 NOTE — Telephone Encounter (Signed)
Spoke with pt and she states she is still having issues with her constipation, hemorrhoids, and reflux. She has taken her linzess, metamucil and miralax and still having issues with constipation and this is bothering her hemorrhoids. She states the prescription HC meds burn her bottom and she is using prep h for her hemorrhoids. She is taking dexilant for her reflux in the am but still having issues with heartburn/reflux. Pt wants to know if Dr. Henrene Pastor has any other suggestions. Please advise.

## 2019-03-23 NOTE — Telephone Encounter (Signed)
Pt states her linzess has gone up to 238.00/mth now with [employer]. States she called and was given a phone number for prior auth:  [phone].

## 2019-03-23 NOTE — Telephone Encounter (Signed)
Pt scheduled to see Nicoletta Ba PA 03/30/19 at 2pm, pt aware of appt.

## 2019-03-23 NOTE — Telephone Encounter (Signed)
Hasn't been seen in 2 years. Make APP appointment. Thanks

## 2019-03-27 MED ORDER — LINACLOTIDE 145 MCG PO CAPS: 145.0000 ug | ORAL_CAPSULE | Freq: Every day | ORAL | 5 refills | Status: DC

## 2019-03-27 NOTE — Telephone Encounter (Signed)
PA approved by [facility].  Sent Linzess rx to pharmacy with approval information.  Sent patient mychart message.

## 2019-03-27 NOTE — Addendum Note (Signed)
Addended by: Audrea Muscat on: 03/27/2019 04:54 PM   Modules accepted: Orders

## 2019-03-30 ENCOUNTER — Ambulatory Visit (INDEPENDENT_AMBULATORY_CARE_PROVIDER_SITE_OTHER): Payer: BC Managed Care – PPO | Admitting: Physician Assistant

## 2019-03-30 ENCOUNTER — Encounter: Payer: Self-pay | Admitting: Physician Assistant

## 2019-03-30 VITALS — BP 110/60 | HR 104 | Temp 98.5°F | Ht 60.0 in | Wt 102.0 lb

## 2019-03-30 DIAGNOSIS — K5900 Constipation, unspecified: Secondary | ICD-10-CM | POA: Diagnosis not present

## 2019-03-30 DIAGNOSIS — K219 Gastro-esophageal reflux disease without esophagitis: Secondary | ICD-10-CM

## 2019-03-30 DIAGNOSIS — K5909 Other constipation: Secondary | ICD-10-CM

## 2019-03-30 MED ORDER — PANTOPRAZOLE SODIUM 40 MG PO TBEC: 40.0000 mg | DELAYED_RELEASE_TABLET | Freq: Every day | ORAL | 3 refills | Status: DC

## 2019-03-30 NOTE — Progress Notes (Signed)
Assessment and plan reviewed.  Significant functional overlay in play.

## 2019-03-30 NOTE — Patient Instructions (Addendum)
[name] recommends that you complete a bowel purge (to clean out your bowels). Please do the following: Purchase a bottle of Miralax over the counter as well as a box of 5 mg dulcolax tablets. Take 4 dulcolax tablets. Wait 1 hour. You will then drink 6-8 capfuls of Miralax mixed in an adequate amount of water/juice/gatorade (you may choose which of these liquids to drink) over the next 2-3 hours. You should expect results within 1 to 6 hours after completing the bowel purge.  We have sent the following medications to your pharmacy for you to pick up at your convenience:  Protonix - take before dinner for one month.  Stay on Dexilant every morning  Gas X as needed is fine.  After using the bowel purge, go back to your regular regimen of Linzess, Metamucil, and Miralax.  Gall back in one week if not better
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
## 2019-04-02 ENCOUNTER — Telehealth: Payer: Self-pay | Admitting: Physician Assistant

## 2019-04-02 NOTE — Telephone Encounter (Signed)
Patient called states she is not felling well please advise.

## 2019-04-02 NOTE — Telephone Encounter (Signed)
Patient started her purge Friday and completed purging on Saturday. She had good results. Continued emptying her bowels on Sunday. She has had "a little bowel movements today."  Calls because she feels "weak" "washed out" and is wondering if this is because of the purge. Denies feeling dizzy or having any near syncope. No nausea. She has had breakfast (eggs and toast) and some liquids.  Also has sore and "blistered" skin on her "bottom." She has triple paste on hand which has zinc oxide in it. Please advise. Push PO fluids and use the triple paste?

## 2019-04-03 NOTE — Telephone Encounter (Signed)
Yes, likely from the purge - push fluids, gatorade or pedialyte  would be good , push herself to eat , snacks etc . , and triple paste for skin irritation

## 2019-04-03 NOTE — Telephone Encounter (Signed)
Spoke with the patient. She is feeling a little better today. She will continue to hydrate.

## 2019-04-20 ENCOUNTER — Telehealth: Payer: Self-pay | Admitting: Physician Assistant

## 2019-04-20 MED ORDER — DEXILANT 60 MG PO CPDR: DELAYED_RELEASE_CAPSULE | ORAL | 0 refills | Status: DC

## 2019-04-20 NOTE — Telephone Encounter (Signed)
°  Prescription Request  04/20/2019  What is the name of the medication or equipment? Dexilant  Have you contacted your pharmacy to request a refill? (if applicable) Yes  Which pharmacy would you like this sent to? [facility], [place]  Pt called stating that she needs a refill on this Rx. Thought she had refills because she normally does but for some reason Glenard Haring didn't give her refills when she last saw her in March. Pt does have upcoming appt to see Dr Lajuana Ripple but says she is completely out of this medicine.   Patient notified that their request is being sent to the clinical staff for review and that they should receive a response within 2 business days.

## 2019-04-20 NOTE — Telephone Encounter (Signed)
Medication sent and patient aware  

## 2019-04-20 NOTE — Telephone Encounter (Signed)
Ok to approve 

## 2019-04-24 ENCOUNTER — Telehealth: Payer: Self-pay | Admitting: Physician Assistant

## 2019-04-24 ENCOUNTER — Ambulatory Visit (INDEPENDENT_AMBULATORY_CARE_PROVIDER_SITE_OTHER): Payer: BC Managed Care – PPO | Admitting: Family

## 2019-04-24 ENCOUNTER — Encounter: Payer: Self-pay | Admitting: Family

## 2019-04-24 DIAGNOSIS — K5904 Chronic idiopathic constipation: Secondary | ICD-10-CM | POA: Diagnosis not present

## 2019-04-24 DIAGNOSIS — K581 Irritable bowel syndrome with constipation: Secondary | ICD-10-CM

## 2019-04-24 DIAGNOSIS — K3184 Gastroparesis: Secondary | ICD-10-CM | POA: Diagnosis not present

## 2019-04-24 DIAGNOSIS — K219 Gastro-esophageal reflux disease without esophagitis: Secondary | ICD-10-CM

## 2019-04-24 DIAGNOSIS — Z8619 Personal history of other infectious and parasitic diseases: Secondary | ICD-10-CM

## 2019-04-24 MED ORDER — LINACLOTIDE 145 MCG PO CAPS: 145.0000 ug | ORAL_CAPSULE | Freq: Every day | ORAL | 5 refills | Status: DC

## 2019-04-24 MED ORDER — DEXILANT 60 MG PO CPDR: DELAYED_RELEASE_CAPSULE | ORAL | 0 refills | Status: DC

## 2019-04-24 MED ORDER — VALACYCLOVIR HCL 1 G PO TABS: 1000.0000 mg | ORAL_TABLET | Freq: Every day | ORAL | 4 refills | Status: DC

## 2019-04-24 NOTE — Progress Notes (Signed)
Virtual Visit via telephone Note Due to COVID-19 pandemic this visit was conducted virtually. This visit type was conducted due to national recommendations for restrictions regarding the COVID-19 Pandemic (e.g. social distancing, sheltering in place) in an effort to limit this patient's exposure and mitigate transmission in our community. All issues noted in this document were discussed and addressed.  A physical exam was not performed with this format.  I connected with Ruth Terry on 04/24/19 at 9;10 AM by telephone and verified that I am speaking with the correct person using two identifiers. Ruth Terry is currently located at home and no one is currently with her  during visit. The provider, Evelina Dun, FNP is located in their office at time of visit.  I discussed the limitations, risks, security and privacy concerns of performing an evaluation and management service by telephone and the availability of in person appointments. I also discussed with the patient that there may be a patient responsible charge related to this service. The patient expressed understanding and agreed to proceed.   History and Present Illness:  Pt calls the office today for chronic follow up. She is followed by GI annually for GERD, IBS, Gastroparesis.  Constipation This is a chronic problem. The current episode started more than 1 year ago. The problem has been resolved since onset. Stool frequency: 2-3 times a day. She has tried laxatives, diet changes and stool softeners for the symptoms. The treatment provided mild relief.  Gastroesophageal Reflux She complains of belching, heartburn and a hoarse voice. She reports no coughing. This is a chronic problem. The current episode started more than 1 year ago. The problem occurs occasionally. The problem has been waxing and waning. Risk factors include smoking/tobacco exposure. She has tried a PPI for the symptoms. The treatment provided moderate relief.  Hx  of Shingles Takes Valtrex 1000 mg at bedtime. Has not had breakout since 05/19.    Review of Systems  HENT: Positive for hoarse voice.   Respiratory: Negative for cough.   Gastrointestinal: Positive for constipation and heartburn.     Observations/Objective: No SOB or distress noted  Assessment and Plan: Ruth Terry comes in today with chief complaint of No chief complaint on file.   Diagnosis and orders addressed:  1. Gastroparesis  2. Chronic idiopathic constipation - linaclotide (LINZESS) 145 MCG CAPS capsule; Take 1 capsule (145 mcg total) by mouth daily before breakfast. Failing TRULANCE  Dispense: 30 capsule; Refill: 5  3. Gastroesophageal reflux disease without esophagitis - dexlansoprazole (DEXILANT) 60 MG capsule; 1 tablet daily  Dispense: 30 capsule; Refill: 0  4. Irritable bowel syndrome with constipation  5. History of shingles - valACYclovir (VALTREX) 1000 MG tablet; Take 1 tablet (1,000 mg total) by mouth at bedtime.  Dispense: 90 tablet; Refill: 4   Labs reviewed from 03/01/19 Health Maintenance reviewed Diet and exercise encouraged  Follow up plan: 6 months      I discussed the assessment and treatment plan with the patient. The patient was provided an opportunity to ask questions and all were answered. The patient agreed with the plan and demonstrated an understanding of the instructions.   The patient was advised to call back or seek an in-person evaluation if the symptoms worsen or if the condition fails to improve as anticipated.  The above assessment and management plan was discussed with the patient. The patient verbalized understanding of and has agreed to the management plan. Patient is aware to call the clinic if symptoms  persist or worsen. Patient is aware when to return to the clinic for a follow-up visit. Patient educated on when it is appropriate to go to the emergency department.   Time call ended:  9:27 Am   I provided 177 minutes  of non-face-to-face time during this encounter.    Evelina Dun, FNP

## 2019-04-24 NOTE — Telephone Encounter (Signed)
Patient reports she is having soft stools on Linzess and Metamucil. The issue is that the stool is difficult to pass, requires "an act of congress" and it leaves her "just drained, exhausted." The Linzess is costing her over $250 a month. She would like to explore options. Try a different medication. Not satisfied with Miralax. This "makes me dehydrated." She does not use it. If she needs to take a stool softener, she would like to use something else. Thanks

## 2019-04-25 ENCOUNTER — Other Ambulatory Visit: Payer: Self-pay

## 2019-04-25 MED ORDER — LUBIPROSTONE 24 MCG PO CAPS: 24.0000 ug | ORAL_CAPSULE | Freq: Two times a day (BID) | ORAL | 3 refills | Status: DC

## 2019-04-25 NOTE — Telephone Encounter (Signed)
Lets have her try Amitiza 24 mcg  twice daily instead of Linzess or Miiralax- I pulled 3 boxes of samples(on my desk) if she want to try before picking up RX

## 2019-04-25 NOTE — Telephone Encounter (Signed)
Spoke with the patient. She agrees to this plan. She declines the samples. She lives outside of [place]. Planning to give [place] feedback on her response to this change of medication. She will continue the Metamucil.

## 2019-04-26 ENCOUNTER — Telehealth: Payer: Self-pay | Admitting: Physician Assistant

## 2019-04-27 NOTE — Telephone Encounter (Signed)
Prior Authorization has been started through [employer].

## 2019-04-30 NOTE — Telephone Encounter (Signed)
Amitiza 24 mcg has been approved through [employer] from 04/27/19-04/25/20.

## 2019-05-01 NOTE — Telephone Encounter (Signed)
Left message on patients voicemail regarding Amitiza being approved thru April 2022.  Requested that she contact her pharmacy to have them run prescription again.

## 2019-05-08 ENCOUNTER — Telehealth: Payer: Self-pay | Admitting: Physician Assistant

## 2019-05-08 NOTE — Telephone Encounter (Signed)
Pt is requesting prescription for Domperidone 10 mg faxed over to [facility] [phone] for 100 tablets. Pls call pt when it is faxed over so she can contact the pharmacy.

## 2019-05-10 NOTE — Telephone Encounter (Signed)
Patient saw Ruth Terry in March

## 2019-05-11 NOTE — Telephone Encounter (Signed)
Patient saw Ruth Terry in March - not sure if its ok to go ahead and refill Domperidone?  Please advise.

## 2019-05-11 NOTE — Telephone Encounter (Signed)
Patient calling to follow up on refill request.

## 2019-05-14 ENCOUNTER — Telehealth: Payer: Self-pay | Admitting: Physician Assistant

## 2019-05-15 MED ORDER — AMBULATORY NON FORMULARY MEDICATION: 6 refills | Status: DC

## 2019-05-15 NOTE — Telephone Encounter (Signed)
Refill and get her follow-up WITH [name] in a month or so.  Thanks

## 2019-05-15 NOTE — Telephone Encounter (Signed)
Spoke with Julieanne Cotton, [place].

## 2019-05-15 NOTE — Telephone Encounter (Signed)
Faxed Domperidone rx to [place].  Spoke with patient and scheduled an appointment with [name] for next month.  Patient reports because of cost she stayed with the [place] 142mcg and is till having significant problems with constipation.  She states Miralax and the the stronger Linzess cause diarrhea, even with fiber.  She states she strains so hard she has hurt her back.  Please advise.

## 2019-05-16 ENCOUNTER — Ambulatory Visit (INDEPENDENT_AMBULATORY_CARE_PROVIDER_SITE_OTHER): Payer: BC Managed Care – PPO

## 2019-05-16 ENCOUNTER — Encounter: Payer: Self-pay | Admitting: Physician Assistant

## 2019-05-16 ENCOUNTER — Ambulatory Visit: Payer: BC Managed Care – PPO | Admitting: Physician Assistant

## 2019-05-16 ENCOUNTER — Other Ambulatory Visit: Payer: Self-pay

## 2019-05-16 VITALS — BP 118/79 | HR 99 | Temp 97.9°F | Ht 60.0 in | Wt 103.4 lb

## 2019-05-16 DIAGNOSIS — M546 Pain in thoracic spine: Secondary | ICD-10-CM | POA: Diagnosis not present

## 2019-05-16 IMAGING — DX DG THORACIC SPINE 2V
3 series · 3 of 3 positions shown · non-contrast
Comparison: Chest radiographs [DATE]

CLINICAL DATA: Thoracic spine pain

EXAM:
THORACIC SPINE 2 VIEWS

[t-spine ap]
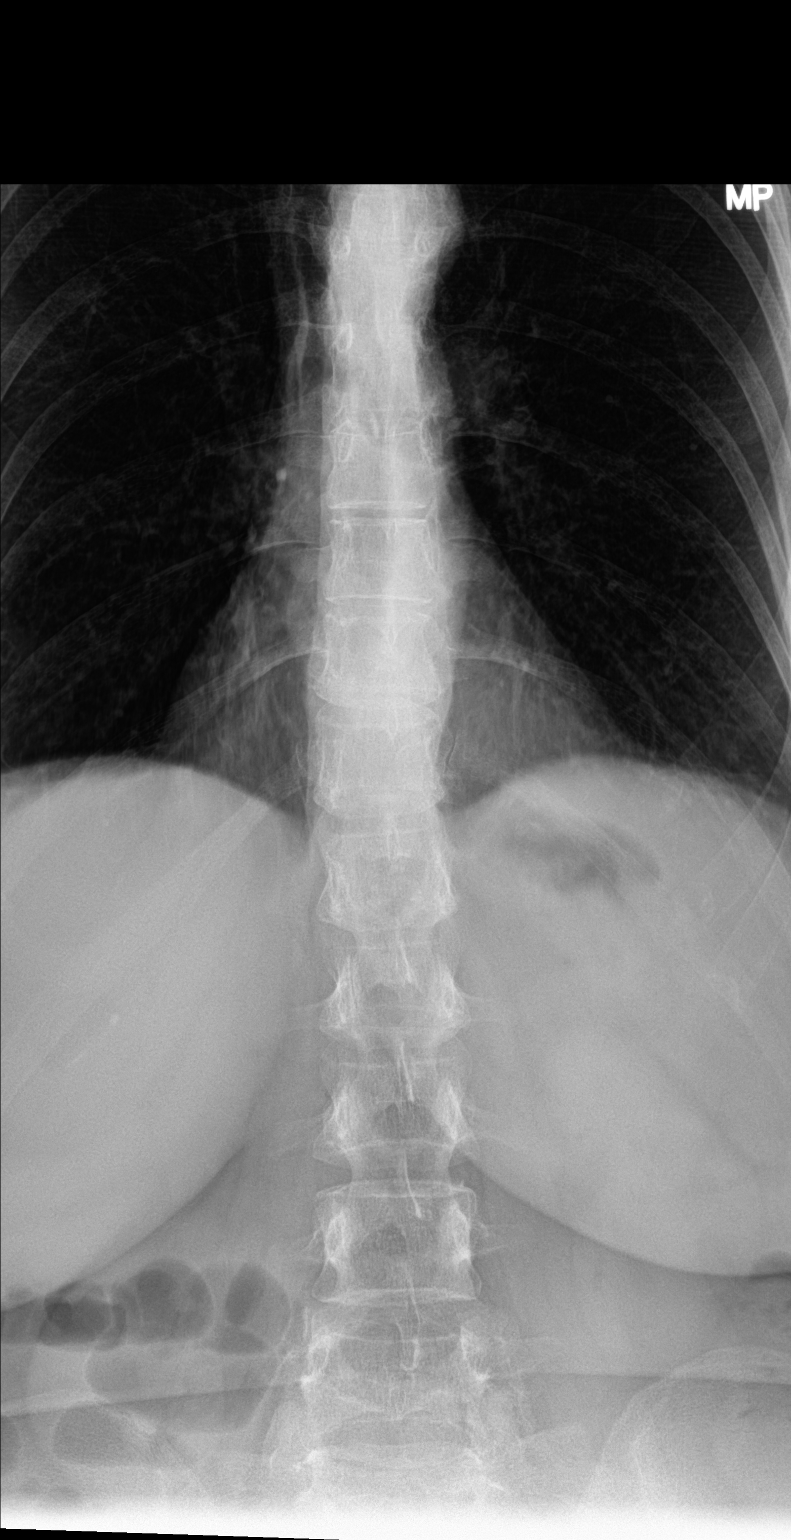

[t-spine lat]
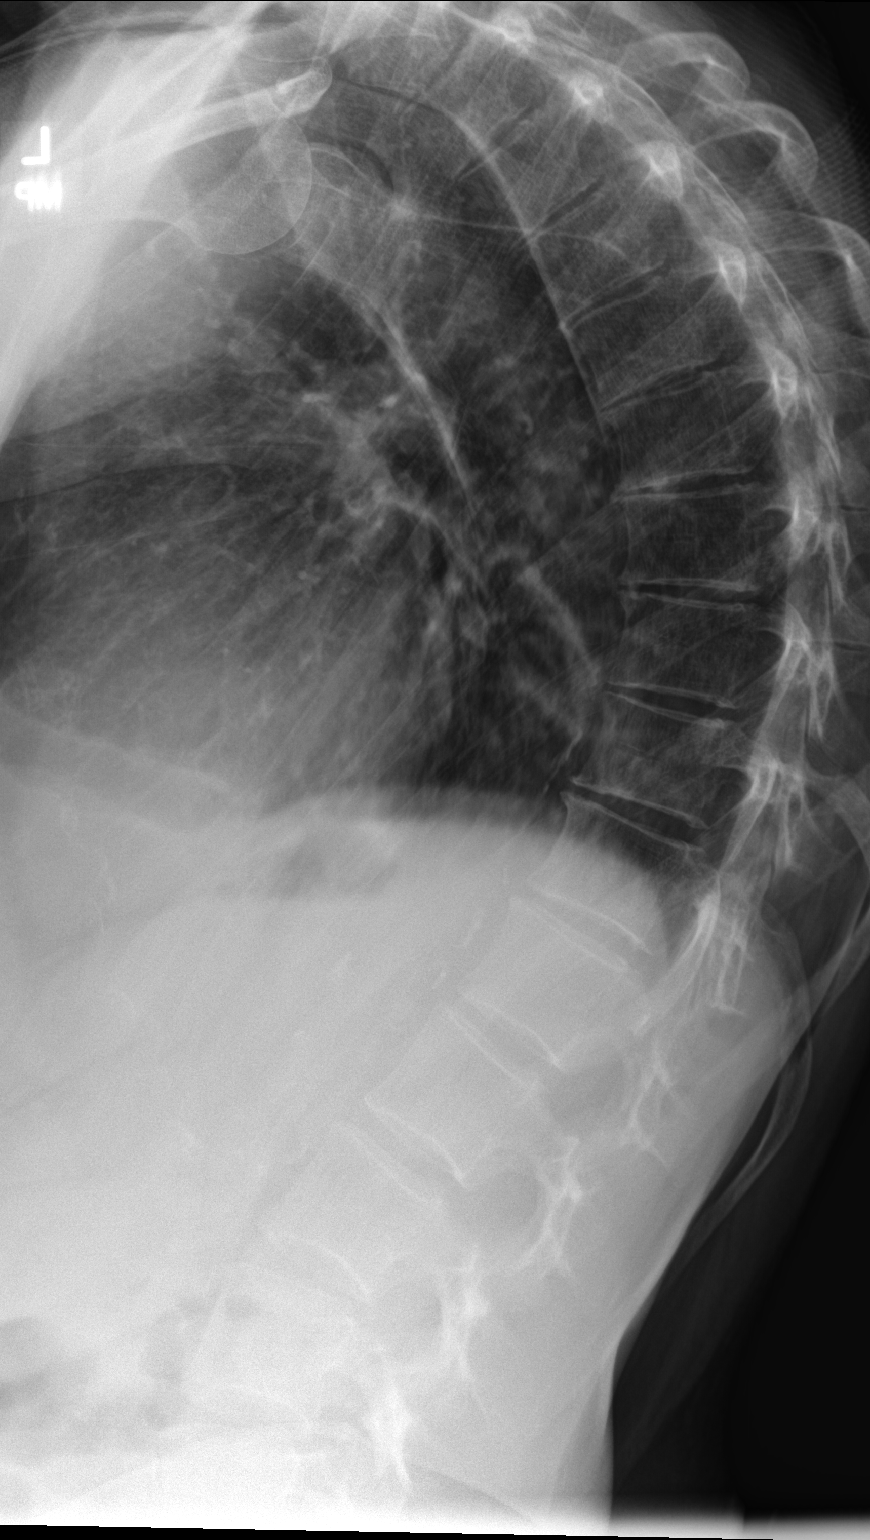

[t-spine lat swimmers]
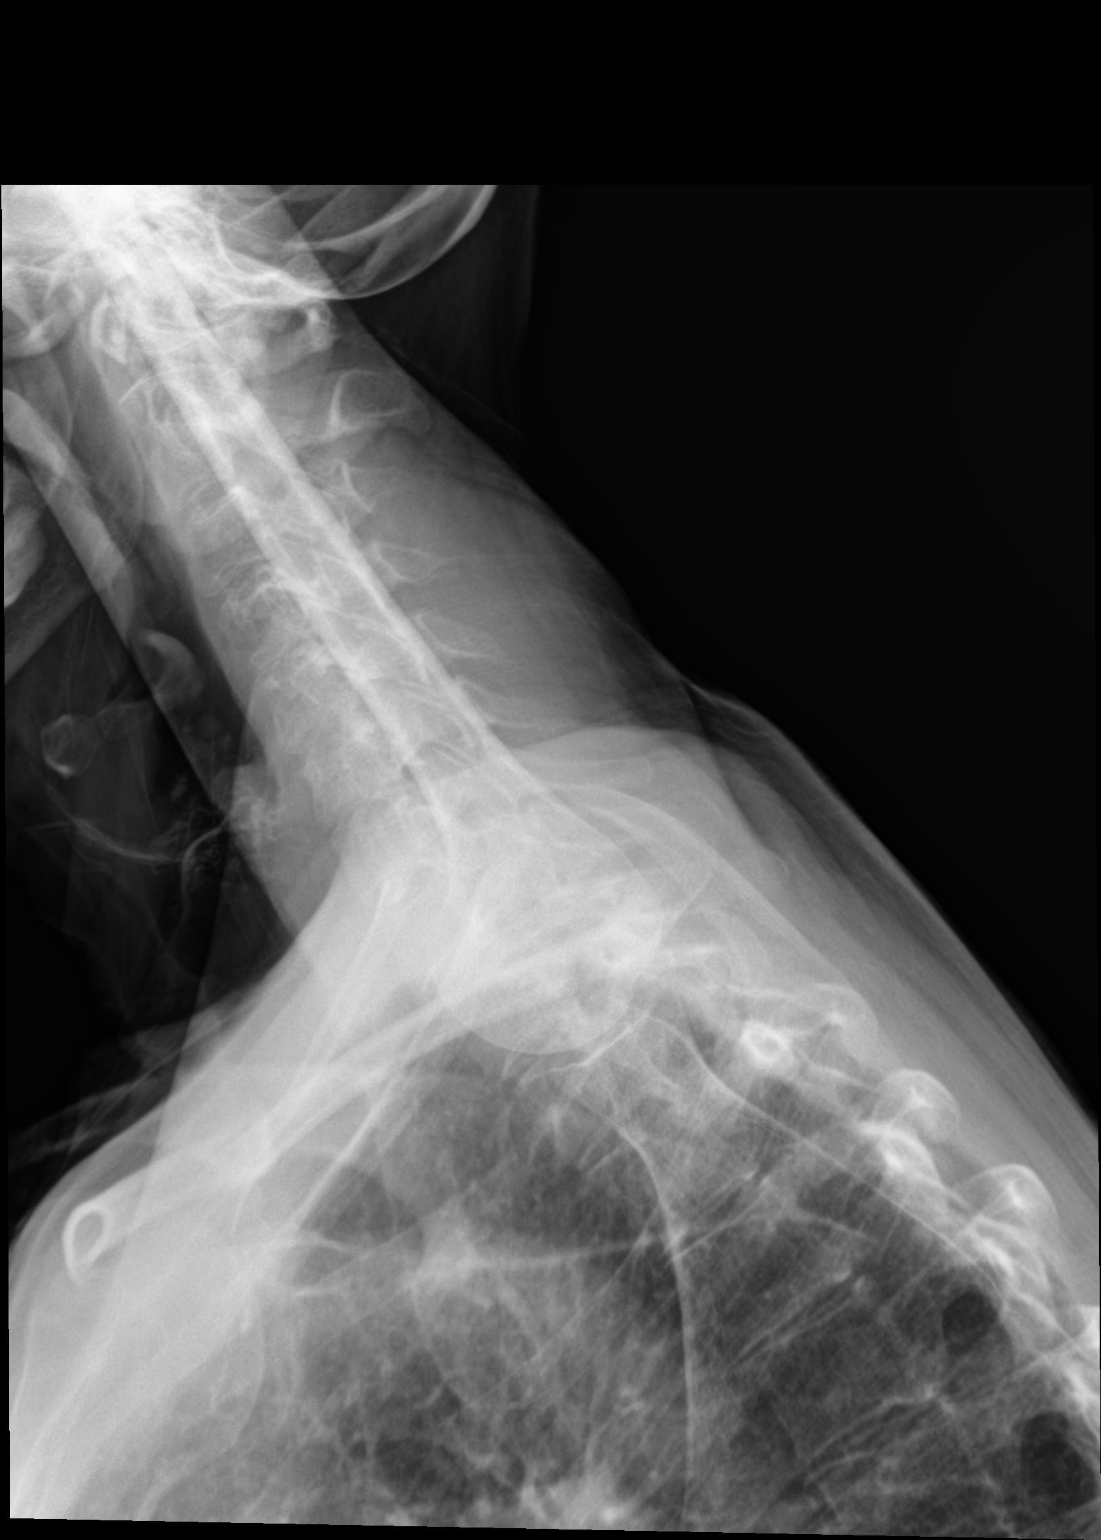

[3 of 3 positions shown; findings below may reference images not displayed]

FINDINGS: Upper thoracic vertebra excluded on AP view.

Views osseous demineralization.

Vertebral body and disc space heights maintained.

No fracture, subluxation or bone destruction.

Atherosclerotic calcifications aorta.
IMPRESSION: Osseous demineralization.

No focal thoracic spine abnormalities.

## 2019-05-16 MED ORDER — PREDNISONE 10 MG PO TABS: 20.0000 mg | ORAL_TABLET | Freq: Every day | ORAL | 0 refills | Status: DC

## 2019-05-16 NOTE — Patient Instructions (Signed)
Acute Back Pain, Adult Acute back pain is sudden and usually short-lived. It is often caused by an injury to the muscles and tissues in the back. The injury may result from:  A muscle or ligament getting overstretched or torn (strained). Ligaments are tissues that connect bones to each other. Lifting something improperly can cause a back strain.  Wear and tear (degeneration) of the spinal disks. Spinal disks are circular tissue that provides cushioning between the bones of the spine (vertebrae).  Twisting motions, such as while playing sports or doing yard work.  A hit to the back.  Arthritis. You may have a physical exam, lab tests, and imaging tests to find the cause of your pain. Acute back pain usually goes away with rest and home care. Follow these instructions at home: Managing pain, stiffness, and swelling  Take over-the-counter and prescription medicines only as told by your health care provider.  Your health care provider may recommend applying ice during the first 24-48 hours after your pain starts. To do this: ? Put ice in a plastic bag. ? Place a towel between your skin and the bag. ? Leave the ice on for 20 minutes, 2-3 times a day.  If directed, apply heat to the affected area as often as told by your health care provider. Use the heat source that your health care provider recommends, such as a moist heat pack or a heating pad. ? Place a towel between your skin and the heat source. ? Leave the heat on for 20-30 minutes. ? Remove the heat if your skin turns bright red. This is especially important if you are unable to feel pain, heat, or cold. You have a greater risk of getting burned. Activity   Do not stay in bed. Staying in bed for more than 1-2 days can delay your recovery.  Sit up and stand up straight. Avoid leaning forward when you sit, or hunching over when you stand. ? If you work at a desk, sit close to it so you do not need to lean over. Keep your chin tucked  in. Keep your neck drawn back, and keep your elbows bent at a right angle. Your arms should look like the letter "L." ? Sit high and close to the steering wheel when you drive. Add lower back (lumbar) support to your car seat, if needed.  Take short walks on even surfaces as soon as you are able. Try to increase the length of time you walk each day.  Do not sit, drive, or stand in one place for more than 30 minutes at a time. Sitting or standing for long periods of time can put stress on your back.  Do not drive or use heavy machinery while taking prescription pain medicine.  Use proper lifting techniques. When you bend and lift, use positions that put less stress on your back: ? Bend your knees. ? Keep the load close to your body. ? Avoid twisting.  Exercise regularly as told by your health care provider. Exercising helps your back heal faster and helps prevent back injuries by keeping muscles strong and flexible.  Work with a physical therapist to make a safe exercise program, as recommended by your health care provider. Do any exercises as told by your physical therapist. Lifestyle  Maintain a healthy weight. Extra weight puts stress on your back and makes it difficult to have good posture.  Avoid activities or situations that make you feel anxious or stressed. Stress and anxiety increase muscle   tension and can make back pain worse. Learn ways to manage anxiety and stress, such as through exercise. General instructions  Sleep on a firm mattress in a comfortable position. Try lying on your side with your knees slightly bent. If you lie on your back, put a pillow under your knees.  Follow your treatment plan as told by your health care provider. This may include: ? Cognitive or behavioral therapy. ? Acupuncture or massage therapy. ? Meditation or yoga. Contact a health care provider if:  You have pain that is not relieved with rest or medicine.  You have increasing pain going down  into your legs or buttocks.  Your pain does not improve after 2 weeks.  You have pain at night.  You lose weight without trying.  You have a fever or chills. Get help right away if:  You develop new bowel or bladder control problems.  You have unusual weakness or numbness in your arms or legs.  You develop nausea or vomiting.  You develop abdominal pain.  You feel faint. Summary  Acute back pain is sudden and usually short-lived.  Use proper lifting techniques. When you bend and lift, use positions that put less stress on your back.  Take over-the-counter and prescription medicines and apply heat or ice as directed by your health care provider. This information is not intended to replace advice given to you by your health care provider. Make sure you discuss any questions you have with your health care provider. Document Revised: 04/11/2018 Document Reviewed: 08/04/2016 Elsevier Patient Education  2020 Elsevier Inc.  

## 2019-05-16 NOTE — Progress Notes (Signed)
Subjective:     Patient ID: Ruth Terry, female   DOB: 13-Mar-1955, 64 y.o.   MRN: [id number]  HPI Pt with mid back pain x 10 days Some radiation of sx to the front bilat Denies any injury Pt unable to take NSAIDS and Tylenol has not helped  Review of Systems  Constitutional: Negative.   Genitourinary: Negative.   Musculoskeletal: Positive for back pain. Negative for gait problem and myalgias.       Objective:   Physical Exam Vitals and nursing note reviewed.  Constitutional:      General: She is not in acute distress.    Appearance: Normal appearance. She is normal weight. She is not toxic-appearing.  Neurological:     Mental Status: She is alert.   + kyphosis No  rash seen + TTP mid T-spine No crepitus T-spine film - see labs     Assessment:     Back pain    Plan:     Heat/Ice Topical OTC meds prn States Pred works well and she has tolerated well before Pred 20mg  q d x 1 week - SE reviewed F/U prn

## 2019-05-17 NOTE — Telephone Encounter (Signed)
Patient calling to follow up on previous message. 

## 2019-05-22 NOTE — Telephone Encounter (Signed)
Lm on vm 

## 2019-05-24 ENCOUNTER — Telehealth: Payer: Self-pay | Admitting: Physician Assistant

## 2019-05-24 NOTE — Telephone Encounter (Signed)
Doctor of the day Patient of Dr Henrene Pastor last seen by Nicoletta Ba. She has been off of Domperidone for 2 weeks. She expects delivery of the medication in a few days. She is upset because she does not feel well.  Symptoms are generalized abdominal discomfort and cramps. "Gagging" but not nauseated or vomiting. Diarrhea alternating with a feeling of constipation and a feeling of incomplete emptying of her bowels. Afebrile. Pain has been there for a least a week. She asks if she needs any testing, this be done at [facility]. She states "I cannot miss anymore work." Please advise.

## 2019-05-24 NOTE — Telephone Encounter (Signed)
These chronic symptoms and concerns should be addressed by providers who are familiar with her case.  [name] will return to the office tomorrow and can begin addressing this until Dr. Henrene Pastor returns to the office.

## 2019-05-24 NOTE — Telephone Encounter (Signed)
[name], please review the case. Ms. Mcelhenney is very concerned she has infection. Thanks

## 2019-05-25 ENCOUNTER — Other Ambulatory Visit: Payer: Self-pay

## 2019-05-25 DIAGNOSIS — R1084 Generalized abdominal pain: Secondary | ICD-10-CM

## 2019-05-25 NOTE — Telephone Encounter (Signed)
Beth, please apologize for delay - I was off this week until today - if she is still feeling bad - lets get labs - CBC, ESR, BMET, and set up for  CT adb/pelvis - it does not sound like the pain is localized- if that has changes let me know.  Could also have her purge her bowel a bit with 3-4 doses of Miralax  and see if that helps

## 2019-05-25 NOTE — Telephone Encounter (Signed)
Patient contacted. She will go to [facility] for her lab work and for the CT abd/pelvis with contrast. Go to the radiology dept and get the oral contrast with instruction for the CT when she goes to get her labs. Ms. Goode agrees to this plan of care.

## 2019-05-28 ENCOUNTER — Telehealth: Payer: Self-pay | Admitting: Physician Assistant

## 2019-05-28 NOTE — Telephone Encounter (Signed)
[facility] lab. Confirmed the orders are in Epic and that they can see the orders. Confirmed the tests to be done. Called the patient. Confirmed which lab she was planning on going to as [facility] lab. Advised her the labs were ordered and this was confirmed. Patient thanks me for the call. She has received her motility medication.

## 2019-05-29 ENCOUNTER — Other Ambulatory Visit (HOSPITAL_COMMUNITY)
Admission: RE | Admit: 2019-05-29 | Discharge: 2019-05-29 | Disposition: A | Discharge status: Home / Self Care | Payer: BC Managed Care – PPO | Source: Ambulatory Visit | Attending: Physician Assistant | Admitting: Physician Assistant

## 2019-05-29 ENCOUNTER — Other Ambulatory Visit: Payer: Self-pay

## 2019-05-29 DIAGNOSIS — R1084 Generalized abdominal pain: Secondary | ICD-10-CM | POA: Diagnosis not present

## 2019-05-29 LAB — BASIC METABOLIC PANEL
Anion gap: 11 (ref 5–15)
BUN: 9 mg/dL (ref 8–23)
CO2: 25 mmol/L (ref 22–32)
Calcium: 9.3 mg/dL (ref 8.9–10.3)
Chloride: 104 mmol/L (ref 98–111)
Creatinine, Ser: 0.61 mg/dL (ref 0.44–1.00)
GFR calc Af Amer: >60 mL/min (ref >60)
GFR calc non Af Amer: >60 mL/min (ref >60)
Glucose, Bld: 92 mg/dL (ref 70–99)
Potassium: 3.6 mmol/L (ref 3.5–5.1)
Sodium: 140 mmol/L (ref 135–145)

## 2019-05-29 LAB — CBC WITH DIFFERENTIAL/PLATELET
Abs Immature Granulocytes: 0.01 10*3/uL (ref 0.00–0.07)
Basophils Absolute: 0 10*3/uL (ref 0.0–0.1)
Basophils Relative: 1 %
Eosinophils Absolute: 0.1 10*3/uL (ref 0.0–0.5)
Eosinophils Relative: 3 %
HCT: 43.2 % (ref 36.0–46.0)
Hemoglobin: 13.9 g/dL (ref 12.0–15.0)
Immature Granulocytes: 0 %
Lymphocytes Relative: 26 %
Lymphs Abs: 1.1 10*3/uL (ref 0.7–4.0)
MCH: 30 pg (ref 26.0–34.0)
MCHC: 32.2 g/dL (ref 30.0–36.0)
MCV: 93.3 fL (ref 80.0–100.0)
Monocytes Absolute: 0.3 10*3/uL (ref 0.1–1.0)
Monocytes Relative: 8 %
Neutro Abs: 2.7 10*3/uL (ref 1.7–7.7)
Neutrophils Relative %: 62 %
Platelets: 198 10*3/uL (ref 150–400)
RBC: 4.63 MIL/uL (ref 3.87–5.11)
RDW: 13.7 % (ref 11.5–15.5)
WBC: 4.2 10*3/uL (ref 4.0–10.5)
nRBC: 0 % (ref 0.0–0.2)

## 2019-05-29 LAB — SEDIMENTATION RATE: Sed Rate: 5 mm/hr (ref 0–22)

## 2019-06-06 ENCOUNTER — Telehealth: Payer: Self-pay | Admitting: Physician Assistant

## 2019-06-06 NOTE — Telephone Encounter (Signed)
Patient saw white something white and string like in her stool. She has a new puppy. She has been complaining of abdominal cramps with diarrhea alternating with constipation. She wants to rule out parasitic infection. Asks for stool studies. Please advise

## 2019-06-07 NOTE — Telephone Encounter (Signed)
Highly unlikely.  Monitor.  She can have her puppy tested

## 2019-06-08 NOTE — Telephone Encounter (Signed)
Patient advised.

## 2019-06-12 ENCOUNTER — Ambulatory Visit (HOSPITAL_COMMUNITY)
Admission: RE | Admit: 2019-06-12 | Discharge: 2019-06-12 | Disposition: A | Discharge status: Home / Self Care | Payer: BC Managed Care – PPO | Source: Ambulatory Visit | Attending: Physician Assistant | Admitting: Physician Assistant

## 2019-06-12 ENCOUNTER — Other Ambulatory Visit: Payer: Self-pay

## 2019-06-12 DIAGNOSIS — R1084 Generalized abdominal pain: Secondary | ICD-10-CM | POA: Insufficient documentation

## 2019-06-12 DIAGNOSIS — R109 Unspecified abdominal pain: Secondary | ICD-10-CM | POA: Diagnosis not present

## 2019-06-12 IMAGING — CT CT ABD-PELV W/ CM
2 of 5 series · 16 of 46 positions shown, 18 images · IV contrast (omnipaque)
Comparison: [DATE]

CLINICAL DATA: Generalized abdominal pain and constipation for 6
weeks.

EXAM:
CT ABDOMEN AND PELVIS WITH CONTRAST
TECHNIQUE: Multidetector CT imaging of the abdomen and pelvis was performed
using the standard protocol following bolus administration of
intravenous contrast.
CONTRAST:  80mL OMNIPAQUE IOHEXOL 300 MG/ML  SOLN

[Series 2: axial st · axial · 0.66mm/px · z∈[+983,+1343]mm · 13 of 82 slices shown, 15 images]
[im 5/82  soft-tissue]
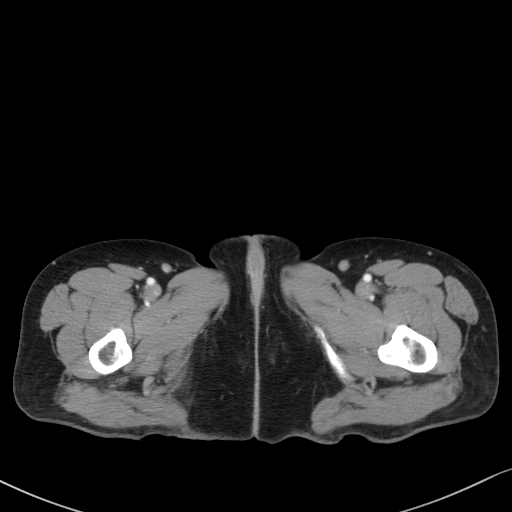
[im 5/82  bone]
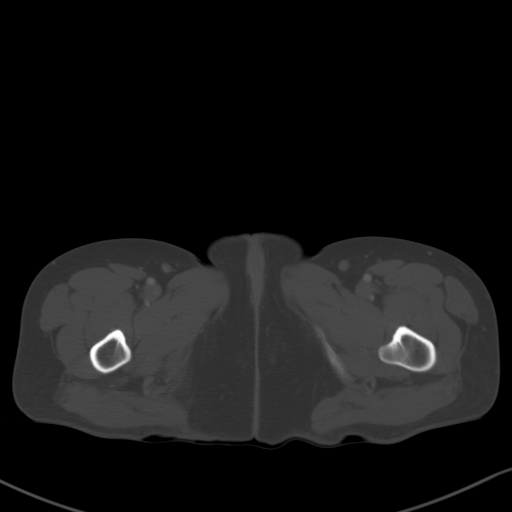
[im 10/82  soft-tissue]
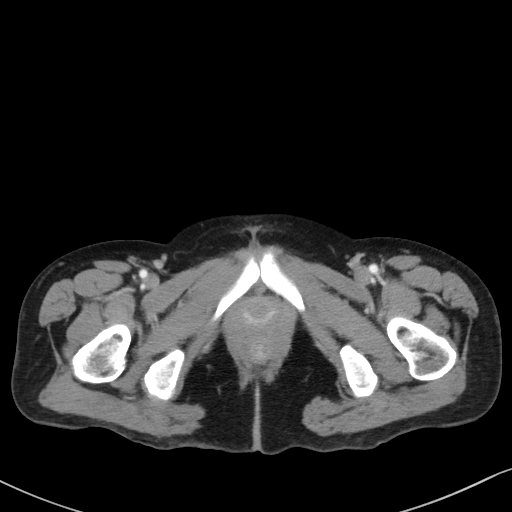
[im 19/82  soft-tissue]
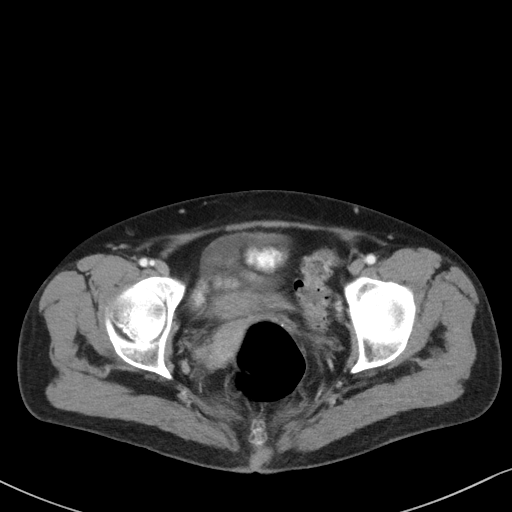
[im 23/82  soft-tissue]
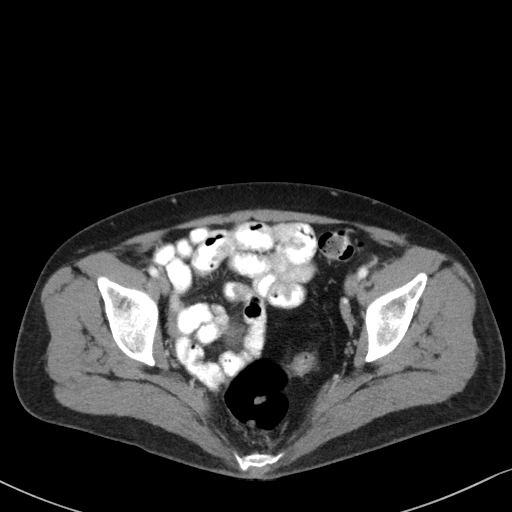
[im 28/82  soft-tissue]
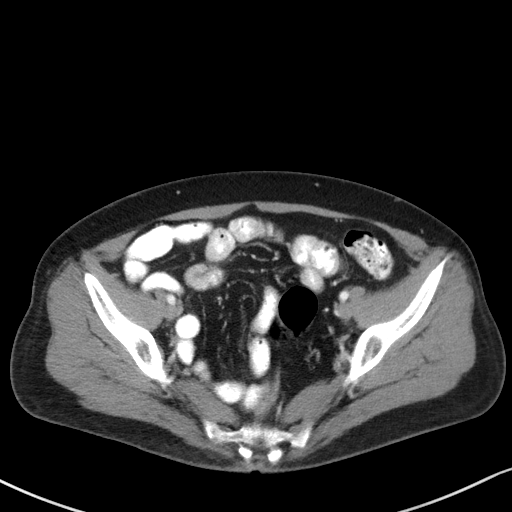
[im 37/82  soft-tissue]
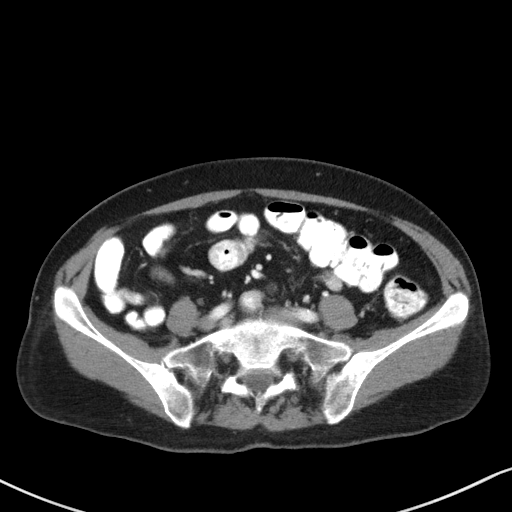
[im 41/82  soft-tissue]
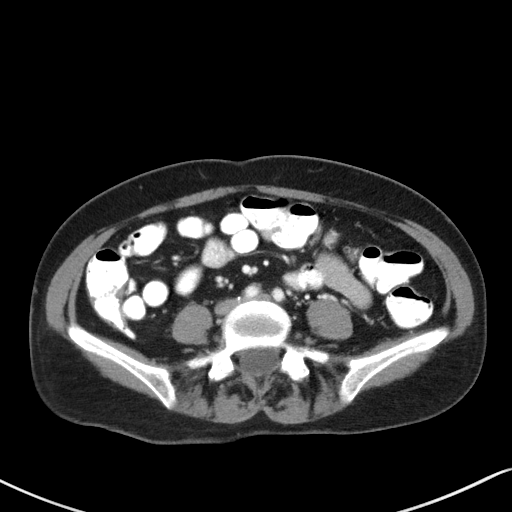
[im 46/82  soft-tissue]
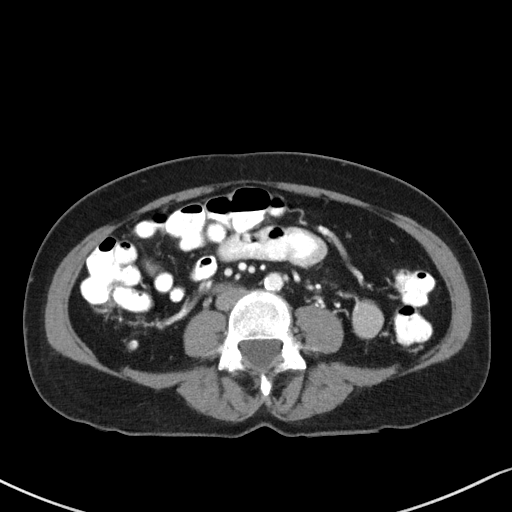
[im 55/82  soft-tissue]
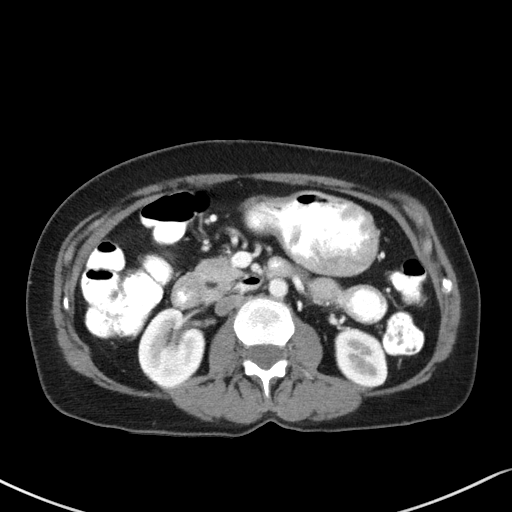
[im 55/82  bone]
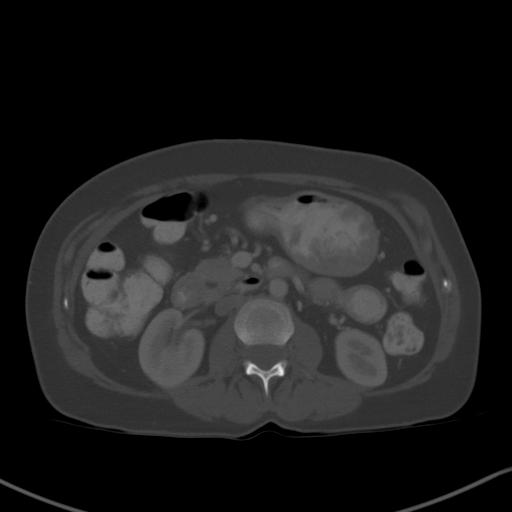
[im 59/82  soft-tissue]
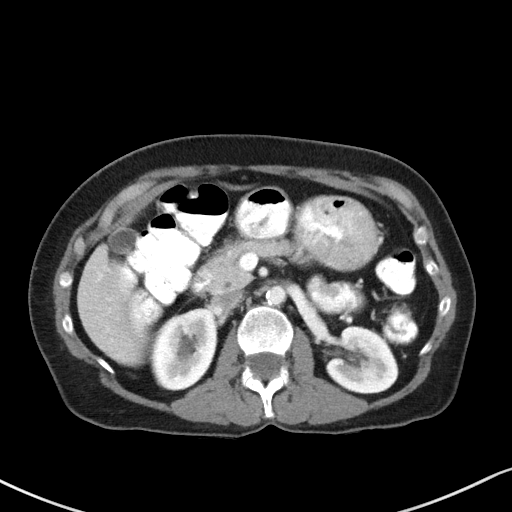
[im 64/82  soft-tissue]
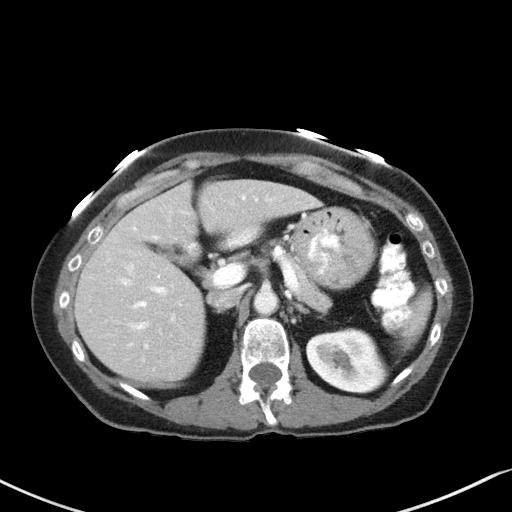
[im 73/82  soft-tissue]
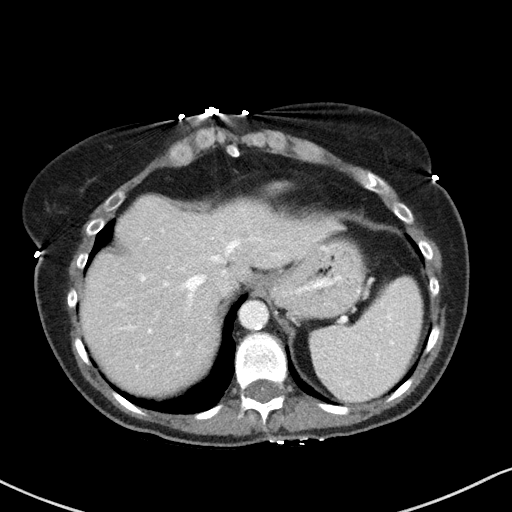
[im 77/82  soft-tissue]
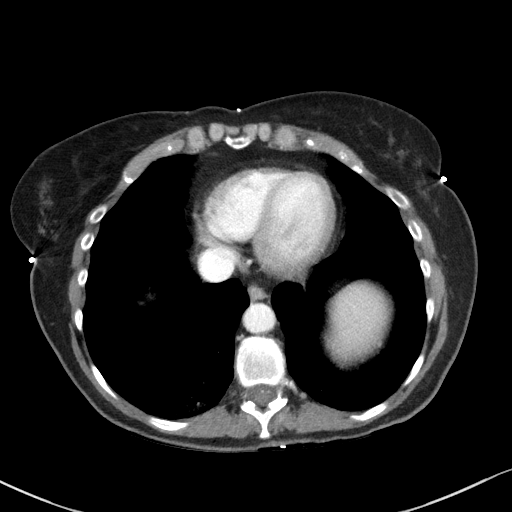

[Series 6: coronal st · coronal · 0.65mm/px · 3 of 92 slices shown]
[im 31/92  soft-tissue]
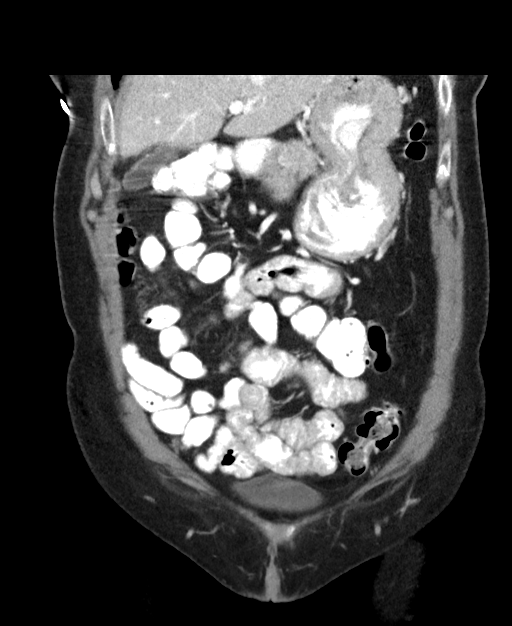
[im 41/92  soft-tissue]
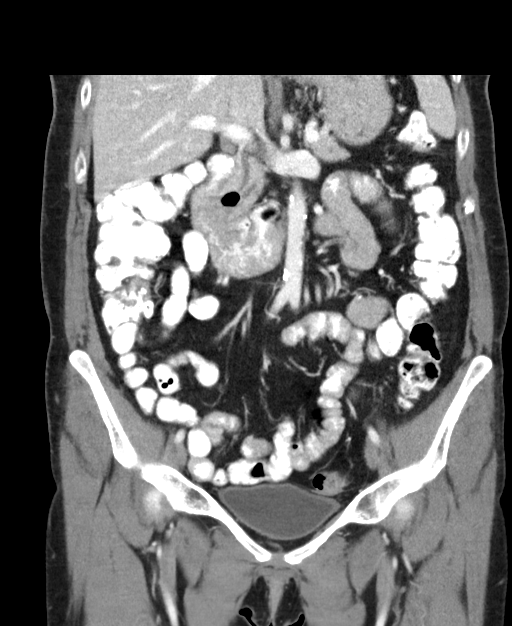
[im 51/92  soft-tissue]
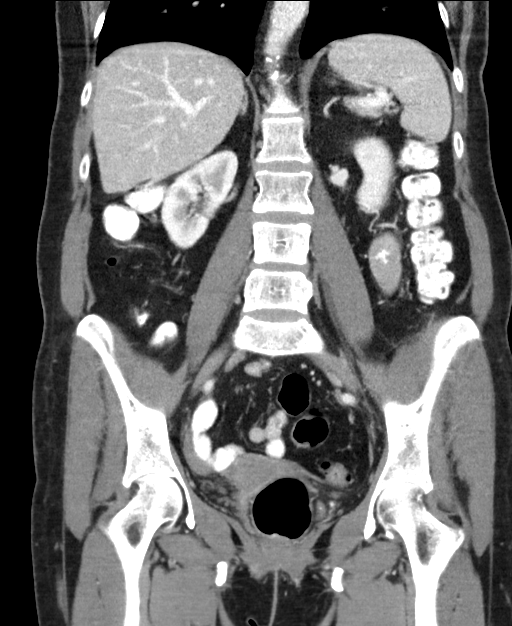

[16 of 46 positions shown; findings below may reference images not displayed]

FINDINGS: Lower Chest: No acute findings.

Hepatobiliary: No hepatic masses identified. Gallbladder is
unremarkable. No evidence of biliary ductal dilatation.

Pancreas:  No mass or inflammatory changes.

Spleen: Within normal limits in size and appearance.

Adrenals/Urinary Tract: No masses identified. No evidence of
ureteral calculi or hydronephrosis. Unremarkable unopacified urinary
bladder.

Stomach/Bowel: No evidence of obstruction, inflammatory process or
abnormal fluid collections. Normal appendix visualized.

Vascular/Lymphatic: No pathologically enlarged lymph nodes. No
abdominal aortic aneurysm. Aortic atherosclerosis noted.

Reproductive:  No mass or other significant abnormality.

Other:  None.

Musculoskeletal:  No suspicious bone lesions identified.
IMPRESSION: No acute findings or other significant abnormality within the
abdomen or pelvis.

Aortic Atherosclerosis ([2R]-[2R]).

## 2019-06-12 MED ORDER — IOHEXOL 300 MG/ML  SOLN
100.0000 mL | Freq: Once | INTRAMUSCULAR | Status: AC | PRN
  Administered 2019-06-12: 80 mL via INTRAVENOUS

## 2019-06-14 ENCOUNTER — Ambulatory Visit: Payer: BC Managed Care – PPO | Admitting: Physician Assistant

## 2019-06-18 ENCOUNTER — Other Ambulatory Visit: Payer: Self-pay | Admitting: Family

## 2019-06-18 ENCOUNTER — Telehealth: Payer: Self-pay | Admitting: Family

## 2019-06-18 DIAGNOSIS — K219 Gastro-esophageal reflux disease without esophagitis: Secondary | ICD-10-CM

## 2019-06-18 MED ORDER — LINACLOTIDE 145 MCG PO CAPS: 145.0000 ug | ORAL_CAPSULE | Freq: Every day | ORAL | 5 refills | Status: DC

## 2019-06-18 MED ORDER — DEXILANT 60 MG PO CPDR: DELAYED_RELEASE_CAPSULE | ORAL | 5 refills | Status: DC

## 2019-06-18 NOTE — Telephone Encounter (Signed)
  Prescription Request  06/18/2019  What is the name of the medication or equipment? dexlansoprazole (Terry) 60 MG capsule  linaclotide (LINZESS) 145 MCG CAPS capsule    Have you contacted your pharmacy to request a refill? (if applicable)yes  Which pharmacy would you like this sent to? [facility], pt was had televisit with Christy on 04/24/19 was told to come back in 6 months needs refills until then    Patient notified that their request is being sent to the clinical staff for review and that they should receive a response within 2 business days.

## 2019-06-18 NOTE — Telephone Encounter (Signed)
Refills sent to pharmacy. Patient aware.  

## 2019-06-21 ENCOUNTER — Other Ambulatory Visit: Payer: BC Managed Care – PPO

## 2019-06-21 ENCOUNTER — Ambulatory Visit (INDEPENDENT_AMBULATORY_CARE_PROVIDER_SITE_OTHER): Payer: BC Managed Care – PPO | Admitting: Physician Assistant

## 2019-06-21 ENCOUNTER — Encounter: Payer: Self-pay | Admitting: Physician Assistant

## 2019-06-21 VITALS — BP 110/70 | HR 77 | Ht 60.0 in | Wt 105.0 lb

## 2019-06-21 DIAGNOSIS — K581 Irritable bowel syndrome with constipation: Secondary | ICD-10-CM | POA: Diagnosis not present

## 2019-06-21 DIAGNOSIS — K219 Gastro-esophageal reflux disease without esophagitis: Secondary | ICD-10-CM

## 2019-06-21 DIAGNOSIS — K3184 Gastroparesis: Secondary | ICD-10-CM

## 2019-06-21 DIAGNOSIS — K5900 Constipation, unspecified: Secondary | ICD-10-CM | POA: Diagnosis not present

## 2019-06-21 NOTE — Patient Instructions (Addendum)
If you are age 63 or older, your body mass index should be between 23-30. Your Body mass index is 20.51 kg/m. If this is out of the aforementioned range listed, please consider follow up with your Primary Care Provider.  If you are age 46 or younger, your body mass index should be between 19-25. Your Body mass index is 20.51 kg/m. If this is out of the aformentioned range listed, please consider follow up with your Primary Care Provider.   STOP Metamucil Continue Linzess 145 mg daily Continue Dexalant 60 mg daily Take 1/2 the dose of your Miralax daily. Continue Domperidone 10 mg daily.  Start low gas diet.

## 2019-06-22 ENCOUNTER — Encounter: Payer: Self-pay | Admitting: Physician Assistant

## 2019-06-22 NOTE — Progress Notes (Signed)
Subjective:    Patient ID: Ruth Terry, female    DOB: [birth year], 64 y.o.   MRN: [id number]  HPI Ruth Terry is a pleasant 64 year old white female, established with Dr. Marina Goodell.  She was last seen in the office in March 2021 by myself.  At that time was having exacerbation of GERD with associated gas and bloating and also issues with constipation.  She has history of IBS, gastroparesis, chronic constipation and GERD. She has been on Dexilant 60 mg p.o. every morning, we added omeprazole 40 mg in the evening, she was given a bowel purge and started on MiraLAX daily to every other day.  She is maintained on domperidone 10 mg daily for gastroparesis She is also been on Linzess 145 mcg daily.  She had tried to switch to Amitiza which was too expensive and is therefore still on Linzess.  Patient says she has been under an extreme amount of stress over the past several months with both her husband and her father in and out of the hospital, mother with dementia and patient works full-time.  She says she has no time to take care of herself or to relax.  She had been off the domperidone for a couple of weeks because she ran out and says she thinks that is what flared up all of her symptoms.  She says that when she is not on the medication everything seems to sit in her abdomen and build up.  At this point she is not complaining of any significant abdominal pain.  She is having bowel movements daily but does not feel that she evacuates her bowel well and usually has to go back to the bathroom once or twice after about 30 minutes and will pass further small amount of stool.  She is frustrated by this because she says she does not have time to spend hours in the bathroom.  She says stools are not constipated but just has a difficult time with straining and passage of stool.   Review of Systems Pertinent positive and negative review of systems were noted in the above HPI section.  All other review of systems was  otherwise negative.  Outpatient Encounter Medications as of 06/21/2019  Medication Sig  . AMBULATORY NON FORMULARY MEDICATION Medication Name: Domperidone 10 mg Take 1 tablet at bedtime daily.  Marland Kitchen dexlansoprazole (DEXILANT) 60 MG capsule 1 tablet daily  . linaclotide (LINZESS) 145 MCG CAPS capsule Take 1 capsule (145 mcg total) by mouth daily before breakfast.  . Probiotic Product (ALIGN) 4 MG CAPS Take 1 capsule by mouth daily.  . psyllium (METAMUCIL) 58.6 % packet Take 1 packet by mouth daily.  . Simethicone (PHAZYME MAXIMUM STRENGTH) 250 MG CAPS Take 250 mg by mouth 3 (three) times daily. (Patient taking differently: Take 250 mg by mouth 3 (three) times daily as needed (gas relief). )  . valACYclovir (VALTREX) 1000 MG tablet Take 1 tablet (1,000 mg total) by mouth at bedtime.  . [DISCONTINUED] AMBULATORY NON FORMULARY MEDICATION Medication Name: Domperidone 10 mg Take 1 tablet at bedtime daily. (Patient taking differently: Take 1 tablet by mouth at bedtime. Medication Name: Domperidone 10 mg Take 1 tablet at bedtime daily.)  . [DISCONTINUED] predniSONE (DELTASONE) 10 MG tablet Take 2 tablets (20 mg total) by mouth daily with breakfast.   No facility-administered encounter medications on file as of 06/21/2019.   Allergies  Allergen Reactions  . Chlordiazepoxide-Clidinium Itching  . Ciprofloxacin Itching    Irritates stomach  . Flagyl [Metronidazole]  Severe yeast infection  . Sulfa Drugs Cross Reactors Nausea Only  . Nitrofurantoin Monohyd Macro Rash   Patient Active Problem List   Diagnosis Date Noted  . History of shingles 04/24/2019  . Chronic midline low back pain without sciatica 02/27/2019  . Chronic idiopathic constipation 04/26/2018  . Aortic atherosclerosis (HCC) 03/01/2017  . Rib fracture 12/07/2016  . Gas pain 03/09/2016  . Anal itching 03/09/2016  . Elevated LDL cholesterol level 10/24/2015  . Internal hemorrhoids 05/14/2014  . GERD (gastroesophageal reflux  disease) 09/17/2011  . IBS (irritable bowel syndrome) 08/25/2010  . Gastroparesis 06/30/2010  . Non-ulcer dyspepsia 04/03/2010   Social History   Socioeconomic History  . Marital status: Married    Spouse name: Not on file  . Number of children: 0  . Years of education: Not on file  . Highest education level: Not on file  Occupational History  . Occupation: Interior and spatial designer    Comment: Dentist: [employer]  Tobacco Use  . Smoking status: Former Smoker    Packs/day: 0.50    Years: 6.00    Pack years: 3.00    Types: Cigarettes  . Smokeless tobacco: Never Used  . Tobacco comment: USE SMOKELESS CIGARETTES  Vaping Use  . Vaping Use: Never used  Substance and Sexual Activity  . Alcohol use: Yes    Alcohol/week: 0.0 standard drinks    Comment: 1 a week, wine  . Drug use: No  . Sexual activity: Not on file  Other Topics Concern  . Not on file  Social History Narrative  . Not on file   Social Determinants of Health   Financial Resource Strain:   . Difficulty of Paying Living Expenses:   Food Insecurity:   . Worried About Programme researcher, broadcasting/film/video in the Last Year:   . Barista in the Last Year:   Transportation Needs:   . Freight forwarder (Medical):   Marland Kitchen Lack of Transportation (Non-Medical):   Physical Activity:   . Days of Exercise per Week:   . Minutes of Exercise per Session:   Stress:   . Feeling of Stress :   Social Connections:   . Frequency of Communication with Friends and Family:   . Frequency of Social Gatherings with Friends and Family:   . Attends Religious Services:   . Active Member of Clubs or Organizations:   . Attends Banker Meetings:   Marland Kitchen Marital Status:   Intimate Partner Violence:   . Fear of Current or Ex-Partner:   . Emotionally Abused:   Marland Kitchen Physically Abused:   . Sexually Abused:     Ms. Koshiol family history includes Diabetes in her father; Heart attack in her father;  Hypertension in her mother.      Objective:    Vitals:   06/21/19 1353  BP: 110/70  Pulse: 77    Physical Exam Well-developed well-nourished female in no acute distress.  Height, Weight,105 BMI20.5  HEENT; nontraumatic normocephalic, EOMI, PE RR LA, sclera anicteric. Oropharynx; not examined Neck; supple, no JVD Cardiovascular; regular rate and rhythm with S1-S2, no murmur rub or gallop Pulmonary; Clear bilaterally Abdomen; soft, nontender, nondistended, no palpable mass or hepatosplenomegaly, bowel sounds are active Rectal; not done today Skin; benign exam, no jaundice rash or appreciable lesions Extremities; no clubbing cyanosis or edema skin warm and dry Neuro/Psych; alert and oriented x4, grossly nonfocal mood and affect appropriate  Assessment & Plan:   #64 64 year old white female with history of gastroparesis-maintained on domperidone 10 mg daily, recent exacerbation of symptoms when she ran out of medicine for about 2 weeks #2 IBS/constipation-continue Linzess 145 mcg daily.  Add MiraLAX one half dose daily in 8 ounces of water, stop Metamucil #3 GERD-stable.  She has been back on Dexilant 60 mg daily and has not been requiring p.m. omeprazole. #4 she has expressed concerns about possible infection or parasitic infection, I think this is unlikely but can check stool for O&P #5 colon cancer surveillance-will be due for colonoscopy 2022  Plan; as above continue Dexilant 60 mg p.o. every morning, may use omeprazole 40 mg p.o. every afternoon on a as needed basis Continue Linzess 145 mcg p.o. daily, add MiraLAX one half dose daily Stop Metamucil Stool for O&P Continue domperidone 10 mg p.o. daily If symptoms do not improve on above regimen can consider an empiric trial of Xifaxan   Sammuel Cooper PA-C 06/22/2019   Cc: Junie Spencer, FNP

## 2019-06-26 NOTE — Progress Notes (Signed)
Noted  

## 2019-06-28 DIAGNOSIS — K219 Gastro-esophageal reflux disease without esophagitis: Secondary | ICD-10-CM | POA: Diagnosis not present

## 2019-06-28 DIAGNOSIS — K5900 Constipation, unspecified: Secondary | ICD-10-CM | POA: Diagnosis not present

## 2019-06-28 DIAGNOSIS — K581 Irritable bowel syndrome with constipation: Secondary | ICD-10-CM | POA: Diagnosis not present

## 2019-06-28 DIAGNOSIS — K3184 Gastroparesis: Secondary | ICD-10-CM | POA: Diagnosis not present

## 2019-06-30 DIAGNOSIS — N3001 Acute cystitis with hematuria: Secondary | ICD-10-CM | POA: Diagnosis not present

## 2019-06-30 DIAGNOSIS — Z6820 Body mass index (BMI) 20.0-20.9, adult: Secondary | ICD-10-CM | POA: Diagnosis not present

## 2019-06-30 DIAGNOSIS — M549 Dorsalgia, unspecified: Secondary | ICD-10-CM | POA: Diagnosis not present

## 2019-07-05 LAB — SPECIMEN STATUS REPORT

## 2019-07-05 LAB — OVA AND PARASITE EXAMINATION

## 2019-07-10 ENCOUNTER — Telehealth: Payer: Self-pay | Admitting: Family

## 2019-07-10 ENCOUNTER — Ambulatory Visit (INDEPENDENT_AMBULATORY_CARE_PROVIDER_SITE_OTHER): Payer: BC Managed Care – PPO | Admitting: Nurse Practitioner

## 2019-07-10 DIAGNOSIS — R300 Dysuria: Secondary | ICD-10-CM

## 2019-07-10 DIAGNOSIS — Z8619 Personal history of other infectious and parasitic diseases: Secondary | ICD-10-CM

## 2019-07-10 LAB — URINALYSIS, COMPLETE
Bilirubin, UA: NEGATIVE
Glucose, UA: NEGATIVE
Leukocytes,UA: NEGATIVE
Nitrite, UA: NEGATIVE
Protein,UA: NEGATIVE
RBC, UA: NEGATIVE
Specific Gravity, UA: 1.025 (ref 1.005–1.030)
Urobilinogen, Ur: 0.2 mg/dL (ref 0.2–1.0)
pH, UA: 5.5 (ref 5.0–7.5)

## 2019-07-10 LAB — MICROSCOPIC EXAMINATION
Epithelial Cells (non renal): >10 /hpf — AB (ref 0–10)
RBC, Urine: NONE SEEN /hpf (ref 0–2)
Renal Epithel, UA: NONE SEEN /hpf

## 2019-07-10 MED ORDER — VALACYCLOVIR HCL 1 G PO TABS: 1000.0000 mg | ORAL_TABLET | Freq: Every day | ORAL | 4 refills | Status: DC

## 2019-07-10 MED ORDER — FLUCONAZOLE 150 MG PO TABS
150.0000 mg | ORAL_TABLET | Freq: Once | ORAL | 0 refills | Status: AC
Start: 2019-07-10 — End: 2019-07-10

## 2019-07-10 NOTE — Progress Notes (Signed)
   Virtual Visit via telephone Note Due to COVID-19 pandemic this visit was conducted virtually. This visit type was conducted due to national recommendations for restrictions regarding the COVID-19 Pandemic (e.g. social distancing, sheltering in place) in an effort to limit this patient's exposure and mitigate transmission in our community. All issues noted in this document were discussed and addressed.  A physical exam was not performed with this format.  I connected with Ruth Terry on 07/10/19 at 9:35 by telephone and verified that I am speaking with the correct person using two identifiers. Ruth Terry is currently located at work and no one is currently with  her during visit. The provider, Mary-Margaret Hassell Done, FNP is located in their office at time of visit.  I discussed the limitations, risks, security and privacy concerns of performing an evaluation and management service by telephone and the availability of in person appointments. I also discussed with the patient that there may be a patient responsible charge related to this service. The patient expressed understanding and agreed to proceed.   History and Present Illness:  Chief Complaint: Urinary Tract Infection   HPI Patient calls in for appointmnet c/o dysuria and urinary frequency. Sh ewent to urgent care 1 week ago and wa put on keflex which made her sick. It was changed to doxycycline. She finished the doxy yesterday. Still having slight dysuria. She would like to hav eurine checked.  * needs refill on valtrex at [facility]  Review of Systems  Genitourinary: Positive for dysuria, frequency and urgency.  All other systems reviewed and are negative.    Review of Systems  Genitourinary: Positive for dysuria, frequency and urgency.  All other systems reviewed and are negative.    Observations/Objective: Alert and oriented- answers all questions appropriately No distress    Assessment and Plan: Ruth Terry in  today with chief complaint of Urinary Tract Infection   1. History of shingles  - valACYclovir (VALTREX) 1000 MG tablet; Take 1 tablet (1,000 mg total) by mouth at bedtime.  Dispense: 90 tablet; Refill: 4  2. Dysuria Patient will come in and leave a urine sample- we will call he rwith results - Urinalysis, Complete; Future     Follow Up Instructions: prn    I discussed the assessment and treatment plan with the patient. The patient was provided an opportunity to ask questions and all were answered. The patient agreed with the plan and demonstrated an understanding of the instructions.   The patient was advised to call back or seek an in-person evaluation if the symptoms worsen or if the condition fails to improve as anticipated.  The above assessment and management plan was discussed with the patient. The patient verbalized understanding of and has agreed to the management plan. Patient is aware to call the clinic if symptoms persist or worsen. Patient is aware when to return to the clinic for a follow-up visit. Patient educated on when it is appropriate to go to the emergency department.   Time call ended:  9:46  I provided 15 minutes of non-face-to-face time during this encounter.    Mary-Margaret Hassell Done, FNP

## 2019-07-10 NOTE — Telephone Encounter (Signed)
This was sent to [facility] by [facility] during her visit today.

## 2019-07-10 NOTE — Addendum Note (Signed)
Addended by: Pollyann Kennedy F on: 07/10/2019 03:35 PM   Modules accepted: Orders

## 2019-07-10 NOTE — Telephone Encounter (Signed)
Pt wants her refills for valACYclovir (VALTREX) 1000 MG tablet to go to [facility]. Only this medication.

## 2019-07-12 ENCOUNTER — Telehealth: Payer: Self-pay | Admitting: Family

## 2019-07-12 NOTE — Telephone Encounter (Signed)
Please review lab results.

## 2019-07-17 ENCOUNTER — Encounter: Payer: Self-pay | Admitting: Urology

## 2019-07-17 ENCOUNTER — Other Ambulatory Visit: Payer: Self-pay

## 2019-07-17 ENCOUNTER — Ambulatory Visit (INDEPENDENT_AMBULATORY_CARE_PROVIDER_SITE_OTHER): Payer: BC Managed Care – PPO | Admitting: Urology

## 2019-07-17 VITALS — BP 118/72 | HR 86 | Temp 99.5°F | Ht 60.0 in | Wt 108.0 lb

## 2019-07-17 DIAGNOSIS — R300 Dysuria: Secondary | ICD-10-CM | POA: Diagnosis not present

## 2019-07-17 LAB — POCT URINALYSIS DIPSTICK
Bilirubin, UA: NEGATIVE
Blood, UA: NEGATIVE
Glucose, UA: NEGATIVE
Ketones, UA: NEGATIVE
Leukocytes, UA: NEGATIVE
Nitrite, UA: NEGATIVE
Protein, UA: NEGATIVE
Spec Grav, UA: 1.01 (ref 1.010–1.025)
Urobilinogen, UA: NEGATIVE E.U./dL — AB
pH, UA: 6 (ref 5.0–8.0)

## 2019-07-17 LAB — BLADDER SCAN AMB NON-IMAGING: Scan Result: 41.7

## 2019-07-17 NOTE — Progress Notes (Signed)
Urological Symptom Review  Patient is experiencing the following symptoms: Trouble starting stream Urinary tract infection   Review of Systems  Gastrointestinal (upper)  : Negative for upper GI symptoms  Gastrointestinal (lower) : Constipation  Constitutional : Fatigue  Skin: Negative for skin symptoms  Eyes: Negative for eye symptoms  Ear/Nose/Throat : Negative for Ear/Nose/Throat symptoms  Hematologic/Lymphatic: Negative for Hematologic/Lymphatic symptoms  Cardiovascular : Negative for cardiovascular symptoms  Respiratory : Negative for respiratory symptoms  Endocrine: Negative for endocrine symptoms  Musculoskeletal: Back pain  Neurological: Negative for neurological symptoms  Psychologic: Anxiety

## 2019-07-17 NOTE — Progress Notes (Signed)
H&P  Chief Complaint: Follow-up UTI  History of Present Illness: Ruth Terry is a 64 y.o. year old female previously seen in early 2019 for voiding dysfunction at which time she was taken off of alfuzosin.  She comes in the day for follow-up of abnormal urinalyses.  She apparently went to an urgent care about 3 weeks ago for right hip pain.  She had a urinalysis performed and was told that she had a urinary tract infection.  She was put on a cephalosporin at first, which she got sick from.  She was then put on doxycycline.  She was not symptomatic from the urinary tract infection.  She had a follow-up urinalysis done last week which revealed trace nitrite.  She was also worried about having a small amount of bacteria.  She comes in today just for follow-up.  She has not had frequent urinary tract infections.  She denies significant lower urinary tract symptomatology. Past Medical History:  Diagnosis Date  . Anal fissure   . Chronic constipation   . Diverticulosis 04-20-10   colonoscopy  . Gastroparesis   . GERD (gastroesophageal reflux disease)   . Hemorrhoids   . Hyperlipidemia   . IBS (irritable bowel syndrome)   . MVP (mitral valve prolapse)   . Polyp, stomach 04-20-10   egd  . Shingles   . UTI (lower urinary tract infection)     Past Surgical History:  Procedure Laterality Date  . COLONOSCOPY    . ESOPHAGOGASTRODUODENOSCOPY      Home Medications:  (Not in a hospital admission)   Allergies:  Allergies  Allergen Reactions  . Chlordiazepoxide-Clidinium Itching  . Ciprofloxacin Itching    Irritates stomach  . Flagyl [Metronidazole]     Severe yeast infection  . Sulfa Drugs Cross Reactors Nausea Only  . Nitrofurantoin Monohyd Macro Rash    Family History  Problem Relation Age of Onset  . Hypertension Mother   . Diabetes Father   . Heart attack Father        Age 93    Social History:  reports that she has quit smoking. Her smoking use included cigarettes. She has  a 3.00 pack-year smoking history. She has never used smokeless tobacco. She reports current alcohol use. She reports that she does not use drugs.  ROS: A complete review of systems was performed.  All systems are negative except for pertinent findings as noted.  Physical Exam:  Vital signs in last 24 hours: @VSRANGES @ General:  Alert and oriented, No acute distress HEENT: Normocephalic, atraumatic Neck: No JVD or lymphadenopathy Cardiovascular: Regular rate  Lungs: Normal inspiratory/expiratory excursion Extremities: No edema Neurologic: Grossly intact  Laboratory Data:  No results found for this or any previous visit (from the past 24 hour(s)). Recent Results (from the past 240 hour(s))  Microscopic Examination     Status: Abnormal   Collection Time: 07/10/19  4:46 PM   Urine  Result Value Ref Range Status   WBC, UA 0-5 0 - 5 /hpf Final   RBC None seen 0 - 2 /hpf Final   Epithelial Cells (non renal) >10 (A) 0 - 10 /hpf Final   Renal Epithel, UA None seen None seen /hpf Final   Bacteria, UA Few None seen/Few Final   I have reviewed prior pt notes  I have reviewed notes from referring/previous physicians  I have reviewed urinalysis results     Impression/Assessment:  Questionable history of UTI with minimal symptomatology  Patient currently asymptomatic and has normal  urinalysis  Plan:  I reassured her about her urinalysis  I will have her come back as needed  Jorja Loa 07/17/2019, 7:04 AM  Lillette Boxer. Abbagail Scaff MD

## 2019-08-06 ENCOUNTER — Encounter: Payer: Self-pay | Admitting: Family Medicine

## 2019-08-06 ENCOUNTER — Other Ambulatory Visit: Payer: Self-pay

## 2019-08-06 ENCOUNTER — Ambulatory Visit (INDEPENDENT_AMBULATORY_CARE_PROVIDER_SITE_OTHER): Payer: BC Managed Care – PPO

## 2019-08-06 ENCOUNTER — Ambulatory Visit: Payer: BC Managed Care – PPO

## 2019-08-06 ENCOUNTER — Ambulatory Visit (INDEPENDENT_AMBULATORY_CARE_PROVIDER_SITE_OTHER): Payer: BC Managed Care – PPO | Admitting: Family Medicine

## 2019-08-06 VITALS — BP 122/77 | HR 87 | Temp 97.1°F | Ht 60.0 in | Wt 104.8 lb

## 2019-08-06 DIAGNOSIS — R29898 Other symptoms and signs involving the musculoskeletal system: Secondary | ICD-10-CM | POA: Diagnosis not present

## 2019-08-06 DIAGNOSIS — H938X3 Other specified disorders of ear, bilateral: Secondary | ICD-10-CM

## 2019-08-06 DIAGNOSIS — I700 Atherosclerosis of aorta: Secondary | ICD-10-CM

## 2019-08-06 DIAGNOSIS — Z780 Asymptomatic menopausal state: Secondary | ICD-10-CM

## 2019-08-06 DIAGNOSIS — M545 Low back pain: Secondary | ICD-10-CM | POA: Diagnosis not present

## 2019-08-06 DIAGNOSIS — Z7689 Persons encountering health services in other specified circumstances: Secondary | ICD-10-CM | POA: Diagnosis not present

## 2019-08-06 DIAGNOSIS — E781 Pure hyperglyceridemia: Secondary | ICD-10-CM | POA: Diagnosis not present

## 2019-08-06 DIAGNOSIS — M792 Neuralgia and neuritis, unspecified: Secondary | ICD-10-CM

## 2019-08-06 IMAGING — DX DG LUMBAR SPINE 2-3V
2 series · 2 of 2 positions shown · non-contrast
Comparison: None.

CLINICAL DATA: Back pain

EXAM:
LUMBAR SPINE - 2-3 VIEW

[l-spine ap]
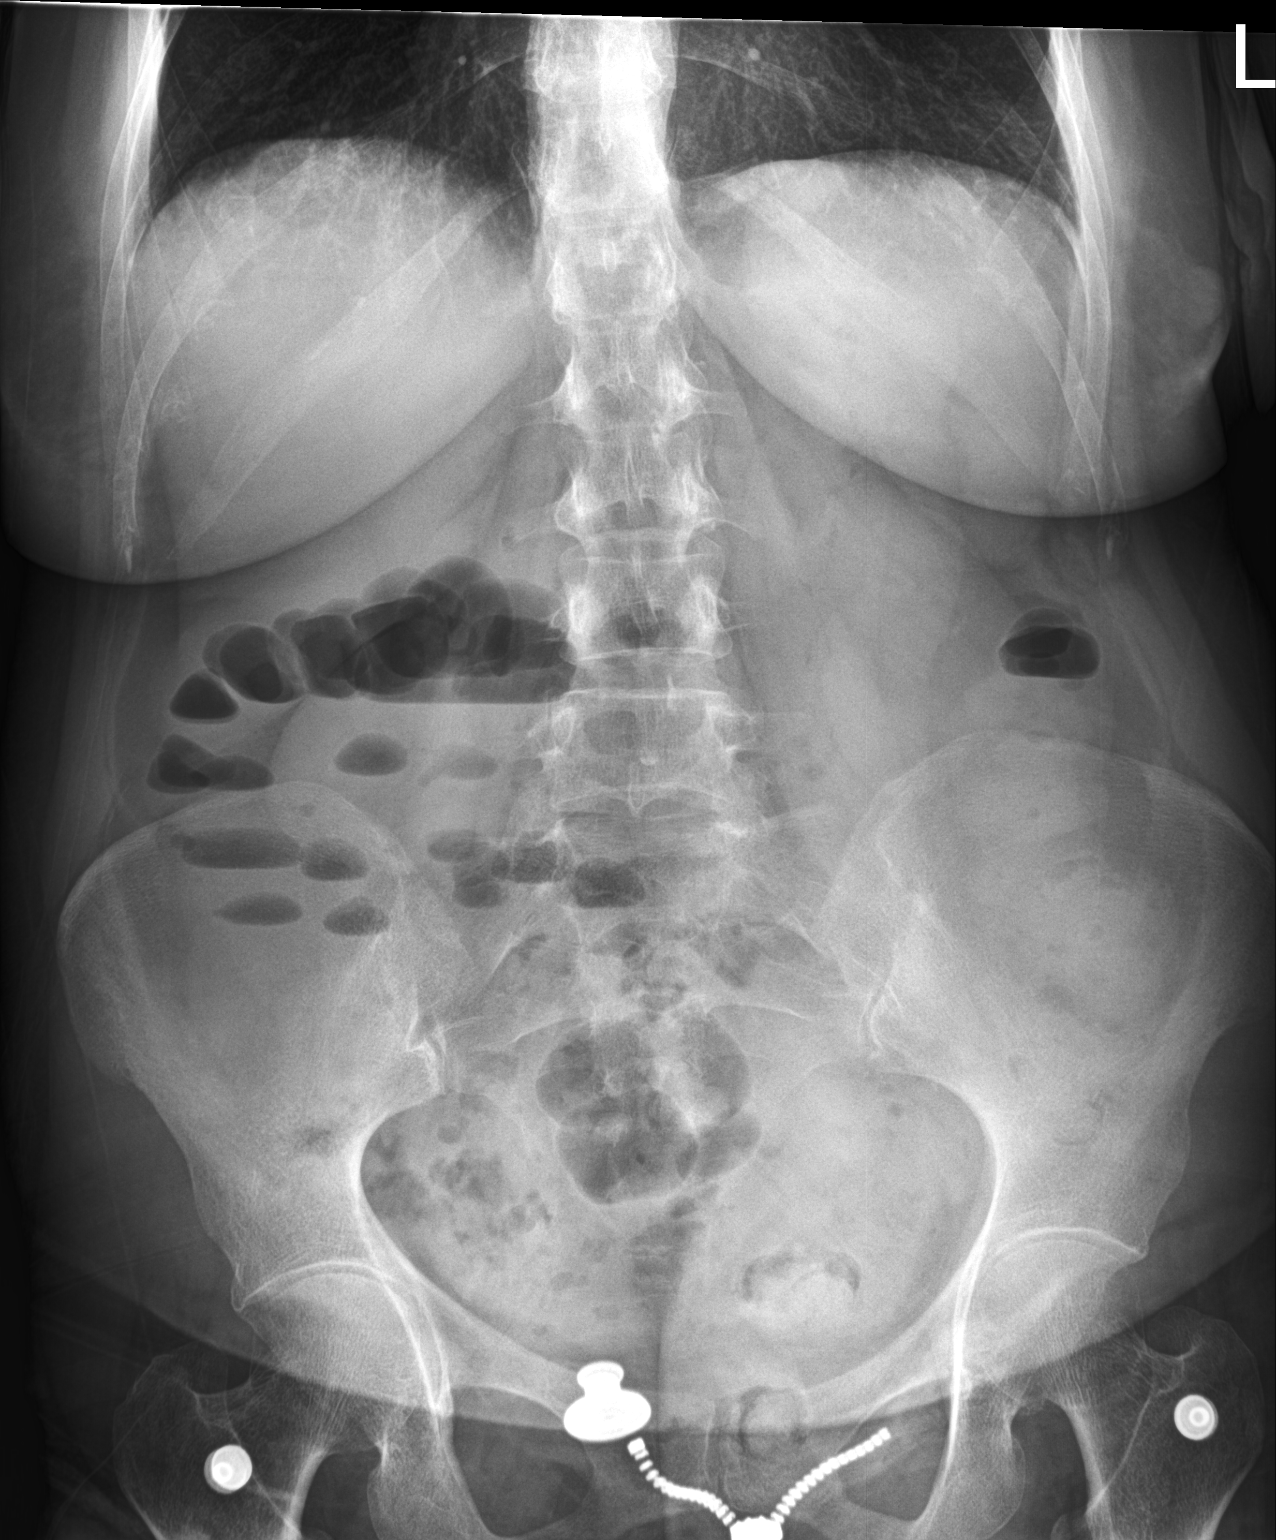

[l-spine lat]
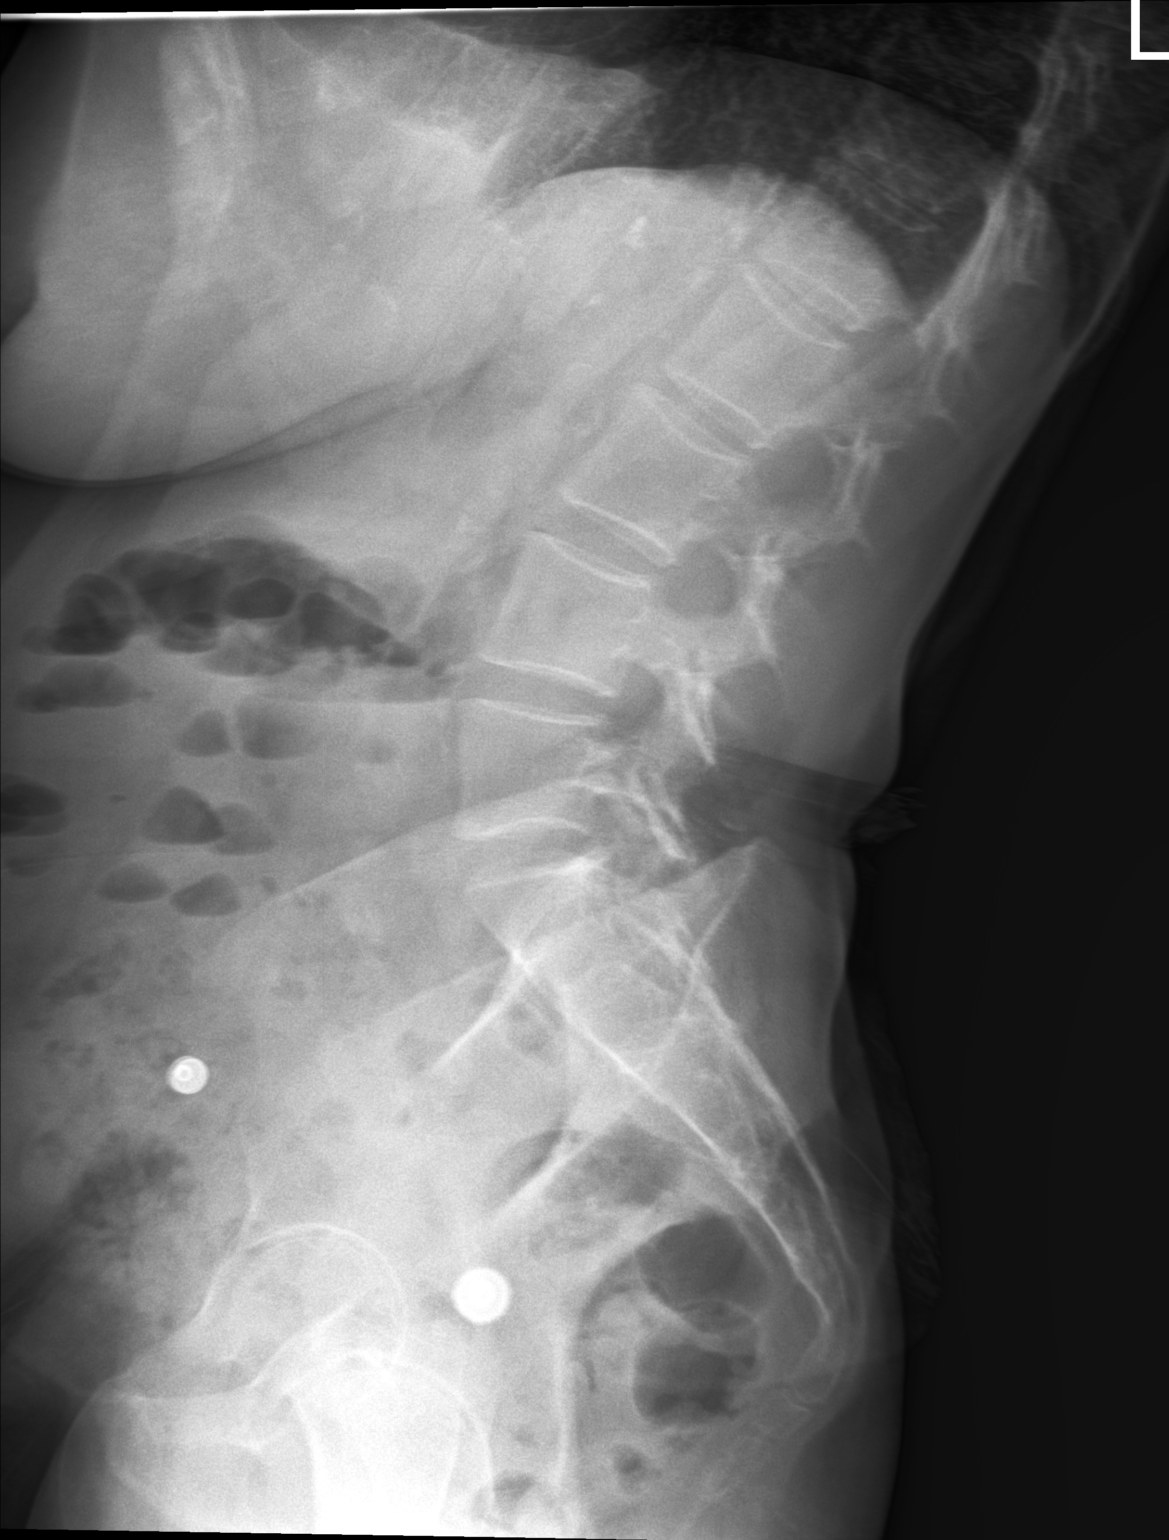

[2 of 2 positions shown; findings below may reference images not displayed]

FINDINGS: Fro there are multiple air-fluid levels involving loops of small
bowel. No free air. Ntal and lateral views were obtained. There are
5 non-rib-bearing lumbar type vertebral bodies. There is no fracture
or spondylolisthesis. Disc spaces appear normal. There are foci of
aortic atherosclerosis.
IMPRESSION: 1.  No fracture or spondylolisthesis.  No appreciable arthropathy.

2. Multiple air-fluid levels in the small bowel. Question a degree
of enteritis or ileus. Bowel obstruction felt to be less likely.

3.  Aortic Atherosclerosis ([FV]-[FV]).

These results will be called to the ordering clinician or
representative by the Radiologist Assistant, and communication
documented in the PACS or [REDACTED].

## 2019-08-06 MED ORDER — PREGABALIN 75 MG PO CAPS: ORAL_CAPSULE | ORAL | 0 refills | Status: DC

## 2019-08-06 NOTE — Progress Notes (Signed)
Subjective: CC: Establish care, sensation of leg weakness, ear fullness, anxiety disorder PCP: Ruth Norlander, DO Ruth Terry is a 64 y.o. female presenting to clinic today for:  1.  Sensation of bilateral leg weakness Patient reports sensation of bilateral leg weakness.  She notes she "feels [place]".  Sometimes she has burning in her legs and back.  She thought this initially to be secondary to shingles.  It mostly affects her left buttock.  However, it does shoot to her toes sometimes.  She denies any preceding injury to her back.  Does not report any urinary retention or fecal incontinence.  She tried gabapentin for postherpetic neuralgia in the past and she had adverse side effects of that one.  However, she would be willing to try Lyrica if recommended.  2.  Ear fullness Patient reports bilateral ear fullness and itching.  She is not on any oral antihistamines.  She has Flonase at home but does not use it.  Denies any fevers.  No substantial nasal drainage.  Eyes are dry sometimes but does not report itching.  She wears contact lenses  3.  Anxiety disorder She reports increased anxiety.  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  She also works a full time job.  She just feels overwhelmed.  She is not currently on any medications.  Her days are usually 12 to 13-hour days without a break.  She certainly would not have time at this time for counseling.  However, she would be amenable to medicines.   ROS: Per HPI  Allergies  Allergen Reactions   Chlordiazepoxide-Clidinium Itching   Ciprofloxacin Itching    Irritates stomach   Flagyl [Metronidazole]     Severe yeast infection   Sulfa Drugs Cross Reactors Nausea Only   Cefdinir Nausea And Vomiting   Nitrofurantoin Monohyd Macro Rash   Past Medical History:  Diagnosis Date   Anal fissure    Chronic constipation    Diverticulosis 04-20-10    colonoscopy   Gastroparesis    GERD (gastroesophageal reflux disease)    Hemorrhoids    Hyperlipidemia    IBS (irritable bowel syndrome)    MVP (mitral valve prolapse)    Polyp, stomach 04-20-10   egd   Shingles    UTI (lower urinary tract infection)     Current Outpatient Medications:    AMBULATORY NON FORMULARY MEDICATION, Medication Name: Domperidone 10 mg Take 1 tablet at bedtime daily., Disp: 30 tablet, Rfl: 6   dexlansoprazole (DEXILANT) 60 MG capsule, 1 tablet daily, Disp: 30 capsule, Rfl: 5   dexlansoprazole (DEXILANT) 60 MG capsule, Take 1 capsule by mouth daily., Disp: , Rfl:    fluconazole (DIFLUCAN) 150 MG tablet, Take 150 mg by mouth once., Disp: , Rfl:    linaclotide (LINZESS) 145 MCG CAPS capsule, Take 1 capsule (145 mcg total) by mouth daily before breakfast., Disp: 30 capsule, Rfl: 5   pantoprazole (PROTONIX) 40 MG tablet, Take by mouth., Disp: , Rfl:    Probiotic Product (ALIGN) 4 MG CAPS, Take 1 capsule by mouth daily., Disp: , Rfl:    psyllium (METAMUCIL) 58.6 % packet, Take 1 packet by mouth daily., Disp: , Rfl:    Simethicone (PHAZYME MAXIMUM STRENGTH) 250 MG CAPS, Take 250 mg by mouth 3 (three) times daily. (Patient taking differently: Take 250 mg by mouth 3 (three) times daily as needed (gas relief). ), Disp: 14 capsule, Rfl:    valACYclovir (  VALTREX) 1000 MG tablet, Take 1 tablet (1,000 mg total) by mouth at bedtime., Disp: 90 tablet, Rfl: 4   valACYclovir (VALTREX) 1000 MG tablet, Take 1 tablet by mouth daily., Disp: , Rfl:  Social History   Socioeconomic History   Marital status: Married    Spouse name: Not on file   Number of children: 0   Years of education: Not on file   Highest education level: Not on file  Occupational History   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    Employer: [employer]  Tobacco Use   Smoking status: Former Smoker    Packs/day: 0.50    Years: 6.00    Pack  years: 3.00    Types: Cigarettes   Smokeless tobacco: Never Used   Tobacco comment: USE SMOKELESS CIGARETTES  Vaping Use   Vaping Use: Never used  Substance and Sexual Activity   Alcohol use: Yes    Alcohol/week: 0.0 standard drinks    Comment: 1 a week, wine   Drug use: No   Sexual activity: Not on file  Other Topics Concern   Not on file  Social History Narrative   Not on file   Social Determinants of Health   Financial Resource Strain:    Difficulty of Paying Living Expenses:   Food Insecurity:    Worried About Charity fundraiser in the Last Year:    Arboriculturist in the Last Year:   Transportation Needs:    Film/video editor (Medical):    Lack of Transportation (Non-Medical):   Physical Activity:    Days of Exercise per Week:    Minutes of Exercise per Session:   Stress:    Feeling of Stress :   Social Connections:    Frequency of Communication with Friends and Family:    Frequency of Social Gatherings with Friends and Family:    Attends Religious Services:    Active Member of Clubs or Organizations:    Attends Music therapist:    Marital Status:   Intimate Partner Violence:    Fear of Current or Ex-Partner:    Emotionally Abused:    Physically Abused:    Sexually Abused:    Family History  Problem Relation Age of Onset   Hypertension Mother    Diabetes Father    Heart attack Father        Age 8    Objective: Office vital signs reviewed. BP 122/77    Pulse 87    Temp (!) 97.1 F (36.2 C)    Ht 5' (1.524 m)    Wt 104 lb 12.8 oz (47.5 kg)    SpO2 97%    BMI 20.47 kg/m   Physical Examination:  General: Awake, alert, well nourished, No acute distress HEENT: Normal, sclera white, MMM; no exophthalmos.  No goiter Cardio: regular rate and rhythm, S1S2 heard, no murmurs appreciated Pulm: clear to auscultation bilaterally, no wheezes, rhonchi or rales; normal work of breathing on room air Extremities: warm,  well perfused, No edema, cyanosis or clubbing; +2 pulses bilaterally MSK: normal gait and station  Lumbar spine: No midline tenderness palpation.  Mild paraspinal muscle tenderness to palpation over the lumbosacral area.  No palpable bony abnormalities.  She has 4 out of 5 strength in bilateral lower extremities in all planes.  Negative FABER, negative FADIR.  Negative straight leg raise bilaterally. Neuro: Light touch sensation symmetric and intact.  Patellar DTRs 2 out  of 4 bilaterally  DG Lumbar Spine 2-3 Views  Result Date: 08/06/2019 CLINICAL DATA:  Back pain EXAM: LUMBAR SPINE - 2-3 VIEW COMPARISON:  None. FINDINGS: Fro there are multiple air-fluid levels involving loops of small bowel. No free air. Ntal and lateral views were obtained. There are 5 non-rib-bearing lumbar type vertebral bodies. There is no fracture or spondylolisthesis. Disc spaces appear normal. There are foci of aortic atherosclerosis. IMPRESSION: 1.  No fracture or spondylolisthesis.  No appreciable arthropathy. 2. Multiple air-fluid levels in the small bowel. Question a degree of enteritis or ileus. Bowel obstruction felt to be less likely. 3.  Aortic Atherosclerosis (ICD10-I70.0). These results will be called to the ordering clinician or representative by the Radiologist Assistant, and communication documented in the PACS or [employer]. Electronically Signed   By: Lowella Grip III M.D.   On: 08/06/2019 09:26   Assessment/ Plan: 64 y.o. female   1. Complaints of leg weakness We will look for metabolic etiology.  Personal view of the lumbar spine showed slight degenerative changes but the disc spaces appear to be fairly well-preserved.  There was concern for possible ileus on her imaging.  She is having baseline bowel movements.  She does struggle at baseline with chronic constipation.  She follows with GI for this - CMP14+EGFR - Vitamin B12 - CBC - TSH - DG Lumbar Spine 2-3 Views; Future  2. Establishing care  with new doctor, encounter for  3. Aortic atherosclerosis (HCC) Check fasting lipid - Lipid Panel  4. Hypertriglyceridemia - Lipid Panel  5. Asymptomatic postmenopausal estrogen deficiency - DG [facility] DEXA; Future  6. Ear fullness, bilateral Likely eustachian tube dysfunction.  Start oral antihistamine.  Start back on Flonase  7. Neuropathic pain Trial of Lyrica.  The national narcotic database was reviewed and there were no red flags.  We will follow-up in 4 weeks.  Hopefully Lyrica will have dual effectiveness against both her neuropathic pain and anxiety disorder.  This is used as an off label medication.  If no significant improvement, will plan for SSRI - pregabalin (LYRICA) 75 MG capsule; Take 1 capsule (75 mg total) by mouth at bedtime for 7 days, THEN 1 capsule (75 mg total) 2 (two) times daily.  Dispense: 60 capsule; Refill: 0   No orders of the defined types were placed in this encounter.  No orders of the defined types were placed in this encounter.    Ruth Norlander, DO [place] [phone]

## 2019-08-06 NOTE — Patient Instructions (Signed)
Start Industrial/product designer for ears. Flonase fine  You had labs performed today.  You will be contacted with the results of the labs once they are available, usually in the next 3 business days for routine lab work.  If you have an active my chart account, they will be released to your MyChart.  If you prefer to have these labs released to you via telephone, please let us know.  If you had a pap smear or biopsy performed, expect to be contacted in about 7-10 days.  Checking an xray for your back/ weakness   Eustachian Tube Dysfunction  Eustachian tube dysfunction refers to a condition in which a blockage develops in the narrow passage that connects the middle ear to the back of the nose (eustachian tube). The eustachian tube regulates air pressure in the middle ear by letting air move between the ear and nose. It also helps to drain fluid from the middle ear space. Eustachian tube dysfunction can affect one or both ears. When the eustachian tube does not function properly, air pressure, fluid, or both can build up in the middle ear. What are the causes? This condition occurs when the eustachian tube becomes blocked or cannot open normally. Common causes of this condition include:  Ear infections.  Colds and other infections that affect the nose, mouth, and throat (upper respiratory tract).  Allergies.  Irritation from cigarette smoke.  Irritation from stomach acid coming up into the esophagus (gastroesophageal reflux). The esophagus is the tube that carries food from the mouth to the stomach.  Sudden changes in air pressure, such as from descending in an airplane or scuba diving.  Abnormal growths in the nose or throat, such as: ? Growths that line the nose (nasal polyps). ? Abnormal growth of cells (tumors). ? Enlarged tissue at the back of the throat (adenoids). What increases the risk? You are more likely to develop this condition if:  You smoke.  You are overweight.  You are a  child who has: ? Certain birth defects of the mouth, such as cleft palate. ? Large tonsils or adenoids. What are the signs or symptoms? Common symptoms of this condition include:  A feeling of fullness in the ear.  Ear pain.  Clicking or popping noises in the ear.  Ringing in the ear.  Hearing loss.  Loss of balance.  Dizziness. Symptoms may get worse when the air pressure around you changes, such as when you travel to an area of high elevation, fly on an airplane, or go scuba diving. How is this diagnosed? This condition may be diagnosed based on:  Your symptoms.  A physical exam of your ears, nose, and throat.  Tests, such as those that measure: ? The movement of your eardrum (tympanogram). ? Your hearing (audiometry). How is this treated? Treatment depends on the cause and severity of your condition.  In mild cases, you may relieve your symptoms by moving air into your ears. This is called "popping the ears."  In more severe cases, or if you have symptoms of fluid in your ears, treatment may include: ? Medicines to relieve congestion (decongestants). ? Medicines that treat allergies (antihistamines). ? Nasal sprays or ear drops that contain medicines that reduce swelling (steroids). ? A procedure to drain the fluid in your eardrum (myringotomy). In this procedure, a small tube is placed in the eardrum to:  Drain the fluid.  Restore the air in the middle ear space. ? A procedure to insert a balloon device through  the nose to inflate the opening of the eustachian tube (balloon dilation). Follow these instructions at home: Lifestyle  Do not do any of the following until your health care provider approves: ? Travel to high altitudes. ? Fly in airplanes. ? Work in a Pension scheme manager or room. ? Scuba dive.  Do not use any products that contain nicotine or tobacco, such as cigarettes and e-cigarettes. If you need help quitting, ask your health care provider.  Keep  your ears dry. Wear fitted earplugs during showering and bathing. Dry your ears completely after. General instructions  Take over-the-counter and prescription medicines only as told by your health care provider.  Use techniques to help pop your ears as recommended by your health care provider. These may include: ? Chewing gum. ? Yawning. ? Frequent, forceful swallowing. ? Closing your mouth, holding your nose closed, and gently blowing as if you are trying to blow air out of your nose.  Keep all follow-up visits as told by your health care provider. This is important. Contact a health care provider if:  Your symptoms do not go away after treatment.  Your symptoms come back after treatment.  You are unable to pop your ears.  You have: ? A fever. ? Pain in your ear. ? Pain in your head or neck. ? Fluid draining from your ear.  Your hearing suddenly changes.  You become very dizzy.  You lose your balance. Summary  Eustachian tube dysfunction refers to a condition in which a blockage develops in the eustachian tube.  It can be caused by ear infections, allergies, inhaled irritants, or abnormal growths in the nose or throat.  Symptoms include ear pain, hearing loss, or ringing in the ears.  Mild cases are treated with maneuvers to unblock the ears, such as yawning or ear popping.  Severe cases are treated with medicines. Surgery may also be done (rare). This information is not intended to replace advice given to you by your health care provider. Make sure you discuss any questions you have with your health care provider. Document Revised: 04/12/2017 Document Reviewed: 04/12/2017 Elsevier Patient Education  [place].

## 2019-08-07 LAB — CMP14+EGFR
ALT: 12 IU/L (ref 0–32)
AST: 13 IU/L (ref 0–40)
Albumin/Globulin Ratio: 2 (ref 1.2–2.2)
Albumin: 4.3 g/dL (ref 3.8–4.8)
Alkaline Phosphatase: 89 IU/L (ref 48–121)
BUN/Creatinine Ratio: 8 — ABNORMAL LOW (ref 12–28)
BUN: 6 mg/dL — ABNORMAL LOW (ref 8–27)
Bilirubin Total: 0.3 mg/dL (ref 0.0–1.2)
CO2: 22 mmol/L (ref 20–29)
Calcium: 9.6 mg/dL (ref 8.7–10.3)
Chloride: 104 mmol/L (ref 96–106)
Creatinine, Ser: 0.73 mg/dL (ref 0.57–1.00)
GFR calc Af Amer: 102 mL/min/{1.73_m2} (ref >59)
GFR calc non Af Amer: 89 mL/min/{1.73_m2} (ref >59)
Globulin, Total: 2.2 g/dL (ref 1.5–4.5)
Glucose: 90 mg/dL (ref 65–99)
Potassium: 3.9 mmol/L (ref 3.5–5.2)
Sodium: 143 mmol/L (ref 134–144)
Total Protein: 6.5 g/dL (ref 6.0–8.5)

## 2019-08-07 LAB — CBC
Hematocrit: 43.1 % (ref 34.0–46.6)
Hemoglobin: 14.6 g/dL (ref 11.1–15.9)
MCH: 29.6 pg (ref 26.6–33.0)
MCHC: 33.9 g/dL (ref 31.5–35.7)
MCV: 87 fL (ref 79–97)
Platelets: 207 10*3/uL (ref 150–450)
RBC: 4.93 x10E6/uL (ref 3.77–5.28)
RDW: 13.1 % (ref 11.7–15.4)
WBC: 4 10*3/uL (ref 3.4–10.8)

## 2019-08-07 LAB — TSH: TSH: 1.9 u[IU]/mL (ref 0.450–4.500)

## 2019-08-07 LAB — LIPID PANEL
Chol/HDL Ratio: 3.3 ratio (ref 0.0–4.4)
Cholesterol, Total: 235 mg/dL — ABNORMAL HIGH (ref 100–199)
HDL: 72 mg/dL (ref >39)
LDL Chol Calc (NIH): 140 mg/dL — ABNORMAL HIGH (ref 0–99)
Triglycerides: 133 mg/dL (ref 0–149)
VLDL Cholesterol Cal: 23 mg/dL (ref 5–40)

## 2019-08-07 LAB — VITAMIN B12: Vitamin B-12: 436 pg/mL (ref 232–1245)

## 2019-08-10 ENCOUNTER — Telehealth: Payer: Self-pay | Admitting: Family Medicine

## 2019-08-10 NOTE — Telephone Encounter (Signed)
OTC Miralax, colace, glycerin suppositiory.  Fruit juice.  Samples are still up front.

## 2019-08-10 NOTE — Telephone Encounter (Signed)
Pt states she has some questions regarding her labs and new symptoms she has been having.

## 2019-08-10 NOTE — Telephone Encounter (Signed)
Pt aware of provider feedback and voiced understanding. 

## 2019-08-10 NOTE — Telephone Encounter (Signed)
Spoke with pt and reviewed results with her. She is concerned about her constipation and her stomach cramps. Pt states she has always had some trouble with constipation and hasn't been able to stop by the office to get the samples of Trulance yet. Is there anything else pt should do or try?

## 2019-10-08 ENCOUNTER — Ambulatory Visit (INDEPENDENT_AMBULATORY_CARE_PROVIDER_SITE_OTHER): Payer: BC Managed Care – PPO | Admitting: Family

## 2019-10-08 ENCOUNTER — Encounter: Payer: Self-pay | Admitting: Family

## 2019-10-08 DIAGNOSIS — R399 Unspecified symptoms and signs involving the genitourinary system: Secondary | ICD-10-CM

## 2019-10-08 LAB — URINALYSIS, COMPLETE
Bilirubin, UA: NEGATIVE
Glucose, UA: NEGATIVE
Ketones, UA: NEGATIVE
Leukocytes,UA: NEGATIVE
Nitrite, UA: NEGATIVE
Protein,UA: NEGATIVE
RBC, UA: NEGATIVE
Specific Gravity, UA: 1.015 (ref 1.005–1.030)
Urobilinogen, Ur: 0.2 mg/dL (ref 0.2–1.0)
pH, UA: 6 (ref 5.0–7.5)

## 2019-10-08 LAB — MICROSCOPIC EXAMINATION
Bacteria, UA: NONE SEEN
RBC, Urine: NONE SEEN /hpf (ref 0–2)
WBC, UA: NONE SEEN /hpf (ref 0–5)

## 2019-10-08 NOTE — Addendum Note (Signed)
Addended by: Verlin Dike on: 10/08/2019 03:28 PM   Modules accepted: Orders

## 2019-10-08 NOTE — Progress Notes (Signed)
   Virtual Visit via telephone Note Due to COVID-19 pandemic this visit was conducted virtually. This visit type was conducted due to national recommendations for restrictions regarding the COVID-19 Pandemic (e.g. social distancing, sheltering in place) in an effort to limit this patient's exposure and mitigate transmission in our community. All issues noted in this document were discussed and addressed.  A physical exam was not performed with this format.  I connected with Ruth Terry on 10/08/19 at 2:13pm  by telephone and verified that I am speaking with the correct person using two identifiers. Ruth Terry is currently located at car and no one is currently with her during visit. The provider, Evelina Dun, FNP is located in their office at time of visit.  I discussed the limitations, risks, security and privacy concerns of performing an evaluation and management service by telephone and the availability of in person appointments. I also discussed with the patient that there may be a patient responsible charge related to this service. The patient expressed understanding and agreed to proceed.   History and Present Illness:  Abdominal Pain This is a new problem. The current episode started in the past 7 days. The problem occurs intermittently. The pain is located in the right flank and RLQ. The pain is at a severity of 6/10. The pain is mild. Quality: throbbing. Associated symptoms include dysuria and frequency. Pertinent negatives include no belching, constipation, hematuria, nausea or vomiting. She has tried acetaminophen (increase fluids) for the symptoms.      Review of Systems  Gastrointestinal: Positive for abdominal pain. Negative for constipation, nausea and vomiting.  Genitourinary: Positive for dysuria and frequency. Negative for hematuria.     Observations/Objective: No SOB or distress noted   Assessment and Plan: 1. UTI symptoms Force fluids AZO over the counter  X2 days RTO if symptoms worsen or do not improve  Culture pending - Urinalysis, Complete; Future - Urine Culture; Future    I discussed the assessment and treatment plan with the patient. The patient was provided an opportunity to ask questions and all were answered. The patient agreed with the plan and demonstrated an understanding of the instructions.   The patient was advised to call back or seek an in-person evaluation if the symptoms worsen or if the condition fails to improve as anticipated.  The above assessment and management plan was discussed with the patient. The patient verbalized understanding of and has agreed to the management plan. Patient is aware to call the clinic if symptoms persist or worsen. Patient is aware when to return to the clinic for a follow-up visit. Patient educated on when it is appropriate to go to the emergency department.   Time call ended:  2:20 pm   I provided 7 minutes of non-face-to-face time during this encounter.    Evelina Dun, FNP

## 2019-10-10 LAB — URINE CULTURE

## 2019-10-11 ENCOUNTER — Telehealth: Payer: Self-pay

## 2019-10-11 NOTE — Telephone Encounter (Signed)
Patient aware and verbalized understanding. °

## 2019-10-16 ENCOUNTER — Encounter: Payer: Self-pay | Admitting: Family Medicine

## 2019-10-16 ENCOUNTER — Ambulatory Visit (INDEPENDENT_AMBULATORY_CARE_PROVIDER_SITE_OTHER): Payer: BC Managed Care – PPO | Admitting: Family Medicine

## 2019-10-16 ENCOUNTER — Other Ambulatory Visit: Payer: Self-pay

## 2019-10-16 ENCOUNTER — Ambulatory Visit: Payer: BC Managed Care – PPO

## 2019-10-16 ENCOUNTER — Telehealth: Payer: Self-pay | Admitting: *Deleted

## 2019-10-16 VITALS — BP 112/65 | Temp 97.9°F | Ht 60.0 in | Wt 107.0 lb

## 2019-10-16 DIAGNOSIS — R5383 Other fatigue: Secondary | ICD-10-CM | POA: Diagnosis not present

## 2019-10-16 DIAGNOSIS — K580 Irritable bowel syndrome with diarrhea: Secondary | ICD-10-CM | POA: Diagnosis not present

## 2019-10-16 DIAGNOSIS — M5416 Radiculopathy, lumbar region: Secondary | ICD-10-CM | POA: Diagnosis not present

## 2019-10-16 MED ORDER — RIFAXIMIN 550 MG PO TABS: 550.0000 mg | ORAL_TABLET | Freq: Three times a day (TID) | ORAL | 0 refills | Status: AC

## 2019-10-16 MED ORDER — LORAZEPAM 0.5 MG PO TABS: ORAL_TABLET | ORAL | 0 refills | Status: DC

## 2019-10-16 NOTE — Progress Notes (Signed)
Subjective: CC: Multiple concerns PCP: [name], DO Ruth Terry is a 64 y.o. female presenting to clinic today for:  1.  Upper back pain Patient with ongoing upper back pain.  She reports that it radiates down to the lower extremities bilaterally.  She has been experiencing some lower extremity weakness as well where she feels like her legs want to give out from underneath her.  She has been having neuropathic pain down the legs.  Unfortunately she could not tolerate the Lyrica due to swelling.  She has stopped that medicine and is simply treating herself with OTC analgesics in the interim.  She does voice quite a bit of concern about this weakness in the legs.  She does admit that she is likely fatigued and exhausted as well as she has been stretched fairly thin between caring for her mother, who has dementia and her spouse who has multiple illnesses as well.  She has been doing home physical therapy for lower extremity/low back pain but this is not helping  2.  IBS-D Patient with increased loose stools.  She did have a lapse in her domperidone but has gone back on the medicine.  She is to be cared for by Dr. Sharlett Iles but is now under the care of Dr. Henrene Pastor.  She has been treated with Xifaxan in the past x2.  Current symptoms are refractory to probiotics and simethicone.  She has quite a bit of bloating that sometimes pushes against her ribs and causes discomfort.   ROS: Per HPI  Allergies  Allergen Reactions  . Chlordiazepoxide-Clidinium Itching  . Ciprofloxacin Itching    Irritates stomach  . Flagyl [Metronidazole]     Severe yeast infection  . Sulfa Drugs Cross Reactors Nausea Only  . Cefdinir Nausea And Vomiting  . Nitrofurantoin Monohyd Macro Rash   Past Medical History:  Diagnosis Date  . Anal fissure   . Chronic constipation   . Diverticulosis 04-20-10   colonoscopy  . Gastroparesis   . GERD (gastroesophageal reflux disease)   . Hemorrhoids   .  Hyperlipidemia   . IBS (irritable bowel syndrome)   . MVP (mitral valve prolapse)   . Polyp, stomach 04-20-10   egd  . Shingles   . UTI (lower urinary tract infection)     Current Outpatient Medications:  .  AMBULATORY NON FORMULARY MEDICATION, Medication Name: Domperidone 10 mg Take 1 tablet at bedtime daily., Disp: 30 tablet, Rfl: 6 .  dexlansoprazole (DEXILANT) 60 MG capsule, 1 tablet daily, Disp: 30 capsule, Rfl: 5 .  fluconazole (DIFLUCAN) 150 MG tablet, Take 150 mg by mouth once. , Disp: , Rfl:  .  linaclotide (LINZESS) 145 MCG CAPS capsule, Take 1 capsule (145 mcg total) by mouth daily before breakfast., Disp: 30 capsule, Rfl: 5 .  pantoprazole (PROTONIX) 40 MG tablet, Take by mouth. , Disp: , Rfl:  .  Probiotic Product (ALIGN) 4 MG CAPS, Take 1 capsule by mouth daily., Disp: , Rfl:  .  psyllium (METAMUCIL) 58.6 % packet, Take 1 packet by mouth daily., Disp: , Rfl:  .  Simethicone (PHAZYME MAXIMUM STRENGTH) 250 MG CAPS, Take 250 mg by mouth 3 (three) times daily. (Patient taking differently: Take 250 mg by mouth 3 (three) times daily as needed (gas relief). ), Disp: 14 capsule, Rfl:  .  valACYclovir (VALTREX) 1000 MG tablet, Take 1 tablet (1,000 mg total) by mouth at bedtime., Disp: 90 tablet, Rfl: 4 .  LORazepam (ATIVAN) 0.5 MG tablet, Take 1  tablet 1 hour before MRI. May repeat 1 time 30 minutes later if needed., Disp: 2 tablet, Rfl: 0 .  pregabalin (LYRICA) 75 MG capsule, Take 1 capsule (75 mg total) by mouth at bedtime for 7 days, THEN 1 capsule (75 mg total) 2 (two) times daily., Disp: 60 capsule, Rfl: 0 .  rifaximin (XIFAXAN) 550 MG TABS tablet, Take 1 tablet (550 mg total) by mouth 3 (three) times daily for 14 days., Disp: 42 tablet, Rfl: 0 Social History   Socioeconomic History  . Marital status: Married    Spouse name: Not on file  . Number of children: 0  . Years of education: Not on file  . Highest education level: Not on file  Occupational History  . Occupation:  Mudlogger    Comment: Economist: [employer]  Tobacco Use  . Smoking status: Former Smoker    Packs/day: 0.50    Years: 6.00    Pack years: 3.00    Types: Cigarettes  . Smokeless tobacco: Never Used  . Tobacco comment: USE SMOKELESS CIGARETTES  Vaping Use  . Vaping Use: Never used  Substance and Sexual Activity  . Alcohol use: Yes    Alcohol/week: 0.0 standard drinks    Comment: 1 a week, wine  . Drug use: No  . Sexual activity: Not on file  Other Topics Concern  . Not on file  Social History Narrative  . Not on file   Social Determinants of Health   Financial Resource Strain:   . Difficulty of Paying Living Expenses: Not on file  Food Insecurity:   . Worried About Charity fundraiser in the Last Year: Not on file  . Ran Out of Food in the Last Year: Not on file  Transportation Needs:   . Lack of Transportation (Medical): Not on file  . Lack of Transportation (Non-Medical): Not on file  Physical Activity:   . Days of Exercise per Week: Not on file  . Minutes of Exercise per Session: Not on file  Stress:   . Feeling of Stress : Not on file  Social Connections:   . Frequency of Communication with Friends and Family: Not on file  . Frequency of Social Gatherings with Friends and Family: Not on file  . Attends Religious Services: Not on file  . Active Member of Clubs or Organizations: Not on file  . Attends Archivist Meetings: Not on file  . Marital Status: Not on file  Intimate Partner Violence:   . Fear of Current or Ex-Partner: Not on file  . Emotionally Abused: Not on file  . Physically Abused: Not on file  . Sexually Abused: Not on file   Family History  Problem Relation Age of Onset  . Hypertension Mother   . Diabetes Father   . Heart attack Father        Age 26    Objective: Office vital signs reviewed. BP 112/65   Temp 97.9 F (36.6 C)   Ht 5' (1.524 m)   Wt 107 lb (48.5 kg)   SpO2 97%    BMI 20.90 kg/m   Physical Examination:  General: Awake, alert, No acute distress HEENT: Normal.  Sclera white Cardio: regular rate and rhythm, S1S2 heard, no murmurs appreciated Pulm: clear to auscultation bilaterally, no wheezes, rhonchi or rales; normal work of breathing on room air GI: Full feeling but soft, nontender.  No appreciable hepatomegaly or splenomegaly Extremities: warm, well perfused,  No edema, cyanosis or clubbing; +2 pulses bilaterally MSK: Normal gait and hunched station.  Increased thoracic kyphosis noted. Skin: dry; intact; no rashes or lesions Neuro: 4/5 hip flexor strength bilaterally.  5/5 lower extremity strength otherwise. light touch sensation grossly intact  Assessment/ Plan: 64 y.o. female   1. Lumbar radiculopathy Concern for impingement of the nerves, particularly given progressive lower extremity weakness and neuralgia.  MRI of the lumbar spine has been ordered.  Small quantity of Ativan given for the procedure as she is claustrophobic.  Her upper back pain is more likely to do with the kyphosis.  With the exception of physical therapy am not sure what we could do to improve that upper back pain.  She unfortunately is not able to see physical therapist due to her multiple obligations.  - MR Lumbar Spine Wo Contrast; Future  2. Irritable bowel syndrome with diarrhea We will treat with Xifaxan for 2 weeks as per recommendations 3 times daily.  I have given her 2 days worth of samples and also given her a voucher to use.  She will follow-up as needed with her gastroenterologist for ongoing needs. - rifaximin (XIFAXAN) 550 MG TABS tablet; Take 1 tablet (550 mg total) by mouth 3 (three) times daily for 14 days.  Dispense: 42 tablet; Refill: 0  3. Fatigue, unspecified type Likely due to stressors at home.  However, may be also impacted by the diarrhea that she has been experiencing.  We will continue to follow closely.   The Narcotic Database has been reviewed.   There were no red flags.     Orders Placed This Encounter  Procedures  . MR Lumbar Spine Wo Contrast    Standing Status:   Future    Standing Expiration Date:   10/15/2020    Order Specific Question:   What is the patient's sedation requirement?    Answer:   Anti-anxiety    Order Specific Question:   Does the patient have a pacemaker or implanted devices?    Answer:   No    Order Specific Question:   Preferred imaging location?    Answer:   Internal   Meds ordered this encounter  Medications  . rifaximin (XIFAXAN) 550 MG TABS tablet    Sig: Take 1 tablet (550 mg total) by mouth 3 (three) times daily for 14 days.    Dispense:  42 tablet    Refill:  0  . LORazepam (ATIVAN) 0.5 MG tablet    Sig: Take 1 tablet 1 hour before MRI. May repeat 1 time 30 minutes later if needed.    Dispense:  2 tablet    Refill:  [place], DO [place] [phone]

## 2019-10-16 NOTE — Telephone Encounter (Signed)
Patient states new antibiotic was prescribed three tabs daily x 14 days, states she has never been on antibiotic that long. She is afraid it will make her constipated. Wants to know if she should take for 7 days instead or does she need the whole course

## 2019-10-16 NOTE — Telephone Encounter (Signed)
Attempted to reach patient but there was no answer.  The recommended dose and duration is exactly as prescribed for IBS with diarrhea and severe bloating, all of which patient is experiencing.  Proceed with prescription as prescribed

## 2019-10-17 ENCOUNTER — Telehealth: Payer: Self-pay

## 2019-10-17 NOTE — Telephone Encounter (Signed)
[facility] (Key: [id number]) Rx #: [id number] Xifaxan 550MG  tablets   Form [employer] [place] [employer] Electronic Request Form (CB)  Created 1 day ago  Sent to Plan 13 minutes ago  Plan Response 13 minutes ago  Submit Clinical Questions 11 minutes ago  Determination Wait for Determination  Please wait for [employer] [place] [employer] cb central to return a determination.

## 2019-10-17 NOTE — Telephone Encounter (Signed)
Pt returned missed call from Dr Lajuana Ripple. Reviewed Dr Lajuana Ripple note with pt regarding antibiotic directions. Pt voiced understanding and said that she would take the antibiotic as directed and if she started experiencing constipation, she would give [place] a call back.

## 2019-10-22 ENCOUNTER — Other Ambulatory Visit: Payer: Self-pay | Admitting: Family Medicine

## 2019-10-22 ENCOUNTER — Telehealth: Payer: Self-pay

## 2019-10-22 DIAGNOSIS — M5416 Radiculopathy, lumbar region: Secondary | ICD-10-CM

## 2019-10-22 NOTE — Telephone Encounter (Signed)
  Incoming Patient Call  10/22/2019  What symptoms do you have? Breaking out on fingers and legs, also issues she discussed with Dr. Darnell Level last week are worse  How long have you been sick? Started breaking out over the weekend   Have you been seen for this problem? 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  [facility]    Patient informed that this information will be sent to the clinical staff for review and that they should receive a follow up call.

## 2019-10-22 NOTE — Telephone Encounter (Signed)
Does not require any medications for hand-foot-and-mouth disease.  This should not be a side effect of the Xifaxan as the Xifaxan stays in the gut primarily.  Hand-foot-and-mouth disease is a limited viral illness that should not last longer than 2 weeks

## 2019-10-22 NOTE — Telephone Encounter (Signed)
Patient aware and verbalizes understanding. 

## 2019-10-22 NOTE — Telephone Encounter (Signed)
Patient started the [facility] Friday night.  States it helped her until last night with her diahrea.  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  Patient states she has one blister on her hand and one on the back of her leg.  Wanting to know if she needs to continue with the Xifaxan ? Does she needs to take anything for the hand foot and mouth? No open appointment.

## 2019-10-23 NOTE — Telephone Encounter (Signed)
Please inform patient

## 2019-10-23 NOTE — Telephone Encounter (Signed)
Request denied: does not meet the definition of medical necessity found in the member's benefit book.

## 2019-10-24 DIAGNOSIS — M2041 Other hammer toe(s) (acquired), right foot: Secondary | ICD-10-CM | POA: Diagnosis not present

## 2019-10-24 DIAGNOSIS — S93331D Other subluxation of right foot, subsequent encounter: Secondary | ICD-10-CM | POA: Diagnosis not present

## 2019-10-24 DIAGNOSIS — S93332D Other subluxation of left foot, subsequent encounter: Secondary | ICD-10-CM | POA: Diagnosis not present

## 2019-10-24 DIAGNOSIS — M2042 Other hammer toe(s) (acquired), left foot: Secondary | ICD-10-CM | POA: Diagnosis not present

## 2019-10-24 NOTE — Telephone Encounter (Signed)
Patient aware.

## 2019-10-29 ENCOUNTER — Telehealth: Payer: Self-pay

## 2019-10-29 ENCOUNTER — Other Ambulatory Visit: Payer: Self-pay

## 2019-10-29 ENCOUNTER — Encounter: Payer: Self-pay | Admitting: Physical Therapy

## 2019-10-29 ENCOUNTER — Ambulatory Visit: Payer: BC Managed Care – PPO | Attending: Family Medicine | Admitting: Physical Therapy

## 2019-10-29 DIAGNOSIS — M5442 Lumbago with sciatica, left side: Secondary | ICD-10-CM | POA: Diagnosis not present

## 2019-10-29 DIAGNOSIS — R293 Abnormal posture: Secondary | ICD-10-CM | POA: Diagnosis not present

## 2019-10-29 DIAGNOSIS — M5441 Lumbago with sciatica, right side: Secondary | ICD-10-CM | POA: Diagnosis not present

## 2019-10-29 DIAGNOSIS — M6281 Muscle weakness (generalized): Secondary | ICD-10-CM | POA: Diagnosis not present

## 2019-10-29 NOTE — Therapy (Signed)
Plan - 10/29/19 2116    Clinical Impression Statement Patient is a 64 year old female who presents to physical therapy with lumbar and thoracic pain, decreased LE MMT, and abnormal posture. Patient denies tenderness to palpation but noted with increased tone in bilateral thoracolumar paraspinals. Patient able to complete 5x sit to stand with no UE support in 11.8 seconds. Patient noted with increased thoracic kyphosis, forward head, rounded shoulders, and decreased lumbar lordosis. Patient and PT discussed POC and HEP to which patient reported understanding. Due to high copay, patient will attend PT 1x per week with emphasis on HEP. Patient would benefit from skilled physical therapy to address deficits and patient's goals.    Personal Factors and Comorbidities Comorbidity 1;Time since onset of injury/illness/exacerbation    Comorbidities neuropathy secondary to shingles    Examination-Activity Limitations Locomotion Level;Stand    Examination-Participation Restrictions Occupation;Cleaning;Meal Prep;Laundry    Stability/Clinical Decision Making Stable/Uncomplicated    Clinical Decision Making Low    Rehab Potential Good    PT Frequency 1x / week    PT Duration 6 weeks    PT Treatment/Interventions ADLs/Self Care Home Management;Cryotherapy;Electrical Stimulation;Moist Heat;Ultrasound;Gait training;Stair training;Functional mobility training;Therapeutic activities;Therapeutic exercise;Balance training;Neuromuscular re-education;Manual techniques;Passive range of motion;Patient/family education    PT Next Visit Plan nustep, core stabilization, LE strengthening and stretching, modalities PRN for pain relief;    PT Home Exercise Plan see patient education section    Consulted and Agree with Plan of Care Patient           Patient will benefit from skilled therapeutic intervention in order to improve the following deficits and impairments:  Abnormal gait, Decreased  range of motion, Decreased activity tolerance, Decreased strength, Pain, Postural dysfunction, Improper body mechanics  Visit Diagnosis: Acute midline low back pain with bilateral sciatica - Plan: PT plan of care cert/re-cert  Muscle weakness (generalized) - Plan: PT plan of care cert/re-cert  Abnormal posture - Plan: PT plan of care cert/re-cert     Problem List Patient Active Problem List   Diagnosis Date Noted  . History of shingles 04/24/2019  . Chronic midline low back pain without sciatica 02/27/2019  . Chronic idiopathic constipation 04/26/2018  . Aortic atherosclerosis ([place]) 03/01/2017  . Rib fracture 12/07/2016  . Gas pain 03/09/2016  . Anal itching 03/09/2016  . Elevated LDL cholesterol level 10/24/2015  . Internal hemorrhoids 05/14/2014  . GERD (gastroesophageal reflux disease) 09/17/2011  . IBS (irritable bowel syndrome) 08/25/2010  . Gastroparesis 06/30/2010  . Non-ulcer dyspepsia 04/03/2010    Ruth Terry, PT, DPT 10/29/2019, 10:09 PM  [name] [address] [place], [place], [address] Phone: [phone]   Fax:  [phone]  Name: Ruth Terry MRN: [id number] Date of Birth: 1955-11-19  [name], [place], [address] Phone: [phone]   Fax:  [phone]  Physical Therapy Evaluation  Patient Details  Name: Ruth Terry MRN: [id number] Date of Birth: [birth year] Referring Provider (PT): Ronnie Doss, DO   Encounter Date: 10/29/2019   PT End of Session - 10/29/19 2115    Visit Number 1    Number of Visits 6    Date for PT Re-Evaluation 12/17/19    Authorization Type BCBS    PT Start Time 1430    PT Stop Time 1518    PT Time Calculation (min) 48 min    Activity Tolerance Patient tolerated treatment well    Behavior During Therapy [facility] for tasks assessed/performed           Past Medical History:  Diagnosis Date  . Anal fissure   . Chronic constipation   . Diverticulosis 04-20-10   colonoscopy  . Gastroparesis   . GERD (gastroesophageal reflux disease)   . Hemorrhoids   . Hyperlipidemia   . IBS (irritable bowel syndrome)   . MVP (mitral valve prolapse)   . Polyp, stomach 04-20-10   egd  . Shingles   . UTI (lower urinary tract infection)     Past Surgical History:  Procedure Laterality Date  . COLONOSCOPY    . ESOPHAGOGASTRODUODENOSCOPY      There were no vitals filed for this visit.    Subjective Assessment - 10/29/19 2058    Subjective COVID-19 screening performed upon arrival. Patient arrives to physical therapy with reports of thoracic and lumbar pain that began about 4 months ago. Patient reports ability to perform all ADLs, home activities and caregiving activities for husband and parents but with pain. Patient also reports weakness in LEs especially with static standing. Patient reports pain at worst as 7/10 that begins in thoracic spine and works its way down to lower lumbar spine. Pain at best rated at 2/10. Patient reports ice helps alleviate pain. Patient's goals are to decrease pain improve movement, improve strength, improve standing tolerance, and improve  caretaking abilities for husband and parents.    Pertinent History neuropathy secondary to shingles.    Limitations Lifting;Walking;House hold activities;Standing    Diagnostic tests x-ray: see imaging tab    Patient Stated Goals decrease pain to return to doing caregiving activities    Currently in Pain? Yes    Pain Score 4     Pain Location Back    Pain Orientation Left;Right;Mid;Lower    Pain Descriptors / Indicators Throbbing    Pain Type Acute pain    Pain Radiating Towards bilateral LEs    Pain Onset More than a month ago    Pain Frequency Constant    Aggravating Factors  movement, bending forward, heat intensifies nerve issue    Pain Relieving Factors ice    Effect of Pain on Daily Activities increased time to perform activities.              [facility] PT Assessment - 10/29/19 0001      Assessment   Medical Diagnosis Lumbar radiculopathy    Referring Provider (PT) Ronnie Doss, DO    Onset Date/Surgical Date --   4 months ago, June 2021   Next MD Visit "in 6 weeks"    Prior Therapy no      Precautions   Precautions None      Restrictions   Weight Bearing Restrictions No      Balance Screen   Has  Plan - 10/29/19 2116    Clinical Impression Statement Patient is a 64 year old female who presents to physical therapy with lumbar and thoracic pain, decreased LE MMT, and abnormal posture. Patient denies tenderness to palpation but noted with increased tone in bilateral thoracolumar paraspinals. Patient able to complete 5x sit to stand with no UE support in 11.8 seconds. Patient noted with increased thoracic kyphosis, forward head, rounded shoulders, and decreased lumbar lordosis. Patient and PT discussed POC and HEP to which patient reported understanding. Due to high copay, patient will attend PT 1x per week with emphasis on HEP. Patient would benefit from skilled physical therapy to address deficits and patient's goals.    Personal Factors and Comorbidities Comorbidity 1;Time since onset of injury/illness/exacerbation    Comorbidities neuropathy secondary to shingles    Examination-Activity Limitations Locomotion Level;Stand    Examination-Participation Restrictions Occupation;Cleaning;Meal Prep;Laundry    Stability/Clinical Decision Making Stable/Uncomplicated    Clinical Decision Making Low    Rehab Potential Good    PT Frequency 1x / week    PT Duration 6 weeks    PT Treatment/Interventions ADLs/Self Care Home Management;Cryotherapy;Electrical Stimulation;Moist Heat;Ultrasound;Gait training;Stair training;Functional mobility training;Therapeutic activities;Therapeutic exercise;Balance training;Neuromuscular re-education;Manual techniques;Passive range of motion;Patient/family education    PT Next Visit Plan nustep, core stabilization, LE strengthening and stretching, modalities PRN for pain relief;    PT Home Exercise Plan see patient education section    Consulted and Agree with Plan of Care Patient           Patient will benefit from skilled therapeutic intervention in order to improve the following deficits and impairments:  Abnormal gait, Decreased  range of motion, Decreased activity tolerance, Decreased strength, Pain, Postural dysfunction, Improper body mechanics  Visit Diagnosis: Acute midline low back pain with bilateral sciatica - Plan: PT plan of care cert/re-cert  Muscle weakness (generalized) - Plan: PT plan of care cert/re-cert  Abnormal posture - Plan: PT plan of care cert/re-cert     Problem List Patient Active Problem List   Diagnosis Date Noted  . History of shingles 04/24/2019  . Chronic midline low back pain without sciatica 02/27/2019  . Chronic idiopathic constipation 04/26/2018  . Aortic atherosclerosis ([place]) 03/01/2017  . Rib fracture 12/07/2016  . Gas pain 03/09/2016  . Anal itching 03/09/2016  . Elevated LDL cholesterol level 10/24/2015  . Internal hemorrhoids 05/14/2014  . GERD (gastroesophageal reflux disease) 09/17/2011  . IBS (irritable bowel syndrome) 08/25/2010  . Gastroparesis 06/30/2010  . Non-ulcer dyspepsia 04/03/2010    Ruth Terry, PT, DPT 10/29/2019, 10:09 PM  [name] [address] [place], [place], [address] Phone: [phone]   Fax:  [phone]  Name: Ruth Terry MRN: [id number] Date of Birth: 1955-11-19

## 2019-10-29 NOTE — Telephone Encounter (Signed)
Patient reports she just has one small place on her thigh.  She will call Dr. Gus Puma office and let them know what is going on also.

## 2019-10-29 NOTE — Telephone Encounter (Signed)
Recommend contacting Dr Marin Comment for advice on eye drop if she feels this caused rash Ok to use benadryl or Claritin. If having shortness of breath, or evidence of anaphylaxis recommend immediate evaluation

## 2019-10-29 NOTE — Telephone Encounter (Signed)
Left message to call back  

## 2019-11-02 ENCOUNTER — Other Ambulatory Visit: Payer: Self-pay

## 2019-11-02 ENCOUNTER — Ambulatory Visit (INDEPENDENT_AMBULATORY_CARE_PROVIDER_SITE_OTHER): Payer: BC Managed Care – PPO

## 2019-11-02 DIAGNOSIS — M810 Age-related osteoporosis without current pathological fracture: Secondary | ICD-10-CM | POA: Diagnosis not present

## 2019-11-02 DIAGNOSIS — Z780 Asymptomatic menopausal state: Secondary | ICD-10-CM | POA: Diagnosis not present

## 2019-11-08 ENCOUNTER — Other Ambulatory Visit: Payer: Self-pay

## 2019-11-08 ENCOUNTER — Other Ambulatory Visit: Payer: Self-pay | Admitting: *Deleted

## 2019-11-08 ENCOUNTER — Telehealth: Payer: Self-pay

## 2019-11-08 ENCOUNTER — Ambulatory Visit: Payer: BC Managed Care – PPO | Attending: Family Medicine | Admitting: Physical Therapy

## 2019-11-08 DIAGNOSIS — M5442 Lumbago with sciatica, left side: Secondary | ICD-10-CM | POA: Insufficient documentation

## 2019-11-08 DIAGNOSIS — M6281 Muscle weakness (generalized): Secondary | ICD-10-CM | POA: Diagnosis not present

## 2019-11-08 DIAGNOSIS — R293 Abnormal posture: Secondary | ICD-10-CM

## 2019-11-08 DIAGNOSIS — M810 Age-related osteoporosis without current pathological fracture: Secondary | ICD-10-CM

## 2019-11-08 DIAGNOSIS — M5441 Lumbago with sciatica, right side: Secondary | ICD-10-CM | POA: Diagnosis not present

## 2019-11-08 NOTE — Telephone Encounter (Signed)
Ruth Norlander, DO  11/02/2019  5:33 PM EDT     Patient's DEXA scan shows new onset osteoporosis.  I highly recommend that she follow-up for discussion of osteoporotic treatment options.  May see [name] if I do not have anything soon.  Also would like to get a vitamin D level on this patient so that we can safely start treatment.

## 2019-11-08 NOTE — Therapy (Signed)
[facility] [place], [place], [address] Phone: [phone]   Fax:  [phone]  Physical Therapy Treatment  Patient Details  Name: Ruth Terry MRN: [id number] Date of Birth: 01/18/1955 Referring Provider (PT): Ronnie Doss, DO   Encounter Date: 11/08/2019   PT End of Session - 11/08/19 1436    Visit Number 2    Number of Visits 6    Date for PT Re-Evaluation 12/17/19    Authorization Type BCBS    PT Start Time 1779    PT Stop Time 1502   requested to leave early   PT Time Calculation (min) 30 min    Activity Tolerance Patient tolerated treatment well    Behavior During Therapy [facility] for tasks assessed/performed           Past Medical History:  Diagnosis Date  . Anal fissure   . Chronic constipation   . Diverticulosis 04-20-10   colonoscopy  . Gastroparesis   . GERD (gastroesophageal reflux disease)   . Hemorrhoids   . Hyperlipidemia   . IBS (irritable bowel syndrome)   . MVP (mitral valve prolapse)   . Polyp, stomach 04-20-10   egd  . Shingles   . UTI (lower urinary tract infection)     Past Surgical History:  Procedure Laterality Date  . COLONOSCOPY    . ESOPHAGOGASTRODUODENOSCOPY      There were no vitals filed for this visit.   Subjective Assessment - 11/08/19 1437    Subjective COVID-19 screening performed upon arrival. Patient reports having a new diagnosis of osteoporosis. States pain is elevated due to cold weather.    Pertinent History neuropathy secondary to shingles.    Limitations Lifting;Walking;House hold activities;Standing    Diagnostic tests x-ray: see imaging tab    Patient Stated Goals decrease pain to return to doing caregiving activities    Currently in Pain? Yes    Pain Score 7     Pain Location Back    Pain Descriptors / Indicators Throbbing    Pain Type Acute pain    Pain Onset More than a month ago    Pain Frequency Constant              [facility] PT Assessment -  11/08/19 0001      Assessment   Medical Diagnosis Lumbar radiculopathy    Referring Provider (PT) Ronnie Doss, DO    Next MD Visit "in 6 weeks"    Prior Therapy no      Precautions   Precautions None      Restrictions   Weight Bearing Restrictions No                         [facility] Adult PT Treatment/Exercise - 11/08/19 0001      Exercises   Exercises Lumbar;Knee/Hip      Lumbar Exercises: Aerobic   Nustep Level 2 x8 mins      Lumbar Exercises: Seated   Other Seated Lumbar Exercises Scapular retractions, yellow theraband 2x10      Knee/Hip Exercises: Seated   Marching Strengthening;Both;2 sets;10 reps    Marching Limitations yellow theraband    Hamstring Curl Strengthening;Both;2 sets;10 reps    Hamstring Limitations yellow theraband                  PT Education - 11/08/19 1511    Education Details Living with osteoporosis, household activities    Person(s) Educated Patient  Fax:  [phone]  Name: Ruth Terry MRN: [id number] Date of Birth: May 26, 1955  Fax:  [phone]  Name: Ruth Terry MRN: [id number] Date of Birth: May 26, 1955

## 2019-11-08 NOTE — Telephone Encounter (Signed)
Patient aware, coming Monday for labs and next Friday for visit with [name] to discuss tx options.

## 2019-11-12 ENCOUNTER — Other Ambulatory Visit: Payer: Self-pay

## 2019-11-12 ENCOUNTER — Other Ambulatory Visit: Payer: BC Managed Care – PPO

## 2019-11-12 ENCOUNTER — Ambulatory Visit: Payer: BC Managed Care – PPO | Admitting: Physical Therapy

## 2019-11-12 DIAGNOSIS — M810 Age-related osteoporosis without current pathological fracture: Secondary | ICD-10-CM | POA: Diagnosis not present

## 2019-11-13 ENCOUNTER — Telehealth: Payer: Self-pay

## 2019-11-13 ENCOUNTER — Other Ambulatory Visit: Payer: Self-pay | Admitting: Family Medicine

## 2019-11-13 LAB — VITAMIN D 25 HYDROXY (VIT D DEFICIENCY, FRACTURES): Vit D, 25-Hydroxy: 15.3 ng/mL — ABNORMAL LOW (ref 30.0–100.0)

## 2019-11-13 MED ORDER — VITAMIN D (ERGOCALCIFEROL) 1.25 MG (50000 UNIT) PO CAPS
50000.0000 [IU] | ORAL_CAPSULE | ORAL | 0 refills | Status: DC
Start: 2019-11-13 — End: 2019-11-16

## 2019-11-13 NOTE — Telephone Encounter (Signed)
The plan is to have her see her GI dr if no improvement.  Please see previous OV note.

## 2019-11-13 NOTE — Telephone Encounter (Signed)
Patient aware and verbalizes understanding. 

## 2019-11-16 ENCOUNTER — Ambulatory Visit (INDEPENDENT_AMBULATORY_CARE_PROVIDER_SITE_OTHER): Payer: BC Managed Care – PPO | Admitting: Pharmacist

## 2019-11-16 ENCOUNTER — Ambulatory Visit: Payer: BC Managed Care – PPO | Admitting: Pharmacist

## 2019-11-16 ENCOUNTER — Other Ambulatory Visit: Payer: Self-pay

## 2019-11-16 DIAGNOSIS — E119 Type 2 diabetes mellitus without complications: Secondary | ICD-10-CM

## 2019-11-16 MED ORDER — VITAMIN D (ERGOCALCIFEROL) 1.25 MG (50000 UNIT) PO CAPS: 50000.0000 [IU] | ORAL_CAPSULE | ORAL | 0 refills | Status: DC

## 2019-11-16 MED ORDER — VITAMIN D (ERGOCALCIFEROL) 1.25 MG (50000 UNIT) PO CAPS: 50000.0000 [IU] | ORAL_CAPSULE | ORAL | 0 refills | Status: AC

## 2019-11-16 NOTE — Progress Notes (Signed)
11/16/2019 Name: Ruth Terry MRN: [id number] DOB: 04-07-1955   S: 40 yoF Presents for osteoporosis evaluation, education, and management Current Height:  5'      Max Lifetime Height:  5' Current Weight:    107lbs     Ethnicity:Caucasian  BP:   112/65    HR:   75      HPI: Does pt already have a diagnosis of:  Osteopenia?  Yes Osteoporosis?  Yes  Back Pain?  Yes       Kyphosis?  No Prior fracture?  No Med(s) for Osteoporosis/Osteopenia:  n/a Med(s) previously tried for Osteoporosis/Osteopenia:  n/a                                                             PMH: Age at menopause:  61 Hysterectomy?  No Oophorectomy?  No HRT? No Steroid Use?  No Thyroid med?  No History of cancer?  No History of digestive disorders (ie Crohn's)?  Yes GERD, on PPI Current or previous eating disorders?  No Last Vitamin D Result:  15.3* on 11/12/19 Last GFR Result:  89 on 08/06/19   FH/SH: Family history of osteoporosis?  No Parent with history of hip fracture?  No Family history of breast cancer?  No Exercise?  Yes actively participating in PT Smoking?  Yes vapes only Alcohol?  Yes rare    Calcium Assessment Calcium Intake  # of servings/day  Calcium mg  Milk (8 oz) 0  x  300  = 0  Yogurt (4 oz) 0 x  200 = 0  Cheese (1 oz) 0 x  200 = 0  Other Calcium sources   250mg   Ca supplement 0 = 0   Estimated calcium intake per day 250   FRAX 10 year estimate: FRACTURE RISK: INCREASED.  FRAX: World Health Organization FRAX assessment of absolute fracture risk is not calculated for this patient because the patient has osteoporosis.  Assessment: AP LUMBAR SPINE (L2-L4) Bone Mineral Density (BMD):  0.755 g/cm2 Young Adult T-Score:  -3.7 Z-Score:  -1.8  RIGHT FEMUR NECK Bone Mineral Density (BMD):  0.747 g/cm2 Young Adult T-Score: -2.1 Z-Score:  -0.4  Recommendations: 1.  Consider Prolia based on severe GERD.  Will explore patient's insurance benefits, etc to determine if  this will be a cost effective medication. RX sent to PCP for approval 2.  recommend calcium 1200mg  daily through supplementation or diet.  3.  recommend weight bearing exercise - 30 minutes at least 4 days per week.   4.  Counseled and educated about fall risk and prevention.  Recheck DEXA:  2 years  Time spent counseling patient:  25 minutes   Kieth Brightly, PharmD, BCPS Clinical Pharmacist, [facility]  II Phone [phone]

## 2019-11-19 ENCOUNTER — Encounter: Payer: Self-pay | Admitting: Pharmacist

## 2019-11-19 ENCOUNTER — Telehealth: Payer: Self-pay | Admitting: Pharmacist

## 2019-11-19 NOTE — Telephone Encounter (Signed)
   New start prolia  RX sent to PCP for cosign on 11/16/19  Patient eager to start Prolia before the end of year bc insurance will switch.  I let her know this process is not a quick process  Thank you! Just FYI

## 2019-11-21 ENCOUNTER — Telehealth: Payer: Self-pay

## 2019-11-21 NOTE — Telephone Encounter (Signed)
RETURNED CALL TO PATIENT WITH NO ANSWER  LEFT VM

## 2019-11-22 ENCOUNTER — Other Ambulatory Visit: Payer: Self-pay

## 2019-11-22 ENCOUNTER — Ambulatory Visit: Payer: BC Managed Care – PPO | Admitting: Physical Therapy

## 2019-11-22 DIAGNOSIS — M5441 Lumbago with sciatica, right side: Secondary | ICD-10-CM

## 2019-11-22 DIAGNOSIS — R293 Abnormal posture: Secondary | ICD-10-CM | POA: Diagnosis not present

## 2019-11-22 DIAGNOSIS — M5442 Lumbago with sciatica, left side: Secondary | ICD-10-CM | POA: Diagnosis not present

## 2019-11-22 DIAGNOSIS — M6281 Muscle weakness (generalized): Secondary | ICD-10-CM | POA: Diagnosis not present

## 2019-11-22 NOTE — Therapy (Signed)
[facility] [place], [place], [address] Phone: [phone]   Fax:  [phone]  Physical Therapy Treatment  Patient Details  Name: Ruth Terry MRN: [id number] Date of Birth: 07-03-1955 Referring Provider (PT): Ronnie Doss, DO   Encounter Date: 11/22/2019   PT End of Session - 11/22/19 1354    Visit Number 3    Number of Visits 6    Date for PT Re-Evaluation 12/17/19    Authorization Type BCBS    PT Start Time 1345    PT Stop Time 1430    PT Time Calculation (min) 45 min    Activity Tolerance Patient tolerated treatment well    Behavior During Therapy [facility] for tasks assessed/performed           Past Medical History:  Diagnosis Date  . Anal fissure   . Chronic constipation   . Diverticulosis 04-20-10   colonoscopy  . Gastroparesis   . GERD (gastroesophageal reflux disease)   . Hemorrhoids   . Hyperlipidemia   . IBS (irritable bowel syndrome)   . MVP (mitral valve prolapse)   . Polyp, stomach 04-20-10   egd  . Shingles   . UTI (lower urinary tract infection)     Past Surgical History:  Procedure Laterality Date  . COLONOSCOPY    . ESOPHAGOGASTRODUODENOSCOPY      There were no vitals filed for this visit.   Subjective Assessment - 11/22/19 1442    Subjective COVID-19 screening performed upon arrival. Patient reports pain in lower thoracic spine R>L. Woke up at 4 am secondary to pain.    Pertinent History neuropathy secondary to shingles.    Limitations Lifting;Walking;House hold activities;Standing    Diagnostic tests x-ray: see imaging tab    Patient Stated Goals decrease pain to return to doing caregiving activities    Currently in Pain? Yes   did not provide number on pain scale             [facility] PT Assessment - 11/22/19 0001      Assessment   Medical Diagnosis Lumbar radiculopathy    Referring Provider (PT) Ronnie Doss, DO    Next MD Visit "in 6 weeks"    Prior Therapy no       Precautions   Precautions None      Restrictions   Weight Bearing Restrictions No                         [facility] Adult PT Treatment/Exercise - 11/22/19 0001      Lumbar Exercises: Aerobic   Nustep Level 1 x7 mins      Lumbar Exercises: Seated   Other Seated Lumbar Exercises Scapular retractions x10; bilateral D2 flexion  x10      Lumbar Exercises: Supine   Bridge 10 reps;2 seconds    Straight Leg Raise 10 reps;2 seconds    Straight Leg Raises Limitations bilateral      Lumbar Exercises: Sidelying   Clam Both;10 reps;2 seconds      Modalities   Modalities Cryotherapy;Electrical Stimulation      Cryotherapy   Number Minutes Cryotherapy 10 Minutes    Cryotherapy Location Lumbar Spine    Type of Cryotherapy Ice pack      Electrical Stimulation   Electrical Stimulation Location thoracic spine    Electrical Stimulation Action pre-mod    Electrical Stimulation Parameters 80-150 hz x2 mins Terminated due to feeling uncomfortable    Electrical Stimulation  Abnormal posture     Problem List Patient Active Problem List   Diagnosis Date Noted  . History of shingles 04/24/2019  . Chronic midline low back pain without sciatica 02/27/2019  . Chronic idiopathic constipation 04/26/2018  . Aortic atherosclerosis ([place]) 03/01/2017  . Rib fracture 12/07/2016  . Gas pain 03/09/2016  . Anal itching 03/09/2016  . Elevated LDL cholesterol level 10/24/2015  . Internal hemorrhoids 05/14/2014  . GERD (gastroesophageal reflux disease) 09/17/2011  . IBS (irritable bowel syndrome) 08/25/2010  . Gastroparesis 06/30/2010  . Non-ulcer dyspepsia 04/03/2010    [name], PT, DPT 11/22/2019, 5:01 PM  [facility] [address] [place], [place], [address] Phone: [phone]   Fax:  [phone]  Name: CLEMENCE LENGYEL MRN: [id number] Date of Birth: 1955-03-12  [facility] [place], [place], [address] Phone: [phone]   Fax:  [phone]  Physical Therapy Treatment  Patient Details  Name: Ruth Terry MRN: [id number] Date of Birth: 07-03-1955 Referring Provider (PT): Ronnie Doss, DO   Encounter Date: 11/22/2019   PT End of Session - 11/22/19 1354    Visit Number 3    Number of Visits 6    Date for PT Re-Evaluation 12/17/19    Authorization Type BCBS    PT Start Time 1345    PT Stop Time 1430    PT Time Calculation (min) 45 min    Activity Tolerance Patient tolerated treatment well    Behavior During Therapy [facility] for tasks assessed/performed           Past Medical History:  Diagnosis Date  . Anal fissure   . Chronic constipation   . Diverticulosis 04-20-10   colonoscopy  . Gastroparesis   . GERD (gastroesophageal reflux disease)   . Hemorrhoids   . Hyperlipidemia   . IBS (irritable bowel syndrome)   . MVP (mitral valve prolapse)   . Polyp, stomach 04-20-10   egd  . Shingles   . UTI (lower urinary tract infection)     Past Surgical History:  Procedure Laterality Date  . COLONOSCOPY    . ESOPHAGOGASTRODUODENOSCOPY      There were no vitals filed for this visit.   Subjective Assessment - 11/22/19 1442    Subjective COVID-19 screening performed upon arrival. Patient reports pain in lower thoracic spine R>L. Woke up at 4 am secondary to pain.    Pertinent History neuropathy secondary to shingles.    Limitations Lifting;Walking;House hold activities;Standing    Diagnostic tests x-ray: see imaging tab    Patient Stated Goals decrease pain to return to doing caregiving activities    Currently in Pain? Yes   did not provide number on pain scale             [facility] PT Assessment - 11/22/19 0001      Assessment   Medical Diagnosis Lumbar radiculopathy    Referring Provider (PT) Ronnie Doss, DO    Next MD Visit "in 6 weeks"    Prior Therapy no       Precautions   Precautions None      Restrictions   Weight Bearing Restrictions No                         [facility] Adult PT Treatment/Exercise - 11/22/19 0001      Lumbar Exercises: Aerobic   Nustep Level 1 x7 mins      Lumbar Exercises: Seated   Other Seated Lumbar Exercises Scapular retractions x10; bilateral D2 flexion  x10      Lumbar Exercises: Supine   Bridge 10 reps;2 seconds    Straight Leg Raise 10 reps;2 seconds    Straight Leg Raises Limitations bilateral      Lumbar Exercises: Sidelying   Clam Both;10 reps;2 seconds      Modalities   Modalities Cryotherapy;Electrical Stimulation      Cryotherapy   Number Minutes Cryotherapy 10 Minutes    Cryotherapy Location Lumbar Spine    Type of Cryotherapy Ice pack      Electrical Stimulation   Electrical Stimulation Location thoracic spine    Electrical Stimulation Action pre-mod    Electrical Stimulation Parameters 80-150 hz x2 mins Terminated due to feeling uncomfortable    Electrical Stimulation

## 2019-11-26 ENCOUNTER — Telehealth: Payer: Self-pay

## 2019-11-26 ENCOUNTER — Other Ambulatory Visit: Payer: Self-pay

## 2019-11-26 ENCOUNTER — Ambulatory Visit: Payer: BC Managed Care – PPO | Admitting: Physical Therapy

## 2019-11-26 DIAGNOSIS — M5442 Lumbago with sciatica, left side: Secondary | ICD-10-CM | POA: Diagnosis not present

## 2019-11-26 DIAGNOSIS — R293 Abnormal posture: Secondary | ICD-10-CM | POA: Diagnosis not present

## 2019-11-26 DIAGNOSIS — M5441 Lumbago with sciatica, right side: Secondary | ICD-10-CM

## 2019-11-26 DIAGNOSIS — M6281 Muscle weakness (generalized): Secondary | ICD-10-CM

## 2019-11-26 NOTE — Telephone Encounter (Signed)
Patient asking about Prolia PA I know these things take time, but please update patient as able I told her this would not be an immediate/quick process  Thank you! Ruth Terry

## 2019-11-26 NOTE — Telephone Encounter (Signed)
Duplicate encounter

## 2019-11-26 NOTE — Telephone Encounter (Signed)
Previous call was sent to PA pool- patient aware she will get a call back from person doing PA's.

## 2019-11-26 NOTE — Therapy (Signed)
Chronic midline low back Terry without sciatica 02/27/2019  . Chronic idiopathic constipation 04/26/2018  . Aortic atherosclerosis ([place]) 03/01/2017  . Rib fracture 12/07/2016  . Gas Terry 03/09/2016  . Anal itching 03/09/2016  . Elevated LDL cholesterol level 10/24/2015  . Internal hemorrhoids 05/14/2014  . GERD (gastroesophageal reflux disease) 09/17/2011  . IBS (irritable bowel syndrome) 08/25/2010  . Gastroparesis 06/30/2010  . Non-ulcer dyspepsia 04/03/2010    Ruth Terry 11/26/2019, 3:30 PM  [facility] [place], [place], [address] Phone: [phone]   Fax:  [phone]  Name: Ruth Terry MRN: [id number] Date of Birth: [birth year]  [facility] [place], [place], [address] Phone: [phone]   Fax:  [phone]  Physical Therapy Treatment  Patient Details  Name: CHARLY HUNTON MRN: [id number] Date of Birth: March 04, 1955 Referring Provider (PT): Ronnie Doss, DO   Encounter Date: 11/26/2019   PT End of Session - 11/26/19 1432    Visit Number 4    Number of Visits 6    Date for PT Re-Evaluation 12/17/19    Authorization Type BCBS    PT Start Time 1430    PT Stop Time 1507    PT Time Calculation (min) 37 min    Activity Tolerance Patient tolerated treatment well    Behavior During Therapy [facility] for tasks assessed/performed           Past Medical History:  Diagnosis Date  . Anal fissure   . Chronic constipation   . Diverticulosis 04-20-10   colonoscopy  . Gastroparesis   . GERD (gastroesophageal reflux disease)   . Hemorrhoids   . Hyperlipidemia   . IBS (irritable bowel syndrome)   . MVP (mitral valve prolapse)   . Polyp, stomach 04-20-10   egd  . Shingles   . UTI (lower urinary tract infection)     Past Surgical History:  Procedure Laterality Date  . COLONOSCOPY    . ESOPHAGOGASTRODUODENOSCOPY      There were no vitals filed for this visit.   Subjective Assessment - 11/26/19 1433    Subjective COVID-19 screening performed upon arrival. Patient reports feeling not too bad today 6/10. Reports buying a nustep for home. Is on a time constraint today and is requesting to leave by 3:00 pm    Pertinent History neuropathy secondary to shingles.    Limitations Lifting;Walking;House hold activities;Standing    Diagnostic tests x-ray: see imaging tab    Patient Stated Goals decrease Terry to return to doing caregiving activities    Currently in Terry? Yes    Terry Score 6     Terry Location Back    Terry Orientation Lower    Terry Descriptors / Indicators Discomfort    Terry Type Acute Terry    Terry Onset More than a month ago    Terry Frequency  Constant              [facility] PT Assessment - 11/26/19 0001      Assessment   Medical Diagnosis Lumbar radiculopathy    Referring Provider (PT) Ronnie Doss, DO    Next MD Visit 12/11/2019    Prior Therapy no      Precautions   Precautions None      Restrictions   Weight Bearing Restrictions No      Strength   Right/Left Hip Right;Left    Right Hip Flexion 4/5    Right Hip ABduction 3+/5    Right Hip ADduction 3+/5    Left Hip Flexion 4/5    Left Hip ABduction 3+/5    Left Hip ADduction 3+/5    Right/Left Knee Right;Left    Right Knee Flexion 4+/5    Right Knee Extension 4/5    Left Knee Flexion 4+/5    Left Knee Extension 4/5                         [facility] Adult PT Treatment/Exercise - 11/26/19 0001      Exercises   Exercises Lumbar;Knee/Hip      Lumbar Exercises: Aerobic   Nustep Level 2  Chronic midline low back Terry without sciatica 02/27/2019  . Chronic idiopathic constipation 04/26/2018  . Aortic atherosclerosis ([place]) 03/01/2017  . Rib fracture 12/07/2016  . Gas Terry 03/09/2016  . Anal itching 03/09/2016  . Elevated LDL cholesterol level 10/24/2015  . Internal hemorrhoids 05/14/2014  . GERD (gastroesophageal reflux disease) 09/17/2011  . IBS (irritable bowel syndrome) 08/25/2010  . Gastroparesis 06/30/2010  . Non-ulcer dyspepsia 04/03/2010    Ruth Terry 11/26/2019, 3:30 PM  [facility] [place], [place], [address] Phone: [phone]   Fax:  [phone]  Name: Ruth Terry MRN: [id number] Date of Birth: [birth year]

## 2019-11-27 MED ORDER — DENOSUMAB 60 MG/ML ~~LOC~~ SOSY: 60.0000 mg | PREFILLED_SYRINGE | Freq: Once | SUBCUTANEOUS | 3 refills | Status: AC

## 2019-11-27 NOTE — Addendum Note (Signed)
Addended by: Lottie Dawson D on: 11/27/2019 10:52 AM   Modules accepted: Orders

## 2019-11-27 NOTE — Telephone Encounter (Signed)
Prolia RX sent to PCP for cosign Expect PA to be faxed to [place] for approval of product Patient anxious to start, but I informed her process takes time Send to RN for PA  Thank you! [name]

## 2019-12-03 ENCOUNTER — Other Ambulatory Visit: Payer: Self-pay

## 2019-12-03 ENCOUNTER — Ambulatory Visit: Payer: BC Managed Care – PPO | Admitting: Physical Therapy

## 2019-12-03 ENCOUNTER — Encounter: Payer: Self-pay | Admitting: Physical Therapy

## 2019-12-03 DIAGNOSIS — M5442 Lumbago with sciatica, left side: Secondary | ICD-10-CM | POA: Diagnosis not present

## 2019-12-03 DIAGNOSIS — R293 Abnormal posture: Secondary | ICD-10-CM

## 2019-12-03 DIAGNOSIS — M5441 Lumbago with sciatica, right side: Secondary | ICD-10-CM | POA: Diagnosis not present

## 2019-12-03 DIAGNOSIS — M6281 Muscle weakness (generalized): Secondary | ICD-10-CM

## 2019-12-03 NOTE — Patient Instructions (Signed)
Access Code: [email or web address] URL: [email or web address] Date: 12/03/2019 Prepared by: [name]  Exercises Shoulder PNF D2 Flexion - 1 x daily - 7 x weekly - 2 sets - 10 reps Standing Shoulder Abduction Full Range - 1 x daily - 7 x weekly - 2 sets - 10 reps Wall Push Up - 1 x daily - 7 x weekly - 2 sets - 10 reps Shoulder Internal Rotation with Resistance - 1 x daily - 7 x weekly - 2 sets - 10 reps Shoulder External Rotation with Anchored Resistance - 1 x daily - 7 x weekly - 2 sets - 10 reps

## 2019-12-03 NOTE — Therapy (Signed)
Achieved      PT LONG TERM GOAL #2   Title Patient will demonstrat 4/5 or greater bilateral LE MMT to improve stability during functional tasks.    Time 6    Period Weeks    Status On-going      PT LONG TERM GOAL #3   Title Patient will demonstrate proper body mechanics for household activities and caregiving tasks.    Time 6    Period Weeks    Status On-going      PT LONG TERM GOAL #4   Title Patient will report ability to perform ADLs and caregiving activites with back pain less than or equal to 3/10.    Time 6    Period Weeks    Status On-going                 Plan - 12/03/19 1514    Clinical Impression Statement Patient presents to physical therapy with moderate bilateral mid/low thoracic and rib pain due to an unknown cause. Patient's session guided to postural TEs and strengthening with addition of core draw in for core stabilization. Tactile cuing for proper technique and form with fair carryover for remaining reps. Patient with improvements with pacing with exercises but still with intermittent cuing.  Assess goals next visit then place on hold for MRI and MD follow up.    Personal Factors and Comorbidities Comorbidity 1;Time since onset of injury/illness/exacerbation    Comorbidities neuropathy secondary to shingles, osteoporosis    Examination-Activity Limitations Locomotion Level;Stand    Examination-Participation Restrictions Occupation;Cleaning;Meal Prep;Laundry    Stability/Clinical Decision Making Stable/Uncomplicated    Clinical Decision Making Low    Rehab Potential Good    PT Frequency 1x / week    PT Duration 6 weeks    PT Treatment/Interventions ADLs/Self Care Home Management;Cryotherapy;Electrical Stimulation;Moist Heat;Ultrasound;Gait training;Stair training;Functional mobility training;Therapeutic activities;Therapeutic exercise;Balance training;Neuromuscular re-education;Manual techniques;Passive range of motion;Patient/family education    PT Next Visit Plan nustep, core stabilization, LE strengthening and stretching, modalities PRN for pain relief;    PT Home Exercise Plan see patient education section    Consulted and Agree with Plan of Care Patient           Patient will benefit from skilled therapeutic intervention in order to improve the following deficits and impairments:  Abnormal gait, Decreased range of motion, Decreased activity tolerance, Decreased strength, Pain, Postural dysfunction, Improper body mechanics  Visit Diagnosis: Acute midline low back pain with bilateral sciatica  Muscle weakness (generalized)  Abnormal posture     Problem List Patient Active Problem List   Diagnosis Date Noted  . History of shingles 04/24/2019  . Chronic midline low back pain without sciatica 02/27/2019  . Chronic idiopathic constipation 04/26/2018  . Aortic atherosclerosis ([place]) 03/01/2017  . Rib fracture 12/07/2016  . Gas pain 03/09/2016  . Anal itching 03/09/2016  . Elevated LDL cholesterol level 10/24/2015  . Internal hemorrhoids 05/14/2014  . GERD  (gastroesophageal reflux disease) 09/17/2011  . IBS (irritable bowel syndrome) 08/25/2010  . Gastroparesis 06/30/2010  . Non-ulcer dyspepsia 04/03/2010    [name], PT, DPT 12/03/2019, 3:35 PM  [facility] [place], [place], [address] Phone: [phone]   Fax:  [phone]  Name: Ruth Terry MRN: [id number] Date of Birth: 08-Mar-1955  [facility] [place], [place], [address] Phone: [phone]   Fax:  [phone]  Physical Therapy Treatment  Patient Details  Name: Ruth Terry MRN: [id number] Date of Birth: Aug 12, 1955 Referring Provider (PT): Ronnie Doss, DO   Encounter Date: 12/03/2019   PT End of Session - 12/03/19 1457    Visit Number 5    Number of Visits 6    Date for PT Re-Evaluation 12/17/19    Authorization Type BCBS    PT Start Time 1430    PT Stop Time 1500   requested to leave early   PT Time Calculation (min) 30 min    Activity Tolerance Patient tolerated treatment well    Behavior During Therapy [facility] for tasks assessed/performed           Past Medical History:  Diagnosis Date  . Anal fissure   . Chronic constipation   . Diverticulosis 04-20-10   colonoscopy  . Gastroparesis   . GERD (gastroesophageal reflux disease)   . Hemorrhoids   . Hyperlipidemia   . IBS (irritable bowel syndrome)   . MVP (mitral valve prolapse)   . Polyp, stomach 04-20-10   egd  . Shingles   . UTI (lower urinary tract infection)     Past Surgical History:  Procedure Laterality Date  . COLONOSCOPY    . ESOPHAGOGASTRODUODENOSCOPY      There were no vitals filed for this visit.   Subjective Assessment - 12/03/19 1454    Subjective COVID-19 screening performed upon arrival. Patient arrives with moderate pain but more thoracic/rib pain. Unsure of cause of increase of pain.    Pertinent History neuropathy secondary to shingles.    Limitations Lifting;Walking;House hold activities;Standing    Diagnostic tests x-ray: see imaging tab    Patient Stated Goals decrease pain to return to doing caregiving activities    Currently in Pain? Yes   "moderate"             [facility] PT Assessment - 12/03/19 0001      Assessment   Medical Diagnosis Lumbar radiculopathy    Referring Provider (PT) Ronnie Doss, DO    Next MD Visit 12/11/2019    Prior  Therapy no      Precautions   Precautions None      Restrictions   Weight Bearing Restrictions No                         [facility] Adult PT Treatment/Exercise - 12/03/19 0001      Exercises   Exercises Lumbar;Shoulder      Lumbar Exercises: Aerobic   Nustep Level 4 x10 mins      Shoulder Exercises: Standing   External Rotation Strengthening;Both;20 reps    Theraband Level (Shoulder External Rotation) Level 2 (Red)    Internal Rotation Strengthening;Both;20 reps    Theraband Level (Shoulder Internal Rotation) Level 2 (Red)      Shoulder Exercises: ROM/Strengthening   UBE (Upper Arm Bike) 120 RPM x6 mins (fwd/bwd)    Wall Pushups 20 reps    X to V Arms x20                        PT Long Term Goals - 11/26/19 1433      PT LONG TERM GOAL #1   Title Patient will be independent with HEP    Time 6    Period Weeks    Status

## 2019-12-06 ENCOUNTER — Other Ambulatory Visit: Payer: Self-pay

## 2019-12-06 ENCOUNTER — Encounter: Payer: Self-pay | Admitting: Nurse Practitioner

## 2019-12-06 ENCOUNTER — Ambulatory Visit (INDEPENDENT_AMBULATORY_CARE_PROVIDER_SITE_OTHER): Payer: BC Managed Care – PPO | Admitting: Nurse Practitioner

## 2019-12-06 VITALS — BP 125/72 | HR 90 | Temp 98.7°F | Resp 20 | Ht 60.0 in | Wt 108.0 lb

## 2019-12-06 DIAGNOSIS — L739 Follicular disorder, unspecified: Secondary | ICD-10-CM | POA: Diagnosis not present

## 2019-12-06 MED ORDER — AZITHROMYCIN 250 MG PO TABS: ORAL_TABLET | ORAL | 0 refills | Status: DC

## 2019-12-06 NOTE — Patient Instructions (Signed)

## 2019-12-06 NOTE — Progress Notes (Signed)
Subjective:    Patient ID: Ruth Terry, female    DOB: 08/14/1955, 64 y.o.   MRN: [id number]   Chief Complaint: ? cyst in vaginal area   HPI Patient said she took a shower Tuesday night and was drying off afterwards and felt something in her private area. She says it hurts slightly, but denies burning or itching. Has not gotten bigger since she found it a week ago. She denies any drainage.   Review of Systems  Constitutional: Negative for diaphoresis.  Eyes: Negative for pain.  Respiratory: Negative for shortness of breath.   Cardiovascular: Negative for chest pain, palpitations and leg swelling.  Gastrointestinal: Negative for abdominal pain.  Endocrine: Negative for polydipsia.  Skin: Negative for rash.  Neurological: Negative for dizziness, weakness and headaches.  Hematological: Does not bruise/bleed easily.  All other systems reviewed and are negative.      Objective:   Physical Exam Vitals and nursing note reviewed.  Constitutional:      Appearance: Normal appearance.  Cardiovascular:     Rate and Rhythm: Normal rate and regular rhythm.     Heart sounds: Normal heart sounds.  Pulmonary:     Breath sounds: Normal breath sounds.  Genitourinary:    General: Normal vulva.     Vagina: No vaginal discharge.     Rectum: Normal.     Comments: 2cm erythematous lesion on left labia majoria Skin:    General: Skin is warm.  Neurological:     General: No focal deficit present.     Mental Status: She is alert and oriented to person, place, and time.  Psychiatric:        Mood and Affect: Mood normal.        Behavior: Behavior normal.    BP 125/72    Pulse 90    Temp 98.7 F (37.1 C) (Temporal)    Resp 20    Ht 5' (1.524 m)    Wt 108 lb (49 kg)    SpO2 100%    BMI 21.09 kg/m         Assessment & Plan:  Ruth Terry in today with chief complaint of ? cyst in vaginal area   1. Folliculitis of perineum Left labia Warm compresses Do not pick or  scratch Meds ordered this encounter  Medications   azithromycin (ZITHROMAX Z-PAK) 250 MG tablet    Sig: As directed    Dispense:  6 tablet    Refill:  0    Order Specific Question:   Supervising Provider    Answer:   Nils Pyle [[id number]]   * patient has lots of antibiotic allergies   The above assessment and management plan was discussed with the patient. The patient verbalized understanding of and has agreed to the management plan. Patient is aware to call the clinic if symptoms persist or worsen. Patient is aware when to return to the clinic for a follow-up visit. Patient educated on when it is appropriate to go to the emergency department.   Mary-Margaret Daphine Deutscher, FNP

## 2019-12-10 ENCOUNTER — Other Ambulatory Visit: Payer: Self-pay

## 2019-12-10 ENCOUNTER — Ambulatory Visit: Payer: BC Managed Care – PPO | Attending: Family Medicine | Admitting: Physical Therapy

## 2019-12-10 ENCOUNTER — Encounter: Payer: Self-pay | Admitting: Physical Therapy

## 2019-12-10 ENCOUNTER — Telehealth: Payer: Self-pay | Admitting: Physician Assistant

## 2019-12-10 DIAGNOSIS — R293 Abnormal posture: Secondary | ICD-10-CM

## 2019-12-10 DIAGNOSIS — M5442 Lumbago with sciatica, left side: Secondary | ICD-10-CM | POA: Diagnosis not present

## 2019-12-10 DIAGNOSIS — M6281 Muscle weakness (generalized): Secondary | ICD-10-CM | POA: Diagnosis not present

## 2019-12-10 DIAGNOSIS — M5441 Lumbago with sciatica, right side: Secondary | ICD-10-CM | POA: Diagnosis not present

## 2019-12-10 MED ORDER — AMBULATORY NON FORMULARY MEDICATION
0 refills | Status: DC
Start: 2019-12-10 — End: 2020-03-19

## 2019-12-10 NOTE — Therapy (Signed)
[facility] [place], [place], [address] Phone: [phone]   Fax:  [phone]  Physical Therapy Treatment  Patient Details  Name: Ruth Terry MRN: [id number] Date of Birth: August 01, 1955 Referring Provider (PT): Ronnie Doss, DO   Encounter Date: 12/10/2019   PT End of Session - 12/10/19 1440    Visit Number 6    Number of Visits 6    Date for PT Re-Evaluation 12/17/19    Authorization Type BCBS    PT Start Time 1430    PT Stop Time 1505    PT Time Calculation (min) 35 min    Activity Tolerance Patient tolerated treatment well    Behavior During Therapy [facility] for tasks assessed/performed           Past Medical History:  Diagnosis Date  . Anal fissure   . Chronic constipation   . Diverticulosis 04-20-10   colonoscopy  . Gastroparesis   . GERD (gastroesophageal reflux disease)   . Hemorrhoids   . Hyperlipidemia   . IBS (irritable bowel syndrome)   . MVP (mitral valve prolapse)   . Polyp, stomach 04-20-10   egd  . Shingles   . UTI (lower urinary tract infection)     Past Surgical History:  Procedure Laterality Date  . COLONOSCOPY    . ESOPHAGOGASTRODUODENOSCOPY      There were no vitals filed for this visit.   Subjective Assessment - 12/10/19 1434    Subjective COVID-19 screening performed upon arrival. Patient reports being on antibiotics and feels like legs are "weak and tired"    Pertinent History neuropathy secondary to shingles.    Limitations Lifting;Walking;House hold activities;Standing    Diagnostic tests x-ray: see imaging tab    Patient Stated Goals decrease pain to return to doing caregiving activities    Currently in Pain? Yes    Pain Score 4     Pain Location Back    Pain Orientation Lower    Pain Descriptors / Indicators Discomfort    Pain Type Acute pain    Pain Onset More than a month ago    Pain Frequency Constant              [facility] PT Assessment - 12/10/19 0001       Assessment   Medical Diagnosis Lumbar radiculopathy    Referring Provider (PT) Ronnie Doss, DO    Next MD Visit 12/11/2019    Prior Therapy no      Precautions   Precautions None      Restrictions   Weight Bearing Restrictions No                         [facility] Adult PT Treatment/Exercise - 12/10/19 0001      Therapeutic Activites    Therapeutic Activities Lifting    Lifting 6 lb lift from 18" stool x10 followed by 6" lift from 12" black bolster. x10      Exercises   Exercises Lumbar;Shoulder      Lumbar Exercises: Stretches   Passive Hamstring Stretch Left;Right;3 reps;30 seconds    Passive Hamstring Stretch Limitations seated      Lumbar Exercises: Aerobic   Nustep Level 4 x8 mins      Lumbar Exercises: Seated   Long Arc Quad on Chair Strengthening;Both;2 sets;10 reps    LAQ on Chair Weights (lbs) 2    Other Seated Lumbar Exercises hip flexion 2x10 2#    Other Seated  04/26/2018  . Aortic atherosclerosis ([place]) 03/01/2017  . Rib fracture 12/07/2016  . Gas pain 03/09/2016  . Anal itching 03/09/2016  . Elevated LDL cholesterol level 10/24/2015  . Internal hemorrhoids 05/14/2014  . GERD (gastroesophageal reflux disease) 09/17/2011  . IBS (irritable bowel syndrome) 08/25/2010  . Gastroparesis 06/30/2010  . Non-ulcer dyspepsia 04/03/2010    [name], PT, DPT 12/10/2019, 4:09 PM  [facility] [address] [place], [place], [address] Phone: [phone]   Fax:  [phone]  Name: Ruth Terry MRN: [id number] Date of Birth: [birth year]  04/26/2018  . Aortic atherosclerosis ([place]) 03/01/2017  . Rib fracture 12/07/2016  . Gas pain 03/09/2016  . Anal itching 03/09/2016  . Elevated LDL cholesterol level 10/24/2015  . Internal hemorrhoids 05/14/2014  . GERD (gastroesophageal reflux disease) 09/17/2011  . IBS (irritable bowel syndrome) 08/25/2010  . Gastroparesis 06/30/2010  . Non-ulcer dyspepsia 04/03/2010    [name], PT, DPT 12/10/2019, 4:09 PM  [facility] [address] [place], [place], [address] Phone: [phone]   Fax:  [phone]  Name: Ruth Terry MRN: [id number] Date of Birth: [birth year]

## 2019-12-10 NOTE — Telephone Encounter (Signed)
Pt is requesting a prescription for Domperidone.  Pharmacy: [facility] Fax: [phone]

## 2019-12-10 NOTE — Telephone Encounter (Signed)
Patient's refill has been faxed to the pharmacy

## 2019-12-11 ENCOUNTER — Ambulatory Visit: Payer: BC Managed Care – PPO | Admitting: Family Medicine

## 2019-12-11 ENCOUNTER — Encounter: Payer: Self-pay | Admitting: Family Medicine

## 2019-12-11 VITALS — BP 119/73 | HR 89 | Temp 97.1°F | Ht 60.0 in | Wt 108.6 lb

## 2019-12-11 DIAGNOSIS — M810 Age-related osteoporosis without current pathological fracture: Secondary | ICD-10-CM | POA: Diagnosis not present

## 2019-12-11 DIAGNOSIS — F439 Reaction to severe stress, unspecified: Secondary | ICD-10-CM

## 2019-12-11 DIAGNOSIS — L739 Follicular disorder, unspecified: Secondary | ICD-10-CM | POA: Diagnosis not present

## 2019-12-11 DIAGNOSIS — M5416 Radiculopathy, lumbar region: Secondary | ICD-10-CM

## 2019-12-11 DIAGNOSIS — R4589 Other symptoms and signs involving emotional state: Secondary | ICD-10-CM

## 2019-12-11 DIAGNOSIS — K580 Irritable bowel syndrome with diarrhea: Secondary | ICD-10-CM | POA: Diagnosis not present

## 2019-12-11 DIAGNOSIS — F418 Other specified anxiety disorders: Secondary | ICD-10-CM

## 2019-12-11 MED ORDER — IBANDRONATE SODIUM 150 MG PO TABS: 150.0000 mg | ORAL_TABLET | ORAL | 0 refills | Status: DC

## 2019-12-11 MED ORDER — LORAZEPAM 0.5 MG PO TABS
ORAL_TABLET | ORAL | 0 refills | Status: DC
Start: 2019-12-11 — End: 2020-03-27

## 2019-12-11 MED ORDER — MIRTAZAPINE 7.5 MG PO TABS: 7.5000 mg | ORAL_TABLET | Freq: Every day | ORAL | 0 refills | Status: DC

## 2019-12-11 NOTE — Progress Notes (Signed)
Subjective: CC: Recheck perineal lesion, Follow-up back pain, osteoporosis PCP: Ruth Norlander, DO [id number] [name] is a 64 y.o. female presenting to clinic today for:  1.  Vaginal lesion Patient had a lesion on the left vagina noted about a week and a half ago.  She was evaluated here in the office and was determined to be a folliculitis.  Supportive care was recommended and she was treated with azithromycin as well.  She has completed this antibiotic but notes that there is still a small lesion there.  She just wants to make sure there are no complications.  Denies any shaving of the vaginal area.  No pain.  No drainage or bleeding.  2.  Chronic back pain Patient with ongoing intermittent back pain that does seem slightly better with physical therapy but she has ongoing lower extremity weakness.  She has been in physical therapy for quite some time now and is asking that we try and resubmit the MRI as she continues to have leg weakness, easy fatigability of the lower extremities and sensation of leg buckling.  3.  Anxiety, stress Patient with increased anxiety and stress related to her home environment.  She has multiple ill family members that require quite a bit of her attention and she is constantly on the go.  She reports poor sleep.  She is stressed out because she has been trying to reach her gastroenterologist about her motility medication for her GI tract.  She continues to have intermittent gas that does seem to be somewhat relieved by Gas-X and her constipation medicine but continues to recur.  She does not want to be on chronic medications for this issue.  She just does not feel her normal self and this causes her more anxiety  ROS: Per HPI  Allergies  Allergen Reactions  . Chlordiazepoxide-Clidinium Itching  . Ciprofloxacin Itching    Irritates stomach  . Flagyl [Metronidazole]     Severe yeast infection  . Sulfa Drugs Cross Reactors Nausea Only  . Cefdinir Nausea  And Vomiting  . Nitrofurantoin Monohyd Macro Rash   Past Medical History:  Diagnosis Date  . Anal fissure   . Chronic constipation   . Diverticulosis 04-20-10   colonoscopy  . Gastroparesis   . GERD (gastroesophageal reflux disease)   . Hemorrhoids   . Hyperlipidemia   . IBS (irritable bowel syndrome)   . MVP (mitral valve prolapse)   . Polyp, stomach 04-20-10   egd  . Shingles   . UTI (lower urinary tract infection)     Current Outpatient Medications:  .  AMBULATORY NON FORMULARY MEDICATION, Medication Name: Domperidone 10 mg Take 1 tablet at bedtime daily., Disp: 90 tablet, Rfl: 0 .  dexlansoprazole (DEXILANT) 60 MG capsule, 1 tablet daily, Disp: 30 capsule, Rfl: 5 .  linaclotide (LINZESS) 145 MCG CAPS capsule, Take 1 capsule (145 mcg total) by mouth daily before breakfast., Disp: 30 capsule, Rfl: 5 .  LORazepam (ATIVAN) 0.5 MG tablet, Take 1 tablet 1 hour before MRI. May repeat 1 time 30 minutes later if needed., Disp: 2 tablet, Rfl: 0 .  pantoprazole (PROTONIX) 40 MG tablet, Take by mouth. , Disp: , Rfl:  .  Probiotic Product (ALIGN) 4 MG CAPS, Take 1 capsule by mouth daily., Disp: , Rfl:  .  psyllium (METAMUCIL) 58.6 % packet, Take 1 packet by mouth daily., Disp: , Rfl:  .  Simethicone (PHAZYME MAXIMUM STRENGTH) 250 MG CAPS, Take 250 mg by mouth 3 (three) times daily. (  Patient taking differently: Take 250 mg by mouth 3 (three) times daily as needed (gas relief). ), Disp: 14 capsule, Rfl:  .  valACYclovir (VALTREX) 1000 MG tablet, Take 1 tablet (1,000 mg total) by mouth at bedtime., Disp: 90 tablet, Rfl: 4 .  Vitamin D, Ergocalciferol, (DRISDOL) 1.25 MG (50000 UNIT) CAPS capsule, Take 1 capsule (50,000 Units total) by mouth every 7 (seven) days for 8 doses., Disp: 4 capsule, Rfl: 0 .  azithromycin (ZITHROMAX Z-PAK) 250 MG tablet, As directed (Patient not taking: Reported on 12/11/2019), Disp: 6 tablet, Rfl: 0 Social History   Socioeconomic History  . Marital status: Married     Spouse name: Not on file  . Number of children: 0  . Years of education: Not on file  . Highest education level: Not on file  Occupational History  . Occupation: Mudlogger    Comment: Economist: [employer]  Tobacco Use  . Smoking status: Former Smoker    Packs/day: 0.50    Years: 6.00    Pack years: 3.00    Types: Cigarettes  . Smokeless tobacco: Never Used  . Tobacco comment: USE SMOKELESS CIGARETTES  Vaping Use  . Vaping Use: Never used  Substance and Sexual Activity  . Alcohol use: Yes    Alcohol/week: 0.0 standard drinks    Comment: 1 a week, wine  . Drug use: No  . Sexual activity: Not on file  Other Topics Concern  . Not on file  Social History Narrative  . Not on file   Social Determinants of Health   Financial Resource Strain:   . Difficulty of Paying Living Expenses: Not on file  Food Insecurity:   . Worried About Charity fundraiser in the Last Year: Not on file  . Ran Out of Food in the Last Year: Not on file  Transportation Needs:   . Lack of Transportation (Medical): Not on file  . Lack of Transportation (Non-Medical): Not on file  Physical Activity:   . Days of Exercise per Week: Not on file  . Minutes of Exercise per Session: Not on file  Stress:   . Feeling of Stress : Not on file  Social Connections:   . Frequency of Communication with Friends and Family: Not on file  . Frequency of Social Gatherings with Friends and Family: Not on file  . Attends Religious Services: Not on file  . Active Member of Clubs or Organizations: Not on file  . Attends Archivist Meetings: Not on file  . Marital Status: Not on file  Intimate Partner Violence:   . Fear of Current or Ex-Partner: Not on file  . Emotionally Abused: Not on file  . Physically Abused: Not on file  . Sexually Abused: Not on file   Family History  Problem Relation Age of Onset  . Hypertension Mother   . Diabetes Father   . Heart  attack Father        Age 88    Objective: Office vital signs reviewed. BP 119/73   Pulse 89   Temp (!) 97.1 F (36.2 C)   Ht 5' (1.524 m)   Wt 108 lb 9.6 oz (49.3 kg)   SpO2 99%   BMI 21.21 kg/m   Physical Examination:  General: Awake, alert, thin female, No acute distress HEENT: Normal; sclera white Cardio: regular rate   Pulm: Normal work of breathing on room air MSK: Hunched station.  She has prominent  thoracic kyphosis.  Her hips appear to internally rotate Psych: Mood stable but appears anxious. Skin: Small appreciable inflamed follicle noted along the left labia majora.  No appreciable induration, fluctuance or surrounding erythema  Depression screen [facility] 2/9 12/11/2019 12/06/2019 10/16/2019  Decreased Interest 0 0 0  Down, Depressed, Hopeless 0 0 1  PHQ - 2 Score 0 0 1  Altered sleeping - - 0  Tired, decreased energy - - 2  Change in appetite - - 0  Feeling bad or failure about yourself  - - 0  Trouble concentrating - - 0  Moving slowly or fidgety/restless - - 0  Suicidal thoughts - - 0  PHQ-9 Score - - 3  Difficult doing work/chores - - Not difficult at all  Some recent data might be hidden   GAD 7 : Generalized Anxiety Score 12/11/2019 10/16/2019  Nervous, Anxious, on Edge 3 2  Control/stop worrying 3 0  Worry too much - different things 0 0  Trouble relaxing 1 0  Restless 0 0  Easily annoyed or irritable 0 0  Afraid - awful might happen 0 0  Total GAD 7 Score 7 2  Anxiety Difficulty Not difficult at all -   Assessment/ Plan: 64 y.o. female   Folliculitis of perineum  Lumbar radiculopathy  Irritable bowel syndrome with diarrhea  Osteoporosis without current pathological fracture, unspecified osteoporosis type - Plan: ibandronate (BONIVA) 150 MG tablet  Stress at home - Plan: mirtazapine (REMERON) 7.5 MG tablet  Anxiety about health - Plan: mirtazapine (REMERON) 7.5 MG tablet  The folliculitis is still present but does not appear to be infected  with secondary bacterial infection.  I recommended warm compresses, sitz bath's.  Would avoid squeezing the lesion.  Ongoing lumbar radiculopathy with lower extremity weakness that is refractory to physical therapy.  She is appreciable interning of bilateral hips.  I think an MRI would be beneficial to this patient we will see if we can resubmit this.  Patient diagnosed with osteoporosis and we are currently trying to pursue Prolia.  However, I would like her to be on something whilst we are waiting.  Boniva has been sent.  If insurance does not pay for this particular medication, we could consider an alternative.  I reviewed her chart it appears that her GI doctor sent her medication yesterday.  Given her ongoing anxiety and stress levels I am going to start her on low-dose mirtazapine.  We can try titrating this up to 15 mg in 1 month if she responds well to this.  No orders of the defined types were placed in this encounter.  No orders of the defined types were placed in this encounter.    Ruth Norlander, DO [place] [phone]

## 2019-12-11 NOTE — Patient Instructions (Signed)
   Taking the medicine as directed and not missing any doses is one of the best things you can do to treat your anxiety.  Here are some things to keep in mind:  1) Side effects (stomach upset, some increased anxiety) may happen before you notice a benefit.  These side effects typically go away over time. 2) Changes to your dose of medicine or a change in medication all together is sometimes necessary 3) Most people need to be on medication at least 12 months 4) Many people will notice an improvement within two weeks but the full effect of the medication can take up to 4-6 weeks 5) Stopping the medication when you start feeling better often results in a return of symptoms 6) Never discontinue your medication without contacting a health care professional first.  Some medications require gradual discontinuation/ taper and can make you sick if you stop them abruptly.  If your symptoms worsen or you have thoughts of suicide/homicide, PLEASE SEEK IMMEDIATE MEDICAL ATTENTION.  You may always call:  National Suicide Hotline: 907 823 5350 [place]: 913-694-8345 Crisis Recovery in [place]: 920 086 3657   These are available 24 hours a day, 7 days a week.

## 2019-12-13 DIAGNOSIS — H2513 Age-related nuclear cataract, bilateral: Secondary | ICD-10-CM | POA: Diagnosis not present

## 2019-12-27 ENCOUNTER — Other Ambulatory Visit: Payer: Self-pay | Admitting: Family

## 2019-12-27 ENCOUNTER — Ambulatory Visit (HOSPITAL_COMMUNITY)
Admission: RE | Admit: 2019-12-27 | Discharge: 2019-12-27 | Disposition: A | Discharge status: Home / Self Care | Payer: BC Managed Care – PPO | Source: Ambulatory Visit | Attending: Family Medicine | Admitting: Family Medicine

## 2019-12-27 ENCOUNTER — Other Ambulatory Visit: Payer: Self-pay

## 2019-12-27 DIAGNOSIS — M5416 Radiculopathy, lumbar region: Secondary | ICD-10-CM | POA: Diagnosis not present

## 2019-12-27 DIAGNOSIS — M545 Low back pain, unspecified: Secondary | ICD-10-CM | POA: Diagnosis not present

## 2019-12-27 DIAGNOSIS — K219 Gastro-esophageal reflux disease without esophagitis: Secondary | ICD-10-CM

## 2019-12-27 IMAGING — MR MR LUMBAR SPINE W/O CM
5 series · 33 of 48 positions shown · non-contrast
Comparison: None.

CLINICAL DATA: Progressive lower back pain

EXAM:
MRI LUMBAR SPINE WITHOUT CONTRAST
TECHNIQUE: Multiplanar, multisequence MR imaging of the lumbar spine was
performed. No intravenous contrast was administered.

[Series 5: T2 · sagittal · 4.0mm · 0.81mm/px · 7 of 13 slices shown (1 of 2)]
[im 1/13]
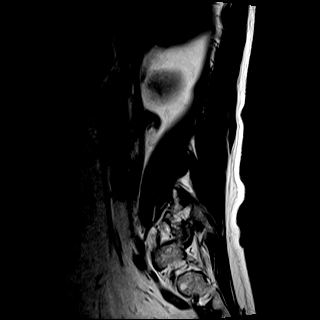
[im 3/13]
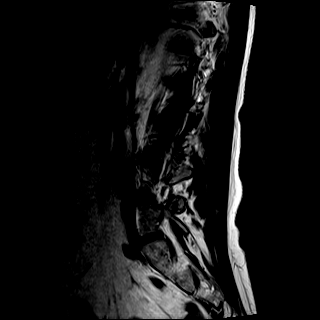
[im 5/13]
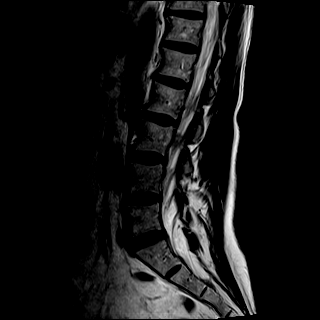
[im 7/13]
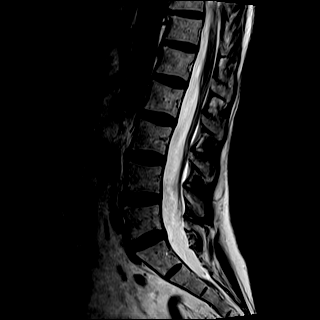
[im 9/13]
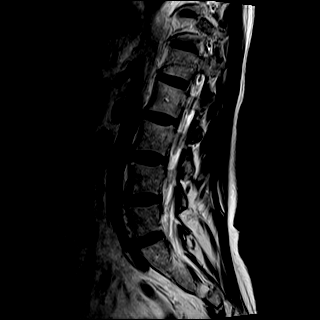
[im 11/13]
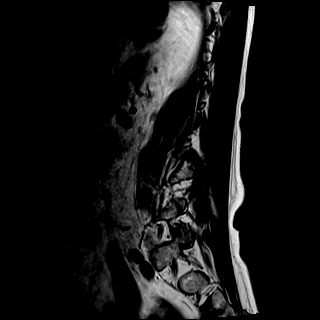
[im 13/13]
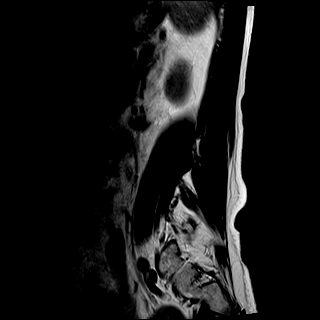

[Series 6: STIR · sagittal · 4.0mm · 0.51mm/px · 4 of 13 slices shown]
[im 1/13]
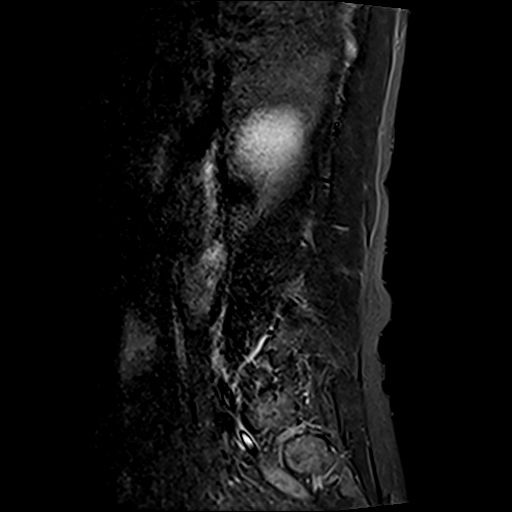
[im 3/13]
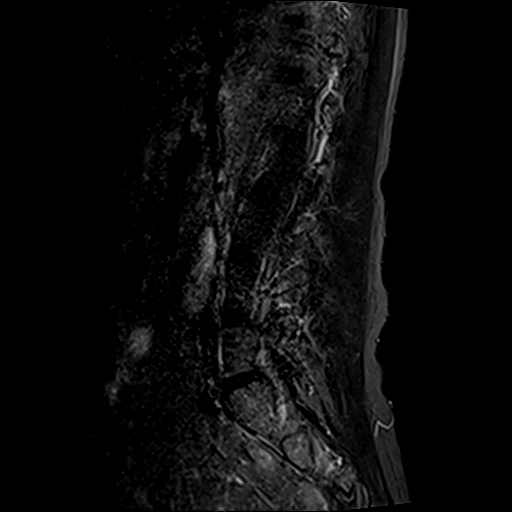
[im 5/13]
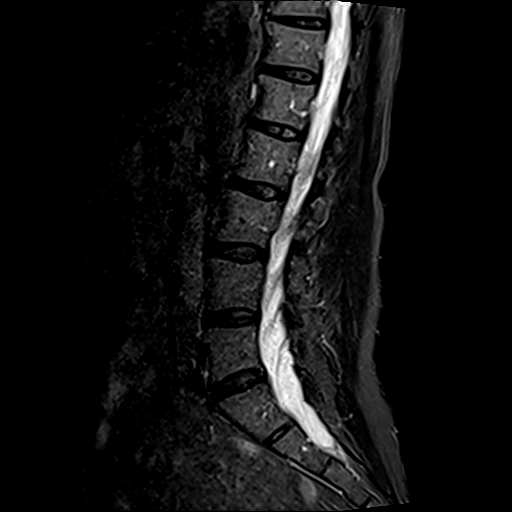
[im 7/13]
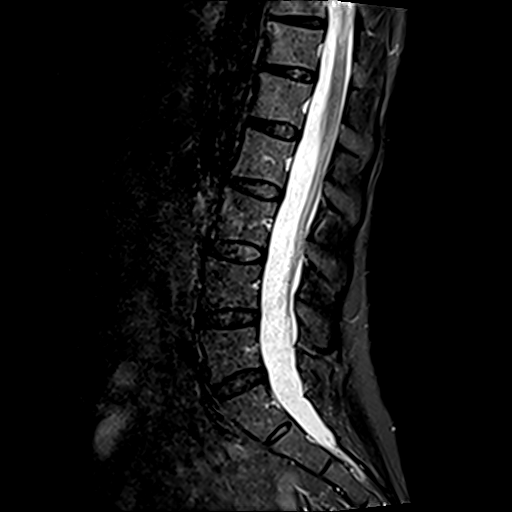

[Series 7: T1 · sagittal · 4.0mm · 1.02mm/px · 6 of 13 slices shown (1 of 2)]
[im 1/13]
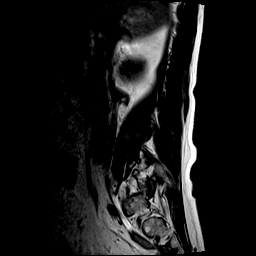
[im 3/13]
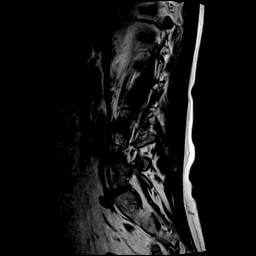
[im 5/13]
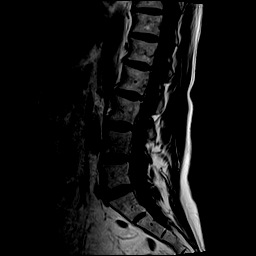
[im 8/13]
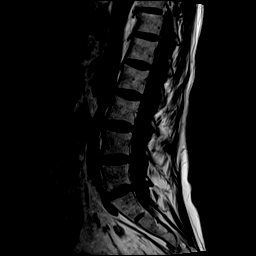
[im 10/13]
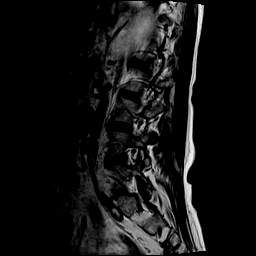
[im 13/13]
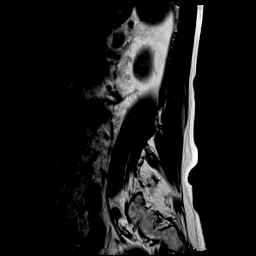

[Series 8: T1 · axial · 4.0mm · 0.35mm/px · z∈[-145,+77]mm · 8 of 29 slices shown (2 of 2)]
[im 1/29]
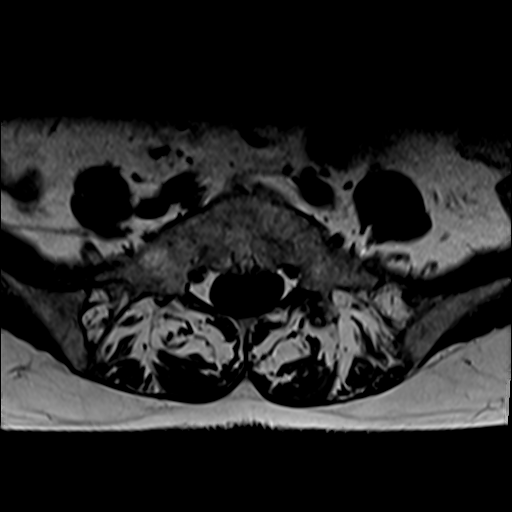
[im 5/29]
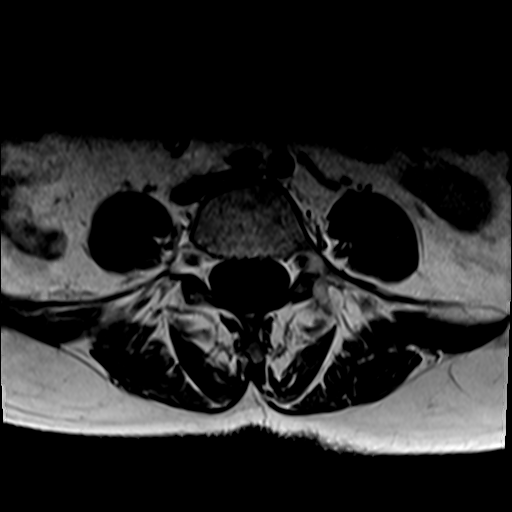
[im 9/29]
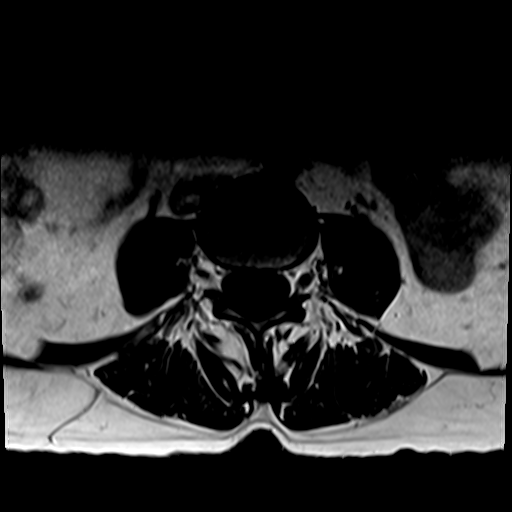
[im 13/29]
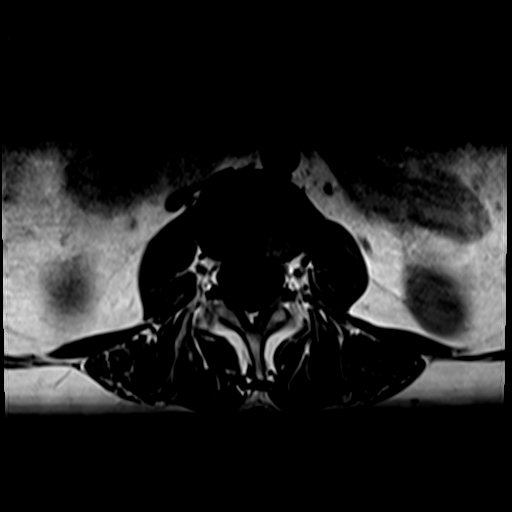
[im 16/29]
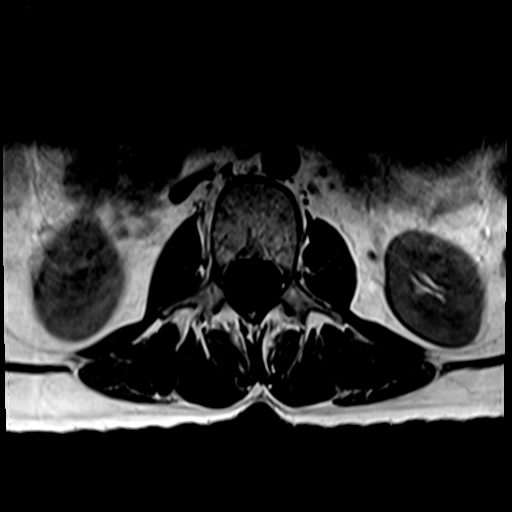
[im 20/29]
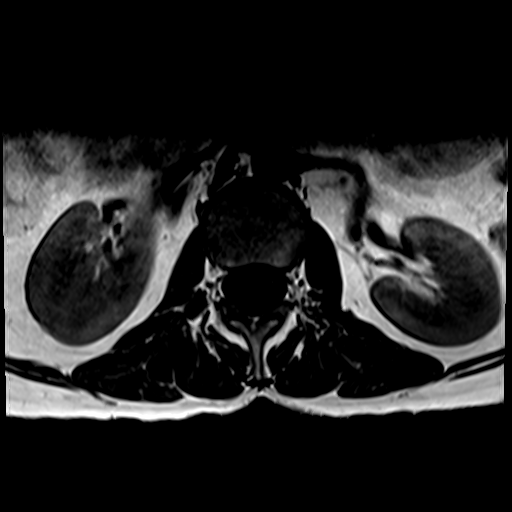
[im 24/29]
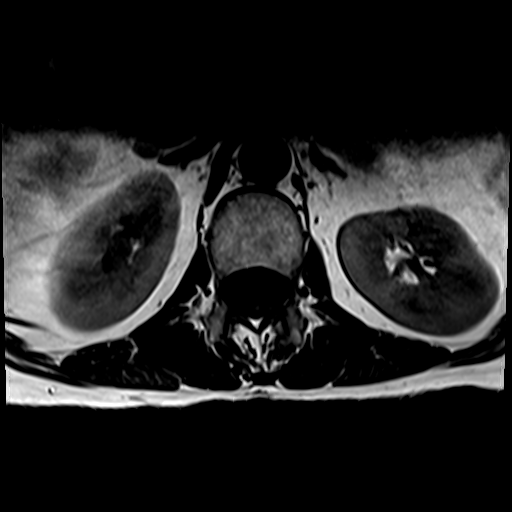
[im 29/29]
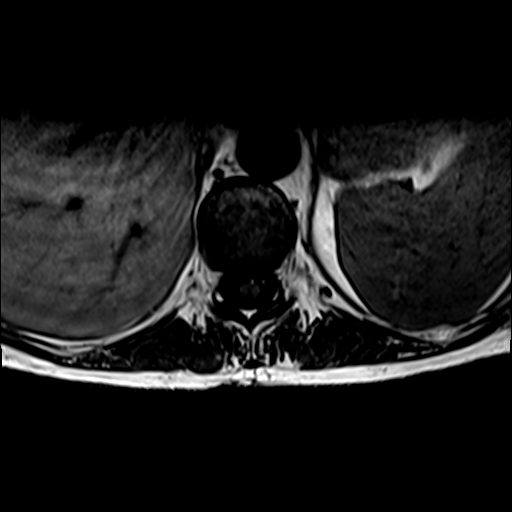

[Series 9: T2 · axial · 4.0mm · 0.70mm/px · z∈[-145,+77]mm · 8 of 29 slices shown (2 of 2)]
[im 1/29]
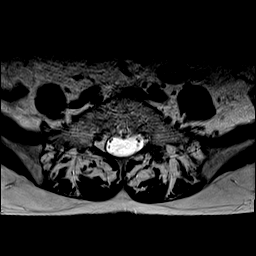
[im 5/29]
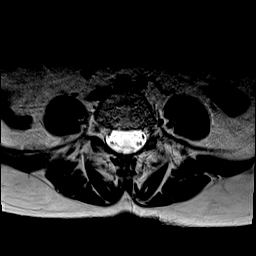
[im 9/29]
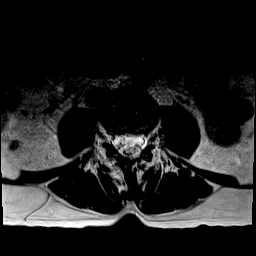
[im 13/29]
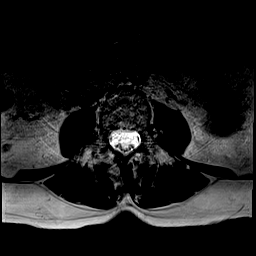
[im 16/29]
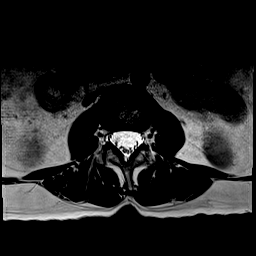
[im 20/29]
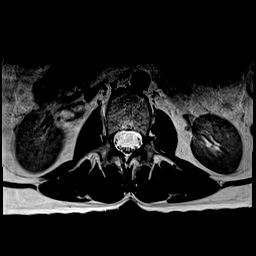
[im 24/29]
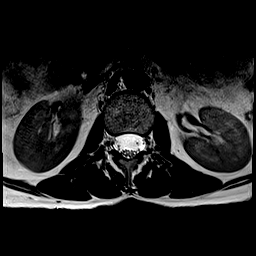
[im 29/29]
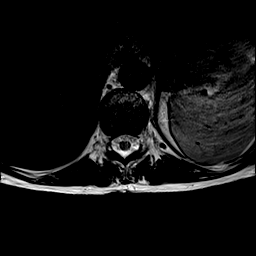

[33 of 48 positions shown; findings below may reference images not displayed]

FINDINGS: Segmentation: There are 5 non-rib bearing lumbar type vertebral
bodies with the last intervertebral disc space labeled as L5-S1.

Alignment:  Normal

Vertebrae: The vertebral body heights are well maintained. No
fracture, marrow edema,or pathologic marrow infiltration.

Conus medullaris and cauda equina: Conus extends to the level. Conus
and cauda equina appear normal.

Paraspinal and other soft tissues: The paraspinal soft tissues and
visualized retroperitoneal structures are unremarkable. The
sacroiliac joints are intact.

Disc levels:

T12-L1:  No significant canal or neural foraminal narrowing.

L1-L2:   No significant canal or neural foraminal narrowing.

L2-L3:   No significant canal or neural foraminal narrowing.

L3-L4:   No significant canal or neural foraminal narrowing.

L4-L5:  No significant canal or neural foraminal narrowing.

L5-S1: There is a minimal broad-based disc bulge with a posterior
annular fissure, however no significant or neural foraminal
narrowing.
IMPRESSION: Mild broad-based disc bulge at L5-S1 with a posterior annular
fissure. No significant canal or neural foraminal narrowing.

## 2019-12-27 MED ORDER — LINACLOTIDE 145 MCG PO CAPS
145.0000 ug | ORAL_CAPSULE | Freq: Every day | ORAL | 0 refills | Status: DC
Start: 2019-12-27 — End: 2020-02-11

## 2019-12-27 NOTE — Telephone Encounter (Signed)
  Prescription Request  12/27/2019  What is the name of the medication or equipment? Dexlansoprazole and Linaclotide   Have you contacted your pharmacy to request a refill? (if applicable) Yes  Which pharmacy would you like this sent to? [facility] in [place]   Patient notified that their request is being sent to the clinical staff for review and that they should receive a response within 2 business days.

## 2019-12-31 ENCOUNTER — Other Ambulatory Visit: Payer: Self-pay | Admitting: Family Medicine

## 2019-12-31 DIAGNOSIS — M5136 Other intervertebral disc degeneration, lumbar region: Secondary | ICD-10-CM

## 2019-12-31 DIAGNOSIS — M5416 Radiculopathy, lumbar region: Secondary | ICD-10-CM

## 2020-01-01 NOTE — Addendum Note (Signed)
Addended by: Raliegh Ip on: 01/01/2020 11:47 AM   Modules accepted: Orders

## 2020-01-10 ENCOUNTER — Ambulatory Visit (INDEPENDENT_AMBULATORY_CARE_PROVIDER_SITE_OTHER): Payer: BC Managed Care – PPO | Admitting: Orthopaedic Surgery

## 2020-01-10 ENCOUNTER — Encounter: Payer: Self-pay | Admitting: Orthopaedic Surgery

## 2020-01-10 VITALS — Ht 60.0 in | Wt 110.0 lb

## 2020-01-10 DIAGNOSIS — M545 Low back pain, unspecified: Secondary | ICD-10-CM

## 2020-01-10 DIAGNOSIS — G8929 Other chronic pain: Secondary | ICD-10-CM

## 2020-01-10 NOTE — Progress Notes (Signed)
Office Visit Note   Patient: Ruth Terry           Date of Birth: 01/20/1955           MRN: [id number] Visit Date: 01/10/2020              Requested by: Raliegh Ip, DO [address] [place],  [place] [address] PCP: Raliegh Ip, DO   Assessment & Plan: Visit Diagnoses:  1. Chronic bilateral low back pain, unspecified whether sciatica present     Plan: We will set patient up for some physical therapy for treatment of her back pain buttocks discomfort and strengthening as well as balance.  She can return in 6 weeks.  Follow-Up Instructions: Return in about 6 weeks (around 02/21/2020).   Orders:  Orders Placed This Encounter  Procedures  . Ambulatory referral to Physical Therapy   No orders of the defined types were placed in this encounter.     Procedures: No procedures performed   Clinical Data: No additional findings.   Subjective: Chief Complaint  Patient presents with  . Lower Back - Pain    HPI 65 year old female seen with low back pain radiating to both legs with aching some unsteadiness on her feet this been present for several weeks.  She is taking over-the-counter medication but does not seem that it may have helped and she states she also does not like to take medication.  Lumbar MRI showed normal levels from T12-L5 and minimal disc bulge at L5-S1 with posterior annular fissure without compression.  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  Bone density scan shows osteoporosis and she was just started on Boniva.  Review of Systems 's reflux no bowel bladder symptoms no fever or chills.  Objective: Vital Signs: Ht 5' (1.524 m)   Wt 110 lb (49.9 kg)   BMI 21.48 kg/m   Physical Exam Constitutional:      Appearance: She is well-developed.  HENT:     Head: Normocephalic.     Right Ear: External ear normal.     Left Ear: External ear normal.  Eyes:     Pupils: Pupils are equal, round, and reactive to light.  Neck:      Thyroid: No thyromegaly.     Trachea: No tracheal deviation.  Cardiovascular:     Rate and Rhythm: Normal rate.  Pulmonary:     Effort: Pulmonary effort is normal.  Abdominal:     Palpations: Abdomen is soft.  Skin:    General: Skin is warm and dry.  Neurological:     Mental Status: She is alert and oriented to person, place, and time.  Psychiatric:        Mood and Affect: Mood and affect normal.        Behavior: Behavior normal.     Ortho Exam bilateral femoral anteversion with intoeing gait symmetrical.  No hip flexion weakness knee and ankle jerk are intact.  Some tenderness to the lumbar sacral junction.  Mild static notch tenderness no trochanteric bursal tenderness negative logroll of the hips.  Knee and ankle jerks are intact.  Sensory testing lower extremities is normal.  She is able to heel and toe walk.  With changing positions turning twisting balance is rated at fair plus.  Specialty Comments:  No specialty comments available.  Imaging: No results found.   PMFS History: Patient Active Problem List   Diagnosis Date Noted  . History of shingles 04/24/2019  .  Chronic midline low back pain without sciatica 02/27/2019  . Chronic idiopathic constipation 04/26/2018  . Aortic atherosclerosis (HCC) 03/01/2017  . Rib fracture 12/07/2016  . Gas pain 03/09/2016  . Anal itching 03/09/2016  . Elevated LDL cholesterol level 10/24/2015  . Internal hemorrhoids 05/14/2014  . GERD (gastroesophageal reflux disease) 09/17/2011  . IBS (irritable bowel syndrome) 08/25/2010  . Gastroparesis 06/30/2010  . Non-ulcer dyspepsia 04/03/2010   Past Medical History:  Diagnosis Date  . Anal fissure   . Chronic constipation   . Diverticulosis 04-20-10   colonoscopy  . Gastroparesis   . GERD (gastroesophageal reflux disease)   . Hemorrhoids   . Hyperlipidemia   . IBS (irritable bowel syndrome)   . MVP (mitral valve prolapse)   . Polyp, stomach 04-20-10   egd  . Shingles   . UTI  (lower urinary tract infection)     Family History  Problem Relation Age of Onset  . Hypertension Mother   . Diabetes Father   . Heart attack Father        Age 45    Past Surgical History:  Procedure Laterality Date  . COLONOSCOPY    . ESOPHAGOGASTRODUODENOSCOPY     Social History   Occupational History  . Occupation: Interior and spatial designer    Comment: Dentist: [employer]  Tobacco Use  . Smoking status: Former Smoker    Packs/day: 0.50    Years: 6.00    Pack years: 3.00    Types: Cigarettes  . Smokeless tobacco: Never Used  . Tobacco comment: USE SMOKELESS CIGARETTES  Vaping Use  . Vaping Use: Never used  Substance and Sexual Activity  . Alcohol use: Yes    Alcohol/week: 0.0 standard drinks    Comment: 1 a week, wine  . Drug use: No  . Sexual activity: Not on file

## 2020-01-16 ENCOUNTER — Ambulatory Visit: Payer: BC Managed Care – PPO | Admitting: Orthopaedic Surgery

## 2020-01-22 ENCOUNTER — Ambulatory Visit (HOSPITAL_COMMUNITY): Payer: No Typology Code available for payment source | Admitting: Physical Therapy

## 2020-01-23 ENCOUNTER — Ambulatory Visit (INDEPENDENT_AMBULATORY_CARE_PROVIDER_SITE_OTHER): Payer: BC Managed Care – PPO | Admitting: Family Medicine

## 2020-01-23 DIAGNOSIS — M5126 Other intervertebral disc displacement, lumbar region: Secondary | ICD-10-CM

## 2020-01-23 DIAGNOSIS — M810 Age-related osteoporosis without current pathological fracture: Secondary | ICD-10-CM

## 2020-01-23 DIAGNOSIS — M5136 Other intervertebral disc degeneration, lumbar region: Secondary | ICD-10-CM

## 2020-01-23 DIAGNOSIS — K5904 Chronic idiopathic constipation: Secondary | ICD-10-CM

## 2020-01-23 DIAGNOSIS — F439 Reaction to severe stress, unspecified: Secondary | ICD-10-CM

## 2020-01-23 NOTE — Telephone Encounter (Signed)
Pt is doing well on Boniva, we will hold off on Prolia

## 2020-01-23 NOTE — Progress Notes (Signed)
Telephone visit  Subjective: CC: f/u GAD, constipation, osteoporosis PCP: [name], DO WGY:[id number] Ruth Terry is a 65 y.o. female calls for telephone consult today. Patient provides verbal consent for consult held via phone.  Due to COVID-19 pandemic this visit was conducted virtually. This visit type was conducted due to national recommendations for restrictions regarding the COVID-19 Pandemic (e.g. social distancing, sheltering in place) in an effort to limit this patient's exposure and mitigate transmission in our community. All issues noted in this document were discussed and addressed.  A physical exam was not performed with this format.   Location of patient: Home Location of provider: [facility] Others present for call: Spouse  1.  Anxiety disorder/stress at home Patient admits that she did not ever start the mirtazapine because she was afraid to be excessively sedated.  Her husband is under hospice care now and she did not want to be too groggy to help him.  She feels that things have leveled out though.  2. constipation Patient with ongoing chronic constipation.  She takes Linzess and this is more affordable now on her current insurance.  She admits that her lifestyle currently is probably promoting constipation as she has not been as physically active as her normal  3. back pain Patient has seen Dr. Lorin Mercy and had MRI done.  She had small bulging disc.  She has been reordered to do physical therapy for the next 6 weeks and then they will reconvene.  She is guarded about how effective the physical therapy will be since she has failed this in the past  4. osteoporosis Patient noted to have vitamin D deficiency back in November.  She completed her 8-week course of vitamin D replacement and is currently taking 1000 mg of vitamin D OTC daily.  She took her first dose of Boniva and did well with it.  No significant GI issues.    ROS: Per HPI  Allergies  Allergen Reactions  .  Chlordiazepoxide-Clidinium Itching  . Ciprofloxacin Itching    Irritates stomach  . Flagyl [Metronidazole]     Severe yeast infection  . Sulfa Drugs Cross Reactors Nausea Only  . Cefdinir Nausea And Vomiting  . Nitrofurantoin Monohyd Macro Rash   Past Medical History:  Diagnosis Date  . Anal fissure   . Chronic constipation   . Diverticulosis 04-20-10   colonoscopy  . Gastroparesis   . GERD (gastroesophageal reflux disease)   . Hemorrhoids   . Hyperlipidemia   . IBS (irritable bowel syndrome)   . MVP (mitral valve prolapse)   . Polyp, stomach 04-20-10   egd  . Shingles   . UTI (lower urinary tract infection)     Current Outpatient Medications:  .  AMBULATORY NON FORMULARY MEDICATION, Medication Name: Domperidone 10 mg Take 1 tablet at bedtime daily., Disp: 90 tablet, Rfl: 0 .  dexlansoprazole (DEXILANT) 60 MG capsule, Take 1 capsule by mouth once daily, Disp: 30 capsule, Rfl: 0 .  ibandronate (BONIVA) 150 MG tablet, Take 1 tablet (150 mg total) by mouth every 30 (thirty) days. Take in the morning with a full glass of water, on an empty stomach, and do not take anything else by mouth or lie down for the next 30 min., Disp: 3 tablet, Rfl: 0 .  linaclotide (LINZESS) 145 MCG CAPS capsule, Take 1 capsule (145 mcg total) by mouth daily before breakfast., Disp: 30 capsule, Rfl: 0 .  LORazepam (ATIVAN) 0.5 MG tablet, Take 1 tablet 1 hour before MRI. May repeat  1 time 30 minutes later if needed., Disp: 2 tablet, Rfl: 0 .  mirtazapine (REMERON) 7.5 MG tablet, Take 1 tablet (7.5 mg total) by mouth at bedtime., Disp: 30 tablet, Rfl: 0 .  pantoprazole (PROTONIX) 40 MG tablet, Take by mouth. , Disp: , Rfl:  .  Probiotic Product (ALIGN) 4 MG CAPS, Take 1 capsule by mouth daily., Disp: , Rfl:  .  psyllium (METAMUCIL) 58.6 % packet, Take 1 packet by mouth daily., Disp: , Rfl:  .  Simethicone (PHAZYME MAXIMUM STRENGTH) 250 MG CAPS, Take 250 mg by mouth 3 (three) times daily. (Patient taking  differently: Take 250 mg by mouth 3 (three) times daily as needed (gas relief).), Disp: 14 capsule, Rfl:  .  valACYclovir (VALTREX) 1000 MG tablet, Take 1 tablet (1,000 mg total) by mouth at bedtime., Disp: 90 tablet, Rfl: 4  Assessment/ Plan: 65 y.o. female   Bulging lumbar disc  Osteoporosis without current pathological fracture, unspecified osteoporosis type - Plan: VITAMIN D 25 Hydroxy (Vit-D Deficiency, Fractures), CMP14+EGFR  Stress at home  Chronic idiopathic constipation  6 weeks of physical therapy as planned.  She has not yet started this due to weather and need to reschedule appointment.  Stress is stable.  Has not started mirtazapine.  Osteoporosis treatment is well-tolerated at this point.  We discussed benefits of bisphosphonates in osteoporosis and that this is gold standard treatment.  Continue current regimen.  Plan for recheck of calcium and vitamin D level  Constipation is stable.  Continue Linzess.  Could consider increasing dose if has persistent issues  Start time: 12:33pm End time: 12:46pm  Total time spent on patient care (including telephone call/ virtual visit): 13 minutes  [place], [place] [phone]

## 2020-01-25 ENCOUNTER — Other Ambulatory Visit: Payer: Self-pay | Admitting: Family Medicine

## 2020-01-25 ENCOUNTER — Telehealth: Payer: Self-pay | Admitting: Family Medicine

## 2020-01-25 DIAGNOSIS — K219 Gastro-esophageal reflux disease without esophagitis: Secondary | ICD-10-CM

## 2020-01-25 NOTE — Telephone Encounter (Signed)
  Prescription Request  01/25/2020  What is the name of the medication or equipment? Dexilant   Have you contacted your pharmacy to request a refill? (if applicable) yes  Which pharmacy would you like this sent to? [facility] in [place]   *took last one today*   Patient notified that their request is being sent to the clinical staff for review and that they should receive a response within 2 business days.

## 2020-01-25 NOTE — Telephone Encounter (Signed)
Refilled in RF inbasket

## 2020-01-28 ENCOUNTER — Other Ambulatory Visit: Payer: Self-pay | Admitting: Family Medicine

## 2020-01-28 ENCOUNTER — Telehealth: Payer: Self-pay

## 2020-01-28 MED ORDER — LANSOPRAZOLE 30 MG PO CPDR: 30.0000 mg | DELAYED_RELEASE_CAPSULE | Freq: Every day | ORAL | 12 refills | Status: DC

## 2020-01-28 NOTE — Telephone Encounter (Signed)
Pt is checking on acid reflux pills. She is out of meds

## 2020-01-29 NOTE — Telephone Encounter (Signed)
Prevacid was sent yesterday.  Was it not received by pharmacy?  If not, please resend.

## 2020-01-29 NOTE — Telephone Encounter (Signed)
Dexilant is not on pts formulary  Key: [id number]  The pharmacy claim for Dexilant 60MG  dr capsules has been rejected by insurance. Non-preferred medications may have a higher patient co-pay than the health insurance plan's preferred medications. Using available formulary data, we believe that the following medications may be covered. Drug Name  PA Requirement Pantoprazole Sodium Tier 1 NOT Required*  Dexlansoprazole Tier 1 NOT Required*  Omeprazole Tier 1             NOT Required*  Famotidine Tier 1             NOT Required*  RABEprazole Sodium Tier 1, QL NOT Required*  NexIUM Tier 2, QL, ST Required*  NexIUM Tier 2, QL, ST Required*  NexIUM Tier 3                         NOT Required*  NexIUM Tier 3                         NOT Required*  NexIUM Tier 3                         NOT Required*  Zegerid Tier 3                         NOT Required*  Zegerid Tier 3                         NOT Required*  NexIUM 24HR Tier 3             NOT Required*  Prevacid SoluTab Tier 3 NOT Required*

## 2020-02-01 ENCOUNTER — Telehealth: Payer: Self-pay | Admitting: Family Medicine

## 2020-02-01 MED ORDER — OMEPRAZOLE 20 MG PO CPDR: 20.0000 mg | DELAYED_RELEASE_CAPSULE | Freq: Every day | ORAL | 3 refills | Status: DC

## 2020-02-01 NOTE — Telephone Encounter (Signed)
Spoke with patient.  Patient states that she has been on Effie for 10 years with added pepcid as needed.  Patient states that this worked very well for her.  Patient also states that Prevacid is making her extremely nauseous and was vomiting all night.  Patient would like to try something different OTC asap.  Insurance wants patient to try a cheaper medication before dexilant will be approved.

## 2020-02-01 NOTE — Telephone Encounter (Signed)
Omeprazole sent. If fails this one, recommend resubmitting Dexilant for PA

## 2020-02-01 NOTE — Telephone Encounter (Signed)
Pt stated that her new acid reflux medicine was not working - making her stomach cramp and feeling nauseas. Started taking the medicine Wednesday 1/26 and the cramps started that day but got worse each day.

## 2020-02-01 NOTE — Telephone Encounter (Signed)
Patient aware.

## 2020-02-01 NOTE — Telephone Encounter (Signed)
Please ask what she has taken in past and didn't tolerate so we can pick something for her.

## 2020-02-04 ENCOUNTER — Ambulatory Visit (HOSPITAL_COMMUNITY): Payer: 59 | Admitting: Physical Therapy

## 2020-02-05 ENCOUNTER — Other Ambulatory Visit: Payer: Self-pay

## 2020-02-05 NOTE — Telephone Encounter (Signed)
Omeprazole sent 02/01/20 by Dr. Lajuana Ripple.

## 2020-02-07 ENCOUNTER — Ambulatory Visit (HOSPITAL_COMMUNITY): Payer: No Typology Code available for payment source | Admitting: Physical Therapy

## 2020-02-11 ENCOUNTER — Encounter: Payer: Self-pay | Admitting: Family Medicine

## 2020-02-11 ENCOUNTER — Telehealth (INDEPENDENT_AMBULATORY_CARE_PROVIDER_SITE_OTHER): Payer: Self-pay | Admitting: Family Medicine

## 2020-02-11 ENCOUNTER — Telehealth: Payer: Self-pay

## 2020-02-11 DIAGNOSIS — M810 Age-related osteoporosis without current pathological fracture: Secondary | ICD-10-CM

## 2020-02-11 DIAGNOSIS — J329 Chronic sinusitis, unspecified: Secondary | ICD-10-CM

## 2020-02-11 DIAGNOSIS — K5904 Chronic idiopathic constipation: Secondary | ICD-10-CM

## 2020-02-11 DIAGNOSIS — K219 Gastro-esophageal reflux disease without esophagitis: Secondary | ICD-10-CM

## 2020-02-11 DIAGNOSIS — J310 Chronic rhinitis: Secondary | ICD-10-CM

## 2020-02-11 DIAGNOSIS — F439 Reaction to severe stress, unspecified: Secondary | ICD-10-CM

## 2020-02-11 DIAGNOSIS — M5136 Other intervertebral disc degeneration, lumbar region: Secondary | ICD-10-CM

## 2020-02-11 DIAGNOSIS — M5126 Other intervertebral disc displacement, lumbar region: Secondary | ICD-10-CM

## 2020-02-11 MED ORDER — LINACLOTIDE 145 MCG PO CAPS: 145.0000 ug | ORAL_CAPSULE | Freq: Every day | ORAL | 3 refills | Status: DC

## 2020-02-11 MED ORDER — DEXLANSOPRAZOLE 60 MG PO CPDR: 60.0000 mg | DELAYED_RELEASE_CAPSULE | Freq: Every day | ORAL | 3 refills | Status: DC

## 2020-02-11 NOTE — Patient Instructions (Signed)
Low-FODMAP Eating Plan  FODMAP stands for fermentable oligosaccharides, disaccharides, monosaccharides, and polyols. These are sugars that are hard for some people to digest. A low-FODMAP eating plan may help some people who have irritable bowel syndrome (IBS) and certain other bowel (intestinal) diseases to manage their symptoms. This meal plan can be complicated to follow. Work with a diet and nutrition specialist (dietitian) to make a low-FODMAP eating plan that is right for you. A dietitian can help make sure that you get enough nutrition from this diet. What are tips for following this plan? Reading food labels  Check labels for hidden FODMAPs such as: ? High-fructose syrup. ? Honey. ? Agave. ? Natural fruit flavors. ? Onion or garlic powder.  Choose low-FODMAP foods that contain 3-4 grams of fiber per serving.  Check food labels for serving sizes. Eat only one serving at a time to make sure FODMAP levels stay low. Shopping  Shop with a list of foods that are recommended on this diet and make a meal plan. Meal planning  Follow a low-FODMAP eating plan for up to 6 weeks, or as told by your health care provider or dietitian.  To follow the eating plan: 1. Eliminate high-FODMAP foods from your diet completely. Choose only low-FODMAP foods to eat. You will do this for 2-6 weeks. 2. Gradually reintroduce high-FODMAP foods into your diet one at a time. Most people should wait a few days before introducing the next new high-FODMAP food into their meal plan. Your dietitian can recommend how quickly you may reintroduce foods. 3. Keep a daily record of what and how much you eat and drink. Make note of any symptoms that you have after eating. 4. Review your daily record with a dietitian regularly to identify which foods you can eat and which foods you should avoid. General tips  Drink enough fluid each day to keep your urine pale yellow.  Avoid processed foods. These often have added sugar  and may be high in FODMAPs.  Avoid most dairy products, whole grains, and sweeteners.  Work with a dietitian to make sure you get enough fiber in your diet.  Avoid high FODMAP foods at meals to manage symptoms. Recommended foods Fruits Bananas, oranges, tangerines, lemons, limes, blueberries, raspberries, strawberries, grapes, cantaloupe, honeydew melon, kiwi, papaya, passion fruit, and pineapple. Limited amounts of dried cranberries, banana chips, and shredded coconut. Vegetables Eggplant, zucchini, cucumber, peppers, green beans, bean sprouts, lettuce, arugula, kale, Swiss chard, spinach, collard greens, bok choy, summer squash, potato, and tomato. Limited amounts of corn, carrot, and sweet potato. Green parts of scallions. Grains Gluten-free grains, such as rice, oats, buckwheat, quinoa, corn, polenta, and millet. Gluten-free pasta, bread, or cereal. Rice noodles. Corn tortillas. Meats and other proteins Unseasoned beef, pork, poultry, or fish. Eggs. Berniece Salines. Tofu (firm) and tempeh. Limited amounts of nuts and seeds, such as almonds, walnuts, [place] nuts, pecans, peanuts, nut butters, pumpkin seeds, chia seeds, and sunflower seeds. Dairy Lactose-free milk, yogurt, and kefir. Lactose-free cottage cheese and ice cream. Non-dairy milks, such as almond, coconut, hemp, and rice milk. Non-dairy yogurt. Limited amounts of goat cheese, brie, mozzarella, parmesan, swiss, and other hard cheeses. Fats and oils Butter-free spreads. Vegetable oils, such as olive, canola, and sunflower oil. Seasoning and other foods Artificial sweeteners with names that do not end in "ol," such as aspartame, saccharine, and stevia. Maple syrup, white table sugar, raw sugar, brown sugar, and molasses. Mayonnaise, soy sauce, and tamari. Fresh basil, coriander, parsley, rosemary, and thyme. Beverages Water and  mineral water. Sugar-sweetened soft drinks. Small amounts of orange juice or cranberry juice. Black and green tea.  Most dry wines. Coffee. The items listed above may not be a complete list of foods and beverages you can eat. Contact a dietitian for more information. Foods to avoid Fruits Fresh, dried, and juiced forms of apple, pear, watermelon, peach, plum, cherries, apricots, blackberries, boysenberries, figs, nectarines, and mango. Avocado. Vegetables Chicory root, artichoke, asparagus, cabbage, snow peas, Brussels sprouts, broccoli, sugar snap peas, mushrooms, celery, and cauliflower. Onions, garlic, leeks, and the white part of scallions. Grains Wheat, including kamut, durum, and semolina. Barley and bulgur. Couscous. Wheat-based cereals. Wheat noodles, bread, crackers, and pastries. Meats and other proteins Fried or fatty meat. Sausage. Cashews and pistachios. Soybeans, baked beans, black beans, chickpeas, kidney beans, fava beans, navy beans, lentils, black-eyed peas, and split peas. Dairy Milk, yogurt, ice cream, and soft cheese. Cream and sour cream. Milk-based sauces. Custard. Buttermilk. Soy milk. Seasoning and other foods Any sugar-free gum or candy. Foods that contain artificial sweeteners such as sorbitol, mannitol, isomalt, or xylitol. Foods that contain honey, high-fructose corn syrup, or agave. Bouillon, vegetable stock, beef stock, and chicken stock. Garlic and onion powder. Condiments made with onion, such as hummus, chutney, pickles, relish, salad dressing, and salsa. Tomato paste. Beverages Chicory-based drinks. Coffee substitutes. Chamomile tea. Fennel tea. Sweet or fortified wines such as port or sherry. Diet soft drinks made with isomalt, mannitol, maltitol, sorbitol, or xylitol. Apple, pear, and mango juice. Juices with high-fructose corn syrup. The items listed above may not be a complete list of foods and beverages you should avoid. Contact a dietitian for more information. Summary  FODMAP stands for fermentable oligosaccharides, disaccharides, monosaccharides, and polyols. These  are sugars that are hard for some people to digest.  A low-FODMAP eating plan is a short-term diet that helps to ease symptoms of certain bowel diseases.  The eating plan usually lasts up to 6 weeks. After that, high-FODMAP foods are reintroduced gradually and one at a time. This can help you find out which foods may be causing symptoms.  A low-FODMAP eating plan can be complicated. It is best to work with a dietitian who has experience with this type of plan. This information is not intended to replace advice given to you by your health care provider. Make sure you discuss any questions you have with your health care provider. Document Revised: 05/10/2019 Document Reviewed: 05/10/2019 Elsevier Patient Education  [place].

## 2020-02-11 NOTE — Progress Notes (Signed)
MyChart Video visit  Subjective: CC: f/u GERD, osteoporosis PCP: [name], DO JQZ:ESPQZRA Ruth Terry is a 65 y.o. female. Patient provides verbal consent for consult held via video.  Due to COVID-19 pandemic this visit was conducted virtually. This visit type was conducted due to national recommendations for restrictions regarding the COVID-19 Pandemic (e.g. social distancing, sheltering in place) in an effort to limit this patient's exposure and mitigate transmission in our community. All issues noted in this document were discussed and addressed.  A physical exam was not performed with this format.   Location of patient: work Location of provider: [facility] Others present for call: none  1. GERD Still having pretty bad acid reflux despite use of Omeprazole.  She had nausea from the Prevacid.  Her dexilant was superior to this.  She has been watching her diet but symptoms persist.  2. Osteoporosis She is taking Vit D and Calcium and Boniva.  She is tolerating the Boniva without difficulty so far.  3. Bulging lumbar disc She is a little behind on her PT due to family stressors.  She going to [facility] for therapy. She will be going on Wednesday.  Ongoing back pain and weakness.  4. Sinusitis/ otalgia She uses Flonase for this but wants to know if this is safe to use daily.  She reports sinus and facial pressure and a "fluid feeling" in her ears. NO fever, cough.  ROS: Per HPI  Allergies  Allergen Reactions  . Chlordiazepoxide-Clidinium Itching  . Ciprofloxacin Itching    Irritates stomach  . Flagyl [Metronidazole]     Severe yeast infection  . Sulfa Drugs Cross Reactors Nausea Only  . Cefdinir Nausea And Vomiting  . Nitrofurantoin Monohyd Macro Rash   Past Medical History:  Diagnosis Date  . Anal fissure   . Chronic constipation   . Diverticulosis 04-20-10   colonoscopy  . Gastroparesis   . GERD (gastroesophageal reflux disease)   . Hemorrhoids   . Hyperlipidemia   . IBS  (irritable bowel syndrome)   . MVP (mitral valve prolapse)   . Polyp, stomach 04-20-10   egd  . Shingles   . UTI (lower urinary tract infection)     Current Outpatient Medications:  .  AMBULATORY NON FORMULARY MEDICATION, Medication Name: Domperidone 10 mg Take 1 tablet at bedtime daily., Disp: 90 tablet, Rfl: 0 .  ibandronate (BONIVA) 150 MG tablet, Take 1 tablet (150 mg total) by mouth every 30 (thirty) days. Take in the morning with a full glass of water, on an empty stomach, and do not take anything else by mouth or lie down for the next 30 min., Disp: 3 tablet, Rfl: 0 .  linaclotide (LINZESS) 145 MCG CAPS capsule, Take 1 capsule (145 mcg total) by mouth daily before breakfast., Disp: 30 capsule, Rfl: 0 .  LORazepam (ATIVAN) 0.5 MG tablet, Take 1 tablet 1 hour before MRI. May repeat 1 time 30 minutes later if needed., Disp: 2 tablet, Rfl: 0 .  mirtazapine (REMERON) 7.5 MG tablet, Take 1 tablet (7.5 mg total) by mouth at bedtime., Disp: 30 tablet, Rfl: 0 .  omeprazole (PRILOSEC) 20 MG capsule, Take 1 capsule (20 mg total) by mouth daily., Disp: 90 capsule, Rfl: 3 .  Probiotic Product (ALIGN) 4 MG CAPS, Take 1 capsule by mouth daily., Disp: , Rfl:  .  psyllium (METAMUCIL) 58.6 % packet, Take 1 packet by mouth daily., Disp: , Rfl:  .  Simethicone (PHAZYME MAXIMUM STRENGTH) 250 MG CAPS, Take 250 mg  by mouth 3 (three) times daily. (Patient taking differently: Take 250 mg by mouth 3 (three) times daily as needed (gas relief).), Disp: 14 capsule, Rfl:  .  valACYclovir (VALTREX) 1000 MG tablet, Take 1 tablet (1,000 mg total) by mouth at bedtime., Disp: 90 tablet, Rfl: 4  Gen: well appearing female Psych: mood stable  Assessment/ Plan: 65 y.o. female   Age-related osteoporosis without current pathological fracture  Stress at home  Chronic idiopathic constipation  Bulging lumbar disc  Rhinosinusitis  Gastroesophageal reflux disease without esophagitis - Plan: dexlansoprazole (DEXILANT)  60 MG capsule  Stable from an osteoporosis standpoint.  Continue Boniva once monthly, vitamin D and calcium.  Plan for repeat DEXA scan in 2 years  Continues to have quite a bit of stress at home.  May be impacting IBS.  Continue Linzess.  This has been renewed for a year.  Higher doses caused diarrhea  With ongoing abdominal cramping and bloating I have advised her to start mesenteric release.  I have advised her to look up this video on YouTube on how to properly perform.  Hopefully this will relieve some of the slowing of her gastric motility.  She is pretty much maxed out on pharmacologic interventions  She is in physical therapy for lumbar disc.  She will continue to follow-up with Dr. Rexene Agent to use Nasonex instead of Flonase if she finds to be more helpful.  Okay to use nasal saline.  No evidence of infection at this time  GERD is refractory to now Prevacid and omeprazole.  Recommending repeat prior authorization for Dexilant.  May need to submit as a brand if this is preferred by her insurance   Start time: 12:50pm End time: 1:12pm  Total time spent on patient care (including video visit/ documentation): 22 minutes  [place], [place] [phone]

## 2020-02-11 NOTE — Telephone Encounter (Signed)
appt made, pt aware

## 2020-02-13 ENCOUNTER — Encounter (HOSPITAL_COMMUNITY): Payer: Self-pay

## 2020-02-13 ENCOUNTER — Other Ambulatory Visit: Payer: Self-pay

## 2020-02-13 ENCOUNTER — Ambulatory Visit (HOSPITAL_COMMUNITY): Payer: 59 | Attending: Orthopaedic Surgery

## 2020-02-13 DIAGNOSIS — M6281 Muscle weakness (generalized): Secondary | ICD-10-CM | POA: Insufficient documentation

## 2020-02-13 DIAGNOSIS — R293 Abnormal posture: Secondary | ICD-10-CM | POA: Diagnosis not present

## 2020-02-13 DIAGNOSIS — M545 Low back pain, unspecified: Secondary | ICD-10-CM | POA: Insufficient documentation

## 2020-02-13 DIAGNOSIS — G8929 Other chronic pain: Secondary | ICD-10-CM | POA: Insufficient documentation

## 2020-02-13 NOTE — Therapy (Signed)
[place], [place], [address] Phone: [phone]   Fax:  [phone]  Physical Therapy Evaluation  Patient Details  Name: Ruth Terry MRN: [id number] Date of Birth: [birth year] Referring Provider (PT): Rodell Perna, MD   Encounter Date: 02/13/2020   PT End of Session - 02/13/20 1345    Visit Number 1    Number of Visits 12    Date for PT Re-Evaluation 03/26/20    Authorization Type [employer], 30 combined PT/OT, 0 used    Authorization - Visit Number 1    Authorization - Number of Visits 30    Progress Note Due on Visit 10    PT Start Time 6387    PT Stop Time 1440    PT Time Calculation (min) 53 min           Past Medical History:  Diagnosis Date  . Anal fissure   . Chronic constipation   . Diverticulosis 04-20-10   colonoscopy  . Gastroparesis   . GERD (gastroesophageal reflux disease)   . Hemorrhoids   . Hyperlipidemia   . IBS (irritable bowel syndrome)   . MVP (mitral valve prolapse)   . Polyp, stomach 04-20-10   egd  . Shingles   . UTI (lower urinary tract infection)     Past Surgical History:  Procedure Laterality Date  . COLONOSCOPY    . ESOPHAGOGASTRODUODENOSCOPY      There were no vitals filed for this visit.    Subjective Assessment - 02/13/20 1351    Subjective Patient reports back pain and leg pain and reports majority of pain when standing in place and has been ongoing issue since 10/2019. 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    How long can you sit comfortably? no issues    How long can you stand comfortably? 5-10 minutes    Diagnostic tests MRI reveals bulging L5-S1    Patient Stated Goals decrease pain to return to doing caregiving activities    Currently in Pain? Yes    Pain Score 3     Pain Location Back    Pain Orientation Lower;Mid    Pain Descriptors / Indicators Aching    Pain Type  Chronic pain    Pain Radiating Towards bilateral LE, no numbess/tingling    Aggravating Factors  standing in place              [facility] PT Assessment - 02/13/20 0001      Assessment   Medical Diagnosis Lumbar radiculopathy    Referring Provider (PT) Rodell Perna, MD    Prior Therapy yes      Balance Screen   Has the patient fallen in the past 6 months No    Has the patient had a decrease in activity level because of a fear of falling?  Yes    Is the patient reluctant to leave their home because of a fear of falling?  No      Home Social worker Private residence    Living Arrangements Spouse/significant other      Prior Function   Level of Independence Independent    Vocation Full time employment      Observation/Other Assessments   Observations thoracic kyphosis/Dowager's hump    Focus on Therapeutic Outcomes (FOTO)  52% function      ROM / Strength   AROM / PROM / Strength  [place], [place], [address] Phone: [phone]   Fax:  [phone]  Physical Therapy Evaluation  Patient Details  Name: Ruth Terry MRN: [id number] Date of Birth: [birth year] Referring Provider (PT): Rodell Perna, MD   Encounter Date: 02/13/2020   PT End of Session - 02/13/20 1345    Visit Number 1    Number of Visits 12    Date for PT Re-Evaluation 03/26/20    Authorization Type [employer], 30 combined PT/OT, 0 used    Authorization - Visit Number 1    Authorization - Number of Visits 30    Progress Note Due on Visit 10    PT Start Time 6387    PT Stop Time 1440    PT Time Calculation (min) 53 min           Past Medical History:  Diagnosis Date  . Anal fissure   . Chronic constipation   . Diverticulosis 04-20-10   colonoscopy  . Gastroparesis   . GERD (gastroesophageal reflux disease)   . Hemorrhoids   . Hyperlipidemia   . IBS (irritable bowel syndrome)   . MVP (mitral valve prolapse)   . Polyp, stomach 04-20-10   egd  . Shingles   . UTI (lower urinary tract infection)     Past Surgical History:  Procedure Laterality Date  . COLONOSCOPY    . ESOPHAGOGASTRODUODENOSCOPY      There were no vitals filed for this visit.    Subjective Assessment - 02/13/20 1351    Subjective Patient reports back pain and leg pain and reports majority of pain when standing in place and has been ongoing issue since 10/2019. 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    How long can you sit comfortably? no issues    How long can you stand comfortably? 5-10 minutes    Diagnostic tests MRI reveals bulging L5-S1    Patient Stated Goals decrease pain to return to doing caregiving activities    Currently in Pain? Yes    Pain Score 3     Pain Location Back    Pain Orientation Lower;Mid    Pain Descriptors / Indicators Aching    Pain Type  Chronic pain    Pain Radiating Towards bilateral LE, no numbess/tingling    Aggravating Factors  standing in place              [facility] PT Assessment - 02/13/20 0001      Assessment   Medical Diagnosis Lumbar radiculopathy    Referring Provider (PT) Rodell Perna, MD    Prior Therapy yes      Balance Screen   Has the patient fallen in the past 6 months No    Has the patient had a decrease in activity level because of a fear of falling?  Yes    Is the patient reluctant to leave their home because of a fear of falling?  No      Home Social worker Private residence    Living Arrangements Spouse/significant other      Prior Function   Level of Independence Independent    Vocation Full time employment      Observation/Other Assessments   Observations thoracic kyphosis/Dowager's hump    Focus on Therapeutic Outcomes (FOTO)  52% function      ROM / Strength   AROM / PROM / Strength  Impression Statement Patient is a  65 yo lady presenting to physical therapy with c/o LBP and referred pain in BLE. She presents with pain limited deficits in strength, ROM/flexibility, endurance, postural impairments, spinal mobility and functional mobility with ADL. She is having to modify and restrict ADL as indicated by FOTO score as well as subjective information and objective measures which is affecting overall participation. Patient will benefit from skilled  physical therapy in order to improve function and reduce impairment.    Personal Factors and Comorbidities Comorbidity 1;Time since onset of injury/illness/exacerbation    Examination-Activity Limitations Locomotion Level;Stand;Caring for Others    Examination-Participation Restrictions Occupation;Cleaning;Meal Prep;Laundry    Stability/Clinical Decision Making Stable/Uncomplicated    Clinical Decision Making Low    Rehab Potential Good    PT Frequency 2x / week    PT Duration 6 weeks    PT Treatment/Interventions ADLs/Self Care Home Management;Cryotherapy;Electrical Stimulation;Moist Heat;Ultrasound;Gait training;Stair training;Functional mobility training;Therapeutic activities;Therapeutic exercise;Balance training;Neuromuscular re-education;Manual techniques;Passive range of motion;Patient/family education;Aquatic Therapy;Taping;Spinal Manipulations;Joint Manipulations    PT Next Visit Plan Continue with lumbar extension stretching, hamstring stretching, thoracic extension mobilization    PT Home Exercise Plan prone, prone elbows, hamstring stretching with strap and self stretch    Consulted and Agree with Plan of Care Patient           Patient will benefit from skilled therapeutic intervention in order to improve the following deficits and impairments:  Abnormal gait,Decreased range of motion,Decreased activity tolerance,Decreased strength,Pain,Postural dysfunction,Improper body mechanics,Hypomobility,Impaired flexibility  Visit Diagnosis: Abnormal posture  Muscle weakness (generalized)  Chronic bilateral low back pain, unspecified whether sciatica present     Problem List Patient Active Problem List   Diagnosis Date Noted  . History of shingles 04/24/2019  . Chronic midline low back pain without sciatica 02/27/2019  . Chronic idiopathic constipation 04/26/2018  . Aortic atherosclerosis ([place]) 03/01/2017  . Rib fracture 12/07/2016  . Gas pain 03/09/2016  . Anal itching  03/09/2016  . Elevated LDL cholesterol level 10/24/2015  . Internal hemorrhoids 05/14/2014  . GERD (gastroesophageal reflux disease) 09/17/2011  . IBS (irritable bowel syndrome) 08/25/2010  . Gastroparesis 06/30/2010  . Non-ulcer dyspepsia 04/03/2010   3:50 PM, 02/13/20 M. Sherlyn Lees, PT, DPT Physical Therapist- [place] Office Number: [phone]  [facility] [address] [place], [place], [address] Phone: [phone]   Fax:  [phone]  Name: Ruth Terry MRN: [id number] Date of Birth: [birth year]

## 2020-02-13 NOTE — Patient Instructions (Signed)
Access Code: [email or web address] URL: [email or web address] Date: 02/13/2020 Prepared by: Sherlyn Lees  Exercises Prone Shoulder Extension - 7 x weekly Prone on Elbows Stretch - 2 x daily - 7 x weekly - 3 sets - 5 reps - 5 sec hold Supine Hamstring Stretch with Strap - 2 x daily - 7 x weekly - 3 sets - 5 reps - 30-60 sec hold Supine Hamstring Stretch - 2 x daily - 7 x weekly - 3 sets - 5 reps - 5 sec hold

## 2020-02-18 ENCOUNTER — Ambulatory Visit: Payer: Self-pay | Admitting: Family

## 2020-02-18 ENCOUNTER — Other Ambulatory Visit: Payer: Self-pay

## 2020-02-18 ENCOUNTER — Ambulatory Visit (INDEPENDENT_AMBULATORY_CARE_PROVIDER_SITE_OTHER): Payer: Self-pay | Admitting: Family

## 2020-02-18 ENCOUNTER — Telehealth (HOSPITAL_COMMUNITY): Payer: Self-pay

## 2020-02-18 ENCOUNTER — Encounter: Payer: Self-pay | Admitting: Family

## 2020-02-18 ENCOUNTER — Ambulatory Visit (HOSPITAL_COMMUNITY): Payer: 59

## 2020-02-18 DIAGNOSIS — Z20822 Contact with and (suspected) exposure to covid-19: Secondary | ICD-10-CM

## 2020-02-18 DIAGNOSIS — R059 Cough, unspecified: Secondary | ICD-10-CM

## 2020-02-18 LAB — VERITOR FLU A/B WAIVED
Influenza A: NEGATIVE
Influenza B: NEGATIVE

## 2020-02-18 MED ORDER — PREDNISONE 10 MG (21) PO TBPK: ORAL_TABLET | ORAL | 0 refills | Status: DC

## 2020-02-18 NOTE — Progress Notes (Signed)
Virtual Visit via telephone Note Due to COVID-19 pandemic this visit was conducted virtually. This visit type was conducted due to national recommendations for restrictions regarding the COVID-19 Pandemic (e.g. social distancing, sheltering in place) in an effort to limit this patient's exposure and mitigate transmission in our community. All issues noted in this document were discussed and addressed.  A physical exam was not performed with this format.  I connected with Ruth Terry on 02/18/20 at 12:10 pm  by telephone and verified that I am speaking with the correct person using two identifiers. Ruth Terry is currently located at home and no one  is currently with her during visit. The provider, Evelina Dun, FNP is located in their office at time of visit.  I discussed the limitations, risks, security and privacy concerns of performing an evaluation and management service by telephone and the availability of in person appointments. I also discussed with the patient that there may be a patient responsible charge related to this service. The patient expressed understanding and agreed to proceed.   History and Present Illness:  Cough This is a new problem. The current episode started yesterday. The problem has been gradually worsening. The cough is productive of sputum. Associated symptoms include chills, ear congestion, ear pain, headaches, myalgias, nasal congestion and a sore throat. Pertinent negatives include no fever, postnasal drip, shortness of breath or wheezing. Associated symptoms comments: Sinus pressure .      Review of Systems  Constitutional: Positive for chills. Negative for fever.  HENT: Positive for ear pain and sore throat. Negative for postnasal drip.   Respiratory: Positive for cough. Negative for shortness of breath and wheezing.   Musculoskeletal: Positive for myalgias.  Neurological: Positive for headaches.     Observations/Objective: No SOB or  distress noted, hoarse voice.   Assessment and Plan: 1. Cough - Veritor Flu A/B Waived - Novel Coronavirus, NAA ([facility]) - predniSONE (STERAPRED UNI-PAK 21 TAB) 10 MG (21) TBPK tablet; Use as directed  Dispense: 21 tablet; Refill: 0  2. Encounter by telehealth for suspected COVID-19 - Take meds as prescribed - Use a cool mist humidifier  -Use saline nose sprays frequently -Force fluids -For any cough or congestion  Use plain Mucinex- regular strength or max strength is fine -For fever or aces or pains- take tylenol or ibuprofen. -Throat lozenges if help -RTO if symptoms worsen or do not improve  - Veritor Flu A/B Waived - Novel Coronavirus, NAA ([facility]) - predniSONE (STERAPRED UNI-PAK 21 TAB) 10 MG (21) TBPK tablet; Use as directed  Dispense: 21 tablet; Refill: 0      I discussed the assessment and treatment plan with the patient. The patient was provided an opportunity to ask questions and all were answered. The patient agreed with the plan and demonstrated an understanding of the instructions.   The patient was advised to call back or seek an in-person evaluation if the symptoms worsen or if the condition fails to improve as anticipated.  The above assessment and management plan was discussed with the patient. The patient verbalized understanding of and has agreed to the management plan. Patient is aware to call the clinic if symptoms persist or worsen. Patient is aware when to return to the clinic for a follow-up visit. Patient educated on when it is appropriate to go to the emergency department.   Time call ended:  12:21 pm   I provided 11 minutes of non-face-to-face time during this encounter.    Evelina Dun,  FNP

## 2020-02-18 NOTE — Telephone Encounter (Signed)
pt called to cx both appt sdue to she is not feeling well

## 2020-02-19 ENCOUNTER — Telehealth: Payer: Self-pay

## 2020-02-19 LAB — NOVEL CORONAVIRUS, NAA: SARS-CoV-2, NAA: DETECTED — AB

## 2020-02-19 LAB — SARS-COV-2, NAA 2 DAY TAT

## 2020-02-19 MED ORDER — MOLNUPIRAVIR EUA 200MG CAPSULE: 4.0000 | ORAL_CAPSULE | Freq: Two times a day (BID) | ORAL | 0 refills | Status: AC

## 2020-02-19 NOTE — Telephone Encounter (Signed)
Molnupiravir Prescription sent to pharmacy. Patient aware

## 2020-02-19 NOTE — Telephone Encounter (Signed)
Patient would like to know when she can retest for COVID. She states she will need a negative result before returning to work. Patient also wants to know if she qualifies for the infusion or oral medication to help with COVID.

## 2020-02-20 ENCOUNTER — Telehealth: Payer: Self-pay

## 2020-02-21 ENCOUNTER — Telehealth: Payer: Self-pay

## 2020-02-21 NOTE — Telephone Encounter (Signed)
Patient on antiviral meds for COVID just want to make sure she can take her valtrex and linzezz pepcid and all er normal meds. Please advise Covering pcp

## 2020-02-21 NOTE — Telephone Encounter (Signed)
Pt has questions about taking her medications while having covid. Please call back

## 2020-02-22 ENCOUNTER — Ambulatory Visit (HOSPITAL_COMMUNITY): Payer: 59

## 2020-02-22 NOTE — Telephone Encounter (Signed)
Yes, I do not see any apparent contraindications

## 2020-02-22 NOTE — Telephone Encounter (Signed)
Patient notified and verbalized understanding. Discussed Covid and quarantine at length and answered all of patients questions and addressed concerns.

## 2020-02-22 NOTE — Telephone Encounter (Signed)
Pt already has the med and is due to stop it on 02/24/20. Today is the 18th.   PA not to be done at this time

## 2020-02-22 NOTE — Telephone Encounter (Signed)
Pt is calling back to check to see what Doctor said about taking her regular meds with the covid medication

## 2020-02-26 ENCOUNTER — Ambulatory Visit (HOSPITAL_COMMUNITY): Payer: 59

## 2020-02-29 ENCOUNTER — Telehealth: Payer: Self-pay

## 2020-02-29 ENCOUNTER — Ambulatory Visit (HOSPITAL_COMMUNITY): Payer: 59

## 2020-02-29 NOTE — Telephone Encounter (Signed)
Pt wants to know that she can take OTC for covid sxs. She is having allergies like symptoms. Stuffy nose , drainage and sneezing. Please call back

## 2020-02-29 NOTE — Telephone Encounter (Signed)

## 2020-02-29 NOTE — Telephone Encounter (Signed)
Pt aware.

## 2020-03-10 ENCOUNTER — Other Ambulatory Visit: Payer: Self-pay | Admitting: Family Medicine

## 2020-03-10 ENCOUNTER — Encounter: Payer: Self-pay | Admitting: Family Medicine

## 2020-03-10 ENCOUNTER — Other Ambulatory Visit: Payer: Self-pay

## 2020-03-10 ENCOUNTER — Ambulatory Visit (INDEPENDENT_AMBULATORY_CARE_PROVIDER_SITE_OTHER): Payer: 59 | Admitting: Family Medicine

## 2020-03-10 VITALS — BP 129/78 | HR 108 | Temp 97.5°F | Ht 60.0 in | Wt 105.0 lb

## 2020-03-10 DIAGNOSIS — K580 Irritable bowel syndrome with diarrhea: Secondary | ICD-10-CM

## 2020-03-10 DIAGNOSIS — M810 Age-related osteoporosis without current pathological fracture: Secondary | ICD-10-CM | POA: Diagnosis not present

## 2020-03-10 DIAGNOSIS — K219 Gastro-esophageal reflux disease without esophagitis: Secondary | ICD-10-CM

## 2020-03-10 DIAGNOSIS — E559 Vitamin D deficiency, unspecified: Secondary | ICD-10-CM

## 2020-03-10 DIAGNOSIS — R4189 Other symptoms and signs involving cognitive functions and awareness: Secondary | ICD-10-CM | POA: Diagnosis not present

## 2020-03-10 NOTE — Patient Instructions (Signed)
Return stool studies as soon as possible  Hydrate with electrolytes in addition to your water.  Take Claritin or Xyzal for the sinus/ear fullness.  There was a visible effusion in the left ear.  For your bottom, use a protective cream.  There was visible chapping but no active hemorrhoid or tear.  You had labs performed today.  You will be contacted with the results of the labs once they are available, usually in the next 3 business days for routine lab work.  If you have an active my chart account, they will be released to your MyChart.  If you prefer to have these labs released to you via telephone, please let us know.  If you had a pap smear or biopsy performed, expect to be contacted in about 7-10 days.

## 2020-03-10 NOTE — Progress Notes (Signed)
Subjective: CC: Abdominal pain, diarrhea PCP: [name], DO BEM:[id number] Ruth Terry is a 65 y.o. female presenting to clinic today for:  1. Abdominal pain/ diarrhea Patient with ongoing abdominal pain and diarrhea.  She reports quite a bit of bloating and cramping of the abdomen extending up into the rib area.  She was recently diagnosed and treated for COVID-19 about 3 weeks ago.  She actually felt pretty okay during Covid infection but following the first week of diagnosis, her health seem to deteriorate.  She is having some intermittent dizziness and sinus fullness.  She has been using the Flonase and Nettie pot but symptoms do not seem to be improving much.  No reports of fever.  Her diarrhea is watery and brown.  No hematochezia or melena but she is worried about potential hemorrhoid flare as she is been having some rectal discomfort.  Additionally, she did not hear back about the Dexilant, which we had ordered during her last telephone visit.  She is currently taking omeprazole.  However, the omeprazole really does not work nearly as well as the [place] did.  She is hydrating without difficulty but is not using anything with electrolytes currently.  She has not taken anything like Imodium because she is on a promotility drug due to gastroparesis.  2.  Vitamin D deficiency/osteoporosis Patient is compliant with her Boniva.  She is on vitamin D and calcium orally.  ROS: Per HPI  Allergies  Allergen Reactions   Chlordiazepoxide-Clidinium Itching   Ciprofloxacin Itching    Irritates stomach   Flagyl [Metronidazole]     Severe yeast infection   Sulfa Drugs Cross Reactors Nausea Only   Cefdinir Nausea And Vomiting   Nitrofurantoin Monohyd Macro Rash   Past Medical History:  Diagnosis Date   Anal fissure    Chronic constipation    Diverticulosis 04-20-10   colonoscopy   Gastroparesis    GERD (gastroesophageal reflux disease)    Hemorrhoids    Hyperlipidemia     IBS (irritable bowel syndrome)    MVP (mitral valve prolapse)    Polyp, stomach 04-20-10   egd   Shingles    UTI (lower urinary tract infection)     Current Outpatient Medications:    AMBULATORY NON FORMULARY MEDICATION, Medication Name: Domperidone 10 mg Take 1 tablet at bedtime daily., Disp: 90 tablet, Rfl: 0   dexlansoprazole (DEXILANT) 60 MG capsule, Take 1 capsule (60 mg total) by mouth daily. Symptoms refractory to Prevacid and Omeprazole.  New PA needed for dexilant. May need to submit as brand, Disp: 90 capsule, Rfl: 3   ibandronate (BONIVA) 150 MG tablet, Take 1 tablet (150 mg total) by mouth every 30 (thirty) days. Take in the morning with a full glass of water, on an empty stomach, and do not take anything else by mouth or lie down for the next 30 min., Disp: 3 tablet, Rfl: 0   linaclotide (LINZESS) 145 MCG CAPS capsule, Take 1 capsule (145 mcg total) by mouth daily before breakfast., Disp: 90 capsule, Rfl: 3   LORazepam (ATIVAN) 0.5 MG tablet, Take 1 tablet 1 hour before MRI. May repeat 1 time 30 minutes later if needed., Disp: 2 tablet, Rfl: 0   mirtazapine (REMERON) 7.5 MG tablet, Take 1 tablet (7.5 mg total) by mouth at bedtime., Disp: 30 tablet, Rfl: 0   predniSONE (STERAPRED UNI-PAK 21 TAB) 10 MG (21) TBPK tablet, Use as directed, Disp: 21 tablet, Rfl: 0   Probiotic Product (ALIGN) 4 MG CAPS,  Take 1 capsule by mouth daily., Disp: , Rfl:    psyllium (METAMUCIL) 58.6 % packet, Take 1 packet by mouth daily., Disp: , Rfl:    Simethicone (PHAZYME MAXIMUM STRENGTH) 250 MG CAPS, Take 250 mg by mouth 3 (three) times daily. (Patient taking differently: Take 250 mg by mouth 3 (three) times daily as needed (gas relief).), Disp: 14 capsule, Rfl:    valACYclovir (VALTREX) 1000 MG tablet, Take 1 tablet (1,000 mg total) by mouth at bedtime., Disp: 90 tablet, Rfl: 4 Social History   Socioeconomic History   Marital status: Married    Spouse name: Not on file   Number of  children: 0   Years of education: Not on file   Highest education level: Not on file  Occupational History   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    Employer: [employer]  Tobacco Use   Smoking status: Former Smoker    Packs/day: 0.50    Years: 6.00    Pack years: 3.00    Types: Cigarettes   Smokeless tobacco: Never Used   Tobacco comment: USE SMOKELESS CIGARETTES  Vaping Use   Vaping Use: Never used  Substance and Sexual Activity   Alcohol use: Yes    Alcohol/week: 0.0 standard drinks    Comment: 1 a week, wine   Drug use: No   Sexual activity: Not on file  Other Topics Concern   Not on file  Social History Narrative   Not on file   Social Determinants of Health   Financial Resource Strain: Not on file  Food Insecurity: Not on file  Transportation Needs: Not on file  Physical Activity: Not on file  Stress: Not on file  Social Connections: Not on file  Intimate Partner Violence: Not on file   Family History  Problem Relation Age of Onset   Hypertension Mother    Diabetes Father    Heart attack Father        Age 71    Objective: Office vital signs reviewed. BP 129/78    Pulse (!) 108    Temp (!) 97.5 F (36.4 C) (Temporal)    Ht 5' (1.524 m)    Wt 105 lb (47.6 kg)    SpO2 96%    BMI 20.51 kg/m   Physical Examination:  General: Awake, alert, nontoxic, No acute distress HEENT: Normal; sclera white.  She has a clear effusion noted behind the left TM.  No bulging, erythema or purulence noted. Cardio: regular rate and rhythm, S1S2 heard, no murmurs appreciated Pulm: clear to auscultation bilaterally, no wheezes, rhonchi or rales; normal work of breathing on room air GI: Rectal exam without any evidence of active hemorrhoids, fissures or bleeding.  She does have chapped appearance to the rectum. Extremities: warm, well perfused, No edema, cyanosis or clubbing; +2 pulses bilaterally MSK: Ambulating  independently   Negative orthostatics  Assessment/ Plan: 65 y.o. female   Irritable bowel syndrome with diarrhea - Plan: Cdiff NAA+O+P+Stool Culture, Pancreatic Elastase, Fecal, Fecal fat, qualitative, Magnesium  Osteoporosis without current pathological fracture, unspecified osteoporosis type - Plan: CMP14+EGFR, VITAMIN D 25 Hydroxy (Vit-D Deficiency, Fractures)  Vitamin D deficiency  Brain fog  Check stool studies.  Home care instructions reviewed and handout provided.  Hydrate with electrolytes.  Orthostatics were negative.  Blood pressure was within normal range.  Mild tachycardia noted however.  Check vitamin D level.  Continue Boniva  Brain fog and dizziness I suspect are related to recent  infection and left ear effusion.  Advised to use Claritin or Xyzal in addition to her Flonase nasal spray.  [facility] was contacted and her Dexilant was indeed covered but the cost was well over $200 which was why it was not filled.  Patient will decide whether or not to proceed with this medication   No orders of the defined types were placed in this encounter.  No orders of the defined types were placed in this encounter.    [name], DO [place] [phone]

## 2020-03-11 ENCOUNTER — Other Ambulatory Visit: Payer: 59

## 2020-03-11 ENCOUNTER — Telehealth: Payer: Self-pay

## 2020-03-11 DIAGNOSIS — K219 Gastro-esophageal reflux disease without esophagitis: Secondary | ICD-10-CM

## 2020-03-11 LAB — CMP14+EGFR
ALT: 10 IU/L (ref 0–32)
AST: 14 IU/L (ref 0–40)
Albumin/Globulin Ratio: 2 (ref 1.2–2.2)
Albumin: 4.3 g/dL (ref 3.8–4.8)
Alkaline Phosphatase: 80 IU/L (ref 44–121)
BUN/Creatinine Ratio: 8 — ABNORMAL LOW (ref 12–28)
BUN: 6 mg/dL — ABNORMAL LOW (ref 8–27)
Bilirubin Total: <0.2 mg/dL (ref 0.0–1.2)
CO2: 22 mmol/L (ref 20–29)
Calcium: 9.3 mg/dL (ref 8.7–10.3)
Chloride: 103 mmol/L (ref 96–106)
Creatinine, Ser: 0.73 mg/dL (ref 0.57–1.00)
Globulin, Total: 2.1 g/dL (ref 1.5–4.5)
Glucose: 90 mg/dL (ref 65–99)
Potassium: 3.7 mmol/L (ref 3.5–5.2)
Sodium: 140 mmol/L (ref 134–144)
Total Protein: 6.4 g/dL (ref 6.0–8.5)
eGFR: 93 mL/min/{1.73_m2} (ref >59)

## 2020-03-11 LAB — VITAMIN D 25 HYDROXY (VIT D DEFICIENCY, FRACTURES): Vit D, 25-Hydroxy: 29.4 ng/mL — ABNORMAL LOW (ref 30.0–100.0)

## 2020-03-11 LAB — MAGNESIUM: Magnesium: 2.1 mg/dL (ref 1.6–2.3)

## 2020-03-11 MED ORDER — DEXILANT 60 MG PO CPDR: 60.0000 mg | DELAYED_RELEASE_CAPSULE | Freq: Every day | ORAL | 3 refills | Status: DC

## 2020-03-11 NOTE — Telephone Encounter (Signed)
Pt called 2x this morning for dexlansoprazole (DEXILANT) 60 MG capsule rx refill. I explained to her that it was called in 02/11/2020 with refills to [facility]. She is saying that it is not there. She needs a PA for it. She is saying that something else was called in and she does not want to take that. She then asked for a nurse to call back when done.

## 2020-03-11 NOTE — Telephone Encounter (Signed)
WM had requested name brand be sent in for [place] - so that is what I sent today 03/11/20 to [facility].  I attempted to call pt - no VM and no  [facility]  - med does not need PA  Her cost for this med is expensive.

## 2020-03-13 NOTE — Telephone Encounter (Signed)
Pt came by- was made aware

## 2020-03-14 ENCOUNTER — Other Ambulatory Visit: Payer: Self-pay

## 2020-03-14 LAB — PANCREATIC ELASTASE, FECAL: Pancreatic Elastase, Fecal: 95 ug Elast./g — ABNORMAL LOW

## 2020-03-14 LAB — FECAL FAT, QUALITATIVE
Fat Qual Neutral, Stl: NORMAL
Fat Qual Total, Stl: NORMAL

## 2020-03-14 MED ORDER — ZENPEP 3000-10000 UNITS PO CPEP: 1.0000 | ORAL_CAPSULE | Freq: Three times a day (TID) | ORAL | 6 refills | Status: DC

## 2020-03-15 LAB — CDIFF NAA+O+P+STOOL CULTURE
E coli, Shiga toxin Assay: NEGATIVE
Toxigenic C. Difficile by PCR: NEGATIVE

## 2020-03-17 ENCOUNTER — Telehealth: Payer: Self-pay | Admitting: Physician Assistant

## 2020-03-17 NOTE — Telephone Encounter (Signed)
Pt is requesting a call back from a nurse to discuss her abdominal pain, pt did not disclose any further info,

## 2020-03-17 NOTE — Telephone Encounter (Signed)
Spoke with the pt and she wanted to try and get an appt sooner than her previous scheduled appt.for abd pain and abnormal pancreatic enzymes.    I offered her several appts for this week and the upcoming weeks with [name] however, she tells me that she takes care of 2 people and the dates offered did not work.  I have left her scheduled as is and she has been advised to call back if she would like to see if we have any cancellations that will work for her.  The pt has been advised of the information and verbalized understanding.

## 2020-03-17 NOTE — Telephone Encounter (Signed)
Pt is requesting a call back from a nurse in regards to another medication that has not been called in for her.

## 2020-03-17 NOTE — Telephone Encounter (Signed)
Left message on machine to call back /plp

## 2020-03-17 NOTE — Telephone Encounter (Signed)
Please send to the [place] thanks

## 2020-03-18 ENCOUNTER — Other Ambulatory Visit: Payer: Self-pay | Admitting: Physician Assistant

## 2020-03-18 NOTE — Telephone Encounter (Signed)
Pharmacy sent this through the system and is wanting to know if this is correct?

## 2020-03-19 ENCOUNTER — Telehealth: Payer: Self-pay | Admitting: Physician Assistant

## 2020-03-19 MED ORDER — AMBULATORY NON FORMULARY MEDICATION: 1 refills | Status: DC

## 2020-03-19 NOTE — Telephone Encounter (Signed)
Left voicemail for patient to return phone.

## 2020-03-19 NOTE — Telephone Encounter (Signed)
I have not seen Ms. Ruth Terry in quite a while I do not know why she is having pain radiating across her back.  She has history of IBS, and I was sending a prescription for Creon as her PCP messaged me that fecal elastase was abnormal when recently checked by the PCP and asked [place] to address that.  This would not have anything to do with her abdominal pain.  We can certainly get her worked in sooner than 4/20 - with me or primary GI MD in next couple weeks

## 2020-03-19 NOTE — Telephone Encounter (Signed)
Patient returned phone call and had multiple questions. She had multiple questions that I tried to answer the best I could. I advised on the ones I could not answer that I would discuss with [name] and return her phone call. The patient has concerns about all the pain she is having and that it is radiating across her back. She is wanting to know how long the pain will last? She is also wanting to know if she should go ahead and have some imaging done? She is wondering if she should be seen before the appointment she has scheduled.

## 2020-03-20 NOTE — Telephone Encounter (Signed)
Returned phone call to patient and discussed that her pain may actually be coming from her IBS. She stated that her pharmacy has her Creon ready so she is going to go pick it up and start on it today. I offered her a sooner appointment on March 30 th so she went ahead and decided to take the appointment time.

## 2020-03-27 ENCOUNTER — Encounter: Payer: Self-pay | Admitting: Family Medicine

## 2020-03-27 ENCOUNTER — Ambulatory Visit (INDEPENDENT_AMBULATORY_CARE_PROVIDER_SITE_OTHER): Payer: 59 | Admitting: Family Medicine

## 2020-03-27 ENCOUNTER — Other Ambulatory Visit: Payer: Self-pay

## 2020-03-27 VITALS — BP 119/81 | HR 101 | Temp 98.2°F | Ht 60.0 in | Wt 105.2 lb

## 2020-03-27 DIAGNOSIS — R21 Rash and other nonspecific skin eruption: Secondary | ICD-10-CM | POA: Diagnosis not present

## 2020-03-27 DIAGNOSIS — K6289 Other specified diseases of anus and rectum: Secondary | ICD-10-CM | POA: Diagnosis not present

## 2020-03-27 MED ORDER — CALMOL-4 76-10 % RE SUPP: 1.0000 | RECTAL | 1 refills | Status: DC | PRN

## 2020-03-27 MED ORDER — HYDROCORTISONE 1 % EX CREA: 1.0000 "application " | TOPICAL_CREAM | Freq: Two times a day (BID) | CUTANEOUS | 0 refills | Status: DC

## 2020-03-27 NOTE — Progress Notes (Signed)
Acute Office Visit  Subjective:    Patient ID: Ruth Terry, female    DOB: January 12, 1955, 65 y.o.   MRN: [id number]  Chief Complaint  Patient presents with  . Rectal Pain    HPI Patient is in today for rectal pain. She has a history of internal and external hemorrhoids as well as constipation.  For the last few days she has had increased rectal pain. She has been using preparation H without improvement. She did some external hemorrhoids last night. She reports that her constipation has been a little worse this week since starting Zenpep that her GI prescribed for low pancreatic enzymes. Her last BM was last night. She denies rectal bleeding or blood in stool. She denies weakness or dizziness.   She also noticed a rash on the lateral side of her left thigh this morning. It does not itch. She denies fever or drainage. She has not tried anything for the rash. Denies new products or medications other than Zenpep that was started last week.   Past Medical History:  Diagnosis Date  . Anal fissure   . Chronic constipation   . Diverticulosis 04-20-10   colonoscopy  . Gastroparesis   . GERD (gastroesophageal reflux disease)   . Hemorrhoids   . Hyperlipidemia   . IBS (irritable bowel syndrome)   . MVP (mitral valve prolapse)   . Polyp, stomach 04-20-10   egd  . Shingles   . UTI (lower urinary tract infection)     Past Surgical History:  Procedure Laterality Date  . COLONOSCOPY    . ESOPHAGOGASTRODUODENOSCOPY      Family History  Problem Relation Age of Onset  . Hypertension Mother   . Diabetes Father   . Heart attack Father        Age 34    Social History   Socioeconomic History  . Marital status: Married    Spouse name: Not on file  . Number of children: 0  . Years of education: Not on file  . Highest education level: Not on file  Occupational History  . Occupation: Interior and spatial designer    Comment: Dentist: [employer]  Tobacco  Use  . Smoking status: Former Smoker    Packs/day: 0.50    Years: 6.00    Pack years: 3.00    Types: Cigarettes  . Smokeless tobacco: Never Used  . Tobacco comment: USE SMOKELESS CIGARETTES  Vaping Use  . Vaping Use: Never used  Substance and Sexual Activity  . Alcohol use: Yes    Alcohol/week: 0.0 standard drinks    Comment: 1 a week, wine  . Drug use: No  . Sexual activity: Not on file  Other Topics Concern  . Not on file  Social History Narrative  . Not on file   Social Determinants of Health   Financial Resource Strain: Not on file  Food Insecurity: Not on file  Transportation Needs: Not on file  Physical Activity: Not on file  Stress: Not on file  Social Connections: Not on file  Intimate Partner Violence: Not on file    Outpatient Medications Prior to Visit  Medication Sig Dispense Refill  . AMBULATORY NON FORMULARY MEDICATION Medication Name: Domperidone 10 mg Take 1 tablet at bedtime daily. 90 tablet 1  . DEXILANT 60 MG capsule Take 1 capsule (60 mg total) by mouth daily. Symptoms refractory to Prevacid and Omeprazole.  New PA needed for dexilant. May need to submit as  brand 90 capsule 3  . ibandronate (BONIVA) 150 MG tablet Take 1 tablet (150 mg total) by mouth every 30 (thirty) days. Take in the morning with a full glass of water, on an empty stomach, and do not take anything else by mouth or lie down for the next 30 min. 3 tablet 0  . linaclotide (LINZESS) 145 MCG CAPS capsule Take 1 capsule (145 mcg total) by mouth daily before breakfast. 90 capsule 3  . mirtazapine (REMERON) 7.5 MG tablet Take 1 tablet (7.5 mg total) by mouth at bedtime. 30 tablet 0  . Probiotic Product (ALIGN) 4 MG CAPS Take 1 capsule by mouth daily.    . psyllium (METAMUCIL) 58.6 % packet Take 1 packet by mouth daily.    . Simethicone (PHAZYME MAXIMUM STRENGTH) 250 MG CAPS Take 250 mg by mouth 3 (three) times daily. (Patient taking differently: Take 250 mg by mouth 3 (three) times daily as  needed (gas relief).) 14 capsule   . valACYclovir (VALTREX) 1000 MG tablet Take 1 tablet (1,000 mg total) by mouth at bedtime. 90 tablet 4  . ZENPEP 3000-10000 units CPEP TAKE 1  BY MOUTH THREE TIMES DAILY WITH MEALS 90 capsule 6  . LORazepam (ATIVAN) 0.5 MG tablet Take 1 tablet 1 hour before MRI. May repeat 1 time 30 minutes later if needed. 2 tablet 0  . predniSONE (STERAPRED UNI-PAK 21 TAB) 10 MG (21) TBPK tablet Use as directed 21 tablet 0   No facility-administered medications prior to visit.    Allergies  Allergen Reactions  . Chlordiazepoxide-Clidinium Itching  . Ciprofloxacin Itching    Irritates stomach  . Flagyl [Metronidazole]     Severe yeast infection  . Sulfa Drugs Cross Reactors Nausea Only  . Cefdinir Nausea And Vomiting  . Nitrofurantoin Monohyd Macro Rash    Review of Systems As per HPI.    Objective:    Physical Exam Vitals and nursing note reviewed.  Constitutional:      General: She is not in acute distress.    Appearance: Normal appearance. She is not ill-appearing, toxic-appearing or diaphoretic.  HENT:     Head: Normocephalic and atraumatic.  Pulmonary:     Effort: Pulmonary effort is normal. No respiratory distress.  Genitourinary:    Rectum: No tenderness, anal fissure or external hemorrhoid.  Skin:    General: Skin is warm and dry.     Findings: Rash present. Rash is papular (lateral left thigh). Rash is not crusting, purpuric, pustular, scaling, urticarial or vesicular.  Neurological:     General: No focal deficit present.     Mental Status: She is alert and oriented to person, place, and time.  Psychiatric:        Mood and Affect: Mood normal.        Behavior: Behavior normal.     BP 119/81   Pulse (!) 101   Temp 98.2 F (36.8 C) (Temporal)   Ht 5' (1.524 m)   Wt 105 lb 4 oz (47.7 kg)   BMI 20.56 kg/m  Wt Readings from Last 3 Encounters:  03/27/20 105 lb 4 oz (47.7 kg)  03/10/20 105 lb (47.6 kg)  01/10/20 110 lb (49.9 kg)     Health Maintenance Due  Topic Date Due  . Hepatitis C Screening  Never done  . HIV Screening  Never done  . PAP SMEAR-Modifier  04/30/2018  . URINE MICROALBUMIN  05/08/2019  . MAMMOGRAM  06/10/2019    There are no preventive care reminders to  display for this patient.   Lab Results  Component Value Date   TSH 1.900 08/06/2019   Lab Results  Component Value Date   WBC 4.0 08/06/2019   HGB 14.6 08/06/2019   HCT 43.1 08/06/2019   MCV 87 08/06/2019   PLT 207 08/06/2019   Lab Results  Component Value Date   NA 140 03/10/2020   K 3.7 03/10/2020   CO2 22 03/10/2020   GLUCOSE 90 03/10/2020   BUN 6 (L) 03/10/2020   CREATININE 0.73 03/10/2020   BILITOT <0.2 03/10/2020   ALKPHOS 80 03/10/2020   AST 14 03/10/2020   ALT 10 03/10/2020   PROT 6.4 03/10/2020   ALBUMIN 4.3 03/10/2020   CALCIUM 9.3 03/10/2020   ANIONGAP 11 05/29/2019   GFR 74.22 09/12/2014   Lab Results  Component Value Date   CHOL 235 (H) 08/06/2019   Lab Results  Component Value Date   HDL 72 08/06/2019   Lab Results  Component Value Date   LDLCALC 140 (H) 08/06/2019   Lab Results  Component Value Date   TRIG 133 08/06/2019   Lab Results  Component Value Date   CHOLHDL 3.3 08/06/2019   Lab Results  Component Value Date   HGBA1C 5.5 05/08/2018       Assessment & Plan:   Milen was seen today for rectal pain.  Diagnoses and all orders for this visit:  Rectal pain No fissure noted. Suppository as below. Return to office for new or worsening symptoms.  -     Rectal Protectant-Emollient (CALMOL-4) 76-10 % SUPP; Place 1 suppository rectally as needed (for hemmorrhoids).  Rash Try hydrocortisone cream as below. Return to office for new or worsening symptoms, or if symptoms persist.  -     hydrocortisone cream 1 %; Apply 1 application topically 2 (two) times daily.  The patient indicates understanding of these issues and agrees with the plan.  Gabriel Earing, FNP

## 2020-04-02 ENCOUNTER — Ambulatory Visit (INDEPENDENT_AMBULATORY_CARE_PROVIDER_SITE_OTHER): Payer: 59 | Admitting: Physician Assistant

## 2020-04-02 ENCOUNTER — Other Ambulatory Visit: Payer: Self-pay

## 2020-04-02 ENCOUNTER — Encounter: Payer: Self-pay | Admitting: Physician Assistant

## 2020-04-02 VITALS — BP 128/82 | HR 82 | Wt 105.0 lb

## 2020-04-02 DIAGNOSIS — K589 Irritable bowel syndrome without diarrhea: Secondary | ICD-10-CM

## 2020-04-02 DIAGNOSIS — K3184 Gastroparesis: Secondary | ICD-10-CM | POA: Diagnosis not present

## 2020-04-02 DIAGNOSIS — K5909 Other constipation: Secondary | ICD-10-CM

## 2020-04-02 DIAGNOSIS — K8689 Other specified diseases of pancreas: Secondary | ICD-10-CM

## 2020-04-02 DIAGNOSIS — R1010 Upper abdominal pain, unspecified: Secondary | ICD-10-CM | POA: Diagnosis not present

## 2020-04-02 DIAGNOSIS — R1084 Generalized abdominal pain: Secondary | ICD-10-CM

## 2020-04-02 NOTE — Progress Notes (Signed)
Subjective:    Patient ID: Ruth Terry, female    DOB: 09/29/1955, 66 y.o.   MRN: [id number]  HPI Ruth Terry is a pleasant 65 year old white female, established with Dr. Marina Goodell with history of IBS, chronic constipation, GERD, gastroparesis, diverticulosis and chronic dyspepsia. She was last seen in June 2021 by myself. Last colonoscopy was done in 2012 per Dr. Jarold Motto and she will be due for follow-up this summer. I was recently notified by her PCP that a fecal elastase was done and abnormal and we were asked to address.  She has been started on Zenpep 300,,one  with each meal.  She comes in today for follow-up. She mentions that she is still under a lot of personal stress, her husband is now in hospice care, her mother has cancer and her father is also chronically ill. Patient says she is very busy and generally only eats 2 meals per day.  She has not noticed any change in her symptoms with addition of Zenpep.  She says this was done because she was complaining of alteration in her bowel habits with alternating looser stools and constipation. This is in the setting of ongoing use of domperidone, MiraLAX and Linzess, as well as Dexilant. She says she has not been feeling well at all recently over the past couple of months and she says something is off and feels different in her abdomen which she does not feel is her IBS.  She says she is uncomfortable all the time with pains across the upper and mid abdomen that radiate into her sides.  She is also noticed some pain radiating up into the right shoulder at times.  Continues to have issues with gas and bloating.  Also continues periodically with alternating bowel habits.  No melena or hematochezia.  She feels more discomfort in her right abdomen and is worried about her gallbladder. She is not been on any NSAIDs or any other new medicines. She did have baseline labs done earlier in March which were unrevealing.  Review of Systems Pertinent positive  and negative review of systems were noted in the above HPI section.  All other review of systems was otherwise negative.  Outpatient Encounter Medications as of 04/02/2020  Medication Sig  . AMBULATORY NON FORMULARY MEDICATION Medication Name: Domperidone 10 mg Take 1 tablet at bedtime daily.  Marland Kitchen DEXILANT 60 MG capsule Take 1 capsule (60 mg total) by mouth daily. Symptoms refractory to Prevacid and Omeprazole.  New PA needed for dexilant. May need to submit as brand  . hydrocortisone cream 1 % Apply 1 application topically 2 (two) times daily.  Marland Kitchen ibandronate (BONIVA) 150 MG tablet Take 1 tablet (150 mg total) by mouth every 30 (thirty) days. Take in the morning with a full glass of water, on an empty stomach, and do not take anything else by mouth or lie down for the next 30 min.  Marland Kitchen linaclotide (LINZESS) 145 MCG CAPS capsule Take 1 capsule (145 mcg total) by mouth daily before breakfast.  . mirtazapine (REMERON) 7.5 MG tablet Take 1 tablet (7.5 mg total) by mouth at bedtime.  . Probiotic Product (ALIGN) 4 MG CAPS Take 1 capsule by mouth daily.  . psyllium (METAMUCIL) 58.6 % packet Take 1 packet by mouth daily.  . Rectal Protectant-Emollient (CALMOL-4) 76-10 % SUPP Place 1 suppository rectally as needed (for hemmorrhoids).  . Simethicone (PHAZYME MAXIMUM STRENGTH) 250 MG CAPS Take 250 mg by mouth 3 (three) times daily. (Patient taking differently: Take 250 mg  by mouth 3 (three) times daily as needed (gas relief).)  . valACYclovir (VALTREX) 1000 MG tablet Take 1 tablet (1,000 mg total) by mouth at bedtime.  Marland Kitchen ZENPEP 3000-10000 units CPEP TAKE 1  BY MOUTH THREE TIMES DAILY WITH MEALS  . [DISCONTINUED] AMBULATORY NON FORMULARY MEDICATION Medication Name: Domperidone 10 mg Take 1 tablet at bedtime daily. (Patient taking differently: Take 1 tablet by mouth at bedtime. Medication Name: Domperidone 10 mg Take 1 tablet at bedtime daily.)   No facility-administered encounter medications on file as of  04/02/2020.   Allergies  Allergen Reactions  . Chlordiazepoxide-Clidinium Itching  . Ciprofloxacin Itching    Irritates stomach  . Flagyl [Metronidazole]     Severe yeast infection  . Sulfa Drugs Cross Reactors Nausea Only  . Cefdinir Nausea And Vomiting  . Nitrofurantoin Monohyd Macro Rash   Patient Active Problem List   Diagnosis Date Noted  . History of shingles 04/24/2019  . Chronic midline low back pain without sciatica 02/27/2019  . Chronic idiopathic constipation 04/26/2018  . Aortic atherosclerosis (HCC) 03/01/2017  . Rib fracture 12/07/2016  . Gas pain 03/09/2016  . Anal itching 03/09/2016  . Elevated LDL cholesterol level 10/24/2015  . Internal hemorrhoids 05/14/2014  . GERD (gastroesophageal reflux disease) 09/17/2011  . IBS (irritable bowel syndrome) 08/25/2010  . Gastroparesis 06/30/2010  . Non-ulcer dyspepsia 04/03/2010   Social History   Socioeconomic History  . Marital status: Married    Spouse name: Not on file  . Number of children: 0  . Years of education: Not on file  . Highest education level: Not on file  Occupational History  . Occupation: Interior and spatial designer    Comment: Dentist: [employer]  Tobacco Use  . Smoking status: Former Smoker    Packs/day: 0.50    Years: 6.00    Pack years: 3.00    Types: Cigarettes  . Smokeless tobacco: Never Used  . Tobacco comment: USE SMOKELESS CIGARETTES  Vaping Use  . Vaping Use: Never used  Substance and Sexual Activity  . Alcohol use: Yes    Alcohol/week: 0.0 standard drinks    Comment: 1 a week, wine  . Drug use: No  . Sexual activity: Not on file  Other Topics Concern  . Not on file  Social History Narrative  . Not on file   Social Determinants of Health   Financial Resource Strain: Not on file  Food Insecurity: Not on file  Transportation Needs: Not on file  Physical Activity: Not on file  Stress: Not on file  Social Connections: Not on file   Intimate Partner Violence: Not on file    Ruth Terry's family history includes Diabetes in her father; Heart attack in her father; Hypertension in her mother.      Objective:    Vitals:   04/02/20 1052  BP: 128/82  Pulse: 82    Physical Exam Well-developed well-nourished WF in no acute distress.  Height, Weight, BMI 20.51  HEENT; nontraumatic normocephalic, EOMI, PE R LA, sclera anicteric. Oropharynx; not examined today Neck; supple, no JVD Cardiovascular; regular rate and rhythm with S1-S2, no murmur rub or gallop Pulmonary; Clear bilaterally Abdomen; soft, there is mild tenderness across the upper abdomen no guarding or rebound, nondistended, no palpable mass or hepatosplenomegaly, bowel sounds are active Rectal; not done today Skin; benign exam, no jaundice rash or appreciable lesions Extremities; no clubbing cyanosis or edema skin warm and dry Neuro/Psych; alert and oriented x4,  grossly nonfocal mood and affect appropriate       Assessment & Plan:   #24   65 year old white female with long history of IBS, constipation predominant maintained on MiraLAX and Linzess 145 mcg daily who recently had fecal elastase done by her PCP which was low.  I am not sure that her symptoms are really consistent with pancreatic insufficiency, nevertheless she has been started on low-dose Zen Pep with no change in her symptoms thus far. Patient complaining of increase in abdominal pain over the past couple of months across the abdomen and more notable in the right abdomen sometimes radiating into the right shoulder.  She has ongoing complaints of bloating and gas and continues with primarily constipation but occasional loose stool.  I suspect her symptoms are all secondary to combination of her IBS, constipation, and gastroparesis, rule out other intra-abdominal pathology/inflammatory process/neoplasm  #2 mild pancreatic insufficiency with low fecal elastase #3 colon cancer screening-due for  colonoscopy this year last colonoscopy 2012  #4 chronic anxiety #5 chronic GERD  Plan; CT of the abdomen pelvis with contrast. We will proceed with breath testing to rule out SIBO and treat if positive Continue current regimen of medications, Dexilant 60 mg p.o. every morning Continue Linzess 145 mcg daily Continue MiraLAX 17 g in 8 ounces of water daily Continue domperidone 10 mg daily Will increase Zenpep to 3000-10,000, 2 p.o. AC  If CT and breath testing are unremarkable will need EGD and colonoscopy scheduled with Dr. Marina Goodell  Plan    Inez Stantz S Essynce Munsch PA-C 04/02/2020   Cc: Raliegh Ip, DO

## 2020-04-02 NOTE — Patient Instructions (Addendum)
If you are age 63 or older, your body mass index should be between 23-30. Your Body mass index is 20.51 kg/m. If this is out of the aforementioned range listed, please consider follow up with your Primary Care Provider.  If you are age 61 or younger, your body mass index should be between 19-25. Your Body mass index is 20.51 kg/m. If this is out of the aformentioned range listed, please consider follow up with your Primary Care Provider.   You have been given a testing kit to check for small intestine bacterial overgrowth (SIBO) which is completed by a company named [employer]. Make sure to return your test in the mail using the return mailing label given to you along with the kit. Your demographic and insurance information have already been sent to the company and they should be in contact with you over the next week regarding this test. [facility] will collect an upfront charge of $99.74 for commercial insurance plans and $209.74 is you are paying cash. Make sure to discuss with [facility] PRIOR to having the test if they have gotten informatoin from your insurance company as to how much your testing will cost out of pocket, if any. Please keep in mind that you will be getting a call from phone number [phone] or a similar number. If you do not hear from them within this time frame, please call our office at [phone].   You have been scheduled for a CT scan of the abdomen and pelvis at [facility], 1st floor Radiology. You are scheduled on 04/29/2020  at 4:00 pm. You should arrive 15 minutes prior to your appointment time for registration.  Please pick up 2 bottles of contrast from [place] at least 3 days prior to your scan. The solution may taste better if refrigerated, but do NOT add ice or any other liquid to this solution. Shake well before drinking.   Please follow the written instructions below on the day of your exam:   1) Do not eat anything after 12:00 pm (4 hours  prior to your test)   2) Drink 1 bottle of contrast @ 2:00 pm (2 hours prior to your exam)  Remember to shake well before drinking and do NOT pour over ice.     Drink 1 bottle of contrast @ 3:00 pm (1 hour prior to your exam)   You may take any medications as prescribed with a small amount of water, if necessary. If you take any of the following medications: METFORMIN, GLUCOPHAGE, GLUCOVANCE, AVANDAMET, RIOMET, FORTAMET, [place] MET, JANUMET, GLUMETZA or METAGLIP, you MAY be asked to HOLD this medication 48 hours AFTER the exam.   The purpose of you drinking the oral contrast is to aid in the visualization of your intestinal tract. The contrast solution may cause some diarrhea. Depending on your individual set of symptoms, you may also receive an intravenous injection of x-ray contrast/dye. Plan on being at [facility] for 45 minutes or longer, depending on the type of exam you are having performed.   If you have any questions regarding your exam or if you need to reschedule, you may call [facility] at [phone] between the hours of 8:00 am and 5:00 pm, Monday-Friday.   Increase Zenpep to 2 before each meals.  Continue all your other medications as directed.  Follow up pending or as needed.  Thank you for entrusting me with your care and choosing [facility].  [name] Esterwood, PA-C

## 2020-04-02 NOTE — Progress Notes (Signed)
Assessment and plan reviewed 

## 2020-04-09 ENCOUNTER — Ambulatory Visit: Payer: 59 | Admitting: Family Medicine

## 2020-04-09 ENCOUNTER — Encounter: Payer: Self-pay | Admitting: Family Medicine

## 2020-04-09 ENCOUNTER — Telehealth: Payer: Self-pay | Admitting: Physician Assistant

## 2020-04-09 DIAGNOSIS — R350 Frequency of micturition: Secondary | ICD-10-CM

## 2020-04-09 LAB — URINALYSIS, COMPLETE
Bilirubin, UA: NEGATIVE
Glucose, UA: NEGATIVE
Ketones, UA: NEGATIVE
Leukocytes,UA: NEGATIVE
Nitrite, UA: NEGATIVE
Protein,UA: NEGATIVE
RBC, UA: NEGATIVE
Specific Gravity, UA: <1.005 — ABNORMAL LOW (ref 1.005–1.030)
Urobilinogen, Ur: 0.2 mg/dL (ref 0.2–1.0)
pH, UA: 6 (ref 5.0–7.5)

## 2020-04-09 LAB — MICROSCOPIC EXAMINATION
Bacteria, UA: NONE SEEN
RBC, Urine: NONE SEEN /hpf (ref 0–2)
WBC, UA: NONE SEEN /hpf (ref 0–5)

## 2020-04-09 NOTE — Progress Notes (Signed)
Virtual Visit via Telephone Note  I connected with Ruth Terry on 04/09/20 at 12:17 PM by telephone and verified that I am speaking with the correct person using two identifiers. Ruth Terry is currently located at home and her husband is currently with her during this visit. The provider, Loman Brooklyn, FNP is located in their home at time of visit.  I discussed the limitations, risks, security and privacy concerns of performing an evaluation and management service by telephone and the availability of in person appointments. I also discussed with the patient that there may be a patient responsible charge related to this service. The patient expressed understanding and agreed to proceed.  Subjective: PCP: [name], DO  Chief Complaint  Patient presents with  . Urinary Tract Infection   Urinary Tract Infection: Patient complains of frequency She has had symptoms for a few days. Patient also complains of back pain. Patient denies fever. Patient does have a history of recurrent UTI.  Patient does not have a history of pyelonephritis.    ROS: Per HPI  Current Outpatient Medications:  .  AMBULATORY NON FORMULARY MEDICATION, Medication Name: Domperidone 10 mg Take 1 tablet at bedtime daily., Disp: 90 tablet, Rfl: 1 .  DEXILANT 60 MG capsule, Take 1 capsule (60 mg total) by mouth daily. Symptoms refractory to Prevacid and Omeprazole.  New PA needed for dexilant. May need to submit as brand, Disp: 90 capsule, Rfl: 3 .  hydrocortisone cream 1 %, Apply 1 application topically 2 (two) times daily., Disp: 30 g, Rfl: 0 .  ibandronate (BONIVA) 150 MG tablet, Take 1 tablet (150 mg total) by mouth every 30 (thirty) days. Take in the morning with a full glass of water, on an empty stomach, and do not take anything else by mouth or lie down for the next 30 min., Disp: 3 tablet, Rfl: 0 .  linaclotide (LINZESS) 145 MCG CAPS capsule, Take 1 capsule (145 mcg total) by mouth daily before  breakfast., Disp: 90 capsule, Rfl: 3 .  mirtazapine (REMERON) 7.5 MG tablet, Take 1 tablet (7.5 mg total) by mouth at bedtime., Disp: 30 tablet, Rfl: 0 .  Probiotic Product (ALIGN) 4 MG CAPS, Take 1 capsule by mouth daily., Disp: , Rfl:  .  psyllium (METAMUCIL) 58.6 % packet, Take 1 packet by mouth daily., Disp: , Rfl:  .  Rectal Protectant-Emollient (CALMOL-4) 76-10 % SUPP, Place 1 suppository rectally as needed (for hemmorrhoids)., Disp: 30 suppository, Rfl: 1 .  Simethicone (PHAZYME MAXIMUM STRENGTH) 250 MG CAPS, Take 250 mg by mouth 3 (three) times daily. (Patient taking differently: Take 250 mg by mouth 3 (three) times daily as needed (gas relief).), Disp: 14 capsule, Rfl:  .  valACYclovir (VALTREX) 1000 MG tablet, Take 1 tablet (1,000 mg total) by mouth at bedtime., Disp: 90 tablet, Rfl: 4 .  ZENPEP 3000-10000 units CPEP, TAKE 1  BY MOUTH THREE TIMES DAILY WITH MEALS, Disp: 90 capsule, Rfl: 6  Allergies  Allergen Reactions  . Chlordiazepoxide-Clidinium Itching  . Ciprofloxacin Itching    Irritates stomach  . Flagyl [Metronidazole]     Severe yeast infection  . Sulfa Drugs Cross Reactors Nausea Only  . Cefdinir Nausea And Vomiting  . Nitrofurantoin Monohyd Macro Rash   Past Medical History:  Diagnosis Date  . Anal fissure   . Chronic constipation   . Diverticulosis 04-20-10   colonoscopy  . Gastroparesis   . GERD (gastroesophageal reflux disease)   . Hemorrhoids   .  Hyperlipidemia   . IBS (irritable bowel syndrome)   . MVP (mitral valve prolapse)   . Polyp, stomach 04-20-10   egd  . Shingles   . UTI (lower urinary tract infection)     Observations/Objective: .bjbos   Assessment and Plan: 1. Urinary frequency Patient is going to come leave a urine sample this afternoon and we will treat from there. - Urinalysis, Complete; Future - Urine Culture; Future   Follow Up Instructions:  I discussed the assessment and treatment plan with the patient. The patient was  provided an opportunity to ask questions and all were answered. The patient agreed with the plan and demonstrated an understanding of the instructions.   The patient was advised to call back or seek an in-person evaluation if the symptoms worsen or if the condition fails to improve as anticipated.  The above assessment and management plan was discussed with the patient. The patient verbalized understanding of and has agreed to the management plan. Patient is aware to call the clinic if symptoms persist or worsen. Patient is aware when to return to the clinic for a follow-up visit. Patient educated on when it is appropriate to go to the emergency department.   Time call ended: 12:23 PM  I provided 6 minutes of non-face-to-face time during this encounter.  Hendricks Limes, MSN, APRN, FNP-C [facility] 04/09/20

## 2020-04-09 NOTE — Telephone Encounter (Signed)
Patient called in requesting a call back regarding pain she is in and medication she is taking.

## 2020-04-09 NOTE — Addendum Note (Signed)
Addended by: Collier Bullock on: 04/09/2020 02:58 PM   Modules accepted: Orders

## 2020-04-09 NOTE — Telephone Encounter (Signed)
Called patient back, she was calling wanting to know if we could get her CT moved up since it was so far out. I did advised that due to staffing and location, that she was scheduled in the first available at [facility]. I did call scheduling and they were unable to move her appointment up. I have since called the patient and let her know. I advised her that she can call scheduling and see if they have had anything open up.

## 2020-04-11 LAB — URINE CULTURE

## 2020-04-23 ENCOUNTER — Ambulatory Visit: Payer: 59 | Admitting: Physician Assistant

## 2020-04-24 ENCOUNTER — Telehealth: Payer: Self-pay | Admitting: Physician Assistant

## 2020-04-24 NOTE — Telephone Encounter (Signed)
Inbound call from patient requesting call please.  States she no longer can receive the medication that is sent from [place] and is requesting alternate medication.  Please advise.

## 2020-04-25 NOTE — Telephone Encounter (Signed)
Left voicemail for patient to return phone call and let me know which medication she gets from the [employer].

## 2020-04-25 NOTE — Telephone Encounter (Signed)
The only medication similar to Domperidone is Reglan - if pt has hd reglan in the past and tolerated then ok to send Rx for 5 mg metoclopramide   1 hr ac  #50 /0

## 2020-04-25 NOTE — Telephone Encounter (Signed)
Spoke with patient she has not received her Domperidone which comes from [place]. She has received a letter from the FDA about it not being approved in the [place]. She stated that her medication has been stuck in customs in [place] for the past 2 weeks, so she has not had any of the medication in the past two weeks. She is wondering about options for a replacement medication.

## 2020-04-28 MED ORDER — METOCLOPRAMIDE HCL 5 MG PO TABS: 5.0000 mg | ORAL_TABLET | Freq: Three times a day (TID) | ORAL | 3 refills | Status: DC

## 2020-04-28 NOTE — Telephone Encounter (Signed)
Called and spoke with patient about switching to Reglan. I advised that she would be taking this before meals. She did advise that she did not recall taking it in the past. She advised me to send it to [facility].

## 2020-04-29 ENCOUNTER — Ambulatory Visit (HOSPITAL_COMMUNITY)
Admission: RE | Admit: 2020-04-29 | Discharge: 2020-04-29 | Disposition: A | Discharge status: Home / Self Care | Payer: 59 | Source: Ambulatory Visit | Attending: Physician Assistant | Admitting: Physician Assistant

## 2020-04-29 ENCOUNTER — Other Ambulatory Visit: Payer: Self-pay

## 2020-04-29 ENCOUNTER — Encounter (HOSPITAL_COMMUNITY): Payer: Self-pay | Admitting: Radiology

## 2020-04-29 DIAGNOSIS — K589 Irritable bowel syndrome without diarrhea: Secondary | ICD-10-CM | POA: Diagnosis present

## 2020-04-29 DIAGNOSIS — R1084 Generalized abdominal pain: Secondary | ICD-10-CM | POA: Diagnosis not present

## 2020-04-29 DIAGNOSIS — K5909 Other constipation: Secondary | ICD-10-CM

## 2020-04-29 DIAGNOSIS — K3184 Gastroparesis: Secondary | ICD-10-CM | POA: Diagnosis present

## 2020-04-29 DIAGNOSIS — K8689 Other specified diseases of pancreas: Secondary | ICD-10-CM | POA: Diagnosis present

## 2020-04-29 DIAGNOSIS — R1010 Upper abdominal pain, unspecified: Secondary | ICD-10-CM

## 2020-04-29 LAB — POCT I-STAT CREATININE: Creatinine, Ser: 0.8 mg/dL (ref 0.44–1.00)

## 2020-04-29 IMAGING — CT CT ABD-PELV W/ CM
2 of 5 series · 17 of 46 positions shown, 19 images · IV contrast (Omnipaque or Isovue)
Comparison: [DATE]

CLINICAL DATA: Worsening abdominal pain for 2 months. Colonic
constipation.

EXAM:
CT ABDOMEN AND PELVIS WITH CONTRAST
TECHNIQUE: Multidetector CT imaging of the abdomen and pelvis was performed
using the standard protocol following bolus administration of
intravenous contrast.
CONTRAST:  80mL OMNIPAQUE IOHEXOL 300 MG/ML  SOLN

[Series 2: axial st · axial · 0.63mm/px · z∈[+918,+1298]mm · 14 of 86 slices shown, 16 images]
[im 5/86  soft-tissue]
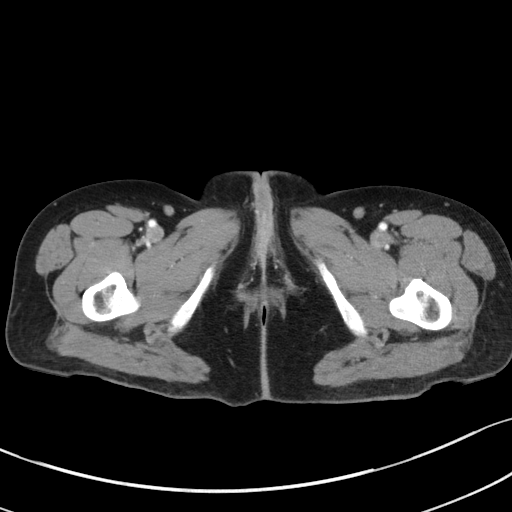
[im 5/86  bone]
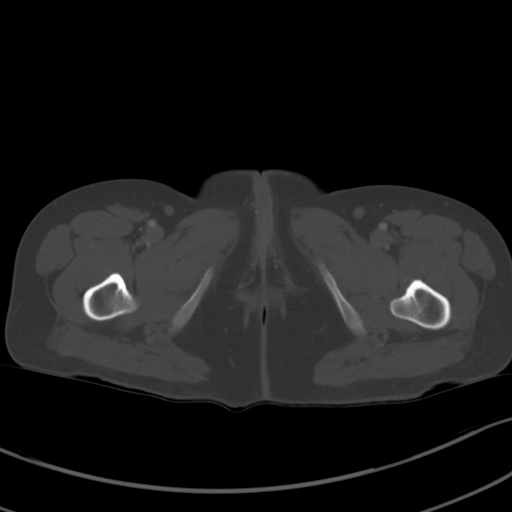
[im 10/86  soft-tissue]
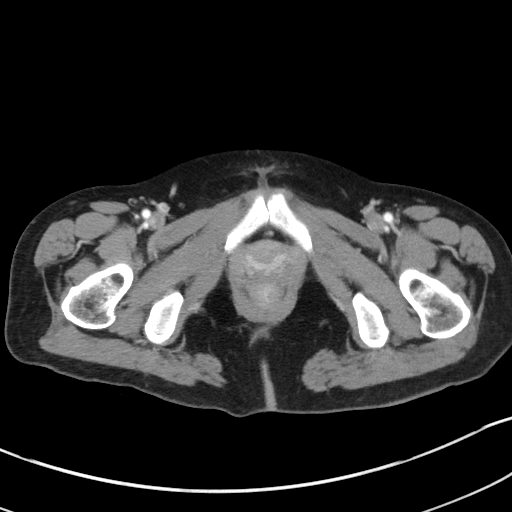
[im 19/86  soft-tissue]
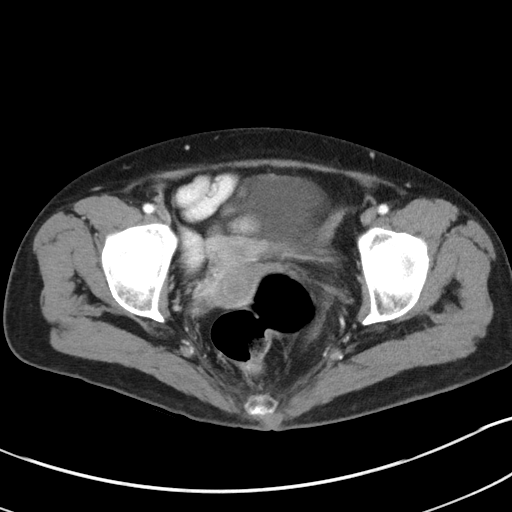
[im 24/86  soft-tissue]
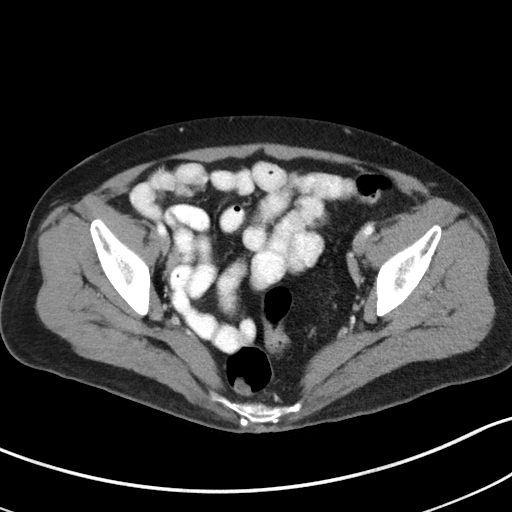
[im 29/86  soft-tissue]
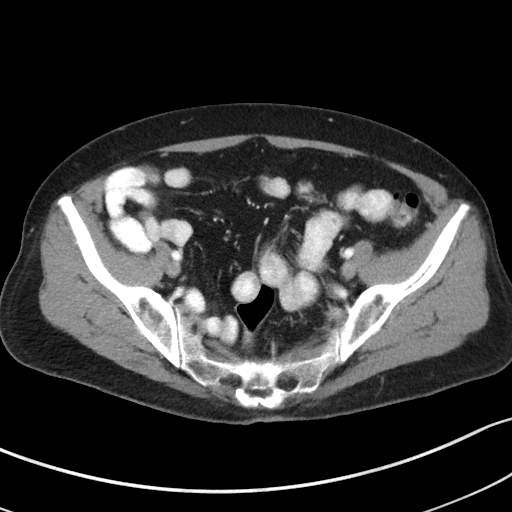
[im 34/86  soft-tissue]
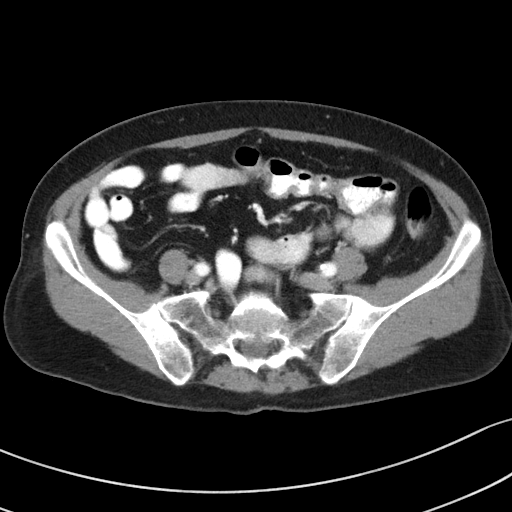
[im 38/86  soft-tissue]
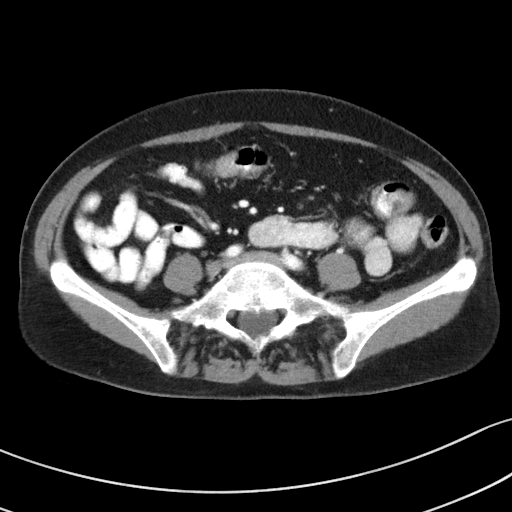
[im 48/86  soft-tissue]
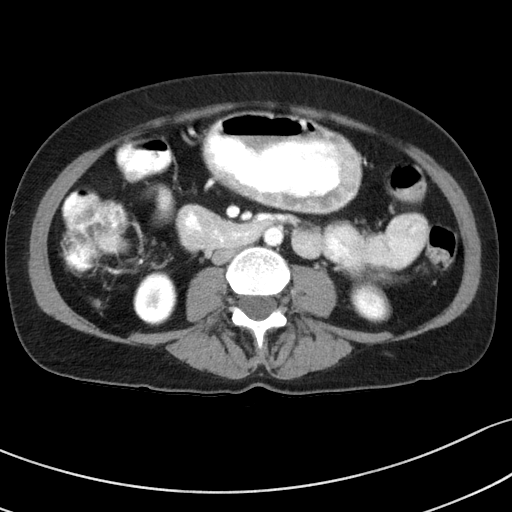
[im 52/86  soft-tissue]
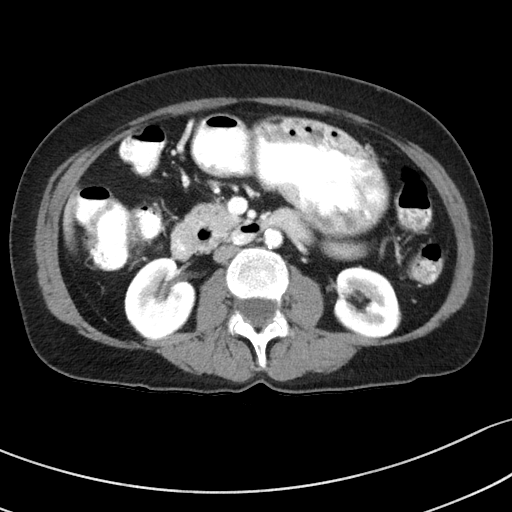
[im 52/86  bone]
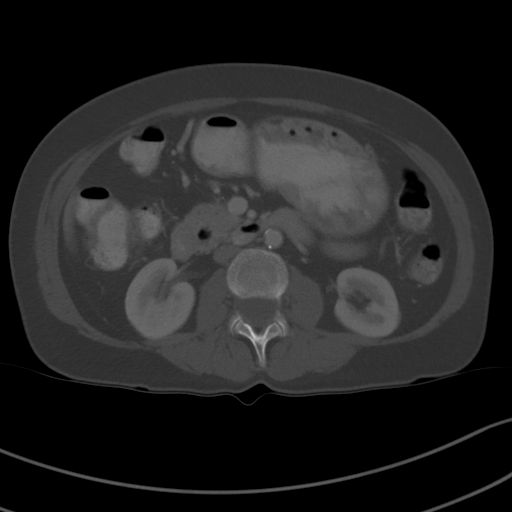
[im 57/86  soft-tissue]
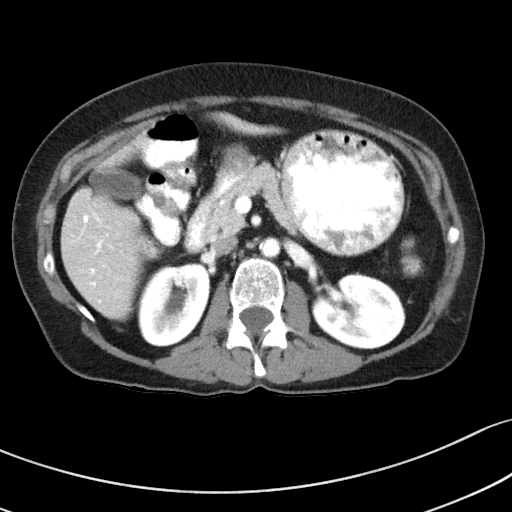
[im 62/86  soft-tissue]
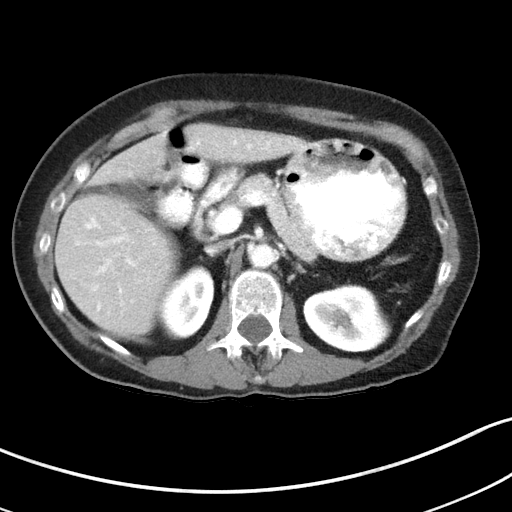
[im 67/86  soft-tissue]
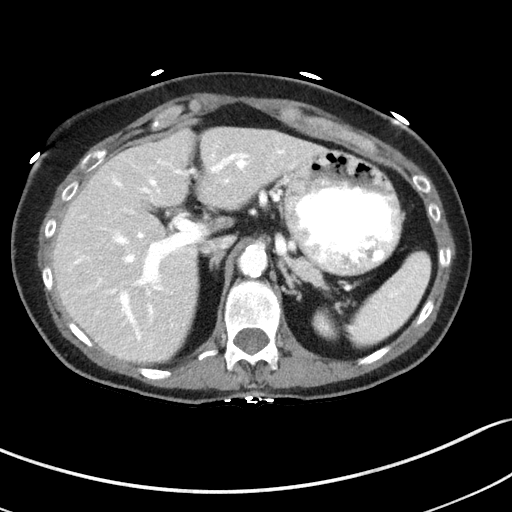
[im 76/86  soft-tissue]
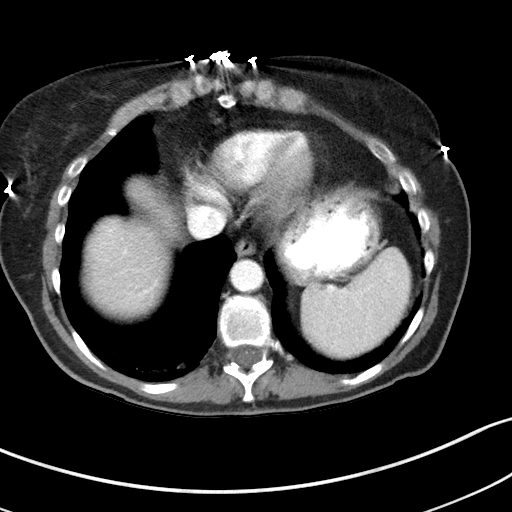
[im 81/86  soft-tissue]
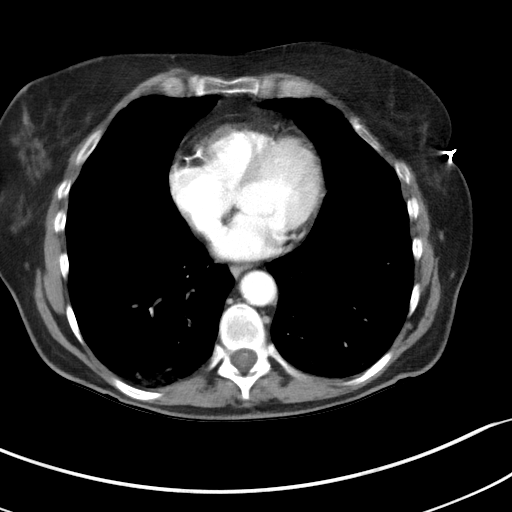

[Series 6: coronal st · coronal · 0.74mm/px · 3 of 117 slices shown]
[im 39/117  soft-tissue]
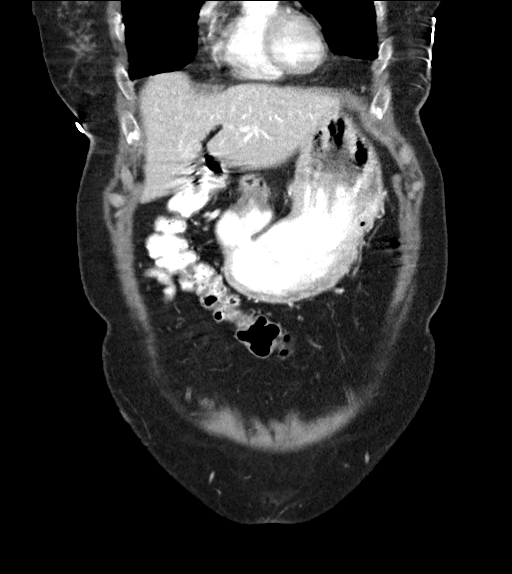
[im 52/117  soft-tissue]
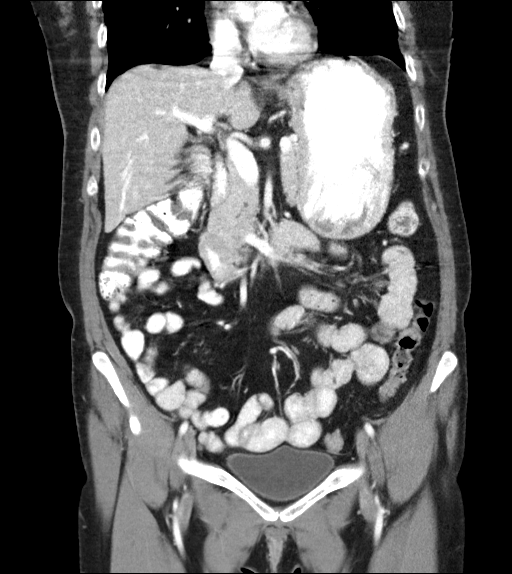
[im 65/117  soft-tissue]
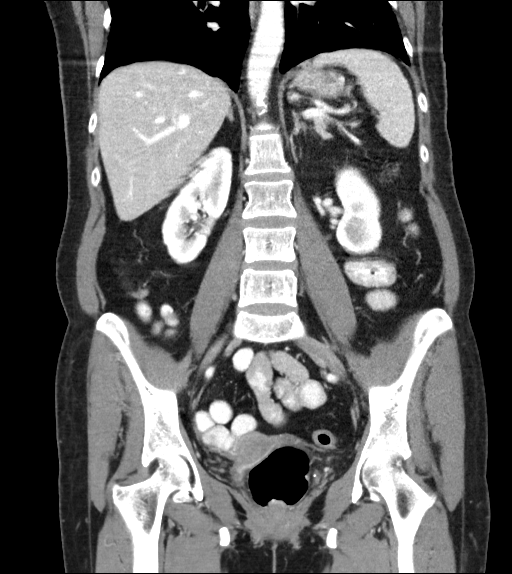

[17 of 46 positions shown; findings below may reference images not displayed]

FINDINGS: Lower Chest: No acute findings.

Hepatobiliary: No hepatic masses identified. Gallbladder is
unremarkable. No evidence of biliary ductal dilatation.

Pancreas:  No mass or inflammatory changes.

Spleen: Within normal limits in size and appearance.

Adrenals/Urinary Tract: No masses identified. No evidence of
ureteral calculi or hydronephrosis.

Stomach/Bowel: No evidence of obstruction, inflammatory process or
abnormal fluid collections. Normal appendix visualized.

Vascular/Lymphatic: No pathologically enlarged lymph nodes. No acute
vascular findings. Aortic atherosclerotic calcification noted.

Reproductive:  No mass or other significant abnormality.

Other:  None.

Musculoskeletal:  No suspicious bone lesions identified.
IMPRESSION: Negative. No acute findings or other significant abnormality.

Aortic Atherosclerosis ([4B]-[4B]).

## 2020-04-29 MED ORDER — IOHEXOL 300 MG/ML  SOLN
80.0000 mL | Freq: Once | INTRAMUSCULAR | Status: AC | PRN
  Administered 2020-04-29: 80 mL via INTRAVENOUS

## 2020-05-02 ENCOUNTER — Encounter: Payer: Self-pay | Admitting: Internal Medicine

## 2020-05-08 ENCOUNTER — Telehealth: Payer: Self-pay | Admitting: Physician Assistant

## 2020-05-08 DIAGNOSIS — K8689 Other specified diseases of pancreas: Secondary | ICD-10-CM

## 2020-05-08 NOTE — Telephone Encounter (Signed)
Patient says Zenpep makes her gassy and has cramps. She wants to know if she can have her "level checked again" and can she "have a glass of wine while on ZenPep?" Thanks

## 2020-05-08 NOTE — Telephone Encounter (Signed)
Inbound call from patient requesting results from CT scan as well as other questions. Best contact number [phone]

## 2020-05-12 NOTE — Telephone Encounter (Signed)
Left message on machine to call back  

## 2020-05-12 NOTE — Telephone Encounter (Signed)
Fecal elastase was abnormal - ok to repeat if pt desires - however once this is diagnosed not generally going to go away - if she feels worse on Zenpep she can stop it

## 2020-05-15 NOTE — Telephone Encounter (Signed)
Patient notified of recommendations  She wants to recheck the fecal elastase.  She will come here or request her PCP to repeat it.

## 2020-05-16 ENCOUNTER — Ambulatory Visit: Payer: 59 | Admitting: Family Medicine

## 2020-05-16 ENCOUNTER — Encounter: Payer: Self-pay | Admitting: Family Medicine

## 2020-05-16 ENCOUNTER — Other Ambulatory Visit: Payer: Self-pay

## 2020-05-16 VITALS — BP 120/75 | HR 106 | Temp 98.5°F | Ht 60.0 in | Wt 105.6 lb

## 2020-05-16 DIAGNOSIS — K8689 Other specified diseases of pancreas: Secondary | ICD-10-CM | POA: Diagnosis not present

## 2020-05-16 DIAGNOSIS — N3000 Acute cystitis without hematuria: Secondary | ICD-10-CM | POA: Diagnosis not present

## 2020-05-16 DIAGNOSIS — H6993 Unspecified Eustachian tube disorder, bilateral: Secondary | ICD-10-CM

## 2020-05-16 DIAGNOSIS — B3731 Acute candidiasis of vulva and vagina: Secondary | ICD-10-CM

## 2020-05-16 DIAGNOSIS — B370 Candidal stomatitis: Secondary | ICD-10-CM

## 2020-05-16 DIAGNOSIS — H6983 Other specified disorders of Eustachian tube, bilateral: Secondary | ICD-10-CM

## 2020-05-16 DIAGNOSIS — M549 Dorsalgia, unspecified: Secondary | ICD-10-CM | POA: Diagnosis not present

## 2020-05-16 DIAGNOSIS — B373 Candidiasis of vulva and vagina: Secondary | ICD-10-CM

## 2020-05-16 LAB — URINALYSIS, COMPLETE
Bilirubin, UA: NEGATIVE
Glucose, UA: NEGATIVE
Leukocytes,UA: NEGATIVE
Nitrite, UA: POSITIVE — AB
Protein,UA: NEGATIVE
RBC, UA: NEGATIVE
Specific Gravity, UA: 1.02 (ref 1.005–1.030)
Urobilinogen, Ur: 1 mg/dL (ref 0.2–1.0)
pH, UA: 5.5 (ref 5.0–7.5)

## 2020-05-16 LAB — MICROSCOPIC EXAMINATION: RBC, Urine: NONE SEEN /hpf (ref 0–2)

## 2020-05-16 MED ORDER — MAGIC MOUTHWASH: 5.0000 mL | Freq: Three times a day (TID) | ORAL | 0 refills | Status: DC | PRN

## 2020-05-16 MED ORDER — CEPHALEXIN 500 MG PO CAPS: 500.0000 mg | ORAL_CAPSULE | Freq: Two times a day (BID) | ORAL | 0 refills | Status: AC

## 2020-05-16 MED ORDER — FLUCONAZOLE 150 MG PO TABS: 150.0000 mg | ORAL_TABLET | Freq: Once | ORAL | 0 refills | Status: AC

## 2020-05-16 NOTE — Patient Instructions (Signed)
I've ordered lipase/ amylase.  The lab I ordered initially was a stool elastase.  Not sure what exactly they wanted recollected, but I've ordered all of them.

## 2020-05-16 NOTE — Progress Notes (Signed)
Subjective: CC: Multiple concerns PCP: Ruth Norlander, DO PZW:CHENIDP L Prim is a 65 y.o. female presenting to clinic today for:  1.  Chronic abdominal pain Patient with ongoing abdominal discomfort.  She had a CAT scan performed recently which showed no pancreatic lesions thankfully.  She was placed on Zenpep for pancreatic insufficiency but notes that this caused GI issues and therefore she discontinued about a week ago.  It was recommended by her GI doctor to repeat her levels and she is asking to have this done today.  She does report that she just started back on her motility drug after being off of it for 5 weeks due to issues getting it from [place]  2.  Vaginal irritation Patient reports pelvic pain and vaginal irritation.  She wonders if she might have a yeast infection or urinary tract infection.  No observed hematuria.  Does not report any vaginal itching but she just feels irritated  3.  Oral irritation Patient also reports some oral irritation.  She notes that her tongue feels thick.  Denies any overt drainage.  She has kept a dry mouth since being sick with COVID.  She tries hydrating adequately and using Biotene for this but sometimes she just feels like she cannot form her words due to this irritation in her mouth   ROS: Per HPI  Allergies  Allergen Reactions  . Chlordiazepoxide-Clidinium Itching  . Ciprofloxacin Itching    Irritates stomach  . Flagyl [Metronidazole]     Severe yeast infection  . Sulfa Drugs Cross Reactors Nausea Only  . Cefdinir Nausea And Vomiting  . Nitrofurantoin Monohyd Macro Rash   Past Medical History:  Diagnosis Date  . Anal fissure   . Chronic constipation   . Diverticulosis 04-20-10   colonoscopy  . Gastroparesis   . GERD (gastroesophageal reflux disease)   . Hemorrhoids   . Hyperlipidemia   . IBS (irritable bowel syndrome)   . MVP (mitral valve prolapse)   . Polyp, stomach 04-20-10   egd  . Shingles   . UTI (lower urinary  tract infection)     Current Outpatient Medications:  .  AMBULATORY NON FORMULARY MEDICATION, Medication Name: Domperidone 10 mg Take 1 tablet at bedtime daily., Disp: 90 tablet, Rfl: 1 .  cephALEXin (KEFLEX) 500 MG capsule, Take 1 capsule (500 mg total) by mouth 2 (two) times daily for 7 days., Disp: 14 capsule, Rfl: 0 .  DEXILANT 60 MG capsule, Take 1 capsule (60 mg total) by mouth daily. Symptoms refractory to Prevacid and Omeprazole.  New PA needed for dexilant. May need to submit as brand, Disp: 90 capsule, Rfl: 3 .  fluconazole (DIFLUCAN) 150 MG tablet, Take 1 tablet (150 mg total) by mouth once for 1 dose., Disp: 1 tablet, Rfl: 0 .  hydrocortisone cream 1 %, Apply 1 application topically 2 (two) times daily., Disp: 30 g, Rfl: 0 .  ibandronate (BONIVA) 150 MG tablet, Take 1 tablet (150 mg total) by mouth every 30 (thirty) days. Take in the morning with a full glass of water, on an empty stomach, and do not take anything else by mouth or lie down for the next 30 min., Disp: 3 tablet, Rfl: 0 .  linaclotide (LINZESS) 145 MCG CAPS capsule, Take 1 capsule (145 mcg total) by mouth daily before breakfast., Disp: 90 capsule, Rfl: 3 .  magic mouthwash SOLN, Take 5 mLs by mouth 3 (three) times daily as needed for mouth pain., Disp: 480 mL, Rfl: 0 .  metoCLOPramide (REGLAN) 5 MG tablet, Take 1 tablet (5 mg total) by mouth 3 (three) times daily before meals., Disp: 90 tablet, Rfl: 3 .  mirtazapine (REMERON) 7.5 MG tablet, Take 1 tablet (7.5 mg total) by mouth at bedtime., Disp: 30 tablet, Rfl: 0 .  Probiotic Product (ALIGN) 4 MG CAPS, Take 1 capsule by mouth daily., Disp: , Rfl:  .  psyllium (METAMUCIL) 58.6 % packet, Take 1 packet by mouth daily., Disp: , Rfl:  .  Rectal Protectant-Emollient (CALMOL-4) 76-10 % SUPP, Place 1 suppository rectally as needed (for hemmorrhoids)., Disp: 30 suppository, Rfl: 1 .  Simethicone (PHAZYME MAXIMUM STRENGTH) 250 MG CAPS, Take 250 mg by mouth 3 (three) times daily.  (Patient taking differently: Take 250 mg by mouth 3 (three) times daily as needed (gas relief).), Disp: 14 capsule, Rfl:  .  valACYclovir (VALTREX) 1000 MG tablet, Take 1 tablet (1,000 mg total) by mouth at bedtime., Disp: 90 tablet, Rfl: 4 .  ZENPEP 3000-10000 units CPEP, TAKE 1  BY MOUTH THREE TIMES DAILY WITH MEALS, Disp: 90 capsule, Rfl: 6 Social History   Socioeconomic History  . Marital status: Married    Spouse name: Not on file  . Number of children: 0  . Years of education: Not on file  . Highest education level: Not on file  Occupational History  . Occupation: Mudlogger    Comment: Economist: [employer]  Tobacco Use  . Smoking status: Former Smoker    Packs/day: 0.50    Years: 6.00    Pack years: 3.00    Types: Cigarettes  . Smokeless tobacco: Never Used  . Tobacco comment: USE SMOKELESS CIGARETTES  Vaping Use  . Vaping Use: Never used  Substance and Sexual Activity  . Alcohol use: Yes    Alcohol/week: 0.0 standard drinks    Comment: 1 a week, wine  . Drug use: No  . Sexual activity: Not on file  Other Topics Concern  . Not on file  Social History Narrative  . Not on file   Social Determinants of Health   Financial Resource Strain: Not on file  Food Insecurity: Not on file  Transportation Needs: Not on file  Physical Activity: Not on file  Stress: Not on file  Social Connections: Not on file  Intimate Partner Violence: Not on file   Family History  Problem Relation Age of Onset  . Hypertension Mother   . Diabetes Father   . Heart attack Father        Age 96    Objective: Office vital signs reviewed. BP 120/75   Pulse (!) 106   Temp 98.5 F (36.9 C)   Ht 5' (1.524 m)   Wt 105 lb 9.6 oz (47.9 kg)   SpO2 99%   BMI 20.62 kg/m   Physical Examination:  General: Awake, alert, No acute distress HEENT: Normal; sclera white.  She has scant white substance noted along the hard palate on the right and  along the right inner cheek.  No oral lesions, erythema noted.  TMs intact bilaterally with mild clear effusions noted behind the TMs but no significant erythema or appreciable purulence Cardio: regular rate and rhythm, S1S2 heard, no murmurs appreciated Pulm: clear to auscultation bilaterally, no wheezes, rhonchi or rales; normal work of breathing on room air GI: soft, mild epigastric tenderness noted.  Non-distended, bowel sounds present x4, no hepatomegaly, no splenomegaly, no masses Rectal: No external hemorrhoids, bleeding or fissure appreciated.  Rectum normal in appearance GU: She does have a white vaginal discharge noted along the labia majora/minora.  Assessment/ Plan: 65 y.o. female   Acute cystitis without hematuria - Plan: Urinalysis, Complete, cephALEXin (KEFLEX) 500 MG capsule  Back pain, unspecified back location, unspecified back pain laterality, unspecified chronicity - Plan: Urinalysis, Complete  Pancreatic insufficiency - Plan: Lipase, Amylase, Pancreatic Elastase, Fecal  Thrush - Plan: fluconazole (DIFLUCAN) 150 MG tablet, magic mouthwash SOLN, DISCONTINUED: magic mouthwash SOLN, DISCONTINUED: magic mouthwash SOLN  Yeast vaginitis - Plan: fluconazole (DIFLUCAN) 150 MG tablet  Eustachian tube dysfunction, bilateral  Urinalysis consistent with acute cystitis.  Keflex twice daily for 1 week prescribed.  I have also given her Diflucan for what appears to be a yeast vaginitis.  Magic mouthwash solution sent.  She did have a few spots on the right side of her oropharynx and cheek that appear to be yeastlike in appearance.  We discussed adequate hydration and use of Biotene if needed for dry mouth.  She wanted me to recollect pancreatic enzymes and pancreatic elastase today.  She is reluctant to continue using the prescribed pancreatic enzymes from GI because it caused her some stomach upset.  I suspect that she has eustachian tube dysfunction.  We discussed consideration for  Claritin or similar.  Okay to continue Flonase  Additionally, no evidence of external or complicated external hemorrhoid on exam.  Continue home care instructions for routine care  Orders Placed This Encounter  Procedures  . Urinalysis, Complete  . Lipase  . Amylase  . Pancreatic Elastase, Fecal   Meds ordered this encounter  Medications  . fluconazole (DIFLUCAN) 150 MG tablet    Sig: Take 1 tablet (150 mg total) by mouth once for 1 dose.    Dispense:  1 tablet    Refill:  0  . magic mouthwash SOLN    Sig: Take 5 mLs by mouth 3 (three) times daily as needed for mouth pain.    Dispense:  480 mL    Refill:  0  . cephALEXin (KEFLEX) 500 MG capsule    Sig: Take 1 capsule (500 mg total) by mouth 2 (two) times daily for 7 days.    Dispense:  14 capsule    Refill:  [place], DO [place] [phone]

## 2020-05-17 LAB — AMYLASE: Amylase: 110 U/L (ref 31–110)

## 2020-05-17 LAB — LIPASE: Lipase: 56 U/L (ref 14–72)

## 2020-05-19 ENCOUNTER — Other Ambulatory Visit: Payer: Self-pay

## 2020-05-19 ENCOUNTER — Other Ambulatory Visit: Payer: 59

## 2020-05-19 DIAGNOSIS — K8689 Other specified diseases of pancreas: Secondary | ICD-10-CM

## 2020-05-20 ENCOUNTER — Ambulatory Visit: Payer: 59 | Admitting: Family Medicine

## 2020-05-23 LAB — PANCREATIC ELASTASE, FECAL: Pancreatic Elastase, Fecal: 124 ug Elast./g — ABNORMAL LOW (ref >200)

## 2020-05-28 ENCOUNTER — Telehealth: Payer: Self-pay

## 2020-05-28 ENCOUNTER — Telehealth: Payer: Self-pay | Admitting: Family Medicine

## 2020-05-28 NOTE — Telephone Encounter (Signed)
Patient has question regarding ZenPep medication. Advised to consult with gastro provider who prescribed the medication. Dr Lajuana Ripple out of office until 6/7. Patient agreeable.

## 2020-05-28 NOTE — Telephone Encounter (Signed)
Inbound call from patient. States she have more questions about her results and also questions about certain medications.

## 2020-05-28 NOTE — Telephone Encounter (Signed)
Pt would like to talk to Dr Darnell Level or her nurse to ask questions. She is aware that Dr Darnell Level is on vacation.

## 2020-05-28 NOTE — Telephone Encounter (Signed)
Patient calls to ask your thoughts on the results from her recent fecal pancreatic elastase that was done through her PCP.  Patient has been off the Zenpep for "a couple of weeks" when she had the lab done. She asks if you think she should go back on it? Dosage? Can she drink wine occasionally?

## 2020-05-29 ENCOUNTER — Other Ambulatory Visit: Payer: Self-pay

## 2020-05-29 MED ORDER — ZENPEP 25000-79000 UNITS PO CPEP: 2.0000 | ORAL_CAPSULE | Freq: Three times a day (TID) | ORAL | 3 refills | Status: DC

## 2020-05-29 NOTE — Telephone Encounter (Signed)
Patient notified. Rx for Zenpep may not be covered by her insurance. Epic indicated it was non-formulary. If it is not covered, would Creon be okay?

## 2020-05-29 NOTE — Telephone Encounter (Signed)
If she is asking from a pancreas standpoint  then yes - but recommendation for women is no more than one glass per day

## 2020-05-29 NOTE — Telephone Encounter (Signed)
Can she have wine?

## 2020-05-29 NOTE — Telephone Encounter (Signed)
I had not seen the results from her PCP from just reviewed - yes she should go back on Zenpep  Lets start with Zenpep 25,000 , take two with each meal

## 2020-05-29 NOTE — Telephone Encounter (Signed)
Alternative med would be Creon, would start with 12,000units/38/60 -take 2 with each meal, one if small meal or snack

## 2020-06-18 ENCOUNTER — Telehealth: Payer: Self-pay | Admitting: Physician Assistant

## 2020-06-18 NOTE — Telephone Encounter (Signed)
I do not understand how this happening to her. She is taking Zenpep, Linzess and Reglan or it is on her medication list. What are the options for her?

## 2020-06-18 NOTE — Telephone Encounter (Signed)
Inbound call from patient. States she is having problems with the increased dosage for Zenpep. Pain, constipation, does not think it is working. Wants to discuss Best contact number [phone]

## 2020-06-19 NOTE — Telephone Encounter (Signed)
No answer. Mailbox is full and cannot accept any messages.

## 2020-06-20 NOTE — Telephone Encounter (Signed)
Called the patient. Voicemail identifies as the patient. Mailbox is full and cannot accept messages.

## 2020-06-23 NOTE — Telephone Encounter (Signed)
Attempted to call patient. Call went to VM and message states VM is full, cannot accept messages.

## 2020-06-25 NOTE — Telephone Encounter (Signed)
Spoke with patient, pt states she continues with severe constipation, gas, and bloating which is causing remarkable pain. Pt states she feels so bloated she can not wear a bra. Patients states these symptoms are so bad they are causing her chest pains. Pt says her domperidone came and she has started that, never started Reglan. Pt says she has tried Miralax with no improvement. Also has tried Gas X which "absolutely" does not work. Pt says these symptoms are worse than IBS and gastroparesis. Patient currently taking Zenpep 2 capsules in the day and 2 capsules in night. Patient instructed per provider's note to cut current dose in half for a few weeks to see if this helps symptoms. Patient advised to call office in a few weeks to report if this medication change has helped. Patient has verbalized understanding and nothing further needed at this time.

## 2020-06-27 NOTE — Telephone Encounter (Signed)
Spoke with patient in regards to recommendations. She states that she does not tolerate the Miralax bowel purge. She states the last time she did one she was in the bed for about 2-3 days after because she was so weak and dehydrated. She states that she is taking care of her parents and recently had to put her husband in the nursing home so she can't be in the bed for days. She currently takes Align probiotic daily and 1 packet of Metamucil every night. Her last BM was yesterday, she states that she only went "a little bit". She states that she has a lot of bloating also. States that she has taken Gas X as directed, 3 times a day. She would like to know the following: - With the stronger dose of Zenpep that was sent in, can she do 2 capsules daily instead of 4? She does not want the pancreatic insufficiency to get out of line either. - Patient would like to know if we can order her a Cologuard test? She is concerned that something is going on and would like to proceed with Cologuard.   Please advise, thanks.

## 2020-06-27 NOTE — Telephone Encounter (Signed)
Spoke with patient in regards to recommendations. She wanted to know what alternatives she can take other than Miralax. Advised patient that she can try Dulcolax OTC and take as directed on the box or Magnesium citrate. Patient has been scheduled for a follow up with Nicoletta Ba, PA-C on Tuesday, 07/29/20 at 2:30 pm. Patient verbalized understanding and had no concerns at the end of the call.

## 2020-07-08 ENCOUNTER — Ambulatory Visit: Payer: 59 | Admitting: Physician Assistant

## 2020-07-08 ENCOUNTER — Encounter: Payer: Self-pay | Admitting: Physician Assistant

## 2020-07-08 ENCOUNTER — Other Ambulatory Visit: Payer: Self-pay

## 2020-07-08 VITALS — BP 110/70 | HR 107 | Temp 97.8°F | Ht 60.0 in | Wt 106.0 lb

## 2020-07-08 DIAGNOSIS — R1010 Upper abdominal pain, unspecified: Secondary | ICD-10-CM | POA: Diagnosis not present

## 2020-07-08 NOTE — Progress Notes (Signed)
Subjective:     Patient ID: Ruth Terry, female   DOB: 12/30/1955, 65 y.o.   MRN: [id number]  Abdominal Pain Associated symptoms include constipation and diarrhea. Pertinent negatives include no nausea or vomiting.  Pt with sig hx of IBS and reflux States over the last several weeks sx have been worse with bloating, gas, and intermit constipation/diarrhea She went to urgent Care over the weekend and they started her on Carafate and informed for her to follow up with her regular MD She called her GI specialist but told it would be 6 weeks for appt Has some concerns regarding C diff and H pylori which she has had in the past  Review of Systems  Constitutional:  Positive for activity change and fatigue.  HENT: Negative.    Respiratory: Negative.    Cardiovascular: Negative.   Gastrointestinal:  Positive for abdominal pain, constipation and diarrhea. Negative for anal bleeding, blood in stool, nausea, rectal pain and vomiting.  Genitourinary: Negative.       Objective:   Physical Exam Vitals and nursing note reviewed.  Constitutional:      General: She is not in acute distress.    Appearance: She is well-developed. She is not ill-appearing or toxic-appearing.  Cardiovascular:     Rate and Rhythm: Normal rate.     Heart sounds: Normal heart sounds.  Pulmonary:     Effort: Pulmonary effort is normal.     Breath sounds: Normal breath sounds.  Abdominal:     General: Abdomen is flat. There is no distension.     Palpations: Abdomen is soft. There is no hepatomegaly, splenomegaly, mass or pulsatile mass.     Tenderness: There is abdominal tenderness in the right upper quadrant and left upper quadrant. There is no right CVA tenderness, left CVA tenderness, guarding or rebound.  Neurological:     Mental Status: She is alert.       Assessment:     1. Pain of upper abdomen        Plan:     Parke Simmers diet Continue current med's CBC, CMP,H Pylori, Stool cult ordered today Will  inform of results Discussed calling specialist office to see if she could get on the cancellation list F/U pending labs

## 2020-07-08 NOTE — Patient Instructions (Signed)
Irritable Bowel Syndrome, Adult  Irritable bowel syndrome (IBS) is a group of symptoms that affects the organs responsible for digestion (gastrointestinal or GI tract). IBS is not one specific disease. To regulate how the GI tract works, the body sends signals back and forth between the intestines and the brain. If you have IBS, there may be a problem with these signals. As a result, the GI tract does not function normally. The intestines may become more sensitive and overreact to certain things. This maybe especially true when you eat certain foods or when you are under stress. There are four types of IBS. These may be determined based on the consistency of your stool (feces): IBS with diarrhea. IBS with constipation. Mixed IBS. Unsubtyped IBS. It is important to know which type of IBS you have. Certain treatments are morelikely to be helpful for certain types of IBS. What are the causes? The exact cause of IBS is not known. What increases the risk? You may have a higher risk for IBS if you: Are female. Are younger than 3. Have a family history of IBS. Have a mental health condition, such as depression, anxiety, or post-traumatic stress disorder. Have had a bacterial infection of your GI tract. What are the signs or symptoms? Symptoms of IBS vary from person to person. The main symptom is abdominal pain or discomfort. Other symptoms usually include one or more of the following: Diarrhea, constipation, or both. Abdominal swelling or bloating. Feeling full after eating a small or regular-sized meal. Frequent gas. Mucus in the stool. A feeling of having more stool left after a bowel movement. Symptoms tend to come and go. They may be triggered by stress, mental healthconditions, or certain foods. How is this diagnosed? This condition may be diagnosed based on a physical exam, your medical history, and your symptoms. You may have tests, such as: Blood tests. Stool test. X-rays. CT  scan. Colonoscopy. This is a procedure in which your GI tract is viewed with a long, thin, flexible tube. How is this treated? There is no cure for IBS, but treatment can help relieve symptoms. Treatment depends on the type of IBS you have, and may include: Changes to your diet, such as: Avoiding foods that cause symptoms. Drinking more water. Following a low-FODMAP (fermentable oligosaccharides, disaccharides, monosaccharides, and polyols) diet for up to 6 weeks, or as told by your health care provider. FODMAPs are sugars that are hard for some people to digest. Eating more fiber. Eating medium-sized meals at the same times every day. Medicines. These may include: Fiber supplements, if you have constipation. Medicine to control diarrhea (antidiarrheal medicines). Medicine to help control muscle tightening (spasms) in your GI tract (antispasmodic medicines). Medicines to help with mental health conditions, such as antidepressants or tranquilizers. Talk therapy or counseling. Working with a diet and nutrition specialist (dietitian) to help create a food plan that is right for you. Managing your stress. Follow these instructions at home: Eating and drinking Eat a healthy diet. Eat medium-sized meals at about the same time every day. Do not eat large meals. Gradually eat more fiber-rich foods. These include whole grains, fruits, and vegetables. This may be especially helpful if you have IBS with constipation. Eat a diet low in FODMAPs. Drink enough fluid to keep your urine pale yellow. Keep a journal of foods that seem to trigger symptoms. Avoid foods and drinks that: Contain added sugar. Make your symptoms worse. Dairy products, caffeinated drinks, and carbonated drinks can make symptoms worse for some  people. General instructions Take over-the-counter and prescription medicines and supplements only as told by your health care provider. Get enough exercise. Do at least 150 minutes of  moderate-intensity exercise each week. Manage your stress. Getting enough sleep and exercise can help you manage stress. Keep all follow-up visits as told by your health care provider and therapist. This is important. Alcohol Use Do not drink alcohol if: Your health care provider tells you not to drink. You are pregnant, may be pregnant, or are planning to become pregnant. If you drink alcohol, limit how much you have: 0-1 drink a day for women. 0-2 drinks a day for men. Be aware of how much alcohol is in your drink. In the [place] one drink equals one typical bottle of beer (12 oz), one-half glass of wine (5 oz), or one shot of hard liquor (1 oz). Contact a health care provider if you have: Constant pain. Weight loss. Difficulty or pain when swallowing. Diarrhea that gets worse. Get help right away if you have: Severe abdominal pain. Fever. Diarrhea with symptoms of dehydration, such as dizziness or dry mouth. Bright red blood in your stool. Stool that is black and tarry. Abdominal swelling. Vomiting that does not stop. Blood in your vomit. Summary Irritable bowel syndrome (IBS) is not one specific disease. It is a group of symptoms that affects digestion. Your intestines may become more sensitive and overreact to certain things. This may be especially true when you eat certain foods or when you are under stress. There is no cure for IBS, but treatment can help relieve symptoms. This information is not intended to replace advice given to you by your health care provider. Make sure you discuss any questions you have with your healthcare provider. Document Revised: 08/23/2019 Document Reviewed: 08/23/2019 Elsevier Patient Education  2022 [employer].

## 2020-07-09 ENCOUNTER — Other Ambulatory Visit: Payer: 59

## 2020-07-09 LAB — CBC WITH DIFFERENTIAL/PLATELET
Basophils Absolute: 0.1 10*3/uL (ref 0.0–0.2)
Basos: 1 %
EOS (ABSOLUTE): 0.1 10*3/uL (ref 0.0–0.4)
Eos: 2 %
Hematocrit: 39.1 % (ref 34.0–46.6)
Hemoglobin: 13.6 g/dL (ref 11.1–15.9)
Immature Grans (Abs): 0 10*3/uL (ref 0.0–0.1)
Immature Granulocytes: 0 %
Lymphocytes Absolute: 1.8 10*3/uL (ref 0.7–3.1)
Lymphs: 34 %
MCH: 30 pg (ref 26.6–33.0)
MCHC: 34.8 g/dL (ref 31.5–35.7)
MCV: 86 fL (ref 79–97)
Monocytes Absolute: 0.4 10*3/uL (ref 0.1–0.9)
Monocytes: 7 %
Neutrophils Absolute: 3 10*3/uL (ref 1.4–7.0)
Neutrophils: 56 %
Platelets: 239 10*3/uL (ref 150–450)
RBC: 4.54 x10E6/uL (ref 3.77–5.28)
RDW: 12.7 % (ref 11.7–15.4)
WBC: 5.4 10*3/uL (ref 3.4–10.8)

## 2020-07-09 LAB — CMP14+EGFR
ALT: 7 IU/L (ref 0–32)
AST: 10 IU/L (ref 0–40)
Albumin/Globulin Ratio: 2.9 — ABNORMAL HIGH (ref 1.2–2.2)
Albumin: 4.6 g/dL (ref 3.8–4.8)
Alkaline Phosphatase: 86 IU/L (ref 44–121)
BUN/Creatinine Ratio: 8 — ABNORMAL LOW (ref 12–28)
BUN: 7 mg/dL — ABNORMAL LOW (ref 8–27)
Bilirubin Total: <0.2 mg/dL (ref 0.0–1.2)
CO2: 25 mmol/L (ref 20–29)
Calcium: 9.3 mg/dL (ref 8.7–10.3)
Chloride: 100 mmol/L (ref 96–106)
Creatinine, Ser: 0.83 mg/dL (ref 0.57–1.00)
Globulin, Total: 1.6 g/dL (ref 1.5–4.5)
Glucose: 104 mg/dL — ABNORMAL HIGH (ref 65–99)
Potassium: 3.8 mmol/L (ref 3.5–5.2)
Sodium: 141 mmol/L (ref 134–144)
Total Protein: 6.2 g/dL (ref 6.0–8.5)
eGFR: 79 mL/min/{1.73_m2} (ref >59)

## 2020-07-13 LAB — STOOL CULTURE: E coli, Shiga toxin Assay: NEGATIVE

## 2020-07-13 LAB — H. PYLORI ANTIGEN, STOOL: H pylori Ag, Stl: NEGATIVE

## 2020-07-14 ENCOUNTER — Other Ambulatory Visit: Payer: Self-pay

## 2020-07-14 ENCOUNTER — Encounter: Payer: Self-pay | Admitting: Family Medicine

## 2020-07-14 ENCOUNTER — Ambulatory Visit: Payer: 59 | Admitting: Family Medicine

## 2020-07-14 VITALS — BP 126/81 | HR 105 | Temp 97.6°F | Ht 60.0 in | Wt 105.4 lb

## 2020-07-14 DIAGNOSIS — J329 Chronic sinusitis, unspecified: Secondary | ICD-10-CM

## 2020-07-14 DIAGNOSIS — M5126 Other intervertebral disc displacement, lumbar region: Secondary | ICD-10-CM | POA: Diagnosis not present

## 2020-07-14 DIAGNOSIS — K8689 Other specified diseases of pancreas: Secondary | ICD-10-CM | POA: Diagnosis not present

## 2020-07-14 DIAGNOSIS — M5136 Other intervertebral disc degeneration, lumbar region: Secondary | ICD-10-CM

## 2020-07-14 DIAGNOSIS — J310 Chronic rhinitis: Secondary | ICD-10-CM

## 2020-07-14 DIAGNOSIS — K582 Mixed irritable bowel syndrome: Secondary | ICD-10-CM | POA: Diagnosis not present

## 2020-07-14 MED ORDER — TRIAMCINOLONE ACETONIDE 55 MCG/ACT NA AERO: 2.0000 | INHALATION_SPRAY | Freq: Every day | NASAL | 12 refills | Status: DC

## 2020-07-14 MED ORDER — RIFAXIMIN 550 MG PO TABS: 550.0000 mg | ORAL_TABLET | Freq: Three times a day (TID) | ORAL | 0 refills | Status: AC

## 2020-07-14 NOTE — Progress Notes (Signed)
Subjective: CC: Abdominal pain PCP: [name], DO Ruth Terry is a 65 y.o. female presenting to clinic today for:  1.  Chronic abdominal pain Patient with ongoing abdominal pain, bloating.  She reports that she feels constipated and takes Linzess but has diarrhea fairly regularly.  She was seen at urgent care and then here in office last week.  She was placed on Carafate but has not found any improvement in symptoms.  She is currently taking Zenpep high-dose in the a.m. but lower dose in the p.m. as the high-dose twice daily did not agree with her.  She is supposed be seeing her GI doctor on the 29th.  She is worried about bacterial overgrowth and wants to try going back on Xifaxan again.  2.  Ear fullness Patient with ongoing ear fullness.  She reports facial pain over the maxillary sinuses.  She notes these are refractory to her Flonase which she has been using fairly consistently since she had COVID 5 months ago.  She denies any fevers, cough, purulent drainage, headaches.  3.  DDD Patient reports weakness in her legs and has ongoing pain.  She was seen by Dr. Lorin Mercy and instructed to start physical therapy but was lost to follow-up after she had COVID-19 5 months ago.  She has not yet contacted him for follow-up.   ROS: Per HPI  Allergies  Allergen Reactions   Chlordiazepoxide-Clidinium Itching   Ciprofloxacin Itching    Irritates stomach   Flagyl [Metronidazole]     Severe yeast infection   Sulfa Drugs Cross Reactors Nausea Only   Cefdinir Nausea And Vomiting   Nitrofurantoin Monohyd Macro Rash   Past Medical History:  Diagnosis Date   Anal fissure    Chronic constipation    Diverticulosis 04-20-10   colonoscopy   Gastroparesis    GERD (gastroesophageal reflux disease)    Hemorrhoids    Hyperlipidemia    IBS (irritable bowel syndrome)    MVP (mitral valve prolapse)    Polyp, stomach 04-20-10   egd   Shingles    UTI (lower urinary tract infection)      Current Outpatient Medications:    AMBULATORY NON FORMULARY MEDICATION, Medication Name: Domperidone 10 mg Take 1 tablet at bedtime daily., Disp: 90 tablet, Rfl: 1   DEXILANT 60 MG capsule, Take 1 capsule (60 mg total) by mouth daily. Symptoms refractory to Prevacid and Omeprazole.  New PA needed for dexilant. May need to submit as brand, Disp: 90 capsule, Rfl: 3   hydrocortisone cream 1 %, Apply 1 application topically 2 (two) times daily., Disp: 30 g, Rfl: 0   ibandronate (BONIVA) 150 MG tablet, Take 1 tablet (150 mg total) by mouth every 30 (thirty) days. Take in the morning with a full glass of water, on an empty stomach, and do not take anything else by mouth or lie down for the next 30 min., Disp: 3 tablet, Rfl: 0   linaclotide (LINZESS) 145 MCG CAPS capsule, Take 1 capsule (145 mcg total) by mouth daily before breakfast., Disp: 90 capsule, Rfl: 3   magic mouthwash SOLN, Take 5 mLs by mouth 3 (three) times daily as needed for mouth pain., Disp: 480 mL, Rfl: 0   metoCLOPramide (REGLAN) 5 MG tablet, Take 1 tablet (5 mg total) by mouth 3 (three) times daily before meals., Disp: 90 tablet, Rfl: 3   mirtazapine (REMERON) 7.5 MG tablet, Take 1 tablet (7.5 mg total) by mouth at bedtime., Disp: 30 tablet, Rfl: 0  Probiotic Product (ALIGN) 4 MG CAPS, Take 1 capsule by mouth daily., Disp: , Rfl:    psyllium (METAMUCIL) 58.6 % packet, Take 1 packet by mouth daily., Disp: , Rfl:    Rectal Protectant-Emollient (CALMOL-4) 76-10 % SUPP, Place 1 suppository rectally as needed (for hemmorrhoids)., Disp: 30 suppository, Rfl: 1   Simethicone (PHAZYME MAXIMUM STRENGTH) 250 MG CAPS, Take 250 mg by mouth 3 (three) times daily. (Patient taking differently: Take 250 mg by mouth 3 (three) times daily as needed (gas relief).), Disp: 14 capsule, Rfl:    valACYclovir (VALTREX) 1000 MG tablet, Take 1 tablet (1,000 mg total) by mouth at bedtime., Disp: 90 tablet, Rfl: 4   Pancrelipase, Lip-Prot-Amyl, (ZENPEP)  25000-79000 units CPEP, Take 2 capsules by mouth 3 (three) times daily with meals., Disp: 180 capsule, Rfl: 3 Social History   Socioeconomic History   Marital status: Married    Spouse name: Not on file   Number of children: 0   Years of education: Not on file   Highest education level: Not on file  Occupational History   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    Employer: [employer]  Tobacco Use   Smoking status: Former    Packs/day: 0.50    Years: 6.00    Pack years: 3.00    Types: Cigarettes   Smokeless tobacco: Never   Tobacco comments:    USE SMOKELESS CIGARETTES  Vaping Use   Vaping Use: Never used  Substance and Sexual Activity   Alcohol use: Yes    Alcohol/week: 0.0 standard drinks    Comment: 1 a week, wine   Drug use: No   Sexual activity: Not on file  Other Topics Concern   Not on file  Social History Narrative   Not on file   Social Determinants of Health   Financial Resource Strain: Not on file  Food Insecurity: Not on file  Transportation Needs: Not on file  Physical Activity: Not on file  Stress: Not on file  Social Connections: Not on file  Intimate Partner Violence: Not on file   Family History  Problem Relation Age of Onset   Hypertension Mother    Diabetes Father    Heart attack Father        Age 21    Objective: Office vital signs reviewed. BP 126/81   Pulse (!) 105   Temp 97.6 F (36.4 C)   Ht 5' (1.524 m)   Wt 105 lb 6.4 oz (47.8 kg)   SpO2 99%   BMI 20.58 kg/m   Physical Examination:  General: Awake, alert, No acute distress HEENT: Sclera white.  TMs intact bilaterally with normal light reflex.  No bulging.  No erythema or purulence behind the TM.  Nasal turbinates are moist.  Clear rhinorrhea without evidence of bacterial infection Extremities: warm, well perfused, No edema, cyanosis or clubbing; +2 pulses bilaterally MSK: Independent gait.  5 out of 5 lower extremity strength Neuro:  Light touch sensation grossly intact bilateral lower extremities  Assessment/ Plan: 65 y.o. female   Irritable bowel syndrome with both constipation and diarrhea - Plan: rifaximin (XIFAXAN) 550 MG TABS tablet  Pancreatic insufficiency  Rhinosinusitis - Plan: triamcinolone (NASACORT) 55 MCG/ACT AERO nasal inhaler  Bulging lumbar disc  Continues to suffer from diarrhea, abdominal bloating despite use of domperidone on and Zenpep.  We will treat with Xifaxan x2 weeks.  I advised her not to start any medications until she is contacted tomorrow  as I would like to run her case by our clinical pharmacist.  We will also CC her GI doctor to inform her of this treatment plan.  I reviewed her stool culture which was negative.  H. pylori negative  For now, I advised her to continue all medications as outlined by her specialist.  I have switched her Flonase to Nasacort in efforts to improve sinusitis.  There is no evidence of bacterial sinusitis nor any abnormalities noted within the TMs today.  Her strength was 5 out of 5 bilaterally.  No focal neurologic deficits appreciated  No orders of the defined types were placed in this encounter.  No orders of the defined types were placed in this encounter.    [name], DO [place] [phone]

## 2020-07-16 ENCOUNTER — Telehealth: Payer: Self-pay | Admitting: Family Medicine

## 2020-07-16 NOTE — Telephone Encounter (Signed)
Pt called to get update on if it was ok for her to take antibiotic while taking other medicines..  Please call patient. [phone]

## 2020-07-17 ENCOUNTER — Telehealth: Payer: Self-pay

## 2020-07-17 NOTE — Telephone Encounter (Signed)
Ruth Terry (Ruth Terry) Rx #: [id number] Xifaxan 550MG  tablets

## 2020-07-17 NOTE — Telephone Encounter (Signed)
This request has been approved.  Please note any additional information provided by [employer] at the bottom of your screen.

## 2020-07-21 ENCOUNTER — Other Ambulatory Visit: Payer: Self-pay | Admitting: Nurse Practitioner

## 2020-07-21 DIAGNOSIS — Z8619 Personal history of other infectious and parasitic diseases: Secondary | ICD-10-CM

## 2020-07-24 ENCOUNTER — Telehealth: Payer: Self-pay | Admitting: Family Medicine

## 2020-07-24 NOTE — Telephone Encounter (Signed)
The treatment protocol is 2 weeks.  Please refer her to their website if she has questions regarding the clinical studies: [email or web address]

## 2020-07-29 ENCOUNTER — Ambulatory Visit: Payer: 59 | Admitting: Physician Assistant

## 2020-07-31 ENCOUNTER — Encounter: Payer: Self-pay | Admitting: Physician Assistant

## 2020-07-31 ENCOUNTER — Ambulatory Visit (INDEPENDENT_AMBULATORY_CARE_PROVIDER_SITE_OTHER): Payer: 59 | Admitting: Physician Assistant

## 2020-07-31 VITALS — BP 108/58 | HR 94 | Ht 60.0 in | Wt 106.6 lb

## 2020-07-31 DIAGNOSIS — K5900 Constipation, unspecified: Secondary | ICD-10-CM | POA: Diagnosis not present

## 2020-07-31 DIAGNOSIS — K219 Gastro-esophageal reflux disease without esophagitis: Secondary | ICD-10-CM | POA: Diagnosis not present

## 2020-07-31 DIAGNOSIS — R109 Unspecified abdominal pain: Secondary | ICD-10-CM

## 2020-07-31 DIAGNOSIS — K6389 Other specified diseases of intestine: Secondary | ICD-10-CM

## 2020-07-31 DIAGNOSIS — K582 Mixed irritable bowel syndrome: Secondary | ICD-10-CM

## 2020-07-31 DIAGNOSIS — G8929 Other chronic pain: Secondary | ICD-10-CM

## 2020-07-31 DIAGNOSIS — R140 Abdominal distension (gaseous): Secondary | ICD-10-CM

## 2020-07-31 MED ORDER — HYDROCORTISONE ACETATE 25 MG RE SUPP: RECTAL | 3 refills | Status: DC

## 2020-07-31 MED ORDER — METOCLOPRAMIDE HCL 5 MG PO TABS: 5.0000 mg | ORAL_TABLET | Freq: Three times a day (TID) | ORAL | 6 refills | Status: DC

## 2020-07-31 MED ORDER — ZENPEP 25000-79000 UNITS PO CPEP: 2.0000 | ORAL_CAPSULE | Freq: Three times a day (TID) | ORAL | 3 refills | Status: DC

## 2020-07-31 NOTE — Progress Notes (Signed)
Subjective:    Patient ID: Ruth Terry, female    DOB: [birth year], 65 y.o.   MRN: [id number]  HPI [name] is a 65 year old white female, established with Dr. Marina Goodell and also known to myself.  She was last seen in March 2022.  She comes back in today for follow-up and with complaints of abdominal bloating, constipation and abdominal pain. She has history of chronic constipation for which she has been maintained on Linzess 145 mcg daily, chronic GERD for which she is on Dexilant 60 mg daily, gastroparesis for which she has been on domperidone which will no longer be available to her and is asking to switch to metoclopramide.  Also with previously documented diverticulosis.  She has had chronic dyspeptic complaints. Last colonoscopy was done in 2012 per Dr. Jarold Motto. She had recently been diagnosed with pancreatic insufficiency after a fecal elastase returned abnormal per her PCP, and we were asked to address.  She has been started on Zenpep, and currently is taking 25,000/79,001 2 with each meal. Patient says she is usually just taking 2 doses of the Zenpep daily because she does not generally eat breakfast.  She does feel that perhaps she has less abdominal discomfort since starting the Zenpep, but this is not clear.  She had been feeling a lot more bloated and uncomfortable recently had had seen her PCP.  She related history of SIBO and asked if she could take a course of Xifaxan which she is now on and will complete next week.  She says prior to the Xifaxan she was having a lot of issues with diarrhea again as well and says that now her stools have gone back to being more constipated.  She says her gut usually feels healthier after she has taken a course of Xifaxan.  She is having bowel movements at least once daily if not twice.  She has a sense continually of incomplete evacuation and some intermittent rectal pressure.  She also gets intermittent upper abdominal discomfort. She has been under a  lot of continual stress, she relates that her husband was hospitalized and recently in a coma she has just brought him home within the past week or so and is also helping to care for her to elderly parents. At the time of last office visit she was to be scheduled for EGD and colonoscopy but did not follow through with scheduling.  She says she cannot do those now because of her family issues and caring for her husband but would like to do them early this fall. We did do CT of the abdomen pelvis in April 2022 which was negative.  Review of Systems Pertinent positive and negative review of systems were noted in the above HPI section.  All other review of systems was otherwise negative.   Outpatient Encounter Medications as of 07/31/2020  Medication Sig   AMBULATORY NON FORMULARY MEDICATION Medication Name: Domperidone 10 mg Take 1 tablet at bedtime daily.   DEXILANT 60 MG capsule Take 1 capsule (60 mg total) by mouth daily. Symptoms refractory to Prevacid and Omeprazole.  New PA needed for dexilant. May need to submit as brand   hydrocortisone (ANUSOL-HC) 25 MG suppository Use 1 suppository rectally at bedtime for 7 days then as needed   hydrocortisone cream 1 % Apply 1 application topically 2 (two) times daily.   ibandronate (BONIVA) 150 MG tablet Take 1 tablet (150 mg total) by mouth every 30 (thirty) days. Take in the morning with a full glass  of water, on an empty stomach, and do not take anything else by mouth or lie down for the next 30 min.   linaclotide (LINZESS) 145 MCG CAPS capsule Take 1 capsule (145 mcg total) by mouth daily before breakfast.   Probiotic Product (ALIGN) 4 MG CAPS Take 1 capsule by mouth daily.   psyllium (METAMUCIL) 58.6 % packet Take 1 packet by mouth daily.   Rectal Protectant-Emollient (CALMOL-4) 76-10 % SUPP Place 1 suppository rectally as needed (for hemmorrhoids).   rifaximin (XIFAXAN) 550 MG TABS tablet Take 550 mg by mouth 3 (three) times daily.   Simethicone  (PHAZYME MAXIMUM STRENGTH) 250 MG CAPS Take 250 mg by mouth 3 (three) times daily. (Patient taking differently: Take 250 mg by mouth 3 (three) times daily as needed (gas relief).)   triamcinolone (NASACORT) 55 MCG/ACT AERO nasal inhaler Place 2 sprays into the nose daily.   valACYclovir (VALTREX) 1000 MG tablet TAKE 1 TABLET (1,000 MG TOTAL) BY MOUTH AT BEDTIME.   [DISCONTINUED] metoCLOPramide (REGLAN) 5 MG tablet Take 1 tablet (5 mg total) by mouth 3 (three) times daily before meals.   [DISCONTINUED] Pancrelipase, Lip-Prot-Amyl, (ZENPEP) 25000-79000 units CPEP Take 2 capsules by mouth 3 (three) times daily with meals.   metoCLOPramide (REGLAN) 5 MG tablet Take 1 tablet (5 mg total) by mouth 3 (three) times daily before meals.   Pancrelipase, Lip-Prot-Amyl, (ZENPEP) 25000-79000 units CPEP Take 2 capsules by mouth 3 (three) times daily with meals.   [DISCONTINUED] AMBULATORY NON FORMULARY MEDICATION Medication Name: Domperidone 10 mg Take 1 tablet at bedtime daily. (Patient taking differently: No sig reported)   [DISCONTINUED] magic mouthwash SOLN Take 5 mLs by mouth 3 (three) times daily as needed for mouth pain.   [DISCONTINUED] mirtazapine (REMERON) 7.5 MG tablet Take 1 tablet (7.5 mg total) by mouth at bedtime.   No facility-administered encounter medications on file as of 07/31/2020.   Allergies  Allergen Reactions   Chlordiazepoxide-Clidinium Itching   Ciprofloxacin Itching    Irritates stomach   Flagyl [Metronidazole]     Severe yeast infection   Sulfa Drugs Cross Reactors Nausea Only   Cefdinir Nausea And Vomiting   Nitrofurantoin Monohyd Macro Rash   Patient Active Problem List   Diagnosis Date Noted   History of shingles 04/24/2019   Chronic midline low back pain without sciatica 02/27/2019   Chronic idiopathic constipation 04/26/2018   Aortic atherosclerosis (HCC) 03/01/2017   Rib fracture 12/07/2016   Gas pain 03/09/2016   Anal itching 03/09/2016   Elevated LDL cholesterol  level 10/24/2015   Internal hemorrhoids 05/14/2014   GERD (gastroesophageal reflux disease) 09/17/2011   IBS (irritable bowel syndrome) 08/25/2010   Gastroparesis 06/30/2010   Non-ulcer dyspepsia 04/03/2010   Social History   Socioeconomic History   Marital status: Married    Spouse name: Not on file   Number of children: 0   Years of education: Not on file   Highest education level: Not on file  Occupational History   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    Employer: [employer]  Tobacco Use   Smoking status: Former    Packs/day: 0.50    Years: 6.00    Pack years: 3.00    Types: Cigarettes   Smokeless tobacco: Never   Tobacco comments:    USE SMOKELESS CIGARETTES  Vaping Use   Vaping Use: Never used  Substance and Sexual Activity   Alcohol use: Yes    Alcohol/week: 0.0 standard drinks  Comment: 1 a week, wine   Drug use: No   Sexual activity: Not on file  Other Topics Concern   Not on file  Social History Narrative   Not on file   Social Determinants of Health   Financial Resource Strain: Not on file  Food Insecurity: Not on file  Transportation Needs: Not on file  Physical Activity: Not on file  Stress: Not on file  Social Connections: Not on file  Intimate Partner Violence: Not on file    Ms. Trettel's family history includes Diabetes in her father; Heart attack in her father; Hypertension in her mother.      Objective:    Vitals:   07/31/20 1430  BP: (!) 108/58  Pulse: 94    Physical Exam Well-developed well-nourished thin older white female in no acute distress.  Weight, 106, BMI 20.8  HEENT; nontraumatic normocephalic, EOMI, PE R LA, sclera anicteric. Oropharynx; not examined today Neck; supple, no JVD Cardiovascular; regular rate and rhythm with S1-S2, no murmur rub or gallop Pulmonary; Clear bilaterally Abdomen; soft, there is some mild generalized tenderness, nondistended, no palpable mass or  hepatosplenomegaly, bowel sounds are active Rectal; no external hemorrhoids noted, stool heme-negative small amount thrombosed internal hemorrhoids palpable Skin; benign exam, no jaundice rash or appreciable lesions Extremities; no clubbing cyanosis or edema skin warm and dry Neuro/Psych; alert and oriented x4, grossly nonfocal mood and affect appropriate        Assessment & Plan:   #32 65 year old with IBS-C, gastroparesis, chronic abdominal pain, GERD, abdominal bloating, and prior diagnosis of SIBO.  Patient is currently completing an empiric course of Xifaxan for SIBO prescribed per PCP, and has noted some improvement in symptoms.  Regarding chronic constipation it sounds as if the Linzess is working well, she does have sense of incomplete bowel evacuation which I think is manifestation of her IBS but is having bowel movements on a daily basis. I think Zoha has a lot of chronic significant stress contributing to her ongoing GI issues.  #2 pancreatic insufficiency-recent diagnosis with abnormal fecal elastase-she is tolerating current Zenpep dosage.  I am still not certain that she has had any improvement in symptoms on Zenpep #3 symptomatic hemorrhoids/internal #4 colon cancer screening-due for follow-up colonoscopy last done 2012  Plan; complete current course of Xifaxan Continue Linzess 145 mcg p.o. daily. She will complete her current prescription of domperidone and then start Reglan 5 mg 1/2-hour AC Continue Dexilant 60 mg p.o. daily. Will send prescription for Anusol HC suppositories, advised her to use for 5-7 nights in a row, for symptoms, then complete course as needed. Continue Zenpep 25,000/79,002 with each meal Patient is encouraged to proceed with EGD and colonoscopy for ongoing multiple GI complaints.  She does not wish to schedule currently but is agreeable to proceed with endoscopic evaluation in September or October and will call back in a month to schedule with Dr.  Marina Goodell.   Saifullah Jolley S Dewaun Kinzler PA-C 07/31/2020   Cc: Raliegh Ip, DO

## 2020-07-31 NOTE — Patient Instructions (Addendum)
If you are age 63 or younger, your body mass index should be between 19-25. Your Body mass index is 20.82 kg/m. If this is out of the aformentioned range listed, please consider follow up with your Primary Care Provider.  __________________________________________________________  The [facility] providers would like to encourage you to use [facility] to communicate with providers for non-urgent requests or questions.  Due to long hold times on the telephone, sending your provider a message by [facility] may be a faster and more efficient way to get a response.  Please allow 48 business hours for a response.  Please remember that this is for non-urgent requests.   START Reglan 5 mg 1 tablet 30 minutes prior to meals Hydrocortisone suppositories 1 suppository at bedtime for 7 nights Continue Dexilant, L:inzess and Zenpep.  Call the office in September and ask to speak with Beth in regards to scheduling a Colonoscopy and Endoscopy.  Thank you for entrusting me with your care and choosing [facility].  [name] Esterwood, PA-C

## 2020-07-31 NOTE — Progress Notes (Signed)
Assessment and plan noted. Difficult case due to significant functional overlay.

## 2020-08-07 ENCOUNTER — Other Ambulatory Visit: Payer: Self-pay

## 2020-08-07 ENCOUNTER — Encounter: Payer: Self-pay | Admitting: Orthopaedic Surgery

## 2020-08-07 ENCOUNTER — Ambulatory Visit (INDEPENDENT_AMBULATORY_CARE_PROVIDER_SITE_OTHER): Payer: 59 | Admitting: Orthopaedic Surgery

## 2020-08-07 VITALS — Ht 60.0 in | Wt 110.0 lb

## 2020-08-07 DIAGNOSIS — M545 Low back pain, unspecified: Secondary | ICD-10-CM

## 2020-08-07 DIAGNOSIS — G8929 Other chronic pain: Secondary | ICD-10-CM

## 2020-08-07 NOTE — Progress Notes (Signed)
Office Visit Note   Patient: Ruth Terry           Date of Birth: [birth year]           MRN: [id number] Visit Date: 08/07/2020              Requested by: Raliegh Ip, DO [address] [place],  [place] [address] PCP: Raliegh Ip, DO   Assessment & Plan: Visit Diagnoses:  1. Chronic bilateral low back pain, unspecified whether sciatica present   2. Chronic midline low back pain without sciatica     Plan: We will set patient up for some physical therapy in [place] but.  They can work on core strengthening lower extremity strengthening fall prevention.  Follow-Up Instructions: Return if symptoms worsen or fail to improve.   Orders:  Orders Placed This Encounter  Procedures   Ambulatory referral to Physical Therapy   No orders of the defined types were placed in this encounter.     Procedures: No procedures performed   Clinical Data: No additional findings.   Subjective: Chief Complaint  Patient presents with   Lower Back - Pain, Follow-up    HPI 65 year old female returns.  She had an annular bulge L5-S1 small.  She states she got COVID and her husband got COVID he fell hit his head had a head bleed was then skilled facility for extended length of time.  At times she has time to help him with mobility pulling.  She now noticed some weakness in her legs and intermittent back pain some days better than others.  No associated bowel or bladder symptoms but she does have some IBS.  Patient's been on Boniva for 4months for osteoporosis.  Review of Systems all other systems noncontributory to HPI.  Half pack per day smoker she quit several years ago.   Objective: Vital Signs: Ht 5' (1.524 m)   Wt 110 lb (49.9 kg)   BMI 21.48 kg/m   Physical Exam Constitutional:      Appearance: She is well-developed.  HENT:     Head: Normocephalic.     Right Ear: External ear normal.     Left Ear: External ear normal. There is no impacted cerumen.  Eyes:      Pupils: Pupils are equal, round, and reactive to light.  Neck:     Thyroid: No thyromegaly.     Trachea: No tracheal deviation.  Cardiovascular:     Rate and Rhythm: Normal rate.  Pulmonary:     Effort: Pulmonary effort is normal.  Abdominal:     Palpations: Abdomen is soft.  Musculoskeletal:     Cervical back: No rigidity.  Skin:    General: Skin is warm and dry.  Neurological:     Mental Status: She is alert and oriented to person, place, and time.  Psychiatric:        Behavior: Behavior normal.    Ortho Exam some sciatic notch tenderness.  Negative logroll the hips negative straight leg raising 90 degrees.  She uses her hands when she gets up on a step both right and left.  Decreased strength quads hamstrings dorsiflexion plantarflexion diffuse.  Pedal pulses are palpable.  Specialty Comments:  No specialty comments available.  Imaging: No results found.   PMFS History: Patient Active Problem List   Diagnosis Date Noted   History of shingles 04/24/2019   Chronic midline low back pain without sciatica 02/27/2019   Chronic idiopathic constipation 04/26/2018   Aortic atherosclerosis (  HCC) 03/01/2017   Rib fracture 12/07/2016   Gas pain 03/09/2016   Anal itching 03/09/2016   Elevated LDL cholesterol level 10/24/2015   Internal hemorrhoids 05/14/2014   GERD (gastroesophageal reflux disease) 09/17/2011   IBS (irritable bowel syndrome) 08/25/2010   Gastroparesis 06/30/2010   Non-ulcer dyspepsia 04/03/2010   Past Medical History:  Diagnosis Date   Anal fissure    Chronic constipation    Diverticulosis 04-20-10   colonoscopy   Gastroparesis    GERD (gastroesophageal reflux disease)    Hemorrhoids    Hyperlipidemia    IBS (irritable bowel syndrome)    MVP (mitral valve prolapse)    Polyp, stomach 04-20-10   egd   Shingles    UTI (lower urinary tract infection)     Family History  Problem Relation Age of Onset   Hypertension Mother    Diabetes Father    Heart  attack Father        Age 48    Past Surgical History:  Procedure Laterality Date   COLONOSCOPY     ESOPHAGOGASTRODUODENOSCOPY     Social History   Occupational History   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

## 2020-08-14 ENCOUNTER — Ambulatory Visit: Payer: 59 | Admitting: Physical Therapy

## 2020-08-14 ENCOUNTER — Encounter: Payer: Self-pay | Admitting: Family Medicine

## 2020-08-14 ENCOUNTER — Ambulatory Visit: Payer: 59 | Admitting: Family Medicine

## 2020-08-14 DIAGNOSIS — R300 Dysuria: Secondary | ICD-10-CM | POA: Diagnosis not present

## 2020-08-14 LAB — URINALYSIS, COMPLETE
Bilirubin, UA: NEGATIVE
Glucose, UA: NEGATIVE
Ketones, UA: NEGATIVE
Leukocytes,UA: NEGATIVE
Nitrite, UA: NEGATIVE
Protein,UA: NEGATIVE
Specific Gravity, UA: <1.005 — ABNORMAL LOW (ref 1.005–1.030)
Urobilinogen, Ur: 0.2 mg/dL (ref 0.2–1.0)
pH, UA: 6 (ref 5.0–7.5)

## 2020-08-14 LAB — MICROSCOPIC EXAMINATION
Bacteria, UA: NONE SEEN
Epithelial Cells (non renal): NONE SEEN /hpf (ref 0–10)
RBC, Urine: NONE SEEN /hpf (ref 0–2)
Renal Epithel, UA: NONE SEEN /hpf
WBC, UA: NONE SEEN /hpf (ref 0–5)

## 2020-08-14 NOTE — Progress Notes (Signed)
Virtual Visit via telephone Note Due to COVID-19 pandemic this visit was conducted virtually. This visit type was conducted due to national recommendations for restrictions regarding the COVID-19 Pandemic (e.g. social distancing, sheltering in place) in an effort to limit this patient's exposure and mitigate transmission in our community. All issues noted in this document were discussed and addressed.  A physical exam was not performed with this format.   I connected with Ruth Terry on 08/14/2020 at 1025 by telephone and verified that I am speaking with the correct person using two identifiers. Ruth Terry is currently located at home and  no one  is currently with them during visit. The provider, Kari Baars, FNP is located in their office at time of visit.  I discussed the limitations, risks, security and privacy concerns of performing an evaluation and management service by telephone and the availability of in person appointments. I also discussed with the patient that there may be a patient responsible charge related to this service. The patient expressed understanding and agreed to proceed.  Subjective:  Patient ID: Ruth Terry, female    DOB: 06-15-1955, 65 y.o.   MRN: [id number]  Chief Complaint:  Dysuria   HPI: Ruth Terry is a 65 y.o. female presenting on 08/14/2020 for Dysuria   Dysuria  This is a new problem. The current episode started in the past 7 days. The problem occurs every urination. The problem has been gradually worsening. The quality of the pain is described as burning. The pain is at a severity of 3/10. The pain is mild. There has been no fever. She is Not sexually active. There is No history of pyelonephritis. Associated symptoms include frequency and urgency. Pertinent negatives include no chills, discharge, flank pain, hematuria, hesitancy, nausea, possible pregnancy, sweats or vomiting. She has tried increased fluids for the symptoms. The treatment  provided no relief.    Relevant past medical, surgical, family, and social history reviewed and updated as indicated.  Allergies and medications reviewed and updated.   Past Medical History:  Diagnosis Date   Anal fissure    Chronic constipation    Diverticulosis 04-20-10   colonoscopy   Gastroparesis    GERD (gastroesophageal reflux disease)    Hemorrhoids    Hyperlipidemia    IBS (irritable bowel syndrome)    MVP (mitral valve prolapse)    Polyp, stomach 04-20-10   egd   Shingles    UTI (lower urinary tract infection)     Past Surgical History:  Procedure Laterality Date   COLONOSCOPY     ESOPHAGOGASTRODUODENOSCOPY      Social History   Socioeconomic History   Marital status: Married    Spouse name: Not on file   Number of children: 0   Years of education: Not on file   Highest education level: Not on file  Occupational History   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    Employer: [employer]  Tobacco Use   Smoking status: Former    Packs/day: 0.50    Years: 6.00    Pack years: 3.00    Types: Cigarettes   Smokeless tobacco: Never   Tobacco comments:    USE SMOKELESS CIGARETTES  Vaping Use   Vaping Use: Never used  Substance and Sexual Activity   Alcohol use: Yes    Alcohol/week: 0.0 standard drinks    Comment: 1 a week, wine   Drug use: No   Sexual activity:  Not on file  Other Topics Concern   Not on file  Social History Narrative   Not on file   Social Determinants of Health   Financial Resource Strain: Not on file  Food Insecurity: Not on file  Transportation Needs: Not on file  Physical Activity: Not on file  Stress: Not on file  Social Connections: Not on file  Intimate Partner Violence: Not on file    Outpatient Encounter Medications as of 08/14/2020  Medication Sig   AMBULATORY NON FORMULARY MEDICATION Medication Name: Domperidone 10 mg Take 1 tablet at bedtime daily.   DEXILANT 60 MG capsule  Take 1 capsule (60 mg total) by mouth daily. Symptoms refractory to Prevacid and Omeprazole.  New PA needed for dexilant. May need to submit as brand   hydrocortisone (ANUSOL-HC) 25 MG suppository Use 1 suppository rectally at bedtime for 7 days then as needed   hydrocortisone cream 1 % Apply 1 application topically 2 (two) times daily.   ibandronate (BONIVA) 150 MG tablet Take 1 tablet (150 mg total) by mouth every 30 (thirty) days. Take in the morning with a full glass of water, on an empty stomach, and do not take anything else by mouth or lie down for the next 30 min.   linaclotide (LINZESS) 145 MCG CAPS capsule Take 1 capsule (145 mcg total) by mouth daily before breakfast.   metoCLOPramide (REGLAN) 5 MG tablet Take 1 tablet (5 mg total) by mouth 3 (three) times daily before meals.   Pancrelipase, Lip-Prot-Amyl, (ZENPEP) 25000-79000 units CPEP Take 2 capsules by mouth 3 (three) times daily with meals.   Probiotic Product (ALIGN) 4 MG CAPS Take 1 capsule by mouth daily.   psyllium (METAMUCIL) 58.6 % packet Take 1 packet by mouth daily.   Rectal Protectant-Emollient (CALMOL-4) 76-10 % SUPP Place 1 suppository rectally as needed (for hemmorrhoids).   Simethicone (PHAZYME MAXIMUM STRENGTH) 250 MG CAPS Take 250 mg by mouth 3 (three) times daily. (Patient taking differently: Take 250 mg by mouth 3 (three) times daily as needed (gas relief).)   sucralfate (CARAFATE) 1 g tablet Take 1 g by mouth 4 (four) times daily.   triamcinolone (NASACORT) 55 MCG/ACT AERO nasal inhaler Place 2 sprays into the nose daily.   valACYclovir (VALTREX) 1000 MG tablet TAKE 1 TABLET (1,000 MG TOTAL) BY MOUTH AT BEDTIME.   [DISCONTINUED] AMBULATORY NON FORMULARY MEDICATION Medication Name: Domperidone 10 mg Take 1 tablet at bedtime daily. (Patient taking differently: No sig reported)   No facility-administered encounter medications on file as of 08/14/2020.    Allergies  Allergen Reactions   Chlordiazepoxide-Clidinium  Itching   Ciprofloxacin Itching    Irritates stomach   Flagyl [Metronidazole]     Severe yeast infection   Sulfa Drugs Cross Reactors Nausea Only   Cefdinir Nausea And Vomiting   Nitrofurantoin Monohyd Macro Rash    Review of Systems  Constitutional:  Negative for activity change, appetite change, chills, diaphoresis, fatigue, fever and unexpected weight change.  Gastrointestinal:  Positive for constipation. Negative for abdominal distention, abdominal pain, diarrhea, nausea, rectal pain and vomiting.  Genitourinary:  Positive for dysuria, frequency and urgency. Negative for decreased urine volume, difficulty urinating, dyspareunia, enuresis, flank pain, genital sores, hematuria, hesitancy, menstrual problem, pelvic pain, vaginal bleeding and vaginal discharge.  Musculoskeletal:  Negative for back pain.  Neurological:  Negative for weakness.  All other systems reviewed and are negative.       Observations/Objective: No vital signs or physical exam, this was a telephone  or virtual health encounter.  Pt alert and oriented, answers all questions appropriately, and able to speak in full sentences.    Assessment and Plan: Caithlin was seen today for dysuria.  Diagnoses and all orders for this visit:  Dysuria Pt will bring urine sample to office for evaluation. Will treat accordingly. No reported symptoms concerning for acute pyelonephritis. Pt aware to report any new or worsening symptoms.  -     Urinalysis, Complete -     Urine Culture     Follow Up Instructions: Return if symptoms worsen or fail to improve.    I discussed the assessment and treatment plan with the patient. The patient was provided an opportunity to ask questions and all were answered. The patient agreed with the plan and demonstrated an understanding of the instructions.   The patient was advised to call back or seek an in-person evaluation if the symptoms worsen or if the condition fails to improve as  anticipated.  The above assessment and management plan was discussed with the patient. The patient verbalized understanding of and has agreed to the management plan. Patient is aware to call the clinic if they develop any new symptoms or if symptoms persist or worsen. Patient is aware when to return to the clinic for a follow-up visit. Patient educated on when it is appropriate to go to the emergency department.    I provided 12 minutes of non-face-to-face time during this encounter. The call started at 1025. The call ended at 1035. The other time was used for coordination of care.    Kari Baars, FNP-C [facility] [address] [place], [place] [address] [phone] 08/14/2020

## 2020-08-15 ENCOUNTER — Telehealth: Payer: Self-pay | Admitting: Family Medicine

## 2020-08-15 ENCOUNTER — Other Ambulatory Visit: Payer: Self-pay

## 2020-08-15 ENCOUNTER — Ambulatory Visit (INDEPENDENT_AMBULATORY_CARE_PROVIDER_SITE_OTHER): Payer: 59

## 2020-08-15 ENCOUNTER — Ambulatory Visit (INDEPENDENT_AMBULATORY_CARE_PROVIDER_SITE_OTHER): Payer: 59 | Admitting: Family Medicine

## 2020-08-15 ENCOUNTER — Encounter: Payer: Self-pay | Admitting: Family Medicine

## 2020-08-15 DIAGNOSIS — N898 Other specified noninflammatory disorders of vagina: Secondary | ICD-10-CM | POA: Diagnosis not present

## 2020-08-15 DIAGNOSIS — R1010 Upper abdominal pain, unspecified: Secondary | ICD-10-CM

## 2020-08-15 DIAGNOSIS — B370 Candidal stomatitis: Secondary | ICD-10-CM

## 2020-08-15 LAB — WET PREP FOR TRICH, YEAST, CLUE
Clue Cell Exam: NEGATIVE
Trichomonas Exam: NEGATIVE
Yeast Exam: NEGATIVE

## 2020-08-15 IMAGING — DX DG ABDOMEN 1V
1 series · 1 of 1 positions shown · non-contrast
Comparison: None.

CLINICAL DATA: Abdominal pain.

EXAM:
ABDOMEN - 1 VIEW

[abdomen kub]
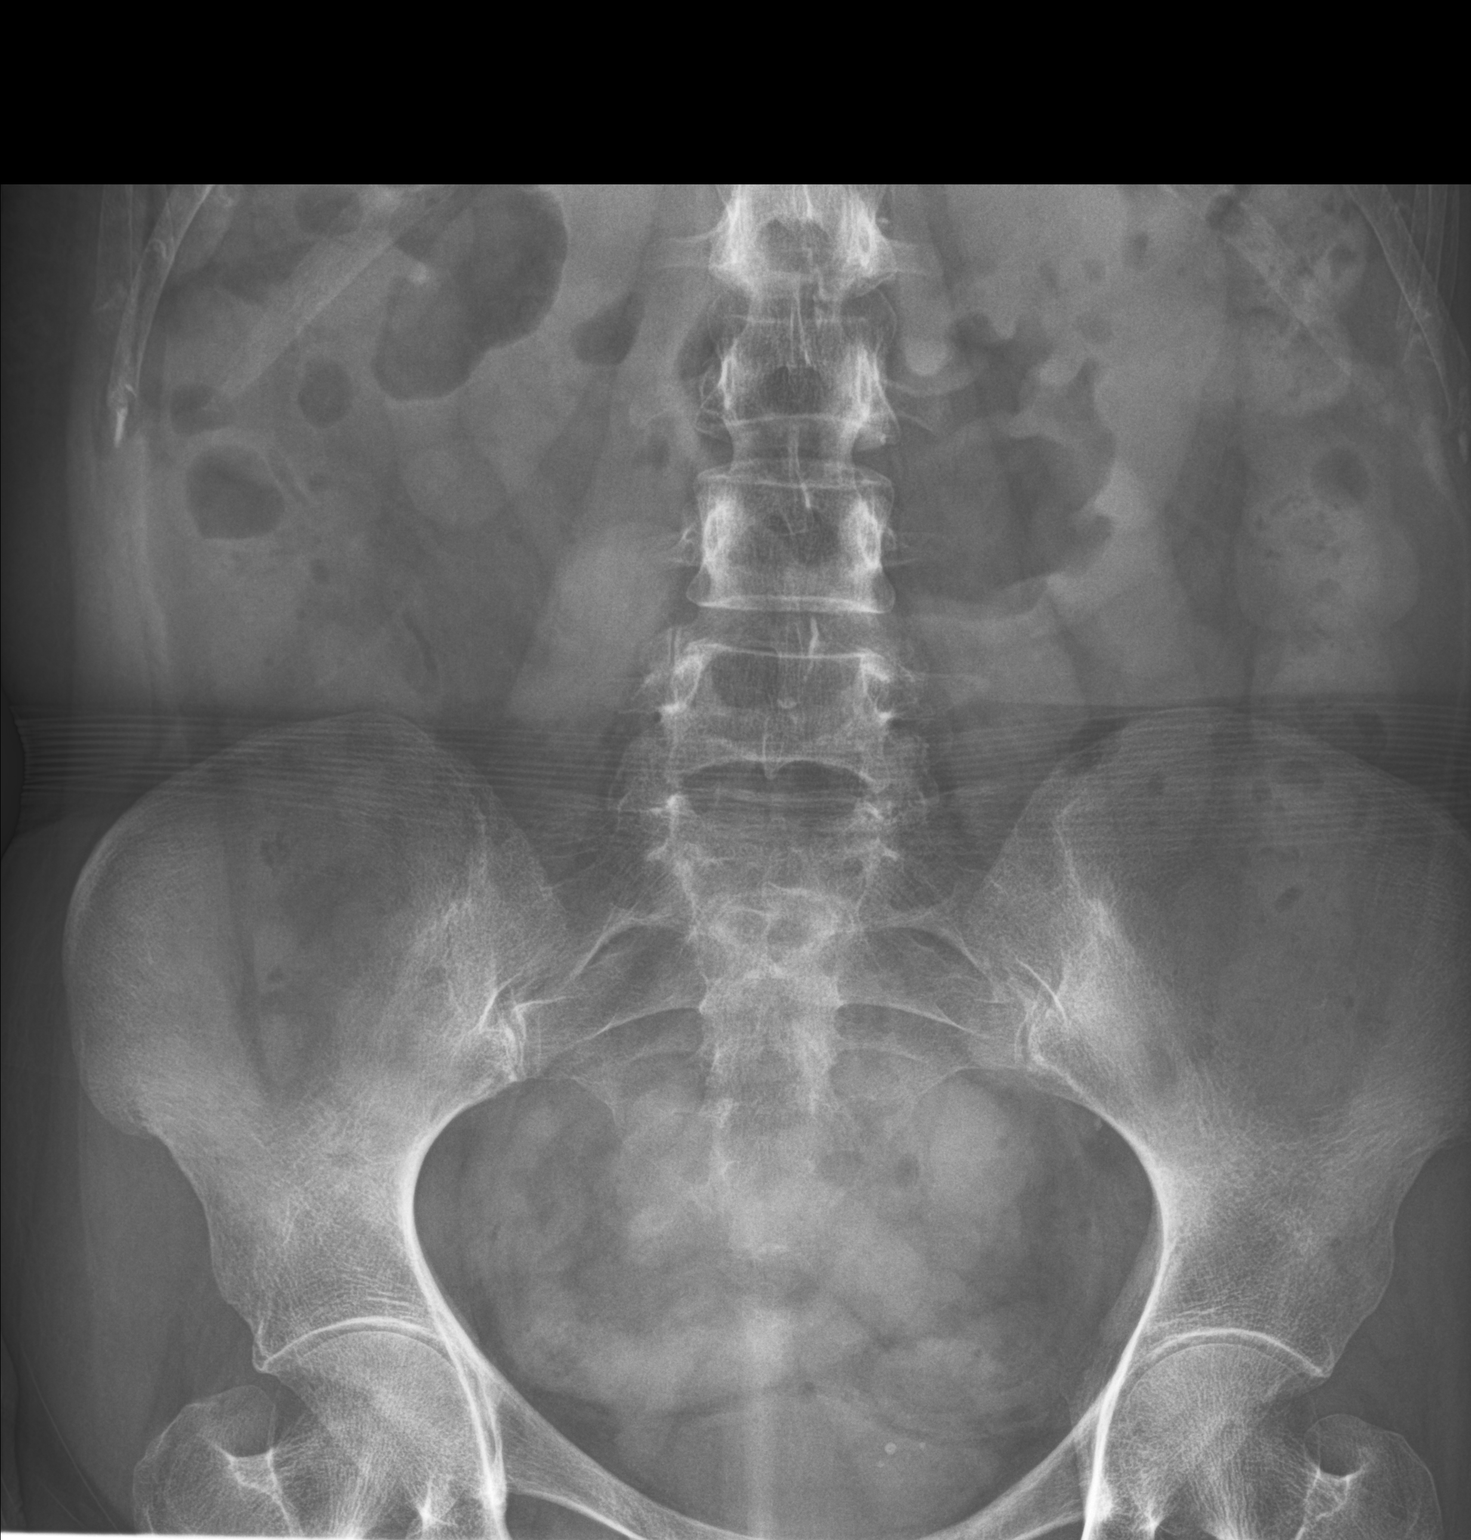

[1 of 1 positions shown; findings below may reference images not displayed]

FINDINGS: Bowel gas pattern is nonobstructive. No free peritoneal air. Mild
diffuse decreased bone mineralization. Mild symmetric degenerative
changes of the hips. Mild degenerative change of the spine.
IMPRESSION: Nonobstructive bowel gas pattern.

## 2020-08-15 MED ORDER — NYSTATIN 100000 UNIT/ML MT SUSP: 5.0000 mL | Freq: Four times a day (QID) | OROMUCOSAL | 0 refills | Status: DC

## 2020-08-15 NOTE — Telephone Encounter (Signed)
She can come in to have KUB.

## 2020-08-15 NOTE — Telephone Encounter (Signed)
Patient aware.

## 2020-08-15 NOTE — Progress Notes (Signed)
Virtual Visit via telephone Note Due to COVID-19 pandemic this visit was conducted virtually. This visit type was conducted due to national recommendations for restrictions regarding the COVID-19 Pandemic (e.g. social distancing, sheltering in place) in an effort to limit this patient's exposure and mitigate transmission in our community. All issues noted in this document were discussed and addressed.  A physical exam was not performed with this format.   I connected with Ruth Terry on 08/15/2020 at 1310 by telephone and verified that I am speaking with the correct person using two identifiers. Ruth Terry is currently located at home and family is currently with them during visit. The provider, Kari Baars, FNP is located in their office at time of visit.  I discussed the limitations, risks, security and privacy concerns of performing an evaluation and management service by telephone and the availability of in person appointments. I also discussed with the patient that there may be a patient responsible charge related to this service. The patient expressed understanding and agreed to proceed.  Subjective:  Patient ID: Ruth Terry, female    DOB: [birth year], 65 y.o.   MRN: [id number]  Chief Complaint:  Oral Pain and Vaginal Discharge   HPI: Ruth Terry is a 65 y.o. female presenting on 08/15/2020 for Oral Pain and Vaginal Discharge   Pt had telehealth visit yesterday for possible UTI. Urinalysis was not indicative of acute cystitis and culture is pending. Pt reports today she is now having vaginal irritation with discharge. States she also has a white coating on her tongue with burning. States this started yesterday and continues to get worse. She has a KUB pending for complaints of possible renal stone. She denies fever, chills, weakness, confusion, constipation, nausea, vomiting, or diarrhea. She has been on antibiotic therapy recently, prescribed by her GI specialist.   Oral  Pain  This is a new problem. The current episode started yesterday. The problem occurs constantly. The problem has been unchanged. The pain is at a severity of 4/10. The pain is mild. Pertinent negatives include no difficulty swallowing, facial pain, fever, oral bleeding, sinus pressure or thermal sensitivity. She has tried nothing for the symptoms.  Vaginal Discharge The patient's primary symptoms include genital itching and vaginal discharge. The patient's pertinent negatives include no genital lesions, genital odor, genital rash, missed menses, pelvic pain or vaginal bleeding. This is a new problem. The current episode started yesterday. The problem occurs constantly. The problem has been unchanged. The patient is experiencing no pain. She is not pregnant. Associated symptoms include dysuria, flank pain, frequency and urgency. Pertinent negatives include no abdominal pain, anorexia, back pain, chills, constipation, diarrhea, discolored urine, fever, headaches, hematuria, joint pain, joint swelling, nausea, painful intercourse, rash, sore throat or vomiting. The vaginal discharge was white. There has been no bleeding. Nothing aggravates the symptoms. She has tried nothing for the symptoms.    Relevant past medical, surgical, family, and social history reviewed and updated as indicated.  Allergies and medications reviewed and updated.   Past Medical History:  Diagnosis Date   Anal fissure    Chronic constipation    Diverticulosis 04-20-10   colonoscopy   Gastroparesis    GERD (gastroesophageal reflux disease)    Hemorrhoids    Hyperlipidemia    IBS (irritable bowel syndrome)    MVP (mitral valve prolapse)    Polyp, stomach 04-20-10   egd   Shingles    UTI (lower urinary tract infection)     Past  Surgical History:  Procedure Laterality Date   COLONOSCOPY     ESOPHAGOGASTRODUODENOSCOPY      Social History   Socioeconomic History   Marital status: Married    Spouse name: Not on file    Number of children: 0   Years of education: Not on file   Highest education level: Not on file  Occupational History   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  Other Topics Concern   Not on file  Social History Narrative   Not on file   Social Determinants of Health   Financial Resource Strain: Not on file  Food Insecurity: Not on file  Transportation Needs: Not on file  Physical Activity: Not on file  Stress: Not on file  Social Connections: Not on file  Intimate Partner Violence: Not on file    Outpatient Encounter Medications as of 08/15/2020  Medication Sig   nystatin (MYCOSTATIN) 100000 UNIT/ML suspension Take 5 mLs (500,000 Units total) by mouth 4 (four) times daily.   AMBULATORY NON FORMULARY MEDICATION Medication Name: Domperidone 10 mg Take 1 tablet at bedtime daily.   DEXILANT 60 MG capsule Take 1 capsule (60 mg total) by mouth daily. Symptoms refractory to Prevacid and Omeprazole.  New PA needed for dexilant. May need to submit as brand   hydrocortisone (ANUSOL-HC) 25 MG suppository Use 1 suppository rectally at bedtime for 7 days then as needed   hydrocortisone cream 1 % Apply 1 application topically 2 (two) times daily.   ibandronate (BONIVA) 150 MG tablet Take 1 tablet (150 mg total) by mouth every 30 (thirty) days. Take in the morning with a full glass of water, on an empty stomach, and do not take anything else by mouth or lie down for the next 30 min.   linaclotide (LINZESS) 145 MCG CAPS capsule Take 1 capsule  (145 mcg total) by mouth daily before breakfast.   metoCLOPramide (REGLAN) 5 MG tablet Take 1 tablet (5 mg total) by mouth 3 (three) times daily before meals.   Pancrelipase, Lip-Prot-Amyl, (ZENPEP) 25000-79000 units CPEP Take 2 capsules by mouth 3 (three) times daily with meals.   Probiotic Product (ALIGN) 4 MG CAPS Take 1 capsule by mouth daily.   psyllium (METAMUCIL) 58.6 % packet Take 1 packet by mouth daily.   Rectal Protectant-Emollient (CALMOL-4) 76-10 % SUPP Place 1 suppository rectally as needed (for hemmorrhoids).   Simethicone (PHAZYME MAXIMUM STRENGTH) 250 MG CAPS Take 250 mg by mouth 3 (three) times daily. (Patient taking differently: Take 250 mg by mouth 3 (three) times daily as needed (gas relief).)   sucralfate (CARAFATE) 1 g tablet Take 1 g by mouth 4 (four) times daily.   triamcinolone (NASACORT) 55 MCG/ACT AERO nasal inhaler Place 2 sprays into the nose daily.   valACYclovir (VALTREX) 1000 MG tablet TAKE 1 TABLET (1,000 MG TOTAL) BY MOUTH AT BEDTIME.   [DISCONTINUED] AMBULATORY NON FORMULARY MEDICATION Medication Name:  Domperidone 10 mg Take 1 tablet at bedtime daily. (Patient taking differently: No sig reported)   No facility-administered encounter medications on file as of 08/15/2020.    Allergies  Allergen Reactions   Chlordiazepoxide-Clidinium Itching   Ciprofloxacin Itching    Irritates stomach   Flagyl [Metronidazole]     Severe yeast infection   Sulfa Drugs Cross Reactors Nausea Only   Cefdinir Nausea And Vomiting   Nitrofurantoin Monohyd Macro Rash    Review of Systems  Constitutional:  Negative for activity change, appetite change, chills, diaphoresis, fatigue, fever and unexpected weight change.  HENT:  Negative for sinus pressure and sore throat.        White coating to tongue.  Eyes: Negative.   Respiratory:  Negative for cough, chest tightness and shortness of breath.   Cardiovascular:  Negative for chest pain, palpitations and leg swelling.   Gastrointestinal:  Negative for abdominal pain, anorexia, blood in stool, constipation, diarrhea, nausea and vomiting.  Endocrine: Negative.   Genitourinary:  Positive for dysuria, flank pain, frequency, urgency and vaginal discharge. Negative for decreased urine volume, difficulty urinating, dyspareunia, enuresis, genital sores, hematuria, missed menses, pelvic pain, vaginal bleeding and vaginal pain.  Musculoskeletal:  Negative for arthralgias, back pain, joint pain and myalgias.  Skin: Negative.  Negative for rash.  Allergic/Immunologic: Negative.   Neurological:  Negative for dizziness, weakness and headaches.  Hematological: Negative.   Psychiatric/Behavioral:  Negative for confusion, hallucinations, sleep disturbance and suicidal ideas.   All other systems reviewed and are negative.       Observations/Objective: No vital signs or physical exam, this was a telephone or virtual health encounter.  Pt alert and oriented, answers all questions appropriately, and able to speak in full sentences.    Assessment and Plan: Zeah was seen today for oral pain and vaginal discharge.  Diagnoses and all orders for this visit:  Vaginal discharge Pt will provide wet prep for evaluation, treatment pending results.  -     WET PREP FOR TRICH, YEAST, CLUE  Thrush, oral Reported symptoms consistent with thrush. Will treat with nystatin as prescribed. Pt instructed on proper use.  -     nystatin (MYCOSTATIN) 100000 UNIT/ML suspension; Take 5 mLs (500,000 Units total) by mouth 4 (four) times daily.     Follow Up Instructions: Return if symptoms worsen or fail to improve.    I discussed the assessment and treatment plan with the patient. The patient was provided an opportunity to ask questions and all were answered. The patient agreed with the plan and demonstrated an understanding of the instructions.   The patient was advised to call back or seek an in-person evaluation if the symptoms  worsen or if the condition fails to improve as anticipated.  The above assessment and management plan was discussed with the patient. The patient verbalized understanding of and has agreed to the management plan. Patient is aware to call the clinic if they develop any new symptoms or if symptoms persist or worsen. Patient is aware when to return to the clinic for a follow-up visit. Patient educated on when it is appropriate to go to the emergency department.    I provided 18 minutes of non-face-to-face time during this encounter. The call started at 1310. The call ended at 1328. The other time was used for coordination of care.    Kari Baars, FNP-C [facility] [address] [place], [place] [address] [phone] 08/15/2020

## 2020-08-15 NOTE — Telephone Encounter (Signed)
Pt called stating that she had a televisit yesterday with Dr Thayer Ohm for UTI. Pt says since then her pain has become worse. Pt worried she could have a kidney stone. Wants to know if she can come in to do xray to rule out kidney stone?  Please advise and call patient.

## 2020-08-16 LAB — URINE CULTURE: Organism ID, Bacteria: NO GROWTH

## 2020-08-21 ENCOUNTER — Other Ambulatory Visit: Payer: Self-pay

## 2020-08-21 ENCOUNTER — Ambulatory Visit: Payer: 59 | Attending: Orthopaedic Surgery | Admitting: Physical Therapy

## 2020-08-21 ENCOUNTER — Encounter: Payer: Self-pay | Admitting: Physical Therapy

## 2020-08-21 DIAGNOSIS — G8929 Other chronic pain: Secondary | ICD-10-CM | POA: Diagnosis present

## 2020-08-21 DIAGNOSIS — M545 Low back pain, unspecified: Secondary | ICD-10-CM | POA: Diagnosis present

## 2020-08-21 DIAGNOSIS — M6281 Muscle weakness (generalized): Secondary | ICD-10-CM | POA: Insufficient documentation

## 2020-08-21 DIAGNOSIS — R293 Abnormal posture: Secondary | ICD-10-CM | POA: Diagnosis not present

## 2020-08-21 NOTE — Therapy (Signed)
[facility] [address] [place], [place], [address] Phone: [phone]   Fax:  [phone]  Physical Therapy Evaluation  Patient Details  Name: Ruth Terry MRN: [id number] Date of Birth: [birth year] Referring Provider (PT): Annell Greening MD   Encounter Date: 08/21/2020   PT End of Session - 08/21/20 1628     Visit Number 1    Number of Visits 6    Date for PT Re-Evaluation 10/02/20    PT Start Time 0100    PT Stop Time 0126    PT Time Calculation (min) 26 min    Activity Tolerance Patient tolerated treatment well    Behavior During Therapy [facility] for tasks assessed/performed             Past Medical History:  Diagnosis Date   Anal fissure    Chronic constipation    Diverticulosis 04-20-10   colonoscopy   Gastroparesis    GERD (gastroesophageal reflux disease)    Hemorrhoids    Hyperlipidemia    IBS (irritable bowel syndrome)    MVP (mitral valve prolapse)    Polyp, stomach 04-20-10   egd   Shingles    UTI (lower urinary tract infection)     Past Surgical History:  Procedure Laterality Date   COLONOSCOPY     ESOPHAGOGASTRODUODENOSCOPY      There were no vitals filed for this visit.    Subjective Assessment - 08/21/20 1605     Subjective COVID-19 screen performed prior to patient entering clinic.  The patient returns to the the clinic with continued c/o low back pain.  She rates her pain at a 6/10 today and can go higher with increased activity.  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  Ice helps decrease her pain.  Bending and twisting increases her pain though she does her best to avoid these movements due to OP.    Pertinent History OP.    How long can you sit comfortably? Unlimited.    How long can you stand comfortably? 5-10 minutes    Diagnostic tests MRI reveals bulging L5-S1    Patient Stated Goals decrease pain to return to doing caregiving activities    Currently in  Pain? Yes    Pain Score 6     Pain Location Back    Pain Orientation Right;Left;Lower;Mid    Pain Descriptors / Indicators Aching    Pain Type Chronic pain    Pain Onset More than a month ago    Pain Frequency Constant    Aggravating Factors  See above.    Pain Relieving Factors See above.                [facility] PT Assessment - 08/21/20 0001       Assessment   Medical Diagnosis Chronic bilateral low back pain.    Referring Provider (PT) Annell Greening MD    Onset Date/Surgical Date --   ~one year+   Prior Therapy Yes.      Precautions   Precaution Comments OP.  No spinal loading.      Balance Screen   Has the patient fallen in the past 6 months No    Has the patient had a decrease in activity level because of a fear of falling?  Yes    Is the patient reluctant to leave their home because of a fear of falling?  No      Home Environment  Living Environment Private residence    Living Arrangements Spouse/significant other      Prior Function   Level of Independence Independent    Vocation Full time employment      Posture/Postural Control   Posture/Postural Control Postural limitations    Postural Limitations Rounded Shoulders;Forward head;Increased thoracic kyphosis;Decreased lumbar lordosis      AROM   Overall AROM Comments WNL for bilateral LE's.      Strength   Overall Strength --   Bilateral hip abduction is 4- to 4/5.  Bilateral knee extension and flexion is 4 to 4+/5.  Bilateral ankle dorsiflexion is 4+/5.     Palpation   Palpation comment Tender to palpation over right QL and lateral hip muscualture.      Special Tests   Other special tests Negative Romberg test but a bit wobbly.  Equal leg lengths. (-) SLR testing.      Ambulation/Gait   Gait Comments Cautious gait pattern.  No LOB while ambulating in clinic.                        Objective measurements completed on examination: See above findings.                 PT  Short Term Goals - 02/13/20 1543       PT SHORT TERM GOAL #1   Title Patient will report at least 25% improvement in symptoms for improved quality of life.    Time 3    Period Weeks    Status New    Target Date 03/05/20      PT SHORT TERM GOAL #2   Title Patient will manifest improved hamstring flexibility as evidenced by lacking 30 degrees extension for 90/90 SLR    Baseline lacking 55 degrees extension    Time 3    Period Weeks    Status New    Target Date 03/05/20      PT SHORT TERM GOAL #3   Title Patient will report pain not exceeding 2/10 when standing to improve activity tolerance    Baseline 3/10 at time of evaluation    Time 3    Period Weeks    Status New    Target Date 03/05/20               PT Long Term Goals - 08/21/20 1714       PT LONG TERM GOAL #1   Title Patient will be independent with HEP    Time 6    Period Weeks    Status New      PT LONG TERM GOAL #2   Title Perform ADL's with pain not > 3/10.    Time 6    Period Weeks    Status New      PT LONG TERM GOAL #3   Title Bilateral hip and knee strength to 5/5 to increase stability for functional tasks.    Time 6    Period Weeks    Status New                    Plan - 08/21/20 1702     Clinical Impression Statement The patient presents to OPPT with continued c/o low back pain.  Upon assessemnt today she was found to be tender to palpation over her right QL and right lateral hip musculature.  She demonstrates LE weakness and reports feeling unstaedy with standing.  She demonstrated a negative  Romberg test but was "wobbly".  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  Patient will benefit from skilled physical therapy intervention to address pain and deficits.    Personal Factors and Comorbidities Comorbidity 1    Comorbidities OP.    Examination-Activity Limitations Other;Locomotion Level;Stand    Examination-Participation  Restrictions Other    Stability/Clinical Decision Making Stable/Uncomplicated    Clinical Decision Making Low    Rehab Potential Good    PT Frequency 2x / week    PT Duration 3 weeks    PT Treatment/Interventions ADLs/Self Care Home Management;Cryotherapy;Electrical Stimulation;Moist Heat;Ultrasound;Functional mobility training;Therapeutic activities;Therapeutic exercise;Manual techniques;Passive range of motion;Patient/family education    PT Next Visit Plan Core exercise progression.  No spinal loading.  STW/M to right QL/lateral hip musculature.    Consulted and Agree with Plan of Care Patient             Patient will benefit from skilled therapeutic intervention in order to improve the following deficits and impairments:  Abnormal gait, Decreased activity tolerance, Decreased strength, Pain, Postural dysfunction, Increased muscle spasms  Visit Diagnosis: Abnormal posture - Plan: PT plan of care cert/re-cert  Muscle weakness (generalized) - Plan: PT plan of care cert/re-cert  Chronic bilateral low back pain, unspecified whether sciatica present - Plan: PT plan of care cert/re-cert     Problem List Patient Active Problem List   Diagnosis Date Noted   History of shingles 04/24/2019   Chronic midline low back pain without sciatica 02/27/2019   Chronic idiopathic constipation 04/26/2018   Aortic atherosclerosis (HCC) 03/01/2017   Rib fracture 12/07/2016   Gas pain 03/09/2016   Anal itching 03/09/2016   Elevated LDL cholesterol level 10/24/2015   Internal hemorrhoids 05/14/2014   GERD (gastroesophageal reflux disease) 09/17/2011   IBS (irritable bowel syndrome) 08/25/2010   Gastroparesis 06/30/2010   Non-ulcer dyspepsia 04/03/2010    Yamilette Garretson, [place] MPT 08/21/2020, 5:18 PM  [facility] [address] [place], [place], [address] Phone: [phone]   Fax:  [phone]  Name: Ruth Terry MRN: [id number] Date of Birth:  [birth year]

## 2020-08-22 ENCOUNTER — Encounter: Payer: Self-pay | Admitting: Family Medicine

## 2020-08-22 ENCOUNTER — Ambulatory Visit (INDEPENDENT_AMBULATORY_CARE_PROVIDER_SITE_OTHER): Payer: 59 | Admitting: Family Medicine

## 2020-08-22 VITALS — BP 126/77 | HR 99 | Temp 97.7°F | Ht 60.0 in | Wt 106.6 lb

## 2020-08-22 DIAGNOSIS — R4189 Other symptoms and signs involving cognitive functions and awareness: Secondary | ICD-10-CM

## 2020-08-22 DIAGNOSIS — M5126 Other intervertebral disc displacement, lumbar region: Secondary | ICD-10-CM

## 2020-08-22 DIAGNOSIS — M5416 Radiculopathy, lumbar region: Secondary | ICD-10-CM

## 2020-08-22 DIAGNOSIS — K582 Mixed irritable bowel syndrome: Secondary | ICD-10-CM

## 2020-08-22 DIAGNOSIS — M810 Age-related osteoporosis without current pathological fracture: Secondary | ICD-10-CM

## 2020-08-22 DIAGNOSIS — J310 Chronic rhinitis: Secondary | ICD-10-CM

## 2020-08-22 DIAGNOSIS — M5136 Other intervertebral disc degeneration, lumbar region: Secondary | ICD-10-CM

## 2020-08-22 DIAGNOSIS — F411 Generalized anxiety disorder: Secondary | ICD-10-CM

## 2020-08-22 DIAGNOSIS — J329 Chronic sinusitis, unspecified: Secondary | ICD-10-CM

## 2020-08-22 DIAGNOSIS — R5382 Chronic fatigue, unspecified: Secondary | ICD-10-CM

## 2020-08-22 DIAGNOSIS — U099 Post covid-19 condition, unspecified: Secondary | ICD-10-CM

## 2020-08-22 MED ORDER — DULOXETINE HCL 60 MG PO CPEP: 60.0000 mg | ORAL_CAPSULE | Freq: Every day | ORAL | 3 refills | Status: DC

## 2020-08-22 MED ORDER — LEVOCETIRIZINE DIHYDROCHLORIDE 5 MG PO TABS: 5.0000 mg | ORAL_TABLET | Freq: Every evening | ORAL | 3 refills | Status: AC

## 2020-08-22 MED ORDER — TRULANCE 3 MG PO TABS
1.0000 | ORAL_TABLET | Freq: Every day | ORAL | 0 refills | Status: DC
Start: 2020-08-22 — End: 2020-10-22

## 2020-08-22 NOTE — Patient Instructions (Signed)
Will work on prolia  Taking the medicine as directed and not missing any doses is one of the best things you can do to treat your anxiety/ musculoskeletal pain.  Here are some things to keep in mind:  Side effects (stomach upset, some increased anxiety) may happen before you notice a benefit.  These side effects typically go away over time. Changes to your dose of medicine or a change in medication all together is sometimes necessary Most people need to be on medication at least 12 months Many people will notice an improvement within two weeks but the full effect of the medication can take up to 4-6 weeks Stopping the medication when you start feeling better often results in a return of symptoms Never discontinue your medication without contacting a health care professional first.  Some medications require gradual discontinuation/ taper and can make you sick if you stop them abruptly.  If your symptoms worsen or you have thoughts of suicide/homicide, PLEASE SEEK IMMEDIATE MEDICAL ATTENTION.  You may always call:  National Suicide Hotline: 9472635317 [place]: 740-049-7981 Crisis Recovery in [place]: (857)221-4735   These are available 24 hours a day, 7 days a week.

## 2020-08-22 NOTE — Progress Notes (Signed)
Subjective: [id number] concerns PCP: [name], DO [id number] L Chami is a 65 y.o. female presenting to clinic today for:  1.  IBS Patient with hypomotility disorder and is currently treated with domperidone.  She will be transitioning over to Reglan soon due to issues getting the medication.  She reports difficulty with passing bowel movements.  This is despite use of Linzess.  She has previously been on 290 mcg in the past felt that this was too strong.  She would like a rectal exam today because of the straining.  2.  Osteoporosis Patient is currently treated with Boniva but she would like to transition over to something else.  Considering injection versus infusion.  Would like advice about this.  She does report some aches and pains.  She admits that she has known bulging disc.  She is working with Dr. Lorin Mercy closely on this.  Currently enrolled in physical therapy  3.  Reactive depression, stress at home Patient reports reactive depression, stress at home.  She would like to consider starting something as she is on her wits end.  She has several people that are depended on her and she feels overstretched.  4.  Facial pain/dizziness Patient reports some facial pain/dizziness that has been intermittent and ongoing since she had COVID.  She uses a nasal spray but does not have any oral antihistamines on board.   ROS: Per HPI  Allergies  Allergen Reactions   Chlordiazepoxide-Clidinium Itching   Ciprofloxacin Itching    Irritates stomach   Flagyl [Metronidazole]     Severe yeast infection   Sulfa Drugs Cross Reactors Nausea Only   Cefdinir Nausea And Vomiting   Nitrofurantoin Monohyd Macro Rash   Past Medical History:  Diagnosis Date   Anal fissure    Chronic constipation    Diverticulosis 04-20-10   colonoscopy   Gastroparesis    GERD (gastroesophageal reflux disease)    Hemorrhoids    Hyperlipidemia    IBS (irritable bowel syndrome)    MVP (mitral valve  prolapse)    Polyp, stomach 04-20-10   egd   Shingles    UTI (lower urinary tract infection)     Current Outpatient Medications:    AMBULATORY NON FORMULARY MEDICATION, Medication Name: Domperidone 10 mg Take 1 tablet at bedtime daily., Disp: 90 tablet, Rfl: 1   DEXILANT 60 MG capsule, Take 1 capsule (60 mg total) by mouth daily. Symptoms refractory to Prevacid and Omeprazole.  New PA needed for dexilant. May need to submit as brand, Disp: 90 capsule, Rfl: 3   hydrocortisone (ANUSOL-HC) 25 MG suppository, Use 1 suppository rectally at bedtime for 7 days then as needed, Disp: 12 suppository, Rfl: 3   hydrocortisone cream 1 %, Apply 1 application topically 2 (two) times daily., Disp: 30 g, Rfl: 0   ibandronate (BONIVA) 150 MG tablet, Take 1 tablet (150 mg total) by mouth every 30 (thirty) days. Take in the morning with a full glass of water, on an empty stomach, and do not take anything else by mouth or lie down for the next 30 min., Disp: 3 tablet, Rfl: 0   linaclotide (LINZESS) 145 MCG CAPS capsule, Take 1 capsule (145 mcg total) by mouth daily before breakfast., Disp: 90 capsule, Rfl: 3   metoCLOPramide (REGLAN) 5 MG tablet, Take 1 tablet (5 mg total) by mouth 3 (three) times daily before meals., Disp: 90 tablet, Rfl: 6   nystatin (MYCOSTATIN) 100000 UNIT/ML suspension, Take 5 mLs (500,000 Units total) by mouth  4 (four) times daily., Disp: 60 mL, Rfl: 0   Pancrelipase, Lip-Prot-Amyl, (ZENPEP) 25000-79000 units CPEP, Take 2 capsules by mouth 3 (three) times daily with meals., Disp: 180 capsule, Rfl: 3   Probiotic Product (ALIGN) 4 MG CAPS, Take 1 capsule by mouth daily., Disp: , Rfl:    psyllium (METAMUCIL) 58.6 % packet, Take 1 packet by mouth daily., Disp: , Rfl:    Rectal Protectant-Emollient (CALMOL-4) 76-10 % SUPP, Place 1 suppository rectally as needed (for hemmorrhoids)., Disp: 30 suppository, Rfl: 1   Simethicone (PHAZYME MAXIMUM STRENGTH) 250 MG CAPS, Take 250 mg by mouth 3 (three) times  daily. (Patient taking differently: Take 250 mg by mouth 3 (three) times daily as needed (gas relief).), Disp: 14 capsule, Rfl:    sucralfate (CARAFATE) 1 g tablet, Take 1 g by mouth 4 (four) times daily., Disp: , Rfl:    triamcinolone (NASACORT) 55 MCG/ACT AERO nasal inhaler, Place 2 sprays into the nose daily., Disp: 1 each, Rfl: 12   valACYclovir (VALTREX) 1000 MG tablet, TAKE 1 TABLET (1,000 MG TOTAL) BY MOUTH AT BEDTIME., Disp: 90 tablet, Rfl: 1 Social History   Socioeconomic History   Marital status: Married    Spouse name: Not on file   Number of children: 0   Years of education: Not on file   Highest education level: Not on file  Occupational History   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    Employer: [employer]  Tobacco Use   Smoking status: Former    Packs/day: 0.50    Years: 6.00    Pack years: 3.00    Types: Cigarettes   Smokeless tobacco: Never   Tobacco comments:    USE SMOKELESS CIGARETTES  Vaping Use   Vaping Use: Never used  Substance and Sexual Activity   Alcohol use: Yes    Alcohol/week: 0.0 standard drinks    Comment: 1 a week, wine   Drug use: No   Sexual activity: Not on file  Other Topics Concern   Not on file  Social History Narrative   Not on file   Social Determinants of Health   Financial Resource Strain: Not on file  Food Insecurity: Not on file  Transportation Needs: Not on file  Physical Activity: Not on file  Stress: Not on file  Social Connections: Not on file  Intimate Partner Violence: Not on file   Family History  Problem Relation Age of Onset   Hypertension Mother    Diabetes Father    Heart attack Father        Age 45    Objective: Office vital signs reviewed. BP 126/77   Pulse 99   Temp 97.7 F (36.5 C)   Ht 5' (1.524 m)   Wt 106 lb 9.6 oz (48.4 kg)   SpO2 98%   BMI 20.82 kg/m   Physical Examination:  General: Awake, alert, nontoxic female, No acute distress HEENT:  Normal; oropharynx without masses.  TMs intact bilaterally. Rectal: Rectum without fissure, erythema or hemorrhoids.  No drainage or discharge.  No bleeding. MSK: Ambulating independently.  Tone is fair Psych: Anxious.  Speech slightly pressured.  Thought process is linear.  Depression screen [facility] 2/9 08/22/2020 07/14/2020 07/08/2020  Decreased Interest '2 1 1  '$ Down, Depressed, Hopeless '2 2 2  '$ PHQ - 2 Score '4 3 3  '$ Altered sleeping 1 1 0  Tired, decreased energy '3 3 2  '$ Change in appetite 0 0 0  Feeling  bad or failure about yourself  0 0 0  Trouble concentrating '2 1 2  '$ Moving slowly or fidgety/restless '1 1 1  '$ Suicidal thoughts 0 0 0  PHQ-9 Score '11 9 8  '$ Difficult doing work/chores - Somewhat difficult Not difficult at all  Some recent data might be hidden   GAD 7 : Generalized Anxiety Score 08/22/2020 07/14/2020 05/16/2020 12/11/2019  Nervous, Anxious, on Edge '3 3 3 3  '$ Control/stop worrying '3 3 3 3  '$ Worry too much - different things '2 2 3 '$ 0  Trouble relaxing '2 2 3 1  '$ Restless 1 1 0 0  Easily annoyed or irritable 1 1 0 0  Afraid - awful might happen 2 2 0 0  Total GAD 7 Score '14 14 12 7  '$ Anxiety Difficulty Somewhat difficult Somewhat difficult - Not difficult at all     Assessment/ Plan: 65 y.o. female   Bulging lumbar disc - Plan: DULoxetine (CYMBALTA) 60 MG capsule  Lumbar radiculopathy - Plan: DULoxetine (CYMBALTA) 60 MG capsule  Age-related osteoporosis without current pathological fracture  Rhinosinusitis - Plan: levocetirizine (XYZAL) 5 MG tablet  COVID-19 long hauler manifesting chronic fatigue  Brain fog  Generalized anxiety disorder - Plan: DULoxetine (CYMBALTA) 60 MG capsule  Irritable bowel syndrome with both constipation and diarrhea - Plan: Plecanatide (TRULANCE) 3 MG TABS  Trial of Cymbalta.  Continue to follow-up with Dr. Lorin Mercy as directed  Will reach out to the [employer] team to see if perhaps her new insurance will pay for this now.  I think this would be an  appropriate replacement for her bisphosphonate  Add Xyzal to Nasonex.  Encouraged nasal rinses  Suspect that her chronic fatigue is likely a manifestation of COVID-19 infection, uncontrolled anxiety and likely generalized deconditioning in the setting of multiple medical issues.  Start Cymbalta as above.  Would like to reassess in the next 6 weeks  Samples of Trulance provided to use in lieu of Linzess.  Will prescribe if she finds this helpful  No orders of the defined types were placed in this encounter.  No orders of the defined types were placed in this encounter.    [name], DO [place] [phone]

## 2020-08-27 NOTE — Progress Notes (Signed)
Sending to benefit verification

## 2020-08-28 ENCOUNTER — Other Ambulatory Visit: Payer: Self-pay

## 2020-08-28 ENCOUNTER — Ambulatory Visit: Payer: 59 | Admitting: Family Medicine

## 2020-08-28 ENCOUNTER — Ambulatory Visit: Payer: 59 | Admitting: Physical Therapy

## 2020-08-28 ENCOUNTER — Encounter: Payer: Self-pay | Admitting: Family Medicine

## 2020-08-28 VITALS — BP 133/81 | HR 101 | Temp 98.1°F | Ht 61.0 in | Wt 106.8 lb

## 2020-08-28 DIAGNOSIS — M2550 Pain in unspecified joint: Secondary | ICD-10-CM

## 2020-08-28 MED ORDER — KETOROLAC TROMETHAMINE 30 MG/ML IJ SOLN: 30.0000 mg | Freq: Once | INTRAMUSCULAR | Status: AC

## 2020-08-28 NOTE — Progress Notes (Signed)
Assessment & Plan:  1. Polyarthralgia - will attempt toradol injection for inflammation as she can't tolerate po NSAIDs. - She refused offered toradol injection   Follow up plan: Return if symptoms worsen or fail to improve.  Floy Sabina, NP Student   Subjective:   Patient ID: Ruth Terry, female    DOB: 1955/08/20, 65 y.o.   MRN: [id number]  HPI: Ruth Terry is a 65 y.o. female presenting on 08/28/2020 for Joint Pain (All over joint pain x 2 days )   She states she is having inflammation in all of her joints from head to toe that started 2-3 days ago. She states she gets like this from time to time and the only thing that helps is prednisone. She states her GI doctor told her not to take NSAIDs because of her gastroparesis.    ROS: Negative unless specifically indicated above in HPI.   Relevant past medical history reviewed and updated as indicated.   Allergies and medications reviewed and updated.   Current Outpatient Medications:    AMBULATORY NON FORMULARY MEDICATION, Medication Name: Domperidone 10 mg Take 1 tablet at bedtime daily., Disp: 90 tablet, Rfl: 1   DEXILANT 60 MG capsule, Take 1 capsule (60 mg total) by mouth daily. Symptoms refractory to Prevacid and Omeprazole.  New PA needed for dexilant. May need to submit as brand, Disp: 90 capsule, Rfl: 3   DULoxetine (CYMBALTA) 60 MG capsule, Take 1 capsule (60 mg total) by mouth daily., Disp: 90 capsule, Rfl: 3   hydrocortisone (ANUSOL-HC) 25 MG suppository, Use 1 suppository rectally at bedtime for 7 days then as needed, Disp: 12 suppository, Rfl: 3   hydrocortisone cream 1 %, Apply 1 application topically 2 (two) times daily., Disp: 30 g, Rfl: 0   ibandronate (BONIVA) 150 MG tablet, Take 1 tablet (150 mg total) by mouth every 30 (thirty) days. Take in the morning with a full glass of water, on an empty stomach, and do not take anything else by mouth or lie down for the next 30 min., Disp: 3 tablet, Rfl: 0    levocetirizine (XYZAL) 5 MG tablet, Take 1 tablet (5 mg total) by mouth every evening. For allergies, Disp: 90 tablet, Rfl: 3   linaclotide (LINZESS) 145 MCG CAPS capsule, Take 1 capsule (145 mcg total) by mouth daily before breakfast., Disp: 90 capsule, Rfl: 3   metoCLOPramide (REGLAN) 5 MG tablet, Take 1 tablet (5 mg total) by mouth 3 (three) times daily before meals., Disp: 90 tablet, Rfl: 6   nystatin (MYCOSTATIN) 100000 UNIT/ML suspension, Take 5 mLs (500,000 Units total) by mouth 4 (four) times daily., Disp: 60 mL, Rfl: 0   Pancrelipase, Lip-Prot-Amyl, (ZENPEP) 25000-79000 units CPEP, Take 2 capsules by mouth 3 (three) times daily with meals., Disp: 180 capsule, Rfl: 3   Plecanatide (TRULANCE) 3 MG TABS, Take 1 tablet by mouth daily., Disp: 4 tablet, Rfl: 0   Probiotic Product (ALIGN) 4 MG CAPS, Take 1 capsule by mouth daily., Disp: , Rfl:    psyllium (METAMUCIL) 58.6 % packet, Take 1 packet by mouth daily., Disp: , Rfl:    Rectal Protectant-Emollient (CALMOL-4) 76-10 % SUPP, Place 1 suppository rectally as needed (for hemmorrhoids)., Disp: 30 suppository, Rfl: 1   Simethicone (PHAZYME MAXIMUM STRENGTH) 250 MG CAPS, Take 250 mg by mouth 3 (three) times daily. (Patient taking differently: Take 250 mg by mouth 3 (three) times daily as needed (gas relief).), Disp: 14 capsule, Rfl:    sucralfate (CARAFATE) 1  g tablet, Take 1 g by mouth 4 (four) times daily., Disp: , Rfl:    triamcinolone (NASACORT) 55 MCG/ACT AERO nasal inhaler, Place 2 sprays into the nose daily., Disp: 1 each, Rfl: 12   valACYclovir (VALTREX) 1000 MG tablet, TAKE 1 TABLET (1,000 MG TOTAL) BY MOUTH AT BEDTIME., Disp: 90 tablet, Rfl: 1  Current Facility-Administered Medications:    ketorolac (TORADOL) 30 MG/ML injection 30 mg, 30 mg, Intramuscular, Once, Alona Bene, Britney F, FNP  Allergies  Allergen Reactions   Chlordiazepoxide-Clidinium Itching   Ciprofloxacin Itching    Irritates stomach   Flagyl [Metronidazole]     Severe  yeast infection   Sulfa Drugs Cross Reactors Nausea Only   Cefdinir Nausea And Vomiting   Nitrofurantoin Monohyd Macro Rash    Objective:   BP 133/81   Pulse (!) 101   Temp 98.1 F (36.7 C) (Temporal)   Ht 5\' 1"  (1.549 m)   Wt 48.4 kg   SpO2 98%   BMI 20.18 kg/m    Physical Exam Constitutional:      General: She is not in acute distress.    Appearance: Normal appearance. She is normal weight. She is not ill-appearing, toxic-appearing or diaphoretic.  HENT:     Nose: Nose normal.     Mouth/Throat:     Mouth: Mucous membranes are moist.     Pharynx: Oropharynx is clear.  Eyes:     Conjunctiva/sclera: Conjunctivae normal.     Pupils: Pupils are equal, round, and reactive to light.  Cardiovascular:     Rate and Rhythm: Normal rate and regular rhythm.     Pulses: Normal pulses.     Heart sounds: Normal heart sounds.  Pulmonary:     Effort: Pulmonary effort is normal.     Breath sounds: Normal breath sounds.  Abdominal:     General: Abdomen is flat.     Palpations: Abdomen is soft.  Musculoskeletal:        General: Tenderness (all moveable joints) present. Normal range of motion.     Cervical back: Normal range of motion and neck supple. No rigidity or tenderness.  Skin:    General: Skin is warm and dry.  Neurological:     General: No focal deficit present.     Mental Status: She is alert and oriented to person, place, and time.  Psychiatric:        Mood and Affect: Mood normal.        Behavior: Behavior normal.        Thought Content: Thought content normal.        Judgment: Judgment normal.

## 2020-08-29 ENCOUNTER — Other Ambulatory Visit: Payer: Self-pay | Admitting: Family Medicine

## 2020-08-29 DIAGNOSIS — M2550 Pain in unspecified joint: Secondary | ICD-10-CM

## 2020-08-29 DIAGNOSIS — M5416 Radiculopathy, lumbar region: Secondary | ICD-10-CM

## 2020-08-29 MED ORDER — METHYLPREDNISOLONE ACETATE 40 MG/ML IJ SUSP: 40.0000 mg | Freq: Once | INTRAMUSCULAR | Status: DC

## 2020-08-29 MED ORDER — PREDNISONE 20 MG PO TABS: 20.0000 mg | ORAL_TABLET | Freq: Every day | ORAL | 0 refills | Status: AC

## 2020-08-29 MED ORDER — PREDNISONE 20 MG PO TABS: 20.0000 mg | ORAL_TABLET | Freq: Every day | ORAL | 0 refills | Status: DC

## 2020-08-29 NOTE — Progress Notes (Signed)
Seen by FNP yesterday.  Apparently having acute on chronic polyarthritic flare.  Unable to take NSAID secondary to GI issues.  I have sent in a short burst of prednisone.  She is to seek further care from her orthopedist

## 2020-09-05 ENCOUNTER — Encounter (HOSPITAL_COMMUNITY): Payer: Self-pay

## 2020-09-05 ENCOUNTER — Emergency Department (HOSPITAL_COMMUNITY): Payer: 59

## 2020-09-05 ENCOUNTER — Other Ambulatory Visit: Payer: Self-pay

## 2020-09-05 ENCOUNTER — Emergency Department (HOSPITAL_COMMUNITY)
Admission: EM | Admit: 2020-09-05 | Discharge: 2020-09-05 | Disposition: A | Discharge status: Home / Self Care | Payer: 59 | Attending: Emergency Medicine | Admitting: Emergency Medicine

## 2020-09-05 DIAGNOSIS — Z87891 Personal history of nicotine dependence: Secondary | ICD-10-CM | POA: Diagnosis not present

## 2020-09-05 DIAGNOSIS — K5900 Constipation, unspecified: Secondary | ICD-10-CM | POA: Diagnosis not present

## 2020-09-05 DIAGNOSIS — K219 Gastro-esophageal reflux disease without esophagitis: Secondary | ICD-10-CM | POA: Insufficient documentation

## 2020-09-05 DIAGNOSIS — R1084 Generalized abdominal pain: Secondary | ICD-10-CM | POA: Diagnosis not present

## 2020-09-05 LAB — COMPREHENSIVE METABOLIC PANEL
ALT: 14 U/L (ref 0–44)
AST: 17 U/L (ref 15–41)
Albumin: 4.1 g/dL (ref 3.5–5.0)
Alkaline Phosphatase: 68 U/L (ref 38–126)
Anion gap: 8 (ref 5–15)
BUN: 7 mg/dL — ABNORMAL LOW (ref 8–23)
CO2: 27 mmol/L (ref 22–32)
Calcium: 9.2 mg/dL (ref 8.9–10.3)
Chloride: 105 mmol/L (ref 98–111)
Creatinine, Ser: 0.73 mg/dL (ref 0.44–1.00)
GFR, Estimated: >60 mL/min (ref >60)
Glucose, Bld: 98 mg/dL (ref 70–99)
Potassium: 3.3 mmol/L — ABNORMAL LOW (ref 3.5–5.1)
Sodium: 140 mmol/L (ref 135–145)
Total Bilirubin: 0.4 mg/dL (ref 0.3–1.2)
Total Protein: 6.8 g/dL (ref 6.5–8.1)

## 2020-09-05 LAB — CBC WITH DIFFERENTIAL/PLATELET
Abs Immature Granulocytes: 0.01 10*3/uL (ref 0.00–0.07)
Basophils Absolute: 0.1 10*3/uL (ref 0.0–0.1)
Basophils Relative: 1 %
Eosinophils Absolute: 0.1 10*3/uL (ref 0.0–0.5)
Eosinophils Relative: 2 %
HCT: 40.2 % (ref 36.0–46.0)
Hemoglobin: 13.5 g/dL (ref 12.0–15.0)
Immature Granulocytes: 0 %
Lymphocytes Relative: 30 %
Lymphs Abs: 1.2 10*3/uL (ref 0.7–4.0)
MCH: 30.9 pg (ref 26.0–34.0)
MCHC: 33.6 g/dL (ref 30.0–36.0)
MCV: 92 fL (ref 80.0–100.0)
Monocytes Absolute: 0.4 10*3/uL (ref 0.1–1.0)
Monocytes Relative: 10 %
Neutro Abs: 2.3 10*3/uL (ref 1.7–7.7)
Neutrophils Relative %: 57 %
Platelets: 216 10*3/uL (ref 150–400)
RBC: 4.37 MIL/uL (ref 3.87–5.11)
RDW: 13.5 % (ref 11.5–15.5)
WBC: 4 10*3/uL (ref 4.0–10.5)
nRBC: 0 % (ref 0.0–0.2)

## 2020-09-05 LAB — URINALYSIS, ROUTINE W REFLEX MICROSCOPIC
Bilirubin Urine: NEGATIVE
Glucose, UA: NEGATIVE mg/dL
Hgb urine dipstick: NEGATIVE
Ketones, ur: NEGATIVE mg/dL
Leukocytes,Ua: NEGATIVE
Nitrite: NEGATIVE
Protein, ur: NEGATIVE mg/dL
Specific Gravity, Urine: <1.005 — ABNORMAL LOW (ref 1.005–1.030)
pH: 6.5 (ref 5.0–8.0)

## 2020-09-05 LAB — LIPASE, BLOOD: Lipase: 33 U/L (ref 11–51)

## 2020-09-05 IMAGING — CT CT ABD-PELV W/ CM
2 of 5 series · 16 of 46 positions shown, 18 images · IV contrast (Omnipaque or Isovue)
Comparison: [DATE]

CLINICAL DATA: Abdominal pain, nausea and constipation.

EXAM:
CT ABDOMEN AND PELVIS WITH CONTRAST
TECHNIQUE: Multidetector CT imaging of the abdomen and pelvis was performed
using the standard protocol following bolus administration of
intravenous contrast.
CONTRAST:  60mL OMNIPAQUE IOHEXOL 350 MG/ML SOLN

[Series 2: axial st · axial · 0.81mm/px · z∈[+906,+1306]mm · 13 of 91 slices shown, 15 images]
[im 6/91  soft-tissue]
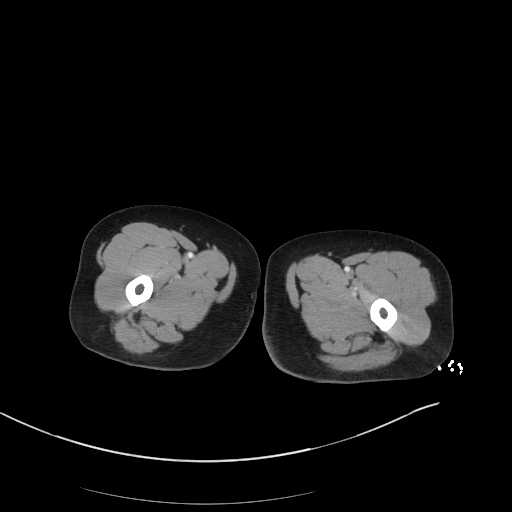
[im 6/91  bone]
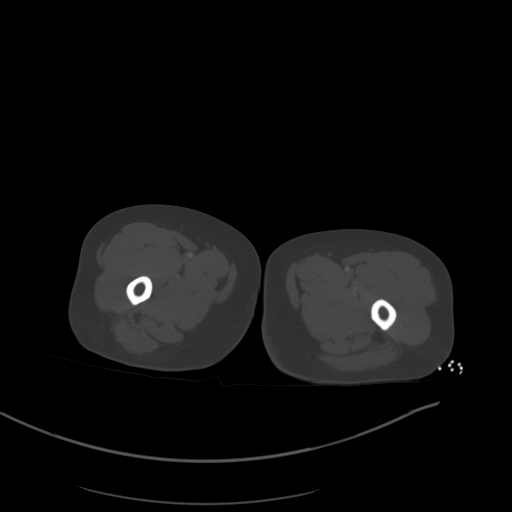
[im 11/91  soft-tissue]
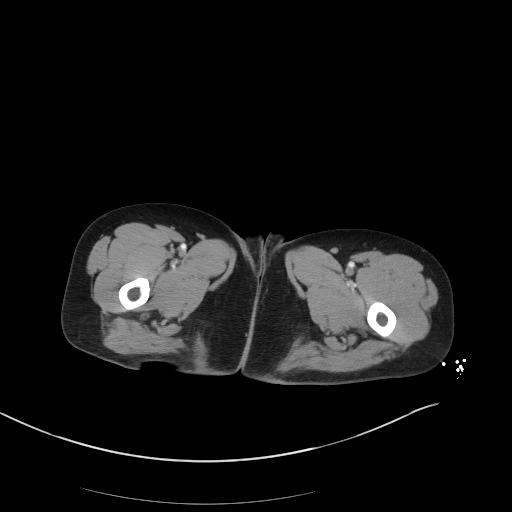
[im 21/91  soft-tissue]
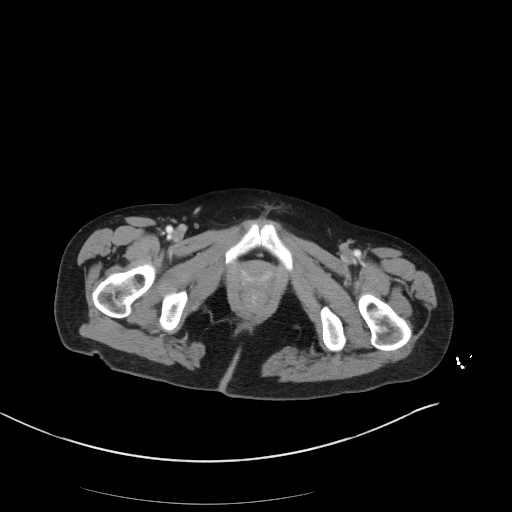
[im 26/91  soft-tissue]
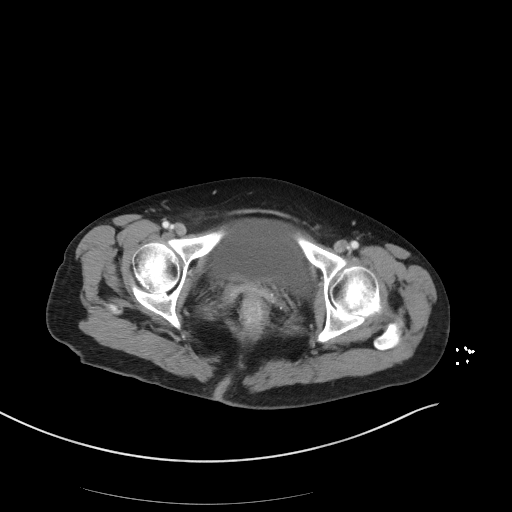
[im 31/91  soft-tissue]
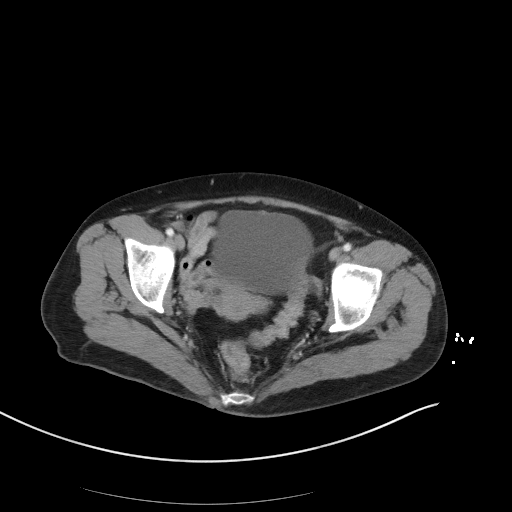
[im 41/91  soft-tissue]
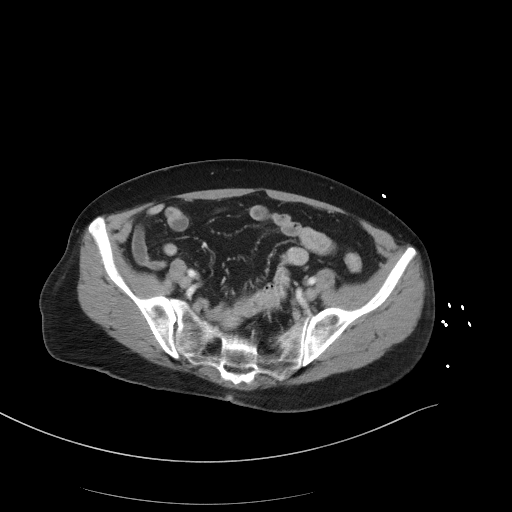
[im 46/91  soft-tissue]
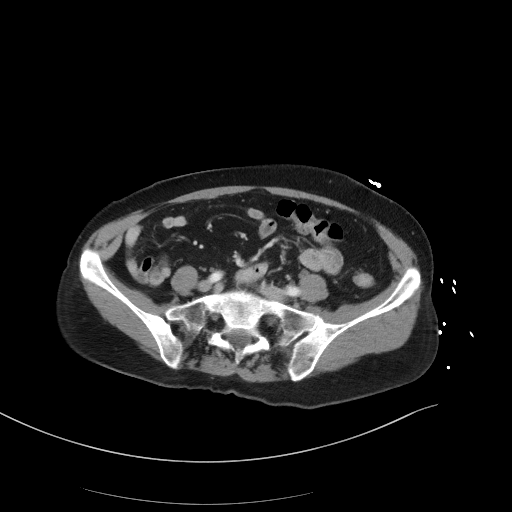
[im 51/91  soft-tissue]
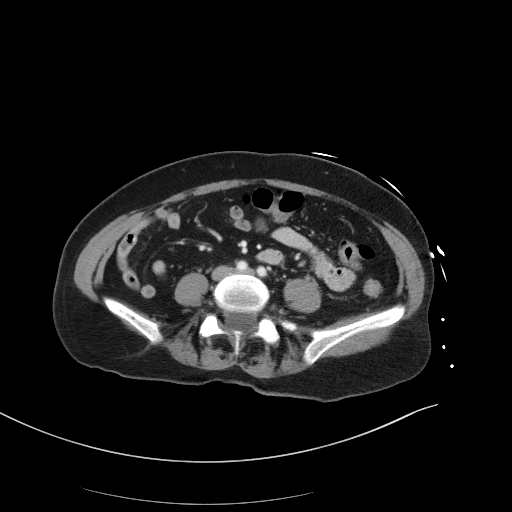
[im 61/91  soft-tissue]
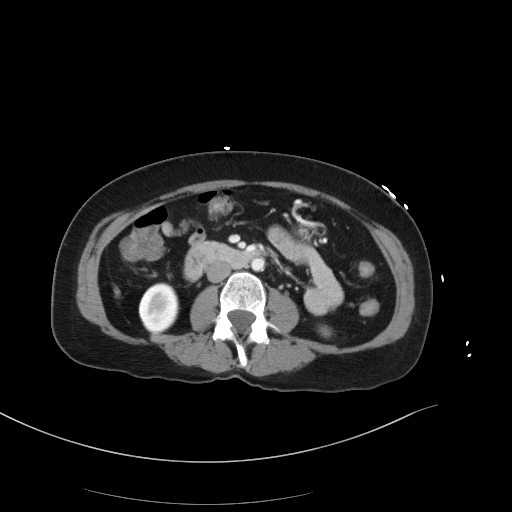
[im 61/91  bone]
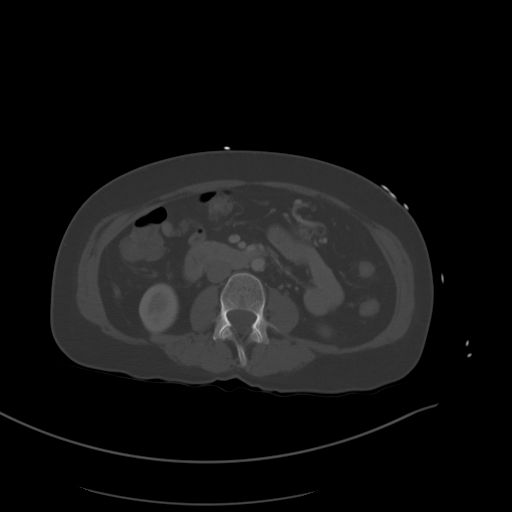
[im 66/91  soft-tissue]
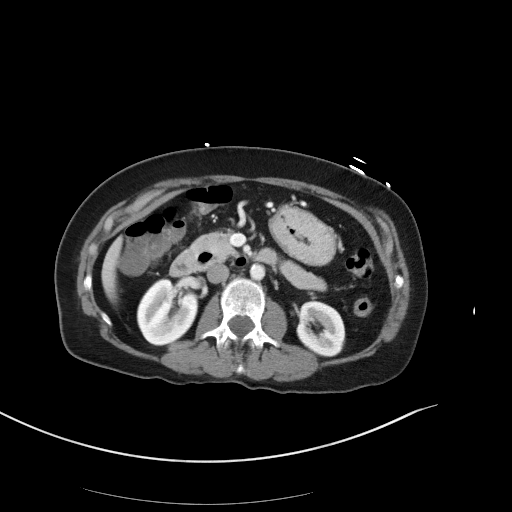
[im 71/91  soft-tissue]
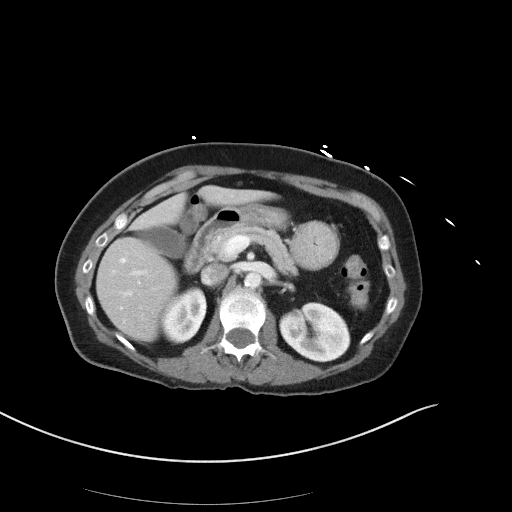
[im 81/91  soft-tissue]
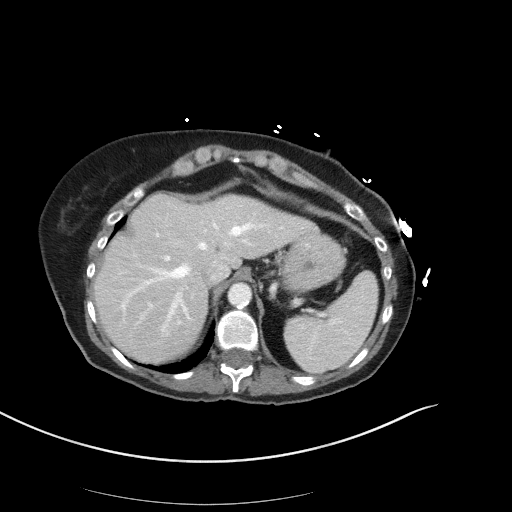
[im 86/91  soft-tissue]
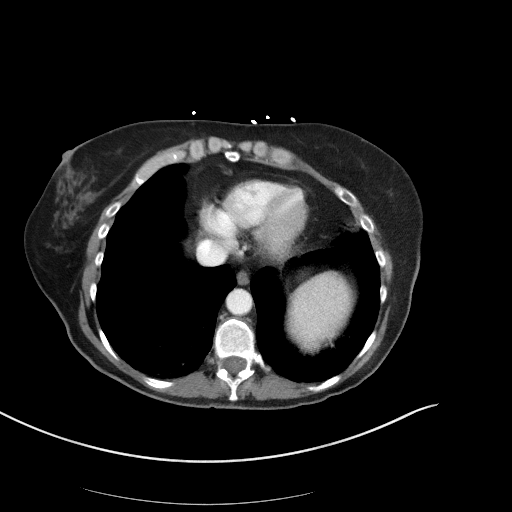

[Series 5: coronal st · coronal · 0.66mm/px · 3 of 87 slices shown]
[im 39/87  soft-tissue]
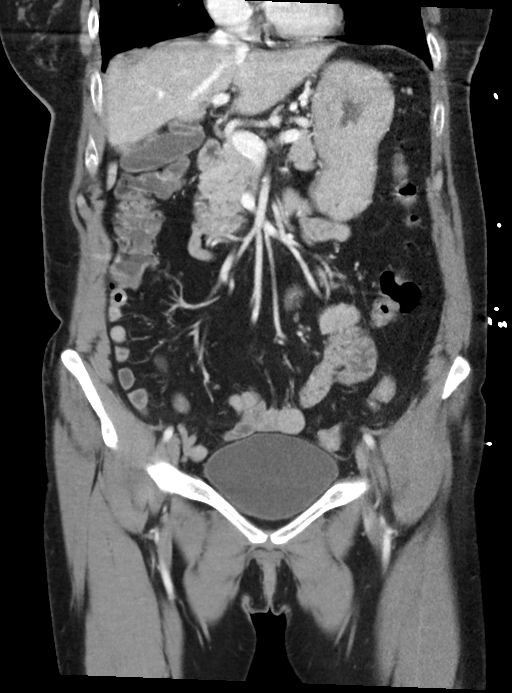
[im 48/87  soft-tissue]
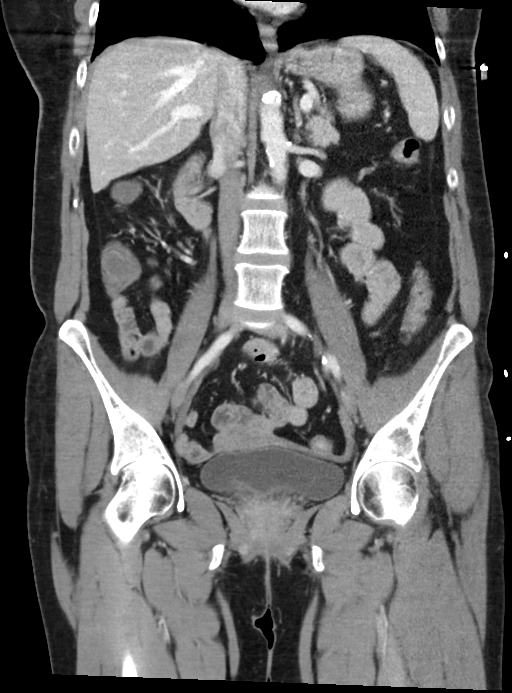
[im 58/87  soft-tissue]
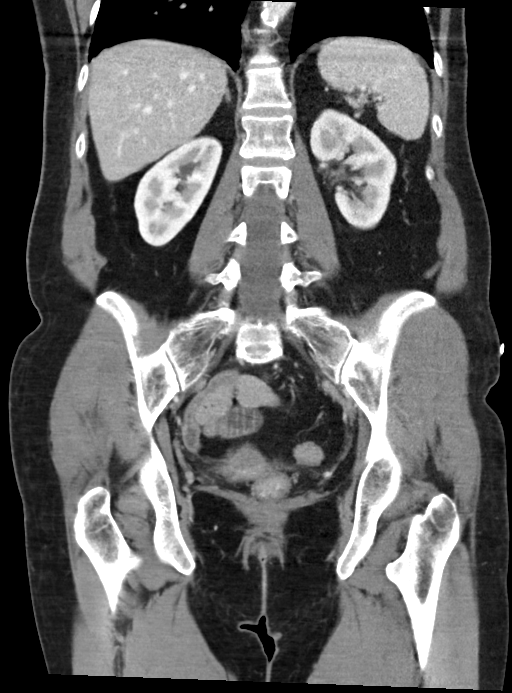

[16 of 46 positions shown; findings below may reference images not displayed]

FINDINGS: Lower chest: Dependent changes noted within the posterior lung
bases. No acute abnormality.

Hepatobiliary: No focal liver abnormality is seen. No gallstones,
gallbladder wall thickening, or biliary dilatation.

Pancreas: Unremarkable. No pancreatic ductal dilatation or
surrounding inflammatory changes.

Spleen: Normal in size without focal abnormality.

Adrenals/Urinary Tract: Adrenal glands are unremarkable. Kidneys are
normal, without renal calculi, focal lesion, or hydronephrosis.
Bladder is unremarkable.

Stomach/Bowel: There is mild wall thickening involving the proximal
and mid stomach. This is nonspecific and may reflect incomplete
distention. No discrete mass noted.

Vascular/Lymphatic: The appendix is visualized and has a normal
caliber measuring 5 mm in maximum diameter. No bowel wall
thickening, inflammation or distension.

Reproductive: Uterus and bilateral adnexa are unremarkable.

Other: No free fluid or fluid collections identified. Small fat
containing umbilical hernia.

Musculoskeletal: No acute or significant osseous findings.
IMPRESSION: 1. No acute findings identified within the abdomen or pelvis.
2. Mild wall thickening involving the proximal and mid stomach is
nonspecific and likely reflects incomplete distention. Correlate for
any clinical signs or symptoms of gastritis.

## 2020-09-05 MED ORDER — SODIUM CHLORIDE 0.9 % IV BOLUS
500.0000 mL | Freq: Once | INTRAVENOUS | Status: AC
Start: 2020-09-05 — End: 2020-09-05
  Administered 2020-09-05: 500 mL via INTRAVENOUS

## 2020-09-05 MED ORDER — IOHEXOL 350 MG/ML SOLN
60.0000 mL | Freq: Once | INTRAVENOUS | Status: AC | PRN
  Administered 2020-09-05: 60 mL via INTRAVENOUS

## 2020-09-05 MED ORDER — SUCRALFATE 1 G PO TABS: 1.0000 g | ORAL_TABLET | Freq: Three times a day (TID) | ORAL | 0 refills | Status: DC

## 2020-09-05 NOTE — ED Notes (Signed)
Checked on patient due to respiratory rate.  Patient is breathing 20 times per minute not 24.

## 2020-09-05 NOTE — Discharge Instructions (Addendum)
Take the Carafate  Return for new or worsening symptoms

## 2020-09-05 NOTE — ED Triage Notes (Signed)
Pt presents to ED with complaints of generalized abdominal pain and constipation. Pt states this has been on going for a couple of weeks. She went to her PCP and they had her talk miralax but she still feels constipated. Pt states she has also had heartburn continuously. Pt with hx gastroparesis.

## 2020-09-05 NOTE — ED Provider Notes (Signed)
[facility] EMERGENCY DEPARTMENT Provider Note   CSN: [id number] Arrival date & time: 09/05/20  1118     History Chief Complaint  Patient presents with   Abdominal Pain    Ruth Terry is a 65 y.o. female with past medical history significant for IBS-C, MVP, chronic pancreatic insufficiency who presents for evaluation of abdominal pain and constipation. States this has been an ongoing issue however worse in the last week.  Has intermittently tried taking MiraLAX or does not feel like she is completely "emptying out."  Had a small bowel movement this morning without melena or BRBPR. She is passing flatulence. She thinks she has a bowel obstruction however denies prior hx of same. She also states she has chronic epigastric pain. Takes meds prescribed by GI without relief. States chronic indigestion. No CP, Sob, back pain, paresthesias, weakness, HA, urinary complaints, fever, emesis. Pain located primarily to diffuse upper abdomen. Denies additional aggravating or alleviating factors. She is due for a colonoscopy  History obtained from patient and past medical records.  No interpreter used.  Followed by [facility], [name] Easterwood >> Per last note from proxy 1 month ago patient with chronic abdominal pain, chronic GERD, prior diagnosis of SIBO recently completed antibiotics for this seems like she has chronic incomplete bowel evacuation, per note thought stressors contributing to a lot of her GI symptoms, plans to have EGD and colonoscopy in September or October  HPI     Past Medical History:  Diagnosis Date   Anal fissure    Chronic constipation    Diverticulosis 04-20-10   colonoscopy   Gastroparesis    GERD (gastroesophageal reflux disease)    Hemorrhoids    Hyperlipidemia    IBS (irritable bowel syndrome)    MVP (mitral valve prolapse)    Polyp, stomach 04-20-10   egd   Shingles    UTI (lower urinary tract infection)     Patient Active Problem List   Diagnosis Date Noted    History of shingles 04/24/2019   Chronic midline low back pain without sciatica 02/27/2019   Chronic idiopathic constipation 04/26/2018   Aortic atherosclerosis ([place]) 03/01/2017   Rib fracture 12/07/2016   Gas pain 03/09/2016   Anal itching 03/09/2016   Elevated LDL cholesterol level 10/24/2015   Internal hemorrhoids 05/14/2014   GERD (gastroesophageal reflux disease) 09/17/2011   IBS (irritable bowel syndrome) 08/25/2010   Gastroparesis 06/30/2010   Non-ulcer dyspepsia 04/03/2010    Past Surgical History:  Procedure Laterality Date   COLONOSCOPY     ESOPHAGOGASTRODUODENOSCOPY       OB History     Gravida  0   Para  0   Term  0   Preterm  0   AB  0   Living  0      SAB  0   IAB  0   Ectopic  0   Multiple  0   Live Births  0           Family History  Problem Relation Age of Onset   Hypertension Mother    Diabetes Father    Heart attack Father        Age 19    Social History   Tobacco Use   Smoking status: Former    Packs/day: 0.50    Years: 6.00    Pack years: 3.00    Types: Cigarettes   Smokeless tobacco: Never   Tobacco comments:    USE SMOKELESS CIGARETTES  Vaping Use  Vaping Use: Every day   Substances: Nicotine  Substance Use Topics   Alcohol use: Yes    Alcohol/week: 0.0 standard drinks    Comment: 1 a week, wine   Drug use: No    Home Medications Prior to Admission medications   Medication Sig Start Date End Date Taking? Authorizing Provider  Cholecalciferol (VITAMIN D-3) 25 MCG (1000 UT) CAPS Take 1 capsule by mouth daily.   Yes [provider]  DEXILANT 60 MG capsule Take 1 capsule (60 mg total) by mouth daily. Symptoms refractory to Prevacid and Omeprazole.  New PA needed for dexilant. May need to submit as brand 03/11/20  Yes Gottschalk, Ashly M, DO  DULoxetine (CYMBALTA) 60 MG capsule Take 1 capsule (60 mg total) by mouth daily. 08/22/20  Yes Gottschalk, Ashly M, DO  ibandronate (BONIVA) 150 MG tablet Take 1  tablet (150 mg total) by mouth every 30 (thirty) days. Take in the morning with a full glass of water, on an empty stomach, and do not take anything else by mouth or lie down for the next 30 min. 12/11/19  Yes Gottschalk, Leatrice Jewels M, DO  levocetirizine (XYZAL) 5 MG tablet Take 1 tablet (5 mg total) by mouth every evening. For allergies 08/22/20  Yes Ronnie Doss M, DO  linaclotide (LINZESS) 145 MCG CAPS capsule Take 1 capsule (145 mcg total) by mouth daily before breakfast. 02/11/20  Yes Gottschalk, Ashly M, DO  Pancrelipase, Lip-Prot-Amyl, (ZENPEP) 25000-79000 units CPEP Take 2 capsules by mouth 3 (three) times daily with meals. Patient taking differently: Take 1 capsule by mouth 2 (two) times daily with a meal. 07/31/20 09/05/20 Yes Esterwood, [name] S, PA-C  Probiotic Product (ALIGN) 4 MG CAPS Take 1 capsule by mouth daily.   Yes [provider]  psyllium (METAMUCIL) 58.6 % packet Take 1 packet by mouth daily.   Yes [provider]  Simethicone (PHAZYME MAXIMUM STRENGTH) 250 MG CAPS Take 250 mg by mouth 3 (three) times daily. Patient taking differently: Take 250 mg by mouth 3 (three) times daily as needed (gas relief). 02/20/16  Yes Terald Sleeper, PA-C  sucralfate (CARAFATE) 1 g tablet Take 1 tablet (1 g total) by mouth 4 (four) times daily -  with meals and at bedtime for 14 days. 09/05/20 09/19/20 Yes Burnette Sautter A, PA-C  triamcinolone (NASACORT) 55 MCG/ACT AERO nasal inhaler Place 2 sprays into the nose daily. 07/14/20  Yes Gottschalk, Ashly M, DO  valACYclovir (VALTREX) 1000 MG tablet TAKE 1 TABLET (1,000 MG TOTAL) BY MOUTH AT BEDTIME. 07/21/20  Yes Hassell Done, Mary-Margaret, FNP  AMBULATORY NON FORMULARY MEDICATION Medication Name: Domperidone 10 mg Take 1 tablet at bedtime daily. Patient not taking: No sig reported 03/19/20   Esterwood, [name] S, PA-C  hydrocortisone (ANUSOL-HC) 25 MG suppository Use 1 suppository rectally at bedtime for 7 days then as needed Patient not taking: No sig  reported 07/31/20   Esterwood, [name] S, PA-C  hydrocortisone cream 1 % Apply 1 application topically 2 (two) times daily. Patient not taking: No sig reported 03/27/20   Gwenlyn Perking, FNP  metoCLOPramide (REGLAN) 5 MG tablet Take 1 tablet (5 mg total) by mouth 3 (three) times daily before meals. Patient not taking: No sig reported 07/31/20   Esterwood, [name] S, PA-C  nystatin (MYCOSTATIN) 100000 UNIT/ML suspension Take 5 mLs (500,000 Units total) by mouth 4 (four) times daily. Patient not taking: No sig reported 08/15/20   Baruch Gouty, FNP  Plecanatide (TRULANCE) 3 MG TABS Take 1  tablet by mouth daily. Patient not taking: No sig reported 08/22/20   [name], DO  Rectal Protectant-Emollient (CALMOL-4) 76-10 % SUPP Place 1 suppository rectally as needed (for hemmorrhoids). Patient not taking: No sig reported 03/27/20   Gwenlyn Perking, FNP  AMBULATORY NON FORMULARY MEDICATION Medication Name: Domperidone 10 mg Take 1 tablet at bedtime daily. Patient taking differently: No sig reported 09/08/16 05/15/19  Esterwood, [name] S, PA-C    Allergies    Chlordiazepoxide-clidinium, Ciprofloxacin, Flagyl [metronidazole], Sulfa drugs cross reactors, Cefdinir, and Nitrofurantoin monohyd macro  Review of Systems   Review of Systems  Constitutional: Negative.   HENT: Negative.    Respiratory: Negative.    Cardiovascular: Negative.   Gastrointestinal:  Positive for abdominal pain and constipation. Negative for abdominal distention, anal bleeding, blood in stool, diarrhea, nausea, rectal pain and vomiting.  Genitourinary: Negative.   Musculoskeletal: Negative.   Skin: Negative.   Neurological: Negative.   All other systems reviewed and are negative.  Physical Exam Updated Vital Signs BP 112/86   Pulse 61   Temp 97.7 F (36.5 C) (Oral)   Resp (!) 24   Ht 5' (1.524 m)   Wt 49 kg   SpO2 100%   BMI 21.09 kg/m   Physical Exam Vitals and nursing note reviewed.  Constitutional:       General: She is not in acute distress.    Appearance: She is well-developed. She is not ill-appearing.  HENT:     Head: Atraumatic.  Eyes:     Pupils: Pupils are equal, round, and reactive to light.  Cardiovascular:     Rate and Rhythm: Normal rate.     Heart sounds: Normal heart sounds.  Pulmonary:     Effort: Pulmonary effort is normal. No respiratory distress.     Breath sounds: Normal breath sounds.  Abdominal:     General: Bowel sounds are normal. There is no distension.     Palpations: Abdomen is soft.     Tenderness: There is generalized abdominal tenderness. There is no guarding or rebound. Negative signs include Murphy's sign and McBurney's sign.     Hernia: No hernia is present.  Musculoskeletal:        General: Normal range of motion.     Cervical back: Normal range of motion.  Skin:    General: Skin is warm and dry.  Neurological:     General: No focal deficit present.     Mental Status: She is alert.  Psychiatric:        Mood and Affect: Mood normal.    ED Results / Procedures / Treatments   Labs (all labs ordered are listed, but only abnormal results are displayed) Labs Reviewed  COMPREHENSIVE METABOLIC PANEL - Abnormal; Notable for the following components:      Result Value   Potassium 3.3 (*)    BUN 7 (*)    All other components within normal limits  URINALYSIS, ROUTINE W REFLEX MICROSCOPIC - Abnormal; Notable for the following components:   Specific Gravity, Urine <1.005 (*)    All other components within normal limits  CBC WITH DIFFERENTIAL/PLATELET  LIPASE, BLOOD    EKG EKG Interpretation  Date/Time:  Friday September 05 2020 12:06:12 EDT Ventricular Rate:  89 PR Interval:  134 QRS Duration: 82 QT Interval:  358 QTC Calculation: 436 R Axis:   44 Text Interpretation: Sinus rhythm Abnormal R-wave progression, early transition Confirmed by Thamas Jaegers ([id number]) on 09/05/2020 3:04:14 PM  Radiology CT Abdomen Pelvis  W Contrast  Result Date:  09/05/2020 CLINICAL DATA:  Abdominal pain, nausea and constipation. EXAM: CT ABDOMEN AND PELVIS WITH CONTRAST TECHNIQUE: Multidetector CT imaging of the abdomen and pelvis was performed using the standard protocol following bolus administration of intravenous contrast. CONTRAST:  33m OMNIPAQUE IOHEXOL 350 MG/ML SOLN COMPARISON:  04/29/2020 FINDINGS: Lower chest: Dependent changes noted within the posterior lung bases. No acute abnormality. Hepatobiliary: No focal liver abnormality is seen. No gallstones, gallbladder wall thickening, or biliary dilatation. Pancreas: Unremarkable. No pancreatic ductal dilatation or surrounding inflammatory changes. Spleen: Normal in size without focal abnormality. Adrenals/Urinary Tract: Adrenal glands are unremarkable. Kidneys are normal, without renal calculi, focal lesion, or hydronephrosis. Bladder is unremarkable. Stomach/Bowel: There is mild wall thickening involving the proximal and mid stomach. This is nonspecific and may reflect incomplete distention. No discrete mass noted. Vascular/Lymphatic: The appendix is visualized and has a normal caliber measuring 5 mm in maximum diameter. No bowel wall thickening, inflammation or distension. Reproductive: Uterus and bilateral adnexa are unremarkable. Other: No free fluid or fluid collections identified. Small fat containing umbilical hernia. Musculoskeletal: No acute or significant osseous findings. IMPRESSION: 1. No acute findings identified within the abdomen or pelvis. 2. Mild wall thickening involving the proximal and mid stomach is nonspecific and likely reflects incomplete distention. Correlate for any clinical signs or symptoms of gastritis. Electronically Signed   By: TKerby MoorsM.D.   On: 09/05/2020 14:47    Procedures Procedures   Medications Ordered in ED Medications  sodium chloride 0.9 % bolus 500 mL (0 mLs Intravenous Stopped 09/05/20 1356)  iohexol (OMNIPAQUE) 350 MG/ML injection 60 mL (60 mLs Intravenous  Contrast Given 09/05/20 1405)    ED Course  I have reviewed the triage vital signs and the nursing notes.  Pertinent labs & imaging results that were available during my care of the patient were reviewed by me and considered in my medical decision making (see chart for details).  Here for evaluation of abdominal pain.  Has history of chronic abdominal pain and constipation.  She is afebrile, nonseptic, not ill-appearing.  Generalized abdominal pain however worse to upper abdomen.  No focal pain.  She feels constipated.  No emesis.  No melena, bright blood per rectum.  Plan on labs, imaging and reassess  Labs and imaging personally reviewed and interpreted:  CBC with leukocytosis, Metabolic panel with potassium 3.3, no additional electrolyte, renal abnormality Lipase 33 UA negative for infection CT scan with possible gastritis  Patient reassessed.  Comfortable in room.  Reassuring work-up.  Likely acute on chronic issues.  States she has done well previously with Carafate.  Will write for additional Rx, stress importance of following up with GI.  Patient is nontoxic, nonseptic appearing, in no apparent distress.  Patient's pain and other symptoms adequately managed in emergency department.  Fluid bolus given.  Labs, imaging and vitals reviewed.  Patient does not meet the SIRS or Sepsis criteria.  On repeat exam patient does not have a surgical abdomin and there are no peritoneal signs.  No indication of appendicitis, bowel obstruction, bowel perforation, cholecystitis, diverticulitis, PID or ectopic pregnancy.   The patient has been appropriately medically screened and/or stabilized in the ED. I have low suspicion for any other emergent medical condition which would require further screening, evaluation or treatment in the ED or require inpatient management.  Patient is hemodynamically stable and in no acute distress.  Patient able to ambulate in department prior to ED.  Evaluation does not show  acute  pathology that would require ongoing or additional emergent interventions while in the emergency department or further inpatient treatment.  I have discussed the diagnosis with the patient and answered all questions.  Pain is been managed while in the emergency department and patient has no further complaints prior to discharge.  Patient is comfortable with plan discussed in room and is stable for discharge at this time.  I have discussed strict return precautions for returning to the emergency department.  Patient was encouraged to follow-up with PCP/specialist refer to at discharge.      MDM Rules/Calculators/A&P                            Final Clinical Impression(s) / ED Diagnoses Final diagnoses:  Generalized abdominal pain  Constipation, unspecified constipation type    Rx / DC Orders ED Discharge Orders          Ordered    sucralfate (CARAFATE) 1 g tablet  3 times daily with meals & bedtime        09/05/20 1525             Yelina Sarratt A, PA-C 09/05/20 1525    Luna Fuse, MD 09/08/20 0900

## 2020-09-09 ENCOUNTER — Telehealth: Payer: Self-pay | Admitting: Physician Assistant

## 2020-09-09 NOTE — Telephone Encounter (Signed)
Spoke with the patient. She went to the ED because her abdominal pain was very bad. States she was diagnosed with "gastritis" and given Carafate.  She was seen by her PCP about 3 weeks ago for stomach and back pain. A plain abd xray was done. She was told she has a lot of stool and nothing else. Complains of incomplete emptying. She started taking 1 dose of Miralax daily. She has taken Metamucil dose every evening for "years."  Patient is complaining of diarrhea  (2 watery stools today) and headache. She reports feeling weak and having stomach cramps. Denies taking anything else for her bowels. She feels certain the Carafate is causing the headache. She has not started Reglan yet. She finished Domperidone yesterday. She has not scheduled any future appointments. Asks that you review her recent imaging.

## 2020-09-09 NOTE — Telephone Encounter (Signed)
Patient called states she would like to speak with a nurse regarding ED visit over the weekend.

## 2020-09-10 ENCOUNTER — Telehealth: Payer: Self-pay | Admitting: Family Medicine

## 2020-09-10 ENCOUNTER — Other Ambulatory Visit: Payer: Self-pay | Admitting: Family Medicine

## 2020-09-10 DIAGNOSIS — E876 Hypokalemia: Secondary | ICD-10-CM

## 2020-09-10 MED ORDER — POTASSIUM CHLORIDE CRYS ER 20 MEQ PO TBCR: 20.0000 meq | EXTENDED_RELEASE_TABLET | Freq: Two times a day (BID) | ORAL | 0 refills | Status: DC

## 2020-09-10 NOTE — Telephone Encounter (Signed)
Discussed with the patient. All questions answered.

## 2020-09-10 NOTE — Telephone Encounter (Signed)
PT just wanted televisit to discuss Ct result. Appt made but her potassium was low and she felt like she is weak and about to pass out. Do you think it would be okay for her to start taking potassium otc and if so how much should she take?

## 2020-09-10 NOTE — Telephone Encounter (Signed)
Pt aware of gotts recommendations

## 2020-09-10 NOTE — Telephone Encounter (Signed)
CT didn't really show any findings that explained her symptoms.  Possible gastritis which could be GI infection or GERD.  Yes to OTC potassium.  Recommend followup with GI since this is a chronic GI issue.

## 2020-09-12 ENCOUNTER — Ambulatory Visit (INDEPENDENT_AMBULATORY_CARE_PROVIDER_SITE_OTHER): Payer: 59 | Admitting: Family Medicine

## 2020-09-12 DIAGNOSIS — K2900 Acute gastritis without bleeding: Secondary | ICD-10-CM | POA: Diagnosis not present

## 2020-09-12 DIAGNOSIS — E876 Hypokalemia: Secondary | ICD-10-CM | POA: Diagnosis not present

## 2020-09-12 DIAGNOSIS — K429 Umbilical hernia without obstruction or gangrene: Secondary | ICD-10-CM

## 2020-09-12 DIAGNOSIS — J310 Chronic rhinitis: Secondary | ICD-10-CM

## 2020-09-12 DIAGNOSIS — J329 Chronic sinusitis, unspecified: Secondary | ICD-10-CM

## 2020-09-12 MED ORDER — TRIAMCINOLONE ACETONIDE 55 MCG/ACT NA AERO: 2.0000 | INHALATION_SPRAY | Freq: Every day | NASAL | 3 refills | Status: DC

## 2020-09-12 NOTE — Progress Notes (Signed)
Telephone visit  Subjective: CC: f/u ER  PCP: [name], DO [id number] Ruth Terry is a 65 y.o. female calls for telephone consult today. Patient provides verbal consent for consult held via phone.  Due to COVID-19 pandemic this visit was conducted virtually. This visit type was conducted due to national recommendations for restrictions regarding the COVID-19 Pandemic (e.g. social distancing, sheltering in place) in an effort to limit this patient's exposure and mitigate transmission in our community. All issues noted in this document were discussed and addressed.  A physical exam was not performed with this format.   Location of patient: work Location of provider: [facility] Others present for call: none  1.  ER follow-up Patient was seen in the ER for extreme abdominal pain with associated nausea and vomiting.  She is found to be hypokalemic to 3.3.  She was placed on oral supplementation on the outpatient setting by myself.  She has been compliant with this.  She has not had any recurrent vomiting.  She is compliant with the Carafate, Pepcid as prescribed.  She did reach out to gastroenterology and they agreed with continuing current regimen.  If symptoms do not resolve after completion they were going to see her in the office there.  She was worried about some findings on CAT scan where there was a hernia did noted.  She also worried about what dependent pulmonary changes meant.  She reports no vomiting today.  Still having some discomfort but not to the extent of which that brought her to the ER.  Needs her Nasacort renewed and asked that this be sent to [facility] if possible.   ROS: Per HPI  Allergies  Allergen Reactions   Chlordiazepoxide-Clidinium Itching   Ciprofloxacin Itching    Irritates stomach   Flagyl [Metronidazole]     Severe yeast infection   Sulfa Drugs Cross Reactors Nausea Only   Cefdinir Nausea And Vomiting   Nitrofurantoin Monohyd Macro Rash   Past Medical  History:  Diagnosis Date   Anal fissure    Chronic constipation    Diverticulosis 04-20-10   colonoscopy   Gastroparesis    GERD (gastroesophageal reflux disease)    Hemorrhoids    Hyperlipidemia    IBS (irritable bowel syndrome)    MVP (mitral valve prolapse)    Polyp, stomach 04-20-10   egd   Shingles    UTI (lower urinary tract infection)     Current Outpatient Medications:    AMBULATORY NON FORMULARY MEDICATION, Medication Name: Domperidone 10 mg Take 1 tablet at bedtime daily. (Patient not taking: No sig reported), Disp: 90 tablet, Rfl: 1   Cholecalciferol (VITAMIN D-3) 25 MCG (1000 UT) CAPS, Take 1 capsule by mouth daily., Disp: , Rfl:    DEXILANT 60 MG capsule, Take 1 capsule (60 mg total) by mouth daily. Symptoms refractory to Prevacid and Omeprazole.  New PA needed for dexilant. May need to submit as brand, Disp: 90 capsule, Rfl: 3   DULoxetine (CYMBALTA) 60 MG capsule, Take 1 capsule (60 mg total) by mouth daily., Disp: 90 capsule, Rfl: 3   hydrocortisone (ANUSOL-HC) 25 MG suppository, Use 1 suppository rectally at bedtime for 7 days then as needed (Patient not taking: No sig reported), Disp: 12 suppository, Rfl: 3   hydrocortisone cream 1 %, Apply 1 application topically 2 (two) times daily. (Patient not taking: No sig reported), Disp: 30 g, Rfl: 0   ibandronate (BONIVA) 150 MG tablet, Take 1 tablet (150 mg total) by mouth every 30 (thirty)  days. Take in the morning with a full glass of water, on an empty stomach, and do not take anything else by mouth or lie down for the next 30 min., Disp: 3 tablet, Rfl: 0   levocetirizine (XYZAL) 5 MG tablet, Take 1 tablet (5 mg total) by mouth every evening. For allergies, Disp: 90 tablet, Rfl: 3   linaclotide (LINZESS) 145 MCG CAPS capsule, Take 1 capsule (145 mcg total) by mouth daily before breakfast., Disp: 90 capsule, Rfl: 3   metoCLOPramide (REGLAN) 5 MG tablet, Take 1 tablet (5 mg total) by mouth 3 (three) times daily before meals.  (Patient not taking: No sig reported), Disp: 90 tablet, Rfl: 6   nystatin (MYCOSTATIN) 100000 UNIT/ML suspension, Take 5 mLs (500,000 Units total) by mouth 4 (four) times daily. (Patient not taking: No sig reported), Disp: 60 mL, Rfl: 0   Pancrelipase, Lip-Prot-Amyl, (ZENPEP) 25000-79000 units CPEP, Take 2 capsules by mouth 3 (three) times daily with meals. (Patient taking differently: Take 1 capsule by mouth 2 (two) times daily with a meal.), Disp: 180 capsule, Rfl: 3   Plecanatide (TRULANCE) 3 MG TABS, Take 1 tablet by mouth daily. (Patient not taking: No sig reported), Disp: 4 tablet, Rfl: 0   potassium chloride SA (KLOR-CON) 20 MEQ tablet, Take 1 tablet (20 mEq total) by mouth 2 (two) times daily for 3 days., Disp: 6 tablet, Rfl: 0   Probiotic Product (ALIGN) 4 MG CAPS, Take 1 capsule by mouth daily., Disp: , Rfl:    psyllium (METAMUCIL) 58.6 % packet, Take 1 packet by mouth daily., Disp: , Rfl:    Rectal Protectant-Emollient (CALMOL-4) 76-10 % SUPP, Place 1 suppository rectally as needed (for hemmorrhoids). (Patient not taking: No sig reported), Disp: 30 suppository, Rfl: 1   Simethicone (PHAZYME MAXIMUM STRENGTH) 250 MG CAPS, Take 250 mg by mouth 3 (three) times daily. (Patient taking differently: Take 250 mg by mouth 3 (three) times daily as needed (gas relief).), Disp: 14 capsule, Rfl:    sucralfate (CARAFATE) 1 g tablet, Take 1 tablet (1 g total) by mouth 4 (four) times daily -  with meals and at bedtime for 14 days., Disp: 56 tablet, Rfl: 0   triamcinolone (NASACORT) 55 MCG/ACT AERO nasal inhaler, Place 2 sprays into the nose daily., Disp: 1 each, Rfl: 12   valACYclovir (VALTREX) 1000 MG tablet, TAKE 1 TABLET (1,000 MG TOTAL) BY MOUTH AT BEDTIME., Disp: 90 tablet, Rfl: 1  Current Facility-Administered Medications:    methylPREDNISolone acetate (DEPO-MEDROL) injection 40 mg, 40 mg, Intramuscular, Once, Aneeka Bowden M, DO  Assessment/ Plan: 65 y.o. female   Other acute gastritis  without hemorrhage  Rhinosinusitis - Plan: triamcinolone (NASACORT) 55 MCG/ACT AERO nasal inhaler  Hypokalemia  Umbilical hernia without obstruction and without gangrene  No more nausea.  Continue carafate/ pepcid as prescribed.  Follow up with GI prn.  She is awaiting till completion of her current therapies before scheduling an appointment  Refill nasacort sent to [facility]. Working well  Has BMP in place for recheck potassium.  Hernia without concerning features on imaging study.  No bowel involvement.  Start time: 10:30am End time: 10:37am  Total time spent on patient care (including telephone call/ virtual visit): 7 minutes  [place], [facility] [phone]

## 2020-09-17 ENCOUNTER — Other Ambulatory Visit: Payer: 59

## 2020-09-17 ENCOUNTER — Other Ambulatory Visit: Payer: Self-pay

## 2020-09-17 DIAGNOSIS — E876 Hypokalemia: Secondary | ICD-10-CM

## 2020-09-18 ENCOUNTER — Telehealth: Payer: Self-pay | Admitting: Physician Assistant

## 2020-09-18 LAB — BASIC METABOLIC PANEL
BUN/Creatinine Ratio: 8 — ABNORMAL LOW (ref 12–28)
BUN: 5 mg/dL — ABNORMAL LOW (ref 8–27)
CO2: 23 mmol/L (ref 20–29)
Calcium: 9.5 mg/dL (ref 8.7–10.3)
Chloride: 102 mmol/L (ref 96–106)
Creatinine, Ser: 0.63 mg/dL (ref 0.57–1.00)
Glucose: 99 mg/dL (ref 65–99)
Potassium: 3.8 mmol/L (ref 3.5–5.2)
Sodium: 142 mmol/L (ref 134–144)
eGFR: 100 mL/min/{1.73_m2} (ref >59)

## 2020-09-18 NOTE — Telephone Encounter (Signed)
Returned call to patient, I have explained that [name] is covering the hospital next week and does not have any appts within the next week. Her next available is several weeks out. Pt states that she has been dealing with her hemorrhoids and has had a place on her tail bone for some time now but it has seemed to worsen in the last week. Pt's phone started to break up and I could not hear her clearly. I have attempted to call patient back several times and still not able to hear her. Pt is going to try and call us back.

## 2020-09-18 NOTE — Telephone Encounter (Signed)
Pt is requesting to see [name] asap. She states that she can feel a lump right above her tail bone. She is not sure if it is related to hemorrhoids. She also reports that gastritis has not improved. [name]'s first available appt in on 10/20/20, pt cannot wait that long.

## 2020-09-19 ENCOUNTER — Encounter: Payer: Self-pay | Admitting: Family Medicine

## 2020-09-19 ENCOUNTER — Other Ambulatory Visit: Payer: Self-pay

## 2020-09-19 ENCOUNTER — Ambulatory Visit (INDEPENDENT_AMBULATORY_CARE_PROVIDER_SITE_OTHER): Payer: 59 | Admitting: Family Medicine

## 2020-09-19 VITALS — BP 131/76 | HR 87 | Temp 97.5°F | Resp 20 | Ht 60.0 in | Wt 104.0 lb

## 2020-09-19 DIAGNOSIS — L0591 Pilonidal cyst without abscess: Secondary | ICD-10-CM

## 2020-09-19 NOTE — Progress Notes (Signed)
Subjective:  Patient ID: Ruth Terry, female    DOB: 11-04-1955, 65 y.o.   MRN: [id number]  Patient Care Team: Raliegh Ip, DO as PCP - General (Family Medicine) Danella Maiers, [facility] (Pharmacist) Peterson Ao as Physician Assistant (Gastroenterology) Eldred Manges, MD as Consulting Physician (Orthopedic Surgery)   Chief Complaint:  cyst area- buttock  (Possible hemorrhoid pain )   HPI: Ruth Terry is a 65 y.o. female presenting on 09/19/2020 for cyst area- buttock  (Possible hemorrhoid pain )   Pt presents today with complaints of pain and swelling to gluteal fold. States this has been there for a while but has become tender and painful over the last week. She is concerned it is her hemorrhoids. No rectal bleeding. No drainage from area. States her buttocks does hurt when walking due to the swelling.     Relevant past medical, surgical, family, and social history reviewed and updated as indicated.  Allergies and medications reviewed and updated. Data reviewed: Chart in Epic.   Past Medical History:  Diagnosis Date   Anal fissure    Chronic constipation    Diverticulosis 04-20-10   colonoscopy   Gastroparesis    GERD (gastroesophageal reflux disease)    Hemorrhoids    Hyperlipidemia    IBS (irritable bowel syndrome)    MVP (mitral valve prolapse)    Polyp, stomach 04-20-10   egd   Shingles    UTI (lower urinary tract infection)     Past Surgical History:  Procedure Laterality Date   COLONOSCOPY     ESOPHAGOGASTRODUODENOSCOPY      Social History   Socioeconomic History   Marital status: Married    Spouse name: Not on file   Number of children: 0   Years of education: Not on file   Highest education level: Not on file  Occupational History   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    Employer: [employer]  Tobacco Use   Smoking status: Former    Packs/day: 0.50    Years: 6.00    Pack years:  3.00    Types: Cigarettes   Smokeless tobacco: Never   Tobacco comments:    USE SMOKELESS CIGARETTES  Vaping Use   Vaping Use: Every day   Substances: Nicotine  Substance and Sexual Activity   Alcohol use: Yes    Alcohol/week: 0.0 standard drinks    Comment: 1 a week, wine   Drug use: No   Sexual activity: Not on file  Other Topics Concern   Not on file  Social History Narrative   Not on file   Social Determinants of Health   Financial Resource Strain: Not on file  Food Insecurity: Not on file  Transportation Needs: Not on file  Physical Activity: Not on file  Stress: Not on file  Social Connections: Not on file  Intimate Partner Violence: Not on file    Outpatient Encounter Medications as of 09/19/2020  Medication Sig   AMBULATORY NON FORMULARY MEDICATION Medication Name: Domperidone 10 mg Take 1 tablet at bedtime daily.   Cholecalciferol (VITAMIN D-3) 25 MCG (1000 UT) CAPS Take 1 capsule by mouth daily.   DEXILANT 60 MG capsule Take 1 capsule (60 mg total) by mouth daily. Symptoms refractory to Prevacid and Omeprazole.  New PA needed for dexilant. May need to submit as brand   DULoxetine (CYMBALTA) 60 MG capsule Take 1 capsule (60 mg total) by  mouth daily.   hydrocortisone (ANUSOL-HC) 25 MG suppository Use 1 suppository rectally at bedtime for 7 days then as needed   hydrocortisone cream 1 % Apply 1 application topically 2 (two) times daily.   ibandronate (BONIVA) 150 MG tablet Take 1 tablet (150 mg total) by mouth every 30 (thirty) days. Take in the morning with a full glass of water, on an empty stomach, and do not take anything else by mouth or lie down for the next 30 min.   levocetirizine (XYZAL) 5 MG tablet Take 1 tablet (5 mg total) by mouth every evening. For allergies   linaclotide (LINZESS) 145 MCG CAPS capsule Take 1 capsule (145 mcg total) by mouth daily before breakfast.   metoCLOPramide (REGLAN) 5 MG tablet Take 1 tablet (5 mg total) by mouth 3 (three) times  daily before meals.   nystatin (MYCOSTATIN) 100000 UNIT/ML suspension Take 5 mLs (500,000 Units total) by mouth 4 (four) times daily.   Plecanatide (TRULANCE) 3 MG TABS Take 1 tablet by mouth daily.   Probiotic Product (ALIGN) 4 MG CAPS Take 1 capsule by mouth daily.   psyllium (METAMUCIL) 58.6 % packet Take 1 packet by mouth daily.   Rectal Protectant-Emollient (CALMOL-4) 76-10 % SUPP Place 1 suppository rectally as needed (for hemmorrhoids).   Simethicone (PHAZYME MAXIMUM STRENGTH) 250 MG CAPS Take 250 mg by mouth 3 (three) times daily. (Patient taking differently: Take 250 mg by mouth 3 (three) times daily as needed (gas relief).)   sucralfate (CARAFATE) 1 g tablet Take 1 tablet (1 g total) by mouth 4 (four) times daily -  with meals and at bedtime for 14 days.   triamcinolone (NASACORT) 55 MCG/ACT AERO nasal inhaler Place 2 sprays into the nose daily.   valACYclovir (VALTREX) 1000 MG tablet TAKE 1 TABLET (1,000 MG TOTAL) BY MOUTH AT BEDTIME.   [DISCONTINUED] AMBULATORY NON FORMULARY MEDICATION Medication Name: Domperidone 10 mg Take 1 tablet at bedtime daily. (Patient taking differently: Take 1 tablet by mouth at bedtime. Medication Name: Domperidone 10 mg Take 1 tablet at bedtime daily.)   Pancrelipase, Lip-Prot-Amyl, (ZENPEP) 25000-79000 units CPEP Take 2 capsules by mouth 3 (three) times daily with meals. (Patient taking differently: Take 1 capsule by mouth 2 (two) times daily with a meal.)   potassium chloride SA (KLOR-CON) 20 MEQ tablet Take 1 tablet (20 mEq total) by mouth 2 (two) times daily for 3 days.   Facility-Administered Encounter Medications as of 09/19/2020  Medication   methylPREDNISolone acetate (DEPO-MEDROL) injection 40 mg    Allergies  Allergen Reactions   Chlordiazepoxide-Clidinium Itching   Ciprofloxacin Itching    Irritates stomach   Flagyl [Metronidazole]     Severe yeast infection   Sulfa Drugs Cross Reactors Nausea Only   Cefdinir Nausea And Vomiting    Nitrofurantoin Monohyd Macro Rash    Review of Systems  Constitutional:  Negative for activity change, appetite change, chills, diaphoresis, fatigue, fever and unexpected weight change.  HENT: Negative.    Eyes: Negative.   Respiratory:  Negative for cough, chest tightness and shortness of breath.   Cardiovascular:  Negative for chest pain, palpitations and leg swelling.  Gastrointestinal:  Negative for abdominal pain, blood in stool, constipation, diarrhea, nausea and vomiting.  Endocrine: Negative.   Genitourinary:  Negative for dysuria, frequency and urgency.  Musculoskeletal:  Negative for arthralgias and myalgias.  Skin:  Positive for wound.  Allergic/Immunologic: Negative.   Neurological:  Negative for dizziness, weakness and headaches.  Hematological: Negative.   Psychiatric/Behavioral:  Negative for confusion, hallucinations, sleep disturbance and suicidal ideas.   All other systems reviewed and are negative.      Objective:  BP 131/76   Pulse 87   Temp (!) 97.5 F (36.4 C)   Resp 20   Ht 5' (1.524 m)   Wt 104 lb (47.2 kg)   SpO2 99%   BMI 20.31 kg/m    Wt Readings from Last 3 Encounters:  09/19/20 104 lb (47.2 kg)  09/05/20 108 lb (49 kg)  08/28/20 106 lb 12.8 oz (48.4 kg)    Physical Exam Vitals and nursing note reviewed.  Constitutional:      General: She is not in acute distress.    Appearance: Normal appearance. She is well-developed, well-groomed and normal weight. She is not ill-appearing, toxic-appearing or diaphoretic.  HENT:     Head: Normocephalic and atraumatic.     Jaw: There is normal jaw occlusion.     Right Ear: Hearing normal.     Left Ear: Hearing normal.     Nose: Nose normal.     Mouth/Throat:     Lips: Pink.     Mouth: Mucous membranes are moist.     Pharynx: Oropharynx is clear. Uvula midline.  Eyes:     General: Lids are normal.     Extraocular Movements: Extraocular movements intact.     Conjunctiva/sclera: Conjunctivae normal.      Pupils: Pupils are equal, round, and reactive to light.  Neck:     Thyroid: No thyroid mass, thyromegaly or thyroid tenderness.     Vascular: No carotid bruit or JVD.     Trachea: Trachea and phonation normal.  Cardiovascular:     Rate and Rhythm: Normal rate and regular rhythm.     Chest Wall: PMI is not displaced.     Heart sounds: No murmur heard.   No friction rub. No gallop.  Pulmonary:     Effort: Pulmonary effort is normal. No respiratory distress.     Breath sounds: No wheezing.  Abdominal:     General: Bowel sounds are normal. There is no distension or abdominal bruit.     Palpations: Abdomen is soft. There is no hepatomegaly or splenomegaly.     Tenderness: There is no abdominal tenderness. There is no right CVA tenderness or left CVA tenderness.     Hernia: No hernia is present. There is no hernia in the left inguinal area or right inguinal area.  Genitourinary:    Exam position: Knee-chest position.     Rectum: Normal.  Musculoskeletal:        General: Normal range of motion.     Cervical back: Normal range of motion and neck supple.     Right lower leg: No edema.     Left lower leg: No edema.  Lymphadenopathy:     Cervical: No cervical adenopathy.     Lower Body: No right inguinal adenopathy. No left inguinal adenopathy.  Skin:    General: Skin is warm and dry.     Capillary Refill: Capillary refill takes less than 2 seconds.     Coloration: Skin is not cyanotic, jaundiced or pale.     Findings: No rash.       Neurological:     General: No focal deficit present.     Mental Status: She is alert and oriented to person, place, and time.     Cranial Nerves: Cranial nerves are intact.     Sensory: Sensation is intact.  Motor: Motor function is intact.     Coordination: Coordination is intact.     Gait: Gait is intact.     Deep Tendon Reflexes: Reflexes are normal and symmetric.  Psychiatric:        Attention and Perception: Attention and perception  normal.        Mood and Affect: Mood and affect normal.        Speech: Speech normal.        Behavior: Behavior normal. Behavior is cooperative.        Thought Content: Thought content normal.        Cognition and Memory: Cognition and memory normal.        Judgment: Judgment normal.    Results for orders placed or performed in visit on 09/17/20  Basic metabolic panel  Result Value Ref Range   Glucose 99 65 - 99 mg/dL   BUN 5 (L) 8 - 27 mg/dL   Creatinine, Ser 1.61 0.57 - 1.00 mg/dL   eGFR 096 >04 VW/UJW/1.19   BUN/Creatinine Ratio 8 (L) 12 - 28   Sodium 142 134 - 144 mmol/L   Potassium 3.8 3.5 - 5.2 mmol/L   Chloride 102 96 - 106 mmol/L   CO2 23 20 - 29 mmol/L   Calcium 9.5 8.7 - 10.3 mg/dL       Pertinent labs & imaging results that were available during my care of the patient were reviewed by me and considered in my medical decision making.  Assessment & Plan:  Pat was seen today for cyst area- buttock .  Diagnoses and all orders for this visit:  Pilonidal cyst Forming pilonidal cyst. Not drained in office as it has just started to form. Will refer to general surgery for evaluation. Symptomatic care discussed in detail Pt aware to report any new or worsening symptoms.  -     Ambulatory referral to General Surgery     Continue all other maintenance medications.  Follow up plan: Return if symptoms worsen or fail to improve.   Continue healthy lifestyle choices, including diet (rich in fruits, vegetables, and lean proteins, and low in salt and simple carbohydrates) and exercise (at least 30 minutes of moderate physical activity daily).  Educational handout given for pilonidal cyst  The above assessment and management plan was discussed with the patient. The patient verbalized understanding of and has agreed to the management plan. Patient is aware to call the clinic if they develop any new symptoms or if symptoms persist or worsen. Patient is aware when to return  to the clinic for a follow-up visit. Patient educated on when it is appropriate to go to the emergency department.   Kari Baars, FNP-C [facility] [phone]

## 2020-09-25 ENCOUNTER — Telehealth: Payer: Self-pay | Admitting: Physician Assistant

## 2020-09-25 ENCOUNTER — Other Ambulatory Visit: Payer: Self-pay

## 2020-09-25 MED ORDER — HYOSCYAMINE SULFATE 0.125 MG SL SUBL: 0.1250 mg | SUBLINGUAL_TABLET | Freq: Four times a day (QID) | SUBLINGUAL | 0 refills | Status: DC | PRN

## 2020-09-25 NOTE — Telephone Encounter (Signed)
Inbound call from patient requesting a call back to discuss other options to help relieve gastrics. States gasx and pepcid is not working out. Says she be okay for a day then it goes to being the same. Best contact number [phone]

## 2020-09-25 NOTE — Telephone Encounter (Signed)
Called the patient. No answer. No voicemail.  Medication transmitted to [facility].

## 2020-09-25 NOTE — Telephone Encounter (Signed)
Patient calls with her complaints of "gastritis and stomach cramps." She stopped the Carafate because of headaches. Reports the headaches continued but did not resume the Carafate. She is having daily bowel movements. Eating bland and eats 2 meals a day. Some snacks during the day. She agrees to try the Carafate again. She plans to try it twice a day for a few days to see if she has any problems. She requests medication for her stomach cramps.

## 2020-09-26 ENCOUNTER — Other Ambulatory Visit: Payer: Self-pay | Admitting: *Deleted

## 2020-09-26 ENCOUNTER — Encounter: Payer: Self-pay | Admitting: Family Medicine

## 2020-09-26 ENCOUNTER — Ambulatory Visit (INDEPENDENT_AMBULATORY_CARE_PROVIDER_SITE_OTHER): Payer: 59 | Admitting: Family Medicine

## 2020-09-26 ENCOUNTER — Telehealth: Payer: Self-pay | Admitting: *Deleted

## 2020-09-26 DIAGNOSIS — J309 Allergic rhinitis, unspecified: Secondary | ICD-10-CM | POA: Diagnosis not present

## 2020-09-26 MED ORDER — DENOSUMAB 60 MG/ML ~~LOC~~ SOSY: 60.0000 mg | PREFILLED_SYRINGE | Freq: Once | SUBCUTANEOUS | 1 refills | Status: AC

## 2020-09-26 MED ORDER — MONTELUKAST SODIUM 10 MG PO TABS: 10.0000 mg | ORAL_TABLET | Freq: Every day | ORAL | 3 refills | Status: DC

## 2020-09-26 NOTE — Telephone Encounter (Signed)
Per Joycelyn Schmid, send RX to pharmacy and have patient pick up. Patient aware that Rx sent to pharmacy.

## 2020-09-26 NOTE — Progress Notes (Signed)
   Virtual Visit  Note Due to COVID-19 pandemic this visit was conducted virtually. This visit type was conducted due to national recommendations for restrictions regarding the COVID-19 Pandemic (e.g. social distancing, sheltering in place) in an effort to limit this patient's exposure and mitigate transmission in our community. All issues noted in this document were discussed and addressed.  A physical exam was not performed with this format.  I connected with Ruth Terry on 09/26/20 at 0957 by telephone and verified that I am speaking with the correct person using two identifiers. Ruth Terry is currently located at home and her husband is currently with her during the visit. The provider, Gwenlyn Perking, FNP is located in their office at time of visit.  I discussed the limitations, risks, security and privacy concerns of performing an evaluation and management service by telephone and the availability of in person appointments. I also discussed with the patient that there may be a patient responsible charge related to this service. The patient expressed understanding and agreed to proceed.  CC: sinus problem  History and Present Illness:  HPI [name] reports recurrent sinus problems since having Covid 7 months ago. She has been taking nasacort and xyzal daily for this. She reports an increase in symptoms that started yesterday. She reports runny nose, sinus drainage, nausea, and mild head congestion. She denies fever, cough, HA, vomiting, diarrhea, chills, chest pain, or shortness of breath. She had a negative home Covid test yesterday. She would like to know what else she can take for her symptoms.   ROS As per HPI.    Observations/Objective: Alert and oriented x 3. Able to speak in full sentences without difficulty.    Assessment and Plan: Ruth Terry was seen today for sinus problem.  Diagnoses and all orders for this visit:  Chronic allergic rhinitis Declined PCR Covid test  today. Add singular as below, continue nasacort and xyzal. Discussed nettipot and mucinex for congestion. Discussed return precautions.  -     montelukast (SINGULAIR) 10 MG tablet; Take 1 tablet (10 mg total) by mouth at bedtime.    Follow Up Instructions: As needed.     I discussed the assessment and treatment plan with the patient. The patient was provided an opportunity to ask questions and all were answered. The patient agreed with the plan and demonstrated an understanding of the instructions.   The patient was advised to call back or seek an in-person evaluation if the symptoms worsen or if the condition fails to improve as anticipated.  The above assessment and management plan was discussed with the patient. The patient verbalized understanding of and has agreed to the management plan. Patient is aware to call the clinic if symptoms persist or worsen. Patient is aware when to return to the clinic for a follow-up visit. Patient educated on when it is appropriate to go to the emergency department.   Time call ended:  1008  I provided 11 minutes of  non face-to-face time during this encounter.    Gwenlyn Perking, FNP

## 2020-09-26 NOTE — Telephone Encounter (Signed)
Prolia 60MG /ML syringes Required Alendronate Sodium Not Required Ibandronate Sodium Not Required Ibandronate Sodium Not Required Ibandronate Sodium Not Required Xgeva Not Required Zoledronic Acid Not Required Risedronate Sodium Required Teriparatide (Recombinant) Required Tymlos Required    In cover my meds

## 2020-10-02 ENCOUNTER — Other Ambulatory Visit: Payer: Self-pay

## 2020-10-02 ENCOUNTER — Ambulatory Visit (INDEPENDENT_AMBULATORY_CARE_PROVIDER_SITE_OTHER): Payer: 59 | Admitting: Family Medicine

## 2020-10-02 ENCOUNTER — Encounter: Payer: Self-pay | Admitting: Family Medicine

## 2020-10-02 VITALS — BP 140/82 | HR 103 | Ht 60.0 in | Wt 105.0 lb

## 2020-10-02 DIAGNOSIS — R21 Rash and other nonspecific skin eruption: Secondary | ICD-10-CM

## 2020-10-02 DIAGNOSIS — K644 Residual hemorrhoidal skin tags: Secondary | ICD-10-CM | POA: Diagnosis not present

## 2020-10-02 DIAGNOSIS — K648 Other hemorrhoids: Secondary | ICD-10-CM

## 2020-10-02 NOTE — Progress Notes (Signed)
Subjective:  Patient ID: Ruth Terry, female    DOB: 1955/08/02, 65 y.o.   MRN: [id number]  Patient Care Team: [name], DO as PCP - General (Family Medicine) Lavera Guise, [facility] (Pharmacist) Leotis Pain as Physician Assistant (Gastroenterology) Marybelle Killings, MD as Consulting Physician (Orthopedic Surgery)   Chief Complaint:  Herpes Zoster   HPI: Ruth Terry is a 64 y.o. female presenting on 10/02/2020 for Herpes Zoster   Pt presents today with complaints of possible shingles. States Sunday night she had a burning near her spine and noticed 2 red bumps. Denies fever, chills, weakness, or drainage from the lesions. States she would also like provider to reassess her hemorrhoids as they have been bothersome. No rectal bleeding but does have pain and pruritis.    Relevant past medical, surgical, family, and social history reviewed and updated as indicated.  Allergies and medications reviewed and updated. Data reviewed: Chart in Epic.   Past Medical History:  Diagnosis Date   Anal fissure    Chronic constipation    Diverticulosis 04-20-10   colonoscopy   Gastroparesis    GERD (gastroesophageal reflux disease)    Hemorrhoids    Hyperlipidemia    IBS (irritable bowel syndrome)    MVP (mitral valve prolapse)    Polyp, stomach 04-20-10   egd   Shingles    UTI (lower urinary tract infection)     Past Surgical History:  Procedure Laterality Date   COLONOSCOPY     ESOPHAGOGASTRODUODENOSCOPY      Social History   Socioeconomic History   Marital status: Married    Spouse name: Not on file   Number of children: 0   Years of education: Not on file   Highest education level: Not on file  Occupational History   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    Employer: [employer]  Tobacco Use   Smoking status: Former    Packs/day: 0.50    Years: 6.00    Pack years: 3.00    Types: Cigarettes   Smokeless  tobacco: Never   Tobacco comments:    USE SMOKELESS CIGARETTES  Vaping Use   Vaping Use: Every day   Substances: Nicotine  Substance and Sexual Activity   Alcohol use: Yes    Alcohol/week: 0.0 standard drinks    Comment: 1 a week, wine   Drug use: No   Sexual activity: Not on file  Other Topics Concern   Not on file  Social History Narrative   Not on file   Social Determinants of Health   Financial Resource Strain: Not on file  Food Insecurity: Not on file  Transportation Needs: Not on file  Physical Activity: Not on file  Stress: Not on file  Social Connections: Not on file  Intimate Partner Violence: Not on file    Outpatient Encounter Medications as of 10/02/2020  Medication Sig   Cholecalciferol (VITAMIN D-3) 25 MCG (1000 UT) CAPS Take 1 capsule by mouth daily.   DEXILANT 60 MG capsule Take 1 capsule (60 mg total) by mouth daily. Symptoms refractory to Prevacid and Omeprazole.  New PA needed for dexilant. May need to submit as brand   DULoxetine (CYMBALTA) 60 MG capsule Take 1 capsule (60 mg total) by mouth daily.   erythromycin ophthalmic ointment SMARTSIG:In Eye(s)   hydrocortisone cream 1 % Apply 1 application topically 2 (two) times daily.   hyoscyamine (LEVSIN SL) 0.125  Subjective:  Patient ID: Ruth Terry, female    DOB: 1955/08/02, 65 y.o.   MRN: [id number]  Patient Care Team: [name], DO as PCP - General (Family Medicine) Lavera Guise, [facility] (Pharmacist) Leotis Pain as Physician Assistant (Gastroenterology) Marybelle Killings, MD as Consulting Physician (Orthopedic Surgery)   Chief Complaint:  Herpes Zoster   HPI: Ruth Terry is a 64 y.o. female presenting on 10/02/2020 for Herpes Zoster   Pt presents today with complaints of possible shingles. States Sunday night she had a burning near her spine and noticed 2 red bumps. Denies fever, chills, weakness, or drainage from the lesions. States she would also like provider to reassess her hemorrhoids as they have been bothersome. No rectal bleeding but does have pain and pruritis.    Relevant past medical, surgical, family, and social history reviewed and updated as indicated.  Allergies and medications reviewed and updated. Data reviewed: Chart in Epic.   Past Medical History:  Diagnosis Date   Anal fissure    Chronic constipation    Diverticulosis 04-20-10   colonoscopy   Gastroparesis    GERD (gastroesophageal reflux disease)    Hemorrhoids    Hyperlipidemia    IBS (irritable bowel syndrome)    MVP (mitral valve prolapse)    Polyp, stomach 04-20-10   egd   Shingles    UTI (lower urinary tract infection)     Past Surgical History:  Procedure Laterality Date   COLONOSCOPY     ESOPHAGOGASTRODUODENOSCOPY      Social History   Socioeconomic History   Marital status: Married    Spouse name: Not on file   Number of children: 0   Years of education: Not on file   Highest education level: Not on file  Occupational History   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    Employer: [employer]  Tobacco Use   Smoking status: Former    Packs/day: 0.50    Years: 6.00    Pack years: 3.00    Types: Cigarettes   Smokeless  tobacco: Never   Tobacco comments:    USE SMOKELESS CIGARETTES  Vaping Use   Vaping Use: Every day   Substances: Nicotine  Substance and Sexual Activity   Alcohol use: Yes    Alcohol/week: 0.0 standard drinks    Comment: 1 a week, wine   Drug use: No   Sexual activity: Not on file  Other Topics Concern   Not on file  Social History Narrative   Not on file   Social Determinants of Health   Financial Resource Strain: Not on file  Food Insecurity: Not on file  Transportation Needs: Not on file  Physical Activity: Not on file  Stress: Not on file  Social Connections: Not on file  Intimate Partner Violence: Not on file    Outpatient Encounter Medications as of 10/02/2020  Medication Sig   Cholecalciferol (VITAMIN D-3) 25 MCG (1000 UT) CAPS Take 1 capsule by mouth daily.   DEXILANT 60 MG capsule Take 1 capsule (60 mg total) by mouth daily. Symptoms refractory to Prevacid and Omeprazole.  New PA needed for dexilant. May need to submit as brand   DULoxetine (CYMBALTA) 60 MG capsule Take 1 capsule (60 mg total) by mouth daily.   erythromycin ophthalmic ointment SMARTSIG:In Eye(s)   hydrocortisone cream 1 % Apply 1 application topically 2 (two) times daily.   hyoscyamine (LEVSIN SL) 0.125  Subjective:  Patient ID: Ruth Terry, female    DOB: 1955/08/02, 65 y.o.   MRN: [id number]  Patient Care Team: [name], DO as PCP - General (Family Medicine) Lavera Guise, [facility] (Pharmacist) Leotis Pain as Physician Assistant (Gastroenterology) Marybelle Killings, MD as Consulting Physician (Orthopedic Surgery)   Chief Complaint:  Herpes Zoster   HPI: Ruth Terry is a 64 y.o. female presenting on 10/02/2020 for Herpes Zoster   Pt presents today with complaints of possible shingles. States Sunday night she had a burning near her spine and noticed 2 red bumps. Denies fever, chills, weakness, or drainage from the lesions. States she would also like provider to reassess her hemorrhoids as they have been bothersome. No rectal bleeding but does have pain and pruritis.    Relevant past medical, surgical, family, and social history reviewed and updated as indicated.  Allergies and medications reviewed and updated. Data reviewed: Chart in Epic.   Past Medical History:  Diagnosis Date   Anal fissure    Chronic constipation    Diverticulosis 04-20-10   colonoscopy   Gastroparesis    GERD (gastroesophageal reflux disease)    Hemorrhoids    Hyperlipidemia    IBS (irritable bowel syndrome)    MVP (mitral valve prolapse)    Polyp, stomach 04-20-10   egd   Shingles    UTI (lower urinary tract infection)     Past Surgical History:  Procedure Laterality Date   COLONOSCOPY     ESOPHAGOGASTRODUODENOSCOPY      Social History   Socioeconomic History   Marital status: Married    Spouse name: Not on file   Number of children: 0   Years of education: Not on file   Highest education level: Not on file  Occupational History   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    Employer: [employer]  Tobacco Use   Smoking status: Former    Packs/day: 0.50    Years: 6.00    Pack years: 3.00    Types: Cigarettes   Smokeless  tobacco: Never   Tobacco comments:    USE SMOKELESS CIGARETTES  Vaping Use   Vaping Use: Every day   Substances: Nicotine  Substance and Sexual Activity   Alcohol use: Yes    Alcohol/week: 0.0 standard drinks    Comment: 1 a week, wine   Drug use: No   Sexual activity: Not on file  Other Topics Concern   Not on file  Social History Narrative   Not on file   Social Determinants of Health   Financial Resource Strain: Not on file  Food Insecurity: Not on file  Transportation Needs: Not on file  Physical Activity: Not on file  Stress: Not on file  Social Connections: Not on file  Intimate Partner Violence: Not on file    Outpatient Encounter Medications as of 10/02/2020  Medication Sig   Cholecalciferol (VITAMIN D-3) 25 MCG (1000 UT) CAPS Take 1 capsule by mouth daily.   DEXILANT 60 MG capsule Take 1 capsule (60 mg total) by mouth daily. Symptoms refractory to Prevacid and Omeprazole.  New PA needed for dexilant. May need to submit as brand   DULoxetine (CYMBALTA) 60 MG capsule Take 1 capsule (60 mg total) by mouth daily.   erythromycin ophthalmic ointment SMARTSIG:In Eye(s)   hydrocortisone cream 1 % Apply 1 application topically 2 (two) times daily.   hyoscyamine (LEVSIN SL) 0.125  Subjective:  Patient ID: Ruth Terry, female    DOB: 1955/08/02, 65 y.o.   MRN: [id number]  Patient Care Team: [name], DO as PCP - General (Family Medicine) Lavera Guise, [facility] (Pharmacist) Leotis Pain as Physician Assistant (Gastroenterology) Marybelle Killings, MD as Consulting Physician (Orthopedic Surgery)   Chief Complaint:  Herpes Zoster   HPI: Ruth Terry is a 64 y.o. female presenting on 10/02/2020 for Herpes Zoster   Pt presents today with complaints of possible shingles. States Sunday night she had a burning near her spine and noticed 2 red bumps. Denies fever, chills, weakness, or drainage from the lesions. States she would also like provider to reassess her hemorrhoids as they have been bothersome. No rectal bleeding but does have pain and pruritis.    Relevant past medical, surgical, family, and social history reviewed and updated as indicated.  Allergies and medications reviewed and updated. Data reviewed: Chart in Epic.   Past Medical History:  Diagnosis Date   Anal fissure    Chronic constipation    Diverticulosis 04-20-10   colonoscopy   Gastroparesis    GERD (gastroesophageal reflux disease)    Hemorrhoids    Hyperlipidemia    IBS (irritable bowel syndrome)    MVP (mitral valve prolapse)    Polyp, stomach 04-20-10   egd   Shingles    UTI (lower urinary tract infection)     Past Surgical History:  Procedure Laterality Date   COLONOSCOPY     ESOPHAGOGASTRODUODENOSCOPY      Social History   Socioeconomic History   Marital status: Married    Spouse name: Not on file   Number of children: 0   Years of education: Not on file   Highest education level: Not on file  Occupational History   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    Employer: [employer]  Tobacco Use   Smoking status: Former    Packs/day: 0.50    Years: 6.00    Pack years: 3.00    Types: Cigarettes   Smokeless  tobacco: Never   Tobacco comments:    USE SMOKELESS CIGARETTES  Vaping Use   Vaping Use: Every day   Substances: Nicotine  Substance and Sexual Activity   Alcohol use: Yes    Alcohol/week: 0.0 standard drinks    Comment: 1 a week, wine   Drug use: No   Sexual activity: Not on file  Other Topics Concern   Not on file  Social History Narrative   Not on file   Social Determinants of Health   Financial Resource Strain: Not on file  Food Insecurity: Not on file  Transportation Needs: Not on file  Physical Activity: Not on file  Stress: Not on file  Social Connections: Not on file  Intimate Partner Violence: Not on file    Outpatient Encounter Medications as of 10/02/2020  Medication Sig   Cholecalciferol (VITAMIN D-3) 25 MCG (1000 UT) CAPS Take 1 capsule by mouth daily.   DEXILANT 60 MG capsule Take 1 capsule (60 mg total) by mouth daily. Symptoms refractory to Prevacid and Omeprazole.  New PA needed for dexilant. May need to submit as brand   DULoxetine (CYMBALTA) 60 MG capsule Take 1 capsule (60 mg total) by mouth daily.   erythromycin ophthalmic ointment SMARTSIG:In Eye(s)   hydrocortisone cream 1 % Apply 1 application topically 2 (two) times daily.   hyoscyamine (LEVSIN SL) 0.125

## 2020-10-16 ENCOUNTER — Telehealth: Payer: Self-pay | Admitting: Physician Assistant

## 2020-10-16 NOTE — Telephone Encounter (Signed)
Inbound call from patient requesting a call from a nurse please.  She has an appt scheduled for 10/23/2020 but wants to know what she can do until then to help with hemorrhoid.  Please advise.

## 2020-10-16 NOTE — Telephone Encounter (Signed)
The patient calls with complaints of rectal pain. She was seen by PCP for the same on 10/02/20.  Today she is in bed because her pain is so severe. States the only thing that makes her more comfortable is an ice compress. She is also using OTC Preparation H. Warmth makes her pain worse. States she is so uncomfortable she hurts all over. She thought about going to the ER for the pain. The pressure of stool makes her more uncomfortable. No pain with the passage of stool. Denies constipation.  Recommended she try Recticare OTC. She would like to know what you recommend. Also has a "cyst" at her tailbone she wants you to look at . She has an appointment on 10/22/20. I have checked for an earlier opening without success.

## 2020-10-17 NOTE — Telephone Encounter (Signed)
Spoke with patient in regards to [name]'s recommendation. Pt wanted more recommendations for her hemorrhoids. Advised pt to try Recticare OTC, use wet wipes instead of toilet paper, pat the area dry and do no vigorously rub the area, discussed Sitz's baths. Advised that if she is dealing with constipation or straining she can use Miralax and drink plenty of water. Pt will keep appt as scheduled for 10/19 for further evaluation. Pt verbalized understanding and had no concerns at the end of the call.

## 2020-10-22 ENCOUNTER — Ambulatory Visit (INDEPENDENT_AMBULATORY_CARE_PROVIDER_SITE_OTHER): Payer: 59 | Admitting: Physician Assistant

## 2020-10-22 ENCOUNTER — Encounter: Payer: Self-pay | Admitting: Physician Assistant

## 2020-10-22 VITALS — BP 110/70 | Ht 60.0 in | Wt 104.2 lb

## 2020-10-22 DIAGNOSIS — R1310 Dysphagia, unspecified: Secondary | ICD-10-CM

## 2020-10-22 DIAGNOSIS — Z1211 Encounter for screening for malignant neoplasm of colon: Secondary | ICD-10-CM

## 2020-10-22 DIAGNOSIS — K219 Gastro-esophageal reflux disease without esophagitis: Secondary | ICD-10-CM | POA: Diagnosis not present

## 2020-10-22 DIAGNOSIS — K6289 Other specified diseases of anus and rectum: Secondary | ICD-10-CM | POA: Diagnosis not present

## 2020-10-22 DIAGNOSIS — K648 Other hemorrhoids: Secondary | ICD-10-CM

## 2020-10-22 DIAGNOSIS — K5909 Other constipation: Secondary | ICD-10-CM

## 2020-10-22 DIAGNOSIS — K3184 Gastroparesis: Secondary | ICD-10-CM

## 2020-10-22 MED ORDER — HYDROCORTISONE ACETATE 25 MG RE SUPP
RECTAL | 4 refills | Status: AC
Start: 2020-10-22 — End: 2020-11-19

## 2020-10-22 MED ORDER — PEG 3350-KCL-NA BICARB-NACL 420 G PO SOLR: 4000.0000 mL | Freq: Once | ORAL | 0 refills | Status: AC

## 2020-10-22 NOTE — Progress Notes (Signed)
Subjective:    Patient ID: Ruth Terry, female    DOB: 08-Jul-1955, 65 y.o.   MRN: [id number]  HPI Ruth Terry is a 65 year old female, established with Dr. Marina Goodell and known to myself with history of IBS-C, gastroparesis, chronic abdominal pain, GERD, chronic dyspepsia, prior diagnosis of SIBO.  She was last seen here on 07/31/2020. She has been on Linzess 145 mcg daily and that has been working fairly well for her though she continues to have intermittent sensation of incomplete bowel evacuation she is having bowel movements daily.  She has been given a diagnosis of pancreatic insufficiency by abnormal fecal elastase done per her PCP, continues to tolerate current Zenpep dosing.  At time of her last visit she was having some hemorrhoidal complaints, on rectal exam noted heme-negative stool and a small nonthrombosed internal hemorrhoid was palpable.  She was asked to do a 5 to 7-day course of Anusol HC suppositories at bedtime and repeat as needed.  We discussed colonoscopy for surveillance as last exam was done in 2012, and also plan to proceed with EGD given her chronic dyspeptic complaints.  She was not ready to schedule at that time, has been under a lot of continued stress due to illness in her family.  She comes in now complaining of severe internal rectal pain which she says has been present over the past couple of weeks she describes this as a constant throbbing and burning pressure type pain that at times makes her nauseated She says the pain was so bad at 1 point she thought she would pass out.  She continues to have bowel movements daily does note some increase in rectal pain just prior to defecation and some relief after bowel movements.  She had been seen by primary care twice in September at 1 point noted to have internal and external hemorrhoids and then on 1 of those visits felt to have a pilonidal cyst and was to be referred to surgery.  Patient says she has not heard back from surgery  regarding appointment She has not had any fevers, no definite chills no rectal bleeding.  She has been using ice packs at times for the rectal pain which she says is somewhat helpful and has been using a doughnut when sitting.  She mentions that she does have L5 disc disease and has been having some pain bilaterally into her buttocks at times.  Review of Systems Pertinent positive and negative review of systems were noted in the above HPI section.  All other review of systems was otherwise negative.   Outpatient Encounter Medications as of 10/22/2020  Medication Sig   Cholecalciferol (VITAMIN D-3) 25 MCG (1000 UT) CAPS Take 1 capsule by mouth daily.   DEXILANT 60 MG capsule Take 1 capsule (60 mg total) by mouth daily. Symptoms refractory to Prevacid and Omeprazole.  New PA needed for dexilant. May need to submit as brand   DULoxetine (CYMBALTA) 60 MG capsule Take 1 capsule (60 mg total) by mouth daily.   hydrocortisone (ANUSOL-HC) 25 MG suppository Place 1 suppository (25 mg total) rectally 2 (two) times daily for 14 days, THEN 1 suppository (25 mg total) at bedtime for 14 days.   hydrocortisone cream 1 % Apply 1 application topically 2 (two) times daily.   hyoscyamine (LEVSIN SL) 0.125 MG SL tablet Place 1 tablet (0.125 mg total) under the tongue every 6 (six) hours as needed.   ibandronate (BONIVA) 150 MG tablet Take 1 tablet (150 mg total) by mouth  every 30 (thirty) days. Take in the morning with a full glass of water, on an empty stomach, and do not take anything else by mouth or lie down for the next 30 min.   levocetirizine (XYZAL) 5 MG tablet Take 1 tablet (5 mg total) by mouth every evening. For allergies   linaclotide (LINZESS) 145 MCG CAPS capsule Take 1 capsule (145 mcg total) by mouth daily before breakfast.   metoCLOPramide (REGLAN) 5 MG tablet Take 1 tablet (5 mg total) by mouth 3 (three) times daily before meals.   montelukast (SINGULAIR) 10 MG tablet Take 1 tablet (10 mg total) by  mouth at bedtime.   polyethylene glycol-electrolytes (NULYTELY) 420 g solution Take 4,000 mLs by mouth once for 1 dose.   Probiotic Product (ALIGN) 4 MG CAPS Take 1 capsule by mouth daily.   psyllium (METAMUCIL) 58.6 % packet Take 1 packet by mouth daily.   Rectal Protectant-Emollient (CALMOL-4) 76-10 % SUPP Place 1 suppository rectally as needed (for hemmorrhoids).   Simethicone (PHAZYME MAXIMUM STRENGTH) 250 MG CAPS Take 250 mg by mouth 3 (three) times daily. (Patient taking differently: Take 250 mg by mouth 3 (three) times daily as needed (gas relief).)   triamcinolone (NASACORT) 55 MCG/ACT AERO nasal inhaler Place 2 sprays into the nose daily.   valACYclovir (VALTREX) 1000 MG tablet TAKE 1 TABLET (1,000 MG TOTAL) BY MOUTH AT BEDTIME.   Pancrelipase, Lip-Prot-Amyl, (ZENPEP) 25000-79000 units CPEP Take 2 capsules by mouth 3 (three) times daily with meals. (Patient taking differently: Take 1 capsule by mouth 2 (two) times daily with a meal.)   [DISCONTINUED] AMBULATORY NON FORMULARY MEDICATION Medication Name: Domperidone 10 mg Take 1 tablet at bedtime daily. (Patient taking differently: Take 1 tablet by mouth at bedtime. Medication Name: Domperidone 10 mg Take 1 tablet at bedtime daily.)   [DISCONTINUED] erythromycin ophthalmic ointment SMARTSIG:In Eye(s)   [DISCONTINUED] nystatin (MYCOSTATIN) 100000 UNIT/ML suspension Take 5 mLs (500,000 Units total) by mouth 4 (four) times daily.   [DISCONTINUED] ofloxacin (OCUFLOX) 0.3 % ophthalmic solution SMARTSIG:1 Drop(s) In Eye(s) Every Hour   [DISCONTINUED] Plecanatide (TRULANCE) 3 MG TABS Take 1 tablet by mouth daily.   Facility-Administered Encounter Medications as of 10/22/2020  Medication   methylPREDNISolone acetate (DEPO-MEDROL) injection 40 mg   Allergies  Allergen Reactions   Chlordiazepoxide-Clidinium Itching   Ciprofloxacin Itching    Irritates stomach   Flagyl [Metronidazole]     Severe yeast infection   Sulfa Drugs Cross Reactors  Nausea Only   Cefdinir Nausea And Vomiting   Nitrofurantoin Monohyd Macro Rash   Patient Active Problem List   Diagnosis Date Noted   History of shingles 04/24/2019   Chronic midline low back pain without sciatica 02/27/2019   Chronic idiopathic constipation 04/26/2018   Aortic atherosclerosis (HCC) 03/01/2017   Rib fracture 12/07/2016   Gas pain 03/09/2016   Anal itching 03/09/2016   Elevated LDL cholesterol level 10/24/2015   Internal hemorrhoids 05/14/2014   GERD (gastroesophageal reflux disease) 09/17/2011   IBS (irritable bowel syndrome) 08/25/2010   Gastroparesis 06/30/2010   Non-ulcer dyspepsia 04/03/2010   Social History   Socioeconomic History   Marital status: Married    Spouse name: Not on file   Number of children: 0   Years of education: Not on file   Highest education level: Not on file  Occupational History   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    Employer: [employer]  Tobacco Use   Smoking status: Former  Packs/day: 0.50    Years: 6.00    Pack years: 3.00    Types: Cigarettes   Smokeless tobacco: Never   Tobacco comments:    USE SMOKELESS CIGARETTES  Vaping Use   Vaping Use: Every day   Substances: Nicotine  Substance and Sexual Activity   Alcohol use: Yes    Alcohol/week: 0.0 standard drinks    Comment: 1 a week, wine   Drug use: No   Sexual activity: Not on file  Other Topics Concern   Not on file  Social History Narrative   Not on file   Social Determinants of Health   Financial Resource Strain: Not on file  Food Insecurity: Not on file  Transportation Needs: Not on file  Physical Activity: Not on file  Stress: Not on file  Social Connections: Not on file  Intimate Partner Violence: Not on file    Ms. Fretwell's family history includes Diabetes in her father; Heart attack in her father; Hypertension in her mother.      Objective:    Vitals:   10/22/20 1423  BP: 110/70    Physical  Exam Well-developed well-nourished  older WF  in no acute distress.   [id number]  BMI 20.3  HEENT; nontraumatic normocephalic, EOMI, PE R LA, sclera anicteric. Oropharynx;not examined today Neck; supple, no JVD Cardiovascular; regular rate and rhythm with S1-S2, no murmur rub or gallop Pulmonary; Clear bilaterally Abdomen; soft, nontender, nondistended, no palpable mass or hepatosplenomegaly, bowel sounds are active Rectal; no external lesion noted- I do not appreciate a pilonidal cyst  , on anoscopy she has a moderately large internal hemorrhoid which is inflamed/irritated-no thrombosis, no anal fissure, no severe tenderness to palpation Skin; benign exam, no jaundice rash or appreciable lesions Extremities; no clubbing cyanosis or edema skin warm and dry Neuro/Psych; alert and oriented x4, grossly nonfocal mood and affect appropriate        Assessment & Plan:   #69 65 year old white female with multiple GI issues who comes in today with primary complaint of severe rectal pain which has been present over the past couple of weeks, described as constant throbbing burning and pressure-like. On exam and anoscopy she has an inflamed moderate sized internal hemorrhoid on the right, no anal fissure, no mass palpable She had been told by primary care that she had a pilonidal cyst when she was seen about a month ago, I see no evidence of definite pilonidal cyst on exam.  She is awaiting surgical consultation  I believe her rectal pain is secondary to inflamed internal hemorrhoid.  Consider a radicular type of pain with known L5 disc disease  #2 colon cancer surveillance-last colonoscopy 2012 due for follow-up #3 GERD stable on Dexilant #4 history of gastroparesis-currently on metoclopramide 5 mg 3 times daily AC #5 pancreatic insufficiency diagnosed with fecal elastase, continues on Zenpep, (25000-79000) 2 capsules 3 times daily #6 chronic constipation-improved on Linzess 145 mcg daily #7 chronic  dyspepsia-prior Rx for SIBO #8 chronic anxiety, situational stress  Plan; start RectiCare/lidocaine 5% 4 times daily applied 1 inch into the anus Start Anusol HC suppositories twice daily x2 weeks, then nightly x2 weeks, continue at bedtime if persistent symptoms Ice packs 2-3 times daily, continue to use doughnut when sitting Patient is asked to call back in 2 weeks if symptoms have not improved significantly could consider pelvic MRI Patient will be scheduled for colonoscopy and EGD with Dr. Marina Goodell.  Both procedures were discussed in detail with the patient including indications risks  and benefits and she is agreeable to proceed She is asked to follow through with surgical consultation, though I did do not definitely appreciate a pilonidal cyst at present. Continue other GI regimen as outlined above  Laurance Heide S [place] Moyd PA-C 10/22/2020   Cc: Ruth Ip, DO

## 2020-10-22 NOTE — Patient Instructions (Signed)
If you are age 63 or younger, your body mass index should be between 19-25. Your Body mass index is 20.36 kg/m. If this is out of the aformentioned range listed, please consider follow up with your Primary Care Provider.  ________________________________________________________  The [facility] providers would like to encourage you to use [facility] to communicate with providers for non-urgent requests or questions.  Due to long hold times on the telephone, sending your provider a message by [facility] may be a faster and more efficient way to get a response.  Please allow 48 business hours for a response.  Please remember that this is for non-urgent requests.   You have been scheduled for an endoscopy and colonoscopy. Please follow the written instructions given to you at your visit today. Please pick up your prep supplies at the pharmacy within the next 1-3 days. If you use inhalers (even only as needed), please bring them with you on the day of your procedure.  Continue your current medications  START Hydrocortisone suppositories, 1 suppository rectally twice a day for 2 weeks, then 1 at bed time for 2 weeks. Repeat as needed.  Get RectiCare ointment OTC apply into the rectum before placing suppository  along with 2 other applications daily.  Use ice packs.  Call or message the office in 2 weeks if you are not better.  Thank you for entrusting me with your care and choosing [facility].  [name] Esterwood, PA-C

## 2020-10-22 NOTE — Progress Notes (Signed)
Assessment and plans noted ?

## 2020-10-23 ENCOUNTER — Telehealth: Payer: Self-pay | Admitting: Physician Assistant

## 2020-10-23 NOTE — Telephone Encounter (Signed)
Inbound call from patient requesting a call back to discuss medication.

## 2020-10-24 NOTE — Telephone Encounter (Signed)
Called and spoke with patient, she has advised me that she has been unable to find the Recticare with Lidocaine anywhere around where she lives. She did let me know that she would continue to look for it. She has been using her suppositories. She has also advised that she has started having chills where she really starts to shake, but denies any fever.

## 2020-10-28 NOTE — Telephone Encounter (Signed)
Patient advised to check [facility] for anorectal lidocaine cream. She is aware that if she continues with chills, that she should contact her PCP. Patient will continue medication and call the office with an update.

## 2020-11-03 ENCOUNTER — Encounter: Payer: Self-pay | Admitting: Family Medicine

## 2020-11-03 ENCOUNTER — Ambulatory Visit (INDEPENDENT_AMBULATORY_CARE_PROVIDER_SITE_OTHER): Payer: 59 | Admitting: Family Medicine

## 2020-11-03 DIAGNOSIS — R300 Dysuria: Secondary | ICD-10-CM | POA: Diagnosis not present

## 2020-11-03 LAB — URINALYSIS, COMPLETE
Bilirubin, UA: NEGATIVE
Glucose, UA: NEGATIVE
Ketones, UA: NEGATIVE
Leukocytes,UA: NEGATIVE
Nitrite, UA: POSITIVE — AB
Protein,UA: NEGATIVE
RBC, UA: NEGATIVE
Specific Gravity, UA: 1.01 (ref 1.005–1.030)
Urobilinogen, Ur: 0.2 mg/dL (ref 0.2–1.0)
pH, UA: 6 (ref 5.0–7.5)

## 2020-11-03 LAB — MICROSCOPIC EXAMINATION
Bacteria, UA: NONE SEEN
RBC, Urine: NONE SEEN /hpf (ref 0–2)

## 2020-11-03 NOTE — Progress Notes (Signed)
Virtual Visit via telephone Note  I connected with Ruth Terry on 11/03/20 at 1204 by telephone and verified that I am speaking with the correct person using two identifiers. Ruth Terry is currently located at home and patient are currently with her during visit. The provider, Fransisca Kaufmann Yadier Bramhall, MD is located in their office at time of visit.  Call ended at 1210  I discussed the limitations, risks, security and privacy concerns of performing an evaluation and management service by telephone and the availability of in person appointments. I also discussed with the patient that there may be a patient responsible charge related to this service. The patient expressed understanding and agreed to proceed.   History and Present Illness: Patient is having urinary frequency and urgency that has been bothering her.  She has chills yesterday but no fevers. She denies hematuria. She denies vaginal discharge or bleeding.   1. Dysuria     Outpatient Encounter Medications as of 11/03/2020  Medication Sig   Cholecalciferol (VITAMIN D-3) 25 MCG (1000 UT) CAPS Take 1 capsule by mouth daily.   DEXILANT 60 MG capsule Take 1 capsule (60 mg total) by mouth daily. Symptoms refractory to Prevacid and Omeprazole.  New PA needed for dexilant. May need to submit as brand   DULoxetine (CYMBALTA) 60 MG capsule Take 1 capsule (60 mg total) by mouth daily.   hydrocortisone (ANUSOL-HC) 25 MG suppository Place 1 suppository (25 mg total) rectally 2 (two) times daily for 14 days, THEN 1 suppository (25 mg total) at bedtime for 14 days.   hydrocortisone cream 1 % Apply 1 application topically 2 (two) times daily.   hyoscyamine (LEVSIN SL) 0.125 MG SL tablet Place 1 tablet (0.125 mg total) under the tongue every 6 (six) hours as needed.   ibandronate (BONIVA) 150 MG tablet Take 1 tablet (150 mg total) by mouth every 30 (thirty) days. Take in the morning with a full glass of water, on an empty stomach, and do not  take anything else by mouth or lie down for the next 30 min.   levocetirizine (XYZAL) 5 MG tablet Take 1 tablet (5 mg total) by mouth every evening. For allergies   linaclotide (LINZESS) 145 MCG CAPS capsule Take 1 capsule (145 mcg total) by mouth daily before breakfast.   metoCLOPramide (REGLAN) 5 MG tablet Take 1 tablet (5 mg total) by mouth 3 (three) times daily before meals.   montelukast (SINGULAIR) 10 MG tablet Take 1 tablet (10 mg total) by mouth at bedtime.   Pancrelipase, Lip-Prot-Amyl, (ZENPEP) 25000-79000 units CPEP Take 2 capsules by mouth 3 (three) times daily with meals. (Patient taking differently: Take 1 capsule by mouth 2 (two) times daily with a meal.)   Probiotic Product (ALIGN) 4 MG CAPS Take 1 capsule by mouth daily.   psyllium (METAMUCIL) 58.6 % packet Take 1 packet by mouth daily.   Rectal Protectant-Emollient (CALMOL-4) 76-10 % SUPP Place 1 suppository rectally as needed (for hemmorrhoids).   Simethicone (PHAZYME MAXIMUM STRENGTH) 250 MG CAPS Take 250 mg by mouth 3 (three) times daily. (Patient taking differently: Take 250 mg by mouth 3 (three) times daily as needed (gas relief).)   triamcinolone (NASACORT) 55 MCG/ACT AERO nasal inhaler Place 2 sprays into the nose daily.   valACYclovir (VALTREX) 1000 MG tablet TAKE 1 TABLET (1,000 MG TOTAL) BY MOUTH AT BEDTIME.   [DISCONTINUED] AMBULATORY NON FORMULARY MEDICATION Medication Name: Domperidone 10 mg Take 1 tablet at bedtime daily. (Patient taking differently: Take 1  tablet by mouth at bedtime. Medication Name: Domperidone 10 mg Take 1 tablet at bedtime daily.)   Facility-Administered Encounter Medications as of 11/03/2020  Medication   methylPREDNISolone acetate (DEPO-MEDROL) injection 40 mg    Review of Systems  Constitutional:  Negative for chills and fever.  Eyes:  Negative for visual disturbance.  Respiratory:  Negative for chest tightness and shortness of breath.   Cardiovascular:  Negative for chest pain and leg  swelling.  Gastrointestinal:  Negative for abdominal pain.  Genitourinary:  Positive for dysuria, frequency and urgency. Negative for difficulty urinating, flank pain, hematuria, vaginal bleeding and vaginal discharge.  Musculoskeletal:  Negative for back pain and gait problem.  Skin:  Negative for rash.  Neurological:  Negative for light-headedness and headaches.  Psychiatric/Behavioral:  Negative for agitation and behavioral problems.   All other systems reviewed and are negative.  Observations/Objective: Patient sounds comfortable and in no acute distress  Assessment and Plan: Problem List Items Addressed This Visit   None Visit Diagnoses     Dysuria    -  Primary   Relevant Orders   Urinalysis, Complete   Urine Culture       She can take keflex and amoxicillin if needed, she will come in and leave a urine and then will watch for the results and treat accordingly. Follow up plan: Return if symptoms worsen or fail to improve.     I discussed the assessment and treatment plan with the patient. The patient was provided an opportunity to ask questions and all were answered. The patient agreed with the plan and demonstrated an understanding of the instructions.   The patient was advised to call back or seek an in-person evaluation if the symptoms worsen or if the condition fails to improve as anticipated.  The above assessment and management plan was discussed with the patient. The patient verbalized understanding of and has agreed to the management plan. Patient is aware to call the clinic if symptoms persist or worsen. Patient is aware when to return to the clinic for a follow-up visit. Patient educated on when it is appropriate to go to the emergency department.    I provided 6 minutes of non-face-to-face time during this encounter.    Worthy Rancher, MD

## 2020-11-04 ENCOUNTER — Telehealth: Payer: Self-pay | Admitting: Family Medicine

## 2020-11-04 NOTE — Telephone Encounter (Signed)
Dettinger is off - will refer to PCP  Was seen yesterday -culture is still pending

## 2020-11-04 NOTE — Telephone Encounter (Signed)
Pt aware and kept demanding abx I kept telling her we cant prescribe abx for something that did not come back postive. States she was postive for nitrates and that google said she has a uti. I told herwe are suppose to have some nitrates in the urine and more than likely this is normal. Told he if she wanted to come back to leave abx that is completely up to her. States she will wait for culture to come back and try azo for now.

## 2020-11-04 NOTE — Telephone Encounter (Signed)
Encourage use of AZO.  Her dip had no bacteria or evidence of infection.  Culture has not come back.  There is no clinical indication to initiate medication yet.  She is welcome to come in and leave another urine sample if symptoms have changed from her checkup yesterday

## 2020-11-06 ENCOUNTER — Telehealth: Payer: Self-pay | Admitting: Family Medicine

## 2020-11-06 ENCOUNTER — Telehealth: Payer: Self-pay | Admitting: Physician Assistant

## 2020-11-06 NOTE — Telephone Encounter (Signed)
Pt has been informed that culture has not resulted.  She states that she is having back, abdominal and groin pain, chills, but no fever. She has had urinary incontinence. Also, has the feeling of having to go to the bathroom but then doesn't have to go.  Pt would like treatment even if culture comes back negative. States that she has missed work all week due to not feeling well.

## 2020-11-06 NOTE — Telephone Encounter (Signed)
Inbound call from pt requesting a call back stating that she has an internal hemorrhoid and would like to talk to a nurse. Please advise. Thank you.

## 2020-11-06 NOTE — Telephone Encounter (Signed)
Pt called to see if Urine Culture had come back yet that she had done on Monday 10/31. Confirmed with pt that result has not come back yet. Pt requested to speak with Dr Dettinger or his nurse, since that's who she had visit with. Says she is in a lot of pain and needs help. Pt was advised to use AZO but says she has tried that and its not helping at all.  Please advise and call patient.

## 2020-11-06 NOTE — Telephone Encounter (Signed)
Called the patient back. No answer. Left her a message.

## 2020-11-07 LAB — URINE CULTURE: Organism ID, Bacteria: NO GROWTH

## 2020-11-07 NOTE — Telephone Encounter (Signed)
Patient can schedule another appointment to bring in another urine if she wants to where she go to urgent care but our culture did not grow any bacteria so she may have other symptoms, it would probably be best if she come in for an in person visit to probably be examined maybe she has vaginal discharge and thinks it is a urinary issue

## 2020-11-07 NOTE — Telephone Encounter (Signed)
Left detailed message Dettinger will not call anything in until culture comes back

## 2020-11-07 NOTE — Telephone Encounter (Signed)
Patient seen on mychart and a note was put there to send to dettinger

## 2020-11-07 NOTE — Telephone Encounter (Signed)
Pt has been informed and understood. States that she does feel better since we spoke yesterday but still not feeling "good" States that she may try to go to urgent care over the weekend

## 2020-11-07 NOTE — Telephone Encounter (Signed)
Calling [place] back about the urine culture. She was supposed to call patient back this morning.

## 2020-11-07 NOTE — Telephone Encounter (Signed)
Culture still pending.

## 2020-11-07 NOTE — Telephone Encounter (Signed)
  Incoming Patient Call  11/07/2020  What symptoms do you have? Still burning and peeing since last Sunday. Wants something called in because she has missed a whole week of work. Shows she has nitrates and usually has to take ABX. She is going to end up going to the Urgent Care if she don't get ABX.  How long have you been sick? One week  Have you been seen for this problem? yes  If your provider decides to give you a prescription, which pharmacy would you like for [facility] to [facility]   Patient informed that this information will be sent to the clinical staff for review and that they should receive a follow up call.

## 2020-11-08 ENCOUNTER — Other Ambulatory Visit: Payer: Self-pay

## 2020-11-08 ENCOUNTER — Emergency Department (HOSPITAL_COMMUNITY)
Admission: EM | Admit: 2020-11-08 | Discharge: 2020-11-08 | Disposition: A | Discharge status: Home / Self Care | Payer: 59 | Attending: Emergency Medicine | Admitting: Emergency Medicine

## 2020-11-08 ENCOUNTER — Encounter (HOSPITAL_COMMUNITY): Payer: Self-pay | Admitting: Emergency Medicine

## 2020-11-08 ENCOUNTER — Emergency Department (HOSPITAL_COMMUNITY): Payer: 59

## 2020-11-08 DIAGNOSIS — Z20822 Contact with and (suspected) exposure to covid-19: Secondary | ICD-10-CM | POA: Diagnosis not present

## 2020-11-08 DIAGNOSIS — M549 Dorsalgia, unspecified: Secondary | ICD-10-CM | POA: Diagnosis not present

## 2020-11-08 DIAGNOSIS — R350 Frequency of micturition: Secondary | ICD-10-CM | POA: Diagnosis not present

## 2020-11-08 DIAGNOSIS — K219 Gastro-esophageal reflux disease without esophagitis: Secondary | ICD-10-CM | POA: Diagnosis not present

## 2020-11-08 DIAGNOSIS — R109 Unspecified abdominal pain: Secondary | ICD-10-CM | POA: Insufficient documentation

## 2020-11-08 DIAGNOSIS — J069 Acute upper respiratory infection, unspecified: Secondary | ICD-10-CM | POA: Insufficient documentation

## 2020-11-08 DIAGNOSIS — Z87891 Personal history of nicotine dependence: Secondary | ICD-10-CM | POA: Diagnosis not present

## 2020-11-08 DIAGNOSIS — R300 Dysuria: Secondary | ICD-10-CM | POA: Insufficient documentation

## 2020-11-08 DIAGNOSIS — R42 Dizziness and giddiness: Secondary | ICD-10-CM | POA: Diagnosis present

## 2020-11-08 LAB — URINALYSIS, ROUTINE W REFLEX MICROSCOPIC
Bilirubin Urine: NEGATIVE
Glucose, UA: NEGATIVE mg/dL
Hgb urine dipstick: NEGATIVE
Ketones, ur: NEGATIVE mg/dL
Leukocytes,Ua: NEGATIVE
Nitrite: NEGATIVE
Protein, ur: NEGATIVE mg/dL
Specific Gravity, Urine: 1.001 — ABNORMAL LOW (ref 1.005–1.030)
pH: 6 (ref 5.0–8.0)

## 2020-11-08 LAB — BASIC METABOLIC PANEL
Anion gap: 7 (ref 5–15)
BUN: 6 mg/dL — ABNORMAL LOW (ref 8–23)
CO2: 29 mmol/L (ref 22–32)
Calcium: 9.8 mg/dL (ref 8.9–10.3)
Chloride: 105 mmol/L (ref 98–111)
Creatinine, Ser: 0.64 mg/dL (ref 0.44–1.00)
GFR, Estimated: >60 mL/min (ref >60)
Glucose, Bld: 87 mg/dL (ref 70–99)
Potassium: 3.5 mmol/L (ref 3.5–5.1)
Sodium: 141 mmol/L (ref 135–145)

## 2020-11-08 LAB — CBC
HCT: 44.4 % (ref 36.0–46.0)
Hemoglobin: 14.8 g/dL (ref 12.0–15.0)
MCH: 30.7 pg (ref 26.0–34.0)
MCHC: 33.3 g/dL (ref 30.0–36.0)
MCV: 92.1 fL (ref 80.0–100.0)
Platelets: 243 10*3/uL (ref 150–400)
RBC: 4.82 MIL/uL (ref 3.87–5.11)
RDW: 13.3 % (ref 11.5–15.5)
WBC: 5.5 10*3/uL (ref 4.0–10.5)
nRBC: 0 % (ref 0.0–0.2)

## 2020-11-08 LAB — RESPIRATORY PANEL BY PCR

## 2020-11-08 LAB — RESP PANEL BY RT-PCR (FLU A&B, COVID) ARPGX2
Influenza A by PCR: NEGATIVE
Influenza B by PCR: NEGATIVE
SARS Coronavirus 2 by RT PCR: NEGATIVE

## 2020-11-08 IMAGING — CT CT RENAL STONE PROTOCOL
2 of 4 series · 15 of 46 positions shown, 17 images · non-contrast
Comparison: [DATE]

CLINICAL DATA: Bilateral flank pain for 5 days. Kidney stone
suspected. Patient recently diagnosed with UTI.

EXAM:
CT ABDOMEN AND PELVIS WITHOUT CONTRAST
TECHNIQUE: Multidetector CT imaging of the abdomen and pelvis was performed
following the standard protocol without IV contrast.

[Series 2: axial st · axial · 0.74mm/px · z∈[-823,-458]mm · 12 of 83 slices shown, 14 images]
[im 5/83  soft-tissue]
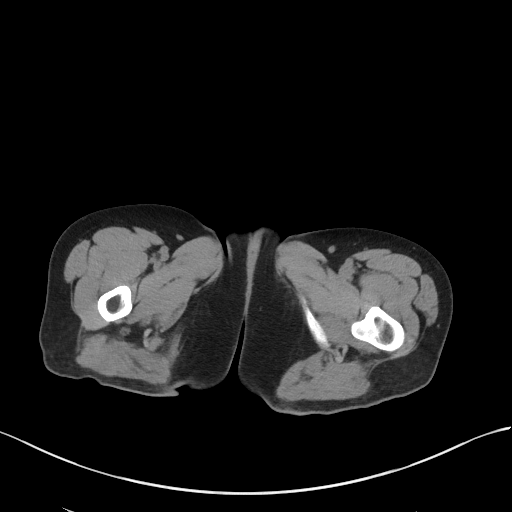
[im 5/83  bone]
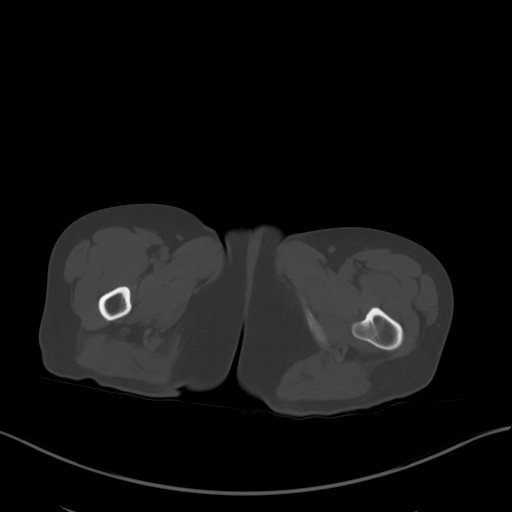
[im 15/83  soft-tissue]
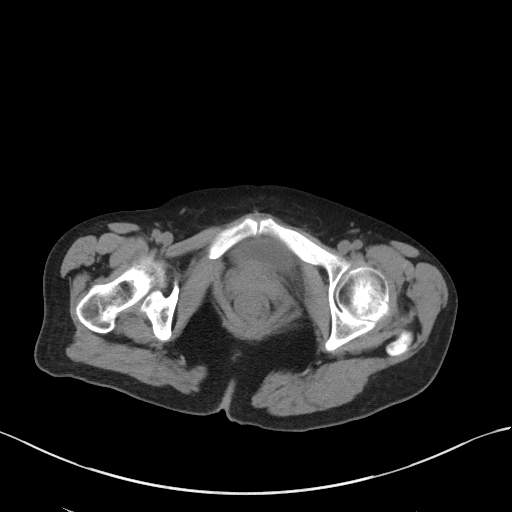
[im 20/83  soft-tissue]
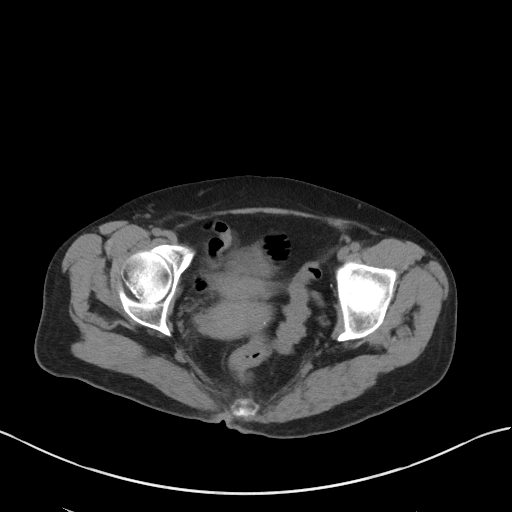
[im 25/83  soft-tissue]
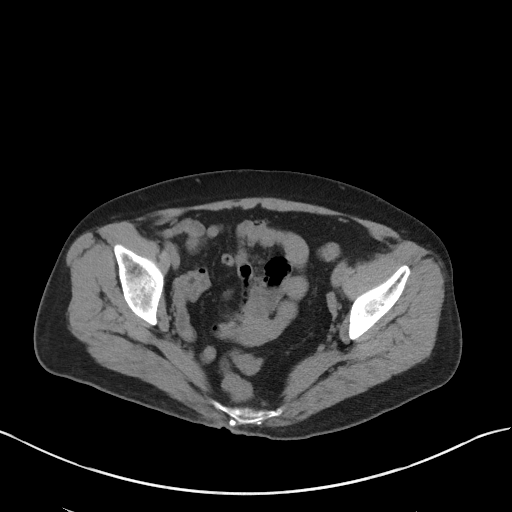
[im 34/83  soft-tissue]
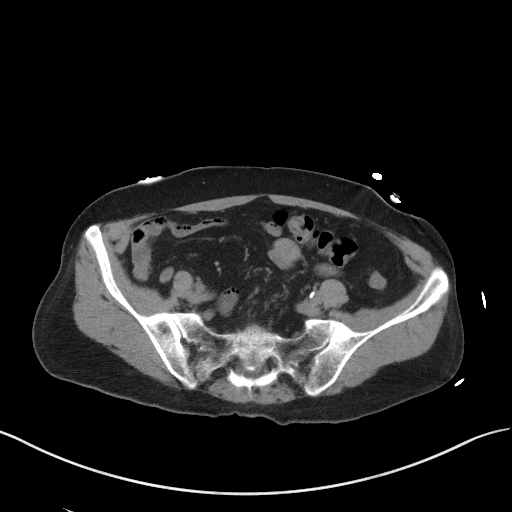
[im 39/83  soft-tissue]
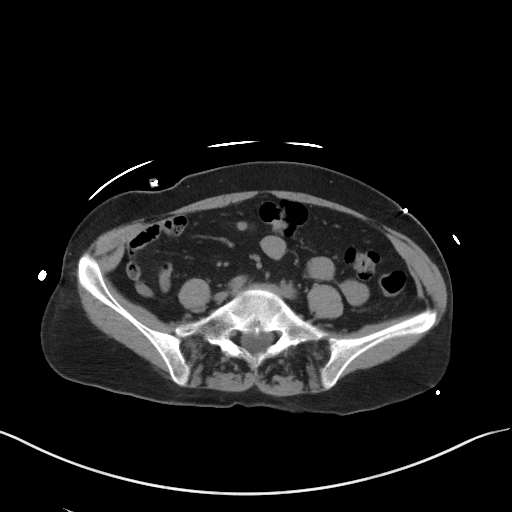
[im 44/83  soft-tissue]
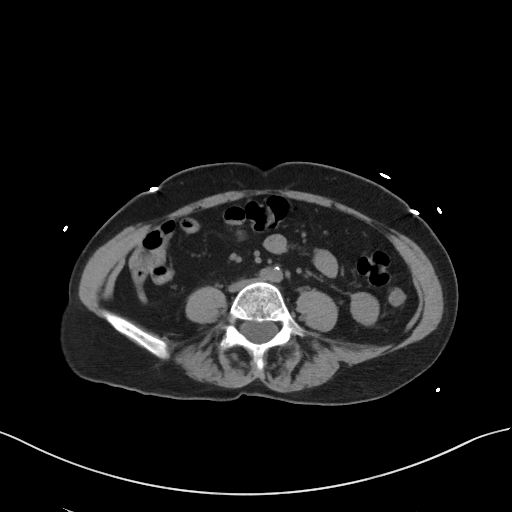
[im 54/83  soft-tissue]
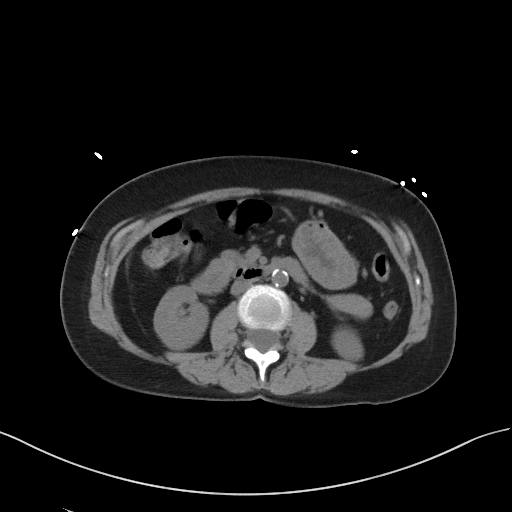
[im 58/83  soft-tissue]
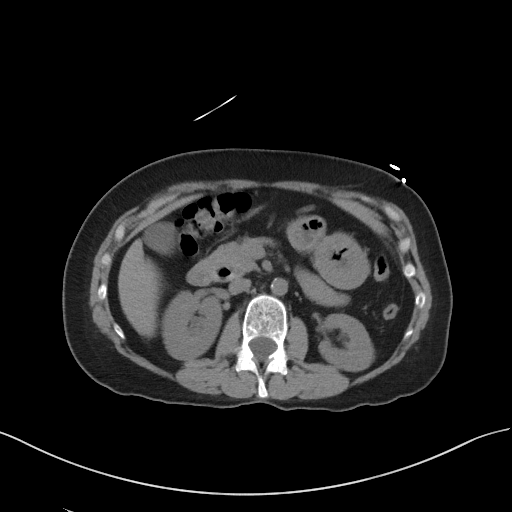
[im 58/83  bone]
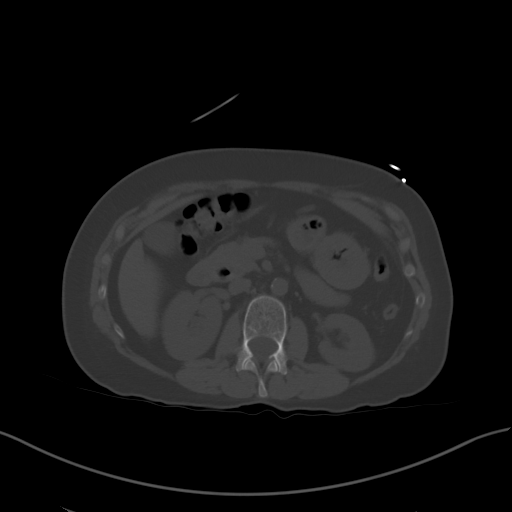
[im 63/83  soft-tissue]
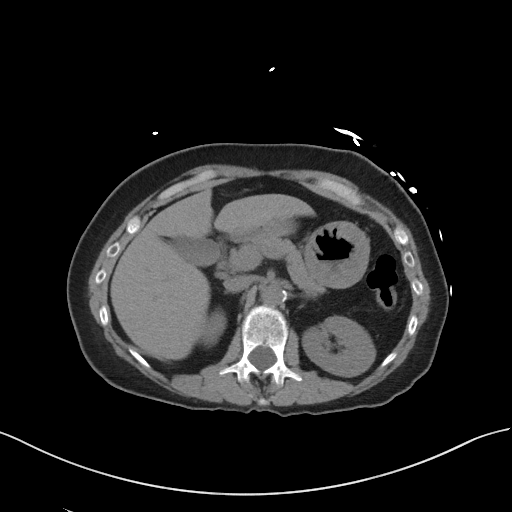
[im 73/83  soft-tissue]
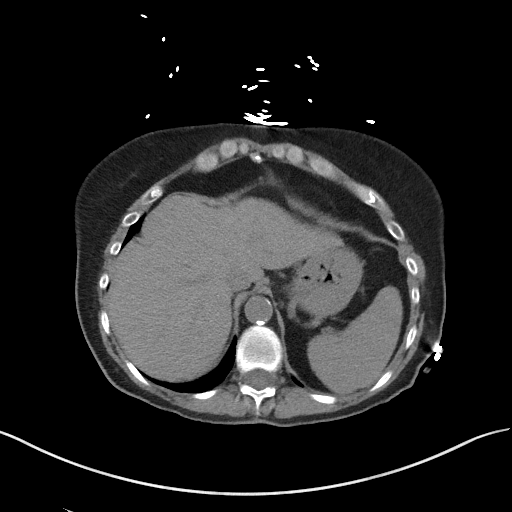
[im 78/83  soft-tissue]
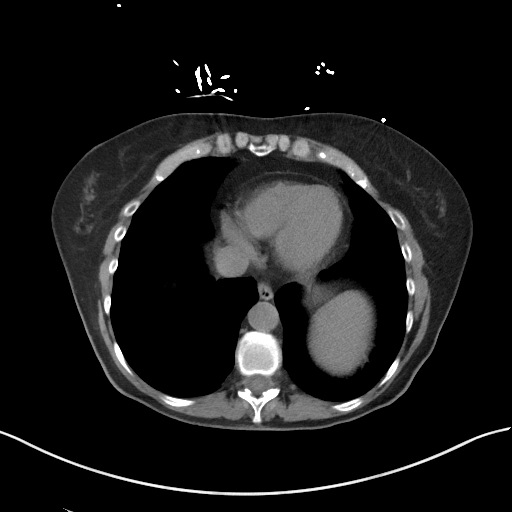

[Series 5: coronal st · coronal · 0.64mm/px · 3 of 87 slices shown]
[im 29/87  soft-tissue]
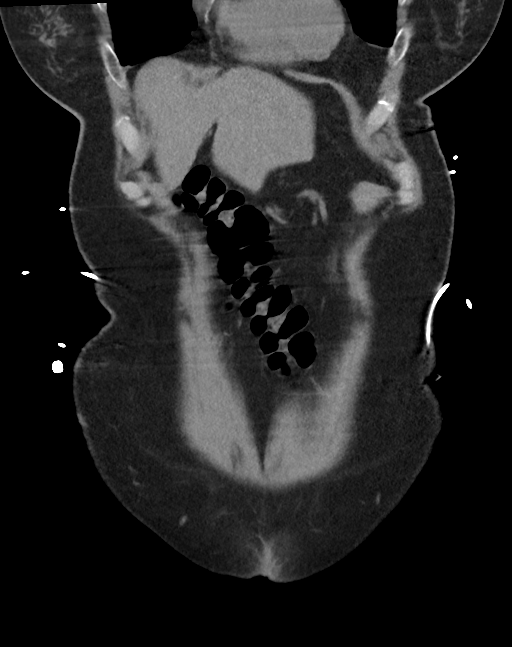
[im 39/87  soft-tissue]
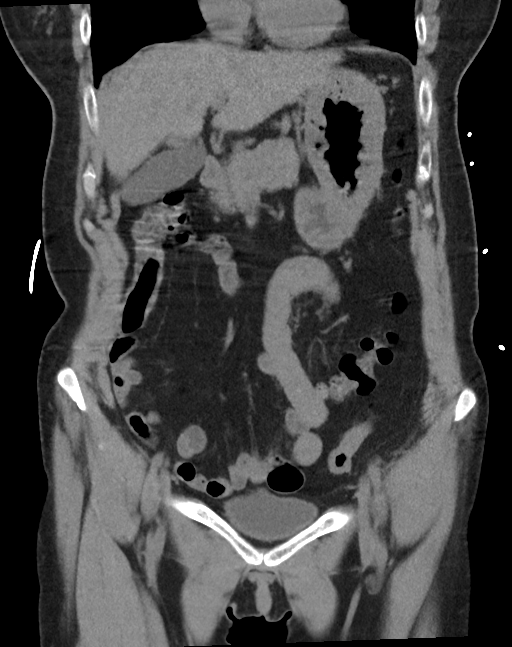
[im 48/87  soft-tissue]
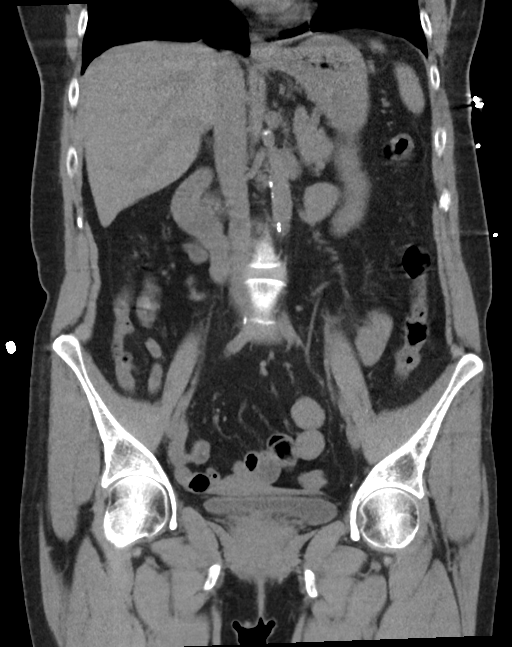

[15 of 46 positions shown; findings below may reference images not displayed]

FINDINGS: Lower chest: 4 mm nodule in the right lower lobe adjacent to the
fissure on series 4, image 6. This region was not included on
previous studies. No other abnormalities in the lung bases.

Hepatobiliary: No focal liver abnormality is seen. No gallstones,
gallbladder wall thickening, or biliary dilatation.

Pancreas: Unremarkable. No pancreatic ductal dilatation or
surrounding inflammatory changes.

Spleen: Normal in size without focal abnormality.

Adrenals/Urinary Tract: Adrenal glands are unremarkable. Kidneys are
normal, without renal calculi, focal lesion, or hydronephrosis.
Bladder is unremarkable.

Stomach/Bowel: The wall of the gastric fundus, cardia, and proximal
body appear somewhat thickened. The remainder of the stomach is
normal. Small bowel is normal. And appendix are unremarkable.

Vascular/Lymphatic: Calcified atherosclerosis in the nonaneurysmal
aorta. No adenopathy.

Reproductive: Uterus and bilateral adnexa are unremarkable.

Other: No free air free fluid. A fat containing umbilical hernia is
identified.

Musculoskeletal: No acute or significant osseous findings.
IMPRESSION: 1. 4 mm nodule in the right lower lobe adjacent to the fissure on
series 4, image 6. This region was not included on previous studies.
No follow-up needed if patient is low-risk. Non-contrast chest CT
can be considered in 12 months if patient is high-risk. This
recommendation follows the consensus statement: Guidelines for
Management of Incidental Pulmonary Nodules Detected on CT Images:
2. The wall of the gastric fundus, cardia, and proximal body appear
thickened. While this could be due to lack of distention, the
finding is similar since previous studies raising the possibility of
tree thickening. Endoscopy or upper GI could better evaluate.
Recommend clinical correlation.
3. Calcified atherosclerosis in the nonaneurysmal abdominal aorta.
4. Fat containing umbilical hernia.
5. No other abnormalities. Specifically, no renal stones or
obstruction.

## 2020-11-08 MED ORDER — FLUTICASONE PROPIONATE 50 MCG/ACT NA SUSP: 2.0000 | Freq: Every day | NASAL | 2 refills | Status: DC

## 2020-11-08 MED ORDER — PREDNISONE 10 MG (21) PO TBPK: ORAL_TABLET | Freq: Every day | ORAL | 0 refills | Status: DC

## 2020-11-08 MED ORDER — SODIUM CHLORIDE 0.9 % IV BOLUS
1000.0000 mL | Freq: Once | INTRAVENOUS | Status: AC
  Administered 2020-11-08: 1000 mL via INTRAVENOUS

## 2020-11-08 MED ORDER — MELOXICAM 15 MG PO TABS: 15.0000 mg | ORAL_TABLET | Freq: Every day | ORAL | 0 refills | Status: DC | PRN

## 2020-11-08 MED ORDER — PREDNISONE 50 MG PO TABS
60.0000 mg | ORAL_TABLET | Freq: Once | ORAL | Status: AC
  Administered 2020-11-08: 60 mg via ORAL
  Filled 2020-11-08: qty 1

## 2020-11-08 NOTE — ED Provider Notes (Addendum)
[facility] EMERGENCY DEPARTMENT Provider Note   CSN: [id number] Arrival date & time: 11/08/20  1245     History Chief Complaint  Patient presents with   Dizziness    Ruth Terry is a 65 y.o. female.  Pt presents to the ED today with dysuria and urinary frequency.  She also has dizziness and headache and back pain.  Pt said she has been feeling ill all week.  She did see her doctor earlier this week because she thought she had a UTI.  She said the doctor did not want to treat her with abx until the cultures came back.  The culture came back negative.  Pt said she is still worried she has a uti.  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      Past Medical History:  Diagnosis Date   Anal fissure    Chronic constipation    Diverticulosis 04-20-10   colonoscopy   Gastroparesis    GERD (gastroesophageal reflux disease)    Hemorrhoids    Hyperlipidemia    IBS (irritable bowel syndrome)    MVP (mitral valve prolapse)    Polyp, stomach 04-20-10   egd   Shingles    UTI (lower urinary tract infection)     Patient Active Problem List   Diagnosis Date Noted   History of shingles 04/24/2019   Chronic midline low back pain without sciatica 02/27/2019   Chronic idiopathic constipation 04/26/2018   Aortic atherosclerosis ([place]) 03/01/2017   Rib fracture 12/07/2016   Gas pain 03/09/2016   Anal itching 03/09/2016   Elevated LDL cholesterol level 10/24/2015   Internal hemorrhoids 05/14/2014   GERD (gastroesophageal reflux disease) 09/17/2011   IBS (irritable bowel syndrome) 08/25/2010   Gastroparesis 06/30/2010   Non-ulcer dyspepsia 04/03/2010    Past Surgical History:  Procedure Laterality Date   COLONOSCOPY     ESOPHAGOGASTRODUODENOSCOPY       OB History     Gravida  0   Para  0   Term  0   Preterm  0   AB  0   Living  0      SAB  0   IAB  0   Ectopic  0   Multiple  0   Live Births  0           Family History  Problem Relation Age of Onset    Hypertension Mother    Diabetes Father    Heart attack Father        Age 77    Social History   Tobacco Use   Smoking status: Former    Packs/day: 0.50    Years: 6.00    Pack years: 3.00    Types: Cigarettes   Smokeless tobacco: Never   Tobacco comments:    USE SMOKELESS CIGARETTES  Vaping Use   Vaping Use: Every day   Substances: Nicotine  Substance Use Topics   Alcohol use: Yes    Alcohol/week: 0.0 standard drinks    Comment: 1 a week, wine   Drug use: No    Home Medications Prior to Admission medications   Medication Sig Start Date End Date Taking? Authorizing Provider  Cholecalciferol (VITAMIN D-3) 25 MCG (1000 UT) CAPS Take 1 capsule by mouth daily.    [provider]  DEXILANT 60 MG capsule Take 1 capsule (60 mg total) by mouth daily. Symptoms refractory to Prevacid and Omeprazole.  New PA needed for dexilant. May need to submit as brand 03/11/20  Ronnie Doss M, DO  DULoxetine (CYMBALTA) 60 MG capsule Take 1 capsule (60 mg total) by mouth daily. 08/22/20   [name], DO  fluticasone (FLONASE) 50 MCG/ACT nasal spray Place 2 sprays into both nostrils daily. 11/08/20   Isla Pence, MD  hydrocortisone (ANUSOL-HC) 25 MG suppository Place 1 suppository (25 mg total) rectally 2 (two) times daily for 14 days, THEN 1 suppository (25 mg total) at bedtime for 14 days. 10/22/20 11/19/20  Esterwood, [name] S, PA-C  hydrocortisone cream 1 % Apply 1 application topically 2 (two) times daily. 03/27/20   Gwenlyn Perking, FNP  hyoscyamine (LEVSIN SL) 0.125 MG SL tablet Place 1 tablet (0.125 mg total) under the tongue every 6 (six) hours as needed. 09/25/20   Esterwood, [name] S, PA-C  ibandronate (BONIVA) 150 MG tablet Take 1 tablet (150 mg total) by mouth every 30 (thirty) days. Take in the morning with a full glass of water, on an empty stomach, and do not take anything else by mouth or lie down for the next 30 min. 12/11/19   [name], DO  levocetirizine  (XYZAL) 5 MG tablet Take 1 tablet (5 mg total) by mouth every evening. For allergies 08/22/20   [name], DO  linaclotide [facility]) 145 MCG CAPS capsule Take 1 capsule (145 mcg total) by mouth daily before breakfast. 02/11/20   [name], DO  meloxicam (MOBIC) 15 MG tablet Take 1 tablet (15 mg total) by mouth daily as needed for pain. 11/08/20   Isla Pence, MD  metoCLOPramide (REGLAN) 5 MG tablet Take 1 tablet (5 mg total) by mouth 3 (three) times daily before meals. 07/31/20   Esterwood, [name] S, PA-C  montelukast (SINGULAIR) 10 MG tablet Take 1 tablet (10 mg total) by mouth at bedtime. 09/26/20   Gwenlyn Perking, FNP  Pancrelipase, Lip-Prot-Amyl, (ZENPEP) 25000-79000 units CPEP Take 2 capsules by mouth 3 (three) times daily with meals. Patient taking differently: Take 1 capsule by mouth 2 (two) times daily with a meal. 07/31/20 09/05/20  Esterwood, [name] S, PA-C  predniSONE (STERAPRED UNI-PAK 21 TAB) 10 MG (21) TBPK tablet Take by mouth daily. Take 6 tabs by mouth daily  for 2 days, then 5 tabs for 2 days, then 4 tabs for 2 days, then 3 tabs for 2 days, 2 tabs for 2 days, then 1 tab by mouth daily for 2 days 11/08/20   Isla Pence, MD  Probiotic Product (ALIGN) 4 MG CAPS Take 1 capsule by mouth daily.    [provider]  psyllium (METAMUCIL) 58.6 % packet Take 1 packet by mouth daily.    [provider]  Rectal Protectant-Emollient (CALMOL-4) 76-10 % SUPP Place 1 suppository rectally as needed (for hemmorrhoids). 03/27/20   Gwenlyn Perking, FNP  Simethicone (PHAZYME MAXIMUM STRENGTH) 250 MG CAPS Take 250 mg by mouth 3 (three) times daily. Patient taking differently: Take 250 mg by mouth 3 (three) times daily as needed (gas relief). 02/20/16   Terald Sleeper, PA-C  triamcinolone (NASACORT) 55 MCG/ACT AERO nasal inhaler Place 2 sprays into the nose daily. 09/12/20   [name], DO  valACYclovir (VALTREX) 1000 MG tablet TAKE 1 TABLET (1,000 MG TOTAL) BY MOUTH  AT BEDTIME. 07/21/20   Hassell Done, Mary-Margaret, FNP  AMBULATORY NON FORMULARY MEDICATION Medication Name: Domperidone 10 mg Take 1 tablet at bedtime daily. Patient taking differently: Take 1 tablet by mouth at bedtime. Medication Name: Domperidone 10 mg Take 1 tablet at bedtime daily. 09/08/16  05/15/19  Esterwood, [name] S, PA-C    Allergies    Chlordiazepoxide-clidinium, Ciprofloxacin, Flagyl [metronidazole], Sulfa drugs cross reactors, Cefdinir, and Nitrofurantoin monohyd macro  Review of Systems   Review of Systems  Genitourinary:  Positive for dysuria and frequency.  Neurological:  Positive for weakness.  All other systems reviewed and are negative.  Physical Exam Updated Vital Signs BP 135/81   Pulse 71   Temp 97.9 F (36.6 C) (Oral)   Resp 12   Ht 5' (1.524 m)   Wt 47.6 kg   SpO2 98%   BMI 20.51 kg/m   Physical Exam Vitals and nursing note reviewed.  Constitutional:      Appearance: Normal appearance.  HENT:     Head: Normocephalic and atraumatic.     Right Ear: External ear normal.     Left Ear: External ear normal.     Nose: Nose normal.     Mouth/Throat:     Mouth: Mucous membranes are dry.  Eyes:     Extraocular Movements: Extraocular movements intact.     Conjunctiva/sclera: Conjunctivae normal.     Pupils: Pupils are equal, round, and reactive to light.  Cardiovascular:     Rate and Rhythm: Normal rate and regular rhythm.     Pulses: Normal pulses.     Heart sounds: Normal heart sounds.  Pulmonary:     Effort: Pulmonary effort is normal.     Breath sounds: Normal breath sounds.  Abdominal:     General: Abdomen is flat. Bowel sounds are normal.     Palpations: Abdomen is soft.  Musculoskeletal:        General: Normal range of motion.     Cervical back: Normal range of motion and neck supple.  Skin:    General: Skin is warm.     Capillary Refill: Capillary refill takes less than 2 seconds.  Neurological:     General: No focal deficit present.     Mental  Status: She is alert and oriented to person, place, and time.  Psychiatric:        Mood and Affect: Mood normal.        Behavior: Behavior normal.    ED Results / Procedures / Treatments   Labs (all labs ordered are listed, but only abnormal results are displayed) Labs Reviewed  BASIC METABOLIC PANEL - Abnormal; Notable for the following components:      Result Value   BUN 6 (*)    All other components within normal limits  URINALYSIS, ROUTINE W REFLEX MICROSCOPIC - Abnormal; Notable for the following components:   Specific Gravity, Urine 1.001 (*)    All other components within normal limits  RESP PANEL BY RT-PCR (FLU A&B, COVID) [id number]  RESPIRATORY PANEL BY PCR  CBC    EKG EKG Interpretation  Date/Time:  Saturday November 08 2020 12:56:45 EDT Ventricular Rate:  89 PR Interval:  126 QRS Duration: 72 QT Interval:  344 QTC Calculation: 418 R Axis:   47 Text Interpretation: Normal sinus rhythm Normal ECG No significant change since last tracing Confirmed by Isla Pence [phone]) on 11/08/2020 4:03:13 PM  Radiology CT Renal Stone Study  Result Date: 11/08/2020 CLINICAL DATA:  Bilateral flank pain for 5 days. Kidney stone suspected. Patient recently diagnosed with UTI. EXAM: CT ABDOMEN AND PELVIS WITHOUT CONTRAST TECHNIQUE: Multidetector CT imaging of the abdomen and pelvis was performed following the standard protocol without IV contrast. COMPARISON:  September 05, 2020 FINDINGS: Lower chest: 4 mm nodule in the  right lower lobe adjacent to the fissure on series 4, image 6. This region was not included on previous studies. No other abnormalities in the lung bases. Hepatobiliary: No focal liver abnormality is seen. No gallstones, gallbladder wall thickening, or biliary dilatation. Pancreas: Unremarkable. No pancreatic ductal dilatation or surrounding inflammatory changes. Spleen: Normal in size without focal abnormality. Adrenals/Urinary Tract: Adrenal glands are unremarkable. Kidneys  are normal, without renal calculi, focal lesion, or hydronephrosis. Bladder is unremarkable. Stomach/Bowel: The wall of the gastric fundus, cardia, and proximal body appear somewhat thickened. The remainder of the stomach is normal. Small bowel is normal. And appendix are unremarkable. Vascular/Lymphatic: Calcified atherosclerosis in the nonaneurysmal aorta. No adenopathy. Reproductive: Uterus and bilateral adnexa are unremarkable. Other: No free air free fluid. A fat containing umbilical hernia is identified. Musculoskeletal: No acute or significant osseous findings. IMPRESSION: 1. 4 mm nodule in the right lower lobe adjacent to the fissure on series 4, image 6. This region was not included on previous studies. No follow-up needed if patient is low-risk. Non-contrast chest CT can be considered in 12 months if patient is high-risk. This recommendation follows the consensus statement: Guidelines for Management of Incidental Pulmonary Nodules Detected on CT Images: From the Fleischner Society 2017; Radiology 2017; 284:228-243. 2. The wall of the gastric fundus, cardia, and proximal body appear thickened. While this could be due to lack of distention, the finding is similar since previous studies raising the possibility of tree thickening. Endoscopy or upper GI could better evaluate. Recommend clinical correlation. 3. Calcified atherosclerosis in the nonaneurysmal abdominal aorta. 4. Fat containing umbilical hernia. 5. No other abnormalities. Specifically, no renal stones or obstruction. Electronically Signed   By: Dorise Bullion III M.D.   On: 11/08/2020 17:35    Procedures Procedures   Medications Ordered in ED Medications  sodium chloride 0.9 % bolus 1,000 mL (0 mLs Intravenous Stopped 11/08/20 1829)  predniSONE (DELTASONE) tablet 60 mg (60 mg Oral Given 11/08/20 1841)    ED Course  I have reviewed the triage vital signs and the nursing notes.  Pertinent labs & imaging results that were available during  my care of the patient were reviewed by me and considered in my medical decision making (see chart for details).    MDM Rules/Calculators/A&P                           Pt's covid/flu negative.  Pt has been exposed to RSV, so she may have that.  RVP sent.  Pt has sinus congestion and fluid behind her ears.  She will be d/c with flonase.  She also has a lot of myalgias.  I think prednisone will help with that.    Pt does not have a UTI.  She has no kidney stone.  Gastric fundus shows some thickening.  No pain there.  Probably from lack of distention.  Pt has not had anything to eat or drink for several hours.  Pt is stable for d/c.  Return if worse.  F/u with pcp.  [name] was evaluated in Emergency Department on 11/08/2020 for the symptoms described in the history of present illness. She was evaluated in the context of the global COVID-19 pandemic, which necessitated consideration that the patient might be at risk for infection with the SARS-CoV-2 virus that causes COVID-19. Institutional protocols and algorithms that pertain to the evaluation of patients at risk for COVID-19 are in a state of rapid change based on  information released by regulatory bodies including the CDC and federal and state organizations. These policies and algorithms were followed during the patient's care in the ED.  Final Clinical Impression(s) / ED Diagnoses Final diagnoses:  Viral upper respiratory tract infection  Flank pain    Rx / DC Orders ED Discharge Orders          Ordered    predniSONE (STERAPRED UNI-PAK 21 TAB) 10 MG (21) TBPK tablet  Daily,   Status:  Discontinued        11/08/20 1823    fluticasone (FLONASE) 50 MCG/ACT nasal spray  Daily,   Status:  Discontinued        11/08/20 1823    meloxicam (MOBIC) 15 MG tablet  Daily PRN,   Status:  Discontinued        11/08/20 1824    fluticasone (FLONASE) 50 MCG/ACT nasal spray  Daily        11/08/20 1848    meloxicam (MOBIC) 15 MG tablet  Daily  PRN        11/08/20 1848    predniSONE (STERAPRED UNI-PAK 21 TAB) 10 MG (21) TBPK tablet  Daily        11/08/20 1848             Isla Pence, MD 11/08/20 Glorianne Manchester    Isla Pence, MD 11/08/20 2005

## 2020-11-08 NOTE — ED Provider Notes (Signed)
Emergency Medicine Provider Triage Evaluation Note  Ruth Terry , a 65 y.o. female  was evaluated in triage.  Pt complains of burning with urination.  Pt reports she has low back pain.  Pt feels like she has a uti.  Review of Systems  Positive: myalgias Negative: fever  Physical Exam  BP 129/84 (BP Location: Right Arm)   Pulse 93   Temp 97.9 F (36.6 C) (Oral)   Resp 16   Ht 5' (1.524 m)   Wt 47.6 kg   SpO2 97%   BMI 20.51 kg/m  Gen:   Awake, no distress   Resp:  Normal effort  MSK:   Moves extremities without difficulty  Other:    Medical Decision Making  Medically screening exam initiated at 1:34 PM.  Appropriate orders placed.  [name] was informed that the remainder of the evaluation will be completed by another provider, this initial triage assessment does not replace that evaluation, and the importance of remaining in the ED until their evaluation is complete.     Fransico Meadow, [place] 11/08/20 1337    Milton Ferguson, MD 11/11/20 [phone]

## 2020-11-08 NOTE — ED Notes (Signed)
Patient transported to CT 

## 2020-11-08 NOTE — ED Triage Notes (Signed)
Pt to the ED with complaints of dizziness that began today.  Pt has also been recently diagnosed with a UTI.

## 2020-11-10 ENCOUNTER — Telehealth: Payer: Self-pay | Admitting: Family Medicine

## 2020-11-10 NOTE — Telephone Encounter (Signed)
Please call patient to schedule ER Follow Up with Dr Lajuana Ripple.

## 2020-11-10 NOTE — Telephone Encounter (Signed)
Inbound call from patient returning call. 

## 2020-11-10 NOTE — Telephone Encounter (Signed)
Pt aware of provider feedback and voiced understanding. 

## 2020-11-10 NOTE — Telephone Encounter (Signed)
Called patient and gave Nicoletta Ba PA recommendations

## 2020-11-10 NOTE — Telephone Encounter (Signed)
Spoke with patient and scheduled her for an ER follow up with Sharyn Lull on Wednesday, you do not have anything available. She is concerned about the amount of Prednisone she is taking and wanted me to ask you what  you think about this.  Tried to explain that this was a normal amount but she is worried and feels it is worsening her constipation.

## 2020-11-10 NOTE — Telephone Encounter (Signed)
Called patient back, and she states: 1- she has finished the 14 days of Anusol suppositories but is still having rectal pain (they helped some) wants to know if she should continue. 2- she could not find the [employer] for internal application anywhere in her area (only external application). 3- Reglan is not helping with her gastroparesis and she would like to know if there is something else she can try. 4 - She is on a high dose of Prednisone, to treat a respiratory infx. And it is causing sever constipation. States she can't take Miralax because it dehydrates her too much, wants to know what else she can take. 5 - She wants to discuss the CT results further. Please advise.

## 2020-11-10 NOTE — Telephone Encounter (Signed)
Normal dosepak

## 2020-11-12 ENCOUNTER — Telehealth: Payer: Self-pay | Admitting: Family Medicine

## 2020-11-12 NOTE — Telephone Encounter (Signed)
Pt wants to know if she can cut down on how much Prednisone she is taking. Pt says she is taking 40 right now but wants to take less than that if possible.  Please advise and call patient.

## 2020-11-13 ENCOUNTER — Other Ambulatory Visit: Payer: Self-pay

## 2020-11-13 ENCOUNTER — Encounter: Payer: Self-pay | Admitting: Family Medicine

## 2020-11-13 ENCOUNTER — Ambulatory Visit (INDEPENDENT_AMBULATORY_CARE_PROVIDER_SITE_OTHER): Payer: 59 | Admitting: Family Medicine

## 2020-11-13 VITALS — BP 142/87 | HR 93 | Temp 97.4°F | Ht 60.0 in | Wt 101.4 lb

## 2020-11-13 DIAGNOSIS — G9332 Myalgic encephalomyelitis/chronic fatigue syndrome: Secondary | ICD-10-CM | POA: Diagnosis not present

## 2020-11-13 DIAGNOSIS — R300 Dysuria: Secondary | ICD-10-CM | POA: Diagnosis not present

## 2020-11-13 DIAGNOSIS — R4189 Other symptoms and signs involving cognitive functions and awareness: Secondary | ICD-10-CM

## 2020-11-13 DIAGNOSIS — R350 Frequency of micturition: Secondary | ICD-10-CM | POA: Diagnosis not present

## 2020-11-13 DIAGNOSIS — U099 Post covid-19 condition, unspecified: Secondary | ICD-10-CM

## 2020-11-13 LAB — URINALYSIS, ROUTINE W REFLEX MICROSCOPIC
Bilirubin, UA: NEGATIVE
Glucose, UA: NEGATIVE
Ketones, UA: NEGATIVE
Leukocytes,UA: NEGATIVE
Nitrite, UA: NEGATIVE
Protein,UA: NEGATIVE
RBC, UA: NEGATIVE
Specific Gravity, UA: 1.01 (ref 1.005–1.030)
Urobilinogen, Ur: 0.2 mg/dL (ref 0.2–1.0)
pH, UA: 6.5 (ref 5.0–7.5)

## 2020-11-13 LAB — MICROSCOPIC EXAMINATION
Bacteria, UA: NONE SEEN
Epithelial Cells (non renal): NONE SEEN /hpf (ref 0–10)
RBC, Urine: NONE SEEN /hpf (ref 0–2)
Renal Epithel, UA: NONE SEEN /hpf
WBC, UA: NONE SEEN /hpf (ref 0–5)

## 2020-11-13 NOTE — Progress Notes (Signed)
Subjective:  Patient ID: Ruth Terry, female    DOB: [birth year], 65 y.o.   MRN: [id number]  Patient Care Team: Raliegh Ip, DO as PCP - General (Family Medicine) Danella Maiers, [facility] (Pharmacist) Peterson Ao as Physician Assistant (Gastroenterology) Eldred Manges, MD as Consulting Physician (Orthopedic Surgery)   Chief Complaint:  Follow-up (ED f/u  )   HPI: Ruth Terry is a 65 y.o. female presenting on 11/13/2020 for Follow-up (ED f/u  )  Patient presents today for follow-up after recent ED visit for upper respiratory tract infection.  Her respiratory panel was negative.  CT renal study revealed a 4 mm lung nodule which was recommended for follow-up scan in 1 year, otherwise unremarkable.  She was discharged home with Flonase, steroid taper pack, and fluticasone nasal spray.  States since having COVID she has continued fatigue, brain fog, head fullness, and malaise.  Labs during recent ED visit revealed normal CBC and BMP.  No recent thyroid studies have been completed. She also reports ongoing urinary frequency and dysuria.  No fever, chills, weakness, or flank pain.  Relevant past medical, surgical, family, and social history reviewed and updated as indicated.  Allergies and medications reviewed and updated. Data reviewed: Chart in Epic.   Past Medical History:  Diagnosis Date   Anal fissure    Chronic constipation    Diverticulosis 04-20-10   colonoscopy   Gastroparesis    GERD (gastroesophageal reflux disease)    Hemorrhoids    Hyperlipidemia    IBS (irritable bowel syndrome)    MVP (mitral valve prolapse)    Polyp, stomach 04-20-10   egd   Shingles    UTI (lower urinary tract infection)     Past Surgical History:  Procedure Laterality Date   COLONOSCOPY     ESOPHAGOGASTRODUODENOSCOPY      Social History   Socioeconomic History   Marital status: Married    Spouse name: Not on file   Number of children: 0   Years of education: Not  on file   Highest education level: Not on file  Occupational History   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    Employer: [employer]  Tobacco Use   Smoking status: Former    Packs/day: 0.50    Years: 6.00    Pack years: 3.00    Types: Cigarettes   Smokeless tobacco: Never   Tobacco comments:    USE SMOKELESS CIGARETTES  Vaping Use   Vaping Use: Every day   Substances: Nicotine  Substance and Sexual Activity   Alcohol use: Yes    Alcohol/week: 0.0 standard drinks    Comment: 1 a week, wine   Drug use: No   Sexual activity: Not on file  Other Topics Concern   Not on file  Social History Narrative   Not on file   Social Determinants of Health   Financial Resource Strain: Not on file  Food Insecurity: Not on file  Transportation Needs: Not on file  Physical Activity: Not on file  Stress: Not on file  Social Connections: Not on file  Intimate Partner Violence: Not on file    Outpatient Encounter Medications as of 11/13/2020  Medication Sig   Cholecalciferol (VITAMIN D-3) 25 MCG (1000 UT) CAPS Take 1 capsule by mouth daily.   DEXILANT 60 MG capsule Take 1 capsule (60 mg total) by mouth daily. Symptoms refractory to Prevacid and Omeprazole.  New PA  needed for dexilant. May need to submit as brand   DULoxetine (CYMBALTA) 60 MG capsule Take 1 capsule (60 mg total) by mouth daily.   fluticasone (FLONASE) 50 MCG/ACT nasal spray Place 2 sprays into both nostrils daily.   hydrocortisone (ANUSOL-HC) 25 MG suppository Place 1 suppository (25 mg total) rectally 2 (two) times daily for 14 days, THEN 1 suppository (25 mg total) at bedtime for 14 days.   hydrocortisone cream 1 % Apply 1 application topically 2 (two) times daily.   hyoscyamine (LEVSIN SL) 0.125 MG SL tablet Place 1 tablet (0.125 mg total) under the tongue every 6 (six) hours as needed.   ibandronate (BONIVA) 150 MG tablet Take 1 tablet (150 mg total) by mouth every 30 (thirty)  days. Take in the morning with a full glass of water, on an empty stomach, and do not take anything else by mouth or lie down for the next 30 min.   levocetirizine (XYZAL) 5 MG tablet Take 1 tablet (5 mg total) by mouth every evening. For allergies   linaclotide (LINZESS) 145 MCG CAPS capsule Take 1 capsule (145 mcg total) by mouth daily before breakfast.   meloxicam (MOBIC) 15 MG tablet Take 1 tablet (15 mg total) by mouth daily as needed for pain.   metoCLOPramide (REGLAN) 5 MG tablet Take 1 tablet (5 mg total) by mouth 3 (three) times daily before meals.   montelukast (SINGULAIR) 10 MG tablet Take 1 tablet (10 mg total) by mouth at bedtime.   Probiotic Product (ALIGN) 4 MG CAPS Take 1 capsule by mouth daily.   psyllium (METAMUCIL) 58.6 % packet Take 1 packet by mouth daily.   Rectal Protectant-Emollient (CALMOL-4) 76-10 % SUPP Place 1 suppository rectally as needed (for hemmorrhoids).   Simethicone (PHAZYME MAXIMUM STRENGTH) 250 MG CAPS Take 250 mg by mouth 3 (three) times daily. (Patient taking differently: Take 250 mg by mouth 3 (three) times daily as needed (gas relief).)   triamcinolone (NASACORT) 55 MCG/ACT AERO nasal inhaler Place 2 sprays into the nose daily.   valACYclovir (VALTREX) 1000 MG tablet TAKE 1 TABLET (1,000 MG TOTAL) BY MOUTH AT BEDTIME.   [DISCONTINUED] predniSONE (STERAPRED UNI-PAK 21 TAB) 10 MG (21) TBPK tablet Take by mouth daily. Take 6 tabs by mouth daily  for 2 days, then 5 tabs for 2 days, then 4 tabs for 2 days, then 3 tabs for 2 days, 2 tabs for 2 days, then 1 tab by mouth daily for 2 days   Pancrelipase, Lip-Prot-Amyl, (ZENPEP) 25000-79000 units CPEP Take 2 capsules by mouth 3 (three) times daily with meals. (Patient taking differently: Take 1 capsule by mouth 2 (two) times daily with a meal.)   [DISCONTINUED] AMBULATORY NON FORMULARY MEDICATION Medication Name: Domperidone 10 mg Take 1 tablet at bedtime daily. (Patient taking differently: Take 1 tablet by mouth at  bedtime. Medication Name: Domperidone 10 mg Take 1 tablet at bedtime daily.)   Facility-Administered Encounter Medications as of 11/13/2020  Medication   methylPREDNISolone acetate (DEPO-MEDROL) injection 40 mg    Allergies  Allergen Reactions   Chlordiazepoxide-Clidinium Itching   Ciprofloxacin Itching    Irritates stomach   Flagyl [Metronidazole]     Severe yeast infection   Sulfa Drugs Cross Reactors Nausea Only   Cefdinir Nausea And Vomiting   Nitrofurantoin Monohyd Macro Rash    Review of Systems  Constitutional:  Positive for activity change, appetite change and fatigue. Negative for chills, diaphoresis, fever and unexpected weight change.  Respiratory:  Negative for cough  and shortness of breath.   Cardiovascular:  Negative for chest pain, palpitations and leg swelling.  Gastrointestinal:  Negative for abdominal pain.  Genitourinary:  Positive for dysuria and frequency. Negative for decreased urine volume, difficulty urinating, dyspareunia, flank pain, hematuria, urgency, vaginal bleeding, vaginal discharge and vaginal pain.  Neurological:  Positive for headaches. Negative for dizziness, tremors, seizures, syncope, facial asymmetry, speech difficulty, weakness, light-headedness and numbness.  Psychiatric/Behavioral:  Negative for confusion.   All other systems reviewed and are negative.      Objective:  BP (!) 142/87   Pulse 93   Temp (!) 97.4 F (36.3 C)   Ht 5' (1.524 m)   Wt 101 lb 6.4 oz (46 kg)   SpO2 98%   BMI 19.80 kg/m    Wt Readings from Last 3 Encounters:  11/13/20 101 lb 6.4 oz (46 kg)  11/08/20 105 lb (47.6 kg)  10/22/20 104 lb 4 oz (47.3 kg)    Physical Exam Vitals and nursing note reviewed.  Constitutional:      General: She is not in acute distress.    Appearance: Normal appearance. She is well-developed and well-groomed. She is not ill-appearing, toxic-appearing or diaphoretic.  HENT:     Head: Normocephalic and atraumatic.     Jaw: There  is normal jaw occlusion.     Right Ear: Hearing, tympanic membrane, ear canal and external ear normal.     Left Ear: Hearing, tympanic membrane, ear canal and external ear normal.     Nose: Nose normal.     Mouth/Throat:     Lips: Pink.     Mouth: Mucous membranes are moist.     Pharynx: Oropharynx is clear. Uvula midline. No oropharyngeal exudate or posterior oropharyngeal erythema.  Eyes:     General: Lids are normal.     Extraocular Movements: Extraocular movements intact.     Conjunctiva/sclera: Conjunctivae normal.     Pupils: Pupils are equal, round, and reactive to light.  Neck:     Thyroid: No thyroid mass, thyromegaly or thyroid tenderness.     Vascular: No carotid bruit or JVD.     Trachea: Trachea and phonation normal.  Cardiovascular:     Rate and Rhythm: Normal rate and regular rhythm.     Chest Wall: PMI is not displaced.     Pulses: Normal pulses.     Heart sounds: Normal heart sounds. No murmur heard.   No friction rub. No gallop.  Pulmonary:     Effort: Pulmonary effort is normal. No respiratory distress.     Breath sounds: Normal breath sounds. No wheezing.  Abdominal:     General: Bowel sounds are normal. There is no distension or abdominal bruit.     Palpations: Abdomen is soft. There is no hepatomegaly, splenomegaly or mass.     Tenderness: There is no abdominal tenderness. There is no right CVA tenderness, left CVA tenderness, guarding or rebound.     Hernia: No hernia is present.  Musculoskeletal:        General: Normal range of motion.     Cervical back: Normal range of motion and neck supple.     Right lower leg: No edema.     Left lower leg: No edema.  Lymphadenopathy:     Cervical: No cervical adenopathy.  Skin:    General: Skin is warm and dry.     Capillary Refill: Capillary refill takes less than 2 seconds.     Coloration: Skin is not cyanotic, jaundiced or  pale.     Findings: No rash.  Neurological:     General: No focal deficit present.      Mental Status: She is alert and oriented to person, place, and time.     Sensory: Sensation is intact.     Motor: Motor function is intact.     Coordination: Coordination is intact.     Gait: Gait is intact.     Deep Tendon Reflexes: Reflexes are normal and symmetric.  Psychiatric:        Attention and Perception: Attention and perception normal.        Mood and Affect: Mood and affect normal.        Speech: Speech normal.        Behavior: Behavior normal. Behavior is cooperative.        Thought Content: Thought content normal.        Cognition and Memory: Cognition and memory normal.        Judgment: Judgment normal.    Results for orders placed or performed during the hospital encounter of 11/08/20  Respiratory (~20 pathogens) panel by PCR  Result Value Ref Range   Adenovirus NOT DETECTED NOT DETECTED   Coronavirus 229E NOT DETECTED NOT DETECTED   Coronavirus HKU1 NOT DETECTED NOT DETECTED   Coronavirus NL63 NOT DETECTED NOT DETECTED   Coronavirus OC43 NOT DETECTED NOT DETECTED   Metapneumovirus NOT DETECTED NOT DETECTED   Rhinovirus / Enterovirus NOT DETECTED NOT DETECTED   Influenza A NOT DETECTED NOT DETECTED   Influenza B NOT DETECTED NOT DETECTED   Parainfluenza Virus 1 NOT DETECTED NOT DETECTED   Parainfluenza Virus 2 NOT DETECTED NOT DETECTED   Parainfluenza Virus 3 NOT DETECTED NOT DETECTED   Parainfluenza Virus 4 NOT DETECTED NOT DETECTED   Respiratory Syncytial Virus NOT DETECTED NOT DETECTED   Bordetella pertussis NOT DETECTED NOT DETECTED   Bordetella Parapertussis NOT DETECTED NOT DETECTED   Chlamydophila pneumoniae NOT DETECTED NOT DETECTED   Mycoplasma pneumoniae NOT DETECTED NOT DETECTED  Resp Panel by RT-PCR (Flu A&B, Covid)  Result Value Ref Range   SARS Coronavirus 2 by RT PCR NEGATIVE NEGATIVE   Influenza A by PCR NEGATIVE NEGATIVE   Influenza B by PCR NEGATIVE NEGATIVE  Basic metabolic panel  Result Value Ref Range   Sodium 141 135 - 145 mmol/L    Potassium 3.5 3.5 - 5.1 mmol/L   Chloride 105 98 - 111 mmol/L   CO2 29 22 - 32 mmol/L   Glucose, Bld 87 70 - 99 mg/dL   BUN 6 (L) 8 - 23 mg/dL   Creatinine, Ser 1.30 0.44 - 1.00 mg/dL   Calcium 9.8 8.9 - 86.5 mg/dL   GFR, Estimated >78 >46 mL/min   Anion gap 7 5 - 15  CBC  Result Value Ref Range   WBC 5.5 4.0 - 10.5 K/uL   RBC 4.82 3.87 - 5.11 MIL/uL   Hemoglobin 14.8 12.0 - 15.0 g/dL   HCT 96.2 95.2 - 84.1 %   MCV 92.1 80.0 - 100.0 fL   MCH 30.7 26.0 - 34.0 pg   MCHC 33.3 30.0 - 36.0 g/dL   RDW 32.4 40.1 - 02.7 %   Platelets 243 150 - 400 K/uL   nRBC 0.0 0.0 - 0.2 %  Urinalysis, Routine w reflex microscopic Urine, Clean Catch  Result Value Ref Range   Color, Urine YELLOW YELLOW   APPearance CLEAR CLEAR   Specific Gravity, Urine 1.001 (L) 1.005 - 1.030  pH 6.0 5.0 - 8.0   Glucose, UA NEGATIVE NEGATIVE mg/dL   Hgb urine dipstick NEGATIVE NEGATIVE   Bilirubin Urine NEGATIVE NEGATIVE   Ketones, ur NEGATIVE NEGATIVE mg/dL   Protein, ur NEGATIVE NEGATIVE mg/dL   Nitrite NEGATIVE NEGATIVE   Leukocytes,Ua NEGATIVE NEGATIVE       Pertinent labs & imaging results that were available during my care of the patient were reviewed by me and considered in my medical decision making.  Assessment & Plan:  Ferrah was seen today for follow-up.  Diagnoses and all orders for this visit:  Dysuria Frequency of urination Urinalysis and culture pending, will treat if warranted.  Symptomatic relief discussed in detail.  Patient aware to avoid bladder irritants such as caffeine. -     Urinalysis, Routine w reflex microscopic -     Urine Culture  COVID-19 long hauler manifesting chronic fatigue Brain fog Ongoing symptoms post COVID illness.  Symptomatic care and recovery discussed in detail.  Will check thyroid function due to ongoing fatigue and brain fog.  Patient aware to take good multivitamin daily.  Patient aware of the importance of adequate diet and exercise as well.  Patient to  follow-up with PCP as needed. -     Thyroid Panel With TSH     Continue all other maintenance medications.  Follow up plan: Return if symptoms worsen or fail to improve.   Continue healthy lifestyle choices, including diet (rich in fruits, vegetables, and lean proteins, and low in salt and simple carbohydrates) and exercise (at least 30 minutes of moderate physical activity daily).   The above assessment and management plan was discussed with the patient. The patient verbalized understanding of and has agreed to the management plan. Patient is aware to call the clinic if they develop any new symptoms or if symptoms persist or worsen. Patient is aware when to return to the clinic for a follow-up visit. Patient educated on when it is appropriate to go to the emergency department.   Kari Baars, FNP-C [facility] [phone]

## 2020-11-14 LAB — THYROID PANEL WITH TSH
Free Thyroxine Index: 3.1 (ref 1.2–4.9)
T3 Uptake Ratio: 30 % (ref 24–39)
T4, Total: 10.3 ug/dL (ref 4.5–12.0)
TSH: 1.17 u[IU]/mL (ref 0.450–4.500)

## 2020-11-19 ENCOUNTER — Ambulatory Visit: Payer: 59 | Admitting: Family Medicine

## 2020-11-19 LAB — URINE CULTURE

## 2020-11-20 ENCOUNTER — Other Ambulatory Visit: Payer: Self-pay

## 2020-11-20 ENCOUNTER — Encounter: Payer: Self-pay | Admitting: Family Medicine

## 2020-11-20 ENCOUNTER — Ambulatory Visit: Payer: 59 | Admitting: Family Medicine

## 2020-11-20 VITALS — BP 136/72 | HR 110 | Temp 98.1°F | Ht 60.0 in | Wt 101.0 lb

## 2020-11-20 DIAGNOSIS — K648 Other hemorrhoids: Secondary | ICD-10-CM | POA: Diagnosis not present

## 2020-11-20 NOTE — Patient Instructions (Signed)
Follow up with GI for ongoing constipation and rectal pain with hemorrhoids.

## 2020-11-20 NOTE — Progress Notes (Signed)
Subjective:  Patient ID: Ruth Terry, female    DOB: [birth year], 65 y.o.   MRN: [id number]  Patient Care Team: Raliegh Ip, DO as PCP - General (Family Medicine) Danella Maiers, [facility] (Pharmacist) Peterson Ao as Physician Assistant (Gastroenterology) Eldred Manges, MD as Consulting Physician (Orthopedic Surgery)   Chief Complaint:  Hemorrhoids (Throbbing pain, rule out anal fissure)   HPI: Ruth Terry is a 65 y.o. female presenting on 11/20/2020 for Hemorrhoids (Throbbing pain, rule out anal fissure)   Pt presents today for evaluation of continued rectal pain related to hemorrhoids. She was evaluated by GI and placed on Anusol for internal hemorrhoid. She is scheduled for endoscopy and colonoscopy 12/09/2020. She states she feels she has a developing fissure or a mass on her external rectum. She denies bleeding. She does struggle with constipation. No other reported symptoms.    Relevant past medical, surgical, family, and social history reviewed and updated as indicated.  Allergies and medications reviewed and updated. Data reviewed: Chart in Epic.   Past Medical History:  Diagnosis Date   Anal fissure    Chronic constipation    Diverticulosis 04-20-10   colonoscopy   Gastroparesis    GERD (gastroesophageal reflux disease)    Hemorrhoids    Hyperlipidemia    IBS (irritable bowel syndrome)    MVP (mitral valve prolapse)    Polyp, stomach 04-20-10   egd   Shingles    UTI (lower urinary tract infection)     Past Surgical History:  Procedure Laterality Date   COLONOSCOPY     ESOPHAGOGASTRODUODENOSCOPY      Social History   Socioeconomic History   Marital status: Married    Spouse name: Not on file   Number of children: 0   Years of education: Not on file   Highest education level: Not on file  Occupational History   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    Employer: [employer]  Tobacco Use    Smoking status: Former    Packs/day: 0.50    Years: 6.00    Pack years: 3.00    Types: Cigarettes   Smokeless tobacco: Never   Tobacco comments:    USE SMOKELESS CIGARETTES  Vaping Use   Vaping Use: Every day   Substances: Nicotine  Substance and Sexual Activity   Alcohol use: Yes    Alcohol/week: 0.0 standard drinks    Comment: 1 a week, wine   Drug use: No   Sexual activity: Not on file  Other Topics Concern   Not on file  Social History Narrative   Not on file   Social Determinants of Health   Financial Resource Strain: Not on file  Food Insecurity: Not on file  Transportation Needs: Not on file  Physical Activity: Not on file  Stress: Not on file  Social Connections: Not on file  Intimate Partner Violence: Not on file    Outpatient Encounter Medications as of 11/20/2020  Medication Sig   Cholecalciferol (VITAMIN D-3) 25 MCG (1000 UT) CAPS Take 1 capsule by mouth daily.   DEXILANT 60 MG capsule Take 1 capsule (60 mg total) by mouth daily. Symptoms refractory to Prevacid and Omeprazole.  New PA needed for dexilant. May need to submit as brand   DULoxetine (CYMBALTA) 60 MG capsule Take 1 capsule (60 mg total) by mouth daily.   fluticasone (FLONASE) 50 MCG/ACT nasal spray Place 2 sprays into both  nostrils daily.   hydrocortisone cream 1 % Apply 1 application topically 2 (two) times daily.   hyoscyamine (LEVSIN SL) 0.125 MG SL tablet Place 1 tablet (0.125 mg total) under the tongue every 6 (six) hours as needed.   ibandronate (BONIVA) 150 MG tablet Take 1 tablet (150 mg total) by mouth every 30 (thirty) days. Take in the morning with a full glass of water, on an empty stomach, and do not take anything else by mouth or lie down for the next 30 min.   levocetirizine (XYZAL) 5 MG tablet Take 1 tablet (5 mg total) by mouth every evening. For allergies   linaclotide (LINZESS) 145 MCG CAPS capsule Take 1 capsule (145 mcg total) by mouth daily before breakfast.   meloxicam (MOBIC)  15 MG tablet Take 1 tablet (15 mg total) by mouth daily as needed for pain.   metoCLOPramide (REGLAN) 5 MG tablet Take 1 tablet (5 mg total) by mouth 3 (three) times daily before meals.   montelukast (SINGULAIR) 10 MG tablet Take 1 tablet (10 mg total) by mouth at bedtime.   Probiotic Product (ALIGN) 4 MG CAPS Take 1 capsule by mouth daily.   psyllium (METAMUCIL) 58.6 % packet Take 1 packet by mouth daily.   Rectal Protectant-Emollient (CALMOL-4) 76-10 % SUPP Place 1 suppository rectally as needed (for hemmorrhoids).   Simethicone (PHAZYME MAXIMUM STRENGTH) 250 MG CAPS Take 250 mg by mouth 3 (three) times daily. (Patient taking differently: Take 250 mg by mouth 3 (three) times daily as needed (gas relief).)   triamcinolone (NASACORT) 55 MCG/ACT AERO nasal inhaler Place 2 sprays into the nose daily.   valACYclovir (VALTREX) 1000 MG tablet TAKE 1 TABLET (1,000 MG TOTAL) BY MOUTH AT BEDTIME.   Pancrelipase, Lip-Prot-Amyl, (ZENPEP) 25000-79000 units CPEP Take 2 capsules by mouth 3 (three) times daily with meals. (Patient taking differently: Take 1 capsule by mouth 2 (two) times daily with a meal.)   [DISCONTINUED] AMBULATORY NON FORMULARY MEDICATION Medication Name: Domperidone 10 mg Take 1 tablet at bedtime daily. (Patient taking differently: Take 1 tablet by mouth at bedtime. Medication Name: Domperidone 10 mg Take 1 tablet at bedtime daily.)   Facility-Administered Encounter Medications as of 11/20/2020  Medication   methylPREDNISolone acetate (DEPO-MEDROL) injection 40 mg    Allergies  Allergen Reactions   Chlordiazepoxide-Clidinium Itching   Ciprofloxacin Itching    Irritates stomach   Flagyl [Metronidazole]     Severe yeast infection   Sulfa Drugs Cross Reactors Nausea Only   Cefdinir Nausea And Vomiting   Nitrofurantoin Monohyd Macro Rash    Review of Systems  Constitutional:  Negative for activity change, appetite change, chills, diaphoresis, fatigue, fever and unexpected weight  change.  HENT: Negative.    Eyes: Negative.   Respiratory:  Negative for cough, chest tightness and shortness of breath.   Cardiovascular:  Negative for chest pain, palpitations and leg swelling.  Gastrointestinal:  Positive for constipation and rectal pain. Negative for abdominal distention, abdominal pain, anal bleeding, blood in stool, diarrhea, nausea and vomiting.  Endocrine: Negative.   Genitourinary:  Negative for decreased urine volume, difficulty urinating, dysuria, frequency and urgency.  Musculoskeletal:  Negative for arthralgias and myalgias.  Skin: Negative.   Allergic/Immunologic: Negative.   Neurological:  Negative for dizziness, weakness and headaches.  Hematological: Negative.   Psychiatric/Behavioral:  Negative for confusion, hallucinations, sleep disturbance and suicidal ideas.   All other systems reviewed and are negative.      Objective:  BP 136/72   Pulse Marland Kitchen)  110   Temp 98.1 F (36.7 C)   Ht 5' (1.524 m)   Wt 101 lb (45.8 kg)   SpO2 99%   BMI 19.73 kg/m    Wt Readings from Last 3 Encounters:  11/20/20 101 lb (45.8 kg)  11/13/20 101 lb 6.4 oz (46 kg)  11/08/20 105 lb (47.6 kg)    Physical Exam Vitals and nursing note reviewed.  Constitutional:      General: She is not in acute distress.    Appearance: Normal appearance. She is well-developed, well-groomed and normal weight. She is not ill-appearing, toxic-appearing or diaphoretic.  HENT:     Head: Normocephalic and atraumatic.     Jaw: There is normal jaw occlusion.     Right Ear: Hearing normal.     Left Ear: Hearing normal.     Nose: Nose normal.     Mouth/Throat:     Lips: Pink.     Mouth: Mucous membranes are moist.     Pharynx: Oropharynx is clear. Uvula midline.  Eyes:     General: Lids are normal.     Extraocular Movements: Extraocular movements intact.     Conjunctiva/sclera: Conjunctivae normal.     Pupils: Pupils are equal, round, and reactive to light.  Neck:     Thyroid: No  thyroid mass, thyromegaly or thyroid tenderness.     Vascular: No carotid bruit or JVD.     Trachea: Trachea and phonation normal.  Cardiovascular:     Rate and Rhythm: Normal rate and regular rhythm.     Chest Wall: PMI is not displaced.     Pulses: Normal pulses.     Heart sounds: Normal heart sounds. No murmur heard.   No friction rub. No gallop.  Pulmonary:     Effort: Pulmonary effort is normal. No respiratory distress.     Breath sounds: Normal breath sounds. No wheezing.  Abdominal:     General: Bowel sounds are normal. There is no distension or abdominal bruit.     Palpations: Abdomen is soft. There is no hepatomegaly or splenomegaly.     Tenderness: There is no abdominal tenderness. There is no right CVA tenderness or left CVA tenderness.     Hernia: No hernia is present.  Genitourinary:    Exam position: Knee-chest position.     Rectum: Tenderness and internal hemorrhoid present. No mass, anal fissure or external hemorrhoid. Normal anal tone.  Musculoskeletal:        General: Normal range of motion.     Cervical back: Normal range of motion and neck supple.     Right lower leg: No edema.     Left lower leg: No edema.  Lymphadenopathy:     Cervical: No cervical adenopathy.  Skin:    General: Skin is warm and dry.     Capillary Refill: Capillary refill takes less than 2 seconds.     Coloration: Skin is not cyanotic, jaundiced or pale.     Findings: No rash.  Neurological:     General: No focal deficit present.     Mental Status: She is alert and oriented to person, place, and time.     Sensory: Sensation is intact.     Motor: Motor function is intact.     Coordination: Coordination is intact.     Gait: Gait is intact.     Deep Tendon Reflexes: Reflexes are normal and symmetric.  Psychiatric:        Attention and Perception: Attention and perception normal.  Mood and Affect: Mood and affect normal.        Speech: Speech normal.        Behavior: Behavior  normal. Behavior is cooperative.        Thought Content: Thought content normal.        Cognition and Memory: Cognition and memory normal.        Judgment: Judgment normal.    Results for orders placed or performed in visit on 11/13/20  Urine Culture   Specimen: Urine   UR  Result Value Ref Range   Urine Culture, Routine Final report    Organism ID, Bacteria Comment   Microscopic Examination   Urine  Result Value Ref Range   WBC, UA None seen 0 - 5 /hpf   RBC None seen 0 - 2 /hpf   Epithelial Cells (non renal) None seen 0 - 10 /hpf   Renal Epithel, UA None seen None seen /hpf   Bacteria, UA None seen None seen/Few  Urinalysis, Routine w reflex microscopic  Result Value Ref Range   Specific Gravity, UA 1.010 1.005 - 1.030   pH, UA 6.5 5.0 - 7.5   Color, UA Yellow Yellow   Appearance Ur Clear Clear   Leukocytes,UA Negative Negative   Protein,UA Negative Negative/Trace   Glucose, UA Negative Negative   Ketones, UA Negative Negative   RBC, UA Negative Negative   Bilirubin, UA Negative Negative   Urobilinogen, Ur 0.2 0.2 - 1.0 mg/dL   Nitrite, UA Negative Negative   Microscopic Examination See below:   Thyroid Panel With TSH  Result Value Ref Range   TSH 1.170 0.450 - 4.500 uIU/mL   T4, Total 10.3 4.5 - 12.0 ug/dL   T3 Uptake Ratio 30 24 - 39 %   Free Thyroxine Index 3.1 1.2 - 4.9       Pertinent labs & imaging results that were available during my care of the patient were reviewed by me and considered in my medical decision making.  Assessment & Plan:  Ahmaria was seen today for hemorrhoids.  Diagnoses and all orders for this visit:  Internal hemorrhoids Symptomatic care discussed in detail. Pt has endoscopy and colonoscopy scheduled with GI. Pt aware to follow up with GI for new, worsening, or persistent symptoms. Educated on high fiber intake and adequate water intake.    Continue all other maintenance medications.  Follow up plan: Return if symptoms worsen or  fail to improve.   Continue healthy lifestyle choices, including diet (rich in fruits, vegetables, and lean proteins, and low in salt and simple carbohydrates) and exercise (at least 30 minutes of moderate physical activity daily).  Educational handout given for hemorrhoids   The above assessment and management plan was discussed with the patient. The patient verbalized understanding of and has agreed to the management plan. Patient is aware to call the clinic if they develop any new symptoms or if symptoms persist or worsen. Patient is aware when to return to the clinic for a follow-up visit. Patient educated on when it is appropriate to go to the emergency department.   Kari Baars, FNP-C [facility] [phone]

## 2020-11-24 ENCOUNTER — Telehealth: Payer: Self-pay | Admitting: Physician Assistant

## 2020-11-24 NOTE — Telephone Encounter (Signed)
Patient called requesting a call back from the nurse.  She is having extreme issues with constipation, gastritis, and hemorrhoids and had a terrible weekend.  Please call and advise.  Thank you.

## 2020-11-25 NOTE — Telephone Encounter (Signed)
Spoke with the patient. She is using Prep H suppositories and the cream for her hemorrhoids which is making her more comfortable. She reports extreme burning and itching with this. She was on prednisone and she became constipated. She is passing only small amounts of hard dry stool. Declines Miralax purge or taking Miralax at all. "It dehydrates me and I land in the Emergency Room."  States she cannot take Reglan. After taking it for about a week, she becomes nauseated and she has to stop taking it. She has been out of work all last week and this week due to these problems. "I just can't go to work, I am so weak." Denies fever, chills rectal bleeding or vomiting.

## 2020-11-25 NOTE — Telephone Encounter (Signed)
Patient is instructed. 

## 2020-11-25 NOTE — Telephone Encounter (Signed)
Patient is calling back to follow up on previous call. Requested a call back before the weekend said she is having a lot of issues and is seeking help.

## 2020-12-02 ENCOUNTER — Ambulatory Visit: Payer: 59 | Admitting: Family Medicine

## 2020-12-02 ENCOUNTER — Encounter: Payer: Self-pay | Admitting: Family Medicine

## 2020-12-02 VITALS — BP 131/84 | HR 107 | Ht 60.0 in | Wt 100.0 lb

## 2020-12-02 DIAGNOSIS — K648 Other hemorrhoids: Secondary | ICD-10-CM | POA: Diagnosis not present

## 2020-12-02 NOTE — Patient Instructions (Signed)
See GI for ongoing symptoms.

## 2020-12-02 NOTE — Progress Notes (Signed)
Subjective:  Patient ID: Ruth Terry, female    DOB: 09/16/1955, 65 y.o.   MRN: [id number]  Patient Care Team: Raliegh Ip, DO as PCP - General (Family Medicine) Danella Maiers, [facility] (Pharmacist) Peterson Ao as Physician Assistant (Gastroenterology) Eldred Manges, MD as Consulting Physician (Orthopedic Surgery)   Chief Complaint:  GI Problem   HPI: Ruth Terry is a 65 y.o. female presenting on 12/02/2020 for GI Problem   Pt presents today with complaints of worsening hemorrhoid pain. States something changed  over the holiday. States it felt like something dropped in her rectum and this has caused increased pressure and trouble defecating. She is followed by GI but has not called the office. She is scheduled for a colonoscopy next week.   GI Problem The primary symptoms include abdominal pain. Primary symptoms do not include fever, weight loss, fatigue, nausea, vomiting, diarrhea, melena, hematemesis, jaundice, hematochezia, dysuria, myalgias, arthralgias or rash. Primary symptoms comment: hemorrhoid pain. The illness began more than 7 days ago.  The illness is also significant for bloating and constipation. The illness does not include chills, anorexia, dysphagia, odynophagia, tenesmus, back pain or itching. Significant associated medical issues include irritable bowel syndrome and hemorrhoids.    Relevant past medical, surgical, family, and social history reviewed and updated as indicated.  Allergies and medications reviewed and updated. Data reviewed: Chart in Epic.   Past Medical History:  Diagnosis Date   Anal fissure    Chronic constipation    Diverticulosis 04-20-10   colonoscopy   Gastroparesis    GERD (gastroesophageal reflux disease)    Hemorrhoids    Hyperlipidemia    IBS (irritable bowel syndrome)    MVP (mitral valve prolapse)    Polyp, stomach 04-20-10   egd   Shingles    UTI (lower urinary tract infection)     Past Surgical  History:  Procedure Laterality Date   COLONOSCOPY     ESOPHAGOGASTRODUODENOSCOPY      Social History   Socioeconomic History   Marital status: Married    Spouse name: Not on file   Number of children: 0   Years of education: Not on file   Highest education level: Not on file  Occupational History   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    Employer: [employer]  Tobacco Use   Smoking status: Former    Packs/day: 0.50    Years: 6.00    Pack years: 3.00    Types: Cigarettes   Smokeless tobacco: Never   Tobacco comments:    USE SMOKELESS CIGARETTES  Vaping Use   Vaping Use: Every day   Substances: Nicotine  Substance and Sexual Activity   Alcohol use: Yes    Alcohol/week: 0.0 standard drinks    Comment: 1 a week, wine   Drug use: No   Sexual activity: Not on file  Other Topics Concern   Not on file  Social History Narrative   Not on file   Social Determinants of Health   Financial Resource Strain: Not on file  Food Insecurity: Not on file  Transportation Needs: Not on file  Physical Activity: Not on file  Stress: Not on file  Social Connections: Not on file  Intimate Partner Violence: Not on file    Outpatient Encounter Medications as of 12/02/2020  Medication Sig   Cholecalciferol (VITAMIN D-3) 25 MCG (1000 UT) CAPS Take 1 capsule by mouth daily.  DEXILANT 60 MG capsule Take 1 capsule (60 mg total) by mouth daily. Symptoms refractory to Prevacid and Omeprazole.  New PA needed for dexilant. May need to submit as brand   DULoxetine (CYMBALTA) 60 MG capsule Take 1 capsule (60 mg total) by mouth daily.   fluticasone (FLONASE) 50 MCG/ACT nasal spray Place 2 sprays into both nostrils daily.   hydrocortisone cream 1 % Apply 1 application topically 2 (two) times daily.   hyoscyamine (LEVSIN SL) 0.125 MG SL tablet Place 1 tablet (0.125 mg total) under the tongue every 6 (six) hours as needed.   ibandronate (BONIVA) 150 MG tablet  Take 1 tablet (150 mg total) by mouth every 30 (thirty) days. Take in the morning with a full glass of water, on an empty stomach, and do not take anything else by mouth or lie down for the next 30 min.   levocetirizine (XYZAL) 5 MG tablet Take 1 tablet (5 mg total) by mouth every evening. For allergies   linaclotide (LINZESS) 145 MCG CAPS capsule Take 1 capsule (145 mcg total) by mouth daily before breakfast.   meloxicam (MOBIC) 15 MG tablet Take 1 tablet (15 mg total) by mouth daily as needed for pain.   metoCLOPramide (REGLAN) 5 MG tablet Take 1 tablet (5 mg total) by mouth 3 (three) times daily before meals.   montelukast (SINGULAIR) 10 MG tablet Take 1 tablet (10 mg total) by mouth at bedtime.   Pancrelipase, Lip-Prot-Amyl, (ZENPEP) 25000-79000 units CPEP Take 2 capsules by mouth 3 (three) times daily with meals. (Patient taking differently: Take 1 capsule by mouth 2 (two) times daily with a meal.)   Probiotic Product (ALIGN) 4 MG CAPS Take 1 capsule by mouth daily.   psyllium (METAMUCIL) 58.6 % packet Take 1 packet by mouth daily.   Rectal Protectant-Emollient (CALMOL-4) 76-10 % SUPP Place 1 suppository rectally as needed (for hemmorrhoids).   Simethicone (PHAZYME MAXIMUM STRENGTH) 250 MG CAPS Take 250 mg by mouth 3 (three) times daily. (Patient taking differently: Take 250 mg by mouth 3 (three) times daily as needed (gas relief).)   triamcinolone (NASACORT) 55 MCG/ACT AERO nasal inhaler Place 2 sprays into the nose daily.   valACYclovir (VALTREX) 1000 MG tablet TAKE 1 TABLET (1,000 MG TOTAL) BY MOUTH AT BEDTIME.   [DISCONTINUED] AMBULATORY NON FORMULARY MEDICATION Medication Name: Domperidone 10 mg Take 1 tablet at bedtime daily. (Patient taking differently: Take 1 tablet by mouth at bedtime. Medication Name: Domperidone 10 mg Take 1 tablet at bedtime daily.)   Facility-Administered Encounter Medications as of 12/02/2020  Medication   methylPREDNISolone acetate (DEPO-MEDROL) injection 40 mg     Allergies  Allergen Reactions   Chlordiazepoxide-Clidinium Itching   Ciprofloxacin Itching    Irritates stomach   Flagyl [Metronidazole]     Severe yeast infection   Sulfa Drugs Cross Reactors Nausea Only   Cefdinir Nausea And Vomiting   Nitrofurantoin Monohyd Macro Rash    Review of Systems  Constitutional:  Negative for activity change, appetite change, chills, diaphoresis, fatigue, fever, unexpected weight change and weight loss.  Gastrointestinal:  Positive for abdominal pain, bloating, constipation and rectal pain. Negative for abdominal distention, anal bleeding, anorexia, blood in stool, diarrhea, dysphagia, hematemesis, hematochezia, jaundice, melena, nausea and vomiting.  Genitourinary:  Negative for decreased urine volume and dysuria.  Musculoskeletal:  Negative for arthralgias, back pain and myalgias.  Skin:  Negative for itching and rash.  Neurological:  Negative for weakness.  Psychiatric/Behavioral:  Negative for confusion.   All other systems  reviewed and are negative.      Objective:  BP 131/84   Pulse (!) 107   Ht 5' (1.524 m)   Wt 100 lb (45.4 kg)   SpO2 97%   BMI 19.53 kg/m    Wt Readings from Last 3 Encounters:  12/02/20 100 lb (45.4 kg)  11/20/20 101 lb (45.8 kg)  11/13/20 101 lb 6.4 oz (46 kg)    Physical Exam Vitals and nursing note reviewed.  Constitutional:      General: She is not in acute distress.    Appearance: Normal appearance. She is well-developed and well-groomed. She is not ill-appearing, toxic-appearing or diaphoretic.  HENT:     Head: Normocephalic and atraumatic.     Jaw: There is normal jaw occlusion.     Right Ear: Hearing normal.     Left Ear: Hearing normal.     Nose: Nose normal.     Mouth/Throat:     Lips: Pink.     Mouth: Mucous membranes are moist.     Pharynx: Oropharynx is clear. Uvula midline.  Eyes:     General: Lids are normal.     Extraocular Movements: Extraocular movements intact.      Conjunctiva/sclera: Conjunctivae normal.     Pupils: Pupils are equal, round, and reactive to light.  Neck:     Thyroid: No thyroid mass, thyromegaly or thyroid tenderness.     Vascular: No carotid bruit or JVD.     Trachea: Trachea and phonation normal.  Cardiovascular:     Rate and Rhythm: Normal rate and regular rhythm.     Chest Wall: PMI is not displaced.     Pulses: Normal pulses.     Heart sounds: Normal heart sounds. No murmur heard.   No friction rub. No gallop.  Pulmonary:     Effort: Pulmonary effort is normal. No respiratory distress.     Breath sounds: Normal breath sounds. No wheezing.  Abdominal:     General: Bowel sounds are normal. There is no distension or abdominal bruit.     Palpations: Abdomen is soft. There is no hepatomegaly, splenomegaly or mass.     Tenderness: There is no abdominal tenderness. There is no right CVA tenderness, left CVA tenderness, guarding or rebound.     Hernia: No hernia is present.  Genitourinary:    Exam position: Knee-chest position.     Rectum: Guaiac result negative. Tenderness, external hemorrhoid and internal hemorrhoid present. No mass or anal fissure. Normal anal tone.  Musculoskeletal:        General: Normal range of motion.     Cervical back: Normal range of motion and neck supple.     Right lower leg: No edema.     Left lower leg: No edema.  Lymphadenopathy:     Cervical: No cervical adenopathy.  Skin:    General: Skin is warm and dry.     Capillary Refill: Capillary refill takes less than 2 seconds.     Coloration: Skin is not cyanotic, jaundiced or pale.     Findings: No rash.  Neurological:     General: No focal deficit present.     Mental Status: She is alert and oriented to person, place, and time.     Sensory: Sensation is intact.     Motor: Motor function is intact.     Coordination: Coordination is intact.     Gait: Gait is intact.     Deep Tendon Reflexes: Reflexes are normal and symmetric.  Psychiatric:  Attention and Perception: Attention and perception normal.        Mood and Affect: Mood and affect normal.        Speech: Speech normal.        Behavior: Behavior normal. Behavior is cooperative.        Thought Content: Thought content normal.        Cognition and Memory: Cognition and memory normal.        Judgment: Judgment normal.    Results for orders placed or performed in visit on 11/13/20  Urine Culture   Specimen: Urine   UR  Result Value Ref Range   Urine Culture, Routine Final report    Organism ID, Bacteria Comment   Microscopic Examination   Urine  Result Value Ref Range   WBC, UA None seen 0 - 5 /hpf   RBC None seen 0 - 2 /hpf   Epithelial Cells (non renal) None seen 0 - 10 /hpf   Renal Epithel, UA None seen None seen /hpf   Bacteria, UA None seen None seen/Few  Urinalysis, Routine w reflex microscopic  Result Value Ref Range   Specific Gravity, UA 1.010 1.005 - 1.030   pH, UA 6.5 5.0 - 7.5   Color, UA Yellow Yellow   Appearance Ur Clear Clear   Leukocytes,UA Negative Negative   Protein,UA Negative Negative/Trace   Glucose, UA Negative Negative   Ketones, UA Negative Negative   RBC, UA Negative Negative   Bilirubin, UA Negative Negative   Urobilinogen, Ur 0.2 0.2 - 1.0 mg/dL   Nitrite, UA Negative Negative   Microscopic Examination See below:   Thyroid Panel With TSH  Result Value Ref Range   TSH 1.170 0.450 - 4.500 uIU/mL   T4, Total 10.3 4.5 - 12.0 ug/dL   T3 Uptake Ratio 30 24 - 39 %   Free Thyroxine Index 3.1 1.2 - 4.9       Pertinent labs & imaging results that were available during my care of the patient were reviewed by me and considered in my medical decision making.  Assessment & Plan:  Manika was seen today for gi problem.  Diagnoses and all orders for this visit:  Internal hemorrhoids Pt was in office last week for same. No significant change in physical exam. No thrombosed hemorrhoids noted. Pt has colonoscopy scheduled next week.  Pt aware to call GI for persistent or worsening symptoms. Aware to drink plenty of water and to have adequate fiber intake. Continue stool softeners. Follow up with GI as discussed.    Continue all other maintenance medications.  Follow up plan: Return if symptoms worsen or fail to improve.   Continue healthy lifestyle choices, including diet (rich in fruits, vegetables, and lean proteins, and low in salt and simple carbohydrates) and exercise (at least 30 minutes of moderate physical activity daily).  Educational handout given for hemorrhoids  The above assessment and management plan was discussed with the patient. The patient verbalized understanding of and has agreed to the management plan. Patient is aware to call the clinic if they develop any new symptoms or if symptoms persist or worsen. Patient is aware when to return to the clinic for a follow-up visit. Patient educated on when it is appropriate to go to the emergency department.   Kari Baars, FNP-C [facility] [phone]

## 2020-12-04 ENCOUNTER — Ambulatory Visit: Payer: 59 | Admitting: Nurse Practitioner

## 2020-12-04 ENCOUNTER — Encounter: Payer: Self-pay | Admitting: Nurse Practitioner

## 2020-12-04 VITALS — BP 139/85 | HR 101 | Temp 97.6°F | Resp 20 | Ht 60.0 in | Wt 100.0 lb

## 2020-12-04 DIAGNOSIS — R52 Pain, unspecified: Secondary | ICD-10-CM

## 2020-12-04 DIAGNOSIS — J069 Acute upper respiratory infection, unspecified: Secondary | ICD-10-CM | POA: Diagnosis not present

## 2020-12-04 LAB — VERITOR FLU A/B WAIVED
Influenza A: NEGATIVE
Influenza B: NEGATIVE

## 2020-12-04 MED ORDER — NAPROXEN 500 MG PO TABS: 500.0000 mg | ORAL_TABLET | Freq: Two times a day (BID) | ORAL | 1 refills | Status: DC

## 2020-12-04 NOTE — Progress Notes (Signed)
Subjective:    Patient ID: Ruth Terry, female    DOB: [birth year], 65 y.o.   MRN: [id number]  Chief Complaint: Generalized Body Aches, Headache, and Chills   HPI Patient comes in today because body aches, headache and chills. Denis fever. This started all the sudden yesterday.    Review of Systems  Constitutional:  Positive for chills and fatigue. Negative for fever.  HENT:  Positive for congestion and ear pain (right). Negative for sinus pressure and sore throat.   Respiratory:  Negative for cough.   Musculoskeletal:  Positive for myalgias.  Neurological:  Positive for headaches.      Objective:   Physical Exam Vitals and nursing note reviewed.  Constitutional:      General: She is not in acute distress.    Appearance: Normal appearance. She is well-developed.  HENT:     Head: Normocephalic.     Right Ear: Tympanic membrane normal.     Left Ear: Tympanic membrane normal.     Nose: Nose normal.     Mouth/Throat:     Mouth: Mucous membranes are moist.  Eyes:     Pupils: Pupils are equal, round, and reactive to light.  Neck:     Vascular: No carotid bruit or JVD.  Cardiovascular:     Rate and Rhythm: Normal rate and regular rhythm.     Heart sounds: Normal heart sounds.  Pulmonary:     Effort: Pulmonary effort is normal. No respiratory distress.     Breath sounds: Normal breath sounds. No wheezing or rales.  Chest:     Chest wall: No tenderness.  Abdominal:     General: Bowel sounds are normal. There is no distension or abdominal bruit.     Palpations: Abdomen is soft. There is no hepatomegaly, splenomegaly, mass or pulsatile mass.     Tenderness: There is no abdominal tenderness.  Musculoskeletal:        General: Normal range of motion.     Cervical back: Normal range of motion and neck supple.  Lymphadenopathy:     Cervical: No cervical adenopathy.  Skin:    General: Skin is warm and dry.  Neurological:     Mental Status: She is alert and oriented to  person, place, and time.     Deep Tendon Reflexes: Reflexes are normal and symmetric.  Psychiatric:        Behavior: Behavior normal.        Thought Content: Thought content normal.        Judgment: Judgment normal.   BP 139/85   Pulse (!) 101   Temp 97.6 F (36.4 C) (Temporal)   Resp 20   Ht 5' (1.524 m)   Wt 100 lb (45.4 kg)   SpO2 100%   BMI 19.53 kg/m   Flu negative         Assessment & Plan:  Ruth Terry in today with chief complaint of Generalized Body Aches, Headache, and Chills   1. Body aches - Veritor Flu A/B Waived - Novel Coronavirus, NAA ([facility])  2. Viral URI 1. Take meds as prescribed 2. Use a cool mist humidifier especially during the winter months and when heat has been humid. 3. Use saline nose sprays frequently 4. Saline irrigations of the nose can be very helpful if done frequently.  * 4X daily for 1 week*  * Use of a nettie pot can be helpful with this. Follow directions with this* 5. Drink plenty of fluids 6. Keep  thermostat turn down low 7.For any cough or congestion  Use plain Mucinex- regular strength or max strength is fine   * Children- consult with Pharmacist for dosing 8. For fever or aces or pains- take tylenol or ibuprofen appropriate for age and weight.  * for fevers greater than 101 orally you may alternate ibuprofen and tylenol every  3 hours.   Meds ordered this encounter  Medications   naproxen (NAPROSYN) 500 MG tablet    Sig: Take 1 tablet (500 mg total) by mouth 2 (two) times daily with a meal.    Dispense:  60 tablet    Refill:  1    Order Specific Question:   Supervising Provider    Answer:   Arville Care A [[id number]]       The above assessment and management plan was discussed with the patient. The patient verbalized understanding of and has agreed to the management plan. Patient is aware to call the clinic if symptoms persist or worsen. Patient is aware when to return to the clinic for a follow-up visit.  Patient educated on when it is appropriate to go to the emergency department.   Mary-Margaret Daphine Deutscher, FNP

## 2020-12-04 NOTE — Patient Instructions (Signed)

## 2020-12-05 ENCOUNTER — Telehealth: Payer: Self-pay | Admitting: Physician Assistant

## 2020-12-05 LAB — NOVEL CORONAVIRUS, NAA: SARS-CoV-2, NAA: NOT DETECTED

## 2020-12-05 NOTE — Telephone Encounter (Signed)
Inbound call from patient requesting to speak with a nurse in regards to some of the medications she is taking please.

## 2020-12-08 NOTE — Telephone Encounter (Signed)
Pt just wanted to know if she can take her regular medications before the procedure. Advise pt that she can take her medications as long as they are not Diabetic or Blood thinner medications. Pt verbalized understanding and had no further concerns.

## 2020-12-08 NOTE — Telephone Encounter (Signed)
Patient following up about medications to take before upcoming procedure 12/6

## 2020-12-09 ENCOUNTER — Ambulatory Visit (AMBULATORY_SURGERY_CENTER): Payer: 59 | Admitting: Internal Medicine

## 2020-12-09 ENCOUNTER — Encounter: Payer: Self-pay | Admitting: Internal Medicine

## 2020-12-09 VITALS — BP 128/73 | HR 82 | Temp 96.8°F | Resp 22 | Ht 60.0 in | Wt 100.0 lb

## 2020-12-09 DIAGNOSIS — K219 Gastro-esophageal reflux disease without esophagitis: Secondary | ICD-10-CM | POA: Diagnosis not present

## 2020-12-09 DIAGNOSIS — R935 Abnormal findings on diagnostic imaging of other abdominal regions, including retroperitoneum: Secondary | ICD-10-CM | POA: Diagnosis not present

## 2020-12-09 DIAGNOSIS — Z1211 Encounter for screening for malignant neoplasm of colon: Secondary | ICD-10-CM

## 2020-12-09 DIAGNOSIS — R1013 Epigastric pain: Secondary | ICD-10-CM

## 2020-12-09 DIAGNOSIS — K3184 Gastroparesis: Secondary | ICD-10-CM

## 2020-12-09 DIAGNOSIS — R1310 Dysphagia, unspecified: Secondary | ICD-10-CM

## 2020-12-09 DIAGNOSIS — K582 Mixed irritable bowel syndrome: Secondary | ICD-10-CM

## 2020-12-09 MED ORDER — SODIUM CHLORIDE 0.9 % IV SOLN: 500.0000 mL | Freq: Once | INTRAVENOUS | Status: DC

## 2020-12-09 NOTE — Op Note (Signed)
[facility] Patient Name: Ruth Terry Procedure Date: 12/09/2020 3:39 PM MRN: [id number] Endoscopist: Wilhemina Bonito. Marina Goodell , MD Age: 66 Referring MD:  Date of Birth: 1955-03-06 Gender: Female Account #: [id number] Procedure:                Upper GI endoscopy Indications:              Dyspepsia, Esophageal reflux, CT with questioning                            of gastric thickening Medicines:                Monitored Anesthesia Care Procedure:                Pre-Anesthesia Assessment:                           - Prior to the procedure, a History and Physical                            was performed, and patient medications and                            allergies were reviewed. The patient's tolerance of                            previous anesthesia was also reviewed. The risks                            and benefits of the procedure and the sedation                            options and risks were discussed with the patient.                            All questions were answered, and informed consent                            was obtained. Prior Anticoagulants: The patient has                            taken no previous anticoagulant or antiplatelet                            agents. ASA Grade Assessment: II - A patient with                            mild systemic disease. After reviewing the risks                            and benefits, the patient was deemed in                            satisfactory condition to undergo the procedure.  After obtaining informed consent, the endoscope was                            passed under direct vision. Throughout the                            procedure, the patient's blood pressure, pulse, and                            oxygen saturations were monitored continuously. The                            Endoscope was introduced through the mouth, and                            advanced to the second part of  duodenum. The upper                            GI endoscopy was accomplished without difficulty.                            The patient tolerated the procedure well. Scope In: Scope Out: Findings:                 The esophagus was normal.                           The stomach was normal.                           The examined duodenum was normal.                           The cardia and gastric fundus were normal on                            retroflexion. Complications:            No immediate complications. Estimated Blood Loss:     Estimated blood loss: none. Impression:               - Normal esophagus.                           - Normal stomach.                           - Normal examined duodenum.                           - No specimens collected. Recommendation:           - Patient has a contact number available for                            emergencies. The signs and symptoms of potential  delayed complications were discussed with the                            patient. Return to normal activities tomorrow.                            Written discharge instructions were provided to the                            patient.                           - Resume previous diet.                           - Continue present medications. Wilhemina Bonito. Marina Goodell, MD 12/09/2020 4:10:35 PM This report has been signed electronically.

## 2020-12-09 NOTE — Patient Instructions (Signed)
YOU HAD AN ENDOSCOPIC PROCEDURE TODAY: Refer to the procedure report and other information in the discharge instructions given to you for any specific questions about what was found during the examination. If this information does not answer your questions, please call [place] office at [phone] to clarify.  ° °YOU SHOULD EXPECT: Some feelings of bloating in the abdomen. Passage of more gas than usual. Walking can help get rid of the air that was put into your GI tract during the procedure and reduce the bloating. If you had a lower endoscopy (such as a colonoscopy or flexible sigmoidoscopy) you may notice spotting of blood in your stool or on the toilet paper. Some abdominal soreness may be present for a day or two, also. ° °DIET: Your first meal following the procedure should be a light meal and then it is ok to progress to your normal diet. A half-sandwich or bowl of soup is an example of a good first meal. Heavy or fried foods are harder to digest and may make you feel nauseous or bloated. Drink plenty of fluids but you should avoid alcoholic beverages for 24 hours. If you had a esophageal dilation, please see attached instructions for diet.   ° °ACTIVITY: Your care partner should take you home directly after the procedure. You should plan to take it easy, moving slowly for the rest of the day. You can resume normal activity the day after the procedure however YOU SHOULD NOT DRIVE, use power tools, machinery or perform tasks that involve climbing or major physical exertion for 24 hours (because of the sedation medicines used during the test).  ° °SYMPTOMS TO REPORT IMMEDIATELY: °A gastroenterologist can be reached at any hour. Please call [phone]  for any of the following symptoms:  °Following lower endoscopy (colonoscopy, flexible sigmoidoscopy) °Excessive amounts of blood in the stool  °Significant tenderness, worsening of abdominal pains  °Swelling of the abdomen that is new, acute  °Fever of 100° or  higher  °Following upper endoscopy (EGD, EUS, ERCP, esophageal dilation) °Vomiting of blood or coffee ground material  °New, significant abdominal pain  °New, significant chest pain or pain under the shoulder blades  °Painful or persistently difficult swallowing  °New shortness of breath  °Black, tarry-looking or red, bloody stools ° °FOLLOW UP:  °If any biopsies were taken you will be contacted by phone or by letter within the next 1-3 weeks. Call [phone]  if you have not heard about the biopsies in 3 weeks.  °Please also call with any specific questions about appointments or follow up tests. ° °

## 2020-12-09 NOTE — Op Note (Signed)
[facility] Patient Name: Ruth Terry Procedure Date: 12/09/2020 3:39 PM MRN: [id number] Endoscopist: Wilhemina Bonito. Marina Goodell , MD Age: 65 Referring MD:  Date of Birth: 1955-07-04 Gender: Female Account #: [id number] Procedure:                Colonoscopy Indications:              Screening for colorectal malignant neoplasm Medicines:                Monitored Anesthesia Care Procedure:                Pre-Anesthesia Assessment:                           - Prior to the procedure, a History and Physical                            was performed, and patient medications and                            allergies were reviewed. The patient's tolerance of                            previous anesthesia was also reviewed. The risks                            and benefits of the procedure and the sedation                            options and risks were discussed with the patient.                            All questions were answered, and informed consent                            was obtained. Prior Anticoagulants: The patient has                            taken no previous anticoagulant or antiplatelet                            agents. ASA Grade Assessment: II - A patient with                            mild systemic disease. After reviewing the risks                            and benefits, the patient was deemed in                            satisfactory condition to undergo the procedure.                           After obtaining informed consent, the colonoscope  was passed under direct vision. Throughout the                            procedure, the patient's blood pressure, pulse, and                            oxygen saturations were monitored continuously. The                            PCF-HQ190L Colonoscope was introduced through the                            anus and advanced to the the cecum, identified by                            appendiceal  orifice and ileocecal valve. The                            ileocecal valve, appendiceal orifice, and rectum                            were photographed. The quality of the bowel                            preparation was excellent. The colonoscopy was                            performed without difficulty. The patient tolerated                            the procedure well. The bowel preparation used was                            SUPREP via split dose instruction. Scope In: 3:44:24 PM Scope Out: 3:57:50 PM Scope Withdrawal Time: 0 hours 9 minutes 49 seconds  Total Procedure Duration: 0 hours 13 minutes 26 seconds  Findings:                 A few diverticula were found in the sigmoid colon.                           Internal hemorrhoids were found during retroflexion.                           The exam was otherwise without abnormality on                            direct and retroflexion views. Complications:            No immediate complications. Estimated blood loss:                            None. Estimated Blood Loss:     Estimated blood loss: none. Impression:               -  Internal hemorrhoids. Sigmoid diverticulosis                           - The examination was otherwise normal on direct                            and retroflexion views.                           - No polyps or cancer. Recommendation:           - Repeat colonoscopy in 10 years for screening                            purposes.                           - Patient has a contact number available for                            emergencies. The signs and symptoms of potential                            delayed complications were discussed with the                            patient. Return to normal activities tomorrow.                            Written discharge instructions were provided to the                            patient.                           - Resume previous diet.                            - Continue present medications. Wilhemina Bonito. Marina Goodell, MD 12/09/2020 4:02:42 PM This report has been signed electronically.

## 2020-12-09 NOTE — Progress Notes (Signed)
Sedate, gd SR, tolerated procedure well, VSS, report to RN 

## 2020-12-09 NOTE — Progress Notes (Signed)
Vitals-DT  History reviewed. 

## 2020-12-09 NOTE — Progress Notes (Signed)
HISTORY OF PRESENT ILLNESS:  Ruth Terry is a 65 y.o. female with multiple medical problems as listed below as well as multiple chronic GI complaints.  She was seen by the GI physician assistant October 22, 2020 regarding rectal pain, constipation predominant IBS, dyspepsia.  She is now for colonoscopy and upper endoscopy  REVIEW OF SYSTEMS:  All non-GI ROS negative.  Past Medical History:  Diagnosis Date   Allergy    Anal fissure    Cataract    Chronic constipation    Diverticulosis 04/20/2010   colonoscopy   Gastroparesis    GERD (gastroesophageal reflux disease)    Hemorrhoids    Hyperlipidemia    IBS (irritable bowel syndrome)    MVP (mitral valve prolapse)    Osteoporosis    Polyp, stomach 04/20/2010   egd   Shingles    UTI (lower urinary tract infection)     Past Surgical History:  Procedure Laterality Date   COLONOSCOPY     ESOPHAGOGASTRODUODENOSCOPY      Social History Ruth Terry  reports that she has quit smoking. Her smoking use included cigarettes. She has a 3.00 pack-year smoking history. She has never used smokeless tobacco. She reports current alcohol use. She reports that she does not use drugs.  family history includes Diabetes in her father; Heart attack in her father; Hypertension in her mother.  Allergies  Allergen Reactions   Chlordiazepoxide-Clidinium Itching   Ciprofloxacin Itching    Irritates stomach   Flagyl [Metronidazole]     Severe yeast infection   Sulfa Drugs Cross Reactors Nausea Only   Cefdinir Nausea And Vomiting   Nitrofurantoin Monohyd Macro Rash       PHYSICAL EXAMINATION:  Vital signs: BP 130/80 (BP Location: Right Arm, Patient Position: Sitting, Cuff Size: Small)   Pulse (!) 105   Temp (!) 96.8 F (36 C) (Temporal)   Ht 5' (1.524 m)   Wt 100 lb (45.4 kg)   SpO2 100%   BMI 19.53 kg/m  General: Well-developed, well-nourished, no acute distress HEENT: Sclerae are anicteric, conjunctiva pink. Oral mucosa  intact Lungs: Clear Heart: Regular Abdomen: soft, nontender, nondistended, no obvious ascites, no peritoneal signs, normal bowel sounds. No organomegaly. Extremities: No edema Psychiatric: alert and oriented x3. Cooperative     Assessment & Plan:    #70 65 year old white female with multiple GI issues who comes in today with primary complaint of severe rectal pain which has been present over the past couple of weeks, described as constant throbbing burning and pressure-like. On exam and anoscopy she has an inflamed moderate sized internal hemorrhoid on the right, no anal fissure, no mass palpable She had been told by primary care that she had a pilonidal cyst when she was seen about a month ago, I see no evidence of definite pilonidal cyst on exam.  She is awaiting surgical consultation   I believe her rectal pain is secondary to inflamed internal hemorrhoid.  Consider a radicular type of pain with known L5 disc disease   #2 colon cancer surveillance-last colonoscopy 2012 due for follow-up #3 GERD stable on Dexilant #4 history of gastroparesis-currently on metoclopramide 5 mg 3 times daily AC #5 pancreatic insufficiency diagnosed with fecal elastase, continues on Zenpep, (25000-79000) 2 capsules 3 times daily #6 chronic constipation-improved on Linzess 145 mcg daily #7 chronic dyspepsia-prior Rx for SIBO #8 chronic anxiety, situational stress   Plan; start RectiCare/lidocaine 5% 4 times daily applied 1 inch into the anus Start Anusol HC suppositories  twice daily x2 weeks, then nightly x2 weeks, continue at bedtime if persistent symptoms Ice packs 2-3 times daily, continue to use doughnut when sitting Patient is asked to call back in 2 weeks if symptoms have not improved significantly could consider pelvic MRI Patient will be scheduled for colonoscopy and EGD with Dr. Henrene Pastor.  Both procedures were discussed in detail with the patient including indications risks and benefits and she is  agreeable to proceed She is asked to follow through with surgical consultation, though I did do not definitely appreciate a pilonidal cyst at present. Continue other GI regimen as outlined above

## 2020-12-11 ENCOUNTER — Telehealth: Payer: Self-pay

## 2020-12-11 ENCOUNTER — Telehealth: Payer: Self-pay | Admitting: *Deleted

## 2020-12-11 NOTE — Telephone Encounter (Signed)
  Follow up Call-  Call back number 12/09/2020  Post procedure Call Back phone  # [phone]  Permission to leave phone message Yes  Some recent data might be hidden     Patient questions:  Do you have a fever, pain , or abdominal swelling? No. Pain Score  0 *  Have you tolerated food without any problems? Yes.    Have you been able to return to your normal activities? Yes.    Do you have any questions about your discharge instructions: Diet   No. Medications  No. Follow up visit  No.  Do you have questions or concerns about your Care? No.  Actions: * If pain score is 4 or above: No action needed, pain <4.  Have you developed a fever since your procedure? no  2.   Have you had an respiratory symptoms (SOB or cough) since your procedure? no  3.   Have you tested positive for COVID 19 since your procedure no  4.   Have you had any family members/close contacts diagnosed with the COVID 19 since your procedure?  no   If yes to any of these questions please route to Joylene John, RN and Joella Prince, RN

## 2020-12-11 NOTE — Telephone Encounter (Signed)
  Follow up Call-  Call back number 12/09/2020  Post procedure Call Back phone  # [phone]  Permission to leave phone message Yes  Some recent data might be hidden     Patient questions:  Do you have a fever, pain , or abdominal swelling? No. Pain Score  0 *  Have you tolerated food without any problems? Yes.    Have you been able to return to your normal activities? Yes.    Do you have any questions about your discharge instructions: Diet   No. Medications  No. Follow up visit  No.  Do you have questions or concerns about your Care? No.  Actions: * If pain score is 4 or above: No action needed, pain <4.  Have you developed a fever since your procedure? no  2.   Have you had an respiratory symptoms (SOB or cough) since your procedure? no  3.   Have you tested positive for COVID 19 since your procedure no  4.   Have you had any family members/close contacts diagnosed with the COVID 19 since your procedure?  no   If yes to any of these questions please route to Joylene John, RN and Joella Prince, RN

## 2020-12-22 ENCOUNTER — Ambulatory Visit (INDEPENDENT_AMBULATORY_CARE_PROVIDER_SITE_OTHER): Payer: 59

## 2020-12-22 ENCOUNTER — Ambulatory Visit (INDEPENDENT_AMBULATORY_CARE_PROVIDER_SITE_OTHER): Payer: 59 | Admitting: Nurse Practitioner

## 2020-12-22 ENCOUNTER — Encounter: Payer: Self-pay | Admitting: Nurse Practitioner

## 2020-12-22 VITALS — BP 130/85 | HR 104 | Temp 97.2°F | Resp 20 | Ht 60.0 in | Wt 102.0 lb

## 2020-12-22 DIAGNOSIS — S63502A Unspecified sprain of left wrist, initial encounter: Secondary | ICD-10-CM

## 2020-12-22 DIAGNOSIS — S4992XA Unspecified injury of left shoulder and upper arm, initial encounter: Secondary | ICD-10-CM

## 2020-12-22 IMAGING — DX DG ELBOW 2V*L*
2 series · 2 of 2 positions shown · non-contrast
Comparison: None.

CLINICAL DATA: Fall and trauma to the left upper extremity.

EXAM:
LEFT WRIST - COMPLETE 3+ VIEW; LEFT ELBOW - 2 VIEW

[elbow ap]
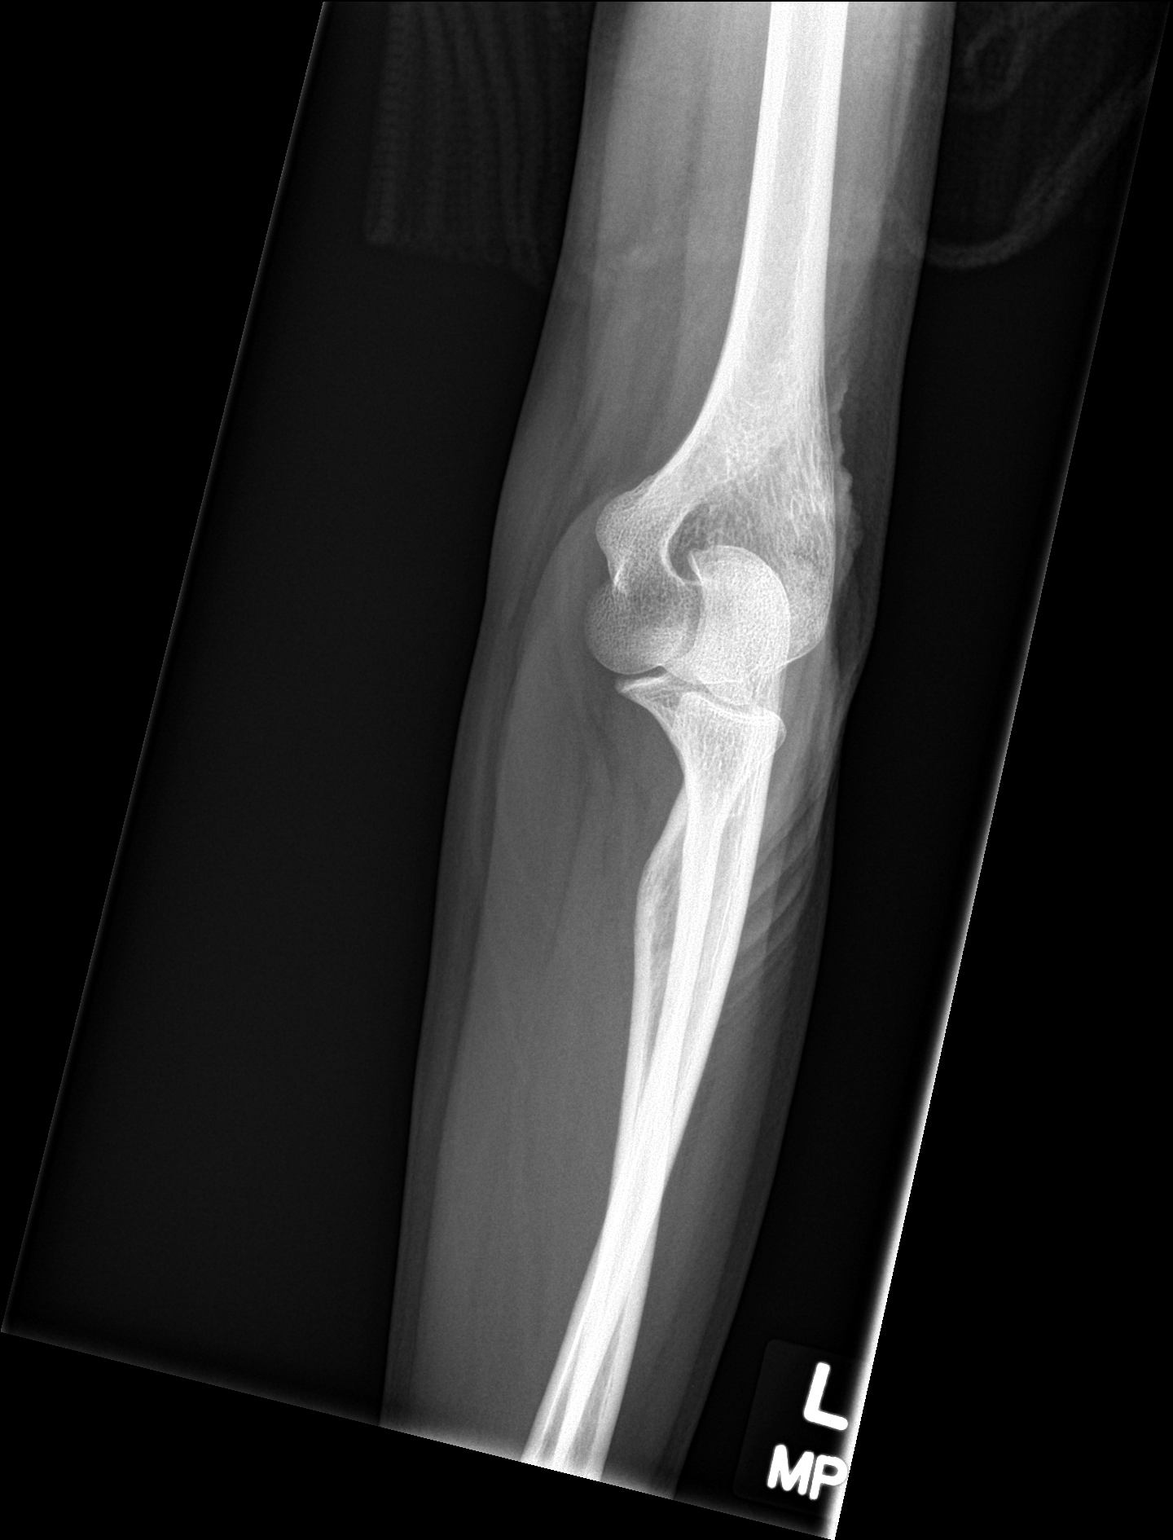

[elbow lat]
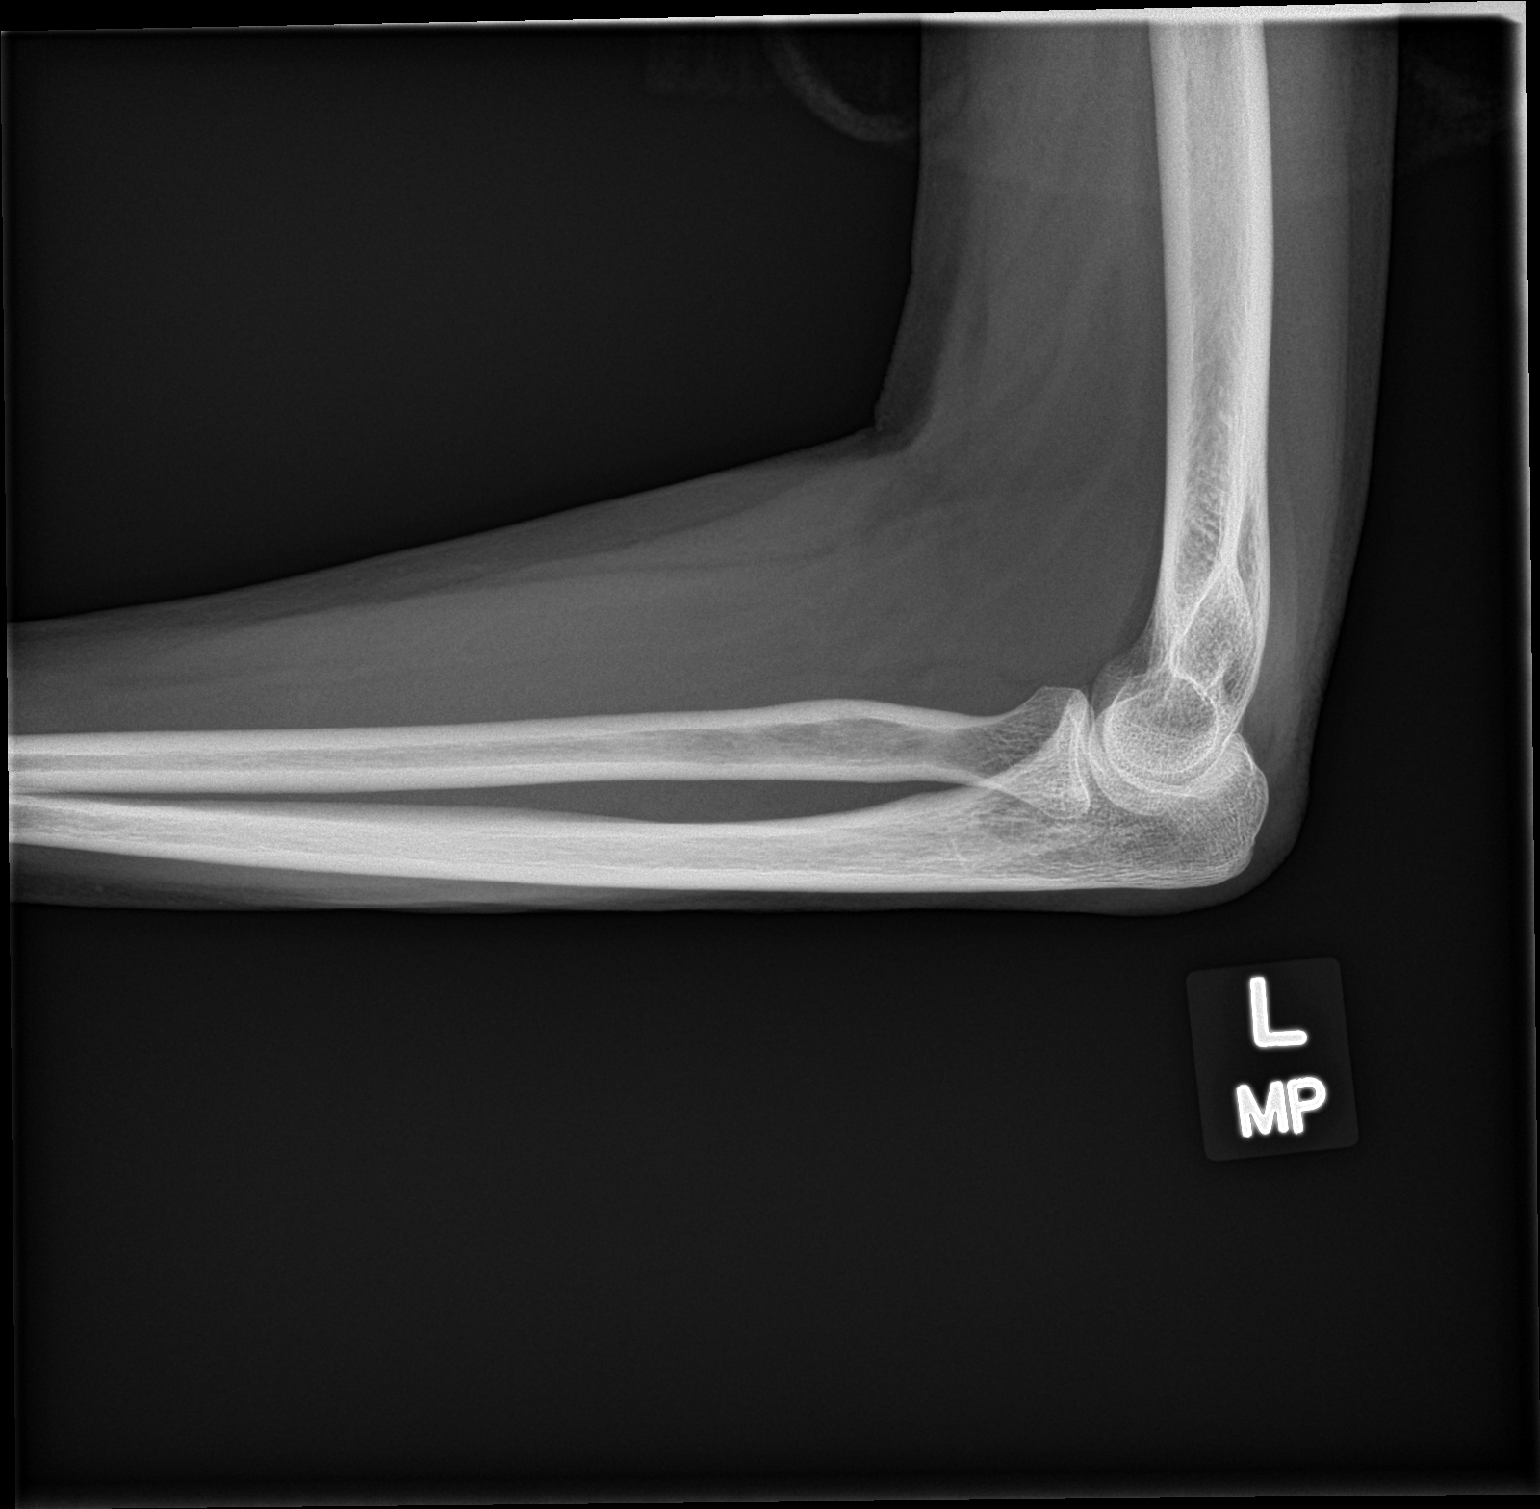

[2 of 2 positions shown; findings below may reference images not displayed]

FINDINGS: There is no acute fracture or dislocation. There is arthritic
changes of the base of the thumb. No joint effusion. The soft
tissues are unremarkable
IMPRESSION: No acute fracture or dislocation.

## 2020-12-22 IMAGING — DX DG WRIST COMPLETE 3+V*L*
3 series · 3 of 3 positions shown · non-contrast
Comparison: None.

CLINICAL DATA: Fall and trauma to the left upper extremity.

EXAM:
LEFT WRIST - COMPLETE 3+ VIEW; LEFT ELBOW - 2 VIEW

[wrist ap]
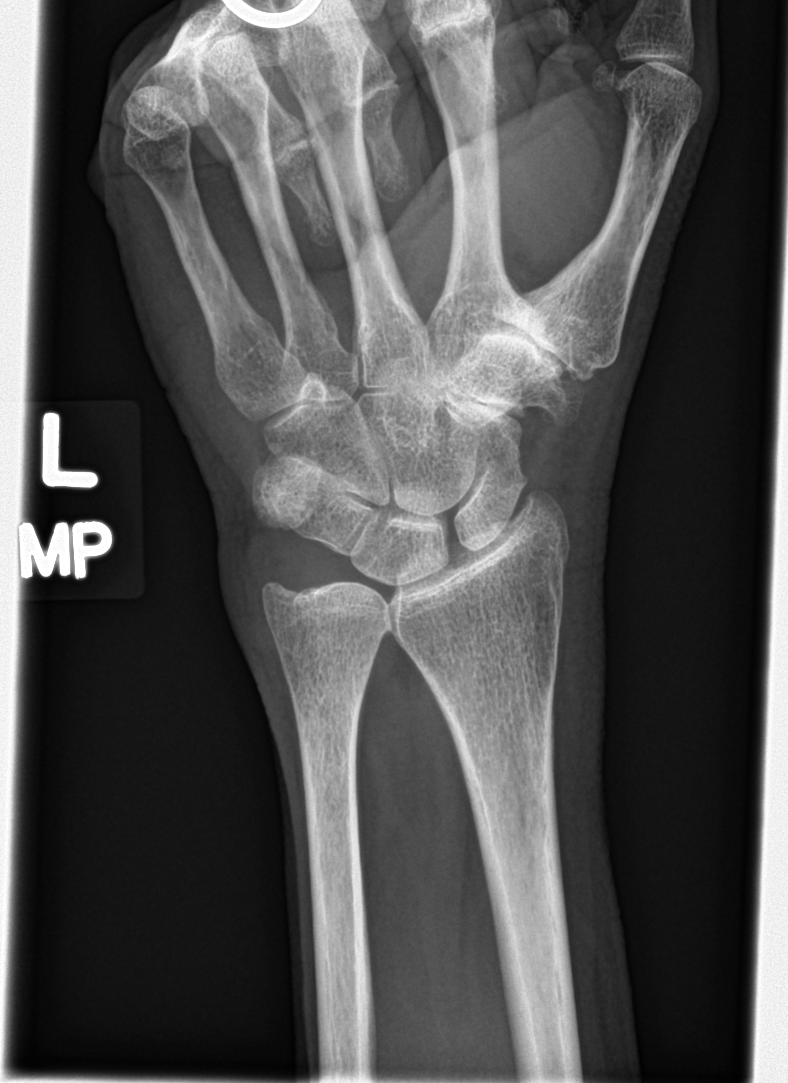

[wrist lat]
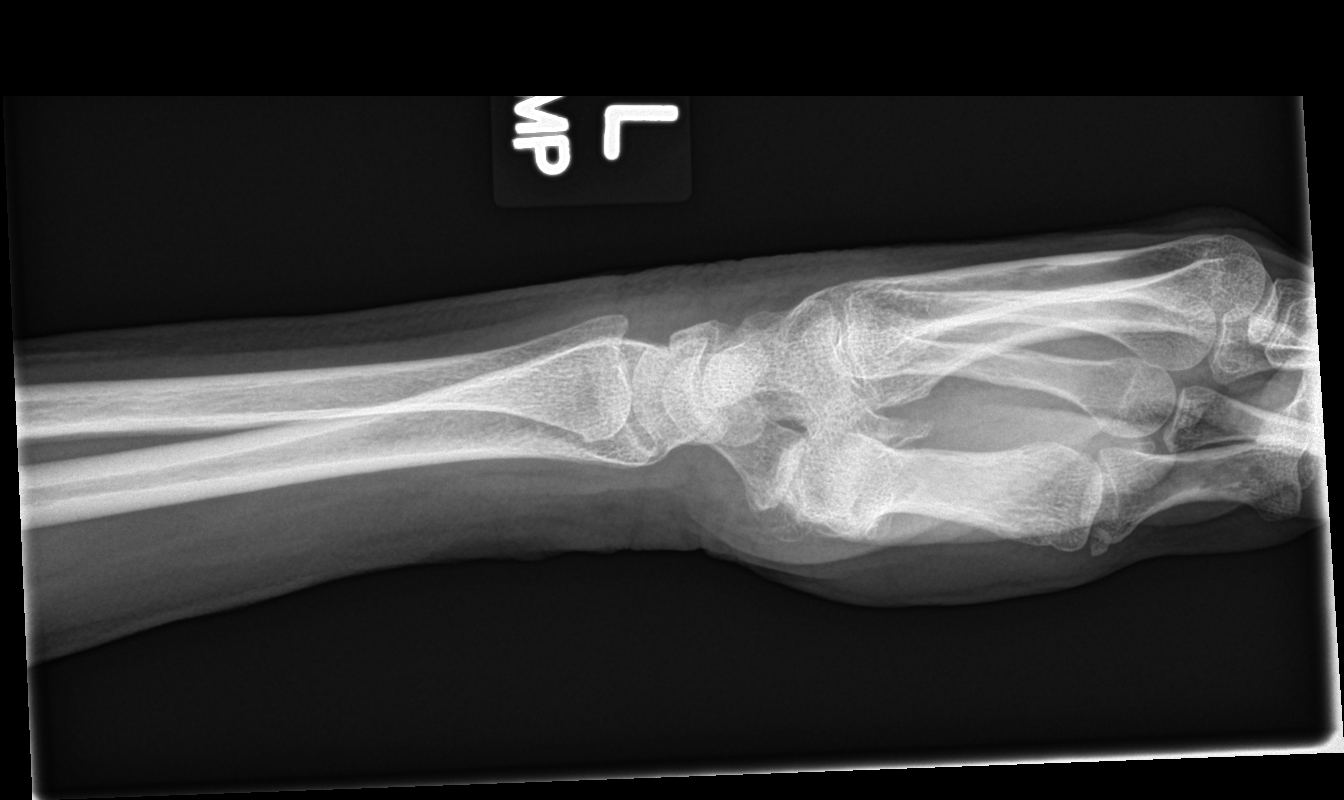

[wrist obl]
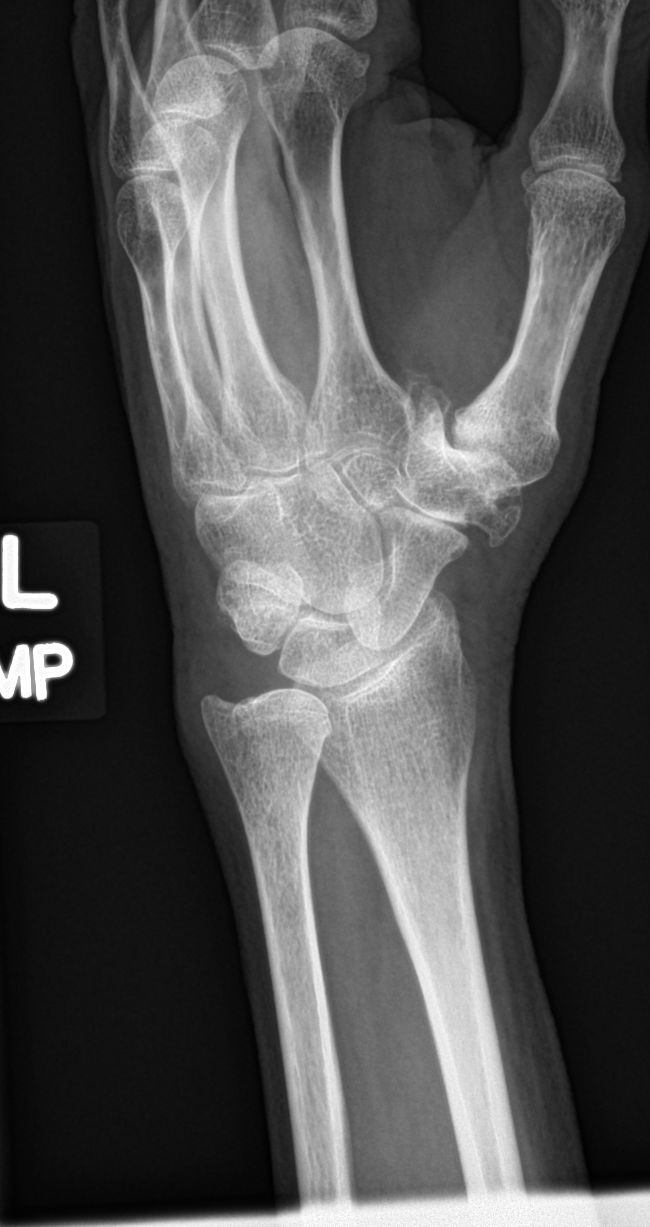

[3 of 3 positions shown; findings below may reference images not displayed]

FINDINGS: There is no acute fracture or dislocation. There is arthritic
changes of the base of the thumb. No joint effusion. The soft
tissues are unremarkable
IMPRESSION: No acute fracture or dislocation.

## 2020-12-22 NOTE — Patient Instructions (Signed)
Wrist Sprain, Adult A wrist sprain is a stretch or tear in the strong tissues that connect the wrist bones to each other. These strong tissues are called ligaments. There are three types of wrist sprains: Grade 1. The ligament is stretched more than normal. There may be a minor amount of wrist pain. Grade 2. The ligament is partially torn. You may be able to move your wrist, but not very much. There may be a moderate amount of wrist pain. Grade 3. The ligament or ligaments are completely torn. You may find it difficult to move your wrist even a little. There may be a significant amount of wrist pain. What are the causes? This condition may be caused by using the wrist too much during sports,exercise, or work. It can also happen due to a fall or during an accident. What increases the risk? You are more likely to develop this condition if: You had a previous wrist or arm injury. You have poor wrist strength and flexibility. You play contact sports, such as football or soccer. You participate in sports that may result in a fall, such as skateboarding, biking, skiing, or snowboarding. You do not exercise regularly. You use exercise equipment that does not fit well. What are the signs or symptoms? Symptoms of this condition include: Pain in the wrist, arm, or hand. Swelling or bruised skin near the wrist, hand, or arm. The skin may look yellow or blue. Stiffness or trouble moving the hand. Hearing a noise, like a pop or a snap, at the time of injury, or feeling a tear at the time of the injury. A warm feeling in the skin around the wrist. How is this diagnosed? This condition is diagnosed with a physical exam. Sometimes an X-ray is taken to make sure a bone did not break. You may also have an MRI of your wrist tocheck for torn ligaments. How is this treated? This condition is treated by resting and applying ice to your wrist. Additional treatment may include: Taking medicine for pain and  inflammation. Wearing a splint, brace, or cast for a short period of time to keep your wrist from moving (immobilized). Doing exercises to strengthen and stretch your wrist. Having surgery. This may be done if the ligament is completely torn. Follow these instructions at home: If you have a splint or brace: Wear the splint or brace as told by your health care provider. Remove it only as told by your health care provider. Loosen it if your fingers tingle, become numb, or turn cold and blue. Keep it clean. If the splint or brace is not waterproof: Do not let it get wet. Cover it with a watertight covering when you take a bath or a shower. If you have a cast: Do not put pressure on any part of the cast until it is fully hardened. This may take several hours. Do not stick anything inside the cast to scratch your skin. Doing that increases your risk of infection. Check the skin around the cast every day. Tell your health care provider about any concerns. You may put lotion on dry skin around the edges of the cast. Do not put lotion on the skin underneath the cast. Keep it clean. If the cast is not waterproof: Do not let it get wet. Cover it with a watertight covering when you take a bath or shower. Managing pain, stiffness, and swelling  If directed, put ice on the injured area. To do this: If you have a removable splint   or brace, remove it as told by your health care provider. Put ice in a plastic bag. Place a towel between your skin and the bag or between the splint or cast and the bag. Leave the ice on for 20 minutes, 2-3 times a day. Remove the ice if your skin turns bright red. This is very important. If you cannot feel pain, heat, or cold, you have a greater risk of damage to the area. Move your fingers often to reduce stiffness and swelling. Raise (elevate) the injured area above the level of your heart while you are sitting or lying down.  Activity Rest your wrist as told by your  health care provider. Do not do things that cause pain. Ask your health care provider when it is safe to drive if you have a splint, brace, or cast on your wrist. Do exercises as told by your health care provider. Return to your normal activities as told by your health care provider. Ask your health care provider what activities are safe for you. General instructions Take over-the-counter and prescription medicines only as told by your health care provider. Do not use any products that contain nicotine or tobacco, such as cigarettes, e-cigarettes, and chewing tobacco. These can delay healing. If you need help quitting, ask your health care provider. Keep all follow-up visits. This is important. Contact a health care provider if: Your pain, bruising, or swelling gets worse. Your skin becomes red, gets a rash, or has open sores. Your pain does not get better or it gets worse. Get help right away if: You have a new or sudden sharp pain in the hand, arm, or wrist. You have tingling or numbness in your hand. Your fingers turn white, very red, or cold and blue. You cannot move your fingers. Summary A wrist sprain is damage to ligaments in your wrist. Wrist sprains can range from mild to severe. Return to your normal activities as told by your health care provider. Ask your health care provider what activities are safe for you. You may need to wear a splint, brace, or cast for a short period of time. This information is not intended to replace advice given to you by your health care provider. Make sure you discuss any questions you have with your healthcare provider. Document Revised: 04/30/2019 Document Reviewed: 04/30/2019 Elsevier Patient Education  2022 [employer] Inc.  

## 2020-12-22 NOTE — Progress Notes (Signed)
Subjective:    Patient ID: Ruth Terry, female    DOB: 02-27-1955, 65 y.o.   MRN: [id number]   Chief Complaint: Left hand pain and Shoulder Pain (Left shoulder. Fell on Friday)   HPI Patient comes in c/o left arm  injury. She [place] [address] [place]. She fell on her left arm.  Her whole entire left arm is hurting. Denies any hand numbness     Review of Systems  Musculoskeletal:  Positive for arthralgias (entire let arm).      Objective:   Physical Exam Musculoskeletal:     Comments: FROM of left shoulder with no pain FROM of left elbow with pain on palpation laterally Pain left wrist in flexion and extension. Unable to fully pronate Pain with making a fist    Left wrist xray- waiting on radiology report-Preliminary reading by Paulene Floor, FNP  [facility] Left elbow- no fracture  or dislocation.      Assessment & Plan:   Ruth Terry in today with chief complaint of Left hand pain and Shoulder Pain (Left shoulder. Fell on Friday)   1. Arm injury, left, initial encounter  - DG Wrist Complete Left - DG Elbow 2 Views Left  2. Sprain of left wrist, initial encounter Wrist brace Will call with radiology report     The above assessment and management plan was discussed with the patient. The patient verbalized understanding of and has agreed to the management plan. Patient is aware to call the clinic if symptoms persist or worsen. Patient is aware when to return to the clinic for a follow-up visit. Patient educated on when it is appropriate to go to the emergency department.   Mary-Margaret Daphine Deutscher, FNP

## 2020-12-23 ENCOUNTER — Telehealth: Payer: Self-pay | Admitting: Family Medicine

## 2020-12-23 NOTE — Telephone Encounter (Signed)
Patient notified and verbalized understanding. Will call us back after the holidays if not any better

## 2020-12-23 NOTE — Telephone Encounter (Signed)
No fracture- can use either heat or ice which make sher feel better. Continue to wear wrist brace.

## 2020-12-25 NOTE — Telephone Encounter (Signed)
Key: [id number] - PA Case ID: [id number] - Rx #: [id number]  PA in Process for prolia

## 2020-12-30 ENCOUNTER — Telehealth: Payer: Self-pay | Admitting: Internal Medicine

## 2020-12-30 NOTE — Telephone Encounter (Signed)
Yes, can restart Zenpep and Levsin as previously prescribed.  Levsin should help with the spasm.  Continue Linzess, Metamucil for constipation.  Ensure drinking plenty of fluids.  Can also add MiraLAX 1 cap/day and titrate to effect.

## 2020-12-30 NOTE — Telephone Encounter (Signed)
Patient called states she had a colonoscopy but she is still not feeling well and is wanting to speak with a nurse regarding the next plan of action for her.

## 2020-12-30 NOTE — Telephone Encounter (Signed)
Returned patient's call. Pt states that she is still having stomach cramps, spasms, and constipation. Patient had a small bm last night but feels like she's not emptying. She states that sometimes the cramping gets worse after she eats but sometimes it's throughout the day. Patient has tried miralax all week and stated it doesn't do anything for her. Patient is also taking simethicone, linzess, metamucil, and a probiotic. Pt has stopped the reglan because she said Dr. Henrene Pastor wanted her to stop taking it because it could cause a heart attack. She wanted to know if she should restart the zenpep and Levsin. Pt also wanted to know what else could be done for her spasms and constipation. Pt saw  [name] Esterwood in the office on 10/22/20 and had an EGD and colonoscopy by Dr. Henrene Pastor on 12/6. Please advise.

## 2020-12-31 ENCOUNTER — Telehealth: Payer: Self-pay | Admitting: Physician Assistant

## 2020-12-31 NOTE — Telephone Encounter (Signed)
Spoke with pt and gave her recommendations. Pt verbalized understanding and had no other concerns at end of call.  

## 2020-12-31 NOTE — Telephone Encounter (Signed)
Patient reports no improvement. She was not able to go to work today due to her lower abdominal pain. She explains that it makes her "legs throb" with pain. She does not feel she is digesting her food. "I have only 49% of my digestion " based on a test done by Dr Sharlett Iles. Feels strongly that this is still an issue. She is taking her Miralax, fiber and Linzess and still does not feel she is having adequate bowel movements. She wants to come see you. She is scheduled 01/26/21. Asks what do you suggest in the meanwhile.

## 2020-12-31 NOTE — Telephone Encounter (Signed)
Patient called asking you to return her call.  She spoke to someone yesterday, but she wanted to speak to you as she and [name] had been discussing some alternatives for her to try.  She is in a meeting and won't be available to take a call until after 3 this afternoon.  Please call.  Thank you.

## 2021-01-02 ENCOUNTER — Ambulatory Visit (INDEPENDENT_AMBULATORY_CARE_PROVIDER_SITE_OTHER): Payer: 59

## 2021-01-02 ENCOUNTER — Ambulatory Visit (INDEPENDENT_AMBULATORY_CARE_PROVIDER_SITE_OTHER): Payer: 59 | Admitting: Nurse Practitioner

## 2021-01-02 ENCOUNTER — Encounter: Payer: Self-pay | Admitting: Nurse Practitioner

## 2021-01-02 VITALS — BP 135/88 | HR 104 | Temp 97.6°F | Ht 60.0 in | Wt 99.2 lb

## 2021-01-02 DIAGNOSIS — R1084 Generalized abdominal pain: Secondary | ICD-10-CM | POA: Diagnosis not present

## 2021-01-02 IMAGING — DX DG ABDOMEN 2V
3 series · 3 of 3 positions shown · non-contrast
Comparison: Abdominal x-ray [DATE]. CT abdomen and pelvis
[DATE].

CLINICAL DATA: Abdominal pain.

EXAM:
ABDOMEN - 2 VIEW

[abdomen erect]
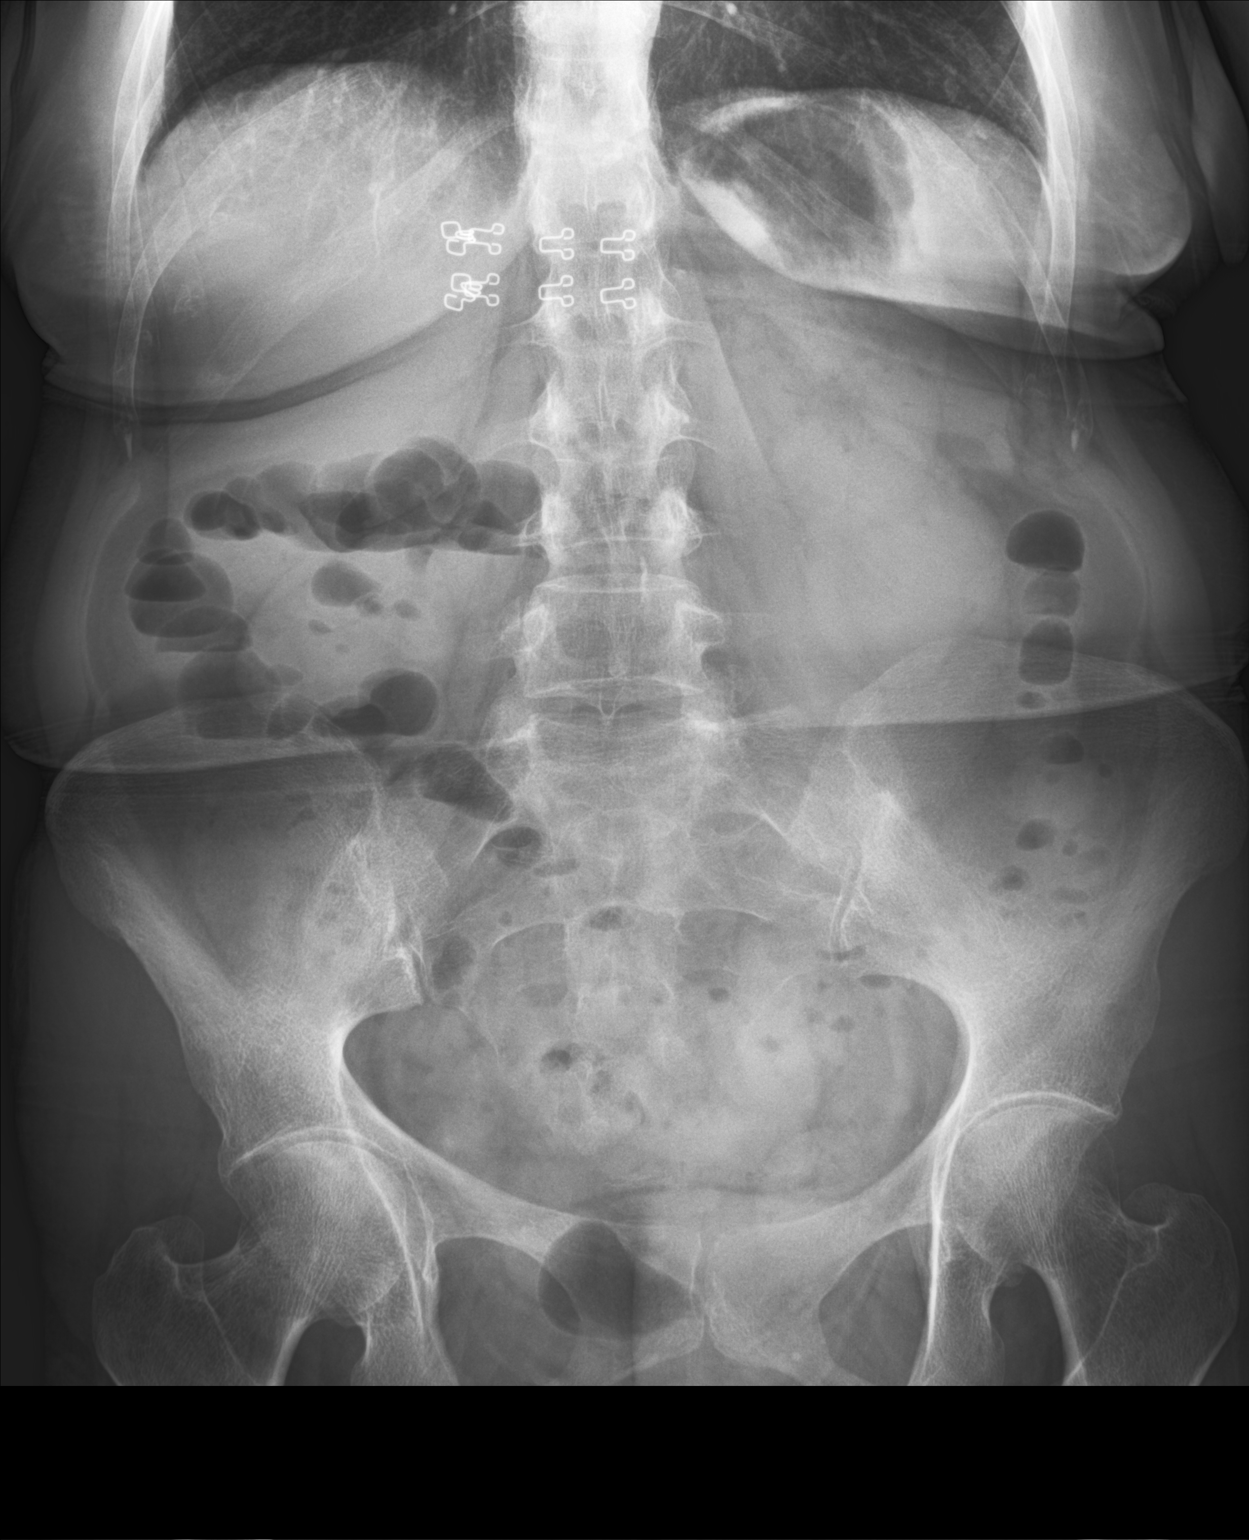

[abdomen supine (1 of 2)]
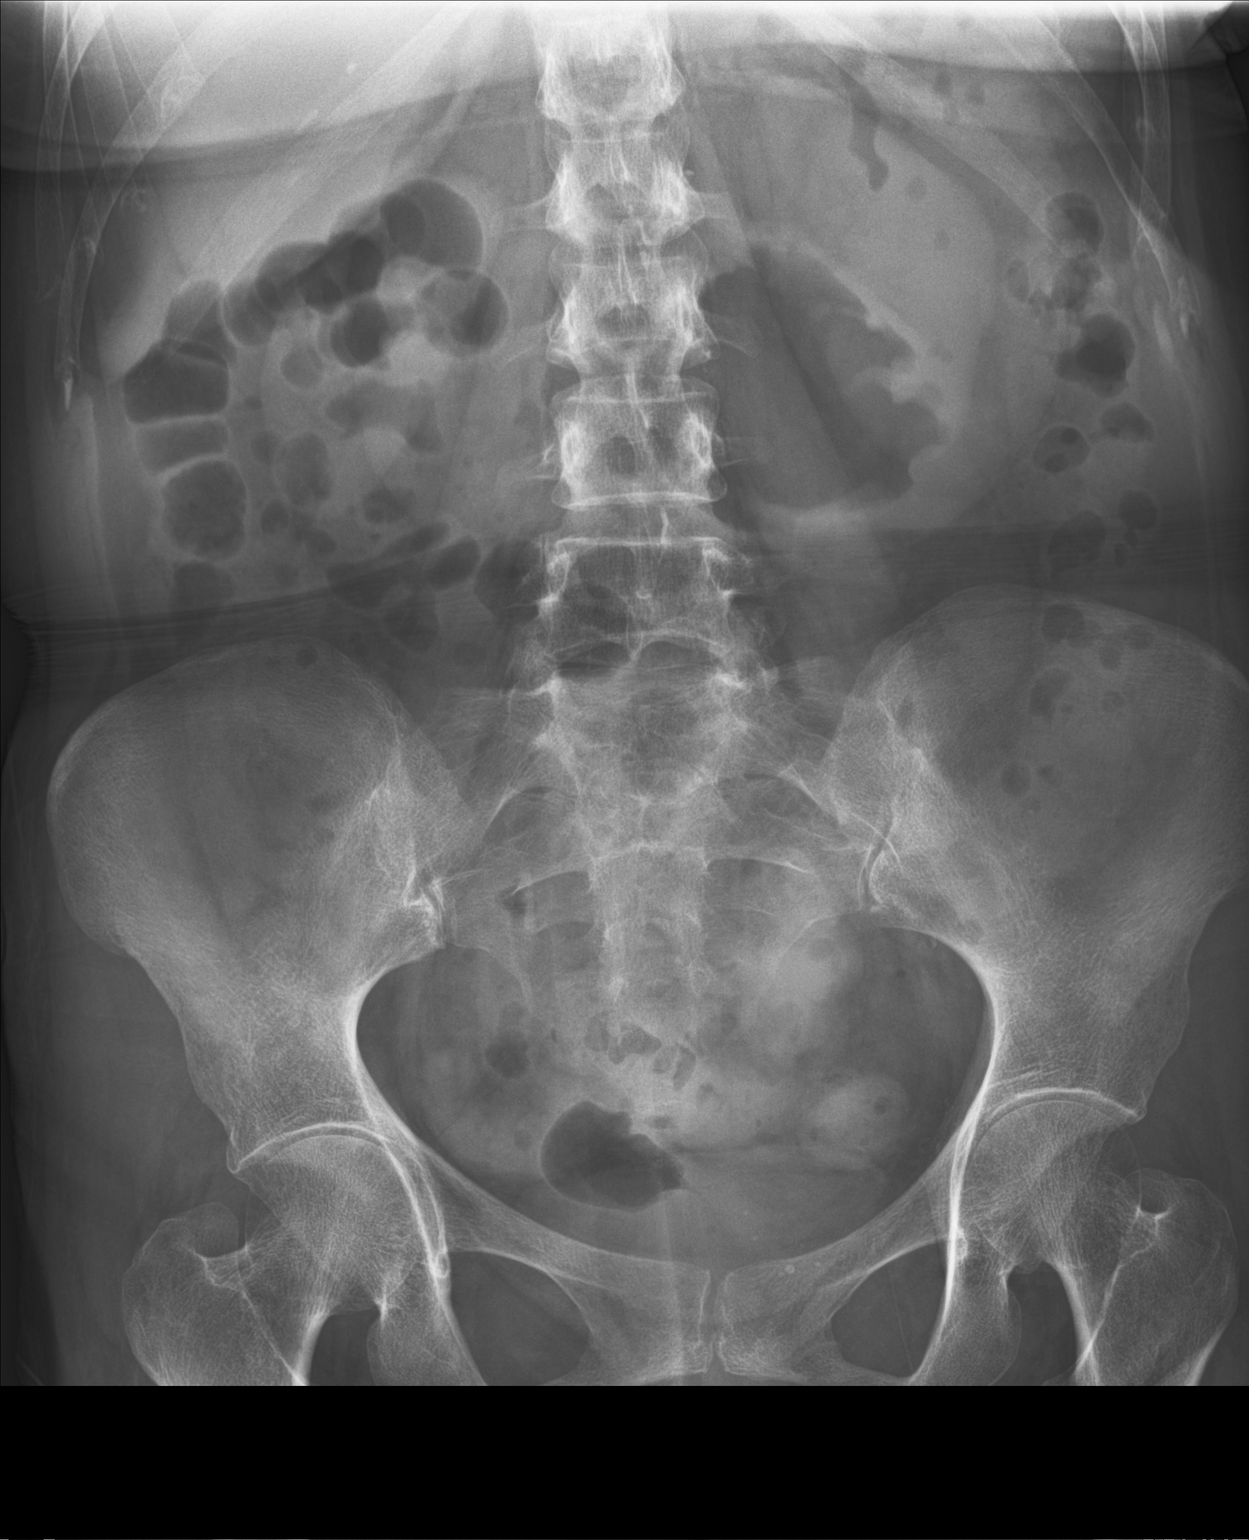

[abdomen supine (2 of 2)]
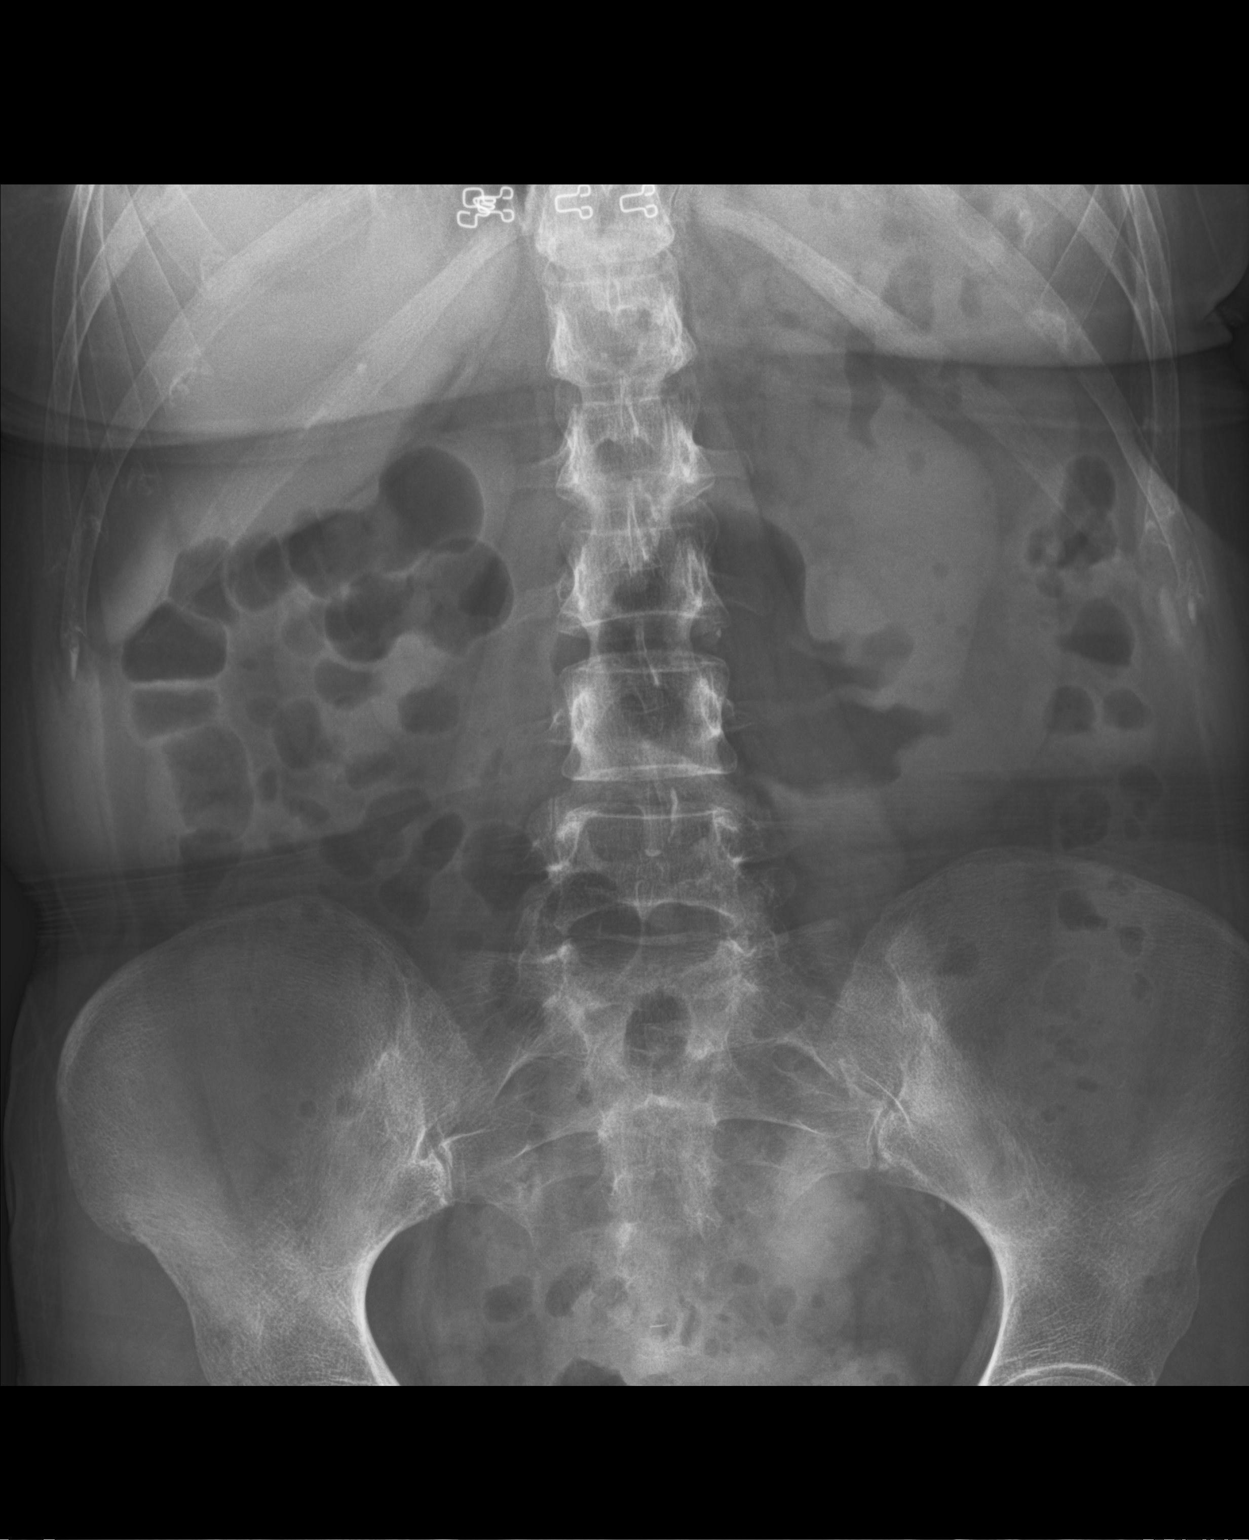

[3 of 3 positions shown; findings below may reference images not displayed]

FINDINGS: The bowel gas pattern is normal. There is some nonspecific air-fluid
levels in the right colon. There is no evidence of free air. No
radio-opaque calculi or other significant radiographic abnormality
is seen.
IMPRESSION: Negative.

## 2021-01-02 NOTE — Assessment & Plan Note (Signed)
Patient is reporting unresolved generalized abdominal pain in the past few days and believes that she is constipated because she has not a bowel movement in 8 days.  X-ray completed results pending.  Patient does report drinking enough fluid, I encourage patient to continue symptom management as recommended by Dr. Henrene Pastor.  To restart zenpep and Levsin for spasm, continue Linzess, Metamucil for constipation, increasing hydration and MiraLAX 1 cap per day and titrate.  Patient verbalized understanding.  Patient reports she is already doing all of the above.   Patient may need to return to GI for  consultation and reassessment.

## 2021-01-02 NOTE — Patient Instructions (Signed)

## 2021-01-02 NOTE — Progress Notes (Signed)
Acute Office Visit  Subjective:    Patient ID: Ruth Terry, female    DOB: 08/24/1955, 65 y.o.   MRN: [id number]  Chief Complaint  Patient presents with   Abdominal Pain    HPI Patient is in today for Abdominal Pain: Patient complains of abdominal pain. The pain is described as aching, and is 6/10 in intensity. Pain is located in the generalized abdominal pain without radiation. Onset was a few days ago. Symptoms have been unchanged since. Aggravating factors: none.  Alleviating factors: none. Associated symptoms: none. The patient denies constipation.    Past Medical History:  Diagnosis Date   Allergy    Anal fissure    Cataract    Chronic constipation    Diverticulosis 04/20/2010   colonoscopy   Gastroparesis    GERD (gastroesophageal reflux disease)    Hemorrhoids    Hyperlipidemia    IBS (irritable bowel syndrome)    MVP (mitral valve prolapse)    Osteoporosis    Polyp, stomach 04/20/2010   egd   Shingles    UTI (lower urinary tract infection)     Past Surgical History:  Procedure Laterality Date   COLONOSCOPY     ESOPHAGOGASTRODUODENOSCOPY      Family History  Problem Relation Age of Onset   Hypertension Mother    Diabetes Father    Heart attack Father        Age 14   Colon cancer Neg Hx    Esophageal cancer Neg Hx    Rectal cancer Neg Hx     Social History   Socioeconomic History   Marital status: Married    Spouse name: Not on file   Number of children: 0   Years of education: Not on file   Highest education level: Not on file  Occupational History   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    Employer: [employer]  Tobacco Use   Smoking status: Former    Packs/day: 0.50    Years: 6.00    Pack years: 3.00    Types: Cigarettes   Smokeless tobacco: Never   Tobacco comments:    USE SMOKELESS CIGARETTES  Vaping Use   Vaping Use: Every day   Substances: Nicotine  Substance and Sexual Activity    Alcohol use: Yes    Alcohol/week: 0.0 standard drinks    Comment: 1 a week, wine   Drug use: No   Sexual activity: Not on file  Other Topics Concern   Not on file  Social History Narrative   Not on file   Social Determinants of Health   Financial Resource Strain: Not on file  Food Insecurity: Not on file  Transportation Needs: Not on file  Physical Activity: Not on file  Stress: Not on file  Social Connections: Not on file  Intimate Partner Violence: Not on file    Outpatient Medications Prior to Visit  Medication Sig Dispense Refill   Cholecalciferol (VITAMIN D-3) 25 MCG (1000 UT) CAPS Take 1 capsule by mouth daily.     DEXILANT 60 MG capsule Take 1 capsule (60 mg total) by mouth daily. Symptoms refractory to Prevacid and Omeprazole.  New PA needed for dexilant. May need to submit as brand 90 capsule 3   DULoxetine (CYMBALTA) 60 MG capsule Take 1 capsule (60 mg total) by mouth daily. 90 capsule 3   fluticasone (FLONASE) 50 MCG/ACT nasal spray Place 2 sprays into both nostrils daily. 18.2 mL 2  hydrocortisone cream 1 % Apply 1 application topically 2 (two) times daily. 30 g 0   hyoscyamine (LEVSIN SL) 0.125 MG SL tablet Place 1 tablet (0.125 mg total) under the tongue every 6 (six) hours as needed. 40 tablet 0   ibandronate (BONIVA) 150 MG tablet Take 1 tablet (150 mg total) by mouth every 30 (thirty) days. Take in the morning with a full glass of water, on an empty stomach, and do not take anything else by mouth or lie down for the next 30 min. 3 tablet 0   levocetirizine (XYZAL) 5 MG tablet Take 1 tablet (5 mg total) by mouth every evening. For allergies 90 tablet 3   linaclotide (LINZESS) 145 MCG CAPS capsule Take 1 capsule (145 mcg total) by mouth daily before breakfast. 90 capsule 3   meloxicam (MOBIC) 15 MG tablet Take 1 tablet (15 mg total) by mouth daily as needed for pain. 30 tablet 0   metoCLOPramide (REGLAN) 5 MG tablet Take 1 tablet (5 mg total) by mouth 3 (three) times  daily before meals. 90 tablet 6   Probiotic Product (ALIGN) 4 MG CAPS Take 1 capsule by mouth daily.     psyllium (METAMUCIL) 58.6 % packet Take 1 packet by mouth daily.     Rectal Protectant-Emollient (CALMOL-4) 76-10 % SUPP Place 1 suppository rectally as needed (for hemmorrhoids). 30 suppository 1   Simethicone (PHAZYME MAXIMUM STRENGTH) 250 MG CAPS Take 250 mg by mouth 3 (three) times daily. (Patient taking differently: Take 250 mg by mouth 3 (three) times daily as needed (gas relief).) 14 capsule    triamcinolone (NASACORT) 55 MCG/ACT AERO nasal inhaler Place 2 sprays into the nose daily. 3 each 3   valACYclovir (VALTREX) 1000 MG tablet TAKE 1 TABLET (1,000 MG TOTAL) BY MOUTH AT BEDTIME. 90 tablet 1   Pancrelipase, Lip-Prot-Amyl, (ZENPEP) 25000-79000 units CPEP Take 2 capsules by mouth 3 (three) times daily with meals. (Patient taking differently: Take 1 capsule by mouth 2 (two) times daily with a meal.) 180 capsule 3   Facility-Administered Medications Prior to Visit  Medication Dose Route Frequency Provider Last Rate Last Admin   methylPREDNISolone acetate (DEPO-MEDROL) injection 40 mg  40 mg Intramuscular Once Gottschalk, Ashly M, DO        Allergies  Allergen Reactions   Chlordiazepoxide-Clidinium Itching   Ciprofloxacin Itching    Irritates stomach   Flagyl [Metronidazole]     Severe yeast infection   Sulfa Drugs Cross Reactors Nausea Only   Cefdinir Nausea And Vomiting   Nitrofurantoin Monohyd Macro Rash    Review of Systems  Constitutional: Negative.   HENT: Negative.    Eyes: Negative.   Respiratory: Negative.    Cardiovascular: Negative.   Gastrointestinal:  Positive for abdominal pain. Negative for constipation.  Genitourinary: Negative.   Skin:  Negative for rash.  All other systems reviewed and are negative.     Objective:    Physical Exam Vitals and nursing note reviewed.  Constitutional:      Appearance: She is well-developed. She is obese.  HENT:      Head: Normocephalic.     Right Ear: External ear normal.     Left Ear: External ear normal.     Nose: Nose normal.  Cardiovascular:     Rate and Rhythm: Normal rate and regular rhythm.  Pulmonary:     Effort: Pulmonary effort is normal.     Breath sounds: Normal breath sounds.  Abdominal:     Palpations: Abdomen is  soft.     Tenderness: There is abdominal tenderness.  Skin:    Findings: No rash.  Neurological:     Mental Status: She is alert and oriented to person, place, and time.  Psychiatric:        Behavior: Behavior normal.    BP 135/88    Pulse (!) 104    Temp 97.6 F (36.4 C)    Ht 5' (1.524 m)    Wt 99 lb 3.2 oz (45 kg)    SpO2 100%    BMI 19.37 kg/m  Wt Readings from Last 3 Encounters:  01/02/21 99 lb 3.2 oz (45 kg)  12/22/20 102 lb (46.3 kg)  12/09/20 100 lb (45.4 kg)    Health Maintenance Due  Topic Date Due   Pneumococcal Vaccine 92-37 Years old (1 - PCV) Never done   TETANUS/TDAP  Never done   Zoster Vaccines- Shingrix (1 of 2) Never done   PAP SMEAR-Modifier  04/30/2018   URINE MICROALBUMIN  05/08/2019   INFLUENZA VACCINE  Never done    There are no preventive care reminders to display for this patient.   Lab Results  Component Value Date   TSH 1.170 11/13/2020   Lab Results  Component Value Date   WBC 5.5 11/08/2020   HGB 14.8 11/08/2020   HCT 44.4 11/08/2020   MCV 92.1 11/08/2020   PLT 243 11/08/2020   Lab Results  Component Value Date   NA 141 11/08/2020   K 3.5 11/08/2020   CO2 29 11/08/2020   GLUCOSE 87 11/08/2020   BUN 6 (L) 11/08/2020   CREATININE 0.64 11/08/2020   BILITOT 0.4 09/05/2020   ALKPHOS 68 09/05/2020   AST 17 09/05/2020   ALT 14 09/05/2020   PROT 6.8 09/05/2020   ALBUMIN 4.1 09/05/2020   CALCIUM 9.8 11/08/2020   ANIONGAP 7 11/08/2020   EGFR 100 09/17/2020   GFR 74.22 09/12/2014   Lab Results  Component Value Date   CHOL 235 (H) 08/06/2019   Lab Results  Component Value Date   HDL 72 08/06/2019   Lab  Results  Component Value Date   LDLCALC 140 (H) 08/06/2019   Lab Results  Component Value Date   TRIG 133 08/06/2019   Lab Results  Component Value Date   CHOLHDL 3.3 08/06/2019   Lab Results  Component Value Date   HGBA1C 5.5 05/08/2018       Assessment & Plan:   Problem List Items Addressed This Visit       Other   Generalized abdominal pain - Primary    Patient is reporting unresolved generalized abdominal pain in the past few days and believes that she is constipated because she has not a bowel movement in 8 days.  X-ray completed results pending.  Patient does report drinking enough fluid, I encourage patient to continue symptom management as recommended by Dr. Marina Goodell.  To restart zenpep and Levsin for spasm, continue Linzess, Metamucil for constipation, increasing hydration and MiraLAX 1 cap per day and titrate.  Patient verbalized understanding.  Patient reports she is already doing all of the above.   Patient may need to return to GI for  consultation and reassessment.      Relevant Orders   DG Abd 2 Views     No orders of the defined types were placed in this encounter.    Daryll Drown, NP

## 2021-01-06 ENCOUNTER — Encounter: Payer: Self-pay | Admitting: Family Medicine

## 2021-01-06 ENCOUNTER — Ambulatory Visit (INDEPENDENT_AMBULATORY_CARE_PROVIDER_SITE_OTHER): Payer: Self-pay | Admitting: Family Medicine

## 2021-01-06 DIAGNOSIS — R300 Dysuria: Secondary | ICD-10-CM

## 2021-01-06 LAB — URINALYSIS, ROUTINE W REFLEX MICROSCOPIC
Bilirubin, UA: NEGATIVE
Glucose, UA: NEGATIVE
Ketones, UA: NEGATIVE
Leukocytes,UA: NEGATIVE
Nitrite, UA: NEGATIVE
Protein,UA: NEGATIVE
RBC, UA: NEGATIVE
Specific Gravity, UA: <1.005 — ABNORMAL LOW (ref 1.005–1.030)
Urobilinogen, Ur: 0.2 mg/dL (ref 0.2–1.0)
pH, UA: 6 (ref 5.0–7.5)

## 2021-01-06 NOTE — Telephone Encounter (Signed)
Inbound call from patient stating she went to see her PCP on 01/02/21 where they did abdominal x-ray.  They recommended for patient to follow-up with her GI provider.  Please advise.

## 2021-01-06 NOTE — Telephone Encounter (Signed)
Inbound call from patient states she have gotten worse and need to speak with Beth to update [name]

## 2021-01-06 NOTE — Progress Notes (Signed)
Virtual Visit via Telephone Note  I connected with Ruth Terry on 01/06/21 at 12:55 PM by telephone and verified that I am speaking with the correct person using two identifiers. Ruth Terry is currently located at home and nobody is currently with her during this visit. The provider, Loman Brooklyn, FNP is located in their office at time of visit.  I discussed the limitations, risks, security and privacy concerns of performing an evaluation and management service by telephone and the availability of in person appointments. I also discussed with the patient that there may be a patient responsible charge related to this service. The patient expressed understanding and agreed to proceed.  Subjective: PCP: [name], DO  Chief Complaint  Patient presents with   Urinary Tract Infection   Patient complains of dysuria, frequency, and suprapubic pressure. She has had symptoms for 4 days. Patient also complains of  chills . Patient denies back pain and fever. Patient does have a history of recurrent UTI.  Patient does not have a history of pyelonephritis. She has taken AZO which has not been helpful.   ROS: Per HPI  Current Outpatient Medications:    Cholecalciferol (VITAMIN D-3) 25 MCG (1000 UT) CAPS, Take 1 capsule by mouth daily., Disp: , Rfl:    DEXILANT 60 MG capsule, Take 1 capsule (60 mg total) by mouth daily. Symptoms refractory to Prevacid and Omeprazole.  New PA needed for dexilant. May need to submit as brand, Disp: 90 capsule, Rfl: 3   DULoxetine (CYMBALTA) 60 MG capsule, Take 1 capsule (60 mg total) by mouth daily., Disp: 90 capsule, Rfl: 3   fluticasone (FLONASE) 50 MCG/ACT nasal spray, Place 2 sprays into both nostrils daily., Disp: 18.2 mL, Rfl: 2   hydrocortisone cream 1 %, Apply 1 application topically 2 (two) times daily., Disp: 30 g, Rfl: 0   hyoscyamine (LEVSIN SL) 0.125 MG SL tablet, Place 1 tablet (0.125 mg total) under the tongue every 6 (six) hours as  needed., Disp: 40 tablet, Rfl: 0   ibandronate (BONIVA) 150 MG tablet, Take 1 tablet (150 mg total) by mouth every 30 (thirty) days. Take in the morning with a full glass of water, on an empty stomach, and do not take anything else by mouth or lie down for the next 30 min., Disp: 3 tablet, Rfl: 0   levocetirizine (XYZAL) 5 MG tablet, Take 1 tablet (5 mg total) by mouth every evening. For allergies, Disp: 90 tablet, Rfl: 3   linaclotide (LINZESS) 145 MCG CAPS capsule, Take 1 capsule (145 mcg total) by mouth daily before breakfast., Disp: 90 capsule, Rfl: 3   meloxicam (MOBIC) 15 MG tablet, Take 1 tablet (15 mg total) by mouth daily as needed for pain., Disp: 30 tablet, Rfl: 0   metoCLOPramide (REGLAN) 5 MG tablet, Take 1 tablet (5 mg total) by mouth 3 (three) times daily before meals., Disp: 90 tablet, Rfl: 6   Pancrelipase, Lip-Prot-Amyl, (ZENPEP) 25000-79000 units CPEP, Take 2 capsules by mouth 3 (three) times daily with meals. (Patient taking differently: Take 1 capsule by mouth 2 (two) times daily with a meal.), Disp: 180 capsule, Rfl: 3   Probiotic Product (ALIGN) 4 MG CAPS, Take 1 capsule by mouth daily., Disp: , Rfl:    psyllium (METAMUCIL) 58.6 % packet, Take 1 packet by mouth daily., Disp: , Rfl:    Rectal Protectant-Emollient (CALMOL-4) 76-10 % SUPP, Place 1 suppository rectally as needed (for hemmorrhoids)., Disp: 30 suppository, Rfl: 1  Simethicone (PHAZYME MAXIMUM STRENGTH) 250 MG CAPS, Take 250 mg by mouth 3 (three) times daily. (Patient taking differently: Take 250 mg by mouth 3 (three) times daily as needed (gas relief).), Disp: 14 capsule, Rfl:    triamcinolone (NASACORT) 55 MCG/ACT AERO nasal inhaler, Place 2 sprays into the nose daily., Disp: 3 each, Rfl: 3   valACYclovir (VALTREX) 1000 MG tablet, TAKE 1 TABLET (1,000 MG TOTAL) BY MOUTH AT BEDTIME., Disp: 90 tablet, Rfl: 1  Current Facility-Administered Medications:    methylPREDNISolone acetate (DEPO-MEDROL) injection 40 mg, 40 mg,  Intramuscular, Once, Gottschalk, Ashly M, DO  Allergies  Allergen Reactions   Chlordiazepoxide-Clidinium Itching   Ciprofloxacin Itching    Irritates stomach   Flagyl [Metronidazole]     Severe yeast infection   Sulfa Drugs Cross Reactors Nausea Only   Cefdinir Nausea And Vomiting   Nitrofurantoin Monohyd Macro Rash   Past Medical History:  Diagnosis Date   Allergy    Anal fissure    Cataract    Chronic constipation    Diverticulosis 04/20/2010   colonoscopy   Gastroparesis    GERD (gastroesophageal reflux disease)    Hemorrhoids    Hyperlipidemia    IBS (irritable bowel syndrome)    MVP (mitral valve prolapse)    Osteoporosis    Polyp, stomach 04/20/2010   egd   Shingles    UTI (lower urinary tract infection)     Observations/Objective: A&O  No respiratory distress or wheezing audible over the phone Mood, judgement, and thought processes all WNL  Assessment and Plan: 1. Dysuria UA negative. Patient is going to try to wait on urine culture to result without an antibiotic.  - Urinalysis, Routine w reflex microscopic; Future -Urine dipstick shows negative for all components.  Micro exam: negative for WBC's or RBC's. - Urine Culture; Future   Follow Up Instructions:  I discussed the assessment and treatment plan with the patient. The patient was provided an opportunity to ask questions and all were answered. The patient agreed with the plan and demonstrated an understanding of the instructions.   The patient was advised to call back or seek an in-person evaluation if the symptoms worsen or if the condition fails to improve as anticipated.  The above assessment and management plan was discussed with the patient. The patient verbalized understanding of and has agreed to the management plan. Patient is aware to call the clinic if symptoms persist or worsen. Patient is aware when to return to the clinic for a follow-up visit. Patient educated on when it is appropriate to  go to the emergency department.   Time call ended: 1:06 PM  I provided 11 minutes of non-face-to-face time during this encounter.  Hendricks Limes, MSN, APRN, FNP-C [facility] 01/06/21

## 2021-01-06 NOTE — Telephone Encounter (Signed)
Spoke with the patient. She is feeling increased discomfort. She states she cannot eat but a few bites and she feels full. Her reflux is back. She is not having daily bowel movements despite using daily fiber and "drinking a ton of water." She has concerns that the gastroparesis is causing some of this. Reports increased gas of her intestines. Overall feels terrible. She is missing work due to her symptoms. Wants to be seen sooner. No sooner appointments on Nicoletta Ba, PA schedule. Appointment for her found on her primary Gi doctor schedule. Patient wants this appointment.

## 2021-01-06 NOTE — Telephone Encounter (Signed)
Negative abdominal  xray. Should I look for an earlier appointment with her primary GI?

## 2021-01-08 LAB — URINE CULTURE: Organism ID, Bacteria: NO GROWTH

## 2021-01-09 NOTE — Telephone Encounter (Signed)
This insurance plan cut off all members as of 01/03/21. Will need to know NEW insurance plan for 2023 before coverage can be checked

## 2021-01-12 DIAGNOSIS — R5383 Other fatigue: Secondary | ICD-10-CM | POA: Diagnosis not present

## 2021-01-12 DIAGNOSIS — J3489 Other specified disorders of nose and nasal sinuses: Secondary | ICD-10-CM | POA: Diagnosis not present

## 2021-01-12 DIAGNOSIS — K219 Gastro-esophageal reflux disease without esophagitis: Secondary | ICD-10-CM | POA: Diagnosis not present

## 2021-01-12 DIAGNOSIS — K589 Irritable bowel syndrome without diarrhea: Secondary | ICD-10-CM | POA: Diagnosis not present

## 2021-01-12 NOTE — Telephone Encounter (Signed)
Patient states she is transferring to another practice and will work on getting this Prolia there.

## 2021-01-14 ENCOUNTER — Ambulatory Visit (INDEPENDENT_AMBULATORY_CARE_PROVIDER_SITE_OTHER): Payer: Self-pay | Admitting: Internal Medicine

## 2021-01-14 ENCOUNTER — Encounter: Payer: Self-pay | Admitting: Internal Medicine

## 2021-01-14 ENCOUNTER — Other Ambulatory Visit: Payer: Self-pay

## 2021-01-14 VITALS — BP 113/70 | HR 107 | Ht 60.0 in | Wt 98.4 lb

## 2021-01-14 DIAGNOSIS — K5909 Other constipation: Secondary | ICD-10-CM

## 2021-01-14 DIAGNOSIS — K219 Gastro-esophageal reflux disease without esophagitis: Secondary | ICD-10-CM

## 2021-01-14 DIAGNOSIS — K582 Mixed irritable bowel syndrome: Secondary | ICD-10-CM

## 2021-01-14 MED ORDER — PANTOPRAZOLE SODIUM 40 MG PO TBEC: 40.0000 mg | DELAYED_RELEASE_TABLET | Freq: Every day | ORAL | 3 refills | Status: DC

## 2021-01-14 MED ORDER — TRULANCE 3 MG PO TABS: 1.0000 | ORAL_TABLET | Freq: Every day | ORAL | 6 refills | Status: DC

## 2021-01-14 NOTE — Patient Instructions (Signed)
If you are age 64 or older, your body mass index should be between 23-30. Your Body mass index is 19.22 kg/m. If this is out of the aforementioned range listed, please consider follow up with your Primary Care Provider.  If you are age 69 or younger, your body mass index should be between 19-25. Your Body mass index is 19.22 kg/m. If this is out of the aformentioned range listed, please consider follow up with your Primary Care Provider.   ________________________________________________________  The [facility] providers would like to encourage you to use [facility] to communicate with providers for non-urgent requests or questions.  Due to long hold times on the telephone, sending your provider a message by [facility] may be a faster and more efficient way to get a response.  Please allow 48 business hours for a response.  Please remember that this is for non-urgent requests.  _______________________________________________________   We have sent the following medications to your pharmacy for you to pick up at your convenience:  Trulance  Please follow up as needed

## 2021-01-14 NOTE — Progress Notes (Signed)
HISTORY OF PRESENT ILLNESS:  Ruth Terry is a 66 y.o. female with longstanding chronic functional abdominal complaints who presents today regarding the same.  Recent evaluations have included blood work, advanced imaging in the form of CT scanning, and colonoscopy and upper endoscopy 1 month ago.  No significant abnormalities.  For GERD she takes Dexilant.  She is wondering about an alternative due to insurance related costs.  For her bowels she has been using Linzess 145 mcg daily she has a bowel movement every other day.  She feels like she needs to strain.  When she does have bowel movements, she describes it as loose.  She will also have some belching and mild nausea.  She states that she feels "yucky".  She has a number of questions prepared.  We went through each of these together.  She wonders about trying Trulance for her constipation  REVIEW OF SYSTEMS:  All non-GI ROS negative unless otherwise stated in the HPI.  Past Medical History:  Diagnosis Date   Allergy    Anal fissure    Cataract    Chronic constipation    Diverticulosis 04/20/2010   colonoscopy   Gastroparesis    GERD (gastroesophageal reflux disease)    Hemorrhoids    Hyperlipidemia    IBS (irritable bowel syndrome)    MVP (mitral valve prolapse)    Osteoporosis    Polyp, stomach 04/20/2010   egd   Shingles    UTI (lower urinary tract infection)     Past Surgical History:  Procedure Laterality Date   COLONOSCOPY     ESOPHAGOGASTRODUODENOSCOPY      Social History Ruth Terry  reports that she has quit smoking. Her smoking use included cigarettes. She has a 3.00 pack-year smoking history. She has never used smokeless tobacco. She reports current alcohol use. She reports that she does not use drugs.  family history includes Diabetes in her father; Heart attack in her father; Hypertension in her mother.  Allergies  Allergen Reactions   Chlordiazepoxide-Clidinium Itching   Ciprofloxacin Itching     Irritates stomach   Flagyl [Metronidazole]     Severe yeast infection   Sulfa Drugs Cross Reactors Nausea Only   Cefdinir Nausea And Vomiting   Nitrofurantoin Monohyd Macro Rash       PHYSICAL EXAMINATION: Vital signs: BP 113/70    Pulse (!) 107    Ht 5' (1.524 m)    Wt 98 lb 6.4 oz (44.6 kg)    SpO2 97%    BMI 19.22 kg/m   Constitutional: generally well-appearing, no acute distress Psychiatric: alert and oriented x3, cooperative Eyes: extraocular movements intact, anicteric, conjunctiva pink Mouth: oral pharynx moist, no lesions Neck: supple no lymphadenopathy Cardiovascular: heart regular rate and rhythm, no murmur Lungs: clear to auscultation bilaterally Abdomen: soft, nontender, nondistended, no obvious ascites, no peritoneal signs, normal bowel sounds, no organomegaly Rectal: Omitted.  No tailbone cyst Extremities: no clubbing, cyanosis, or lower extremity edema bilaterally Skin: no lesions on visible extremities Neuro: No focal deficits.  Cranial nerves intact  ASSESSMENT:  1.  Chronic functional abdominal complaints.  Ongoing 2.  Constipation 3.  GERD 4.  Recent colonoscopy and upper endoscopy without significant abnormalities   PLAN:  1.  I wrote out a list of alternative PPIs for the patient.  She will investigate with her insurance carrier a more affordable alternative.  I also indicated which are available over-the-counter 2.  Reflux precautions 3.  Stop Linzess 4.  Trulance 3 mg  daily.  Prescribed.  Medication risks reviewed.  Samples given. 5.  Routine repeat colonoscopy 10 years 6.  Follow-up as needed

## 2021-01-14 NOTE — Progress Notes (Unsigned)
to 

## 2021-01-19 ENCOUNTER — Telehealth: Payer: Self-pay

## 2021-01-19 NOTE — Telephone Encounter (Signed)
Submitted PA for Pantoprazole through Cover My Meds.  Awaiting response

## 2021-01-20 NOTE — Telephone Encounter (Signed)
Submitted PA for Trulance to Cover My Meds.  Awaiting response

## 2021-01-22 MED ORDER — TRULANCE 3 MG PO TABS: 1.0000 | ORAL_TABLET | Freq: Every day | ORAL | 6 refills | Status: DC

## 2021-01-22 MED ORDER — PANTOPRAZOLE SODIUM 40 MG PO TBEC: 40.0000 mg | DELAYED_RELEASE_TABLET | Freq: Every day | ORAL | 3 refills | Status: DC

## 2021-01-22 NOTE — Telephone Encounter (Signed)
Trulance and Pantoprazole approved through cover my meds.  Resent both prescriptions to the pharmacy with this information.

## 2021-01-25 ENCOUNTER — Other Ambulatory Visit: Payer: Self-pay | Admitting: Nurse Practitioner

## 2021-01-25 DIAGNOSIS — Z8619 Personal history of other infectious and parasitic diseases: Secondary | ICD-10-CM

## 2021-01-26 ENCOUNTER — Ambulatory Visit: Payer: 59 | Admitting: Physician Assistant

## 2021-01-29 ENCOUNTER — Ambulatory Visit: Payer: Self-pay

## 2021-01-29 ENCOUNTER — Encounter: Payer: Self-pay | Admitting: Orthopaedic Surgery

## 2021-01-29 ENCOUNTER — Other Ambulatory Visit: Payer: Self-pay

## 2021-01-29 ENCOUNTER — Ambulatory Visit (INDEPENDENT_AMBULATORY_CARE_PROVIDER_SITE_OTHER): Payer: BC Managed Care – PPO | Admitting: Orthopaedic Surgery

## 2021-01-29 VITALS — Ht 60.0 in | Wt 98.0 lb

## 2021-01-29 DIAGNOSIS — M25551 Pain in right hip: Secondary | ICD-10-CM

## 2021-01-29 DIAGNOSIS — M545 Low back pain, unspecified: Secondary | ICD-10-CM

## 2021-01-29 DIAGNOSIS — M25552 Pain in left hip: Secondary | ICD-10-CM | POA: Diagnosis not present

## 2021-01-29 DIAGNOSIS — M25532 Pain in left wrist: Secondary | ICD-10-CM

## 2021-01-29 NOTE — Progress Notes (Signed)
Office Visit Note   Patient: Ruth Terry           Date of Birth: 1955/11/01           MRN: [id number] Visit Date: 01/29/2021              Requested by: Raliegh Ip, DO [address] [place],  [place] [address] PCP: Raliegh Ip, DO   Assessment & Plan: Visit Diagnoses:  1. Acute bilateral low back pain, unspecified whether sciatica present   2. Bilateral hip pain   3. Pain in left wrist     Plan: X-rays reviewed negative for acute changes she does have chronic degenerative changes with large spur on the trapezium with first CMC arthritis.  We discussed appropriate splint that she can use some of this down.  Exercise program for hips with some stretching and core strengthening.  She can follow-up if she has persistent problems.  Follow-Up Instructions: No follow-ups on file.   Orders:  Orders Placed This Encounter  Procedures   XR Lumbar Spine 2-3 Views   XR HIPS BILAT W OR W/O PELVIS 3-4 VIEWS   XR Wrist Complete Left   No orders of the defined types were placed in this encounter.     Procedures: No procedures performed   Clinical Data: No additional findings.   Subjective: Chief Complaint  Patient presents with   Lower Back - Pain    Fall 12/19/2021   Left Hip - Pain    Fall 12/19/2021   Right Hip - Pain    Fall 12/19/2021   Left Wrist - Pain    Fall 12/19/2021    HPI 66 year old female seen with a fall on 12/19/2021.  She injured her left wrist states is persistently been painful at the base of her thumb.  She states she fell backwards on her back and has had back pain that radiates into her legs and she feels like her legs have been weak.  No falling since that time she is able to walk up and down steps.  Review of Systems patient had past history of rib fractures.  GERD IBS.  History of chronic back pain last seen 08/07/2020.   Objective: Vital Signs: Ht 5' (1.524 m)    Wt 98 lb (44.5 kg)    BMI 19.14 kg/m   Physical  Exam Constitutional:      Appearance: She is well-developed.  HENT:     Head: Normocephalic.     Right Ear: External ear normal.     Left Ear: External ear normal. There is no impacted cerumen.  Eyes:     Pupils: Pupils are equal, round, and reactive to light.  Neck:     Thyroid: No thyromegaly.     Trachea: No tracheal deviation.  Cardiovascular:     Rate and Rhythm: Normal rate.  Pulmonary:     Effort: Pulmonary effort is normal.  Abdominal:     Palpations: Abdomen is soft.  Musculoskeletal:     Cervical back: No rigidity.  Skin:    General: Skin is warm and dry.  Neurological:     Mental Status: She is alert and oriented to person, place, and time.  Psychiatric:        Behavior: Behavior normal.    Ortho Exam patient has some sciatic notch tenderness right and left.  Negative logroll of the hips no hip flexion contracture reflexes are 2+ anterior tib EHL is intact.  Pedal pulses are intact.  Tenderness left wrist over the trapezius.  Good range of motion of the wrist with mild discomfort with radial ulnar deviation.  No tenderness over the distal radius.  Carpal tunnel exam findings are negative.  Specialty Comments:  No specialty comments available.  Imaging: No results found.   PMFS History: Patient Active Problem List   Diagnosis Date Noted   Generalized abdominal pain 01/02/2021   History of shingles 04/24/2019   Chronic midline low back pain without sciatica 02/27/2019   Chronic idiopathic constipation 04/26/2018   Aortic atherosclerosis (HCC) 03/01/2017   Rib fracture 12/07/2016   Gas pain 03/09/2016   Anal itching 03/09/2016   Elevated LDL cholesterol level 10/24/2015   Internal hemorrhoids 05/14/2014   GERD (gastroesophageal reflux disease) 09/17/2011   IBS (irritable bowel syndrome) 08/25/2010   Gastroparesis 06/30/2010   Non-ulcer dyspepsia 04/03/2010   Past Medical History:  Diagnosis Date   Allergy    Anal fissure    Cataract    Chronic  constipation    Diverticulosis 04/20/2010   colonoscopy   Gastroparesis    GERD (gastroesophageal reflux disease)    Hemorrhoids    Hyperlipidemia    IBS (irritable bowel syndrome)    MVP (mitral valve prolapse)    Osteoporosis    Polyp, stomach 04/20/2010   egd   Shingles    UTI (lower urinary tract infection)     Family History  Problem Relation Age of Onset   Hypertension Mother    Diabetes Father    Heart attack Father        Age 30   Colon cancer Neg Hx    Esophageal cancer Neg Hx    Rectal cancer Neg Hx     Past Surgical History:  Procedure Laterality Date   COLONOSCOPY     ESOPHAGOGASTRODUODENOSCOPY     Social History   Occupational History   Occupation: Interior and spatial designer    Comment: [employer] trainer: [employer]  Tobacco Use   Smoking status: Former    Packs/day: 0.50    Years: 6.00    Pack years: 3.00    Types: Cigarettes   Smokeless tobacco: Never   Tobacco comments:    USE SMOKELESS CIGARETTES  Vaping Use   Vaping Use: Every day   Substances: Nicotine  Substance and Sexual Activity   Alcohol use: Yes    Alcohol/week: 0.0 standard drinks    Comment: 1 a week, wine   Drug use: No   Sexual activity: Not Currently

## 2021-02-05 DIAGNOSIS — N3001 Acute cystitis with hematuria: Secondary | ICD-10-CM | POA: Diagnosis not present

## 2021-02-05 DIAGNOSIS — R309 Painful micturition, unspecified: Secondary | ICD-10-CM | POA: Diagnosis not present

## 2021-02-12 DIAGNOSIS — R300 Dysuria: Secondary | ICD-10-CM | POA: Diagnosis not present

## 2021-02-19 DIAGNOSIS — N390 Urinary tract infection, site not specified: Secondary | ICD-10-CM | POA: Diagnosis not present

## 2021-02-19 DIAGNOSIS — R300 Dysuria: Secondary | ICD-10-CM | POA: Diagnosis not present

## 2021-03-05 DIAGNOSIS — N898 Other specified noninflammatory disorders of vagina: Secondary | ICD-10-CM | POA: Diagnosis not present

## 2021-03-13 ENCOUNTER — Telehealth: Payer: Self-pay | Admitting: Internal Medicine

## 2021-03-13 NOTE — Telephone Encounter (Signed)
Patient called in with complaints of generalized abdominal pain, cramping, and nausea for the last 3 days. The pain radiates to her back at times. Her last bowel movement was last night, however she has been very constipated (even with the Trulance & Miralax) and now her hemorrhoid is also inflamed. She has an appointment with [name], PA on 03/30/21, but would like to see if MD has any further recommendations until then. ? ?Dr. Bryan Lemma, please advise. Dr. Henrene Pastor is off today. Thank you! ?

## 2021-03-13 NOTE — Telephone Encounter (Signed)
Inbound call from patient requesting a call from a nurse please.  States is experiencing severe abdominal pain and bloating. ?

## 2021-03-13 NOTE — Telephone Encounter (Signed)
Has had a recent extended work-up for chronic functional abdominal symptoms to include CT, colonoscopy, EGD, and follow-up in the GI clinic on 01/14/2021.  Stopped Linzess and started on Trulance at that time.  Recommendations are as follows: ? ?- Continue Trulance as prescribed ?- Can add extra dose of MiraLAX to facilitate BM ?- Looks like Ruth Terry was previously prescribed Levsin SL 0.125 mg.  Please inquire if Ruth Terry still has these available.  If not, please place new Rx for Levsin SL 0.125 mg Q6H prn abdominal pain, bloating ?- If constipation persists, may benefit from MiraLAX purge ?- For inflamed, symptomatic hemorrhoids, okay to use OTC Preparation H cream ?- Keep follow-up appointment in GI clinic as scheduled ?

## 2021-03-13 NOTE — Telephone Encounter (Signed)
Spoke with patient regarding recommendations & to call back if symptoms persist or worsen. Patient verbalized understanding, no further questions.  ?

## 2021-03-16 ENCOUNTER — Emergency Department (HOSPITAL_COMMUNITY)
Admission: EM | Admit: 2021-03-16 | Discharge: 2021-03-16 | Disposition: A | Discharge status: Home / Self Care | Payer: BC Managed Care – PPO | Attending: Emergency Medicine | Admitting: Emergency Medicine

## 2021-03-16 ENCOUNTER — Encounter (HOSPITAL_COMMUNITY): Payer: Self-pay | Admitting: *Deleted

## 2021-03-16 ENCOUNTER — Emergency Department (HOSPITAL_COMMUNITY): Payer: BC Managed Care – PPO

## 2021-03-16 DIAGNOSIS — R195 Other fecal abnormalities: Secondary | ICD-10-CM | POA: Diagnosis not present

## 2021-03-16 DIAGNOSIS — R109 Unspecified abdominal pain: Secondary | ICD-10-CM | POA: Diagnosis not present

## 2021-03-16 DIAGNOSIS — K5900 Constipation, unspecified: Secondary | ICD-10-CM | POA: Insufficient documentation

## 2021-03-16 DIAGNOSIS — R1084 Generalized abdominal pain: Secondary | ICD-10-CM

## 2021-03-16 DIAGNOSIS — Z743 Need for continuous supervision: Secondary | ICD-10-CM | POA: Diagnosis not present

## 2021-03-16 DIAGNOSIS — R079 Chest pain, unspecified: Secondary | ICD-10-CM | POA: Diagnosis not present

## 2021-03-16 DIAGNOSIS — R188 Other ascites: Secondary | ICD-10-CM | POA: Diagnosis not present

## 2021-03-16 DIAGNOSIS — K529 Noninfective gastroenteritis and colitis, unspecified: Secondary | ICD-10-CM | POA: Insufficient documentation

## 2021-03-16 LAB — URINALYSIS, ROUTINE W REFLEX MICROSCOPIC
Bilirubin Urine: NEGATIVE
Glucose, UA: NEGATIVE mg/dL
Hgb urine dipstick: NEGATIVE
Ketones, ur: NEGATIVE mg/dL
Leukocytes,Ua: NEGATIVE
Nitrite: NEGATIVE
Protein, ur: NEGATIVE mg/dL
Specific Gravity, Urine: 1.003 — ABNORMAL LOW (ref 1.005–1.030)
pH: 6 (ref 5.0–8.0)

## 2021-03-16 LAB — CBC
HCT: 46.8 % — ABNORMAL HIGH (ref 36.0–46.0)
Hemoglobin: 15.2 g/dL — ABNORMAL HIGH (ref 12.0–15.0)
MCH: 31.2 pg (ref 26.0–34.0)
MCHC: 32.5 g/dL (ref 30.0–36.0)
MCV: 96.1 fL (ref 80.0–100.0)
Platelets: 211 10*3/uL (ref 150–400)
RBC: 4.87 MIL/uL (ref 3.87–5.11)
RDW: 13.2 % (ref 11.5–15.5)
WBC: 5 10*3/uL (ref 4.0–10.5)
nRBC: 0 % (ref 0.0–0.2)

## 2021-03-16 LAB — COMPREHENSIVE METABOLIC PANEL
ALT: 12 U/L (ref 0–44)
AST: 17 U/L (ref 15–41)
Albumin: 4.5 g/dL (ref 3.5–5.0)
Alkaline Phosphatase: 63 U/L (ref 38–126)
Anion gap: 12 (ref 5–15)
BUN: 8 mg/dL (ref 8–23)
CO2: 23 mmol/L (ref 22–32)
Calcium: 9.3 mg/dL (ref 8.9–10.3)
Chloride: 105 mmol/L (ref 98–111)
Creatinine, Ser: 0.68 mg/dL (ref 0.44–1.00)
GFR, Estimated: >60 mL/min (ref >60)
Glucose, Bld: 97 mg/dL (ref 70–99)
Potassium: 3.5 mmol/L (ref 3.5–5.1)
Sodium: 140 mmol/L (ref 135–145)
Total Bilirubin: 0.3 mg/dL (ref 0.3–1.2)
Total Protein: 7.3 g/dL (ref 6.5–8.1)

## 2021-03-16 LAB — LIPASE, BLOOD: Lipase: 39 U/L (ref 11–51)

## 2021-03-16 IMAGING — CT CT ABD-PELV W/ CM
2 of 5 series · 16 of 46 positions shown, 18 images · IV contrast (agent unspecified)
Comparison: [DATE]

CLINICAL DATA: Abdominal pain

EXAM:
CT ABDOMEN AND PELVIS WITH CONTRAST
TECHNIQUE: Multidetector CT imaging of the abdomen and pelvis was performed
using the standard protocol following bolus administration of
intravenous contrast.

[Series 2: axial st · axial · 0.72mm/px · z∈[+992,+1332]mm · 13 of 76 slices shown, 15 images]
[im 4/76  soft-tissue]
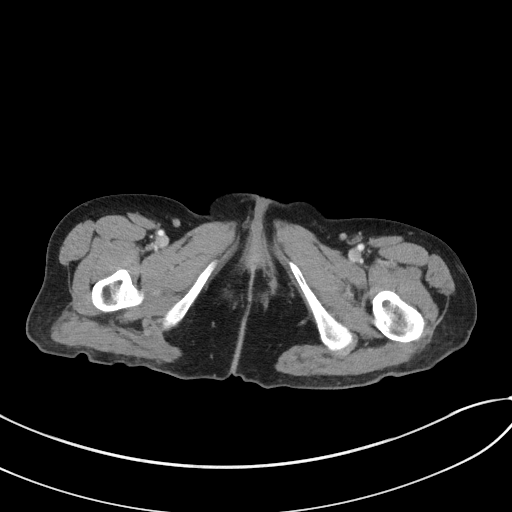
[im 4/76  bone]
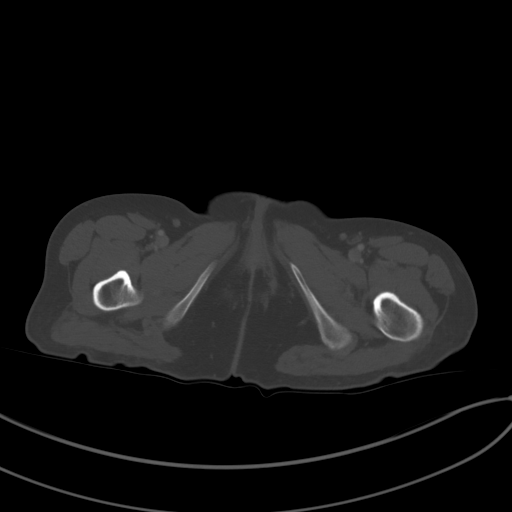
[im 12/76  soft-tissue]
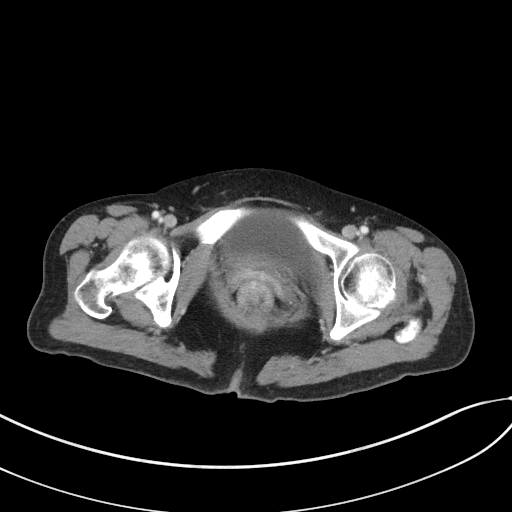
[im 16/76  soft-tissue]
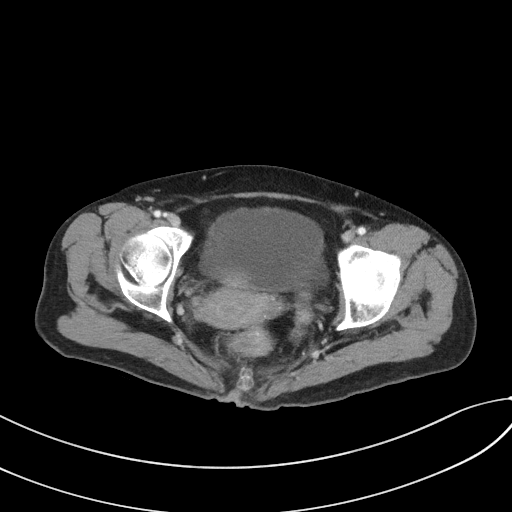
[im 20/76  soft-tissue]
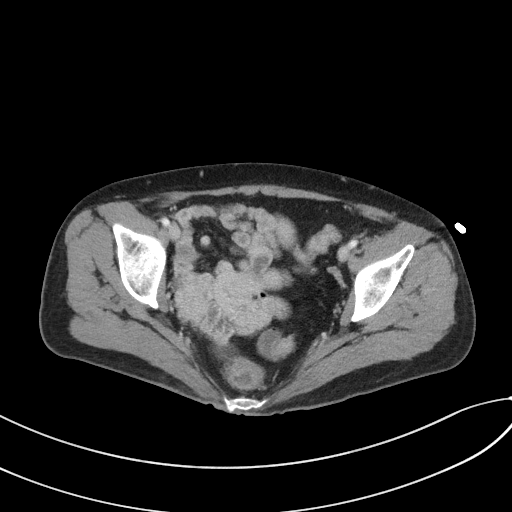
[im 28/76  soft-tissue]
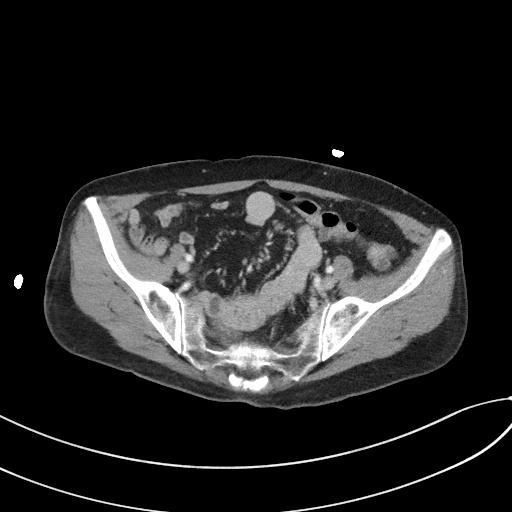
[im 32/76  soft-tissue]
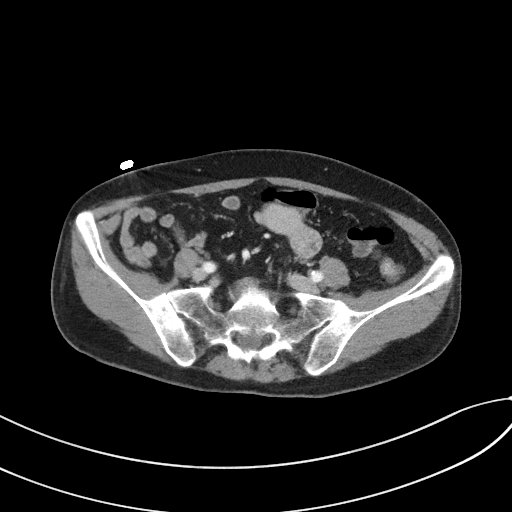
[im 40/76  soft-tissue]
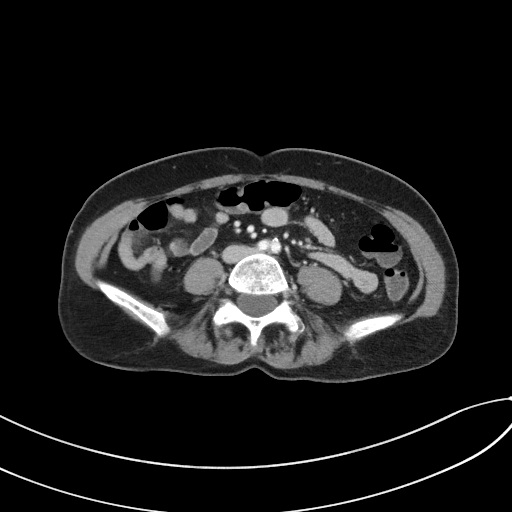
[im 44/76  soft-tissue]
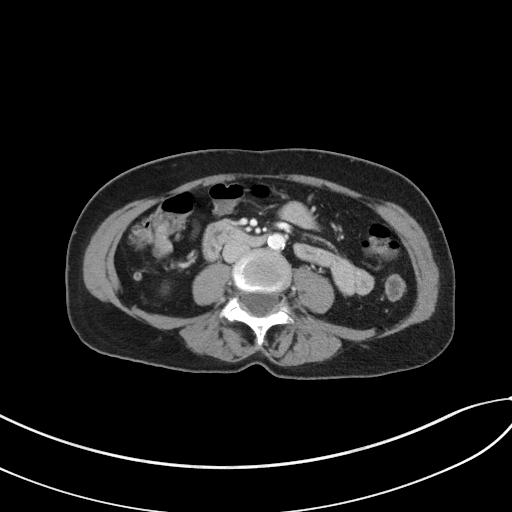
[im 48/76  soft-tissue]
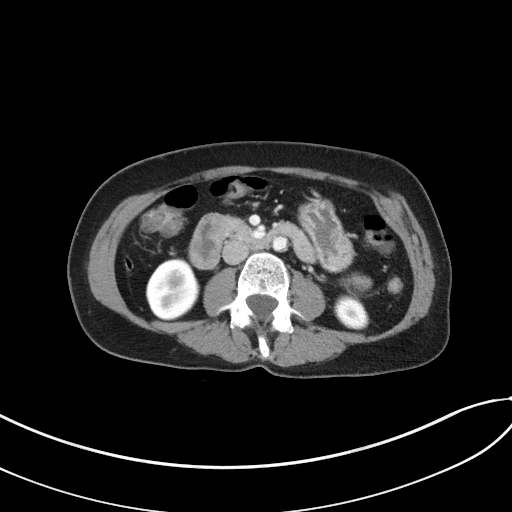
[im 48/76  bone]
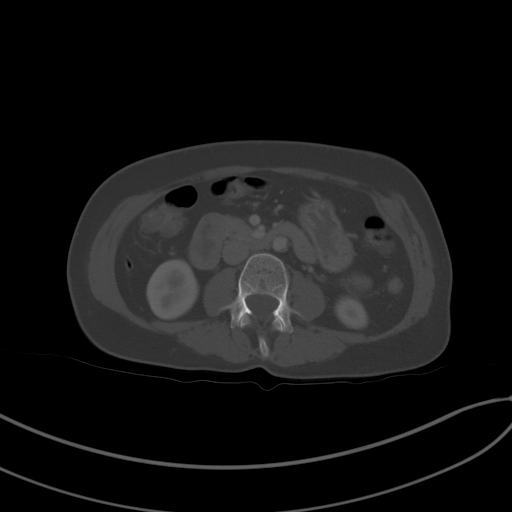
[im 56/76  soft-tissue]
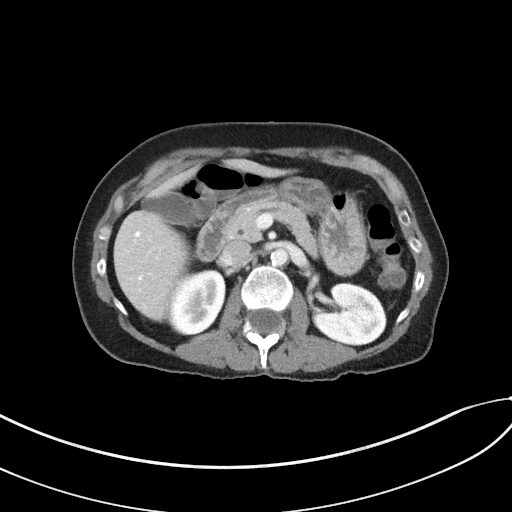
[im 60/76  soft-tissue]
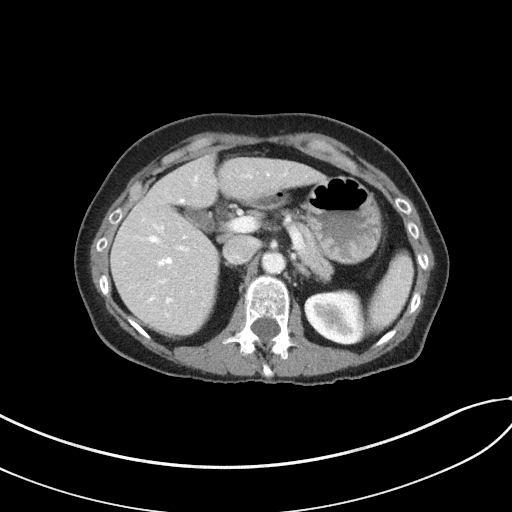
[im 64/76  soft-tissue]
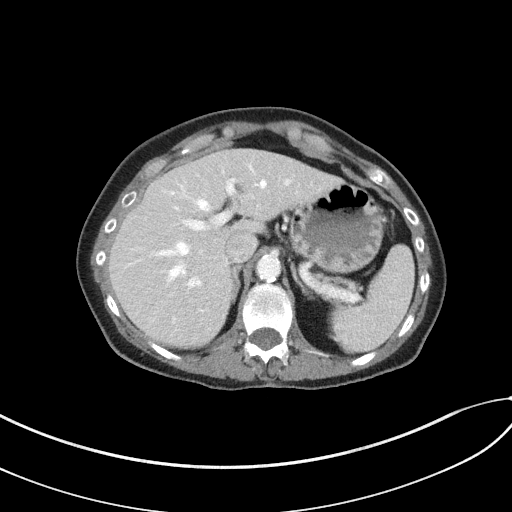
[im 72/76  soft-tissue]
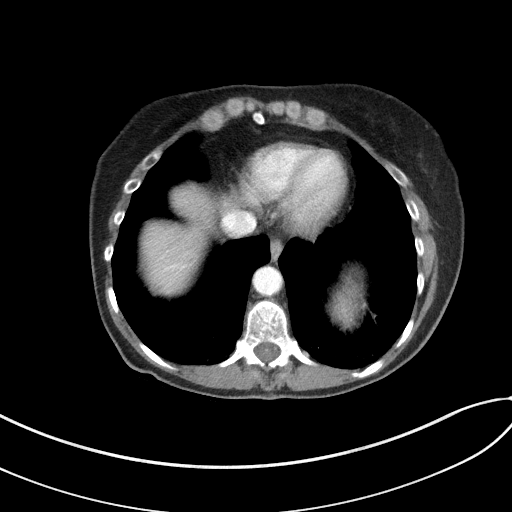

[Series 5: coronal st · coronal · 0.68mm/px · 3 of 89 slices shown]
[im 30/89  soft-tissue]
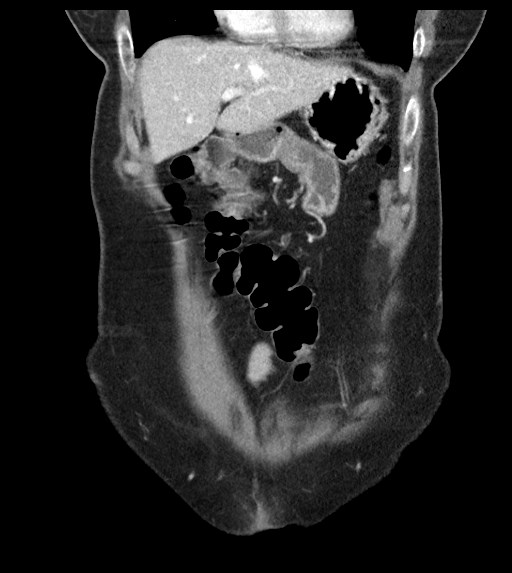
[im 40/89  soft-tissue]
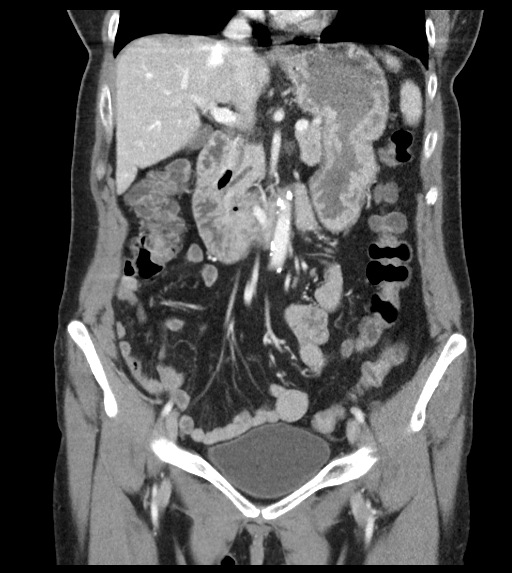
[im 49/89  soft-tissue]
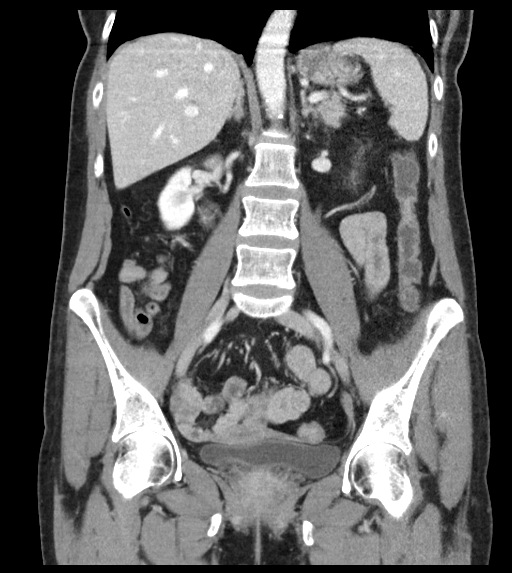

[16 of 46 positions shown; findings below may reference images not displayed]

RADIATION DOSE REDUCTION: This exam was performed according to the
departmental dose-optimization program which includes automated
exposure control, adjustment of the mA and/or kV according to
patient size and/or use of iterative reconstruction technique.

CONTRAST:  75mL OMNIPAQUE IOHEXOL 300 MG/ML  SOLN
FINDINGS: Lower chest: In the image 1 of series 4, there is 3 mm nodular
density inseparable from major fissure in the right lower lung
fields which has not changed significantly. Crowding of interstitial
markings in the posterior lower lung fields has not changed
significantly, possibly suggesting scarring.

Hepatobiliary: No focal abnormality is seen in the liver.
Gallbladder is unremarkable.

Pancreas: There is mild dilation of pancreatic duct. No focal
abnormality is seen in the pancreas. There is diverticulum along the
inner margin of duodenum.

Spleen: Unremarkable.

Adrenals/Urinary Tract: Adrenals are not enlarged. There is no
hydronephrosis. There are no renal or ureteral stones.

Stomach/Bowel: Stomach is unremarkable. Small bowel loops are not
dilated. Appendix is not dilated. There is no significant wall
thickening in the colon. There is no pericolic stranding. Small
amount of liquid stool is seen in the colon.

Vascular/Lymphatic: There are scattered atherosclerotic plaques and
calcifications in the aorta and its major branches.

Reproductive: Unremarkable.

Other: There is no ascites or pneumoperitoneum. Small umbilical
hernia containing fat is seen.

Musculoskeletal: Unremarkable.
IMPRESSION: There is no evidence of intestinal obstruction or pneumoperitoneum.
There is no hydronephrosis. Appendix is not dilated.

Small amount of fluid is seen in the lumen of colon which may be
normal variation or suggest nonspecific colitis. There is no
significant wall thickening in colon.

Other findings as described in the body of the report.

## 2021-03-16 MED ORDER — ONDANSETRON HCL 4 MG/2ML IJ SOLN
4.0000 mg | Freq: Once | INTRAMUSCULAR | Status: AC
  Administered 2021-03-16: 4 mg via INTRAVENOUS
  Filled 2021-03-16: qty 2

## 2021-03-16 MED ORDER — SODIUM CHLORIDE 0.9 % IV BOLUS
500.0000 mL | Freq: Once | INTRAVENOUS | Status: AC
  Administered 2021-03-16: 500 mL via INTRAVENOUS

## 2021-03-16 MED ORDER — FENTANYL CITRATE PF 50 MCG/ML IJ SOSY
50.0000 ug | PREFILLED_SYRINGE | Freq: Once | INTRAMUSCULAR | Status: AC
  Administered 2021-03-16: 50 ug via INTRAVENOUS
  Filled 2021-03-16: qty 1

## 2021-03-16 MED ORDER — IOHEXOL 300 MG/ML  SOLN
100.0000 mL | Freq: Once | INTRAMUSCULAR | Status: AC | PRN
  Administered 2021-03-16: 75 mL via INTRAVENOUS

## 2021-03-16 NOTE — Discharge Instructions (Signed)
You were seen in the emergency department for abdominal pain rating into her back.  You had lab work and a urinalysis that did not show any significant abnormalities.  You had a CAT scan of your abdomen and pelvis that also did not show a clear explanation for your symptoms.  Please continue your regular medications and follow-up with your primary care doctor and GI team.  Return to the emergency department if any high fevers or worsening symptoms. ?

## 2021-03-16 NOTE — ED Triage Notes (Signed)
Recurrent abdominal pain 

## 2021-03-16 NOTE — ED Provider Notes (Incomplete)
[facility] Provider Note   CSN: [id number] Arrival date & time: 03/16/21  1245     History {Add pertinent medical, surgical, social history, OB history to HPI:1} Chief Complaint  Patient presents with   Abdominal Pain    Ruth Terry is a 66 y.o. female.  She has a history of chronic abdominal pain IBS gastroparesis.  She said she had a UTI few weeks ago and was treated with antibiotics but did not seem to get better.  Her doctor was supposedly setting her up for a scan.  Continues to have abdominal pain wrapping around her ribs through to her back.  Kept her up all day long.  She did contact her GI doctor who told her to adjust her constipation medications.  She still feels constipated.  No fevers chills chest pain shortness of breath.  She does not have any dysuria but complains of frequent urination and only passing a small amount.   Abdominal Pain Pain location:  Generalized Pain quality: aching   Pain radiates to:  Back Pain severity:  Severe Onset quality:  Gradual Duration:  2 weeks Timing:  Constant Progression:  Unchanged Chronicity:  Recurrent Context: not trauma   Relieved by:  Nothing Worsened by:  Nothing Ineffective treatments:  Bowel activity Associated symptoms: constipation and nausea   Associated symptoms: no chest pain, no cough, no diarrhea, no dysuria, no fever, no hematuria, no shortness of breath, no sore throat, no vaginal bleeding and no vomiting       Home Medications Prior to Admission medications   Medication Sig Start Date End Date Taking? Authorizing Provider  Cholecalciferol (VITAMIN D-3) 25 MCG (1000 UT) CAPS Take 1 capsule by mouth daily.    [provider]  DULoxetine (CYMBALTA) 60 MG capsule Take 1 capsule (60 mg total) by mouth daily. 08/22/20   [name], DO  hydrocortisone cream 1 % Apply 1 application topically 2 (two) times daily. 03/27/20   Gwenlyn Perking, FNP  hyoscyamine (LEVSIN SL)  0.125 MG SL tablet Place 1 tablet (0.125 mg total) under the tongue every 6 (six) hours as needed. 09/25/20   Esterwood, [name] S, PA-C  ibandronate (BONIVA) 150 MG tablet Take 1 tablet (150 mg total) by mouth every 30 (thirty) days. Take in the morning with a full glass of water, on an empty stomach, and do not take anything else by mouth or lie down for the next 30 min. 12/11/19   [name], DO  levocetirizine (XYZAL) 5 MG tablet Take 1 tablet (5 mg total) by mouth every evening. For allergies 08/22/20   [name], DO  linaclotide [facility]) 145 MCG CAPS capsule Take 1 capsule (145 mcg total) by mouth daily before breakfast. 02/11/20   [name], DO  meloxicam (MOBIC) 15 MG tablet Take 1 tablet (15 mg total) by mouth daily as needed for pain. 11/08/20   Isla Pence, MD  Pancrelipase, Lip-Prot-Amyl, (ZENPEP) 25000-79000 units CPEP Take 2 capsules by mouth 3 (three) times daily with meals. Patient taking differently: Take 1 capsule by mouth 2 (two) times daily with a meal. 07/31/20 01/14/21  Esterwood, [name] S, PA-C  pantoprazole (PROTONIX) 40 MG tablet Take 1 tablet (40 mg total) by mouth daily. 01/22/21   Irene Shipper, MD  Plecanatide (TRULANCE) 3 MG TABS Take 1 tablet by mouth daily. 01/22/21   Irene Shipper, MD  Probiotic Product (ALIGN) 4 MG CAPS Take 1 capsule by mouth daily.  [provider]  psyllium (METAMUCIL) 58.6 % packet Take 1 packet by mouth daily.    [provider]  Rectal Protectant-Emollient (CALMOL-4) 76-10 % SUPP Place 1 suppository rectally as needed (for hemmorrhoids). 03/27/20   Gwenlyn Perking, FNP  Simethicone (PHAZYME MAXIMUM STRENGTH) 250 MG CAPS Take 250 mg by mouth 3 (three) times daily. Patient taking differently: Take 250 mg by mouth 3 (three) times daily as needed (gas relief). 02/20/16   Terald Sleeper, PA-C  valACYclovir (VALTREX) 1000 MG tablet TAKE 1 TABLET (1,000 MG TOTAL) BY MOUTH AT BEDTIME. 01/26/21   Hassell Done, Mary-Margaret,  FNP  AMBULATORY NON FORMULARY MEDICATION Medication Name: Domperidone 10 mg Take 1 tablet at bedtime daily. Patient taking differently: Take 1 tablet by mouth at bedtime. Medication Name: Domperidone 10 mg Take 1 tablet at bedtime daily. 09/08/16 05/15/19  Esterwood, [name] S, PA-C      Allergies    Chlordiazepoxide-clidinium, Ciprofloxacin, Flagyl [metronidazole], Sulfa drugs cross reactors, Cefdinir, and Nitrofurantoin monohyd macro    Review of Systems   Review of Systems  Constitutional:  Negative for fever.  HENT:  Negative for sore throat.   Respiratory:  Negative for cough and shortness of breath.   Cardiovascular:  Negative for chest pain.  Gastrointestinal:  Positive for abdominal pain, constipation and nausea. Negative for diarrhea and vomiting.  Genitourinary:  Positive for frequency. Negative for dysuria, hematuria and vaginal bleeding.  Musculoskeletal:  Positive for back pain.  Skin:  Negative for rash.  Neurological:  Positive for headaches.   Physical Exam Updated Vital Signs BP 135/82 (BP Location: Right Arm)    Pulse 90    Temp 97.8 F (36.6 C) (Oral)    Resp 18    SpO2 96%  Physical Exam Vitals and nursing note reviewed.  Constitutional:      General: She is not in acute distress.    Appearance: She is well-developed.  HENT:     Head: Normocephalic and atraumatic.  Eyes:     Conjunctiva/sclera: Conjunctivae normal.  Cardiovascular:     Rate and Rhythm: Normal rate and regular rhythm.     Heart sounds: No murmur heard. Pulmonary:     Effort: Pulmonary effort is normal. No respiratory distress.     Breath sounds: Normal breath sounds.  Abdominal:     Palpations: Abdomen is soft.     Tenderness: There is no abdominal tenderness. There is no guarding or rebound.  Musculoskeletal:        General: No swelling.     Cervical back: Neck supple.  Skin:    General: Skin is warm and dry.     Capillary Refill: Capillary refill takes less than 2 seconds.   Neurological:     General: No focal deficit present.     Mental Status: She is alert.    ED Results / Procedures / Treatments   Labs (all labs ordered are listed, but only abnormal results are displayed) Labs Reviewed  CBC - Abnormal; Notable for the following components:      Result Value   Hemoglobin 15.2 (*)    HCT 46.8 (*)    All other components within normal limits  URINALYSIS, ROUTINE W REFLEX MICROSCOPIC - Abnormal; Notable for the following components:   Color, Urine STRAW (*)    Specific Gravity, Urine 1.003 (*)    All other components within normal limits  LIPASE, BLOOD  COMPREHENSIVE METABOLIC PANEL    EKG None  Radiology No results found.  Procedures Procedures  {  Document cardiac monitor, telemetry assessment procedure when appropriate:1}  Medications Ordered in ED Medications - No data to display  ED Course/ Medical Decision Making/ A&P                           Medical Decision Making Amount and/or Complexity of Data Reviewed Labs: ordered.   ***  {Document critical care time when appropriate:1} {Document review of labs and clinical decision tools ie heart score, Chads2Vasc2 etc:1}  {Document your independent review of radiology images, and any outside records:1} {Document your discussion with family members, caretakers, and with consultants:1} {Document social determinants of health affecting pt's care:1} {Document your decision making why or why not admission, treatments were needed:1} Final Clinical Impression(s) / ED Diagnoses Final diagnoses:  None    Rx / DC Orders ED Discharge Orders     None

## 2021-03-17 NOTE — Telephone Encounter (Signed)
Spoke with pt and she is aware.

## 2021-03-17 NOTE — Telephone Encounter (Signed)
Pt was seen in the er yesterday. Reports she had a CT scan and was told she had colitis. She is also having nausea, lots of heartburn, pain in her stomach back and ribs. She is passing liquid brown stool and reports her rectum is killing her. She has an appt with [facility] 03/26/21. Pt would like for Dr. Henrene Pastor to review her records from her ER visit yesterday and see what he recommends. Please advise. ?

## 2021-03-17 NOTE — Telephone Encounter (Signed)
Inbound call from patient states she was recently in hospital and had CT scans. States she is in a lot of pain ?

## 2021-03-17 NOTE — Telephone Encounter (Signed)
Spoke with pt and let her know Dr. Henrene Pastor does not think the CT shows colitis. She wants to know what to do about her liquid stool. Reports she has to strain and all she passes is brown liquid diarrhea. She is still taking Trulance. Reports she is in a lot of pain in her rectum. She is not sure if it is hemorrhoids or not. Suggested she try recticare otc. She wants to know what she should do to help get rid of the liquid stool. Please advise. ?

## 2021-03-17 NOTE — Telephone Encounter (Signed)
Reviewed. ?CT unimpressive.  Does not look like colitis.  Likely normal.  Laboratories unremarkable. ?Cause for her ongoing symptoms without identified organic basis. ?No new recommendations. ?Okay to keep follow-up with [name] ?

## 2021-03-17 NOTE — Telephone Encounter (Signed)
2 tablespoons of Citrucel every day ?

## 2021-03-18 ENCOUNTER — Ambulatory Visit: Payer: BC Managed Care – PPO | Admitting: Nurse Practitioner

## 2021-03-19 ENCOUNTER — Other Ambulatory Visit: Payer: Self-pay

## 2021-03-26 ENCOUNTER — Other Ambulatory Visit: Payer: BC Managed Care – PPO

## 2021-03-26 ENCOUNTER — Ambulatory Visit: Payer: BC Managed Care – PPO | Admitting: Physician Assistant

## 2021-03-26 ENCOUNTER — Encounter: Payer: Self-pay | Admitting: Physician Assistant

## 2021-03-26 VITALS — BP 106/56 | HR 64 | Ht 60.0 in | Wt 96.6 lb

## 2021-03-26 DIAGNOSIS — R1013 Epigastric pain: Secondary | ICD-10-CM | POA: Diagnosis not present

## 2021-03-26 DIAGNOSIS — R9389 Abnormal findings on diagnostic imaging of other specified body structures: Secondary | ICD-10-CM

## 2021-03-26 DIAGNOSIS — K648 Other hemorrhoids: Secondary | ICD-10-CM | POA: Diagnosis not present

## 2021-03-26 DIAGNOSIS — K5909 Other constipation: Secondary | ICD-10-CM

## 2021-03-26 DIAGNOSIS — K582 Mixed irritable bowel syndrome: Secondary | ICD-10-CM | POA: Diagnosis not present

## 2021-03-26 MED ORDER — HYDROCORTISONE ACETATE 25 MG RE SUPP: 25.0000 mg | Freq: Every evening | RECTAL | 2 refills | Status: AC

## 2021-03-26 MED ORDER — LINACLOTIDE 145 MCG PO CAPS: 145.0000 ug | ORAL_CAPSULE | Freq: Every day | ORAL | 11 refills | Status: DC

## 2021-03-26 MED ORDER — DULOXETINE HCL 30 MG PO CPEP: 30.0000 mg | ORAL_CAPSULE | Freq: Every day | ORAL | 6 refills | Status: DC

## 2021-03-26 NOTE — Progress Notes (Signed)
? ?Subjective:  ? ? Patient ID: Ruth Terry, female    DOB: 25-Nov-1955, 66 y.o.   MRN: [id number] ? ?HPI ?Ruth Terry is a 66 year old white female, established with Dr. Marina Goodell and known to myself.  She has diagnosis of chronic dyspepsia, IBS, GERD, previous diagnosis of gastroparesis, chronic constipation, diverticulosis, and has had an abnormal fecal elastase. ?She was last seen in the office in January 2023 per Dr. Marina Goodell at that time she was switched from Dexilant to pantoprazole 40 mg p.o. daily, at her request due to high cost of Dexilant.  And although Linzess had been helpful she had asked about a trial of Trulance and was started on Trulance 3 mg p.o. daily. ?She had undergone EGD and colonoscopy in December 2022.  EGD was normal and colonoscopy showed no polyps, small internal hemorrhoids and a few sigmoid diverticuli. ?She had a recent ER visit on 03/16/2021 with complaints of severe abdominal pain.  Labs showed WBC of 5, hemoglobin 15.2 c-Met unremarkable and lipase normal ?CT of the abdomen and pelvis with contrast which showed an unremarkable gallbladder and liver, mild dilation of the pancreatic duct no focal abnormality seen in the pancreas, small diverticulum along the inner margin of the duodenum, diverticulosis, and a small amount of liquid stool noted in the lumen of the colon. ? ?Patient says that she has not been feeling well at all over the past month or so, has been miserable with ongoing abdominal pain, and complains of pain now in the mid to upper abdomen radiating into the back which has been constant over the past 4 to 5 days.  She is worried about her pancreas and the abnormality noted on the CT. ?She says she has retired, but is still working though from home and that she is much less stressed than she had been in the past.  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 ?He has been having severe problems with constipation says she is spending a lot of time in the bathroom with  straining and is trying to have a bowel movement and sitting in the bathroom at least 6 or 8 times per day.  She says sometimes with a lot of straining she will pass a small amount of stool like she did this morning which is narrow and lumpy.  With straining then she develops increased hemorrhoidal symptoms and discomfort.  She has been using some glycerin suppositories as needed.  She has not been taking Zenpep recently as it did not really agree with her. ? ?Review of Systems.Pertinent positive and negative review of systems were noted in the above HPI section.  All other review of systems was otherwise negative.  ? ?Outpatient Encounter Medications as of 03/26/2021  ?Medication Sig  ? Cholecalciferol (VITAMIN D-3) 25 MCG (1000 UT) CAPS Take 1 capsule by mouth daily.  ? hydrocortisone (ANUSOL-HC) 25 MG suppository Place 1 suppository (25 mg total) rectally at bedtime for 5 days.  ? hydrocortisone cream 1 % Apply 1 application topically 2 (two) times daily.  ? hyoscyamine (LEVSIN SL) 0.125 MG SL tablet Place 1 tablet (0.125 mg total) under the tongue every 6 (six) hours as needed.  ? ibandronate (BONIVA) 150 MG tablet Take 1 tablet (150 mg total) by mouth every 30 (thirty) days. Take in the morning with a full glass of water, on an empty stomach, and do not take anything else by mouth or lie down for the next 30 min.  ? levocetirizine (XYZAL) 5 MG tablet  Take 1 tablet (5 mg total) by mouth every evening. For allergies  ? meloxicam (MOBIC) 15 MG tablet Take 1 tablet (15 mg total) by mouth daily as needed for pain.  ? pantoprazole (PROTONIX) 40 MG tablet Take 1 tablet (40 mg total) by mouth daily.  ? Probiotic Product (ALIGN) 4 MG CAPS Take 1 capsule by mouth daily.  ? psyllium (METAMUCIL) 58.6 % packet Take 1 packet by mouth daily.  ? Rectal Protectant-Emollient (CALMOL-4) 76-10 % SUPP Place 1 suppository rectally as needed (for hemmorrhoids).  ? Simethicone (PHAZYME MAXIMUM STRENGTH) 250 MG CAPS Take 250 mg by mouth  3 (three) times daily. (Patient taking differently: Take 250 mg by mouth 3 (three) times daily as needed (gas relief).)  ? valACYclovir (VALTREX) 1000 MG tablet TAKE 1 TABLET (1,000 MG TOTAL) BY MOUTH AT BEDTIME.  ? [DISCONTINUED] DULoxetine (CYMBALTA) 60 MG capsule Take 1 capsule (60 mg total) by mouth daily.  ? [DISCONTINUED] linaclotide (LINZESS) 145 MCG CAPS capsule Take 1 capsule (145 mcg total) by mouth daily before breakfast.  ? [DISCONTINUED] Plecanatide (TRULANCE) 3 MG TABS Take 1 tablet by mouth daily.  ? DULoxetine (CYMBALTA) 30 MG capsule Take 1 capsule (30 mg total) by mouth daily.  ? linaclotide (LINZESS) 145 MCG CAPS capsule Take 1 capsule (145 mcg total) by mouth daily before breakfast.  ? Pancrelipase, Lip-Prot-Amyl, (ZENPEP) 25000-79000 units CPEP Take 2 capsules by mouth 3 (three) times daily with meals. (Patient taking differently: Take 1 capsule by mouth 2 (two) times daily with a meal.)  ? [DISCONTINUED] AMBULATORY NON FORMULARY MEDICATION Medication Name: Domperidone 10 mg ?Take 1 tablet at bedtime daily. (Patient taking differently: Take 1 tablet by mouth at bedtime. Medication Name: Domperidone 10 mg ?Take 1 tablet at bedtime daily.)  ? ?Facility-Administered Encounter Medications as of 03/26/2021  ?Medication  ? methylPREDNISolone acetate (DEPO-MEDROL) injection 40 mg  ? ?Allergies  ?Allergen Reactions  ? Chlordiazepoxide-Clidinium Itching  ? Ciprofloxacin Itching  ?  Irritates stomach  ? Flagyl [Metronidazole]   ?  Severe yeast infection  ? Sulfa Drugs Cross Reactors Nausea Only  ? Cefdinir Nausea And Vomiting  ? Nitrofurantoin Monohyd Macro Rash  ? ?Patient Active Problem List  ? Diagnosis Date Noted  ? Generalized abdominal pain 01/02/2021  ? History of shingles 04/24/2019  ? Chronic midline low back pain without sciatica 02/27/2019  ? Chronic idiopathic constipation 04/26/2018  ? Aortic atherosclerosis (HCC) 03/01/2017  ? Rib fracture 12/07/2016  ? Gas pain 03/09/2016  ? Anal itching  03/09/2016  ? Elevated LDL cholesterol level 10/24/2015  ? Internal hemorrhoids 05/14/2014  ? GERD (gastroesophageal reflux disease) 09/17/2011  ? IBS (irritable bowel syndrome) 08/25/2010  ? Gastroparesis 06/30/2010  ? Non-ulcer dyspepsia 04/03/2010  ? ?Social History  ? ?Socioeconomic History  ? Marital status: Married  ?  Spouse name: Not on file  ? Number of children: 0  ? Years of education: Not on file  ? Highest education level: Not on file  ?Occupational History  ? Occupation: Interior and spatial designer  ?  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  Employer: [employer]  ?Tobacco Use  ? Smoking status: Former  ?  Packs/day: 0.50  ?  Years: 6.00  ?  Pack years: 3.00  ?  Types: Cigarettes  ? Smokeless tobacco: Never  ? Tobacco comments:  ?  USE SMOKELESS CIGARETTES  ?Vaping Use  ? Vaping Use: Every day  ? Substances: Nicotine  ?Substance and Sexual Activity  ? Alcohol use: Yes  ?  Alcohol/week: 0.0 standard drinks  ?  Comment: 1 a week, wine  ? Drug use: No  ? Sexual activity: Not Currently  ?Other Topics Concern  ? Not on file  ?Social History Narrative  ? Not on file  ? ?Social Determinants of Health  ? ?Financial Resource Strain: Not on file  ?Food Insecurity: Not on file  ?Transportation Needs: Not on file  ?Physical Activity: Not on file  ?Stress: Not on file  ?Social Connections: Not on file  ?Intimate Partner Violence: Not on file  ? ? ?Ms. Cecena's family history includes Diabetes in her father; Heart attack in her father; Hypertension in her mother. ? ? ?   ?Objective:  ?  ?Vitals:  ? 03/26/21 1407  ?BP: (!) 106/56  ?Pulse: 64  ? ? ?Physical Exam Well-developed well-nourished older female in no acute distress.  , Weight, 96 BMI 18.8 ? ?HEENT; nontraumatic normocephalic, EOMI, PE R LA, sclera anicteric. ?Oropharynx; not examined today ?Neck; supple, no JVD ?Cardiovascular; regular rate and rhythm with S1-S2, no murmur rub or gallop ?Pulmonary; Clear bilaterally ?Abdomen; soft,  nondistended, no palpable  mass or hepatosplenomegaly, bowel sounds are active, rather diffuse abdominal tenderness, more prominent in the mid and upper abdomen no guarding ?Rectal; external exam today and digital exam, no anal fissure a

## 2021-03-26 NOTE — Patient Instructions (Signed)
If you are age 64 or younger, your body mass index should be between 19-25. Your Body mass index is 18.87 kg/m?Marland Kitchen If this is out of the aformentioned range listed, please consider follow up with your Primary Care Provider.  ?________________________________________________________ ? ?The [place] [facility] providers would like to encourage you to use [facility] to communicate with providers for non-urgent requests or questions.  Due to long hold times on the telephone, sending your provider a message by [facility] may be a faster and more efficient way to get a response.  Please allow 48 business hours for a response.  Please remember that this is for non-urgent requests.  ?_______________________________________________________ ? ?Your provider has requested that you go to the basement level for lab work before leaving today. Press "B" on the elevator. The lab is located at the first door on the left as you exit the elevator. ? ?You have been scheduled for an MRI at [facility] on 04/15/2021. Your appointment time is 10:00 am. Please arrive to admitting (at main entrance of the hospital) 15 minutes prior to your appointment time for registration purposes. Please make certain not to have anything to eat or drink 6 hours prior to your test. In addition, if you have any metal in your body, have a pacemaker or defibrillator, please be sure to let your ordering physician know. This test typically takes 45 minutes to 1 hour to complete. Should you need to reschedule, please call [phone] to do so. ? ?RESTART Linzess 145 mcg 1 capsule every morning ?RESTART Cymbalta 30 mg 1 capsule every morning ? ?STOP Trulance ? ?Stay off Zenpep ? ?Continue Pantoprazole 40 mg 1 tablet every morning ? ?START Anusol Suppositories 1 suppository at bedtime for 5 days ? ?[name] Esterwood, PA-C recommends that you complete a bowel purge (to clean out your bowels). Please do the following: ?You will drink 4 capfuls of Miralax mixed in an adequate amount of  water/juice/gatorade (you may choose which of these liquids to drink) over the next 2-3 hours. ?You should expect results within 1 to 6 hours after completing the bowel purge. ? ?Thank you for entrusting me with your care and choosing [facility]. ? ?[name] Esterwood, PA-C ? ?

## 2021-03-30 ENCOUNTER — Ambulatory Visit: Payer: BC Managed Care – PPO | Admitting: Physician Assistant

## 2021-03-30 ENCOUNTER — Other Ambulatory Visit: Payer: Self-pay | Admitting: Physician Assistant

## 2021-03-30 ENCOUNTER — Telehealth: Payer: Self-pay

## 2021-03-30 ENCOUNTER — Other Ambulatory Visit: Payer: Self-pay

## 2021-03-30 DIAGNOSIS — R1084 Generalized abdominal pain: Secondary | ICD-10-CM

## 2021-03-30 DIAGNOSIS — R197 Diarrhea, unspecified: Secondary | ICD-10-CM

## 2021-03-30 NOTE — Telephone Encounter (Signed)
Patient took her stool specimen to [facility] in [place]. They are calling for an order. Printed out new lab requisition for [facility] and faxed to [phone]. ?Order is for fecal elastase. ?

## 2021-03-31 ENCOUNTER — Telehealth: Payer: Self-pay

## 2021-03-31 NOTE — Progress Notes (Signed)
Assessment and plans reviewed ?Very high utilizer of healthcare in this clinic with unsatisfactory results.  May need tertiary care referral ?

## 2021-03-31 NOTE — Telephone Encounter (Signed)
Prior Authorization started for Linzess ?

## 2021-04-01 LAB — SPECIMEN STATUS REPORT

## 2021-04-01 LAB — PANCREATIC ELASTASE, FECAL: Pancreatic Elastase, Fecal: 122 ug Elast./g — ABNORMAL LOW (ref >200)

## 2021-04-03 ENCOUNTER — Telehealth: Payer: Self-pay | Admitting: Physician Assistant

## 2021-04-03 NOTE — Telephone Encounter (Signed)
Patient advised.

## 2021-04-03 NOTE — Telephone Encounter (Signed)
Spoke with the patient. Nothing is better. Reports "pain in my upper abdomen." She did purge. Feels that was somewhat successful. She has complaints of gas cramps.  ?Today her main concern is that she has seen her labs and wants to restart the pancreatic enzyme base on this. MRI is scheduled.  ?Encourage the patient to continue with the recommendations from her office visit. I will let the provider know of her concerns. ?

## 2021-04-03 NOTE — Telephone Encounter (Signed)
Patient's linzess has been approved, 03/31/2021-03/30/2022 ?

## 2021-04-03 NOTE — Telephone Encounter (Signed)
Patient called to discuss lab results. I advised the patient that she will receive a call once the provider has seen/signed off on results. Patient states that she had an OV 03/26/21 and was told if her symptoms did not get better to let us know. Please advise.  ?

## 2021-04-15 ENCOUNTER — Other Ambulatory Visit: Payer: Self-pay | Admitting: Physician Assistant

## 2021-04-15 ENCOUNTER — Ambulatory Visit (HOSPITAL_COMMUNITY)
Admission: RE | Admit: 2021-04-15 | Discharge: 2021-04-15 | Disposition: A | Discharge status: Home / Self Care | Payer: BC Managed Care – PPO | Source: Ambulatory Visit | Attending: Physician Assistant | Admitting: Physician Assistant

## 2021-04-15 DIAGNOSIS — K5909 Other constipation: Secondary | ICD-10-CM | POA: Insufficient documentation

## 2021-04-15 DIAGNOSIS — R1013 Epigastric pain: Secondary | ICD-10-CM

## 2021-04-15 DIAGNOSIS — K582 Mixed irritable bowel syndrome: Secondary | ICD-10-CM | POA: Diagnosis not present

## 2021-04-15 DIAGNOSIS — K648 Other hemorrhoids: Secondary | ICD-10-CM | POA: Insufficient documentation

## 2021-04-15 DIAGNOSIS — R197 Diarrhea, unspecified: Secondary | ICD-10-CM | POA: Diagnosis not present

## 2021-04-15 DIAGNOSIS — R9389 Abnormal findings on diagnostic imaging of other specified body structures: Secondary | ICD-10-CM

## 2021-04-15 DIAGNOSIS — K8689 Other specified diseases of pancreas: Secondary | ICD-10-CM | POA: Diagnosis not present

## 2021-04-15 DIAGNOSIS — R935 Abnormal findings on diagnostic imaging of other abdominal regions, including retroperitoneum: Secondary | ICD-10-CM | POA: Diagnosis not present

## 2021-04-15 IMAGING — MR MR ABDOMEN WO/W CM MRCP
22 of 23 series · 46 of 48 positions shown · IV contrast (gadavist)
Comparison: CT abdomen and pelvis [DATE]

CLINICAL DATA: Pancreatic ductal dilatation on CT, diarrhea



[Series 3: cor haste · coronal · 6.0mm · 1.25mm/px · 1 of 26 slices shown]
[im 1/26]
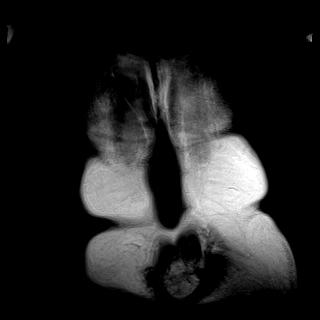

[Series 4: T2 fat-sat · axial · 6.0mm · 1.19mm/px · 1 of 30 slices shown]
[im 1/30]
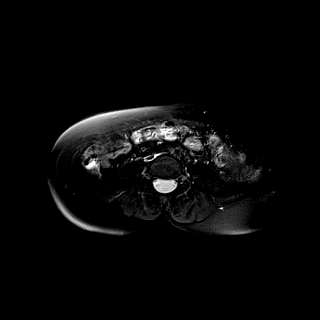

[Series 5: DWI · axial · 6.0mm · 1.42mm/px · 1 of 28 slices shown (1 of 4)]
[im 1/28]
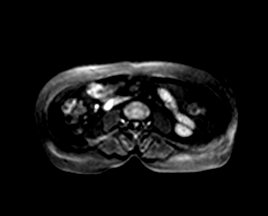

[Series 5: DWI · axial · 6.0mm · 1.42mm/px · 1 of 28 slices shown (2 of 4)]
[im 1/28]
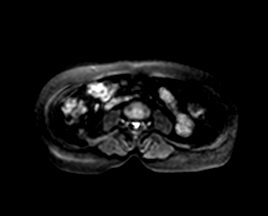

[Series 5: DWI · axial · 6.0mm · 1.42mm/px · 1 of 28 slices shown (3 of 4)]
[im 1/28]
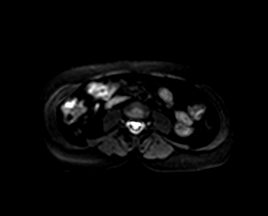

[Series 6: DWI · axial · 6.0mm · 1.42mm/px · 1 of 28 slices shown (4 of 4)]
[im 1/28]
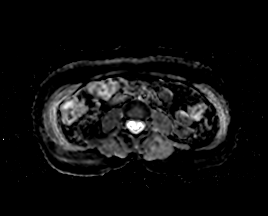

[Series 10: ax in and · axial · 3.0mm · 1.25mm/px · z∈[-125,+87]mm · 3 of 72 slices shown (1 of 2)]
[im 1/72]
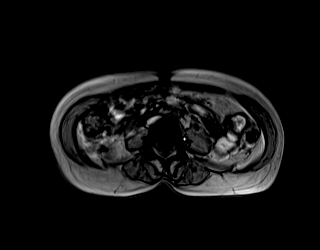
[im 36/72]
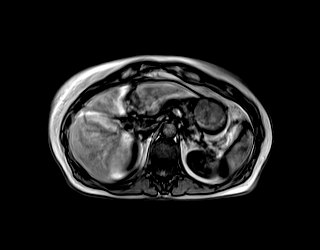
[im 72/72]
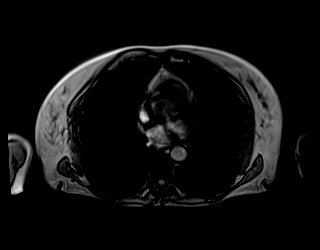

[Series 11: ax in and · axial · 3.0mm · 1.25mm/px · z∈[-125,+87]mm · 3 of 72 slices shown (2 of 2)]
[im 1/72]
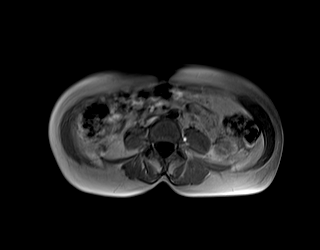
[im 36/72]
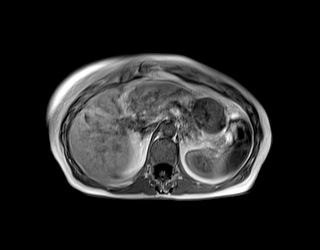
[im 72/72]
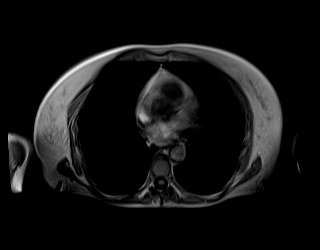

[Series 12: ax haste bh · axial · 6.0mm · 1.19mm/px · 1 of 30 slices shown]
[im 1/30]
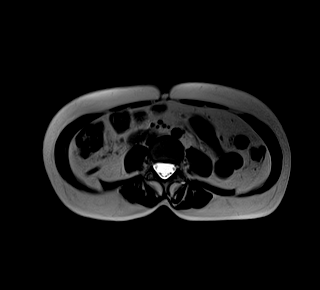

[Series 13: MRCP · coronal · 50.0mm · 0.78mm/px · 1 of 5 slices shown (1 of 3)]
[im 1/5]
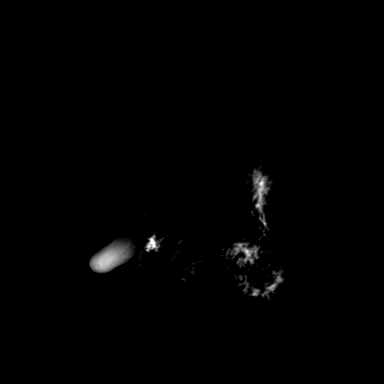

[Series 14: MRCP · coronal · 4.0mm · 1.12mm/px · 1 of 15 slices shown (2 of 3)]
[im 1/15]
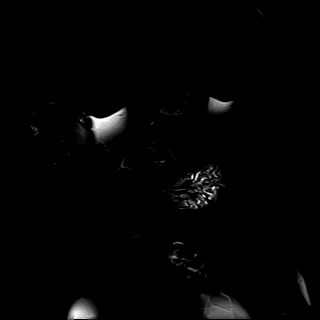

[Series 15: T1 dynamic · axial · 3.0mm · 1.09mm/px · z∈[-101,+87]mm · 3 of 64 slices shown (1 of 7)]
[im 1/64]
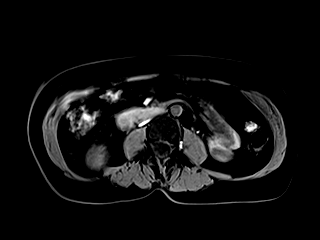
[im 32/64]
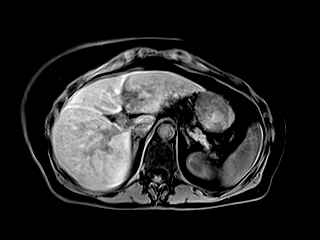
[im 64/64]
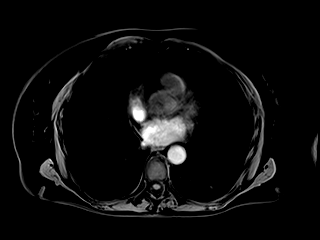

[Series 17: T1 dynamic · axial · 3.0mm · 1.09mm/px · z∈[-101,+87]mm · 3 of 64 slices shown (2 of 7)]
[im 1/64]
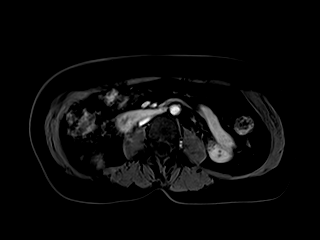
[im 32/64]
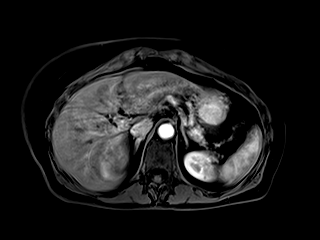
[im 64/64]
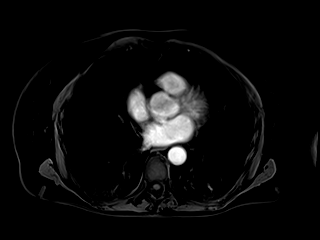

[Series 18: T1 dynamic · axial · 3.0mm · 1.09mm/px · z∈[-101,+87]mm · 3 of 64 slices shown (3 of 7)]
[im 1/64]
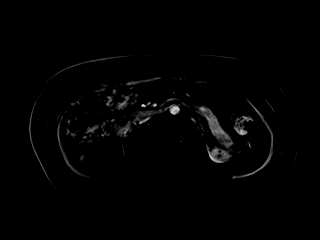
[im 32/64]
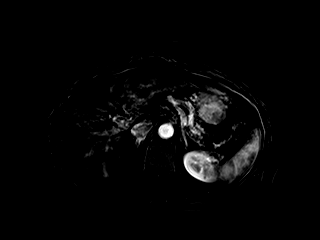
[im 64/64]
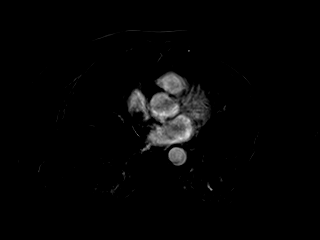

[Series 19: T1 dynamic · axial · 3.0mm · 1.09mm/px · z∈[-101,+87]mm · 3 of 64 slices shown (4 of 7)]
[im 1/64]
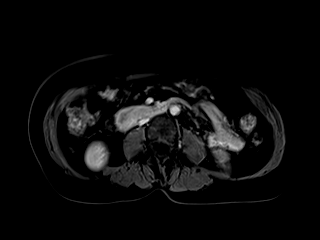
[im 32/64]
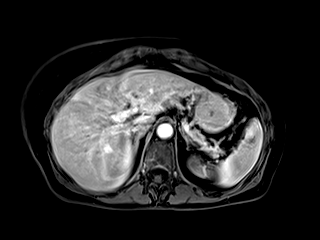
[im 64/64]
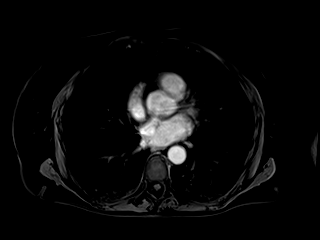

[Series 20: T1 dynamic · axial · 3.0mm · 1.09mm/px · z∈[-101,+87]mm · 3 of 64 slices shown (5 of 7)]
[im 1/64]
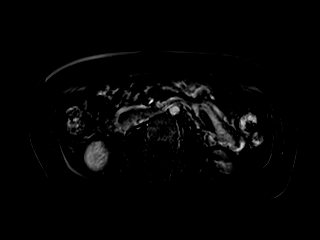
[im 32/64]
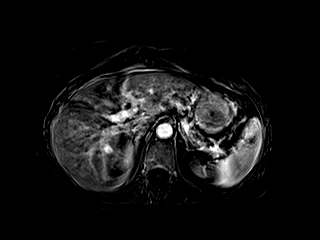
[im 64/64]
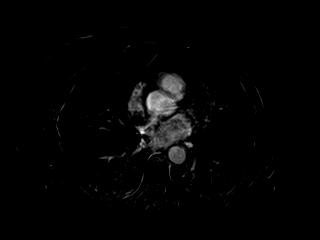

[Series 21: T1 dynamic · axial · 3.0mm · 1.09mm/px · z∈[-101,+87]mm · 3 of 64 slices shown (6 of 7)]
[im 1/64]
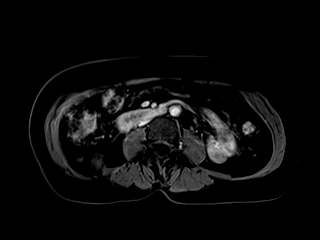
[im 32/64]
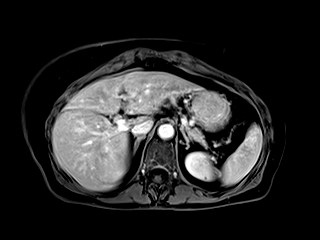
[im 64/64]
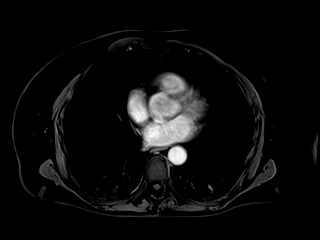

[Series 22: T1 dynamic · axial · 3.0mm · 1.09mm/px · z∈[-101,+87]mm · 3 of 64 slices shown (7 of 7)]
[im 1/64]
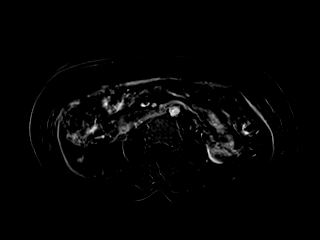
[im 32/64]
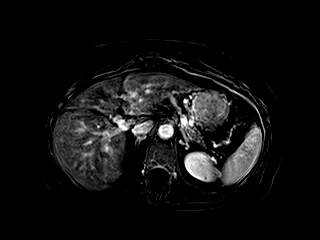
[im 64/64]
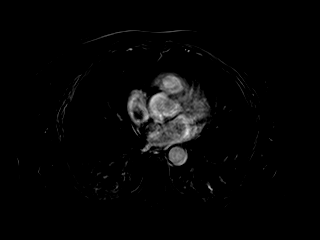

[Series 23: T1 dynamic post-contrast · coronal · 3.0mm · 1.31mm/px · 3 of 72 slices shown (1 of 3)]
[im 1/72]
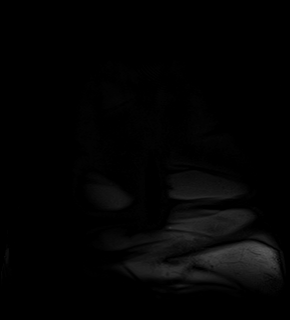
[im 36/72]
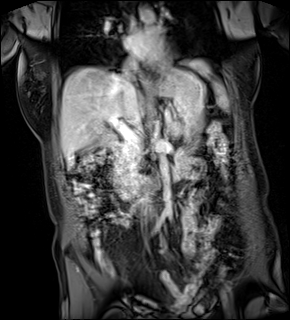
[im 72/72]
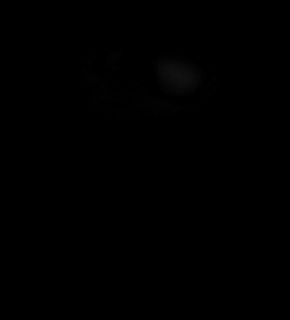

[Series 24: T1 dynamic post-contrast · axial · 3.0mm · 1.09mm/px · z∈[-101,+87]mm · 3 of 64 slices shown (2 of 3)]
[im 1/64]
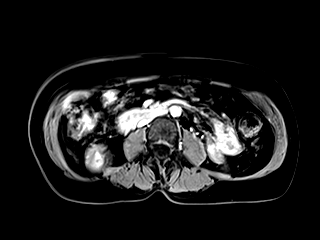
[im 32/64]
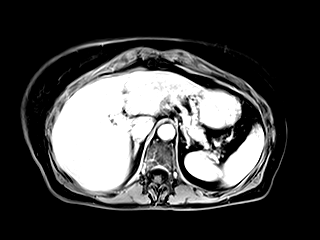
[im 64/64]
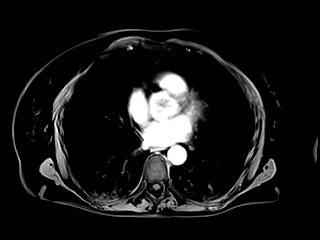

[Series 25: T1 dynamic post-contrast · axial · 3.0mm · 1.09mm/px · z∈[-101,+87]mm · 3 of 64 slices shown (3 of 3)]
[im 1/64]
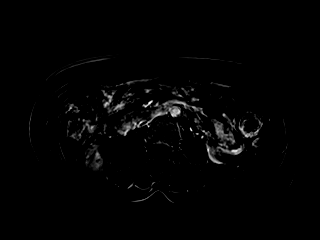
[im 32/64]
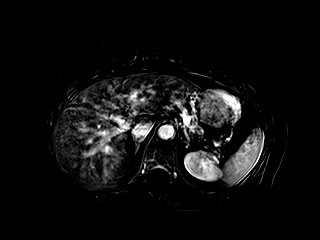
[im 64/64]
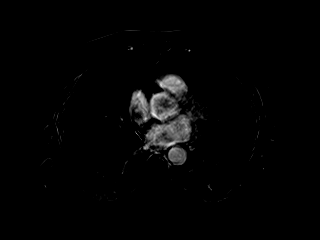

[Series 1028: MRCP · sagittal · 0.6mm · 0.25mm/px · 1 of 14 slices shown (3 of 3)]
[im 1/14]
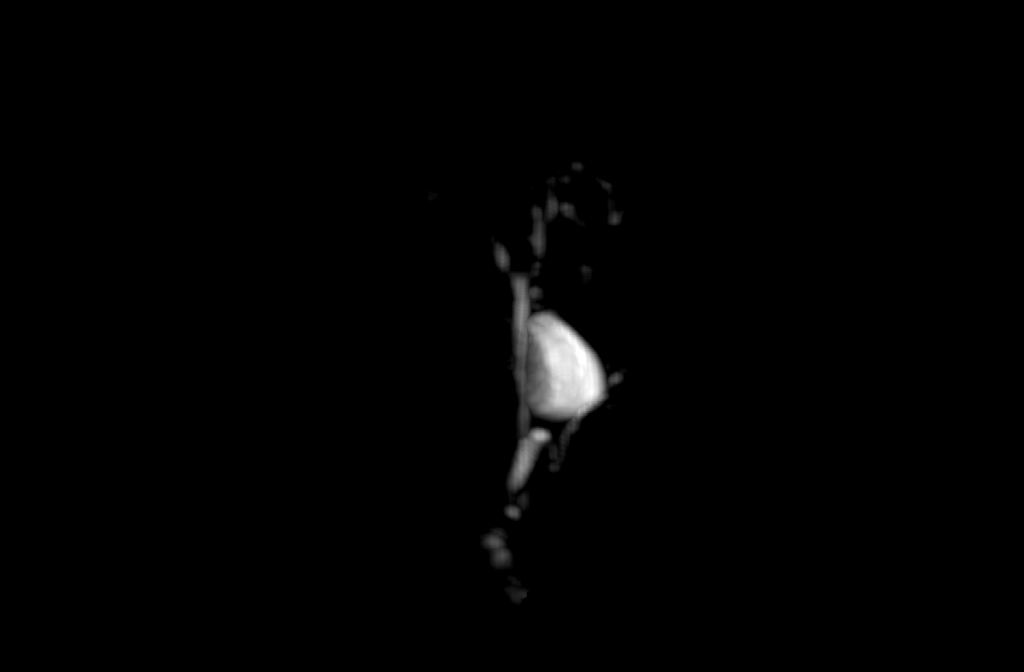

[46 of 48 positions shown; findings below may reference images not displayed]

FINDINGS: The study is limited due to motion.

Lower chest: No acute findings.

Hepatobiliary: Liver is normal in size and contour with no
suspicious mass identified. Appears to be a tiny cyst in the right
hepatic lobe. Gallbladder is normal. No biliary ductal dilatation
identified.

Pancreas:  No suspicious mass or ductal dilatation identified.

Spleen:  Within normal limits in size and appearance.

Adrenals/Urinary Tract: No masses identified. No evidence of
hydronephrosis.

Stomach/Bowel: Visualized portions within the abdomen are
unremarkable.

Vascular/Lymphatic: No pathologically enlarged lymph nodes
identified. No abdominal aortic aneurysm demonstrated.

Other:  No ascites.

Musculoskeletal: No suspicious bone lesions identified.
IMPRESSION: No pancreatic ductal dilatation.  No acute process identified.

## 2021-04-15 MED ORDER — GADOBUTROL 1 MMOL/ML IV SOLN
5.0000 mL | Freq: Once | INTRAVENOUS | Status: AC | PRN
  Administered 2021-04-15: 5 mL via INTRAVENOUS

## 2021-04-23 ENCOUNTER — Other Ambulatory Visit: Payer: Self-pay | Admitting: Nurse Practitioner

## 2021-04-23 DIAGNOSIS — Z8619 Personal history of other infectious and parasitic diseases: Secondary | ICD-10-CM

## 2021-04-29 ENCOUNTER — Ambulatory Visit: Payer: BC Managed Care – PPO | Admitting: Internal Medicine

## 2021-04-29 ENCOUNTER — Encounter: Payer: Self-pay | Admitting: Internal Medicine

## 2021-04-29 VITALS — BP 106/76 | HR 80 | Ht 61.0 in | Wt 98.2 lb

## 2021-04-29 DIAGNOSIS — K582 Mixed irritable bowel syndrome: Secondary | ICD-10-CM | POA: Diagnosis not present

## 2021-04-29 DIAGNOSIS — R1084 Generalized abdominal pain: Secondary | ICD-10-CM | POA: Diagnosis not present

## 2021-04-29 DIAGNOSIS — K5904 Chronic idiopathic constipation: Secondary | ICD-10-CM | POA: Diagnosis not present

## 2021-04-29 NOTE — Patient Instructions (Signed)
If you are age 64 or older, your body mass index should be between 23-30. Your Body mass index is 18.56 kg/m?Marland Kitchen If this is out of the aforementioned range listed, please consider follow up with your Primary Care Provider. ? ?If you are age 81 or younger, your body mass index should be between 19-25. Your Body mass index is 18.56 kg/m?Marland Kitchen If this is out of the aformentioned range listed, please consider follow up with your Primary Care Provider.  ? ?________________________________________________________ ? ?The [place] [facility] providers would like to encourage you to use [facility] to communicate with providers for non-urgent requests or questions.  Due to long hold times on the telephone, sending your provider a message by [facility] may be a faster and more efficient way to get a response.  Please allow 48 business hours for a response.  Please remember that this is for non-urgent requests.  ?_______________________________________________________ ? ?I will refer you to [facility]  ?

## 2021-04-29 NOTE — Progress Notes (Signed)
HISTORY OF PRESENT ILLNESS: ? ?Ruth Terry is a pleasant 66 y.o. female with chronic functional abdominal complaints for which she has been seen in this office on innumerable occasions (6 visits within the past year) as well as corresponded with this office quite frequently regarding her abdominal complaints.  She has had extensive work-up without other revelations.  She has been on multiple therapies with unsatisfactory results.  She was seen as recently as April 15, 2021.  See that dictation.  Highlight of some of her work-up is as follows: ? ?1.  Colonoscopy December 09, 2020.  Mild sigmoid diverticulosis and internal hemorrhoids.  Otherwise normal ?2.  Upper endoscopy December 09, 2020 to evaluate dyspepsia, reflux, and CT scan questioning gastric thickening.  The examination was normal ?3.  CT scan of the abdomen and pelvis March 16, 2021 with contrast.  Unremarkable without abnormalities of the abdomen or pelvis ?4.  Plain films of the abdomen January 02, 2021.  Normal bowel gas pattern. ?5.  MRI/MRCP with and without contrast April 15, 2021.  Normal ?6.  Blood work March 16, 2021 normal comprehensive metabolic panel, normal liver test, normal lipase, normal CBC with hemoglobin 15.2.  Normal thyroid studies November 2022 ?7.  She did report at one point her stools looked oily for which another provider checked a fecal elastase level.  This was low but greater than 100.  Recently placed on pancreatic enzyme.  Has been on for a few weeks but not noticed any changes ? ?THERAPIES: Multiple therapies have been tried for her chronic constipation including but not limited to MiraLAX, Linzess, Trulance, psyllium, simethicone, hyoscyamine, align ? ?Today she reports (a prepared list of questions) the same complaints of constipation, bloating, and rectal fullness which she feels may be hemorrhoids.  She was provided hydrocortisone suppositories previously.  She thinks this helped.  She wonders how often she might use  them.  She asked me to reexamine her rectum today with concerns that "something may have changed".  She also has questions regarding pancreatic insufficiency.  She also asked me to check a bony protuberance of her lower spine which she tells me her PCP said was a pilonidal cyst, despite not being examined.  I look at this before.  It is simply a bony protuberance. ? ?REVIEW OF SYSTEMS: ? ?All non-GI ROS negative unless otherwise stated in the HPI except for sinus and allergy trouble, visual change ? ?Past Medical History:  ?Diagnosis Date  ? Allergy   ? Anal fissure   ? Cataract   ? Chronic constipation   ? Diverticulosis 04/20/2010  ? colonoscopy  ? Gastroparesis   ? GERD (gastroesophageal reflux disease)   ? Hemorrhoids   ? Hyperlipidemia   ? IBS (irritable bowel syndrome)   ? MVP (mitral valve prolapse)   ? Osteoporosis   ? Polyp, stomach 04/20/2010  ? egd  ? Shingles   ? UTI (lower urinary tract infection)   ? ? ?Past Surgical History:  ?Procedure Laterality Date  ? COLONOSCOPY    ? ESOPHAGOGASTRODUODENOSCOPY    ? ? ?Social History ?Ruth Terry  reports that she has quit smoking. Her smoking use included cigarettes. She has a 3.00 pack-year smoking history. She has never used smokeless tobacco. She reports current alcohol use. She reports that she does not use drugs. ? ?family history includes Diabetes in her father; Heart attack in her father; Hypertension in her mother. ? ?Allergies  ?Allergen Reactions  ? Chlordiazepoxide-Clidinium Itching  ? Ciprofloxacin Itching  ?  Irritates stomach  ? Flagyl [Metronidazole]   ?  Severe yeast infection  ? Sulfa Drugs Cross Reactors Nausea Only  ? Cefdinir Nausea And Vomiting  ? Nitrofurantoin Monohyd Macro Rash  ? ? ?  ? ?PHYSICAL EXAMINATION: ?Vital signs: BP 106/76   Pulse 80   Ht _0  (1.549 m)   Wt 98 lb 4 oz (44.6 kg)   SpO2 95%   BMI 18.56 kg/m?   ?Constitutional: Pleasant, generally well-appearing, no acute distress ?Psychiatric: alert and oriented x3,  cooperative ?Eyes: extraocular movements intact, anicteric, conjunctiva pink ?Mouth: oral pharynx moist, no lesions ?Neck: supple no lymphadenopathy ?Cardiovascular: heart regular rate and rhythm, no murmur ?Lungs: clear to auscultation bilaterally ?Abdomen: soft, nontender, nondistended, no obvious ascites, no peritoneal signs, normal bowel sounds, no organomegaly ?Rectal: No external abnormalities.  No internal abnormalities.  No tenderness Julieanne Cotton, [place] chaperone) ?Extremities: no lower extremity edema bilaterally ?Skin: no lesions on visible extremities ?Neuro: No focal deficits.  Cranial nerves intact ? ?ASSESSMENT: ? ?1.  Chronic functional abdominal complaints.  Ongoing.  Extensive negative work-up and unsatisfactory response to a myriad of therapies ?2.  Chronic constipation and bloating.  Ongoing.  Extensive negative work-up and failure to respond to a myriad of therapies ?3.  GERD ?4.  Pancreatic elastase 100-200 range.  Uncertain clinical significance.  Previously provided pancreas enzymes by another provider.  She has not noticed any change in symptoms on supplements for a few weeks. ? ? ?PLAN: ? ?1.  Laxative of choice ?2.  Patient will try pancreatic enzymes for another 6 to 8 weeks to see how she feels.  If no change, stop. ?3.  Referral to [facility] GI division functional disorder section.  Dr. Dorthula Rue with interest and expertise in this area.  I provide that information to the patient.  I have asked my CMA to arrange referral ?4.  GI follow-up in this office as needed ? ?A total time of 40 minutes was spent preparing to see this patient and reviewing a myriad of tests, x-rays, and procedures as outlined above.  Obtaining comprehensive history including addressing myriad of her preprepared questions.  Performing medically appropriate physical examination, counseling and educating the patient regarding the above listed issues.  Directing medical therapy, arranging for tertiary care  referral, and documenting clinical information in the health record ? ? ?  ?

## 2021-05-12 ENCOUNTER — Encounter: Payer: Self-pay | Admitting: Internal Medicine

## 2021-05-12 ENCOUNTER — Ambulatory Visit: Payer: BC Managed Care – PPO | Admitting: Internal Medicine

## 2021-05-12 DIAGNOSIS — Z87891 Personal history of nicotine dependence: Secondary | ICD-10-CM | POA: Diagnosis not present

## 2021-05-12 DIAGNOSIS — J449 Chronic obstructive pulmonary disease, unspecified: Secondary | ICD-10-CM

## 2021-05-12 DIAGNOSIS — F17210 Nicotine dependence, cigarettes, uncomplicated: Secondary | ICD-10-CM | POA: Insufficient documentation

## 2021-05-12 DIAGNOSIS — R079 Chest pain, unspecified: Secondary | ICD-10-CM | POA: Diagnosis not present

## 2021-05-12 MED ORDER — FAMOTIDINE 20 MG PO TABS: ORAL_TABLET | ORAL | Status: AC

## 2021-05-12 NOTE — Assessment & Plan Note (Addendum)
Quit smoking  April 2023  ?- 05/12/2021   Walked on RA  x  3  lap(s) =  approx 450  ft  @ moderate pace, stopped due to end of study  with lowest 02 sats 96% and sob on 2nd lap, some chest discomfort with ex but the reported it was present before exerted as well (this was not the original hx)  ? ?Pt is Group B in terms of symptom/risk and laba/lama therefore appropriate rx at this point >>>  Laba/lama approp but since may also have angina would like to eval this before adding any medications that might increase heart rate or bp and also would like to do pfts first as her hx of doe nor exertional cp are not that straightforward and she has significant GI issues eg gastroparesis that may mimick both other chest syndromes.   ? ?  ?

## 2021-05-12 NOTE — Assessment & Plan Note (Addendum)
Onset p covid Fev 2022  ?- neg cards w/u for cp 2012 ([facility])  ?- Referred to cardiology 05/12/2021 for new pattern cp since covid 02/2020  ? ?In meantime rec d/c bnifa and max rx for gerd as this may be referred from GI tract and on further hx is not directly or proportionally related to ex in pt who states prev w/u indicated MVP ?

## 2021-05-12 NOTE — Patient Instructions (Addendum)
Hold Boniva for now and consider Reclast infusions once a year or Prolia injections twice a year  ? ?Add pepcid 20 mg after supper  ? ?I will be referring you for lung cancer screening ? ?I will be referring for possible angina to the [place] [facility] at [facility] ? ? ?Please schedule a follow up visit in 3 months but call sooner if needed with pfts on return  ? ? ? ? ? ?

## 2021-05-12 NOTE — Progress Notes (Signed)
? ?Ruth Terry, female    DOB: Aug 16, 1955  MRN: [id number] ? ? ?Brief patient profile:  66   yowf  quit smoking  April 2023 self referred to pulmonary clinic in [place]  05/12/2021 for doe since covid Feb 2022   ? ? ?History of Present Illness  ?05/12/2021  Pulmonary/ 1st office eval/ Sherene Sires / [facility]  ?Chief Complaint  ?Patient presents with  ? Consult  ?  Self referral for lung nodule seen on CT scan  ?Patient also states since she had covid she has had chest tightness/pain and sometimes feels she is "suffocating"   ?Dyspnea:  walks 15-20 min some hill some hills over a year and about 25% of the time has chest discomfort that radiates to top of shoulders bilaterally - typically walks after supper  ?Cough: none  ?Sleep: on side/ 2 pillows bed is flat  ?SABA use: no inhalers ?Nasal congestion since covid  ?Has GERD on Boniva  ? ?No obvious day to day or daytime variability or assoc excess/ purulent sputum or mucus plugs or hemoptysis, subjective wheeze or overt sinus or hb symptoms.  ? ?Sleeping as above without nocturnal  or early am exacerbation  of respiratory  c/o's or need for noct saba. Also denies any obvious fluctuation of symptoms with weather or environmental changes or other aggravating or alleviating factors except as outlined above  ? ?No unusual exposure hx or h/o childhood pna/ asthma or knowledge of premature birth. ? ?Current Allergies, Complete Past Medical History, Past Surgical History, Family History, and Social History were reviewed in [employer] record. ? ?ROS  The following are not active complaints unless bolded ?Hoarseness, sore throat, dysphagia, dental problems, itching, sneezing,  nasal congestion or discharge of excess mucus or purulent secretions, ear ache,   fever, chills, sweats, unintended wt loss or wt gain, classically pleuritic  cp,  orthopnea pnd or arm/hand swelling  or leg swelling, presyncope, palpitations, abdominal pain, anorexia, nausea,  vomiting, diarrhea  or change in bowel habits or change in bladder habits, change in stools or change in urine, dysuria, hematuria,  rash, arthralgias, visual complaints, headache, numbness, weakness or ataxia or problems with walking or coordination,  change in mood or  memory. ?      ?   ? ? ? ? ?Past Medical History:  ?Diagnosis Date  ? Allergy   ? Anal fissure   ? Cataract   ? Chronic constipation   ? Diverticulosis 04/20/2010  ? colonoscopy  ? Gastroparesis   ? GERD (gastroesophageal reflux disease)   ? Hemorrhoids   ? Hyperlipidemia   ? IBS (irritable bowel syndrome)   ? MVP (mitral valve prolapse)   ? Osteoporosis   ? Polyp, stomach 04/20/2010  ? egd  ? Shingles   ? UTI (lower urinary tract infection)   ? ? ?Outpatient Medications Prior to Visit  ?Medication Sig Dispense Refill  ? Cholecalciferol (VITAMIN D-3) 25 MCG (1000 UT) CAPS Take 1 capsule by mouth daily.    ? DULoxetine (CYMBALTA) 30 MG capsule Take 1 capsule (30 mg total) by mouth daily. 30 capsule 6  ? hydrocortisone cream 1 % Apply 1 application topically 2 (two) times daily. 30 g 0  ? hyoscyamine (LEVSIN SL) 0.125 MG SL tablet Place 1 tablet (0.125 mg total) under the tongue every 6 (six) hours as needed. 40 tablet 0  ? ibandronate (BONIVA) 150 MG tablet Take 1 tablet (150 mg total) by mouth every 30 (thirty) days. Take  in the morning with a full glass of water, on an empty stomach, and do not take anything else by mouth or lie down for the next 30 min. 3 tablet 0  ? levocetirizine (XYZAL) 5 MG tablet Take 1 tablet (5 mg total) by mouth every evening. For allergies 90 tablet 3  ? linaclotide (LINZESS) 145 MCG CAPS capsule Take 1 capsule (145 mcg total) by mouth daily before breakfast. 30 capsule 11  ? pantoprazole (PROTONIX) 40 MG tablet Take 1 tablet (40 mg total) by mouth daily. 90 tablet 3  ? Probiotic Product (ALIGN) 4 MG CAPS Take 1 capsule by mouth daily.    ? psyllium (METAMUCIL) 58.6 % packet Take 1 packet by mouth daily.    ? Rectal  Protectant-Emollient (CALMOL-4) 76-10 % SUPP Place 1 suppository rectally as needed (for hemmorrhoids). 30 suppository 1  ? Simethicone (PHAZYME MAXIMUM STRENGTH) 250 MG CAPS Take 250 mg by mouth 3 (three) times daily. (Patient taking differently: Take 250 mg by mouth 3 (three) times daily as needed (gas relief).) 14 capsule   ? valACYclovir (VALTREX) 1000 MG tablet TAKE 1 TABLET (1,000 MG TOTAL) BY MOUTH AT BEDTIME. 90 tablet 0  ? Pancrelipase, Lip-Prot-Amyl, (ZENPEP) 25000-79000 units CPEP Take 2 capsules by mouth 3 (three) times daily with meals. (Patient taking differently: Take 1 capsule by mouth 2 (two) times daily with a meal.) 180 capsule 3  ? ?Facility-Administered Medications Prior to Visit  ?Medication Dose Route Frequency Provider Last Rate Last Admin  ? methylPREDNISolone acetate (DEPO-MEDROL) injection 40 mg  40 mg Intramuscular Once Delynn Flavin M, DO      ? ? ? ?Objective:  ?  ? ?BP 122/70 (BP Location: Left Arm, Patient Position: Sitting)   Pulse 92   Temp 97.7 ?F (36.5 ?C) (Temporal)   Ht 5\' 5"  (1.651 m)   Wt 99 lb 3.2 oz (45 kg)   SpO2 98% Comment: ra  BMI 16.51 kg/m?  ? ?SpO2: 98 % (ra) ? ?Wt Readings from Last 3 Encounters:  ?05/12/21 99 lb 3.2 oz (45 kg)  ?04/29/21 98 lb 4 oz (44.6 kg)  ?03/26/21 96 lb 9.6 oz (43.8 kg)  ?  ? ? ?Vital signs reviewed  05/12/2021  - Note at rest 02 sats  98% on RA  ? ?General appearance:    thin somber amb wf nad ? ?HEENT : nl exam   ? ?NECK :  without JVD/Nodes/TM/ nl carotid upstrokes bilaterally ? ? ?LUNGS: no acc muscle use,  Min barrel  contour chest wall with bilateral  slightly decreased bs s audible wheeze and  without cough on insp or exp maneuvers and min  Hyperresonant  to  percussion bilaterally   ? ? ?CV:  RRR  no s3 or murmur or increase in P2, and no edema  ? ?ABD:  soft and nontender with pos end  insp Hoover's  in the supine position. No bruits or organomegaly appreciated, bowel sounds nl ? ?MS:   Nl gait/  ext warm without deformities,  calf tenderness, cyanosis or clubbing ?No obvious joint restrictions  ? ?SKIN: warm and dry without lesions   ? ?NEURO:  alert, approp, nl sensorium with  no motor or cerebellar deficits apparent.  ?    ? ? ?I personally reviewed images and agree with radiology impression as follows:  ? Chest CTchest cuts on ct abd 03/16/21 ?Lower chest: In the image 1 of series 4, there is 3 mm nodular ?density inseparable from major fissure in  the right lower lung ?fields which has not changed significantly. Crowding of interstitial ?markings in the posterior lower lung fields has not changed ?significantly, possibly suggesting scarring. ?   ? ? ?   ?Assessment  ? ? ?COPD GOLD ? / group B ?Quit smoking  April 2023  ?- 05/12/2021   Walked on RA  x  3  lap(s) =  approx 450  ft  @ moderate pace, stopped due to end of study  with lowest 02 sats 96% and sob on 2nd lap, some chest discomfort with ex but the reported it was present before exerted as well (this was not the original hx)  ? ?Pt is Group B in terms of symptom/risk and laba/lama therefore appropriate rx at this point >>>  Laba/lama approp but since may also have angina would like to eval this before adding any medications that might increase heart rate or bp and also would like to do pfts first as her hx of doe nor exertional cp are not that straightforward and she has significant GI issues eg gastroparesis that may mimick both other chest syndromes.   ? ?  ? ?Exertional chest pain ?Onset p covid Fev 2022  ?- neg cards w/u for cp 2012 ([facility])  ?- Referred to cardiology 05/12/2021 for new pattern cp since covid 02/2020  ? ?In meantime rec d/c bnifa and max rx for gerd as this may be referred from GI tract and on further hx is not directly or proportionally related to ex in pt who states prev w/u indicated MVP ? ? ?Former smoker ?Referred for lung cancer screening 05/12/2021 >>> ? ?Low-dose CT lung cancer screening is recommended for patients who ?are 89-51 years of age with a 20+  pack-year history of smoking and ?who are currently smoking or quit <=15 years ago. ?No coughing up blood  ?No unintentional weight loss of > 15 pounds in the last 6 months  ?>>> eligible for next 15 years > refer

## 2021-05-12 NOTE — Assessment & Plan Note (Addendum)
Referred for lung cancer screening 05/12/2021 >>> ? ?Low-dose CT lung cancer screening is recommended for patients who ?are 58-64 years of age with a 20+ pack-year history of smoking and ?who are currently smoking or quit <=15 years ago. ?No coughing up blood  ?No unintentional weight loss of > 15 pounds in the last 6 months  ?>>> eligible for next 15 years > referred for shared decision making  ? ? ?Each maintenance medication was reviewed in detail including emphasizing most importantly the difference between maintenance and prns and under what circumstances the prns are to be triggered using an action plan format where appropriate. ? ?Total time for H and P, chart review, counseling,  directly observing portions of ambulatory 02 saturation study/ and generating customized AVS unique to this office visit / same day charting = 45 min  ?     ?  ?       ?

## 2021-05-22 ENCOUNTER — Telehealth: Payer: Self-pay

## 2021-05-22 NOTE — Telephone Encounter (Signed)
Received fax from [facility] stating that at this time they are unable to provide services in a timely manner and pt should see a physician in their area. Call placed to Dr. Cyd Silence office and explained pt is seen her local area but she needs/wants a tertiary opinion. Per [facility] they are so booked out at this time the referral was denied. Office stated that in a few months we may try to send the referral again and she may or may not be accepted due to their backlog of patients. Dr. Henrene Pastor notified.

## 2021-05-25 NOTE — Telephone Encounter (Signed)
Nothing we can do.  Try again in a few months.  Let the patient know.

## 2021-05-25 NOTE — Telephone Encounter (Signed)
Spoke with pt and she is aware of [facility] denying the referral. Let her know we can try again in a few months. Pt thanked me for the call.

## 2021-05-26 DIAGNOSIS — R300 Dysuria: Secondary | ICD-10-CM | POA: Diagnosis not present

## 2021-05-26 DIAGNOSIS — N390 Urinary tract infection, site not specified: Secondary | ICD-10-CM | POA: Diagnosis not present

## 2021-06-08 DIAGNOSIS — F418 Other specified anxiety disorders: Secondary | ICD-10-CM | POA: Diagnosis not present

## 2021-06-08 DIAGNOSIS — R42 Dizziness and giddiness: Secondary | ICD-10-CM | POA: Diagnosis not present

## 2021-06-08 DIAGNOSIS — R350 Frequency of micturition: Secondary | ICD-10-CM | POA: Diagnosis not present

## 2021-06-08 DIAGNOSIS — M810 Age-related osteoporosis without current pathological fracture: Secondary | ICD-10-CM | POA: Diagnosis not present

## 2021-06-08 DIAGNOSIS — M1990 Unspecified osteoarthritis, unspecified site: Secondary | ICD-10-CM | POA: Diagnosis not present

## 2021-06-16 ENCOUNTER — Other Ambulatory Visit: Payer: Self-pay | Admitting: *Deleted

## 2021-06-16 DIAGNOSIS — Z122 Encounter for screening for malignant neoplasm of respiratory organs: Secondary | ICD-10-CM

## 2021-06-16 DIAGNOSIS — Z87891 Personal history of nicotine dependence: Secondary | ICD-10-CM

## 2021-06-16 NOTE — Progress Notes (Signed)
Cardiology Office Note:    Date:  06/18/2021   ID:  Ruth Terry, DOB [birth year], MRN [id number]  PCP:  Rebekah Chesterfield, NP  Cardiologist:  Lesleigh Noe, MD   Referring MD: Nyoka Cowden, MD   Chief Complaint  Patient presents with   Chest Pain   Hyperlipidemia   Hypertension    History of Present Illness:    Ruth Terry is a 66 y.o. female with a hx of exertional chest pain.Prior h/o MVP, elevated lipids, tobacco use, and family h/o CAD.  Previously saw Dr. Olga Millers.  She is referred by Dr. Demetrios Loll because of exertional chest discomfort that is in the mid clavicular line on both sides of her chest.  It comes on with physical activity and is associated with accentuated dyspnea.  She stops and rests it will ease off.  This is a relatively new complaint, present over the last 6 to 8 weeks.  There is also some discomfort in the jaw and neck.  This is not always associated with chest discomfort.  Past Medical History:  Diagnosis Date   Allergy    Anal fissure    Cataract    Chronic constipation    Diverticulosis 04/20/2010   colonoscopy   Gastroparesis    GERD (gastroesophageal reflux disease)    Hemorrhoids    Hyperlipidemia    IBS (irritable bowel syndrome)    MVP (mitral valve prolapse)    Osteoporosis    Polyp, stomach 04/20/2010   egd   Shingles    UTI (lower urinary tract infection)     Past Surgical History:  Procedure Laterality Date   COLONOSCOPY     ESOPHAGOGASTRODUODENOSCOPY      Current Medications: Current Meds  Medication Sig   Cholecalciferol (VITAMIN D-3) 25 MCG (1000 UT) CAPS Take 1 capsule by mouth daily.   escitalopram (LEXAPRO) 5 MG tablet Take 5 mg by mouth daily.   famotidine (PEPCID) 20 MG tablet One after supper   hydrocortisone cream 1 % Apply 1 application topically 2 (two) times daily.   hyoscyamine (LEVSIN SL) 0.125 MG SL tablet Place 1 tablet (0.125 mg total) under the tongue every 6 (six) hours as needed.    levocetirizine (XYZAL) 5 MG tablet Take 1 tablet (5 mg total) by mouth every evening. For allergies   linaclotide (LINZESS) 145 MCG CAPS capsule Take 1 capsule (145 mcg total) by mouth daily before breakfast.   meloxicam (MOBIC) 15 MG tablet Take 15 mg by mouth daily.   metoprolol tartrate (LOPRESSOR) 50 MG tablet Take 1 tablet (50 mg total) by mouth once for 1 dose. Take 90-120 minutes prior to scan.   pantoprazole (PROTONIX) 40 MG tablet Take 1 tablet (40 mg total) by mouth daily.   Probiotic Product (ALIGN) 4 MG CAPS Take 1 capsule by mouth daily.   psyllium (METAMUCIL) 58.6 % packet Take 1 packet by mouth daily.   Rectal Protectant-Emollient (CALMOL-4) 76-10 % SUPP Place 1 suppository rectally as needed (for hemmorrhoids).   Simethicone (PHAZYME MAXIMUM STRENGTH) 250 MG CAPS Take 250 mg by mouth 3 (three) times daily. (Patient taking differently: Take 250 mg by mouth 3 (three) times daily as needed (gas relief).)   valACYclovir (VALTREX) 1000 MG tablet TAKE 1 TABLET (1,000 MG TOTAL) BY MOUTH AT BEDTIME.   Current Facility-Administered Medications for the 06/17/21 encounter (Office Visit) with Lyn Records, MD  Medication   methylPREDNISolone acetate (DEPO-MEDROL) injection 40 mg     Allergies:  Chlordiazepoxide-clidinium, Ciprofloxacin, Flagyl [metronidazole], Sulfa drugs cross reactors, Cefdinir, and Nitrofurantoin monohyd macro   Social History   Socioeconomic History   Marital status: Married    Spouse name: Not on file   Number of children: 0   Years of education: Not on file   Highest education level: Not on file  Occupational History   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    Employer: [employer]  Tobacco Use   Smoking status: Former    Packs/day: 0.50    Years: 6.00    Total pack years: 3.00    Types: Cigarettes   Smokeless tobacco: Never   Tobacco comments:    USE SMOKELESS CIGARETTES  Vaping Use   Vaping Use: Every day    Substances: Nicotine  Substance and Sexual Activity   Alcohol use: Yes    Alcohol/week: 0.0 standard drinks of alcohol    Comment: 1 a week, wine   Drug use: No   Sexual activity: Not Currently  Other Topics Concern   Not on file  Social History Narrative   Not on file   Social Determinants of Health   Financial Resource Strain: Not on file  Food Insecurity: Not on file  Transportation Needs: Not on file  Physical Activity: Not on file  Stress: Not on file  Social Connections: Not on file     Family History: The patient's family history includes Diabetes in her father; Heart attack in her father; Hypertension in her mother. There is no history of Colon cancer, Esophageal cancer, or Rectal cancer.  ROS:   Please see the history of present illness.    She denies cough.  She is being screened for lung cancer.  She has seen Dr. Sherene Sires because of dyspnea.  No specific evaluation has been done yet but pulmonary testing has been scheduled.  All other systems reviewed and are negative.  EKGs/Labs/Other Studies Reviewed:    The following studies were reviewed today: No concurrent or recent cardiac imaging  EKG:  EKG normal sinus rhythm.  Normal EKG appearance.  Compared to November 2022, significant changes noted.  Recent Labs: 11/13/2020: TSH 1.170 03/16/2021: ALT 12; BUN 8; Creatinine, Ser 0.68; Hemoglobin 15.2; Platelets 211; Potassium 3.5; Sodium 140  Recent Lipid Panel    Component Value Date/Time   CHOL 235 (H) 08/06/2019 0955   TRIG 133 08/06/2019 0955   HDL 72 08/06/2019 0955   CHOLHDL 3.3 08/06/2019 0955   LDLCALC 140 (H) 08/06/2019 0955    Physical Exam:    VS:  BP 108/62   Pulse 78   Ht 5\' 5"  (1.651 m)   Wt 100 lb 3.2 oz (45.5 kg)   SpO2 98%   BMI 16.67 kg/m     Wt Readings from Last 3 Encounters:  06/17/21 100 lb 3.2 oz (45.5 kg)  05/12/21 99 lb 3.2 oz (45 kg)  04/29/21 98 lb 4 oz (44.6 kg)     GEN: Smells of tobacco. No acute distress HEENT:  Normal NECK: No JVD. LYMPHATICS: No lymphadenopathy CARDIAC: No murmur. RRR no gallop, or edema. VASCULAR:  Normal Pulses. No bruits. RESPIRATORY:  Clear to auscultation without rales, wheezing or rhonchi  ABDOMEN: Soft, non-tender, non-distended, No pulsatile mass, MUSCULOSKELETAL: No deformity  SKIN: Warm and dry NEUROLOGIC:  Alert and oriented x 3 PSYCHIATRIC:  Normal affect   ASSESSMENT:    1. Exertional chest pain   2. Aortic atherosclerosis (HCC)   3. COPD GOLD ? /  group B   4. Former smoker   5. Elevated LDL cholesterol level   6. Chest pain, unspecified type   7. Pre-procedure lab exam    PLAN:    In order of problems listed above:  Coronary CT with FFR if indicated. Noted on abdominal and pelvic CT scan. Discomfort could be related to COPD. Significant prior history. LDL elevation without therapy.  Coronary CTA with FFR will be done to help define whether or not there is significant coronary disease in this patient with 3 powerful risk factors including family history, prediabetes (hemoglobin A1c 2020 of 5.5), and untreated hyperlipidemia.   Medication Adjustments/Labs and Tests Ordered: Current medicines are reviewed at length with the patient today.  Concerns regarding medicines are outlined above.  Orders Placed This Encounter  Procedures   CT CORONARY MORPH W/CTA COR W/SCORE W/CA W/CM &/OR WO/CM   Basic metabolic panel   EKG 12-Lead   Meds ordered this encounter  Medications   metoprolol tartrate (LOPRESSOR) 50 MG tablet    Sig: Take 1 tablet (50 mg total) by mouth once for 1 dose. Take 90-120 minutes prior to scan.    Dispense:  1 tablet    Refill:  0    Patient Instructions  Medication Instructions:  Your physician recommends that you continue on your current medications as directed. Please refer to the Current Medication list given to you today.  *If you need a refill on your cardiac medications before your next appointment, please call your  pharmacy*  Lab Work: TODAY: BMET If you have labs (blood work) drawn today and your tests are completely normal, you will receive your results only by: MyChart Message (if you have MyChart) OR A paper copy in the mail If you have any lab test that is abnormal or we need to change your treatment, we will call you to review the results.   Testing/Procedures: Your physician has recommended you have a coronary CTA. You will be called by [facility] scheduler to setup an appointment.  Follow-Up: At [facility], you and your health needs are our priority.  As part of our continuing mission to provide you with exceptional heart care, we have created designated Provider Care Teams.  These Care Teams include your primary Cardiologist (physician) and Advanced Practice Providers (APPs -  Physician Assistants and Nurse Practitioners) who all work together to provide you with the care you need, when you need it.   Your next appointment:   As needed  The format for your next appointment:   In Person  Provider:   Lesleigh Noe, MD {  Other Instructions   Your cardiac CT will be scheduled at the below location:   [facility] [address] [place], [place] [address] [phone]   Please arrive at the [facility] and Children's Entrance (Entrance C2) of [facility] 30 minutes prior to test start time. You can use the FREE valet parking offered at entrance C (encouraged to control the heart rate for the test)  Proceed to the [facility] Department (first floor) to check-in and test prep.  All radiology patients and guests should use entrance C2 at [facility], accessed from [facility], even though the hospital's physical address listed is [address]     Please follow these instructions carefully (unless otherwise directed):  On the Night Before the Test: Be sure to Drink plenty of water. Do not consume any  caffeinated/decaffeinated beverages or  chocolate 12 hours prior to your test. Do not take any antihistamines 12 hours prior to your test.  On the Day of the Test: Drink plenty of water until 1 hour prior to the test. Do not eat any food 4 hours prior to the test. You may take your regular medications prior to the test.  Take metoprolol (Lopressor) 50mg  two hours prior to test. FEMALES- please wear underwire-free bra if available, avoid dresses & tight clothing  After the Test: Drink plenty of water. After receiving IV contrast, you may experience a mild flushed feeling. This is normal. On occasion, you may experience a mild rash up to 24 hours after the test. This is not dangerous. If this occurs, you can take Benadryl 25 mg and increase your fluid intake. If you experience trouble breathing, this can be serious. If it is severe call 911 IMMEDIATELY. If it is mild, please call our office. If you take any of these medications: Glipizide/Metformin, Avandament, Glucavance, please do not take 48 hours after completing test unless otherwise instructed.  We will call to schedule your test 2-4 weeks out understanding that some insurance companies will need an authorization prior to the service being performed.   For non-scheduling related questions, please contact the cardiac imaging nurse navigator should you have any questions/concerns: [place], Cardiac Imaging Nurse Navigator Larey Brick, Cardiac Imaging Nurse Navigator [facility] Direct Office Dial: [phone]   For scheduling needs, including cancellations and rescheduling, please call [place], [phone].   Important Information About Sugar         Signed, Lesleigh Noe, MD  06/18/2021 2:54 PM    [facility]

## 2021-06-17 ENCOUNTER — Ambulatory Visit (INDEPENDENT_AMBULATORY_CARE_PROVIDER_SITE_OTHER): Payer: BC Managed Care – PPO | Admitting: Interventional Cardiology

## 2021-06-17 ENCOUNTER — Encounter: Payer: Self-pay | Admitting: Interventional Cardiology

## 2021-06-17 VITALS — BP 108/62 | HR 78 | Ht 65.0 in | Wt 100.2 lb

## 2021-06-17 DIAGNOSIS — R079 Chest pain, unspecified: Secondary | ICD-10-CM | POA: Diagnosis not present

## 2021-06-17 DIAGNOSIS — I700 Atherosclerosis of aorta: Secondary | ICD-10-CM

## 2021-06-17 DIAGNOSIS — Z01812 Encounter for preprocedural laboratory examination: Secondary | ICD-10-CM

## 2021-06-17 DIAGNOSIS — J449 Chronic obstructive pulmonary disease, unspecified: Secondary | ICD-10-CM | POA: Diagnosis not present

## 2021-06-17 DIAGNOSIS — E7800 Pure hypercholesterolemia, unspecified: Secondary | ICD-10-CM

## 2021-06-17 DIAGNOSIS — Z87891 Personal history of nicotine dependence: Secondary | ICD-10-CM | POA: Diagnosis not present

## 2021-06-17 MED ORDER — METOPROLOL TARTRATE 50 MG PO TABS: 50.0000 mg | ORAL_TABLET | Freq: Once | ORAL | 0 refills | Status: DC

## 2021-06-17 NOTE — Patient Instructions (Signed)
Medication Instructions:  Your physician recommends that you continue on your current medications as directed. Please refer to the Current Medication list given to you today.  *If you need a refill on your cardiac medications before your next appointment, please call your pharmacy*  Lab Work: TODAY: BMET If you have labs (blood work) drawn today and your tests are completely normal, you will receive your results only by: [place] (if you have MyChart) OR A paper copy in the mail If you have any lab test that is abnormal or we need to change your treatment, we will call you to review the results.   Testing/Procedures: Your physician has recommended you have a coronary CTA. You will be called by [facility] scheduler to setup an appointment.  Follow-Up: At [facility], you and your health needs are our priority.  As part of our continuing mission to provide you with exceptional heart care, we have created designated Provider Care Teams.  These Care Teams include your primary Cardiologist (physician) and Advanced Practice Providers (APPs -  Physician Assistants and Nurse Practitioners) who all work together to provide you with the care you need, when you need it.   Your next appointment:   As needed  The format for your next appointment:   In Person  Provider:   Sinclair Grooms, MD {  Other Instructions   Your cardiac CT will be scheduled at the below location:   [facility] [place], [place] [id number] [phone]   Please arrive at the [facility] Entrance (Entrance C2) of [facility] 30 minutes prior to test start time. You can use the FREE valet parking offered at entrance C (encouraged to control the heart rate for the test)  Proceed to the [facility] Radiology Department (first floor) to check-in and test prep.  All radiology patients and guests should use entrance C2 at [facility], accessed from [facility], even though the hospital's physical address listed is [address]     Please follow these instructions carefully (unless otherwise directed):  On the Night Before the Test: Be sure to Drink plenty of water. Do not consume any caffeinated/decaffeinated beverages or chocolate 12 hours prior to your test. Do not take any antihistamines 12 hours prior to your test.  On the Day of the Test: Drink plenty of water until 1 hour prior to the test. Do not eat any food 4 hours prior to the test. You may take your regular medications prior to the test.  Take metoprolol (Lopressor) '50mg'$  two hours prior to test. FEMALES- please wear underwire-free bra if available, avoid dresses & tight clothing  After the Test: Drink plenty of water. After receiving IV contrast, you may experience a mild flushed feeling. This is normal. On occasion, you may experience a mild rash up to 24 hours after the test. This is not dangerous. If this occurs, you can take Benadryl 25 mg and increase your fluid intake. If you experience trouble breathing, this can be serious. If it is severe call 911 IMMEDIATELY. If it is mild, please call our office. If you take any of these medications: Glipizide/Metformin, Avandament, Glucavance, please do not take 48 hours after completing test unless otherwise instructed.  We will call to schedule your test 2-4 weeks out understanding that some insurance companies will need an authorization prior to the service being performed.   For non-scheduling related questions, please contact the cardiac  imaging nurse navigator should you have any questions/concerns: Marchia Bond, Cardiac Imaging Nurse Navigator Gordy Clement, Cardiac Imaging Nurse Navigator [facility] Direct Office Dial: [phone]   For scheduling needs, including cancellations and rescheduling, please call [place], [phone].   Important Information About  [place]

## 2021-06-18 LAB — BASIC METABOLIC PANEL
BUN/Creatinine Ratio: 11 — ABNORMAL LOW (ref 12–28)
BUN: 8 mg/dL (ref 8–27)
CO2: 24 mmol/L (ref 20–29)
Calcium: 9.2 mg/dL (ref 8.7–10.3)
Chloride: 105 mmol/L (ref 96–106)
Creatinine, Ser: 0.73 mg/dL (ref 0.57–1.00)
Glucose: 94 mg/dL (ref 70–99)
Potassium: 3.9 mmol/L (ref 3.5–5.2)
Sodium: 143 mmol/L (ref 134–144)
eGFR: 92 mL/min/{1.73_m2} (ref >59)

## 2021-06-23 ENCOUNTER — Encounter: Payer: Self-pay | Admitting: Acute Care

## 2021-06-23 ENCOUNTER — Ambulatory Visit (INDEPENDENT_AMBULATORY_CARE_PROVIDER_SITE_OTHER): Payer: BC Managed Care – PPO | Admitting: Acute Care

## 2021-06-23 DIAGNOSIS — Z87891 Personal history of nicotine dependence: Secondary | ICD-10-CM

## 2021-06-23 NOTE — Patient Instructions (Signed)
Thank you for participating in the [place] Lung Cancer Screening Program. It was our pleasure to meet you today. We will call you with the results of your scan within the next few days. Your scan will be assigned a Lung RADS category score by the physicians reading the scans.  This Lung RADS score determines follow up scanning.  See below for description of categories, and follow up screening recommendations. We will be in touch to schedule your follow up screening annually or based on recommendations of our providers. We will fax a copy of your scan results to your Primary Care Physician, or the physician who referred you to the program, to ensure they have the results. Please call the office if you have any questions or concerns regarding your scanning experience or results.  Our office number is [phone]. Please speak with Denise Phelps, RN. , or  Denise Buckner RN, They are  our Lung Cancer Screening RN.'s If They are unavailable when you call, Please leave a message on the voice mail. We will return your call at our earliest convenience.This voice mail is monitored several times a day.  Remember, if your scan is normal, we will scan you annually as long as you continue to meet the criteria for the program. (Age 55-77, Current smoker or smoker who has quit within the last 15 years). If you are a smoker, remember, quitting is the single most powerful action that you can take to decrease your risk of lung cancer and other pulmonary, breathing related problems. We know quitting is hard, and we are here to help.  Please let us know if there is anything we can do to help you meet your goal of quitting. If you are a former smoker, congratulations. We are proud of you! Remain smoke free! Remember you can refer friends or family members through the number above.  We will screen them to make sure they meet criteria for the program. Thank you for helping us take better care of you by  participating in Lung Screening.  You can receive free nicotine replacement therapy ( patches, gum or mints) by calling 1-800-QUIT NOW. Please call so we can get you on the path to becoming  a non-smoker. I know it is hard, but you can do this!  Lung RADS Categories:  Lung RADS 1: no nodules or definitely non-concerning nodules.  Recommendation is for a repeat annual scan in 12 months.  Lung RADS 2:  nodules that are non-concerning in appearance and behavior with a very low likelihood of becoming an active cancer. Recommendation is for a repeat annual scan in 12 months.  Lung RADS 3: nodules that are probably non-concerning , includes nodules with a low likelihood of becoming an active cancer.  Recommendation is for a 6-month repeat screening scan. Often noted after an upper respiratory illness. We will be in touch to make sure you have no questions, and to schedule your 6-month scan.  Lung RADS 4 A: nodules with concerning findings, recommendation is most often for a follow up scan in 3 months or additional testing based on our provider's assessment of the scan. We will be in touch to make sure you have no questions and to schedule the recommended 3 month follow up scan.  Lung RADS 4 B:  indicates findings that are concerning. We will be in touch with you to schedule additional diagnostic testing based on our provider's  assessment of the scan.  Other options for assistance in smoking cessation (   As covered by your insurance benefits)  Hypnosis for smoking cessation  [employer] [phone]  Acupuncture for smoking cessation  [facility] [phone]   

## 2021-06-23 NOTE — Progress Notes (Signed)
Virtual Visit via Telephone Note  I connected with Ruth Terry on 06/09/21 at  3:30 PM EDT by telephone and verified that I am speaking with the correct person using two identifiers.  Location: Patient:  At home Provider: [place], [place], [place], [address]    I discussed the limitations, risks, security and privacy concerns of performing an evaluation and management service by telephone and the availability of in person appointments. I also discussed with the patient that there may be a patient responsible charge related to this service. The patient expressed understanding and agreed to proceed.   Shared Decision Making Visit Lung Cancer Screening Program [phone])   Eligibility: Age 66 y.o. Pack Years Smoking History Calculation 20 pack year smoking history (# packs/per year x # years smoked) Recent History of coughing up blood  no Unexplained weight loss? no ( >Than 15 pounds within the last 6 months ) Prior History Lung / other cancer no (Diagnosis within the last 5 years already requiring surveillance chest CT Scans). Smoking Status Former Smoker Former Smokers: Years since quit: 1 year  Quit Date: 05/2020  Visit Components: Discussion included one or more decision making aids. yes Discussion included risk/benefits of screening. yes Discussion included potential follow up diagnostic testing for abnormal scans. yes Discussion included meaning and risk of over diagnosis. yes Discussion included meaning and risk of False Positives. yes Discussion included meaning of total radiation exposure. yes  Counseling Included: Importance of adherence to annual lung cancer LDCT screening. yes Impact of comorbidities on ability to participate in the program. yes Ability and willingness to under diagnostic treatment. yes  Smoking Cessation Counseling: Current Smokers:  Discussed importance of smoking cessation. yes Information about tobacco cessation classes and  interventions provided to patient. yes Patient provided with "ticket" for LDCT Scan. yes Symptomatic Patient. no  Counseling NA Diagnosis Code: Tobacco Use Z72.0 Asymptomatic Patient yes  Counseling (Intermediate counseling: > three minutes counseling) F6433 Former Smokers:  Discussed the importance of maintaining cigarette abstinence. yes Diagnosis Code: Personal History of Nicotine Dependence. I95.188 Information about tobacco cessation classes and interventions provided to patient. Yes Patient provided with "ticket" for LDCT Scan. yes Written Order for Lung Cancer Screening with LDCT placed in Epic. Yes (CT Chest Lung Cancer Screening Low Dose W/O CM) [id number] Z12.2-Screening of respiratory organs Z87.891-Personal history of nicotine dependence  I spent 25 minutes of face to face time/virtual visit time  with  Ms. Hearn discussing the risks and benefits of lung cancer screening. We took the time to pause the power point at intervals to allow for questions to be asked and answered to ensure understanding. We discussed that she had taken the single most powerful action possible to decrease her risk of developing lung cancer when she quit smoking. I counseled her to remain smoke free, and to contact me if she ever had the desire to smoke again so that I can provide resources and tools to help support the effort to remain smoke free. We discussed the time and location of the scan, and that either  Doroteo Glassman RN, Joella Prince, RN or I  or I will call / send a letter with the results within  24-72 hours of receiving them. She has the office contact information in the event she needs to speak with me,  she verbalized understanding of all of the above and had no further questions upon leaving the office.     I explained to the patient that there has  been a high incidence of coronary artery disease noted on these exams. I explained that this is a non-gated exam therefore degree or severity cannot  be determined. This patient is not on statin therapy. I have asked the patient to follow-up with their PCP regarding any incidental finding of coronary artery disease and management with diet or medication as they feel is clinically indicated. The patient verbalized understanding of the above and had no further questions.     Magdalen Spatz, NP 06/23/2021

## 2021-06-25 ENCOUNTER — Ambulatory Visit: Payer: BC Managed Care – PPO

## 2021-06-30 ENCOUNTER — Ambulatory Visit (INDEPENDENT_AMBULATORY_CARE_PROVIDER_SITE_OTHER): Payer: BC Managed Care – PPO | Admitting: Urology

## 2021-06-30 ENCOUNTER — Encounter: Payer: Self-pay | Admitting: Urology

## 2021-06-30 VITALS — BP 112/67 | HR 91

## 2021-06-30 DIAGNOSIS — R339 Retention of urine, unspecified: Secondary | ICD-10-CM

## 2021-06-30 DIAGNOSIS — R350 Frequency of micturition: Secondary | ICD-10-CM | POA: Diagnosis not present

## 2021-06-30 DIAGNOSIS — R3915 Urgency of urination: Secondary | ICD-10-CM

## 2021-06-30 DIAGNOSIS — R3914 Feeling of incomplete bladder emptying: Secondary | ICD-10-CM

## 2021-06-30 DIAGNOSIS — N390 Urinary tract infection, site not specified: Secondary | ICD-10-CM

## 2021-06-30 MED ORDER — ALFUZOSIN HCL ER 10 MG PO TB24: 10.0000 mg | ORAL_TABLET | Freq: Every day | ORAL | 11 refills | Status: DC

## 2021-07-01 ENCOUNTER — Ambulatory Visit (HOSPITAL_COMMUNITY)
Admission: RE | Admit: 2021-07-01 | Discharge: 2021-07-01 | Disposition: A | Discharge status: Home / Self Care | Payer: BC Managed Care – PPO | Source: Ambulatory Visit | Attending: Acute Care | Admitting: Acute Care

## 2021-07-01 DIAGNOSIS — Z87891 Personal history of nicotine dependence: Secondary | ICD-10-CM | POA: Diagnosis not present

## 2021-07-01 DIAGNOSIS — Z122 Encounter for screening for malignant neoplasm of respiratory organs: Secondary | ICD-10-CM | POA: Insufficient documentation

## 2021-07-01 LAB — URINALYSIS, ROUTINE W REFLEX MICROSCOPIC
Bilirubin, UA: NEGATIVE
Glucose, UA: NEGATIVE
Ketones, UA: NEGATIVE
Leukocytes,UA: NEGATIVE
Nitrite, UA: NEGATIVE
Protein,UA: NEGATIVE
RBC, UA: NEGATIVE
Specific Gravity, UA: <1.005 — ABNORMAL LOW (ref 1.005–1.030)
Urobilinogen, Ur: 0.2 mg/dL (ref 0.2–1.0)
pH, UA: 6 (ref 5.0–7.5)

## 2021-07-03 ENCOUNTER — Telehealth: Payer: Self-pay | Admitting: Acute Care

## 2021-07-03 DIAGNOSIS — R350 Frequency of micturition: Secondary | ICD-10-CM | POA: Diagnosis not present

## 2021-07-03 DIAGNOSIS — R911 Solitary pulmonary nodule: Secondary | ICD-10-CM

## 2021-07-03 DIAGNOSIS — F418 Other specified anxiety disorders: Secondary | ICD-10-CM | POA: Diagnosis not present

## 2021-07-03 DIAGNOSIS — Z87891 Personal history of nicotine dependence: Secondary | ICD-10-CM

## 2021-07-03 NOTE — Telephone Encounter (Signed)
Results of CT faxed to PCP with plan notes.  Order placed for CT chest LCS nodule follow up 6 months

## 2021-07-03 NOTE — Telephone Encounter (Signed)
I have called the patient with the results of her low-dose screening CT.  I explained that her scan was read as a lung RADS 3, probably benign findings that need a short-term follow-up low-dose CT in 6 months. I explained that her scan showed a subpleural nodular opacity at the right lung apex that is potentially focal scarring however needs reevaluation for stability in 6 months.  The nodule of concern is 7.4 mm in size. I also explained that there was notation of moderate left anterior descending and right coronary artery calcifications.  Patient is being followed by a cardiologist and has several diagnostics scheduled. There was also notation of emphysema which she plans to discuss with Dr. Melvyn Novas at her next office visit. Denise please schedule 17-monthlow-dose CT follow-up Please fax results to PCP and let them know plan is for a 62-monthow-dose CT follow-up Thanks so much

## 2021-07-09 ENCOUNTER — Emergency Department (HOSPITAL_COMMUNITY): Payer: BC Managed Care – PPO

## 2021-07-09 ENCOUNTER — Encounter (HOSPITAL_COMMUNITY): Payer: Self-pay

## 2021-07-09 ENCOUNTER — Other Ambulatory Visit: Payer: Self-pay

## 2021-07-09 ENCOUNTER — Emergency Department (HOSPITAL_COMMUNITY)
Admission: EM | Admit: 2021-07-09 | Discharge: 2021-07-09 | Disposition: A | Discharge status: Home / Self Care | Payer: BC Managed Care – PPO | Attending: Emergency Medicine | Admitting: Emergency Medicine

## 2021-07-09 DIAGNOSIS — R110 Nausea: Secondary | ICD-10-CM | POA: Insufficient documentation

## 2021-07-09 DIAGNOSIS — R079 Chest pain, unspecified: Secondary | ICD-10-CM | POA: Diagnosis not present

## 2021-07-09 DIAGNOSIS — E876 Hypokalemia: Secondary | ICD-10-CM | POA: Insufficient documentation

## 2021-07-09 DIAGNOSIS — R9431 Abnormal electrocardiogram [ECG] [EKG]: Secondary | ICD-10-CM | POA: Diagnosis not present

## 2021-07-09 DIAGNOSIS — K5900 Constipation, unspecified: Secondary | ICD-10-CM | POA: Insufficient documentation

## 2021-07-09 DIAGNOSIS — R1013 Epigastric pain: Secondary | ICD-10-CM | POA: Diagnosis not present

## 2021-07-09 DIAGNOSIS — R1084 Generalized abdominal pain: Secondary | ICD-10-CM | POA: Diagnosis not present

## 2021-07-09 DIAGNOSIS — J439 Emphysema, unspecified: Secondary | ICD-10-CM | POA: Diagnosis not present

## 2021-07-09 DIAGNOSIS — I700 Atherosclerosis of aorta: Secondary | ICD-10-CM | POA: Diagnosis not present

## 2021-07-09 DIAGNOSIS — R109 Unspecified abdominal pain: Secondary | ICD-10-CM | POA: Diagnosis not present

## 2021-07-09 LAB — LIPASE, BLOOD: Lipase: 44 U/L (ref 11–51)

## 2021-07-09 LAB — BASIC METABOLIC PANEL
Anion gap: 10 (ref 5–15)
BUN: 6 mg/dL — ABNORMAL LOW (ref 8–23)
CO2: 28 mmol/L (ref 22–32)
Calcium: 9.4 mg/dL (ref 8.9–10.3)
Chloride: 104 mmol/L (ref 98–111)
Creatinine, Ser: 0.63 mg/dL (ref 0.44–1.00)
GFR, Estimated: >60 mL/min (ref >60)
Glucose, Bld: 86 mg/dL (ref 70–99)
Potassium: 3.2 mmol/L — ABNORMAL LOW (ref 3.5–5.1)
Sodium: 142 mmol/L (ref 135–145)

## 2021-07-09 LAB — URINALYSIS, ROUTINE W REFLEX MICROSCOPIC
Bilirubin Urine: NEGATIVE
Glucose, UA: NEGATIVE mg/dL
Hgb urine dipstick: NEGATIVE
Ketones, ur: NEGATIVE mg/dL
Leukocytes,Ua: NEGATIVE
Nitrite: NEGATIVE
Protein, ur: NEGATIVE mg/dL
Specific Gravity, Urine: 1.003 — ABNORMAL LOW (ref 1.005–1.030)
pH: 7 (ref 5.0–8.0)

## 2021-07-09 LAB — CBC
HCT: 42.3 % (ref 36.0–46.0)
Hemoglobin: 14 g/dL (ref 12.0–15.0)
MCH: 29.5 pg (ref 26.0–34.0)
MCHC: 33.1 g/dL (ref 30.0–36.0)
MCV: 89.1 fL (ref 80.0–100.0)
Platelets: 201 10*3/uL (ref 150–400)
RBC: 4.75 MIL/uL (ref 3.87–5.11)
RDW: 12.9 % (ref 11.5–15.5)
WBC: 5.7 10*3/uL (ref 4.0–10.5)
nRBC: 0 % (ref 0.0–0.2)

## 2021-07-09 LAB — TROPONIN I (HIGH SENSITIVITY)
Troponin I (High Sensitivity): <2 ng/L (ref <18)
Troponin I (High Sensitivity): <2 ng/L (ref <18)

## 2021-07-09 LAB — HEPATIC FUNCTION PANEL
ALT: 12 U/L (ref 0–44)
AST: 14 U/L — ABNORMAL LOW (ref 15–41)
Albumin: 4.2 g/dL (ref 3.5–5.0)
Alkaline Phosphatase: 73 U/L (ref 38–126)
Bilirubin, Direct: 0.1 mg/dL (ref 0.0–0.2)
Indirect Bilirubin: 0.5 mg/dL (ref 0.3–0.9)
Total Bilirubin: 0.6 mg/dL (ref 0.3–1.2)
Total Protein: 7.5 g/dL (ref 6.5–8.1)

## 2021-07-09 MED ORDER — HYDROCODONE-ACETAMINOPHEN 5-325 MG PO TABS: ORAL_TABLET | ORAL | 0 refills | Status: DC

## 2021-07-09 MED ORDER — ONDANSETRON HCL 4 MG PO TABS: 4.0000 mg | ORAL_TABLET | Freq: Four times a day (QID) | ORAL | 0 refills | Status: DC

## 2021-07-09 MED ORDER — IOHEXOL 9 MG/ML PO SOLN
ORAL | Status: AC
  Filled 2021-07-09: qty 1000

## 2021-07-09 MED ORDER — HYDROCODONE-ACETAMINOPHEN 5-325 MG PO TABS: 2.0000 | ORAL_TABLET | ORAL | 0 refills | Status: DC | PRN

## 2021-07-09 MED ORDER — IOHEXOL 300 MG/ML  SOLN
75.0000 mL | Freq: Once | INTRAMUSCULAR | Status: AC | PRN
  Administered 2021-07-09: 75 mL via INTRAVENOUS

## 2021-07-09 MED ORDER — ONDANSETRON HCL 4 MG PO TABS: 4.0000 mg | ORAL_TABLET | Freq: Three times a day (TID) | ORAL | 0 refills | Status: DC | PRN

## 2021-07-09 NOTE — ED Notes (Signed)
Pt returned from radiology.

## 2021-07-09 NOTE — Discharge Instructions (Signed)
As discussed, your CT of your abdomen this evening shows a possible gallbladder issue.  You have been scheduled to return here tomorrow for an outpatient ultrasound of your gallbladder.  Please call the scheduling department at [phone] tomorrow morning to schedule a time for the ultrasound.  Also, call Dr. Arnoldo Morale office to arrange a follow-up appointment for next week.  Return to the emergency department if you develop any new or worsening symptoms such as fever, vomiting or increasing abdominal pain.

## 2021-07-09 NOTE — ED Triage Notes (Signed)
Pt presents with abd pain that radiates to her back. Pt also having chest pain. Pt reports associated nausea and severe constipation. Pt saw her PCP last week for the same. Pt was placed on carafate and Xifaxan.

## 2021-07-09 NOTE — ED Notes (Signed)
Pt ambulated to bathroom with steady gait. 

## 2021-07-09 NOTE — ED Provider Notes (Signed)
[facility] EMERGENCY DEPARTMENT Provider Note   CSN: [id number] Arrival date & time: 07/09/21  1217     History  Chief Complaint  Patient presents with   Abdominal Pain    CLYTEE HEINRICH is a 66 y.o. female.   Abdominal Pain Associated symptoms: chest pain, constipation and nausea   Associated symptoms: no chills, no fever, no shortness of breath and no vomiting         NIKOLA BLACKSTON is a 66 y.o. female with past medical history of chronic constipation, IBS, GERD, diverticulosis, and gastroparesis who presents to the Emergency Department complaining of diffuse abdominal pain and chest pain.  Symptoms have been associated with nausea and constipation.  Present x1 week.  She was seen by PCP last week for same started on Carafate and Xifaxan without relief.  She describes sharp abdominal pain that radiates to her back.  No vomiting black or bloody stools.  She took over-the-counter Metamucil with some relief of constipation.  Had "good" bowel movement yesterday.  States she feels some fullness of her upper abdomen that radiates into her chest.  She is scheduled to see cardiology next week as scheduled for coronary CT and CT of the chest for evaluation of nodule.  Had MRCP in April without acute pancreatic findings.  She notes vague history of EPI.   Home Medications Prior to Admission medications   Medication Sig Start Date End Date Taking? Authorizing Provider  alfuzosin (UROXATRAL) 10 MG 24 hr tablet Take 1 tablet (10 mg total) by mouth daily with breakfast. 06/30/21   Franchot Gallo, MD  Cholecalciferol (VITAMIN D-3) 25 MCG (1000 UT) CAPS Take 1 capsule by mouth daily.    [provider]  escitalopram (LEXAPRO) 5 MG tablet Take 5 mg by mouth daily.    [provider]  famotidine (PEPCID) 20 MG tablet One after supper 05/12/21   Tanda Rockers, MD  hydrocortisone cream 1 % Apply 1 application topically 2 (two) times daily. 03/27/20   Gwenlyn Perking, FNP   hyoscyamine (LEVSIN SL) 0.125 MG SL tablet Place 1 tablet (0.125 mg total) under the tongue every 6 (six) hours as needed. 09/25/20   Esterwood, [name] S, PA-C  levocetirizine (XYZAL) 5 MG tablet Take 1 tablet (5 mg total) by mouth every evening. For allergies Patient not taking: Reported on 06/30/2021 08/22/20   [name], DO  linaclotide [facility]) 145 MCG CAPS capsule Take 1 capsule (145 mcg total) by mouth daily before breakfast. 03/26/21   Esterwood, [name] S, PA-C  meloxicam (MOBIC) 15 MG tablet Take 15 mg by mouth daily. 06/09/21   [provider]  metoprolol tartrate (LOPRESSOR) 50 MG tablet Take 1 tablet (50 mg total) by mouth once for 1 dose. Take 90-120 minutes prior to scan. 06/17/21 06/17/21  Belva Crome, MD  Pancrelipase, Lip-Prot-Amyl, (ZENPEP) 25000-79000 units CPEP Take 2 capsules by mouth 3 (three) times daily with meals. Patient taking differently: Take 1 capsule by mouth 2 (two) times daily with a meal. 07/31/20 01/14/21  Esterwood, [name] S, PA-C  pantoprazole (PROTONIX) 40 MG tablet Take 1 tablet (40 mg total) by mouth daily. 01/22/21   Irene Shipper, MD  Probiotic Product (ALIGN) 4 MG CAPS Take 1 capsule by mouth daily.    [provider]  psyllium (METAMUCIL) 58.6 % packet Take 1 packet by mouth daily.    [provider]  Rectal Protectant-Emollient (CALMOL-4) 76-10 % SUPP Place 1 suppository rectally as needed (for hemmorrhoids).  03/27/20   Gwenlyn Perking, FNP  Simethicone (PHAZYME MAXIMUM STRENGTH) 250 MG CAPS Take 250 mg by mouth 3 (three) times daily. Patient taking differently: Take 250 mg by mouth 3 (three) times daily as needed (gas relief). 02/20/16   Terald Sleeper, PA-C  valACYclovir (VALTREX) 1000 MG tablet TAKE 1 TABLET (1,000 MG TOTAL) BY MOUTH AT BEDTIME. 01/26/21   Hassell Done, Mary-Margaret, FNP  AMBULATORY NON FORMULARY MEDICATION Medication Name: Domperidone 10 mg Take 1 tablet at bedtime daily. Patient taking differently: Take 1 tablet by  mouth at bedtime. Medication Name: Domperidone 10 mg Take 1 tablet at bedtime daily. 09/08/16 05/15/19  Esterwood, [name] S, PA-C      Allergies    Chlordiazepoxide-clidinium, Ciprofloxacin, Flagyl [metronidazole], Sulfa drugs cross reactors, Cefdinir, and Nitrofurantoin monohyd macro    Review of Systems   Review of Systems  Constitutional:  Positive for appetite change. Negative for chills and fever.  Respiratory:  Negative for shortness of breath.   Cardiovascular:  Positive for chest pain.  Gastrointestinal:  Positive for abdominal pain, constipation and nausea. Negative for abdominal distention and vomiting.  Genitourinary:  Positive for difficulty urinating.  Musculoskeletal:  Positive for back pain.  Skin:  Negative for color change and wound.  Neurological:  Negative for weakness and numbness.  Psychiatric/Behavioral:  Negative for confusion.     Physical Exam Updated Vital Signs BP 120/84 (BP Location: Right Arm)   Pulse 96   Temp 99.1 F (37.3 C) (Oral)   Resp 17   Ht 5' (1.524 m)   Wt 46.3 kg   SpO2 99%   BMI 19.92 kg/m  Physical Exam Vitals and nursing note reviewed.  Constitutional:      General: She is not in acute distress.    Appearance: Normal appearance. She is well-developed. She is not ill-appearing.  Cardiovascular:     Rate and Rhythm: Normal rate and regular rhythm.     Pulses: Normal pulses.  Pulmonary:     Effort: Pulmonary effort is normal.     Breath sounds: Normal breath sounds.  Chest:     Chest wall: No tenderness.  Abdominal:     General: There is no distension.     Palpations: Abdomen is soft. There is no mass.     Tenderness: There is abdominal tenderness. There is no right CVA tenderness, left CVA tenderness or guarding.     Comments: Mild epigastric tenderness without guarding or rebound.  No CVA tenderness.  Musculoskeletal:     Right lower leg: No edema.     Left lower leg: No edema.  Skin:    General: Skin is warm.     Capillary  Refill: Capillary refill takes less than 2 seconds.  Neurological:     General: No focal deficit present.     Mental Status: She is alert.     Sensory: No sensory deficit.     Motor: No weakness.     ED Results / Procedures / Treatments   Labs (all labs ordered are listed, but only abnormal results are displayed) Labs Reviewed  BASIC METABOLIC PANEL - Abnormal; Notable for the following components:      Result Value   Potassium 3.2 (*)    BUN 6 (*)    All other components within normal limits  HEPATIC FUNCTION PANEL - Abnormal; Notable for the following components:   AST 14 (*)    All other components within normal limits  URINALYSIS, ROUTINE W REFLEX MICROSCOPIC - Abnormal; Notable  for the following components:   Color, Urine COLORLESS (*)    Specific Gravity, Urine 1.003 (*)    All other components within normal limits  CBC  LIPASE, BLOOD  TROPONIN I (HIGH SENSITIVITY)  TROPONIN I (HIGH SENSITIVITY)    EKG None  Radiology CT ABDOMEN PELVIS W CONTRAST  Result Date: 07/09/2021 CLINICAL DATA:  Acute abdominal pain.  Nausea.  Constipation. EXAM: CT ABDOMEN AND PELVIS WITH CONTRAST TECHNIQUE: Multidetector CT imaging of the abdomen and pelvis was performed using the standard protocol following bolus administration of intravenous contrast. RADIATION DOSE REDUCTION: This exam was performed according to the departmental dose-optimization program which includes automated exposure control, adjustment of the mA and/or kV according to patient size and/or use of iterative reconstruction technique. CONTRAST:  51m OMNIPAQUE IOHEXOL 300 MG/ML  SOLN COMPARISON:  CT 03/16/2021 FINDINGS: Lower chest: Bandlike atelectasis as well as hypoventilatory changes in the lung bases. No pleural fluid. Hepatobiliary: Liver is normal in size without focal hepatic abnormality. There is gallbladder wall thickening on probable hyperemia. Mild pericholecystic fat stranding with trace pericholecystic free fluid.  No calcified gallstone. Normal common bile duct. No intrahepatic biliary ductal dilatation. Pancreas: No ductal dilatation or inflammation. Spleen: Normal in size without focal abnormality. Adrenals/Urinary Tract: Normal adrenal glands. No hydronephrosis or perinephric edema. Homogeneous renal enhancement with symmetric excretion on delayed phase imaging. No renal calculi or focal lesion. Urinary bladder is physiologically distended without wall thickening. Stomach/Bowel: Unremarkable appearance of the stomach. Duodenal diverticulum without inflammation. Normal small bowel without obstruction or inflammation. Enteric contrast fills the appendix, no appendicitis. Overall small to moderate colonic stool burden. No pericolonic edema or focal abnormality. Vascular/Lymphatic: Moderate aortic atherosclerosis. No aortic aneurysm. Patent portal, splenic, and mesenteric veins. No adenopathy. Reproductive: Normal quiescent uterus.  No adnexal mass. Other: No free air. No abdominopelvic collection. Tiny fat containing umbilical hernia. Musculoskeletal: There are no acute or suspicious osseous abnormalities. IMPRESSION: 1. Findings suspicious for acute cholecystitis. Recommend right upper quadrant ultrasound for further evaluation. 2. Small to moderate colonic stool burden. Aortic Atherosclerosis (ICD10-I70.0). Electronically Signed   By: MKeith RakeM.D.   On: 07/09/2021 17:16   DG Chest 2 View  Result Date: 07/09/2021 CLINICAL DATA:  Chest pain. EXAM: CHEST - 2 VIEW COMPARISON:  CT chest dated July 01, 2021. FINDINGS: The heart size and mediastinal contours are within normal limits. Normal pulmonary vascularity. The lungs remain hyperinflated with emphysematous changes and coarse interstitial markings. Unchanged mild biapical pleuroparenchymal scarring. No focal consolidation, pleural effusion, or pneumothorax. No acute osseous abnormality. IMPRESSION: 1. No acute cardiopulmonary disease. 2. COPD. Electronically  Signed   By: WTitus DubinM.D.   On: 07/09/2021 13:19    Procedures Procedures    Medications Ordered in ED Medications - No data to display  ED Course/ Medical Decision Making/ A&P                           Medical Decision Making Patient here with upper abdominal pain.  History of chronic constipation abdominal pain worsening x1 week.  Pains been associated with nausea and some back and chest pain.  On exam, patient nontoxic-appearing.  Vital signs are reassuring.  Low-grade fever 99.1 no active vomiting and mucous membranes are moist.  Differential would include recurrence of chronic abdominal pain, worsening constipation, SBO, gallbladder disease.  Amount and/or Complexity of Data Reviewed Labs: ordered.    Details: Labs interpreted by me, no evidence of leukocyte ptosis, hemoglobin  reassuring.  Chemistries show mild hypokalemia with potassium of 3.2.  Transaminases unremarkable.  Troponin reassuring.  Lipase unremarkable urinalysis without evidence of infection Radiology: ordered.    Details: Chest x-ray without acute findings, CT abdomen and pelvis show findings suspicious for acute cholecystitis right upper quadrant ultrasound recommended for further evaluation.  Small to moderate stool burden ECG/medicine tests: ordered.    Details: EKG shows normal sinus rhythm Discussion of management or test interpretation with external provider(s): Discussed findings with general surgery, Dr. Arnoldo Morale.  Patient prefers to follow-up in his office and schedule cholecystectomy on outpatient basis.  I will order outpatient gallbladder ultrasound for tomorrow.  Will give strict ER return precautions as well.  Patient verbalized understanding agreeable to plan  Risk Prescription drug management.           Final Clinical Impression(s) / ED Diagnoses Final diagnoses:  Epigastric pain  Constipation, unspecified constipation type    Rx / DC Orders ED Discharge Orders     None          Kem Parkinson, PA-C 07/09/21 1840    Milton Ferguson, MD 07/10/21 1708

## 2021-07-09 NOTE — ED Notes (Signed)
Pt provided with prepake zofran and vicodin. Pt verbalized understanding of how to take medication and signed prescription.

## 2021-07-09 NOTE — ED Notes (Signed)
Dc instructions and scripts reviewed with pt. Pt provided and signed for prepacked meds for tonight and understands to pick up prescriptions tomorrow when pharmacy opens. Pt will call office tomorrow to schedule a time for ultrasound. Pt declined wheelchair and ambulated out of ED with friend at her side.

## 2021-07-10 ENCOUNTER — Ambulatory Visit (HOSPITAL_COMMUNITY)
Admission: RE | Admit: 2021-07-10 | Discharge: 2021-07-10 | Disposition: A | Discharge status: Home / Self Care | Payer: BC Managed Care – PPO | Source: Ambulatory Visit | Attending: Emergency Medicine | Admitting: Emergency Medicine

## 2021-07-10 ENCOUNTER — Telehealth: Payer: Self-pay | Admitting: Internal Medicine

## 2021-07-10 DIAGNOSIS — R110 Nausea: Secondary | ICD-10-CM | POA: Insufficient documentation

## 2021-07-10 DIAGNOSIS — K810 Acute cholecystitis: Secondary | ICD-10-CM | POA: Diagnosis not present

## 2021-07-10 DIAGNOSIS — R1011 Right upper quadrant pain: Secondary | ICD-10-CM | POA: Diagnosis not present

## 2021-07-10 DIAGNOSIS — K828 Other specified diseases of gallbladder: Secondary | ICD-10-CM | POA: Diagnosis not present

## 2021-07-10 MED FILL — Hydrocodone-Acetaminophen Tab 5-325 MG: ORAL | Qty: 6 | Status: AC

## 2021-07-10 MED FILL — Ondansetron HCl Tab 4 MG: ORAL | Qty: 4 | Status: AC

## 2021-07-10 NOTE — Telephone Encounter (Signed)
Pt was seen in the ER last night for abd pain going around to her back, CT scan was done and pt states she may have and issue with her gallbladder. She is having an [place] today. Discussed with her that she could wait until the [place] results come back and if gallbladder needs to come out Dr. Henrene Pastor could refer her to [facility]. Pt states she really wants to come in a discuss her gallbladder issues along with IBS/Constipation. Pt scheduled to see Dr. Henrene Pastor 07/15/21 at 4pm, pt aware of appt.

## 2021-07-10 NOTE — Telephone Encounter (Signed)
Inbound call from patient stating that she went to the hospital yesterday and she stated she is having issues with her gallbladder. Patient is requesting a call back to discuss. Please advise.

## 2021-07-13 DIAGNOSIS — J020 Streptococcal pharyngitis: Secondary | ICD-10-CM | POA: Diagnosis not present

## 2021-07-14 ENCOUNTER — Telehealth (HOSPITAL_COMMUNITY): Payer: Self-pay | Admitting: *Deleted

## 2021-07-14 MED ORDER — IVABRADINE HCL 5 MG PO TABS: ORAL_TABLET | ORAL | 0 refills | Status: DC

## 2021-07-14 NOTE — Telephone Encounter (Signed)
Reaching out to patient to offer assistance regarding upcoming cardiac imaging study; pt verbalizes understanding of appt date/time, parking situation and where to check in, pre-test NPO status and medications ordered, and verified current allergies; name and call back number provided for further questions should they arise  Gordy Clement RN Navigator Cardiac [place] [facility] [phone] office [phone] cell  Patient reports her HR in the 90's with well controlled BP.  Patient to take '15mg'$  ivabradine two hours prior to her cardiac CT scan.  She is aware to arrive at 12:30pm.

## 2021-07-15 ENCOUNTER — Encounter: Payer: Self-pay | Admitting: Internal Medicine

## 2021-07-15 ENCOUNTER — Ambulatory Visit: Payer: BC Managed Care – PPO | Admitting: Internal Medicine

## 2021-07-15 ENCOUNTER — Ambulatory Visit (HOSPITAL_COMMUNITY)
Admission: RE | Admit: 2021-07-15 | Discharge: 2021-07-15 | Disposition: A | Discharge status: Home / Self Care | Payer: BC Managed Care – PPO | Source: Ambulatory Visit | Attending: Interventional Cardiology | Admitting: Interventional Cardiology

## 2021-07-15 VITALS — BP 108/60 | HR 68 | Ht 60.0 in | Wt 100.4 lb

## 2021-07-15 DIAGNOSIS — K219 Gastro-esophageal reflux disease without esophagitis: Secondary | ICD-10-CM | POA: Diagnosis not present

## 2021-07-15 DIAGNOSIS — K810 Acute cholecystitis: Secondary | ICD-10-CM | POA: Diagnosis not present

## 2021-07-15 DIAGNOSIS — K5904 Chronic idiopathic constipation: Secondary | ICD-10-CM

## 2021-07-15 DIAGNOSIS — K5901 Slow transit constipation: Secondary | ICD-10-CM | POA: Diagnosis not present

## 2021-07-15 DIAGNOSIS — R079 Chest pain, unspecified: Secondary | ICD-10-CM | POA: Insufficient documentation

## 2021-07-15 DIAGNOSIS — E119 Type 2 diabetes mellitus without complications: Secondary | ICD-10-CM | POA: Diagnosis not present

## 2021-07-15 DIAGNOSIS — K8020 Calculus of gallbladder without cholecystitis without obstruction: Secondary | ICD-10-CM

## 2021-07-15 DIAGNOSIS — I2510 Atherosclerotic heart disease of native coronary artery without angina pectoris: Secondary | ICD-10-CM | POA: Diagnosis not present

## 2021-07-15 MED ORDER — NITROGLYCERIN 0.4 MG SL SUBL
SUBLINGUAL_TABLET | SUBLINGUAL | Status: AC
  Filled 2021-07-15: qty 2

## 2021-07-15 MED ORDER — NITROGLYCERIN 0.4 MG SL SUBL
0.8000 mg | SUBLINGUAL_TABLET | Freq: Once | SUBLINGUAL | Status: AC
  Administered 2021-07-15: 0.8 mg via SUBLINGUAL

## 2021-07-15 MED ORDER — IOHEXOL 350 MG/ML SOLN
75.0000 mL | Freq: Once | INTRAVENOUS | Status: AC | PRN
  Administered 2021-07-15: 75 mL via INTRAVENOUS

## 2021-07-15 NOTE — Progress Notes (Signed)
HISTORY OF PRESENT ILLNESS:  Ruth Terry is a 66 y.o. female with chronic functional abdominal complaints for which she is seen in this office frequently.  Last seen April 29, 2021.  See that dictation.  She presents today for a new problem--symptomatic gallstones.  Patient states she was in her usual state of health until July 5 when she developed moderately severe epigastric pain.  This subsequently moved to the right upper quadrant with radiation into the right subscapular region.  He progressed to the point that she presented herself to the local emergency room on July 09, 2021.  I have reviewed that encounter.  I have reviewed blood work and imaging.  CT scan of the abdomen and pelvis that day revealed gallbladder wall thickening with pericholecystic fat stranding and trace pericholecystic fluid.  Subsequent right upper quadrant ultrasound revealed gallbladder wall thickening with echogenic foci consistent with stones or sludge.  Positive sonographic Murphy sign.  No ductal dilation.  Liver tests and lipase were normal as was blood cell count of 5.7.  She was treated with Vicodin and Zofran.  Her pain resolved within 48 hours.  No recurrence.  She has multiple conditions regarding her problem, diet, as well as management of her chronic constipation.  REVIEW OF SYSTEMS:  All non-GI ROS negative unless otherwise stated in the HPI except for dizziness  Past Medical History:  Diagnosis Date   Allergy    Anal fissure    Cataract    Chronic constipation    Diverticulosis 04/20/2010   colonoscopy   Gastroparesis    GERD (gastroesophageal reflux disease)    Hemorrhoids    Hyperlipidemia    IBS (irritable bowel syndrome)    MVP (mitral valve prolapse)    Osteoporosis    Polyp, stomach 04/20/2010   egd   Shingles    UTI (lower urinary tract infection)     Past Surgical History:  Procedure Laterality Date   COLONOSCOPY     ESOPHAGOGASTRODUODENOSCOPY      Social History Ruth Terry  reports that she quit smoking about 14 months ago. Her smoking use included cigarettes. She has a 20.00 pack-year smoking history. She has never used smokeless tobacco. She reports that she does not currently use alcohol. She reports that she does not use drugs.  family history includes Diabetes in her father; Heart attack in her father; Hypertension in her mother.  Allergies  Allergen Reactions   Chlordiazepoxide-Clidinium Itching   Ciprofloxacin Itching    Irritates stomach   Flagyl [Metronidazole]     Severe yeast infection   Sulfa Drugs Cross Reactors Nausea Only   Cefdinir Nausea And Vomiting   Nitrofurantoin Monohyd Macro Rash       PHYSICAL EXAMINATION: Vital signs: BP 108/60   Pulse 68   Ht 5' (1.524 m)   Wt 100 lb 6 oz (45.5 kg)   BMI 19.60 kg/m   Constitutional: generally well-appearing, no acute distress Psychiatric: alert and oriented x3, cooperative Eyes: extraocular movements intact, anicteric, conjunctiva pink Mouth: oral pharynx moist, no lesions Neck: supple no lymphadenopathy Cardiovascular: heart regular rate and rhythm, no murmur Lungs: clear to auscultation bilaterally Abdomen: soft, nontender, nondistended, no obvious ascites, no peritoneal signs, normal bowel sounds, no organomegaly Rectal: Omitted Extremities: no clubbing, cyanosis, or lower extremity edema bilaterally Skin: no lesions on visible extremities Neuro: No focal deficits.  Cranial nerves intact  ASSESSMENT:  1.  Acute calculus cholecystitis.  Resolved. 2.  Chronic functional abdominal complaints 3.  GERD  4.  Diabetes 5.  Functional constipation   PLAN:  1.  General surgical referral for laparoscopic cholecystectomy 2.  Reflux precautions 3.  Continue PPI 4.  Laxative of choice 5.  GI follow-up as needed A total time of 30 minutes was spent.  To the patient, reviewing outside x-rays, laboratories, and evaluation reports.  Obtaining comprehensive interval history and  performing medically appropriate physical examination.  Counseling and educating the patient regarding her above listed issues.  Making surgical referral.  Documenting clinical information in the health record

## 2021-07-15 NOTE — Patient Instructions (Signed)
If you are age 64 or older, your body mass index should be between 23-30. Your Body mass index is 19.6 kg/m. If this is out of the aforementioned range listed, please consider follow up with your Primary Care Provider.  If you are age 55 or younger, your body mass index should be between 19-25. Your Body mass index is 19.6 kg/m. If this is out of the aformentioned range listed, please consider follow up with your Primary Care Provider.   ________________________________________________________  The [facility] providers would like to encourage you to use [facility] to communicate with providers for non-urgent requests or questions.  Due to long hold times on the telephone, sending your provider a message by [facility] may be a faster and more efficient way to get a response.  Please allow 48 business hours for a response.  Please remember that this is for non-urgent requests.  _______________________________________________________  [facility] will call you to schedule an appointment

## 2021-07-16 ENCOUNTER — Telehealth: Payer: Self-pay | Admitting: Interventional Cardiology

## 2021-07-16 NOTE — Telephone Encounter (Signed)
Ruth Crome, MD  07/15/2021  6:02 PM EDT     Let the patient know study has suspicious area for blockage. Needs left heart cath with coronary angiography and possible PCI next week. She will need updated H and P by APP. A copy will be sent to Stacie Glaze, DO    The patient has been notified of the result and verbalized understanding.  All questions (if any) were answered. Antonieta Iba, RN 07/16/2021 4:14 PM   No availability with an APP. Patient has been scheduled to see DOD tomorrow 7/14 to be set up for heart cath.

## 2021-07-16 NOTE — Telephone Encounter (Signed)
Patient is calling for results to her CT scan.

## 2021-07-17 ENCOUNTER — Encounter: Payer: Self-pay | Admitting: Cardiovascular Disease

## 2021-07-17 ENCOUNTER — Ambulatory Visit: Payer: BC Managed Care – PPO | Admitting: Cardiovascular Disease

## 2021-07-17 VITALS — BP 110/60 | HR 73 | Ht 60.0 in | Wt 100.4 lb

## 2021-07-17 DIAGNOSIS — I200 Unstable angina: Secondary | ICD-10-CM | POA: Diagnosis not present

## 2021-07-17 MED ORDER — NITROGLYCERIN 0.4 MG SL SUBL: 0.4000 mg | SUBLINGUAL_TABLET | SUBLINGUAL | 3 refills | Status: AC | PRN

## 2021-07-17 MED ORDER — ROSUVASTATIN CALCIUM 10 MG PO TABS: 10.0000 mg | ORAL_TABLET | Freq: Every day | ORAL | 3 refills | Status: DC

## 2021-07-17 MED ORDER — ROSUVASTATIN CALCIUM 5 MG PO TABS: 5.0000 mg | ORAL_TABLET | Freq: Every day | ORAL | 3 refills | Status: DC

## 2021-07-17 MED ORDER — ASPIRIN 81 MG PO TBEC: 81.0000 mg | DELAYED_RELEASE_TABLET | Freq: Every day | ORAL | 3 refills | Status: AC

## 2021-07-17 NOTE — H&P (View-Only) (Signed)
Chief Complaint  Patient presents with   Follow-up    CAD, unstable angina   History of Present Illness: 66 yo female with history of mitral valve prolapse, hyperlipidemia, tobacco abuse, chronic constipation, IBS, GERD, diverticulosis, gastroparesis and HTN who is added onto my schedule today for the evaluation of chest pain. She has been followed in our office by Dr. Tamala Julian. She was seen in the office by Dr. Tamala Julian on 06/17/21 and had c/o chest pain and dyspnea with exertion. Coronary CTA 07/15/21 with calcium score of 186. Severe mid RCA stenosis. Mild plaque in the LAD and Circumflex. She was seen in the ED 07/09/21 with epigastric pain and nausea. CT abdomen suspicious for acute cholecystitis.   She tells me today that she has had several episodes of chest pain and dyspnea over the past few weejs. She continues to have chest pain and dyspnea with exertion. She denies palpitations, lower extremity edema, orthopnea, PND, dizziness, near syncope or syncope.    Primary Care Physician: Adaline Sill, NP  Past Medical History:  Diagnosis Date   Allergy    Anal fissure    Cataract    Chronic constipation    Diverticulosis 04/20/2010   colonoscopy   Gastroparesis    GERD (gastroesophageal reflux disease)    Hemorrhoids    Hyperlipidemia    IBS (irritable bowel syndrome)    MVP (mitral valve prolapse)    Osteoporosis    Polyp, stomach 04/20/2010   egd   Shingles    UTI (lower urinary tract infection)     Past Surgical History:  Procedure Laterality Date   COLONOSCOPY     ESOPHAGOGASTRODUODENOSCOPY      Current Outpatient Medications  Medication Sig Dispense Refill   alfuzosin (UROXATRAL) 10 MG 24 hr tablet Take 1 tablet (10 mg total) by mouth daily with breakfast. 30 tablet 11   aspirin EC 81 MG tablet Take 1 tablet (81 mg total) by mouth daily. Swallow whole. 90 tablet 3   Cholecalciferol (VITAMIN D-3) 25 MCG (1000 UT) CAPS Take 1 capsule by mouth daily.     escitalopram  (LEXAPRO) 5 MG tablet Take 5 mg by mouth daily.     famotidine (PEPCID) 20 MG tablet One after supper     HYDROcodone-acetaminophen (NORCO/VICODIN) 5-325 MG tablet Take 2 tablets by mouth every 4 (four) hours as needed. 6 tablet 0   HYDROcodone-acetaminophen (NORCO/VICODIN) 5-325 MG tablet Take one tab po q 4 hrs prn pain 6 tablet 0   hydrocortisone cream 1 % Apply 1 application topically 2 (two) times daily. 30 g 0   hyoscyamine (LEVSIN SL) 0.125 MG SL tablet Place 1 tablet (0.125 mg total) under the tongue every 6 (six) hours as needed. 40 tablet 0   levocetirizine (XYZAL) 5 MG tablet Take 1 tablet (5 mg total) by mouth every evening. For allergies 90 tablet 3   linaclotide (LINZESS) 145 MCG CAPS capsule Take 1 capsule (145 mcg total) by mouth daily before breakfast. 30 capsule 11   meloxicam (MOBIC) 15 MG tablet Take 15 mg by mouth daily.     nitroGLYCERIN (NITROSTAT) 0.4 MG SL tablet Place 1 tablet (0.4 mg total) under the tongue every 5 (five) minutes as needed for chest pain. 25 tablet 3   ondansetron (ZOFRAN) 4 MG tablet Take 1 tablet (4 mg total) by mouth every 8 (eight) hours as needed for nausea or vomiting. 4 tablet 0   ondansetron (ZOFRAN) 4 MG tablet Take 1 tablet (4 mg total)  by mouth every 6 (six) hours. 8 tablet 0   pantoprazole (PROTONIX) 40 MG tablet Take 1 tablet (40 mg total) by mouth daily. 90 tablet 3   Probiotic Product (ALIGN) 4 MG CAPS Take 1 capsule by mouth daily.     psyllium (METAMUCIL) 58.6 % packet Take 1 packet by mouth daily.     Rectal Protectant-Emollient (CALMOL-4) 76-10 % SUPP Place 1 suppository rectally as needed (for hemmorrhoids). 30 suppository 1   rosuvastatin (CRESTOR) 10 MG tablet Take 1 tablet (10 mg total) by mouth daily. 90 tablet 3   Simethicone (PHAZYME MAXIMUM STRENGTH) 250 MG CAPS Take 250 mg by mouth 3 (three) times daily. (Patient taking differently: Take 250 mg by mouth 3 (three) times daily as needed (gas relief).) 14 capsule    valACYclovir  (VALTREX) 1000 MG tablet TAKE 1 TABLET (1,000 MG TOTAL) BY MOUTH AT BEDTIME. 90 tablet 0   Pancrelipase, Lip-Prot-Amyl, (ZENPEP) 25000-79000 units CPEP Take 2 capsules by mouth 3 (three) times daily with meals. (Patient taking differently: Take 1 capsule by mouth 2 (two) times daily with a meal.) 180 capsule 3   Current Facility-Administered Medications  Medication Dose Route Frequency Provider Last Rate Last Admin   methylPREDNISolone acetate (DEPO-MEDROL) injection 40 mg  40 mg Intramuscular Once Gottschalk, Ashly M, DO        Allergies  Allergen Reactions   Chlordiazepoxide-Clidinium Itching   Ciprofloxacin Itching    Irritates stomach   Flagyl [Metronidazole]     Severe yeast infection   Sulfa Drugs Cross Reactors Nausea Only   Cefdinir Nausea And Vomiting   Nitrofurantoin Monohyd Macro Rash    Social History   Socioeconomic History   Marital status: Married    Spouse name: Not on file   Number of children: 0   Years of education: Not on file   Highest education level: Not on file  Occupational History   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    Employer: [employer]  Tobacco Use   Smoking status: Former    Packs/day: 1.00    Years: 20.00    Total pack years: 20.00    Types: Cigarettes    Quit date: 05/2020    Years since quitting: 1.2   Smokeless tobacco: Never   Tobacco comments:    USE SMOKELESS CIGARETTES  Vaping Use   Vaping Use: Some days   Substances: Nicotine  Substance and Sexual Activity   Alcohol use: Not Currently    Comment: 1 a week, wine   Drug use: No   Sexual activity: Not Currently  Other Topics Concern   Not on file  Social History Narrative   Not on file   Social Determinants of Health   Financial Resource Strain: Not on file  Food Insecurity: Not on file  Transportation Needs: Not on file  Physical Activity: Not on file  Stress: Not on file  Social Connections: Not on file  Intimate Partner  Violence: Not on file    Family History  Problem Relation Age of Onset   Hypertension Mother    Diabetes Father    Heart attack Father        Age 24   Colon cancer Neg Hx    Esophageal cancer Neg Hx    Rectal cancer Neg Hx     Review of Systems:  As stated in the HPI and otherwise negative.   BP 110/60   Pulse 73   Ht 5' (1.524  m)   Wt 100 lb 6.4 oz (45.5 kg)   SpO2 98%   BMI 19.61 kg/m   Physical Examination: General: Well developed, well nourished, NAD  HEENT: OP clear, mucus membranes moist  SKIN: warm, dry. No rashes. Neuro: No focal deficits  Musculoskeletal: Muscle strength 5/5 all ext  Psychiatric: Mood and affect normal  Neck: No JVD, no carotid bruits, no thyromegaly, no lymphadenopathy.  Lungs:Clear bilaterally, no wheezes, rhonci, crackles Cardiovascular: Regular rate and rhythm. No murmurs, gallops or rubs. Abdomen:Soft. Bowel sounds present. Non-tender.  Extremities: No lower extremity edema. Pulses are 2 + in the bilateral DP/PT.  EKG:  EKG is not ordered today. The ekg ordered today demonstrates   Coronary CTA 07/15/21: Aorta: Normal size.  Aortic atherosclerosis.  No dissection.   Aortic Valve:  Trileaflet.  No calcifications.   Coronary Arteries:  Normal coronary origin.  Right dominance.   RCA is a large dominant artery that gives rise to PDA and PLVB. There is severe (>70%) mixed plaque in the mid RCA.   Left main is a large artery that gives rise to LAD and LCX arteries.   LAD is a large vessel that has Minimal (1-24%) mixed plaque at the ostium and mild (25-49%) mixed plaque in the mid LAD.   LCX is a non-dominant artery with minimal (<25%) calcified plaque. The LCX gives rise to two OM branches without plaque.   Coronary Calcium Score:   Left main: 0   Left anterior descending artery: 36   Left circumflex artery: 0.61   Right coronary artery: 150   Total: 186   Percentile: 89th   Other findings:   Normal pulmonary vein  drainage into the left atrium.   Normal let atrial appendage without a thrombus.   Normal size of the pulmonary artery.   Study limited by slab reconstruction artifact.   IMPRESSION: 1. Coronary calcium score of 186. This was 89th percentile for age-, race-, and sex-matched controls.   2. Normal coronary origin with right dominance.   3. There appears to be severe (>70%) mixed plaque in the mid RCA. This is just proximal to a region of slab reconstruction artifact. Unable to send for FFRct due to artifact. Recommend functional imaging or coronary angiography to futher investigate. Otherwise non-obstructive CAD. CAD- RADS 4.   4.  Aortic atherosclerosis.  Recent Labs: 11/13/2020: TSH 1.170 07/09/2021: ALT 12; BUN 6; Creatinine, Ser 0.63; Hemoglobin 14.0; Platelets 201; Potassium 3.2; Sodium 142   Lipid Panel    Component Value Date/Time   CHOL 235 (H) 08/06/2019 0955   TRIG 133 08/06/2019 0955   HDL 72 08/06/2019 0955   CHOLHDL 3.3 08/06/2019 0955   LDLCALC 140 (H) 08/06/2019 0955     Wt Readings from Last 3 Encounters:  07/17/21 100 lb 6.4 oz (45.5 kg)  07/15/21 100 lb 6 oz (45.5 kg)  07/09/21 102 lb (46.3 kg)     Assessment and Plan:   1. CAD with unstable angina: Pt with recent chest pain concerning for unstable angina. Coronary CTA with severe mid RCA stenosis. Cardiac cath is indicated.  I have reviewed the risks, indications, and alternatives to cardiac catheterization, possible angioplasty, and stenting with the patient. Risks include but are not limited to bleeding, infection, vascular injury, stroke, myocardial infection, arrhythmia, kidney injury, radiation-related injury in the case of prolonged fluoroscopy use, emergency cardiac surgery, and death. The patient understands the risks of serious complication is 1-2 in 3086 with diagnostic cardiac cath and 1-2% or  less with angioplasty/stenting.   -Will plan cardiac cath at [facility] on 07/22/21 at 11:30 am with Dr.  Tamala Julian -Pre-cath labs today.  -Start ASA 81 mg daily.  -Start Crestor 10 mg daily -Will given her a prescription for SL NTG  Labs/ tests ordered today include:   Orders Placed This Encounter  Procedures   Basic Metabolic Panel (BMET)     Disposition:   F/U with Dr. Tamala Julian or office APP 2-3 weeks post cath.    Signed, Lauree Chandler, MD 07/17/2021 2:34 PM    [place], [place], [place]  [address] Phone: [phone]; Fax: [phone]

## 2021-07-17 NOTE — Patient Instructions (Signed)
Medication Instructions:  Your physician has recommended you make the following change in your medication:  1.) start aspirin 81 mg - one tablet daily 2.) start rosuvastatin 10 mg - one tablet daily 3.) start nitroglycerin (sublingual) -     If a single episode of chest pain is not relieved by one tablet, the patient will try another within 5 minutes; and if this doesn't relieve the pain, the patient will try another within 5 minutes and if this doesn't relieve the pain the patient is instructed to call 911 for transportation to an emergency department.   *If you need a refill on your cardiac medications before your next appointment, please call your pharmacy*   Lab Work: Today: BMET  Testing/Procedures: Your physician has requested that you have a cardiac catheterization. Cardiac catheterization is used to diagnose and/or treat various heart conditions. Doctors may recommend this procedure for a number of different reasons. The most common reason is to evaluate chest pain. Chest pain can be a symptom of coronary artery disease (CAD), and cardiac catheterization can show whether plaque is narrowing or blocking your heart's arteries. This procedure is also used to evaluate the valves, as well as measure the blood flow and oxygen levels in different parts of your heart. For further information please visit [email or web address]. Please follow instruction sheet, as given.   Follow-Up: At [facility], you and your health needs are our priority.  As part of our continuing mission to provide you with exceptional heart care, we have created designated Provider Care Teams.  These Care Teams include your primary Cardiologist (physician) and Advanced Practice Providers (APPs -  Physician Assistants and Nurse Practitioners) who all work together to provide you with the care you need, when you need it.  Your next appointment:   3 week(s)  The format for your next appointment:   In Person  Provider:    Sinclair Grooms, MD  or Advanced Practice Provider    Other [place] OFFICE [place], [place] [address] Dept: [phone] Loc: [place]  07/17/2021  You are scheduled for a Cardiac Catheterization on Wednesday, July 19 with Dr. Daneen Schick.  1. Please arrive at the Main Entrance A at [facility]: [place], [place] [address] at 9:30 AM (This time is two hours before your procedure to ensure your preparation). Free valet parking service is available.   Special note: Every effort is made to have your procedure done on time. Please understand that emergencies sometimes delay scheduled procedures.  2. Diet: Do not eat solid foods after midnight.  You may have clear liquids until 5 AM upon the day of the procedure.  3. Labs: You will need to have blood drawn today.  (BMET)  You do not need to be fasting.  4. Medication instructions in preparation for your procedure:   Contrast Allergy: No  On the morning of your procedure, take Aspirin 81 mg and any morning medicines NOT listed above.  You may use sips of water.  5. Plan to go home the same day, you will only stay overnight if medically necessary. 6. You MUST have a responsible adult to drive you home. 7. An adult MUST be with you the first 24 hours after you arrive home. 8. Bring a current list of your medications, and the last time and date medication taken. 9. Bring ID and  current insurance cards. 10.Please wear clothes that are easy to get on and off and wear slip-on shoes.  Thank you for allowing [place] to care for you!   -- [facility]

## 2021-07-17 NOTE — Progress Notes (Signed)
Chief Complaint  Patient presents with   Follow-up    CAD, unstable angina   History of Present Illness: 66 yo female with history of mitral valve prolapse, hyperlipidemia, tobacco abuse, chronic constipation, IBS, GERD, diverticulosis, gastroparesis and HTN who is added onto my schedule today for the evaluation of chest pain. She has been followed in our office by Dr. Tamala Julian. She was seen in the office by Dr. Tamala Julian on 06/17/21 and had c/o chest pain and dyspnea with exertion. Coronary CTA 07/15/21 with calcium score of 186. Severe mid RCA stenosis. Mild plaque in the LAD and Circumflex. She was seen in the ED 07/09/21 with epigastric pain and nausea. CT abdomen suspicious for acute cholecystitis.   She tells me today that she has had several episodes of chest pain and dyspnea over the past few weejs. She continues to have chest pain and dyspnea with exertion. She denies palpitations, lower extremity edema, orthopnea, PND, dizziness, near syncope or syncope.    Primary Care Physician: Adaline Sill, NP  Past Medical History:  Diagnosis Date   Allergy    Anal fissure    Cataract    Chronic constipation    Diverticulosis 04/20/2010   colonoscopy   Gastroparesis    GERD (gastroesophageal reflux disease)    Hemorrhoids    Hyperlipidemia    IBS (irritable bowel syndrome)    MVP (mitral valve prolapse)    Osteoporosis    Polyp, stomach 04/20/2010   egd   Shingles    UTI (lower urinary tract infection)     Past Surgical History:  Procedure Laterality Date   COLONOSCOPY     ESOPHAGOGASTRODUODENOSCOPY      Current Outpatient Medications  Medication Sig Dispense Refill   alfuzosin (UROXATRAL) 10 MG 24 hr tablet Take 1 tablet (10 mg total) by mouth daily with breakfast. 30 tablet 11   aspirin EC 81 MG tablet Take 1 tablet (81 mg total) by mouth daily. Swallow whole. 90 tablet 3   Cholecalciferol (VITAMIN D-3) 25 MCG (1000 UT) CAPS Take 1 capsule by mouth daily.     escitalopram  (LEXAPRO) 5 MG tablet Take 5 mg by mouth daily.     famotidine (PEPCID) 20 MG tablet One after supper     HYDROcodone-acetaminophen (NORCO/VICODIN) 5-325 MG tablet Take 2 tablets by mouth every 4 (four) hours as needed. 6 tablet 0   HYDROcodone-acetaminophen (NORCO/VICODIN) 5-325 MG tablet Take one tab po q 4 hrs prn pain 6 tablet 0   hydrocortisone cream 1 % Apply 1 application topically 2 (two) times daily. 30 g 0   hyoscyamine (LEVSIN SL) 0.125 MG SL tablet Place 1 tablet (0.125 mg total) under the tongue every 6 (six) hours as needed. 40 tablet 0   levocetirizine (XYZAL) 5 MG tablet Take 1 tablet (5 mg total) by mouth every evening. For allergies 90 tablet 3   linaclotide (LINZESS) 145 MCG CAPS capsule Take 1 capsule (145 mcg total) by mouth daily before breakfast. 30 capsule 11   meloxicam (MOBIC) 15 MG tablet Take 15 mg by mouth daily.     nitroGLYCERIN (NITROSTAT) 0.4 MG SL tablet Place 1 tablet (0.4 mg total) under the tongue every 5 (five) minutes as needed for chest pain. 25 tablet 3   ondansetron (ZOFRAN) 4 MG tablet Take 1 tablet (4 mg total) by mouth every 8 (eight) hours as needed for nausea or vomiting. 4 tablet 0   ondansetron (ZOFRAN) 4 MG tablet Take 1 tablet (4 mg total)  by mouth every 6 (six) hours. 8 tablet 0   pantoprazole (PROTONIX) 40 MG tablet Take 1 tablet (40 mg total) by mouth daily. 90 tablet 3   Probiotic Product (ALIGN) 4 MG CAPS Take 1 capsule by mouth daily.     psyllium (METAMUCIL) 58.6 % packet Take 1 packet by mouth daily.     Rectal Protectant-Emollient (CALMOL-4) 76-10 % SUPP Place 1 suppository rectally as needed (for hemmorrhoids). 30 suppository 1   rosuvastatin (CRESTOR) 10 MG tablet Take 1 tablet (10 mg total) by mouth daily. 90 tablet 3   Simethicone (PHAZYME MAXIMUM STRENGTH) 250 MG CAPS Take 250 mg by mouth 3 (three) times daily. (Patient taking differently: Take 250 mg by mouth 3 (three) times daily as needed (gas relief).) 14 capsule    valACYclovir  (VALTREX) 1000 MG tablet TAKE 1 TABLET (1,000 MG TOTAL) BY MOUTH AT BEDTIME. 90 tablet 0   Pancrelipase, Lip-Prot-Amyl, (ZENPEP) 25000-79000 units CPEP Take 2 capsules by mouth 3 (three) times daily with meals. (Patient taking differently: Take 1 capsule by mouth 2 (two) times daily with a meal.) 180 capsule 3   Current Facility-Administered Medications  Medication Dose Route Frequency Provider Last Rate Last Admin   methylPREDNISolone acetate (DEPO-MEDROL) injection 40 mg  40 mg Intramuscular Once Gottschalk, Ashly M, DO        Allergies  Allergen Reactions   Chlordiazepoxide-Clidinium Itching   Ciprofloxacin Itching    Irritates stomach   Flagyl [Metronidazole]     Severe yeast infection   Sulfa Drugs Cross Reactors Nausea Only   Cefdinir Nausea And Vomiting   Nitrofurantoin Monohyd Macro Rash    Social History   Socioeconomic History   Marital status: Married    Spouse name: Not on file   Number of children: 0   Years of education: Not on file   Highest education level: Not on file  Occupational History   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    Employer: [employer]  Tobacco Use   Smoking status: Former    Packs/day: 1.00    Years: 20.00    Total pack years: 20.00    Types: Cigarettes    Quit date: 05/2020    Years since quitting: 1.2   Smokeless tobacco: Never   Tobacco comments:    USE SMOKELESS CIGARETTES  Vaping Use   Vaping Use: Some days   Substances: Nicotine  Substance and Sexual Activity   Alcohol use: Not Currently    Comment: 1 a week, wine   Drug use: No   Sexual activity: Not Currently  Other Topics Concern   Not on file  Social History Narrative   Not on file   Social Determinants of Health   Financial Resource Strain: Not on file  Food Insecurity: Not on file  Transportation Needs: Not on file  Physical Activity: Not on file  Stress: Not on file  Social Connections: Not on file  Intimate Partner  Violence: Not on file    Family History  Problem Relation Age of Onset   Hypertension Mother    Diabetes Father    Heart attack Father        Age 62   Colon cancer Neg Hx    Esophageal cancer Neg Hx    Rectal cancer Neg Hx     Review of Systems:  As stated in the HPI and otherwise negative.   BP 110/60   Pulse 73   Ht 5' (1.524  m)   Wt 100 lb 6.4 oz (45.5 kg)   SpO2 98%   BMI 19.61 kg/m   Physical Examination: General: Well developed, well nourished, NAD  HEENT: OP clear, mucus membranes moist  SKIN: warm, dry. No rashes. Neuro: No focal deficits  Musculoskeletal: Muscle strength 5/5 all ext  Psychiatric: Mood and affect normal  Neck: No JVD, no carotid bruits, no thyromegaly, no lymphadenopathy.  Lungs:Clear bilaterally, no wheezes, rhonci, crackles Cardiovascular: Regular rate and rhythm. No murmurs, gallops or rubs. Abdomen:Soft. Bowel sounds present. Non-tender.  Extremities: No lower extremity edema. Pulses are 2 + in the bilateral DP/PT.  EKG:  EKG is not ordered today. The ekg ordered today demonstrates   Coronary CTA 07/15/21: Aorta: Normal size.  Aortic atherosclerosis.  No dissection.   Aortic Valve:  Trileaflet.  No calcifications.   Coronary Arteries:  Normal coronary origin.  Right dominance.   RCA is a large dominant artery that gives rise to PDA and PLVB. There is severe (>70%) mixed plaque in the mid RCA.   Left main is a large artery that gives rise to LAD and LCX arteries.   LAD is a large vessel that has Minimal (1-24%) mixed plaque at the ostium and mild (25-49%) mixed plaque in the mid LAD.   LCX is a non-dominant artery with minimal (<25%) calcified plaque. The LCX gives rise to two OM branches without plaque.   Coronary Calcium Score:   Left main: 0   Left anterior descending artery: 36   Left circumflex artery: 0.61   Right coronary artery: 150   Total: 186   Percentile: 89th   Other findings:   Normal pulmonary vein  drainage into the left atrium.   Normal let atrial appendage without a thrombus.   Normal size of the pulmonary artery.   Study limited by slab reconstruction artifact.   IMPRESSION: 1. Coronary calcium score of 186. This was 89th percentile for age-, race-, and sex-matched controls.   2. Normal coronary origin with right dominance.   3. There appears to be severe (>70%) mixed plaque in the mid RCA. This is just proximal to a region of slab reconstruction artifact. Unable to send for FFRct due to artifact. Recommend functional imaging or coronary angiography to futher investigate. Otherwise non-obstructive CAD. CAD- RADS 4.   4.  Aortic atherosclerosis.  Recent Labs: 11/13/2020: TSH 1.170 07/09/2021: ALT 12; BUN 6; Creatinine, Ser 0.63; Hemoglobin 14.0; Platelets 201; Potassium 3.2; Sodium 142   Lipid Panel    Component Value Date/Time   CHOL 235 (H) 08/06/2019 0955   TRIG 133 08/06/2019 0955   HDL 72 08/06/2019 0955   CHOLHDL 3.3 08/06/2019 0955   LDLCALC 140 (H) 08/06/2019 0955     Wt Readings from Last 3 Encounters:  07/17/21 100 lb 6.4 oz (45.5 kg)  07/15/21 100 lb 6 oz (45.5 kg)  07/09/21 102 lb (46.3 kg)     Assessment and Plan:   1. CAD with unstable angina: Pt with recent chest pain concerning for unstable angina. Coronary CTA with severe mid RCA stenosis. Cardiac cath is indicated.  I have reviewed the risks, indications, and alternatives to cardiac catheterization, possible angioplasty, and stenting with the patient. Risks include but are not limited to bleeding, infection, vascular injury, stroke, myocardial infection, arrhythmia, kidney injury, radiation-related injury in the case of prolonged fluoroscopy use, emergency cardiac surgery, and death. The patient understands the risks of serious complication is 1-2 in 6269 with diagnostic cardiac cath and 1-2% or  less with angioplasty/stenting.   -Will plan cardiac cath at [facility] on 07/22/21 at 11:30 am with Dr.  Tamala Julian -Pre-cath labs today.  -Start ASA 81 mg daily.  -Start Crestor 10 mg daily -Will given her a prescription for SL NTG  Labs/ tests ordered today include:   Orders Placed This Encounter  Procedures   Basic Metabolic Panel (BMET)     Disposition:   F/U with Dr. Tamala Julian or office APP 2-3 weeks post cath.    Signed, Lauree Chandler, MD 07/17/2021 2:34 PM    [place], [place], [place]  [address] Phone: [phone]; Fax: [phone]

## 2021-07-18 LAB — BASIC METABOLIC PANEL
BUN/Creatinine Ratio: 6 — ABNORMAL LOW (ref 12–28)
BUN: 5 mg/dL — ABNORMAL LOW (ref 8–27)
CO2: 23 mmol/L (ref 20–29)
Calcium: 9.4 mg/dL (ref 8.7–10.3)
Chloride: 104 mmol/L (ref 96–106)
Creatinine, Ser: 0.77 mg/dL (ref 0.57–1.00)
Glucose: 102 mg/dL — ABNORMAL HIGH (ref 70–99)
Potassium: 3.5 mmol/L (ref 3.5–5.2)
Sodium: 141 mmol/L (ref 134–144)
eGFR: 86 mL/min/{1.73_m2} (ref >59)

## 2021-07-21 ENCOUNTER — Ambulatory Visit (HOSPITAL_COMMUNITY): Payer: BC Managed Care – PPO

## 2021-07-21 ENCOUNTER — Telehealth: Payer: Self-pay | Admitting: *Deleted

## 2021-07-21 NOTE — Telephone Encounter (Signed)
Cardiac Catheterization scheduled at [facility] for: Wednesday July 22, 2021 11:30 AM Arrival time and place: [place] Entrance A at: 9:30 AM   Nothing to eat after midnight prior to procedure, clear liquids until 5 AM day of procedure.  Medication instructions: -Usual morning medications can be taken with sips of water including aspirin 81 mg.  Confirmed patient has responsible adult to drive home post procedure and be with patient first 24 hours after arriving home.  Patient reports no new symptoms concerning for COVID-19 in the past 10 days.  Reviewed procedure instructions with patient.

## 2021-07-21 NOTE — H&P (Signed)
Low calcium score. Possible severe RCA. Typical and atypical features.

## 2021-07-22 ENCOUNTER — Encounter (HOSPITAL_COMMUNITY)
Admission: RE | Disposition: A | Discharge status: Home / Self Care | Payer: Self-pay | Source: Home / Self Care | Attending: Interventional Cardiology

## 2021-07-22 ENCOUNTER — Other Ambulatory Visit: Payer: Self-pay

## 2021-07-22 ENCOUNTER — Ambulatory Visit (HOSPITAL_COMMUNITY)
Admission: RE | Admit: 2021-07-22 | Discharge: 2021-07-22 | Disposition: A | Discharge status: Home / Self Care | Payer: BC Managed Care – PPO | Attending: Interventional Cardiology | Admitting: Interventional Cardiology

## 2021-07-22 DIAGNOSIS — I700 Atherosclerosis of aorta: Secondary | ICD-10-CM | POA: Diagnosis present

## 2021-07-22 DIAGNOSIS — I200 Unstable angina: Secondary | ICD-10-CM | POA: Diagnosis not present

## 2021-07-22 DIAGNOSIS — I25110 Atherosclerotic heart disease of native coronary artery with unstable angina pectoris: Secondary | ICD-10-CM | POA: Diagnosis not present

## 2021-07-22 DIAGNOSIS — K589 Irritable bowel syndrome without diarrhea: Secondary | ICD-10-CM | POA: Diagnosis not present

## 2021-07-22 DIAGNOSIS — R079 Chest pain, unspecified: Secondary | ICD-10-CM | POA: Diagnosis present

## 2021-07-22 DIAGNOSIS — K219 Gastro-esophageal reflux disease without esophagitis: Secondary | ICD-10-CM | POA: Insufficient documentation

## 2021-07-22 DIAGNOSIS — Z87891 Personal history of nicotine dependence: Secondary | ICD-10-CM | POA: Diagnosis not present

## 2021-07-22 DIAGNOSIS — E785 Hyperlipidemia, unspecified: Secondary | ICD-10-CM | POA: Insufficient documentation

## 2021-07-22 DIAGNOSIS — I10 Essential (primary) hypertension: Secondary | ICD-10-CM | POA: Diagnosis not present

## 2021-07-22 DIAGNOSIS — I341 Nonrheumatic mitral (valve) prolapse: Secondary | ICD-10-CM | POA: Diagnosis not present

## 2021-07-22 DIAGNOSIS — E7800 Pure hypercholesterolemia, unspecified: Secondary | ICD-10-CM | POA: Diagnosis present

## 2021-07-22 DIAGNOSIS — I2510 Atherosclerotic heart disease of native coronary artery without angina pectoris: Secondary | ICD-10-CM

## 2021-07-22 HISTORY — PX: INTRAVASCULAR PRESSURE WIRE/FFR STUDY: CATH118243

## 2021-07-22 HISTORY — PX: LEFT HEART CATH AND CORONARY ANGIOGRAPHY: CATH118249

## 2021-07-22 LAB — POCT ACTIVATED CLOTTING TIME: Activated Clotting Time: 335 seconds

## 2021-07-22 SURGERY — LEFT HEART CATH AND CORONARY ANGIOGRAPHY
Anesthesia: LOCAL

## 2021-07-22 MED ORDER — SODIUM CHLORIDE 0.9 % WEIGHT BASED INFUSION: 1.0000 mL/kg/h | INTRAVENOUS | Status: DC

## 2021-07-22 MED ORDER — ROSUVASTATIN CALCIUM 10 MG PO TABS: 10.0000 mg | ORAL_TABLET | ORAL | 3 refills | Status: DC

## 2021-07-22 MED ORDER — VERAPAMIL HCL 2.5 MG/ML IV SOLN
INTRAVENOUS | Status: DC | PRN
  Administered 2021-07-22 (×2): 10 mL via INTRA_ARTERIAL

## 2021-07-22 MED ORDER — LIDOCAINE HCL (PF) 1 % IJ SOLN
INTRAMUSCULAR | Status: AC
  Filled 2021-07-22: qty 30

## 2021-07-22 MED ORDER — IOHEXOL 350 MG/ML SOLN
INTRAVENOUS | Status: DC | PRN
  Administered 2021-07-22: 80 mL

## 2021-07-22 MED ORDER — FENTANYL CITRATE (PF) 100 MCG/2ML IJ SOLN
INTRAMUSCULAR | Status: DC | PRN
  Administered 2021-07-22: 25 ug via INTRAVENOUS

## 2021-07-22 MED ORDER — SODIUM CHLORIDE 0.9 % WEIGHT BASED INFUSION
3.0000 mL/kg/h | INTRAVENOUS | Status: AC
  Administered 2021-07-22: 3 mL/kg/h via INTRAVENOUS

## 2021-07-22 MED ORDER — HEPARIN (PORCINE) IN NACL 1000-0.9 UT/500ML-% IV SOLN
INTRAVENOUS | Status: AC
  Filled 2021-07-22: qty 1000

## 2021-07-22 MED ORDER — HEPARIN (PORCINE) IN NACL 1000-0.9 UT/500ML-% IV SOLN
INTRAVENOUS | Status: DC | PRN
  Administered 2021-07-22 (×2): 500 mL

## 2021-07-22 MED ORDER — LIDOCAINE HCL (PF) 1 % IJ SOLN
INTRAMUSCULAR | Status: DC | PRN
  Administered 2021-07-22: 2 mL via INTRADERMAL

## 2021-07-22 MED ORDER — FENTANYL CITRATE (PF) 100 MCG/2ML IJ SOLN
INTRAMUSCULAR | Status: AC
  Filled 2021-07-22: qty 2

## 2021-07-22 MED ORDER — MIDAZOLAM HCL 2 MG/2ML IJ SOLN
INTRAMUSCULAR | Status: DC | PRN
  Administered 2021-07-22 (×2): 1 mg via INTRAVENOUS

## 2021-07-22 MED ORDER — HEPARIN SODIUM (PORCINE) 1000 UNIT/ML IJ SOLN
INTRAMUSCULAR | Status: AC
  Filled 2021-07-22: qty 10

## 2021-07-22 MED ORDER — MIDAZOLAM HCL 2 MG/2ML IJ SOLN
INTRAMUSCULAR | Status: AC
  Filled 2021-07-22: qty 2

## 2021-07-22 MED ORDER — ASPIRIN 81 MG PO CHEW: 81.0000 mg | CHEWABLE_TABLET | ORAL | Status: DC

## 2021-07-22 MED ORDER — SODIUM CHLORIDE 0.9 % IV SOLN: 250.0000 mL | INTRAVENOUS | Status: DC | PRN

## 2021-07-22 MED ORDER — HEPARIN SODIUM (PORCINE) 1000 UNIT/ML IJ SOLN
INTRAMUSCULAR | Status: DC | PRN
  Administered 2021-07-22: 2500 [IU] via INTRAVENOUS
  Administered 2021-07-22: 5000 [IU] via INTRAVENOUS

## 2021-07-22 MED ORDER — SODIUM CHLORIDE 0.9% FLUSH: 3.0000 mL | Freq: Two times a day (BID) | INTRAVENOUS | Status: DC

## 2021-07-22 MED ORDER — VERAPAMIL HCL 2.5 MG/ML IV SOLN
INTRAVENOUS | Status: AC
  Filled 2021-07-22: qty 2

## 2021-07-22 MED ORDER — SODIUM CHLORIDE 0.9% FLUSH: 3.0000 mL | INTRAVENOUS | Status: DC | PRN

## 2021-07-22 SURGICAL SUPPLY — 13 items
CATH 5FR JL3.5 JR4 ANG PIG MP (CATHETERS) ×1 IMPLANT
CATH VISTA GUIDE 6FR JR4 (CATHETERS) ×1 IMPLANT
GLIDESHEATH SLEND A-KIT 6F 22G (SHEATH) ×1 IMPLANT
GUIDEWIRE INQWIRE 1.5J.035X260 (WIRE) IMPLANT
GUIDEWIRE PRESSURE X 175 (WIRE) ×1 IMPLANT
INQWIRE 1.5J .035X260CM (WIRE) ×2
KIT ESSENTIALS PG (KITS) ×1 IMPLANT
KIT HEART LEFT (KITS) ×2 IMPLANT
PACK CARDIAC CATHETERIZATION (CUSTOM PROCEDURE TRAY) ×2 IMPLANT
SHEATH PROBE COVER 6X72 (BAG) ×1 IMPLANT
TRANSDUCER W/STOPCOCK (MISCELLANEOUS) ×2 IMPLANT
TUBING CIL FLEX 10 FLL-RA (TUBING) ×2 IMPLANT
WIRE HI TORQ VERSACORE-J 145CM (WIRE) ×1 IMPLANT

## 2021-07-22 NOTE — Interval H&P Note (Signed)
Cath Lab Visit (complete for each Cath Lab visit)  Clinical Evaluation Leading to the Procedure:   ACS: No.  Non-ACS:    Anginal Classification: CCS III  Anti-ischemic medical therapy: Minimal Therapy (1 class of medications)  Non-Invasive Test Results: High-risk stress test findings: cardiac mortality >3%/year  Prior CABG: No previous CABG      History and Physical Interval Note:  07/22/2021 12:03 PM  Ruth Terry  has presented today for surgery, with the diagnosis of unstable angina.  The various methods of treatment have been discussed with the patient and family. After consideration of risks, benefits and other options for treatment, the patient has consented to  Procedure(s): LEFT HEART CATH AND CORONARY ANGIOGRAPHY (N/A) as a surgical intervention.  The patient's history has been reviewed, patient examined, no change in status, stable for surgery.  I have reviewed the patient's chart and labs.  Questions were answered to the patient's satisfaction.     Belva Crome III

## 2021-07-22 NOTE — Progress Notes (Signed)
Patient was given discharge instructions. She verbalized understanding. 

## 2021-07-22 NOTE — Discharge Instructions (Signed)

## 2021-07-22 NOTE — CV Procedure (Signed)
Mid eccentric 50 to 70% RCA stenosis with RFR 0.91 (significant if </=.89). Other arteries are widely patent. Normal LV function.  EDP 8 mmHg  Overall, moderate mid RCA disease.  Plan aggressive risk factor modification including LDL less than 70.  Heparin daily.  Chest discomfort can last hours and is felt to be unlikely related to ischemia.

## 2021-07-23 ENCOUNTER — Encounter (HOSPITAL_COMMUNITY): Payer: Self-pay | Admitting: Interventional Cardiology

## 2021-07-24 ENCOUNTER — Telehealth: Payer: Self-pay | Admitting: Interventional Cardiology

## 2021-07-24 NOTE — Telephone Encounter (Signed)
Per Dr. Tamala Julian: There is no big recovery phase after cath. Okay to lift. 2 days after the procedure.   Patient verbalized understanding and expressed appreciation for callback.

## 2021-07-24 NOTE — Telephone Encounter (Signed)
Returned call to patient.  She states she has a small knot at insertion site after heart cath (7/19). She denies any bleeding or s/sx of infection. Informed patient this is not unsual, the knot will get smaller over time as the site heals. Advised patient to continue to monitor for bleeding, pain, swelling, redness, or warmth.  Patient also states she has been experiencing nausea and constipation with gassiness since having CT scan with contrast. She is taking mylanta and sennokot. Encouraged patient to stay hydrated and walk around as much as possible to help with gassiness/constipation. Patient verbalized understanding.  Patient also asked how long does it take to "start feeling like myself" after a heart cath. Patient denies CP or SOB, but has felt nauseated and "not well" since heart cath. Patient also states she has recently recovered from strep and having multiple procedures performed in a short timeframe. She states she also has gastroparesis and gallstones.   Patient would like to know if Dr. Tamala Julian thinks it would be okay for her to lift her 12 lb dog Saturday night, if it has been long enough since her heart cath to be able to do that.  Will forward to Dr. Tamala Julian to review and advise.

## 2021-07-24 NOTE — Telephone Encounter (Signed)
Pt would like for nurse to callback regarding questions that's he has about upcoming Cath appt. Please advise

## 2021-07-27 ENCOUNTER — Telehealth: Payer: Self-pay | Admitting: Interventional Cardiology

## 2021-07-27 DIAGNOSIS — F418 Other specified anxiety disorders: Secondary | ICD-10-CM | POA: Diagnosis not present

## 2021-07-27 DIAGNOSIS — E7800 Pure hypercholesterolemia, unspecified: Secondary | ICD-10-CM

## 2021-07-27 DIAGNOSIS — N898 Other specified noninflammatory disorders of vagina: Secondary | ICD-10-CM | POA: Diagnosis not present

## 2021-07-27 DIAGNOSIS — J020 Streptococcal pharyngitis: Secondary | ICD-10-CM | POA: Diagnosis not present

## 2021-07-27 MED ORDER — EZETIMIBE 10 MG PO TABS: 10.0000 mg | ORAL_TABLET | Freq: Every day | ORAL | 3 refills | Status: AC

## 2021-07-27 NOTE — Telephone Encounter (Signed)
Pt c/o medication issue:  1. Name of Medication: rosuvastatin (CRESTOR) 10 MG tablet  2. How are you currently taking this medication (dosage and times per day)? As written   3. Are you having a reaction (difficulty breathing--STAT)? no  4. What is your medication issue? Pt states that this medication is making her arms and legs ache, she states the aching is really bad to the point where she had to call out of work and she can't move that much.

## 2021-07-27 NOTE — Telephone Encounter (Signed)
Returned call to patient to discuss Dr. Thompson Caul recommendations.  Per Dr. Tamala Julian: Stop rosuvastatin.  Needs appointment in the lipid clinic as soon as we can accommodate her.  She needs risk factor modification to prevent progression.  In the short-term, I recommend Zetia 10 mg daily.   Patient verbalized understanding and agrees with plan above.  Zetia '10mg'$  QD sent to pharmacy of choice.  Referral to Lipid Clinic made.

## 2021-07-30 ENCOUNTER — Telehealth: Payer: Self-pay | Admitting: Internal Medicine

## 2021-07-30 NOTE — Telephone Encounter (Signed)
Patient called requesting to speak with a nurse regarding issues with gallstones.

## 2021-07-31 ENCOUNTER — Other Ambulatory Visit: Payer: Self-pay

## 2021-07-31 MED ORDER — HYOSCYAMINE SULFATE 0.125 MG SL SUBL: 0.1250 mg | SUBLINGUAL_TABLET | Freq: Four times a day (QID) | SUBLINGUAL | 0 refills | Status: DC | PRN

## 2021-07-31 NOTE — Telephone Encounter (Signed)
Returned call to patient. She states that she has been through a lot during the past month. She reports that she had strep throat, they found a lung nodule, she had a heart scan and had a heart cath last week. No stent was placed, but she is on new medications. Pt states that she is concerned about her gallstones. Her appt with [facility] is not until 08/17/21. Pt reports that they also offered 08/10/21 but she has a cardiac follow up that day. Pt reports intermittent RUQ pain that radiates to her mid back. Pt reports nausea, but no vomiting. Denies fever and she is tolerating solid foods. She is concerned that she may need more imaging to keep an eye on the gallstones until her [facility] appt. Pt is also concerned that she may have a blockage. She reports that her last BM was Sunday, she has only passed "a little piece" since then. Pt reports that her current regimen is Metamucil 1 packet daily, 6 oz of prune juice daily, Senakot, and glycerin suppositories. Pt states that she has tried a Miralax purge before and she had "1 squirt of diarrhea". Miralax does not work for her. Reports lower abdominal cramping and pain that has been consistent for the last week. Please advise, thanks.

## 2021-07-31 NOTE — Telephone Encounter (Signed)
1.  For constipation I would recommend returning to [place].  Take multiple doses until she achieves bowel movements.  Thereafter, would recommend one half dose or 1 dose daily.  Hold for diarrhea 2.  If her abdominal pain is severe, she should go to the emergency room for evaluation.  Gallstones are a surgical problem.  No medications or other remedies. Thank you for reaching out to [place]. Dr. Henrene Pastor

## 2021-07-31 NOTE — Telephone Encounter (Signed)
Called and spoke with patient regarding Dr. Blanch Media recommendations as outlined below. Pt requested refill of Levsin be sent to pharmacy on file in [place]. Pt verbalized understanding and had no concerns at the end of the call.

## 2021-08-03 ENCOUNTER — Other Ambulatory Visit (HOSPITAL_COMMUNITY): Payer: Self-pay

## 2021-08-04 ENCOUNTER — Ambulatory Visit: Payer: BC Managed Care – PPO | Admitting: Urology

## 2021-08-05 ENCOUNTER — Ambulatory Visit: Payer: BC Managed Care – PPO | Admitting: Internal Medicine

## 2021-08-06 ENCOUNTER — Ambulatory Visit: Payer: BC Managed Care – PPO | Admitting: Internal Medicine

## 2021-08-10 ENCOUNTER — Ambulatory Visit: Payer: BC Managed Care – PPO | Admitting: Physician Assistant

## 2021-08-10 ENCOUNTER — Encounter: Payer: Self-pay | Admitting: Physician Assistant

## 2021-08-10 VITALS — BP 108/60 | HR 88 | Ht 60.0 in | Wt 100.0 lb

## 2021-08-10 DIAGNOSIS — E785 Hyperlipidemia, unspecified: Secondary | ICD-10-CM | POA: Diagnosis not present

## 2021-08-10 DIAGNOSIS — Z01810 Encounter for preprocedural cardiovascular examination: Secondary | ICD-10-CM | POA: Diagnosis not present

## 2021-08-10 DIAGNOSIS — I2510 Atherosclerotic heart disease of native coronary artery without angina pectoris: Secondary | ICD-10-CM | POA: Diagnosis not present

## 2021-08-10 NOTE — Patient Instructions (Signed)
Medication Instructions:  Your physician recommends that you continue on your current medications as directed. Please refer to the Current Medication list given to you today.  *If you need a refill on your cardiac medications before your next appointment, please call your pharmacy*   Lab Work: None ordered  If you have labs (blood work) drawn today and your tests are completely normal, you will receive your results only by: [place] (if you have MyChart) OR A paper copy in the mail If you have any lab test that is abnormal or we need to change your treatment, we will call you to review the results.   Testing/Procedures: None ordered   Follow-Up: At [facility], you and your health needs are our priority.  As part of our continuing mission to provide you with exceptional heart care, we have created designated Provider Care Teams.  These Care Teams include your primary Cardiologist (physician) and Advanced Practice Providers (APPs -  Physician Assistants and Nurse Practitioners) who all work together to provide you with the care you need, when you need it.  We recommend signing up for the patient portal called "MyChart".  Sign up information is provided on this After Visit Summary.  MyChart is used to connect with patients for Virtual Visits (Telemedicine).  Patients are able to view lab/test results, encounter notes, upcoming appointments, etc.  Non-urgent messages can be sent to your provider as well.   To learn more about what you can do with MyChart, go to [email or web address].    Your next appointment:   4 month(s)  The format for your next appointment:   In Person  Provider:   Sinclair Grooms, MD     Other Instructions Remember to make an appointment with the [facility]

## 2021-08-10 NOTE — Progress Notes (Signed)
Cardiology Office Note:    Date:  08/10/2021   ID:  Ruth Terry, DOB 1955-12-02, MRN [id number]  PCP:  Rebekah Chesterfield, NP  [facility] Cardiologist:  Lesleigh Noe, MD  [facility] Electrophysiologist:  None   Chief Complaint: Cath follow up   History of Present Illness:    Ruth Terry is a 66 y.o. female with a hx of mitral valve prolapse, hyperlipidemia, tobacco abuse, chronic constipation, IBS, GERD, diverticulosis, gastroparesis and HTN seen for follow up.   Seen in the office by Dr. Katrinka Blazing on 06/17/21 and had c/o chest pain and dyspnea with exertion. Coronary CTA 07/15/21 with calcium score of 186. Severe mid RCA stenosis. Mild plaque in the LAD and Circumflex. She was seen in the ED 07/09/21 with epigastric pain and nausea. CT abdomen suspicious for acute cholecystitis.   Cath 07/22/21 INTRAVASCULAR PRESSURE WIRE/FFR STUDY  LEFT HEART CATH AND CORONARY ANGIOGRAPHY   Conclusion  CONCLUSIONS: Widely patent left main Proximal calcified LAD 20% stenosis. Widely patent circumflex with proximal to mid 30% plaque. RCA with 30% proximal and 50 to 70% mid within an angulation.  RFR beyond the mid stenosis was 0.91 (abnormal </= 0.89). Normal left ventricular function with EF 65%.  EDP is normal.   RECOMMENDATIONS: Smoking. LDL less than 55.  Has only been on statins for 7 days and is complaining of leg discomfort.  Would decrease to Monday, Wednesday, and Friday dosing (10 mg tablets). Exercise and consider cardiac rehab.   Here today for follow up.  She did not tolerated even lower dose of Crestor.  Now on Zetia.  She denies chest pain, shortness of breath, orthopnea, PND, syncope or melena.  She is seeing surgeon next week for gallbladder removal.  She is interested in cardiac rehab.    Past Medical History:  Diagnosis Date   Allergy    Anal fissure    Cataract    Chronic constipation    Diverticulosis 04/20/2010   colonoscopy   Gastroparesis    GERD  (gastroesophageal reflux disease)    Hemorrhoids    Hyperlipidemia    IBS (irritable bowel syndrome)    MVP (mitral valve prolapse)    Osteoporosis    Polyp, stomach 04/20/2010   egd   Shingles    UTI (lower urinary tract infection)     Past Surgical History:  Procedure Laterality Date   COLONOSCOPY     ESOPHAGOGASTRODUODENOSCOPY     INTRAVASCULAR PRESSURE WIRE/FFR STUDY N/A 07/22/2021   Procedure: INTRAVASCULAR PRESSURE WIRE/FFR STUDY;  Surgeon: Lyn Records, MD;  Location: [facility] INVASIVE CV LAB;  Service: Cardiovascular;  Laterality: N/A;   LEFT HEART CATH AND CORONARY ANGIOGRAPHY N/A 07/22/2021   Procedure: LEFT HEART CATH AND CORONARY ANGIOGRAPHY;  Surgeon: Lyn Records, MD;  Location: [facility] INVASIVE CV LAB;  Service: Cardiovascular;  Laterality: N/A;    Current Medications: Current Meds  Medication Sig   alfuzosin (UROXATRAL) 10 MG 24 hr tablet Take 1 tablet (10 mg total) by mouth daily with breakfast.   aspirin EC 81 MG tablet Take 1 tablet (81 mg total) by mouth daily. Swallow whole.   ezetimibe (ZETIA) 10 MG tablet Take 1 tablet (10 mg total) by mouth daily.   famotidine (PEPCID) 20 MG tablet One after supper   HYDROcodone-acetaminophen (NORCO/VICODIN) 5-325 MG tablet Take 2 tablets by mouth every 4 (four) hours as needed.   hyoscyamine (LEVSIN SL) 0.125 MG SL tablet Place 1 tablet (0.125 mg total) under the  tongue every 6 (six) hours as needed.   linaclotide (LINZESS) 145 MCG CAPS capsule Take 1 capsule (145 mcg total) by mouth daily before breakfast.   nitroGLYCERIN (NITROSTAT) 0.4 MG SL tablet Place 1 tablet (0.4 mg total) under the tongue every 5 (five) minutes as needed for chest pain.   ondansetron (ZOFRAN) 4 MG tablet Take 1 tablet (4 mg total) by mouth every 8 (eight) hours as needed for nausea or vomiting.   pantoprazole (PROTONIX) 40 MG tablet Take 1 tablet (40 mg total) by mouth daily.   psyllium (METAMUCIL) 58.6 % packet Take 1 packet by mouth every evening.    Simethicone (PHAZYME MAXIMUM STRENGTH) 250 MG CAPS Take 250 mg by mouth 3 (three) times daily. (Patient taking differently: Take 250 mg by mouth 3 (three) times daily as needed (gas relief).)   valACYclovir (VALTREX) 1000 MG tablet TAKE 1 TABLET (1,000 MG TOTAL) BY MOUTH AT BEDTIME.   Current Facility-Administered Medications for the 08/10/21 encounter (Office Visit) with Manson Passey, PA  Medication   methylPREDNISolone acetate (DEPO-MEDROL) injection 40 mg     Allergies:   Chlordiazepoxide-clidinium, Ciprofloxacin, Flagyl [metronidazole], Sulfa drugs cross reactors, Nitrofurantoin monohyd macro, and Omnicef [cefdinir]   Social History   Socioeconomic History   Marital status: Married    Spouse name: Not on file   Number of children: 0   Years of education: Not on file   Highest education level: Not on file  Occupational History   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    Employer: [employer]  Tobacco Use   Smoking status: Some Days    Packs/day: 1.00    Years: 20.00    Total pack years: 20.00    Types: Cigarettes    Last attempt to quit: 05/2020    Years since quitting: 1.2   Smokeless tobacco: Never   Tobacco comments:    USE SMOKELESS CIGARETTES  Vaping Use   Vaping Use: Some days   Substances: Nicotine  Substance and Sexual Activity   Alcohol use: Not Currently    Comment: 1 a week, wine   Drug use: No   Sexual activity: Not Currently  Other Topics Concern   Not on file  Social History Narrative   Not on file   Social Determinants of Health   Financial Resource Strain: Not on file  Food Insecurity: Not on file  Transportation Needs: Not on file  Physical Activity: Not on file  Stress: Not on file  Social Connections: Not on file     Family History: The patient's family history includes Diabetes in her father; Heart attack in her father; Hypertension in her mother. There is no history of Colon cancer, Esophageal  cancer, or Rectal cancer.    ROS:   Please see the history of present illness.    All other systems reviewed and are negative.   EKGs/Labs/Other Studies Reviewed:    The following studies were reviewed today:  Cath 07/2021 Diagnostic Dominance: Right   EKG:  EKG is not  ordered today.   Recent Labs: 11/13/2020: TSH 1.170 07/09/2021: ALT 12; Hemoglobin 14.0; Platelets 201 07/17/2021: BUN 5; Creatinine, Ser 0.77; Potassium 3.5; Sodium 141  Recent Lipid Panel    Component Value Date/Time   CHOL 235 (H) 08/06/2019 0955   TRIG 133 08/06/2019 0955   HDL 72 08/06/2019 0955   CHOLHDL 3.3 08/06/2019 0955   LDLCALC 140 (H) 08/06/2019 0955     Physical Exam:  VS:  BP 108/60   Pulse 88   Ht 5' (1.524 m)   Wt 100 lb (45.4 kg)   BMI 19.53 kg/m     Wt Readings from Last 3 Encounters:  08/10/21 100 lb (45.4 kg)  07/22/21 101 lb (45.8 kg)  07/17/21 100 lb 6.4 oz (45.5 kg)     GEN:  Well nourished, well developed in no acute distress HEENT: Normal NECK: No JVD; No carotid bruits LYMPHATICS: No lymphadenopathy CARDIAC: RRR, no murmurs, rubs, gallops RESPIRATORY:  Clear to auscultation without rales, wheezing or rhonchi  ABDOMEN: Soft, non-tender, non-distended MUSCULOSKELETAL:  No edema; No deformity  SKIN: Warm and dry NEUROLOGIC:  Alert and oriented x 3 PSYCHIATRIC:  Normal affect   ASSESSMENT AND PLAN:   CAD Cath report as above.  Plan for medical management.  Continue aspirin & Zetia.  Will get cardiac rehab referral.   2.  Hyperlipidemia -Did not tolerated low-dose of Crestor.  Now on Zetia.  Will get lipid clinic referral.  3.  Surgical clearance -Patient likely has upcoming gallbladder removal and cataract surgery.  Given cath report she will be cleared at acceptable risk.  She may hold her aspirin if required.  Medication Adjustments/Labs and Tests Ordered: Current medicines are reviewed at length with the patient today.  Concerns regarding medicines are  outlined above.  Orders Placed This Encounter  Procedures   AMB referral to cardiac rehabilitation   No orders of the defined types were placed in this encounter.   Patient Instructions  Medication Instructions:  Your physician recommends that you continue on your current medications as directed. Please refer to the Current Medication list given to you today.  *If you need a refill on your cardiac medications before your next appointment, please call your pharmacy*   Lab Work: None ordered  If you have labs (blood work) drawn today and your tests are completely normal, you will receive your results only by: MyChart Message (if you have MyChart) OR A paper copy in the mail If you have any lab test that is abnormal or we need to change your treatment, we will call you to review the results.   Testing/Procedures: None ordered   Follow-Up: At [facility], you and your health needs are our priority.  As part of our continuing mission to provide you with exceptional heart care, we have created designated Provider Care Teams.  These Care Teams include your primary Cardiologist (physician) and Advanced Practice Providers (APPs -  Physician Assistants and Nurse Practitioners) who all work together to provide you with the care you need, when you need it.  We recommend signing up for the patient portal called "MyChart".  Sign up information is provided on this After Visit Summary.  MyChart is used to connect with patients for Virtual Visits (Telemedicine).  Patients are able to view lab/test results, encounter notes, upcoming appointments, etc.  Non-urgent messages can be sent to your provider as well.   To learn more about what you can do with MyChart, go to [email or web address].    Your next appointment:   4 month(s)  The format for your next appointment:   In Person  Provider:   Lesleigh Noe, MD     Other Instructions Remember to make an appointment with the Lipid  Clinic  Important Information About [place]         Lorelei Pont, [place]  08/10/2021 2:10 PM    [facility]

## 2021-08-14 DIAGNOSIS — H10013 Acute follicular conjunctivitis, bilateral: Secondary | ICD-10-CM | POA: Diagnosis not present

## 2021-08-17 DIAGNOSIS — K8010 Calculus of gallbladder with chronic cholecystitis without obstruction: Secondary | ICD-10-CM | POA: Diagnosis not present

## 2021-08-18 ENCOUNTER — Ambulatory Visit: Payer: Self-pay | Admitting: Surgery

## 2021-08-18 DIAGNOSIS — K8020 Calculus of gallbladder without cholecystitis without obstruction: Secondary | ICD-10-CM | POA: Insufficient documentation

## 2021-08-18 NOTE — H&P (Signed)
History of Present Illness: Ruth Terry is a 66 y.o. female who was referred to me for evaluation of gallstones. In early July, she had an episode of severe abdominal pain, in the RUQ with radiation around the right flank to the back. This prompted her to go to the ED on July 6. Labs including LFTs and lipase at that time were normal. A CT scan and RUQ [place] both showed mild pericholecystic fluid, gallbladder wall-thickening and gallstones. The patient preferred outpatient surgery follow up. She has continued to have intermittent RUQ pain and abdominal discomfort, often after eating. She also has chronic constipation and other chronic GI symptoms, for which she follows with Dr. Henrene Pastor at [place] and has previously had an extensive workup.   She also had a cardiac workup last month for chest pain, and had a LHC on 7/19. This showed stenosis of the RCA and proximal LAD, and medical management was recommended. She recently saw cardiology on 8/7 and per their note she is cleared as an acceptable risk for surgery.     Review of Systems: A complete review of systems was obtained from the patient.  I have reviewed this information and discussed as appropriate with the patient.  See HPI as well for other ROS.       Medical History: Past Medical History Past Medical History: Diagnosis Date  Coronary artery disease    GERD (gastroesophageal reflux disease)    Hyperlipidemia        Patient Active Problem List Diagnosis  RUQ abdominal pain  Gallstones     Past Surgical History History reviewed. No pertinent surgical history.     Allergies Allergies Allergen Reactions  Sulfa (Sulfonamide Antibiotics) Nausea  Ciprofloxacin Itching and Nausea     Irritates stomach  Nitrofurantoin Monohyd/M-Cryst Rash and Itching      Current Outpatient Medications on File Prior to Visit Medication Sig Dispense Refill  ezetimibe (ZETIA) 10 mg tablet Take 1 tablet by mouth once daily      linaCLOtide  (LINZESS) 145 mcg capsule Take by mouth      pantoprazole (PROTONIX) 40 MG DR tablet Take 40 mg by mouth once daily      valACYclovir (VALTREX) 1000 MG tablet Take by mouth       No current facility-administered medications on file prior to visit.     Family History Family History Problem Relation Age of Onset  High blood pressure (Hypertension) Mother    Diabetes Father    Coronary Artery Disease (Blocked arteries around heart) Father    Hyperlipidemia (Elevated cholesterol) Father        Social History   Tobacco Use Smoking Status Every Day  Types: Cigarettes Smokeless Tobacco Not on file     Social HistoryExpand by Default Social History    Socioeconomic History  Marital status: Married Tobacco Use  Smoking status: Every Day     Types: Cigarettes Substance and Sexual Activity  Alcohol use: Not Currently  Drug use: Never      Objective:     Vitals:   08/17/21 1533 BP: 112/66 Pulse: 78 Temp: 37.1 C (98.8 F) SpO2: 93% Weight: 45.7 kg (100 lb 12.8 oz) Height: 152.4 cm (5')   Body mass index is 19.69 kg/m.   Physical Exam Vitals reviewed.  Constitutional:      General: She is not in acute distress.    Appearance: Normal appearance.  HENT:     Head: Normocephalic and atraumatic.  Eyes:     General: No  scleral icterus.    Conjunctiva/sclera: Conjunctivae normal.  Cardiovascular:     Rate and Rhythm: Normal rate and regular rhythm.  Pulmonary:     Effort: Pulmonary effort is normal. No respiratory distress.     Breath sounds: Normal breath sounds. No wheezing.  Abdominal:     General: There is no distension.     Palpations: Abdomen is soft.     Tenderness: There is no abdominal tenderness.  Musculoskeletal:        General: No swelling. Normal range of motion.     Cervical back: Normal range of motion.  Skin:    General: Skin is warm and dry.  Neurological:     General: No focal deficit present.     Mental Status: She is alert and oriented  to person, place, and time.  Psychiatric:        Mood and Affect: Mood normal.        Behavior: Behavior normal.        Thought Content: Thought content normal.        Assessment and Plan: Diagnoses and all orders for this visit:   Calculus of gallbladder with chronic cholecystitis without obstruction       This is a 66 yo female with RUQ abdominal pain, and imaging evidence of cholelithiasis with chronic cholecystitis. Laparoscopic cholecystectomy was recommended. The details of this procedure were discussed with the patient, including the risks of bleeding, infection, bile leak, and <0.5% risk of common bile duct injury. The patient expressed understanding and agrees to proceed with surgery. She has already received preoperative cardiology clearance.   She does NOT need to hold aspirin for surgery and should continue taking this throughout the perioperative period. She will be contacted to schedule an elective surgery date.  Michaelle Birks, MD [facility] Surgery General, Hepatobiliary and Pancreatic Surgery 08/18/21 7:42 AM

## 2021-08-19 DIAGNOSIS — K6289 Other specified diseases of anus and rectum: Secondary | ICD-10-CM | POA: Diagnosis not present

## 2021-08-19 DIAGNOSIS — H04123 Dry eye syndrome of bilateral lacrimal glands: Secondary | ICD-10-CM | POA: Diagnosis not present

## 2021-08-19 DIAGNOSIS — K5909 Other constipation: Secondary | ICD-10-CM | POA: Diagnosis not present

## 2021-08-19 DIAGNOSIS — K648 Other hemorrhoids: Secondary | ICD-10-CM | POA: Diagnosis not present

## 2021-09-01 ENCOUNTER — Telehealth: Payer: Self-pay | Admitting: *Deleted

## 2021-09-01 NOTE — Telephone Encounter (Signed)
Name: Ruth Terry DOB: Apr 01, 1955  MRN: [id number]  Primary Cardiologist: Lesleigh Noe, MD  Chart reviewed as part of pre-operative protocol coverage.   Pt seen by Manson Passey, PA-C 08/10/21. Surgical clearance was provided at that time for cataract and gallbladder surgery. Based upon recent recommendations, patient is at acceptable risk for multiple dental extractions.    Recommendations: Based on ACC/AHA guidelines, the patient is at acceptable risk for the planned procedure and may proceed without further cardiovascular testing.  ASA can be held for 7 days if bleeding risk is felt to be too great.  Office note will also be send to requesting dentist.    Please call with questions. Tereso Newcomer, PA-C 09/01/2021, 10:32 PM

## 2021-09-01 NOTE — Telephone Encounter (Signed)
Notes faxed to surgeon. This phone note will be removed from the preop pool. Richardson Dopp, PA-C  09/01/2021 10:37 PM

## 2021-09-01 NOTE — Telephone Encounter (Signed)
Pre-operative Risk Assessment    Patient Name: Ruth Terry  DOB: November 30, 1955 MRN: [id number]     Request for Surgical Clearance    Procedure:  Dental Extraction - Amount of Teeth to be Pulled:  8 TEETH (PER DR. HILL, DDS SURGICAL EXTRACTIONS LOOK TO BE ROUTINE AND COMPLICATIONS ARE ANTICIPATED)   Date of Surgery:  Clearance TBD                                 Surgeon:  DR. MARK HILL, DDS Surgeon's Group or Practice Name:  DR. MARK HILL, DDS, [facility] Phone number:  [phone] Fax number:  [phone] ATTN: GRETCHEN    Type of Clearance Requested:   - Medical ; ASA   Type of Anesthesia:  Local ; EPI WILL BE USED ONLY IF NEEDED, IF SO THEN AMOUNT TO NOT EXCEED 0.04 MG   Additional requests/questions:    Elpidio Anis   09/01/2021, 12:42 PM

## 2021-09-02 ENCOUNTER — Ambulatory Visit (INDEPENDENT_AMBULATORY_CARE_PROVIDER_SITE_OTHER): Payer: BC Managed Care – PPO | Admitting: Physician Assistant

## 2021-09-02 ENCOUNTER — Encounter: Payer: Self-pay | Admitting: Physician Assistant

## 2021-09-02 VITALS — BP 133/79 | HR 97

## 2021-09-02 DIAGNOSIS — N390 Urinary tract infection, site not specified: Secondary | ICD-10-CM

## 2021-09-02 DIAGNOSIS — R1030 Lower abdominal pain, unspecified: Secondary | ICD-10-CM | POA: Diagnosis not present

## 2021-09-02 DIAGNOSIS — R3914 Feeling of incomplete bladder emptying: Secondary | ICD-10-CM

## 2021-09-02 DIAGNOSIS — M545 Low back pain, unspecified: Secondary | ICD-10-CM | POA: Diagnosis not present

## 2021-09-02 LAB — URINALYSIS, ROUTINE W REFLEX MICROSCOPIC
Bilirubin, UA: NEGATIVE
Glucose, UA: NEGATIVE
Ketones, UA: NEGATIVE
Leukocytes,UA: NEGATIVE
Nitrite, UA: NEGATIVE
Protein,UA: NEGATIVE
RBC, UA: NEGATIVE
Specific Gravity, UA: <1.005 — ABNORMAL LOW (ref 1.005–1.030)
Urobilinogen, Ur: 0.2 mg/dL (ref 0.2–1.0)
pH, UA: 6.5 (ref 5.0–7.5)

## 2021-09-02 LAB — BLADDER SCAN AMB NON-IMAGING: Scan Result: 0

## 2021-09-02 MED ORDER — DOCUSATE SODIUM 100 MG PO CAPS: 100.0000 mg | ORAL_CAPSULE | Freq: Two times a day (BID) | ORAL | 0 refills | Status: AC

## 2021-09-02 MED ORDER — ALFUZOSIN HCL ER 10 MG PO TB24: 10.0000 mg | ORAL_TABLET | Freq: Every day | ORAL | 11 refills | Status: DC

## 2021-09-02 NOTE — Progress Notes (Signed)
Pt here today for bladder scan. Bladder was scanned and 0 was visualized.    Performed by Marisue Brooklyn, CMA  Additional follow up Follow up as schedule

## 2021-09-02 NOTE — Progress Notes (Signed)
Assessment: 1. Recurrent UTI - BLADDER SCAN AMB NON-IMAGING - Urinalysis, Routine w reflex microscopic    Plan: Patient is reassured that there is no evidence of UTI and that she is emptying her bladder well.  Uroxatrol was refilled.  Discussed her symptoms of constipation and advised her to begin GI medications as prescribed for this and will add Colace as a stool softener.  If she is unable to pass a good amount of stool in the next several days, she is advised to follow-up with her gastroenterologist.  Follow-up here in 6 months for UA and PVR.  Chief Complaint: No chief complaint on file.   HPI: Ruth Terry is a 66 y.o. female who presents for evaluation of 1 week history of lower abdominal pelvic pain and pressure with low back discomfort.  She denies urinary burning, frequency, urgency, incontinence.  She has no incomplete emptying and has had no gross hematuria.  Patient has a history of significant issues with constipation and states she is passing small amounts of stool but has had worsening of stool retention over the past few weeks.  She reports quality of stool alternates between and normal consistency to very hard. Hemorrhoid discomfort has been worse as well. She was recently seen for the same and prescribed lactulose which she has not begun.  She has several prescriptions to assist with constipation, but has been taking none of them except Metamucil.  She has not tried MiraLAX as prescribed because she feels it does not work.   No fever, chills.  No UTIs since her visit 2 months ago.  Patient has upcoming cholecystectomy scheduled.  UA= clear PVR=96m  06/30/21 Ruth VARYis a 66y.o. year old female seen twice before here-last in 2021.  She has a history of voiding dysfunction, and in the past has been on alfuzosin.  She has not been on that for about 4 years.  She recently had nitrite positive urine and microscopic hematuria.  She was treated with Keflex.  Symptoms  started 7 weeks ago also included feeling of incomplete emptying, frequency and urgency.  She has gastroparesis and irritable bowel syndrome.  She has a regular problem with constipation.  Her urinary symptoms are not any better with a good bowel movement.  She has had no gross hematuria and no current dysuria. Portions of the above documentation were copied from a prior visit for review purposes only.  Allergies: Allergies  Allergen Reactions   Chlordiazepoxide-Clidinium Itching    (Librax)   Ciprofloxacin Itching    Irritates stomach   Flagyl [Metronidazole]     Severe yeast infection   Sulfa Drugs Cross Reactors Nausea Only   Nitrofurantoin Monohyd Macro Rash   Omnicef [Cefdinir] Nausea And Vomiting    PMH: Past Medical History:  Diagnosis Date   Allergy    Anal fissure    Cataract    Chronic constipation    Diverticulosis 04/20/2010   colonoscopy   Gastroparesis    GERD (gastroesophageal reflux disease)    Hemorrhoids    Hyperlipidemia    IBS (irritable bowel syndrome)    MVP (mitral valve prolapse)    Osteoporosis    Polyp, stomach 04/20/2010   egd   Shingles    UTI (lower urinary tract infection)     PSH: Past Surgical History:  Procedure Laterality Date   COLONOSCOPY     ESOPHAGOGASTRODUODENOSCOPY     INTRAVASCULAR PRESSURE WIRE/FFR STUDY N/A 07/22/2021   Procedure: INTRAVASCULAR PRESSURE WIRE/FFR STUDY;  Surgeon: Belva Crome, MD;  Location: [facility];  Service: Cardiovascular;  Laterality: N/A;   LEFT HEART CATH AND CORONARY ANGIOGRAPHY N/A 07/22/2021   Procedure: LEFT HEART CATH AND CORONARY ANGIOGRAPHY;  Surgeon: Belva Crome, MD;  Location: [facility];  Service: Cardiovascular;  Laterality: N/A;    SH: Social History   Tobacco Use   Smoking status: Some Days    Packs/day: 1.00    Years: 20.00    Total pack years: 20.00    Types: Cigarettes    Last attempt to quit: 05/2020    Years since quitting: 1.3   Smokeless tobacco:  Never   Tobacco comments:    USE SMOKELESS CIGARETTES  Vaping Use   Vaping Use: Some days   Substances: Nicotine  Substance Use Topics   Alcohol use: Not Currently    Comment: 1 a week, wine   Drug use: No    ROS: All other review of systems were reviewed and are negative except what is noted above in HPI  PE: BP 133/79   Pulse 97  GENERAL APPEARANCE:  Well appearing, well developed, well nourished, NAD HEENT:  Atraumatic, normocephalic NECK:  Supple. Trachea midline ABDOMEN:  Soft, mildly distended, non-tender, no masses EXTREMITIES:  Moves all extremities well, without clubbing, cyanosis, or edema NEUROLOGIC:  Alert and oriented x 3, normal gait, CN II-XII grossly intact MENTAL STATUS:  appropriate BACK:  Non-tender to palpation, No CVAT SKIN:  Warm, dry, and intact   Results: Laboratory Data: Lab Results  Component Value Date   WBC 5.7 07/09/2021   HGB 14.0 07/09/2021   HCT 42.3 07/09/2021   MCV 89.1 07/09/2021   PLT 201 07/09/2021    Lab Results  Component Value Date   CREATININE 0.77 07/17/2021    Lab Results  Component Value Date   HGBA1C 5.5 05/08/2018    Urinalysis    Component Value Date/Time   COLORURINE COLORLESS (A) 07/09/2021 1401   APPEARANCEUR CLEAR 07/09/2021 1401   APPEARANCEUR Clear 06/30/2021 1422   LABSPEC 1.003 (L) 07/09/2021 1401   PHURINE 7.0 07/09/2021 1401   GLUCOSEU NEGATIVE 07/09/2021 1401   GLUCOSEU NEGATIVE 10/09/2014 1544   HGBUR NEGATIVE 07/09/2021 1401   BILIRUBINUR NEGATIVE 07/09/2021 1401   BILIRUBINUR Negative 06/30/2021 1422   KETONESUR NEGATIVE 07/09/2021 1401   PROTEINUR NEGATIVE 07/09/2021 1401   UROBILINOGEN negative (A) 07/17/2019 1607   UROBILINOGEN 0.2 11/11/2014 1321   NITRITE NEGATIVE 07/09/2021 1401   LEUKOCYTESUR NEGATIVE 07/09/2021 1401    Lab Results  Component Value Date   LABMICR See below: 11/13/2020   WBCUA None seen 11/13/2020   RBCUA 3-10 (A) 01/23/2018   LABEPIT None seen 11/13/2020    BACTERIA None seen 11/13/2020    Pertinent Imaging: Results for orders placed in visit on 08/15/20  DG Abd 1 View  Narrative CLINICAL DATA:  Abdominal pain.  EXAM: ABDOMEN - 1 VIEW  COMPARISON:  None.  FINDINGS: Bowel gas pattern is nonobstructive. No free peritoneal air. Mild diffuse decreased bone mineralization. Mild symmetric degenerative changes of the hips. Mild degenerative change of the spine.  IMPRESSION: Nonobstructive bowel gas pattern.   Electronically Signed By: Marin Olp M.D. On: 08/17/2020 10:17  No results found for this or any previous visit.  No results found for this or any previous visit.  No results found for this or any previous visit.  No results found for this or any previous visit.  No results found for this or any  previous visit.  No results found for this or any previous visit.  Results for orders placed during the hospital encounter of 11/08/20  CT Renal Stone Study  Narrative CLINICAL DATA:  Bilateral flank pain for 5 days. Kidney stone suspected. Patient recently diagnosed with UTI.  EXAM: CT ABDOMEN AND PELVIS WITHOUT CONTRAST  TECHNIQUE: Multidetector CT imaging of the abdomen and pelvis was performed following the standard protocol without IV contrast.  COMPARISON:  September 05, 2020  FINDINGS: Lower chest: 4 mm nodule in the right lower lobe adjacent to the fissure on series 4, image 6. This region was not included on previous studies. No other abnormalities in the lung bases.  Hepatobiliary: No focal liver abnormality is seen. No gallstones, gallbladder wall thickening, or biliary dilatation.  Pancreas: Unremarkable. No pancreatic ductal dilatation or surrounding inflammatory changes.  Spleen: Normal in size without focal abnormality.  Adrenals/Urinary Tract: Adrenal glands are unremarkable. Kidneys are normal, without renal calculi, focal lesion, or hydronephrosis. Bladder is  unremarkable.  Stomach/Bowel: The wall of the gastric fundus, cardia, and proximal body appear somewhat thickened. The remainder of the stomach is normal. Small bowel is normal. And appendix are unremarkable.  Vascular/Lymphatic: Calcified atherosclerosis in the nonaneurysmal aorta. No adenopathy.  Reproductive: Uterus and bilateral adnexa are unremarkable.  Other: No free air free fluid. A fat containing umbilical hernia is identified.  Musculoskeletal: No acute or significant osseous findings.  IMPRESSION: 1. 4 mm nodule in the right lower lobe adjacent to the fissure on series 4, image 6. This region was not included on previous studies. No follow-up needed if patient is low-risk. Non-contrast chest CT can be considered in 12 months if patient is high-risk. This recommendation follows the consensus statement: Guidelines for Management of Incidental Pulmonary Nodules Detected on CT Images: From the Fleischner Society 2017; Radiology 2017; 284:228-243. 2. The wall of the gastric fundus, cardia, and proximal body appear thickened. While this could be due to lack of distention, the finding is similar since previous studies raising the possibility of tree thickening. Endoscopy or upper GI could better evaluate. Recommend clinical correlation. 3. Calcified atherosclerosis in the nonaneurysmal abdominal aorta. 4. Fat containing umbilical hernia. 5. No other abnormalities. Specifically, no renal stones or obstruction.   Electronically Signed By: Dorise Bullion III M.D. On: 11/08/2020 17:35  Results for orders placed or performed in visit on 09/02/21 (from the past 24 hour(s))  BLADDER SCAN AMB NON-IMAGING   Collection Time: 09/02/21  2:14 PM  Result Value Ref Range   Scan Result 0

## 2021-09-04 ENCOUNTER — Ambulatory Visit: Payer: BC Managed Care – PPO

## 2021-09-14 DIAGNOSIS — H10013 Acute follicular conjunctivitis, bilateral: Secondary | ICD-10-CM | POA: Diagnosis not present

## 2021-09-15 DIAGNOSIS — J020 Streptococcal pharyngitis: Secondary | ICD-10-CM | POA: Diagnosis not present

## 2021-09-15 DIAGNOSIS — J029 Acute pharyngitis, unspecified: Secondary | ICD-10-CM | POA: Diagnosis not present

## 2021-09-15 DIAGNOSIS — R0981 Nasal congestion: Secondary | ICD-10-CM | POA: Diagnosis not present

## 2021-09-15 NOTE — Pre-Procedure Instructions (Signed)
Surgical Instructions    Your procedure is scheduled on Monday September 18.  Report to [facility] Main Entrance "A" at 8:15 A.M., then check in with the Admitting office.  Call this number if you have problems the morning of surgery:  [phone]   If you have any questions prior to your surgery date call [phone]: Open Monday-Friday 8am-4pm    Remember:  Do not eat after midnight the night before your surgery  You may drink clear liquids until 7:15AM the morning of your surgery.   Clear liquids allowed are: Water, Non-Citrus Juices (without pulp), Carbonated Beverages, Clear Tea, Black Coffee ONLY (NO MILK, CREAM OR POWDERED CREAMER of any kind), and Gatorade    Take these medicines the morning of surgery with A SIP OF WATER:  alfuzosin (UROXATRAL) ezetimibe (ZETIA) famotidine (PEPCID)  pantoprazole (PROTONIX) HYDROcodone-acetaminophen (NORCO/VICODIN) if needed levocetirizine (XYZAL) if needed ondansetron [facility]) if needed  Follow your surgeon's instructions on when to stop Aspirin.  If no instructions were given by your surgeon then you will need to call the office to get those instructions.    As of today, STOP taking any Aleve, Naproxen, Ibuprofen, Motrin, Advil, Goody's, BC's, all herbal medications, fish oil, and all vitamins.          [facility] is not responsible for any belongings or valuables.    Do NOT Smoke (Tobacco/Vaping)  24 hours prior to your procedure  If you use a CPAP at night, you may bring your mask for your overnight stay.   Contacts, glasses, hearing aids, dentures or partials may not be worn into surgery, please bring cases for these belongings   For patients admitted to the hospital, discharge time will be determined by your treatment team.   Patients discharged the day of surgery will not be allowed to drive home, and someone needs to stay with them for 24 hours.   SURGICAL WAITING ROOM VISITATION Patients having surgery or a procedure  may have no more than 2 support people in the waiting area - these visitors may rotate.   Children under the age of 38 must have an adult with them who is not the patient. If the patient needs to stay at the hospital during part of their recovery, the visitor guidelines for inpatient rooms apply. Pre-op nurse will coordinate an appropriate time for 1 support person to accompany patient in pre-op.  This support person may not rotate.   Please refer to the [facility] website for the visitor guidelines for Inpatients (after your surgery is over and you are in a regular room).    Special instructions:    Oral Hygiene is also important to reduce your risk of infection.  Remember - BRUSH YOUR TEETH THE MORNING OF SURGERY WITH YOUR REGULAR TOOTHPASTE   [place]- Preparing For Surgery  Before surgery, you can play an important role. Because skin is not sterile, your skin needs to be as free of germs as possible. You can reduce the number of germs on your skin by washing with CHG (chlorahexidine gluconate) Soap before surgery.  CHG is an antiseptic cleaner which kills germs and bonds with the skin to continue killing germs even after washing.     Please do not use if you have an allergy to CHG or antibacterial soaps. If your skin becomes reddened/irritated stop using the CHG.  Do not shave (including legs and underarms) for at least 48 hours prior to first CHG shower. It is OK to shave your face.  Please follow these instructions carefully.     Shower the NIGHT BEFORE SURGERY and the MORNING OF SURGERY with CHG Soap.   If you chose to wash your hair, wash your hair first as usual with your normal shampoo. After you shampoo, rinse your hair and body thoroughly to remove the shampoo.  Then [employer] and genitals (private parts) with your normal soap and rinse thoroughly to remove soap.  After that Use CHG Soap as you would any other liquid soap. You can apply CHG directly to the skin and wash  gently with a scrungie or a clean washcloth.   Apply the CHG Soap to your body ONLY FROM THE NECK DOWN.  Do not use on open wounds or open sores. Avoid contact with your eyes, ears, mouth and genitals (private parts). Wash Face and genitals (private parts)  with your normal soap.   Wash thoroughly, paying special attention to the area where your surgery will be performed.  Thoroughly rinse your body with warm water from the neck down.  DO NOT shower/wash with your normal soap after using and rinsing off the CHG Soap.  Pat yourself dry with a CLEAN TOWEL.  Wear CLEAN PAJAMAS to bed the night before surgery  Place CLEAN SHEETS on your bed the night before your surgery  DO NOT SLEEP WITH PETS.   Day of Surgery:  Take a shower with CHG soap. Wear Clean/Comfortable clothing the morning of surgery Do not wear jewelry or makeup. Do not wear lotions, powders, perfumes/cologne or deodorant. Do not shave 48 hours prior to surgery.  Men may shave face and neck. Do not bring valuables to the hospital. Do not wear nail polish, gel polish, artificial nails, or any other type of covering on natural nails (fingers and toes) If you have artificial nails or gel coating that need to be removed by a nail salon, please have this removed prior to surgery. Artificial nails or gel coating may interfere with anesthesia's ability to adequately monitor your vital signs. Remember to brush your teeth WITH YOUR REGULAR TOOTHPASTE.    If you received a COVID test during your pre-op visit, it is requested that you wear a mask when out in public, stay away from anyone that may not be feeling well, and notify your surgeon if you develop symptoms. If you have been in contact with anyone that has tested positive in the last 10 days, please notify your surgeon.    Please read over the following fact sheets that you were given.

## 2021-09-16 ENCOUNTER — Encounter (HOSPITAL_COMMUNITY)
Admission: RE | Admit: 2021-09-16 | Discharge: 2021-09-16 | Disposition: A | Discharge status: Home / Self Care | Payer: BC Managed Care – PPO | Source: Ambulatory Visit | Attending: Surgery | Admitting: Surgery

## 2021-09-16 ENCOUNTER — Encounter (HOSPITAL_COMMUNITY): Payer: Self-pay

## 2021-09-16 ENCOUNTER — Other Ambulatory Visit: Payer: Self-pay

## 2021-09-16 VITALS — BP 115/81 | HR 85 | Temp 98.4°F | Resp 16 | Ht 60.0 in | Wt 101.2 lb

## 2021-09-16 DIAGNOSIS — Z87891 Personal history of nicotine dependence: Secondary | ICD-10-CM | POA: Diagnosis not present

## 2021-09-16 DIAGNOSIS — I2510 Atherosclerotic heart disease of native coronary artery without angina pectoris: Secondary | ICD-10-CM | POA: Insufficient documentation

## 2021-09-16 DIAGNOSIS — Z8616 Personal history of COVID-19: Secondary | ICD-10-CM | POA: Insufficient documentation

## 2021-09-16 DIAGNOSIS — K219 Gastro-esophageal reflux disease without esophagitis: Secondary | ICD-10-CM | POA: Diagnosis not present

## 2021-09-16 DIAGNOSIS — Z01812 Encounter for preprocedural laboratory examination: Secondary | ICD-10-CM | POA: Diagnosis not present

## 2021-09-16 DIAGNOSIS — K8020 Calculus of gallbladder without cholecystitis without obstruction: Secondary | ICD-10-CM | POA: Insufficient documentation

## 2021-09-16 DIAGNOSIS — E785 Hyperlipidemia, unspecified: Secondary | ICD-10-CM | POA: Insufficient documentation

## 2021-09-16 HISTORY — DX: Atherosclerotic heart disease of native coronary artery without angina pectoris: I25.10

## 2021-09-16 LAB — BASIC METABOLIC PANEL
Anion gap: 9 (ref 5–15)
BUN: <5 mg/dL — ABNORMAL LOW (ref 8–23)
CO2: 24 mmol/L (ref 22–32)
Calcium: 9.2 mg/dL (ref 8.9–10.3)
Chloride: 108 mmol/L (ref 98–111)
Creatinine, Ser: 0.71 mg/dL (ref 0.44–1.00)
GFR, Estimated: >60 mL/min (ref >60)
Glucose, Bld: 79 mg/dL (ref 70–99)
Potassium: 3.4 mmol/L — ABNORMAL LOW (ref 3.5–5.1)
Sodium: 141 mmol/L (ref 135–145)

## 2021-09-16 LAB — CBC
HCT: 39.8 % (ref 36.0–46.0)
Hemoglobin: 12.6 g/dL (ref 12.0–15.0)
MCH: 29.1 pg (ref 26.0–34.0)
MCHC: 31.7 g/dL (ref 30.0–36.0)
MCV: 91.9 fL (ref 80.0–100.0)
Platelets: 162 10*3/uL (ref 150–400)
RBC: 4.33 MIL/uL (ref 3.87–5.11)
RDW: 13.2 % (ref 11.5–15.5)
WBC: 4.5 10*3/uL (ref 4.0–10.5)
nRBC: 0 % (ref 0.0–0.2)

## 2021-09-16 NOTE — Progress Notes (Addendum)
PCP - Adaline Sill, NP Cardiologist - Daneen Schick, MD  PPM/ICD - denies Chest x-ray - 07/09/21 EKG - 07/23/21 Stress Test - denies ECHO - 09/18/10 Cardiac Cath - 07/22/21  Sleep Study - denies  Aspirin Instructions: Per cardiologist note pt can stop aspirin 7 days prior to surgery. Pt states she has not been given any instructions on Aspirin. Pt instructed to call surgeon's office to see if she need to stop aspirin.   ERAS Protcol - ERAS no drink ordered   COVID TEST- N/A- pt was seen by PCP yesterday- she states she had some sinus pain, but no other symptoms. She wanted to get tests performed as she is coming for surgery soon. Pt states she was negative for COVID and FLU at PCP's office. Pt states she has no symptoms today and will notify surgeon if she starts having COVID symptoms.   Anesthesia review: yes- review cardiac tests. Pt does have cardiac clearance note from cardiology on 09/01/21. Pt states that she saw Dr Tamala Julian due to chest pain Pt had Coronary CT and heart cath done in July 2023. Pt states she only has this chest pain when she "over exerts" herself. Pt states she has been taking her prescribed medications and increasing her physical activity per Dr Thompson Caul instructions.   Patient denies shortness of breath, fever, cough and chest pain at PAT appointment   All instructions explained to the patient, with a verbal understanding of the material. Patient agrees to go over the instructions while at home for a better understanding. The opportunity to ask questions was provided.

## 2021-09-17 ENCOUNTER — Encounter (HOSPITAL_COMMUNITY): Payer: Self-pay

## 2021-09-17 NOTE — Progress Notes (Signed)
Anesthesia Chart Review:  Case: [id number] Date/Time: 09/21/21 1000   Procedure: LAPAROSCOPIC CHOLECYSTECTOMY   Anesthesia type: General   Pre-op diagnosis: GALLSTONES   Location: [place] OR ROOM 09 / [place] OR   Surgeons: Dwan Bolt, MD       DISCUSSION: Patient is a 66 year old female scheduled for the above procedure.  History includes smoking, CAD (LHC 07/22/21 for exertional CP, 50-70% RCA, medical therapy), MVP (diagnosed as teenager), HLD, GERD, gastroparesis, IBS (with constipation), osteoporosis, cholecystitis (07/2021), recurrent UTIs.  She had cardiac work-up in 2012 with Dr. Kirk Ruths for chest pain. She was referred back to cardiology more recently by pulmonology after she was seen for DOE post-COVID, but also reported some exertional chest pain. She was evaluated by Dr. Daneen Schick on 06/17/21, and he ordered an Coronary CT which was done on 07/15/21 and showed coronary calcium score of 186 (89th percentile), mixed plaque in the mid RCA estimated at > 70%. She was referred for a LHC which was done on 07/22/21 and showed: Mid eccentric 50 to 70% RCA stenosis with RFR 0.91 (significant if </=.89). Other arteries are widely patent. Normal LV function.  EDP 8 mmHg Dr. Tamala Julian added, "Overall, moderate mid RCA disease.  Plan aggressive risk factor modification including LDL less than 70.  Heparin daily.  Chest discomfort can last hours and is felt to be unlikely related to ischemia." He did ultimately recommend referral to the [facility] due to intolerance of rosuvastatin. She had APP cardiology follow-up with Leanor Kail, PA and referred to cardiac rehab. He wrote, "Surgical clearance -Patient likely has upcoming gallbladder removal and cataract surgery.  Given cath report she will be cleared at acceptable risk.  She may hold her aspirin if required."    She was also diagnosed with acute cholecystitis on 07/09/21 and out-patient cholecystectomy recommended. As above, preoperative  cardiology recommendations addressed at her 08/10/21 visit. She had general surgery evaluation by Dr. Zenia Resides on 08/17/21.  I called and spoke with her to confirm no new symptoms since she was cleared by cardiology last month. She denied any new or progressive symptoms. She did report treatment for Strep pharyngitis 3 days days ago. S/p azithromycin x 3 days (last 09/17/21). She said her sore throat has resolved and feels fine. Denied SOB or any significant or persistent coughing. She said she is still smoking, but says she has cut down by about 70% over the last several months.   Anesthesia team to evaluate on the day of surgery.   VS: BP 115/81   Pulse 85   Temp 36.9 C (Oral)   Resp 16   Ht 5' (1.524 m)   Wt 45.9 kg   SpO2 100%   BMI 19.76 kg/m    PROVIDERS: Adaline Sill, NP is PCP  Daneen Schick, MD is cardiologist Scarlette Shorts, MD is GI Christinia Gully, MD is pulmonologist. Evaluated on 05/12/21 for [facility] since COVID-19 02/2020. Had recently quit smoking then. She was referred for lung cancer screening imaging and also to cardiology for exertional chest pain.  Franchot Gallo, MD is urologist   LABS: Labs reviewed: Acceptable for surgery. (all labs ordered are listed, but only abnormal results are displayed)  Labs Reviewed  BASIC METABOLIC PANEL - Abnormal; Notable for the following components:      Result Value   Potassium 3.4 (*)    BUN <5 (*)    All other components within normal limits  CBC    OTHER: Walk Test 05/12/21:  "  Walked on RA  x  3  lap(s) =  approx 450  ft  @ moderate pace, stopped due to end of study  with lowest 02 sats 96% and sob on 2nd lap, some chest discomfort with ex but the reported it was present before exerted as well (this was not the original hx)" - Dr. Melvyn Novas referred her to cardiology due to chest pain.    IMAGES: CT Chest (over-read) 07/15/21: FINDINGS: - Extracardiac Vascular: Descending thoracic aortic atherosclerotic calcification. -  Mediastinum: Small regional lymph nodes are not pathologically enlarged. - Lung: Dependent subsegmental atelectasis in the lower lobes. - Included Upper Abdomen: Unremarkable - Musculoskeletal: Unremarkable IMPRESSION: 1.  Aortic Atherosclerosis (ICD10-I70.0).   [place] Abd 07/10/21: IMPRESSION: 1. Gallbladder wall thickening and positive sonographic Murphy sign, findings are consistent with acute cholecystitis. 2. Mobile echogenic foci which may be due to nonshadowing stones or sludge. 3. Mildly increased hepatic parenchymal echogenicity, findings can be seen in the setting of hepatic steatosis.   CT Abd/pelvis 07/09/21: IMPRESSION: 1. Findings suspicious for acute cholecystitis. Recommend right upper quadrant ultrasound for further evaluation. 2. Small to moderate colonic stool burden. Aortic Atherosclerosis (ICD10-I70.0).  CXR 07/09/21: IMPRESSION: 1. No acute cardiopulmonary disease. 2. COPD.     EKG: 07/22/21: NSR   CV: LHC 07/22/21 (due to abnormal CCTA): Widely patent left main Proximal calcified LAD 20% stenosis. Widely patent circumflex with proximal to mid 30% plaque. RCA with 30% proximal and 50 to 70% mid within an angulation.  RFR beyond the mid stenosis was 0.91 (abnormal </= 0.89). Normal left ventricular function with EF 65%.  EDP is normal.   RECOMMENDATIONS: Smoking [sic].  LDL less than 55.  Has only been on statins for 7 days and is complaining of leg discomfort.  Would decrease to Monday, Wednesday, and Friday dosing (10 mg tablets). Exercise and consider cardiac rehab.   CT Coronary 07/15/21: IMPRESSION: 1. Coronary calcium score of 186. This was 89th percentile for age-, race-, and sex-matched controls. 2. Normal coronary origin with right dominance. 3. There appears to be severe (>70%) mixed plaque in the mid RCA. This is just proximal to a region of slab reconstruction artifact. Unable to send for FFRct due to artifact. Recommend functional imaging or  coronary angiography to futher investigate. Otherwise non-obstructive CAD. CAD- RADS 4. 4.  Aortic atherosclerosis.   Past Medical History:  Diagnosis Date   Allergy    Anal fissure    Cataract    Chronic constipation    Coronary artery disease    Diverticulosis 04/20/2010   colonoscopy   Gastroparesis    GERD (gastroesophageal reflux disease)    Hemorrhoids    Hyperlipidemia    IBS (irritable bowel syndrome)    MVP (mitral valve prolapse)    as a teenager per patient, no issues since   Osteoporosis    Polyp, stomach 04/20/2010   egd   Shingles    UTI (lower urinary tract infection)     Past Surgical History:  Procedure Laterality Date   COLONOSCOPY     ESOPHAGOGASTRODUODENOSCOPY     INTRAVASCULAR PRESSURE WIRE/FFR STUDY N/A 07/22/2021   Procedure: INTRAVASCULAR PRESSURE WIRE/FFR STUDY;  Surgeon: Belva Crome, MD;  Location: [place] [facility];  Service: Cardiovascular;  Laterality: N/A;   LEFT HEART CATH AND CORONARY ANGIOGRAPHY N/A 07/22/2021   Procedure: LEFT HEART CATH AND CORONARY ANGIOGRAPHY;  Surgeon: Belva Crome, MD;  Location: [place] [facility];  Service: Cardiovascular;  Laterality: N/A;  MEDICATIONS:  alfuzosin (UROXATRAL) 10 MG 24 hr tablet   aspirin EC 81 MG tablet   Cholecalciferol (VITAMIN D3 PO)   docusate sodium (COLACE) 100 MG capsule   erythromycin ophthalmic ointment   ezetimibe (ZETIA) 10 MG tablet   famotidine (PEPCID) 20 MG tablet   fluticasone (FLONASE) 50 MCG/ACT nasal spray   HYDROcodone-acetaminophen (NORCO/VICODIN) 5-325 MG tablet   hyoscyamine (LEVSIN SL) 0.125 MG SL tablet   levocetirizine (XYZAL) 5 MG tablet   linaclotide (LINZESS) 145 MCG CAPS capsule   nitroGLYCERIN (NITROSTAT) 0.4 MG SL tablet   ofloxacin (OCUFLOX) 0.3 % ophthalmic solution   ondansetron (ZOFRAN) 4 MG tablet   pantoprazole (PROTONIX) 40 MG tablet   psyllium (METAMUCIL) 58.6 % packet   Simethicone (PHAZYME MAXIMUM STRENGTH) 250 MG CAPS   valACYclovir  (VALTREX) 1000 MG tablet    methylPREDNISolone acetate (DEPO-MEDROL) injection 40 mg    Myra Gianotti, PA-C Surgical Short Stay/Anesthesiology [facility] Phone [phone] [facility] Phone [phone] 09/17/2021 12:00 PM

## 2021-09-17 NOTE — Anesthesia Preprocedure Evaluation (Signed)
Anesthesia Evaluation  Patient identified by MRN, date of birth, ID band Patient awake    Reviewed: Allergy & Precautions, NPO status , Patient's Chart, lab work & pertinent test results  Airway Mallampati: II  TM Distance: >3 FB Neck ROM: Full    Dental  (+) Partial Lower, Partial Upper, Missing   Pulmonary COPD, Current Smoker and Patient abstained from smoking.,    Pulmonary exam normal breath sounds clear to auscultation       Cardiovascular + CAD  Normal cardiovascular exam Rhythm:Regular Rate:Normal  LHC 07/22/21 (due to abnormal CCTA): Marland Kitchen Widely patent left main . Proximal calcified LAD 20% stenosis. . Widely patent circumflex with proximal to mid 30% plaque. Marland Kitchen RCA with 30% proximal and 50 to 70% mid within an angulation. RFR beyond the mid stenosis was 0.91 (abnormal </= 0.89). . Normal left ventricular function with EF 65%. EDP is normal.   Neuro/Psych negative neurological ROS  negative psych ROS   GI/Hepatic Neg liver ROS, GERD  ,  Endo/Other  negative endocrine ROS  Renal/GU negative Renal ROS  negative genitourinary   Musculoskeletal negative musculoskeletal ROS (+)   Abdominal   Peds negative pediatric ROS (+)  Hematology negative hematology ROS (+)   Anesthesia Other Findings   Reproductive/Obstetrics negative OB ROS                           Anesthesia Physical Anesthesia Plan  ASA: 3  Anesthesia Plan: General   Post-op Pain Management: Minimal or no pain anticipated   Induction: Intravenous  PONV Risk Score and Plan: 2 and Ondansetron, Dexamethasone and Treatment may vary due to age or medical condition  Airway Management Planned: Oral ETT  Additional Equipment:   Intra-op Plan:   Post-operative Plan: Extubation in OR  Informed Consent: I have reviewed the patients History and Physical, chart, labs and discussed the procedure including the risks, benefits  and alternatives for the proposed anesthesia with the patient or authorized representative who has indicated his/her understanding and acceptance.     Dental advisory given  Plan Discussed with: CRNA and Surgeon  Anesthesia Plan Comments: (PAT note written 09/17/2021 by Myra Gianotti, PA-C.  Following 07/22/21 LHC, Dr. Tamala Julian wrote, "Overall, moderate mid RCA disease. Plan aggressive risk factor modification including LDL less than 70. Heparin daily. Chest discomfort can last hours and is felt to be unlikely related to ischemia."  08/10/21 f/u and preoperative cardiology APP evaluation by Leanor Kail, PA: "Surgical clearance -Patient likely has upcoming gallbladder removal and cataract surgery. Given cath report she will be cleared at acceptable risk. She may hold her aspirin if required."  )       Anesthesia Quick Evaluation

## 2021-09-19 DIAGNOSIS — H10013 Acute follicular conjunctivitis, bilateral: Secondary | ICD-10-CM | POA: Diagnosis not present

## 2021-09-21 ENCOUNTER — Ambulatory Visit (HOSPITAL_COMMUNITY): Payer: BC Managed Care – PPO | Admitting: Vascular Surgery

## 2021-09-21 ENCOUNTER — Other Ambulatory Visit: Payer: Self-pay

## 2021-09-21 ENCOUNTER — Encounter (HOSPITAL_COMMUNITY)
Admission: RE | Disposition: A | Discharge status: Home / Self Care | Payer: Self-pay | Source: Home / Self Care | Attending: Surgery

## 2021-09-21 ENCOUNTER — Ambulatory Visit (HOSPITAL_COMMUNITY)
Admission: RE | Admit: 2021-09-21 | Discharge: 2021-09-21 | Disposition: A | Discharge status: Home / Self Care | Payer: BC Managed Care – PPO | Attending: Surgery | Admitting: Surgery

## 2021-09-21 ENCOUNTER — Encounter (HOSPITAL_COMMUNITY): Payer: Self-pay | Admitting: Surgery

## 2021-09-21 ENCOUNTER — Ambulatory Visit (HOSPITAL_COMMUNITY): Payer: BC Managed Care – PPO | Admitting: Certified Registered Nurse Anesthetist

## 2021-09-21 DIAGNOSIS — F17210 Nicotine dependence, cigarettes, uncomplicated: Secondary | ICD-10-CM | POA: Insufficient documentation

## 2021-09-21 DIAGNOSIS — K8020 Calculus of gallbladder without cholecystitis without obstruction: Secondary | ICD-10-CM | POA: Diagnosis not present

## 2021-09-21 DIAGNOSIS — K811 Chronic cholecystitis: Secondary | ICD-10-CM | POA: Diagnosis not present

## 2021-09-21 DIAGNOSIS — K8010 Calculus of gallbladder with chronic cholecystitis without obstruction: Secondary | ICD-10-CM | POA: Diagnosis not present

## 2021-09-21 DIAGNOSIS — J449 Chronic obstructive pulmonary disease, unspecified: Secondary | ICD-10-CM | POA: Insufficient documentation

## 2021-09-21 DIAGNOSIS — I2510 Atherosclerotic heart disease of native coronary artery without angina pectoris: Secondary | ICD-10-CM | POA: Diagnosis not present

## 2021-09-21 DIAGNOSIS — K219 Gastro-esophageal reflux disease without esophagitis: Secondary | ICD-10-CM | POA: Diagnosis not present

## 2021-09-21 HISTORY — PX: CHOLECYSTECTOMY: SHX55

## 2021-09-21 SURGERY — LAPAROSCOPIC CHOLECYSTECTOMY
Anesthesia: General | Site: Abdomen

## 2021-09-21 MED ORDER — ONDANSETRON HCL 4 MG/2ML IJ SOLN: 4.0000 mg | Freq: Once | INTRAMUSCULAR | Status: DC | PRN

## 2021-09-21 MED ORDER — HYDROCODONE-ACETAMINOPHEN 5-325 MG PO TABS: 1.0000 | ORAL_TABLET | Freq: Four times a day (QID) | ORAL | 0 refills | Status: AC | PRN

## 2021-09-21 MED ORDER — LIDOCAINE 2% (20 MG/ML) 5 ML SYRINGE
INTRAMUSCULAR | Status: AC
  Filled 2021-09-21: qty 5

## 2021-09-21 MED ORDER — CEFAZOLIN SODIUM-DEXTROSE 2-4 GM/100ML-% IV SOLN
2.0000 g | INTRAVENOUS | Status: AC
  Administered 2021-09-21: 2 g via INTRAVENOUS

## 2021-09-21 MED ORDER — CHLORHEXIDINE GLUCONATE 0.12 % MT SOLN: 15.0000 mL | Freq: Once | OROMUCOSAL | Status: AC

## 2021-09-21 MED ORDER — FENTANYL CITRATE (PF) 100 MCG/2ML IJ SOLN
25.0000 ug | INTRAMUSCULAR | Status: DC | PRN
  Administered 2021-09-21 (×2): 50 ug via INTRAVENOUS

## 2021-09-21 MED ORDER — GABAPENTIN 300 MG PO CAPS
ORAL_CAPSULE | ORAL | Status: AC
  Administered 2021-09-21: 300 mg via ORAL
  Filled 2021-09-21: qty 1

## 2021-09-21 MED ORDER — SUGAMMADEX SODIUM 200 MG/2ML IV SOLN
INTRAVENOUS | Status: DC | PRN
  Administered 2021-09-21: 190.4 mg via INTRAVENOUS

## 2021-09-21 MED ORDER — ORAL CARE MOUTH RINSE: 15.0000 mL | Freq: Once | OROMUCOSAL | Status: AC

## 2021-09-21 MED ORDER — ROCURONIUM BROMIDE 10 MG/ML (PF) SYRINGE
PREFILLED_SYRINGE | INTRAVENOUS | Status: AC
  Filled 2021-09-21: qty 10

## 2021-09-21 MED ORDER — CEFAZOLIN SODIUM-DEXTROSE 2-4 GM/100ML-% IV SOLN
INTRAVENOUS | Status: AC
  Filled 2021-09-21: qty 100

## 2021-09-21 MED ORDER — FENTANYL CITRATE (PF) 250 MCG/5ML IJ SOLN
INTRAMUSCULAR | Status: DC | PRN
  Administered 2021-09-21 (×3): 50 ug via INTRAVENOUS

## 2021-09-21 MED ORDER — BUPIVACAINE-EPINEPHRINE 0.25% -1:200000 IJ SOLN
INTRAMUSCULAR | Status: DC | PRN
  Administered 2021-09-21: 30 mL

## 2021-09-21 MED ORDER — CHLORHEXIDINE GLUCONATE 0.12 % MT SOLN
OROMUCOSAL | Status: AC
  Administered 2021-09-21: 15 mL via OROMUCOSAL
  Filled 2021-09-21: qty 15

## 2021-09-21 MED ORDER — PROPOFOL 10 MG/ML IV BOLUS
INTRAVENOUS | Status: AC
  Filled 2021-09-21: qty 20

## 2021-09-21 MED ORDER — FENTANYL CITRATE (PF) 100 MCG/2ML IJ SOLN
INTRAMUSCULAR | Status: AC
  Filled 2021-09-21: qty 2

## 2021-09-21 MED ORDER — ACETAMINOPHEN 500 MG PO TABS
ORAL_TABLET | ORAL | Status: AC
  Administered 2021-09-21: 1000 mg via ORAL
  Filled 2021-09-21: qty 2

## 2021-09-21 MED ORDER — BUPIVACAINE-EPINEPHRINE (PF) 0.25% -1:200000 IJ SOLN
INTRAMUSCULAR | Status: AC
  Filled 2021-09-21: qty 30

## 2021-09-21 MED ORDER — 0.9 % SODIUM CHLORIDE (POUR BTL) OPTIME
TOPICAL | Status: DC | PRN
  Administered 2021-09-21: 1000 mL

## 2021-09-21 MED ORDER — FENTANYL CITRATE (PF) 250 MCG/5ML IJ SOLN
INTRAMUSCULAR | Status: AC
  Filled 2021-09-21: qty 5

## 2021-09-21 MED ORDER — DEXAMETHASONE SODIUM PHOSPHATE 10 MG/ML IJ SOLN
INTRAMUSCULAR | Status: DC | PRN
  Administered 2021-09-21: 10 mg via INTRAVENOUS

## 2021-09-21 MED ORDER — ACETAMINOPHEN 500 MG PO TABS: 1000.0000 mg | ORAL_TABLET | ORAL | Status: AC

## 2021-09-21 MED ORDER — OXYCODONE HCL 5 MG/5ML PO SOLN: 5.0000 mg | Freq: Once | ORAL | Status: DC | PRN

## 2021-09-21 MED ORDER — PROPOFOL 10 MG/ML IV BOLUS
INTRAVENOUS | Status: DC | PRN
  Administered 2021-09-21: 120 mg via INTRAVENOUS

## 2021-09-21 MED ORDER — LACTATED RINGERS IV SOLN: INTRAVENOUS | Status: DC

## 2021-09-21 MED ORDER — EPHEDRINE SULFATE-NACL 50-0.9 MG/10ML-% IV SOSY
PREFILLED_SYRINGE | INTRAVENOUS | Status: DC | PRN
  Administered 2021-09-21: 5 mg via INTRAVENOUS

## 2021-09-21 MED ORDER — DEXAMETHASONE SODIUM PHOSPHATE 10 MG/ML IJ SOLN
INTRAMUSCULAR | Status: AC
  Filled 2021-09-21: qty 1

## 2021-09-21 MED ORDER — ONDANSETRON HCL 4 MG/2ML IJ SOLN
INTRAMUSCULAR | Status: DC | PRN
  Administered 2021-09-21: 4 mg via INTRAVENOUS

## 2021-09-21 MED ORDER — SODIUM CHLORIDE 0.9 % IR SOLN
Status: DC | PRN
  Administered 2021-09-21: 1000 mL

## 2021-09-21 MED ORDER — KETOROLAC TROMETHAMINE 15 MG/ML IJ SOLN: 15.0000 mg | Freq: Once | INTRAMUSCULAR | Status: DC | PRN

## 2021-09-21 MED ORDER — GABAPENTIN 300 MG PO CAPS: 300.0000 mg | ORAL_CAPSULE | ORAL | Status: AC

## 2021-09-21 MED ORDER — OXYCODONE HCL 5 MG PO TABS: 5.0000 mg | ORAL_TABLET | Freq: Once | ORAL | Status: DC | PRN

## 2021-09-21 MED ORDER — EPHEDRINE 5 MG/ML INJ
INTRAVENOUS | Status: AC
  Filled 2021-09-21: qty 5

## 2021-09-21 MED ORDER — ROCURONIUM BROMIDE 10 MG/ML (PF) SYRINGE
PREFILLED_SYRINGE | INTRAVENOUS | Status: DC | PRN
  Administered 2021-09-21: 60 mg via INTRAVENOUS

## 2021-09-21 MED ORDER — LIDOCAINE 2% (20 MG/ML) 5 ML SYRINGE
INTRAMUSCULAR | Status: DC | PRN
  Administered 2021-09-21: 100 mg via INTRAVENOUS

## 2021-09-21 MED ORDER — ONDANSETRON HCL 4 MG/2ML IJ SOLN
INTRAMUSCULAR | Status: AC
  Filled 2021-09-21: qty 2

## 2021-09-21 SURGICAL SUPPLY — 46 items
ADH SKN CLS APL DERMABOND .7 (GAUZE/BANDAGES/DRESSINGS) ×1
APL PRP STRL LF DISP 70% ISPRP (MISCELLANEOUS) ×1
APPLIER CLIP 5 13 M/L LIGAMAX5 (MISCELLANEOUS) ×1
APR CLP MED LRG 5 ANG JAW (MISCELLANEOUS) ×1
BAG COUNTER SPONGE SURGICOUNT (BAG) ×1 IMPLANT
BAG SPEC RTRVL LRG 6X4 10 (ENDOMECHANICALS) ×1
BAG SPNG CNTER NS LX DISP (BAG) ×1
CANISTER SUCT 3000ML PPV (MISCELLANEOUS) ×2 IMPLANT
CHLORAPREP W/TINT 26 (MISCELLANEOUS) ×1 IMPLANT
CLIP APPLIE 5 13 M/L LIGAMAX5 (MISCELLANEOUS) ×2 IMPLANT
CNTNR URN SCR LID CUP LEK RST (MISCELLANEOUS) IMPLANT
CONT SPEC 4OZ STRL OR WHT (MISCELLANEOUS) ×1
COVER SURGICAL LIGHT HANDLE (MISCELLANEOUS) ×1 IMPLANT
DERMABOND IMPLANT
DERMABOND ADVANCED .7 DNX12 (GAUZE/BANDAGES/DRESSINGS) ×1 IMPLANT
ELECT REM PT RETURN 9FT ADLT (ELECTROSURGICAL) ×1
ELECTRODE REM PT RTRN 9FT ADLT (ELECTROSURGICAL) ×1 IMPLANT
GLOVE BIOGEL PI IND STRL 6 (GLOVE) ×1 IMPLANT
GLOVE BIOGEL PI MICRO STRL 5.5 (GLOVE) ×1 IMPLANT
GOWN STRL REUS W/ TWL LRG LVL3 (GOWN DISPOSABLE) ×6 IMPLANT
GOWN STRL REUS W/TWL LRG LVL3 (GOWN DISPOSABLE) ×3
KIT BASIN OR (CUSTOM PROCEDURE TRAY) ×1 IMPLANT
KIT TURNOVER KIT B (KITS) ×1 IMPLANT
L-HOOK LAP DISP 36CM (ELECTROSURGICAL) ×1
LHOOK LAP DISP 36CM (ELECTROSURGICAL) ×1 IMPLANT
NS IRRIG 1000ML POUR BTL (IV SOLUTION) ×1 IMPLANT
PAD ARMBOARD 7.5X6 YLW CONV (MISCELLANEOUS) ×1 IMPLANT
PENCIL BUTTON HOLSTER BLD 10FT (ELECTRODE) ×1 IMPLANT
POUCH SPECIMEN RETRIEVAL 10MM (ENDOMECHANICALS) ×1 IMPLANT
SCISSORS LAP 5X35 DISP (ENDOMECHANICALS) ×1 IMPLANT
SET IRRIG TUBING LAPAROSCOPIC (IRRIGATION / IRRIGATOR) ×1 IMPLANT
SET TUBE SMOKE EVAC HIGH FLOW (TUBING) ×1 IMPLANT
SLEEVE Z-THREAD 5X100MM (TROCAR) IMPLANT
SUT MNCRL AB 4-0 PS2 18 (SUTURE) ×1 IMPLANT
SUT VIC AB 3-0 SH 27 (SUTURE) ×1
SUT VIC AB 3-0 SH 27XBRD (SUTURE) IMPLANT
SUT VICRYL 0 UR6 27IN ABS (SUTURE) IMPLANT
SYS BAG RETRIEVAL 10MM (BASKET) ×1
SYSTEM BAG RETRIEVAL 10MM (BASKET) IMPLANT
TOWEL GREEN STERILE (TOWEL DISPOSABLE) ×1 IMPLANT
TOWEL GREEN STERILE FF (TOWEL DISPOSABLE) ×1 IMPLANT
TRAY LAPAROSCOPIC MC (CUSTOM PROCEDURE TRAY) ×1 IMPLANT
TROCAR XCEL BLUNT TIP 100MML (ENDOMECHANICALS) ×1 IMPLANT
TROCAR Z-THREAD OPTICAL 5X100M (TROCAR) ×1 IMPLANT
WARMER LAPAROSCOPE (MISCELLANEOUS) ×1 IMPLANT
WATER STERILE IRR 1000ML POUR (IV SOLUTION) ×1 IMPLANT

## 2021-09-21 NOTE — Op Note (Signed)
Date: 09/21/21  Patient: Ruth Terry MRN: [id number]  Preoperative Diagnosis: Symptomatic cholelithiasis Postoperative Diagnosis: Same  Procedure: Laparoscopic cholecystectomy  Surgeon: Michaelle Birks, MD Assistant: Malachi Pro, PA-C  EBL: Minimal  Anesthesia: General endotracheal  Specimens: Gallbladder  Indications: [name] is a 66 yo female who presented about two months ago with severe RUQ abdominal pain. Imaging showed gallstones and pericholecystic fluid. The patient opted for outpatient follow up and has continued to have postprandial RUQ pain and discomfort. After a discussion of the risks and benefits of surgery, she agreed to proceed with cholecystectomy.  Findings: Findings of chronic cholecystitis without acute cholecystitis.  Procedure details: Informed consent was obtained in the preoperative area prior to the procedure. The patient was brought to the operating room and placed on the table in the supine position. General anesthesia was induced and appropriate lines and drains were placed for intraoperative monitoring. Perioperative antibiotics were administered per SCIP guidelines. The abdomen was prepped and draped in the usual sterile fashion. A pre-procedure timeout was taken verifying patient identity, surgical site and procedure to be performed.  A small infraumbilical skin incision was made, the subcutaneous tissue was divided with cautery, and the umbilical stalk was grasped and elevated. The fascia was incised and the peritoneal cavity was directly visualized. A 41m Hassan trocar was placed and the abdomen was insufflated. The peritoneal cavity was inspected with no evidence of visceral or vascular injury. Three 544mports were placed in the right subcostal margin, all under direct visualization. The fundus of the gallbladder was grasped and retracted cephalad. There were omental adhesions to the gallbladder, which were taken down using cautery and gentle blunt  dissection. The infundibulum was retracted laterally. The cystic triangle was dissected out using cautery and blunt dissection. The cystic artery was adherent to the cystic duct and was separated with blunt dissection. The critical view of safety was obtained, and the cystic duct and cystic artery were clipped and ligated, leaving two clips behind on the cystic duct stump. The gallbladder was taken off the liver using cautery, and the specimen was placed in an endocatch bag. The surgical site was irrigated with saline until the effluent was clear. The gallbladder fossa appeared hemostatic. The cystic duct and artery stumps were visually inspected and there was no evidence of bile leak or bleeding. The ports were removed under direct visualization and the abdomen was desufflated. The specimen was extracted via the umbilical port site. The umbilical port site fascia was closed with a 0 vicryl pursestring suture. The skin at all port sites was closed with 4-0 monocryl subcuticular suture. Dermabond was applied.  The patient tolerated the procedure well with no apparent complications. All counts were correct x2 at the end of the procedure. The patient was extubated and taken to PACU in stable condition.  ShMichaelle BirksMD 09/21/21 10:56 AM

## 2021-09-21 NOTE — Transfer of Care (Signed)
Immediate Anesthesia Transfer of Care Note  Patient: Ruth Terry  Procedure(s) Performed: LAPAROSCOPIC CHOLECYSTECTOMY (Abdomen)  Patient Location: PACU  Anesthesia Type:General  Level of Consciousness: awake, alert  and oriented  Airway & Oxygen Therapy: Patient Spontanous Breathing and Patient connected to nasal cannula oxygen  Post-op Assessment: Report given to RN, Post -op Vital signs reviewed and stable, Patient moving all extremities X 4 and Patient able to stick tongue midline  Post vital signs: Reviewed  Last Vitals:  Vitals Value Taken Time  BP 127/54 09/21/21 1100  Temp 97.6   Pulse 83 09/21/21 1100  Resp 13 09/21/21 1100  SpO2 100 % 09/21/21 1100  Vitals shown include unvalidated device data.  Last Pain:  Vitals:   09/21/21 0831  TempSrc:   PainSc: 6          Complications:  Encounter Notable Events  Notable Event Outcome Phase Comment  Difficult to intubate - unexpected  Intraprocedure Filed from anesthesia note documentation.

## 2021-09-21 NOTE — Anesthesia Procedure Notes (Signed)
Procedure Name: Intubation Date/Time: 09/21/2021 10:10 AM  Performed by: Maude Leriche, CRNAPre-anesthesia Checklist: Patient identified, Emergency Drugs available, Suction available and Patient being monitored Patient Re-evaluated:Patient Re-evaluated prior to induction Oxygen Delivery Method: Circle system utilized Preoxygenation: Pre-oxygenation with 100% oxygen Induction Type: IV induction Ventilation: Mask ventilation without difficulty Laryngoscope Size: Glidescope and 3 Grade View: Grade I Tube type: Oral Tube size: 7.0 mm Number of attempts: 2 Airway Equipment and Method: Stylet and Video-laryngoscopy Placement Confirmation: ETT inserted through vocal cords under direct vision, positive ETCO2 and breath sounds checked- equal and bilateral Secured at: 21 cm Tube secured with: Tape Dental Injury: Teeth and Oropharynx as per pre-operative assessment  Difficulty Due To: Difficulty was unanticipated, Difficult Airway- due to anterior larynx and Difficult Airway- due to dentition Comments: 1st attempt Miller 2, Grade III view due to avoidance of poor dentition w/short TM distance

## 2021-09-21 NOTE — Anesthesia Postprocedure Evaluation (Signed)
Anesthesia Post Note  Patient: Ruth Terry  Procedure(s) Performed: LAPAROSCOPIC CHOLECYSTECTOMY (Abdomen)     Patient location during evaluation: PACU Anesthesia Type: General Level of consciousness: awake and alert Pain management: pain level controlled Vital Signs Assessment: post-procedure vital signs reviewed and stable Respiratory status: spontaneous breathing, nonlabored ventilation, respiratory function stable and patient connected to nasal cannula oxygen Cardiovascular status: blood pressure returned to baseline and stable Postop Assessment: no apparent nausea or vomiting Anesthetic complications: yes   Encounter Notable Events  Notable Event Outcome Phase Comment  Difficult to intubate - unexpected  Intraprocedure Filed from anesthesia note documentation.    Last Vitals:  Vitals:   09/21/21 1130 09/21/21 1145  BP: (!) 103/58 (!) 110/56  Pulse: 80 70  Resp: 11 16  Temp:  36.7 C  SpO2: 94% 96%    Last Pain:  Vitals:   09/21/21 1130  TempSrc:   PainSc: 6                  [name] S

## 2021-09-21 NOTE — H&P (Signed)
Ruth Terry is an 66 y.o. female.   Chief Complaint: abdominal pain HPI: Ruth Terry is a 66 y.o. female who was referred to me for evaluation of gallstones. In early July, she had an episode of severe abdominal pain, in the RUQ with radiation around the right flank to the back. This prompted her to go to the ED on July 6. Labs including LFTs and lipase at that time were normal. A CT scan and RUQ [place] both showed mild pericholecystic fluid, gallbladder wall-thickening and gallstones. The patient preferred outpatient surgery follow up. She has continued to have intermittent RUQ pain and abdominal discomfort, often after eating. She also has chronic constipation and other chronic GI symptoms, for which she follows with Dr. Henrene Pastor at [place] and has previously had an extensive workup.   She also had a cardiac workup last month for chest pain, and had a LHC on 7/19. This showed stenosis of the RCA and proximal LAD, and medical management was recommended. She recently saw cardiology on 8/7 and per their note she was stratified as an acceptable risk for surgery.  She is here today for surgery.  Past Medical History:  Diagnosis Date   Allergy    Anal fissure    Cataract    Chronic constipation    Coronary artery disease    Diverticulosis 04/20/2010   colonoscopy   Gastroparesis    GERD (gastroesophageal reflux disease)    Hemorrhoids    Hyperlipidemia    IBS (irritable bowel syndrome)    MVP (mitral valve prolapse)    as a teenager per patient, no issues since   Osteoporosis    Polyp, stomach 04/20/2010   egd   Shingles    UTI (lower urinary tract infection)     Past Surgical History:  Procedure Laterality Date   COLONOSCOPY     ESOPHAGOGASTRODUODENOSCOPY     INTRAVASCULAR PRESSURE WIRE/FFR STUDY N/A 07/22/2021   Procedure: INTRAVASCULAR PRESSURE WIRE/FFR STUDY;  Surgeon: Belva Crome, MD;  Location: [place] [facility];  Service: Cardiovascular;  Laterality: N/A;   LEFT HEART  CATH AND CORONARY ANGIOGRAPHY N/A 07/22/2021   Procedure: LEFT HEART CATH AND CORONARY ANGIOGRAPHY;  Surgeon: Belva Crome, MD;  Location: [place] [facility];  Service: Cardiovascular;  Laterality: N/A;    Family History  Problem Relation Age of Onset   Hypertension Mother    Diabetes Father    Heart attack Father        Age 5   Colon cancer Neg Hx    Esophageal cancer Neg Hx    Rectal cancer Neg Hx    Social History:  reports that she has been smoking cigarettes. She has a 20.00 pack-year smoking history. She has never used smokeless tobacco. She reports that she does not currently use alcohol. She reports that she does not use drugs.  Allergies:  Allergies  Allergen Reactions   Chlordiazepoxide-Clidinium Itching    (Librax)   Ciprofloxacin Itching    Irritates stomach   Flagyl [Metronidazole]     Severe yeast infection   Sulfa Drugs Cross Reactors Nausea Only   Nitrofurantoin Monohyd Macro Rash   Omnicef [Cefdinir] Nausea And Vomiting    Facility-Administered Medications Prior to Admission  Medication Dose Route Frequency Provider Last Rate Last Admin   methylPREDNISolone acetate (DEPO-MEDROL) injection 40 mg  40 mg Intramuscular Once Gottschalk, Ashly M, DO       Medications Prior to Admission  Medication Sig Dispense Refill   aspirin  EC 81 MG tablet Take 1 tablet (81 mg total) by mouth daily. Swallow whole. 90 tablet 3   Cholecalciferol (VITAMIN D3 PO) Take 1 tablet by mouth daily.     docusate sodium (COLACE) 100 MG capsule Take 1 capsule (100 mg total) by mouth 2 (two) times daily. 20 capsule 0   erythromycin ophthalmic ointment Place 1 Application into the right eye at bedtime.     ezetimibe (ZETIA) 10 MG tablet Take 1 tablet (10 mg total) by mouth daily. 90 tablet 3   famotidine (PEPCID) 20 MG tablet One after supper     fluticasone (FLONASE) 50 MCG/ACT nasal spray Place 1 spray into both nostrils daily as needed for allergies or rhinitis.      HYDROcodone-acetaminophen (NORCO/VICODIN) 5-325 MG tablet Take 2 tablets by mouth every 4 (four) hours as needed. 6 tablet 0   levocetirizine (XYZAL) 5 MG tablet Take 1 tablet (5 mg total) by mouth every evening. For allergies 90 tablet 3   linaclotide (LINZESS) 145 MCG CAPS capsule Take 1 capsule (145 mcg total) by mouth daily before breakfast. 30 capsule 11   nitroGLYCERIN (NITROSTAT) 0.4 MG SL tablet Place 1 tablet (0.4 mg total) under the tongue every 5 (five) minutes as needed for chest pain. 25 tablet 3   ofloxacin (OCUFLOX) 0.3 % ophthalmic solution Place 1 drop into the right eye See admin instructions. Instill 1 drop into the right eye every hour     ondansetron (ZOFRAN) 4 MG tablet Take 1 tablet (4 mg total) by mouth every 8 (eight) hours as needed for nausea or vomiting. 4 tablet 0   pantoprazole (PROTONIX) 40 MG tablet Take 1 tablet (40 mg total) by mouth daily. 90 tablet 3   psyllium (METAMUCIL) 58.6 % packet Take 1 packet by mouth every evening.     Simethicone (PHAZYME MAXIMUM STRENGTH) 250 MG CAPS Take 250 mg by mouth 3 (three) times daily. (Patient taking differently: Take 250 mg by mouth 3 (three) times daily as needed (gas relief).) 14 capsule    valACYclovir (VALTREX) 1000 MG tablet TAKE 1 TABLET (1,000 MG TOTAL) BY MOUTH AT BEDTIME. 90 tablet 0   alfuzosin (UROXATRAL) 10 MG 24 hr tablet Take 1 tablet (10 mg total) by mouth daily with breakfast. (Patient not taking: Reported on 09/15/2021) 30 tablet 11   hyoscyamine (LEVSIN SL) 0.125 MG SL tablet Place 1 tablet (0.125 mg total) under the tongue every 6 (six) hours as needed. 40 tablet 0    No results found for this or any previous visit (from the past 48 hour(s)). No results found.  Review of Systems  Blood pressure 116/86, pulse 73, temperature 97.9 F (36.6 C), temperature source Oral, resp. rate 20, height 5' (1.524 m), weight 47.6 kg, SpO2 95 %. Physical Exam Vitals reviewed.  Constitutional:      General: She is not in  acute distress.    Appearance: Normal appearance.  HENT:     Head: Normocephalic and atraumatic.  Eyes:     General: No scleral icterus.    Conjunctiva/sclera: Conjunctivae normal.  Pulmonary:     Effort: Pulmonary effort is normal. No respiratory distress.  Abdominal:     General: There is no distension.     Palpations: Abdomen is soft.     Tenderness: There is no abdominal tenderness.  Skin:    General: Skin is warm and dry.     Coloration: Skin is not jaundiced.  Neurological:     General: No focal deficit  present.     Mental Status: She is alert and oriented to person, place, and time.      Assessment/Plan This is a 66 yo female with symptomatic cholelithiasis. Proceed to OR for laparoscopic cholecystectomy. Informed consent obtained. Postoperative instructions were reviewed verbally, and patient will be provided with written instructions.  Dwan Bolt, MD 09/21/2021, 9:35 AM

## 2021-09-21 NOTE — Discharge Instructions (Addendum)
[facility] DISCHARGE INSTRUCTIONS  Activity No heavy lifting greater than 15 pounds for 4 weeks after surgery. Ok to shower in 24 hours, but do not bathe or submerge incisions underwater. Do not drive while taking narcotic pain medication.  Medications It is common to have constipation after surgery. You should continue taking your usual medications that you take to prevent constipation. Some people have diarrhea after surgery, particularly after eating. You have been prescribed a medication to take for severe pain, which you may take up to every 6 hours as needed.  Wound Care Your incisions are covered with skin glue called Dermabond. This will peel off on its own over time. You may shower and allow warm soapy water to run over your incisions. Gently pat dry. Do not submerge your incision underwater. Monitor your incision for any new redness, tenderness, or drainage.  When to Call us: Fever greater than 100.5 New redness, drainage, or swelling at incision site Severe pain, nausea, or vomiting Jaundice (yellowing of the whites of the eyes or skin)  Follow-up You have an appointment scheduled with Dr. Zenia Resides on October 12, 2021 at 9:50am. This will be at the [facility] office at [address] [place], [place], [place]. Please arrive at least 15 minutes prior to your scheduled appointment time.  For questions or concerns, please call the office at [phone].

## 2021-09-22 ENCOUNTER — Encounter (HOSPITAL_COMMUNITY): Payer: Self-pay | Admitting: Surgery

## 2021-09-22 LAB — SURGICAL PATHOLOGY

## 2021-09-27 ENCOUNTER — Encounter (HOSPITAL_COMMUNITY): Payer: Self-pay

## 2021-09-27 ENCOUNTER — Emergency Department (HOSPITAL_COMMUNITY): Payer: BC Managed Care – PPO

## 2021-09-27 ENCOUNTER — Other Ambulatory Visit: Payer: Self-pay

## 2021-09-27 ENCOUNTER — Emergency Department (HOSPITAL_COMMUNITY)
Admission: EM | Admit: 2021-09-27 | Discharge: 2021-09-27 | Disposition: A | Discharge status: Home / Self Care | Payer: BC Managed Care – PPO | Attending: Emergency Medicine | Admitting: Emergency Medicine

## 2021-09-27 DIAGNOSIS — R1011 Right upper quadrant pain: Secondary | ICD-10-CM

## 2021-09-27 DIAGNOSIS — Z7982 Long term (current) use of aspirin: Secondary | ICD-10-CM | POA: Diagnosis not present

## 2021-09-27 DIAGNOSIS — M549 Dorsalgia, unspecified: Secondary | ICD-10-CM | POA: Diagnosis not present

## 2021-09-27 DIAGNOSIS — R748 Abnormal levels of other serum enzymes: Secondary | ICD-10-CM | POA: Diagnosis not present

## 2021-09-27 DIAGNOSIS — R112 Nausea with vomiting, unspecified: Secondary | ICD-10-CM | POA: Insufficient documentation

## 2021-09-27 DIAGNOSIS — R109 Unspecified abdominal pain: Secondary | ICD-10-CM | POA: Diagnosis not present

## 2021-09-27 DIAGNOSIS — R7401 Elevation of levels of liver transaminase levels: Secondary | ICD-10-CM | POA: Diagnosis not present

## 2021-09-27 DIAGNOSIS — R1084 Generalized abdominal pain: Secondary | ICD-10-CM | POA: Insufficient documentation

## 2021-09-27 DIAGNOSIS — I700 Atherosclerosis of aorta: Secondary | ICD-10-CM | POA: Diagnosis not present

## 2021-09-27 LAB — COMPREHENSIVE METABOLIC PANEL WITH GFR
ALT: 203 U/L — ABNORMAL HIGH (ref 0–44)
AST: 464 U/L — ABNORMAL HIGH (ref 15–41)
Albumin: 4.2 g/dL (ref 3.5–5.0)
Alkaline Phosphatase: 228 U/L — ABNORMAL HIGH (ref 38–126)
Anion gap: 12 (ref 5–15)
BUN: 10 mg/dL (ref 8–23)
CO2: 24 mmol/L (ref 22–32)
Calcium: 9.1 mg/dL (ref 8.9–10.3)
Chloride: 98 mmol/L (ref 98–111)
Creatinine, Ser: 0.62 mg/dL (ref 0.44–1.00)
GFR, Estimated: >60 mL/min
Glucose, Bld: 117 mg/dL — ABNORMAL HIGH (ref 70–99)
Potassium: 3.9 mmol/L (ref 3.5–5.1)
Sodium: 134 mmol/L — ABNORMAL LOW (ref 135–145)
Total Bilirubin: 1.8 mg/dL — ABNORMAL HIGH (ref 0.3–1.2)
Total Protein: 7.7 g/dL (ref 6.5–8.1)

## 2021-09-27 LAB — URINALYSIS, ROUTINE W REFLEX MICROSCOPIC
Bacteria, UA: NONE SEEN
Glucose, UA: NEGATIVE mg/dL
Hgb urine dipstick: NEGATIVE
Ketones, ur: NEGATIVE mg/dL
Leukocytes,Ua: NEGATIVE
Nitrite: NEGATIVE
Protein, ur: 30 mg/dL — AB
Specific Gravity, Urine: 1.028 (ref 1.005–1.030)
pH: 6 (ref 5.0–8.0)

## 2021-09-27 LAB — CBC WITH DIFFERENTIAL/PLATELET
Abs Immature Granulocytes: 0.01 10*3/uL (ref 0.00–0.07)
Basophils Absolute: 0 10*3/uL (ref 0.0–0.1)
Basophils Relative: 0 %
Eosinophils Absolute: 0 10*3/uL (ref 0.0–0.5)
Eosinophils Relative: 1 %
HCT: 42.1 % (ref 36.0–46.0)
Hemoglobin: 14 g/dL (ref 12.0–15.0)
Immature Granulocytes: 0 %
Lymphocytes Relative: 15 %
Lymphs Abs: 1 10*3/uL (ref 0.7–4.0)
MCH: 29.3 pg (ref 26.0–34.0)
MCHC: 33.3 g/dL (ref 30.0–36.0)
MCV: 88.1 fL (ref 80.0–100.0)
Monocytes Absolute: 0.5 10*3/uL (ref 0.1–1.0)
Monocytes Relative: 8 %
Neutro Abs: 4.9 10*3/uL (ref 1.7–7.7)
Neutrophils Relative %: 76 %
Platelets: 240 10*3/uL (ref 150–400)
RBC: 4.78 MIL/uL (ref 3.87–5.11)
RDW: 13.2 % (ref 11.5–15.5)
WBC: 6.5 10*3/uL (ref 4.0–10.5)
nRBC: 0 % (ref 0.0–0.2)

## 2021-09-27 LAB — LIPASE, BLOOD: Lipase: 38 U/L (ref 11–51)

## 2021-09-27 MED ORDER — SODIUM CHLORIDE 0.9 % IV BOLUS
1000.0000 mL | Freq: Once | INTRAVENOUS | Status: AC
  Administered 2021-09-27: 1000 mL via INTRAVENOUS

## 2021-09-27 MED ORDER — IOHEXOL 300 MG/ML  SOLN
100.0000 mL | Freq: Once | INTRAMUSCULAR | Status: AC | PRN
  Administered 2021-09-27: 100 mL via INTRAVENOUS

## 2021-09-27 MED ORDER — HYDROCODONE-ACETAMINOPHEN 5-325 MG PO TABS
1.0000 | ORAL_TABLET | Freq: Once | ORAL | Status: AC
  Administered 2021-09-27: 1 via ORAL
  Filled 2021-09-27: qty 1

## 2021-09-27 MED ORDER — ONDANSETRON HCL 4 MG/2ML IJ SOLN
4.0000 mg | Freq: Once | INTRAMUSCULAR | Status: AC
  Administered 2021-09-27: 4 mg via INTRAVENOUS
  Filled 2021-09-27: qty 2

## 2021-09-27 MED ORDER — KETOROLAC TROMETHAMINE 15 MG/ML IJ SOLN
15.0000 mg | Freq: Once | INTRAMUSCULAR | Status: AC
  Administered 2021-09-27: 15 mg via INTRAVENOUS
  Filled 2021-09-27: qty 1

## 2021-09-27 NOTE — ED Provider Notes (Signed)
Signout from [employer] at shift change. Briefly, patient presents for right upper quadrant abdominal pain and vomiting, recent cholecystectomy.   Plan: Labs, imaging   9:04 PM Reassessment performed. Patient appears overall appears comfortable.  No distress.  She is drinking some fluids and eating peanut butter crackers at bedside.  Labs and imaging personally reviewed and interpreted including: CBC unremarkable; CMP with elevated AST, ALT, alkaline phosphatase, minimally elevated T. bili, otherwise unremarkable; lipase normal; UA without compelling signs of infection.   Reviewed additional pertinent lab work and imaging with patient at bedside.  Caregiver also present.   Most current vital signs reviewed and are as follows: BP 135/82   Pulse 88   Temp 98 F (36.7 C) (Oral)   Resp 19   Ht 5' (1.524 m)   Wt 47.6 kg   SpO2 96%   BMI 20.51 kg/m   Patient discussed with Dr. Laverta Baltimore.  Plan: Discharge to home, contact surgeon tomorrow to arrange follow-up.   Home treatment: Continued use of home medications prescribed after surgery.   Return and follow-up instructions: The patient was urged to return to the Emergency Department immediately with worsening of current symptoms, worsening abdominal pain, persistent vomiting, blood noted in stools, fever, or any other concerns. The patient verbalized understanding.   Discussed that she will need to have her liver function tests rechecked in the next 1 week.  For this patient's complaint of abdominal pain, the following conditions were considered on the differential diagnosis: gastritis/PUD, enteritis/duodenitis, appendicitis, cholelithiasis/cholecystitis, cholangitis, pancreatitis, ruptured viscus, colitis, diverticulitis, small/large bowel obstruction, proctitis, cystitis, pyelonephritis, ureteral colic, aortic dissection, aortic aneurysm. In women, ectopic pregnancy, pelvic inflammatory disease, ovarian cysts, and tubo-ovarian abscess were  also considered. Atypical chest etiologies were also considered including ACS, PE, and pneumonia.   CT today with expected postoperative fluid.  No sign of abscess.  No sign of bowel obstruction.  Mild transaminitis with mild elevation in T. bili to 1.8.  Symptoms are well controlled.  Do not feel that she needs to be sent for MRCP or further evaluation of biliary obstruction at this time.  However, she will need close follow-up and recheck of labs to ensure LFTs are improving.  The patient's vital signs, pertinent lab work and imaging were reviewed and interpreted as discussed in the ED course. Hospitalization was considered for further testing, treatments, or serial exams/observation. However as patient is well-appearing, has a stable exam, and reassuring studies today, I do not feel that they warrant admission at this time. This plan was discussed with the patient who verbalizes agreement and comfort with this plan and seems reliable and able to return to the Emergency Department with worsening or changing symptoms.        Carlisle Cater, PA-C 09/27/21 2107    Margette Fast, MD 09/28/21 2002

## 2021-09-27 NOTE — ED Provider Notes (Signed)
[facility] EMERGENCY DEPARTMENT Provider Note   CSN: [id number] Arrival date & time: 09/27/21  1633     History Chief Complaint  Patient presents with   Abdominal Pain    Ruth Terry is a 66 y.o. female patient with history of gastroparesis and IBS who just underwent a cholecystectomy on 09/21/2021.  She presents to the ED today with abdominal pain and back pain started last night with associated nausea and vomiting.  She denies any diarrhea, fever, chills.  She does state that her urine is darker than normal which is abnormal for her as her urine is normally clear.  She has been drinking plenty of p.o. fluids.  She was advised by her surgical team to come to the ED for further evaluation.   Abdominal Pain      Home Medications Prior to Admission medications   Medication Sig Start Date End Date Taking? Authorizing Provider  alfuzosin (UROXATRAL) 10 MG 24 hr tablet Take 1 tablet (10 mg total) by mouth daily with breakfast. Patient not taking: Reported on 09/15/2021 09/02/21   Summerlin, Berneice Heinrich, PA-C  aspirin EC 81 MG tablet Take 1 tablet (81 mg total) by mouth daily. Swallow whole. 07/17/21   Burnell Blanks, MD  Cholecalciferol (VITAMIN D3 PO) Take 1 tablet by mouth daily.    [provider]  docusate sodium (COLACE) 100 MG capsule Take 1 capsule (100 mg total) by mouth 2 (two) times daily. 09/02/21   Summerlin, Berneice Heinrich, PA-C  erythromycin ophthalmic ointment Place 1 Application into the right eye at bedtime. 09/14/21   [provider]  ezetimibe (ZETIA) 10 MG tablet Take 1 tablet (10 mg total) by mouth daily. 07/27/21   Belva Crome, MD  famotidine (PEPCID) 20 MG tablet One after supper 05/12/21   Tanda Rockers, MD  fluticasone [facility]) 50 MCG/ACT nasal spray Place 1 spray into both nostrils daily as needed for allergies or rhinitis.    [provider]  hyoscyamine (LEVSIN SL) 0.125 MG SL tablet Place 1 tablet (0.125 mg total)  under the tongue every 6 (six) hours as needed. 07/31/21   Irene Shipper, MD  levocetirizine (XYZAL) 5 MG tablet Take 1 tablet (5 mg total) by mouth every evening. For allergies 08/22/20   [name], DO  linaclotide [facility]) 145 MCG CAPS capsule Take 1 capsule (145 mcg total) by mouth daily before breakfast. 03/26/21   Esterwood, [name] S, PA-C  nitroGLYCERIN (NITROSTAT) 0.4 MG SL tablet Place 1 tablet (0.4 mg total) under the tongue every 5 (five) minutes as needed for chest pain. 07/17/21   Burnell Blanks, MD  ofloxacin (OCUFLOX) 0.3 % ophthalmic solution Place 1 drop into the right eye See admin instructions. Instill 1 drop into the right eye every hour 09/14/21   [provider]  ondansetron (ZOFRAN) 4 MG tablet Take 1 tablet (4 mg total) by mouth every 8 (eight) hours as needed for nausea or vomiting. 07/09/21   Triplett, Tammy, PA-C  pantoprazole (PROTONIX) 40 MG tablet Take 1 tablet (40 mg total) by mouth daily. 01/22/21   Irene Shipper, MD  psyllium (METAMUCIL) 58.6 % packet Take 1 packet by mouth every evening.    [provider]  Simethicone (PHAZYME MAXIMUM STRENGTH) 250 MG CAPS Take 250 mg by mouth 3 (three) times daily. Patient taking differently: Take 250 mg by mouth 3 (three) times daily as needed (gas relief). 02/20/16   Terald Sleeper, PA-C  valACYclovir (VALTREX) 1000  MG tablet TAKE 1 TABLET (1,000 MG TOTAL) BY MOUTH AT BEDTIME. 01/26/21   Hassell Done, Mary-Margaret, FNP  AMBULATORY NON FORMULARY MEDICATION Medication Name: Domperidone 10 mg Take 1 tablet at bedtime daily. Patient taking differently: Take 1 tablet by mouth at bedtime. Medication Name: Domperidone 10 mg Take 1 tablet at bedtime daily. 09/08/16 05/15/19  Esterwood, [name] S, PA-C      Allergies    Chlordiazepoxide-clidinium, Ciprofloxacin, Flagyl [metronidazole], Sulfa drugs cross reactors, Nitrofurantoin monohyd macro, and Omnicef [cefdinir]    Review of Systems   Review of Systems   Gastrointestinal:  Positive for abdominal pain.  All other systems reviewed and are negative.   Physical Exam Updated Vital Signs BP 135/72 (BP Location: Right Arm)   Pulse 64   Temp 98 F (36.7 C) (Oral)   Resp 18   Ht 5' (1.524 m)   Wt 47.6 kg   SpO2 97%   BMI 20.51 kg/m  Physical Exam Vitals and nursing note reviewed.  Constitutional:      General: She is not in acute distress.    Appearance: Normal appearance.  HENT:     Head: Normocephalic and atraumatic.  Eyes:     General:        Right eye: No discharge.        Left eye: No discharge.  Cardiovascular:     Comments: Regular rate and rhythm.  S1/S2 are distinct without any evidence of murmur, rubs, or gallops.  Radial pulses are 2+ bilaterally.  Dorsalis pedis pulses are 2+ bilaterally.  No evidence of pedal edema. Pulmonary:     Comments: Clear to auscultation bilaterally.  Normal effort.  No respiratory distress.  No evidence of wheezes, rales, or rhonchi heard throughout. Abdominal:     General: Abdomen is flat. Bowel sounds are normal. There is no distension.     Tenderness: There is generalized abdominal tenderness. There is no guarding or rebound.     Comments: Well-healing laparoscopic incisions over the upper abdomen and umbilicus.  Musculoskeletal:        General: Normal range of motion.     Cervical back: Neck supple.  Skin:    General: Skin is warm and dry.     Findings: No rash.  Neurological:     General: No focal deficit present.     Mental Status: She is alert.  Psychiatric:        Mood and Affect: Mood normal.        Behavior: Behavior normal.     ED Results / Procedures / Treatments   Labs (all labs ordered are listed, but only abnormal results are displayed) Labs Reviewed  CBC WITH DIFFERENTIAL/PLATELET  COMPREHENSIVE METABOLIC PANEL  URINALYSIS, ROUTINE W REFLEX MICROSCOPIC    EKG None  Radiology No results found.  Procedures Procedures    Medications Ordered in  ED Medications  ketorolac (TORADOL) 15 MG/ML injection 15 mg (has no administration in time range)  sodium chloride 0.9 % bolus 1,000 mL (has no administration in time range)    ED Course/ Medical Decision Making/ A&P Clinical Course as of 09/27/21 1903  Sun Sep 27, 2021  1813 CBC with Differential Normal.  [CF]  1813 Comprehensive metabolic panel(!) Mild hyponatremia. There is significant transaminitis in the setting of recent cholecystectomy. [CF]  7322 Urinalysis, Routine w reflex microscopic Urine, Clean Catch(!) No significant signs of infection.  [CF]  1814 Lipase, blood Normal.  [CF]    Clinical Course User Index [CF] Hendricks Limes, PA-C  Medical Decision Making ANIKKA MARSAN is a 66 y.o. female patient who presents to the emergency department today for further evaluation of abdominal pain.  Given the patient's recent surgical history of cholecystectomy I will likely get a CT scan to further evaluate.  I also get labs, give her fluids, pain medication and plan to reassess.   Amount and/or Complexity of Data Reviewed Labs: ordered. Decision-making details documented in ED Course. Radiology: ordered.  Risk Prescription drug management.   Final Clinical Impression(s) / ED Diagnoses Final diagnoses:  None    Rx / DC Orders ED Discharge Orders     None         Cherrie Gauze 09/27/21 1903    Margette Fast, MD 09/28/21 2002

## 2021-09-27 NOTE — Discharge Instructions (Signed)
Please read and follow all provided instructions.  Your diagnoses today include:  1. Right upper quadrant abdominal pain   2. Elevated liver enzymes     Tests performed today include: Blood cell counts and platelets: White and red blood cell counts were normal Kidney and liver function tests: Your liver function tests were elevated today, these will need to be rechecked at some point in the next week Pancreas function test (called lipase): Was normal Urine test to look for infection: No sign of infection CT scan of the abdomen pelvis: Showed some fluid around the gallbladder, but no signs of infection or complications from recent surgery, no signs of bowel obstruction Vital signs. See below for your results today.   Medications prescribed:  None  Take any prescribed medications only as directed.  Home care instructions:  Follow any educational materials contained in this packet.  Follow-up instructions: Please contact your surgeon tomorrow, let them know how you are feeling, that she was seen in the emergency department, and that your liver function tests were elevated.  They will give you further instructions as far as follow-up and recheck.  Return instructions:  SEEK IMMEDIATE MEDICAL ATTENTION IF: The pain does not go away or becomes severe  A temperature above 101F develops  Repeated vomiting occurs (multiple episodes)  The pain becomes localized to portions of the abdomen. The right side could possibly be appendicitis. In an adult, the left lower portion of the abdomen could be colitis or diverticulitis.  Blood is being passed in stools or vomit (bright red or black tarry stools)  You develop chest pain, difficulty breathing, dizziness or fainting, or become confused, poorly responsive, or inconsolable (young children) If you have any other emergent concerns regarding your health  Additional Information: Abdominal (belly) pain can be caused by many things. Your caregiver  performed an examination and possibly ordered blood/urine tests and imaging (CT scan, x-rays, ultrasound). Many cases can be observed and treated at home after initial evaluation in the emergency department. Even though you are being discharged home, abdominal pain can be unpredictable. Therefore, you need a repeated exam if your pain does not resolve, returns, or worsens. Most patients with abdominal pain don't have to be admitted to the hospital or have surgery, but serious problems like appendicitis and gallbladder attacks can start out as nonspecific pain. Many abdominal conditions cannot be diagnosed in one visit, so follow-up evaluations are very important.  Your vital signs today were: BP 135/82   Pulse 88   Temp 98 F (36.7 C) (Oral)   Resp 19   Ht 5' (1.524 m)   Wt 47.6 kg   SpO2 96%   BMI 20.51 kg/m  If your blood pressure (bp) was elevated above 135/85 this visit, please have this repeated by your doctor within one month. --------------

## 2021-09-27 NOTE — ED Triage Notes (Signed)
[facility] reports pt coming from home c/o abdominal pain and back pain with nausea. Recent gallbladder surgery on Monday.

## 2021-09-28 DIAGNOSIS — G8918 Other acute postprocedural pain: Secondary | ICD-10-CM | POA: Diagnosis not present

## 2021-09-29 ENCOUNTER — Other Ambulatory Visit: Payer: Self-pay | Admitting: Student

## 2021-09-29 ENCOUNTER — Other Ambulatory Visit (HOSPITAL_COMMUNITY): Payer: Self-pay | Admitting: Student

## 2021-09-29 ENCOUNTER — Ambulatory Visit (HOSPITAL_COMMUNITY)
Admission: RE | Admit: 2021-09-29 | Discharge: 2021-09-29 | Disposition: A | Discharge status: Home / Self Care | Payer: BC Managed Care – PPO | Source: Ambulatory Visit | Attending: Student | Admitting: Student

## 2021-09-29 ENCOUNTER — Encounter (HOSPITAL_COMMUNITY): Payer: Self-pay

## 2021-09-29 DIAGNOSIS — R935 Abnormal findings on diagnostic imaging of other abdominal regions, including retroperitoneum: Secondary | ICD-10-CM | POA: Diagnosis not present

## 2021-09-29 DIAGNOSIS — G8918 Other acute postprocedural pain: Secondary | ICD-10-CM

## 2021-09-29 DIAGNOSIS — Z9049 Acquired absence of other specified parts of digestive tract: Secondary | ICD-10-CM | POA: Diagnosis not present

## 2021-09-29 DIAGNOSIS — R1011 Right upper quadrant pain: Secondary | ICD-10-CM | POA: Diagnosis not present

## 2021-09-29 MED ORDER — GADOPICLENOL 0.5 MMOL/ML IV SOLN
4.0000 mL | Freq: Once | INTRAVENOUS | Status: AC | PRN
  Administered 2021-09-29: 4 mL via INTRAVENOUS

## 2021-09-30 ENCOUNTER — Other Ambulatory Visit (HOSPITAL_COMMUNITY): Payer: BC Managed Care – PPO

## 2021-09-30 DIAGNOSIS — R7989 Other specified abnormal findings of blood chemistry: Secondary | ICD-10-CM | POA: Diagnosis not present

## 2021-10-01 ENCOUNTER — Inpatient Hospital Stay: Admission: RE | Admit: 2021-10-01 | Payer: BC Managed Care – PPO | Admitting: Surgery

## 2021-10-07 DIAGNOSIS — R7989 Other specified abnormal findings of blood chemistry: Secondary | ICD-10-CM | POA: Diagnosis not present

## 2021-10-07 DIAGNOSIS — G8918 Other acute postprocedural pain: Secondary | ICD-10-CM | POA: Diagnosis not present

## 2021-10-08 ENCOUNTER — Other Ambulatory Visit: Payer: Self-pay | Admitting: Acute Care

## 2021-10-08 DIAGNOSIS — Z87891 Personal history of nicotine dependence: Secondary | ICD-10-CM

## 2021-10-08 DIAGNOSIS — Z122 Encounter for screening for malignant neoplasm of respiratory organs: Secondary | ICD-10-CM

## 2021-10-09 DIAGNOSIS — R300 Dysuria: Secondary | ICD-10-CM | POA: Diagnosis not present

## 2021-10-09 DIAGNOSIS — K649 Unspecified hemorrhoids: Secondary | ICD-10-CM | POA: Diagnosis not present

## 2021-10-09 DIAGNOSIS — R350 Frequency of micturition: Secondary | ICD-10-CM | POA: Diagnosis not present

## 2021-10-11 NOTE — Progress Notes (Unsigned)
Patient ID: BREONDA DELUCCIA                 DOB: 17-Aug-1955                    MRN: [id number]     HPI: Ruth Terry is a 66 y.o. female patient referred to lipid clinic by Manson Passey, with Dr. Katrinka Blazing as primary cardiologist. PMH is significant for GERD, IBS, aortic atherosclerosis, CAD, and COPD.  Patient had complained of chest pain, after which cardiology scheduled her on 07/15/21 for CTA, which showed CAC of 186, in the 89th percentile. After this testing patient presented to the ED for unstable angina, undergoing left heart cath on 07/22/21. RCA was found to have 50-70% occlusion. EF at that time was 65%. Pt was last seen by cardiology on 08/10/21 for follow-up and surgical clearance for cholecystectomy. At that time, the patient reported being unable to tolerate rosuvastatin 5 mg daily and was instructed to reduce dosing to three times weekly. Since that time, patient reported to the ED with right upper quadrant pain that was determined to be secondary to her cholecystectomy on 09/21/21.   BCBS Commercial   Attempted BCBS PA for Repatha, however, the form requires the patient to trial both atorvastatin and rosuvastatin.   Current Medications: ezetimibe 10 mg daily Intolerances: rosuvastatin 5 mg daily (myalgias), Vascepa 2g twice daily Risk Factors: CAC score LDL goal: <55  Diet:   Exercise:   Family History:  Family History  Problem Relation Age of Onset   Hypertension Mother    Diabetes Father    Heart attack Father        Age 56   Colon cancer Neg Hx    Esophageal cancer Neg Hx    Rectal cancer Neg Hx    Social History:  Social History   Socioeconomic History   Marital status: Married    Spouse name: Not on file   Number of children: 0   Years of education: Not on file   Highest education level: Not on file  Occupational History   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    Employer: [employer]  Tobacco Use   Smoking  status: Some Days    Packs/day: 1.00    Years: 20.00    Total pack years: 20.00    Types: Cigarettes    Last attempt to quit: 05/2020    Years since quitting: 1.4   Smokeless tobacco: Never   Tobacco comments:    USE SMOKELESS CIGARETTES  Vaping Use   Vaping Use: Some days   Substances: Nicotine  Substance and Sexual Activity   Alcohol use: Not Currently    Comment: 1 a week, wine   Drug use: No   Sexual activity: Not Currently  Other Topics Concern   Not on file  Social History Narrative   Not on file   Social Determinants of Health   Financial Resource Strain: Not on file  Food Insecurity: Not on file  Transportation Needs: Not on file  Physical Activity: Not on file  Stress: Not on file  Social Connections: Not on file  Intimate Partner Violence: Not on file   Labs:  Lipid panel 08/06/19 on no background treatment: TC 235, TG 133, HDL 72, VLDL 23, LDL 140  Past Medical History:  Diagnosis Date   Allergy    Anal fissure    Cataract    Chronic constipation  Coronary artery disease    Diverticulosis 04/20/2010   colonoscopy   Gastroparesis    GERD (gastroesophageal reflux disease)    Hemorrhoids    Hyperlipidemia    IBS (irritable bowel syndrome)    MVP (mitral valve prolapse)    as a teenager per patient, no issues since   Osteoporosis    Polyp, stomach 04/20/2010   egd   Shingles    UTI (lower urinary tract infection)     Current Outpatient Medications on File Prior to Visit  Medication Sig Dispense Refill   alfuzosin (UROXATRAL) 10 MG 24 hr tablet Take 1 tablet (10 mg total) by mouth daily with breakfast. (Patient not taking: Reported on 09/15/2021) 30 tablet 11   aspirin EC 81 MG tablet Take 1 tablet (81 mg total) by mouth daily. Swallow whole. 90 tablet 3   Cholecalciferol (VITAMIN D3 PO) Take 1 tablet by mouth daily.     docusate sodium (COLACE) 100 MG capsule Take 1 capsule (100 mg total) by mouth 2 (two) times daily. 20 capsule 0    erythromycin ophthalmic ointment Place 1 Application into the right eye at bedtime.     ezetimibe (ZETIA) 10 MG tablet Take 1 tablet (10 mg total) by mouth daily. 90 tablet 3   famotidine (PEPCID) 20 MG tablet One after supper     fluticasone (FLONASE) 50 MCG/ACT nasal spray Place 1 spray into both nostrils daily as needed for allergies or rhinitis.     hyoscyamine (LEVSIN SL) 0.125 MG SL tablet Place 1 tablet (0.125 mg total) under the tongue every 6 (six) hours as needed. 40 tablet 0   levocetirizine (XYZAL) 5 MG tablet Take 1 tablet (5 mg total) by mouth every evening. For allergies 90 tablet 3   linaclotide (LINZESS) 145 MCG CAPS capsule Take 1 capsule (145 mcg total) by mouth daily before breakfast. 30 capsule 11   nitroGLYCERIN (NITROSTAT) 0.4 MG SL tablet Place 1 tablet (0.4 mg total) under the tongue every 5 (five) minutes as needed for chest pain. 25 tablet 3   ofloxacin (OCUFLOX) 0.3 % ophthalmic solution Place 1 drop into the right eye See admin instructions. Instill 1 drop into the right eye every hour     ondansetron (ZOFRAN) 4 MG tablet Take 1 tablet (4 mg total) by mouth every 8 (eight) hours as needed for nausea or vomiting. 4 tablet 0   pantoprazole (PROTONIX) 40 MG tablet Take 1 tablet (40 mg total) by mouth daily. 90 tablet 3   psyllium (METAMUCIL) 58.6 % packet Take 1 packet by mouth every evening.     Simethicone (PHAZYME MAXIMUM STRENGTH) 250 MG CAPS Take 250 mg by mouth 3 (three) times daily. (Patient taking differently: Take 250 mg by mouth 3 (three) times daily as needed (gas relief).) 14 capsule    valACYclovir (VALTREX) 1000 MG tablet TAKE 1 TABLET (1,000 MG TOTAL) BY MOUTH AT BEDTIME. 90 tablet 0   [DISCONTINUED] AMBULATORY NON FORMULARY MEDICATION Medication Name: Domperidone 10 mg Take 1 tablet at bedtime daily. (Patient taking differently: Take 1 tablet by mouth at bedtime. Medication Name: Domperidone 10 mg Take 1 tablet at bedtime daily.) 30 tablet 11   Current  Facility-Administered Medications on File Prior to Visit  Medication Dose Route Frequency Provider Last Rate Last Admin   methylPREDNISolone acetate (DEPO-MEDROL) injection 40 mg  40 mg Intramuscular Once Gottschalk, Ashly M, DO        Allergies  Allergen Reactions   Chlordiazepoxide-Clidinium Itching    (Librax)  Ciprofloxacin Itching    Irritates stomach   Flagyl [Metronidazole]     Severe yeast infection   Sulfa Drugs Cross Reactors Nausea Only   Nitrofurantoin Monohyd Macro Rash   Omnicef [Cefdinir] Nausea And Vomiting    Assessment/Plan:  1. Hyperlipidemia -

## 2021-10-12 ENCOUNTER — Ambulatory Visit: Payer: BC Managed Care – PPO | Attending: Internal Medicine

## 2021-10-15 DIAGNOSIS — L309 Dermatitis, unspecified: Secondary | ICD-10-CM | POA: Diagnosis not present

## 2021-11-05 DIAGNOSIS — G8918 Other acute postprocedural pain: Secondary | ICD-10-CM | POA: Diagnosis not present

## 2021-11-19 ENCOUNTER — Telehealth: Payer: Self-pay | Admitting: Internal Medicine

## 2021-11-19 NOTE — Telephone Encounter (Signed)
Pt states she is having issues with her reflux. Reports she has epigastric pain that feels like a knife going through her chest between her shoulder blades. She takes protonix daily and pepcid complete. Discussed with her she can try taking protonix bid along with the pepcid complete and she is scheduled to see [name] Esterwood PA 12/6'@1'$ :30pm. Pt aware of appt.

## 2021-11-19 NOTE — Telephone Encounter (Signed)
Patient is calling stating she is have a lot of problems regarding her acid reflux, says it's very painful and would like some further advice from you. Please advise

## 2021-11-25 DIAGNOSIS — K649 Unspecified hemorrhoids: Secondary | ICD-10-CM | POA: Diagnosis not present

## 2021-11-25 DIAGNOSIS — K648 Other hemorrhoids: Secondary | ICD-10-CM | POA: Diagnosis not present

## 2021-12-08 ENCOUNTER — Ambulatory Visit: Payer: BC Managed Care – PPO | Admitting: Interventional Cardiology

## 2021-12-09 ENCOUNTER — Ambulatory Visit (INDEPENDENT_AMBULATORY_CARE_PROVIDER_SITE_OTHER): Payer: BC Managed Care – PPO | Admitting: Physician Assistant

## 2021-12-09 ENCOUNTER — Encounter: Payer: Self-pay | Admitting: Physician Assistant

## 2021-12-09 ENCOUNTER — Other Ambulatory Visit (INDEPENDENT_AMBULATORY_CARE_PROVIDER_SITE_OTHER): Payer: BC Managed Care – PPO

## 2021-12-09 VITALS — BP 106/70 | HR 98 | Ht 60.0 in | Wt 97.2 lb

## 2021-12-09 DIAGNOSIS — K589 Irritable bowel syndrome without diarrhea: Secondary | ICD-10-CM

## 2021-12-09 DIAGNOSIS — R140 Abdominal distension (gaseous): Secondary | ICD-10-CM

## 2021-12-09 DIAGNOSIS — K5909 Other constipation: Secondary | ICD-10-CM | POA: Diagnosis not present

## 2021-12-09 DIAGNOSIS — K219 Gastro-esophageal reflux disease without esophagitis: Secondary | ICD-10-CM

## 2021-12-09 DIAGNOSIS — R109 Unspecified abdominal pain: Secondary | ICD-10-CM

## 2021-12-09 DIAGNOSIS — R12 Heartburn: Secondary | ICD-10-CM

## 2021-12-09 DIAGNOSIS — K6289 Other specified diseases of anus and rectum: Secondary | ICD-10-CM

## 2021-12-09 LAB — CBC WITH DIFFERENTIAL/PLATELET
Basophils Absolute: 0 10*3/uL (ref 0.0–0.1)
Basophils Relative: 0.5 % (ref 0.0–3.0)
Eosinophils Absolute: 0.1 10*3/uL (ref 0.0–0.7)
Eosinophils Relative: 2.8 % (ref 0.0–5.0)
HCT: 38.1 % (ref 36.0–46.0)
Hemoglobin: 13 g/dL (ref 12.0–15.0)
Lymphocytes Relative: 32.5 % (ref 12.0–46.0)
Lymphs Abs: 1.4 10*3/uL (ref 0.7–4.0)
MCHC: 34 g/dL (ref 30.0–36.0)
MCV: 85.5 fl (ref 78.0–100.0)
Monocytes Absolute: 0.4 10*3/uL (ref 0.1–1.0)
Monocytes Relative: 8.6 % (ref 3.0–12.0)
Neutro Abs: 2.4 10*3/uL (ref 1.4–7.7)
Neutrophils Relative %: 55.6 % (ref 43.0–77.0)
Platelets: 203 10*3/uL (ref 150.0–400.0)
RBC: 4.46 Mil/uL (ref 3.87–5.11)
RDW: 13.9 % (ref 11.5–15.5)
WBC: 4.3 10*3/uL (ref 4.0–10.5)

## 2021-12-09 LAB — COMPREHENSIVE METABOLIC PANEL
ALT: 8 U/L (ref 0–35)
AST: 12 U/L (ref 0–37)
Albumin: 4.3 g/dL (ref 3.5–5.2)
Alkaline Phosphatase: 73 U/L (ref 39–117)
BUN: 5 mg/dL — ABNORMAL LOW (ref 6–23)
CO2: 29 mEq/L (ref 19–32)
Calcium: 9 mg/dL (ref 8.4–10.5)
Chloride: 105 mEq/L (ref 96–112)
Creatinine, Ser: 0.71 mg/dL (ref 0.40–1.20)
GFR: 89.83 mL/min (ref >60.00)
Glucose, Bld: 87 mg/dL (ref 70–99)
Potassium: 3.5 mEq/L (ref 3.5–5.1)
Sodium: 141 mEq/L (ref 135–145)
Total Bilirubin: 0.2 mg/dL (ref 0.2–1.2)
Total Protein: 6.8 g/dL (ref 6.0–8.3)

## 2021-12-09 LAB — SEDIMENTATION RATE: Sed Rate: 20 mm/hr (ref 0–30)

## 2021-12-09 LAB — LIPASE: Lipase: 45 U/L (ref 11.0–59.0)

## 2021-12-09 MED ORDER — SENNOSIDES-DOCUSATE SODIUM 8.6-50 MG PO TABS: 1.0000 | ORAL_TABLET | Freq: Every day | ORAL | Status: AC

## 2021-12-09 MED ORDER — SUCRALFATE 1 GM/10ML PO SUSP: 1.0000 g | Freq: Three times a day (TID) | ORAL | 1 refills | Status: DC

## 2021-12-09 MED ORDER — LINACLOTIDE 145 MCG PO CAPS: 145.0000 ug | ORAL_CAPSULE | Freq: Every day | ORAL | 1 refills | Status: DC

## 2021-12-09 MED ORDER — HYDROCORTISONE ACETATE 25 MG RE SUPP: 25.0000 mg | Freq: Every evening | RECTAL | 6 refills | Status: DC | PRN

## 2021-12-09 MED ORDER — ONDANSETRON HCL 4 MG PO TABS: 4.0000 mg | ORAL_TABLET | Freq: Four times a day (QID) | ORAL | 5 refills | Status: DC | PRN

## 2021-12-09 MED ORDER — RECTICARE ADVANCED 5-0.25-17-39 % EX CREA: TOPICAL_CREAM | CUTANEOUS | Status: AC

## 2021-12-09 MED ORDER — PANTOPRAZOLE SODIUM 40 MG PO TBEC: DELAYED_RELEASE_TABLET | ORAL | 1 refills | Status: DC

## 2021-12-09 NOTE — Patient Instructions (Addendum)
If you are age 64 or older, your body mass index should be between 23-30. Your Body mass index is 18.98 kg/m. If this is out of the aforementioned range listed, please consider follow up with your Primary Care Provider.  If you are age 83 or younger, your body mass index should be between 19-25. Your Body mass index is 18.98 kg/m. If this is out of the aformentioned range listed, please consider follow up with your Primary Care Provider.   ________________________________________________________   Please go to the lab in the basement of our building to have lab work done as you leave today. Hit "B" for basement when you get on the elevator.  When the doors open the lab is on your left.  We will call you with the results. Thank you. ____________________________________________________  Dennis Bast have been given a testing kit to check for small intestine bacterial overgrowth (SIBO) which is completed by a company named [facility]. Make sure to return your test in the mail using the return mailing label given to you along with the kit. Your demographic and insurance information have already been sent to the company and they should be in contact with you over the next 1-2 weeks regarding this test. [facility] will collect an upfront charge of $99.74 for commercial insurance plans and $209.74 is you are paying cash. Make sure to discuss with [facility] PRIOR to having the test to see if they have gotten information from your insurance company as to how much your testing will cost out of pocket, if any. Please keep in mind that you will be getting a call from phone number [phone] or a similar number. If you do not hear from them within this time frame, please call our office at [phone] or call [facility] directly at [phone].   _____________________________________________________  We have sent the following medications to your pharmacy for you to pick up at your  convenience: Linzess: Take once daily 30 minutes before  a meal Zofran : every 6 hours as needed Protonix: every morning before breakfast Anusol suppositories: Daily at bedtime x 5 days (as needed) Carafate suspension:take 4 times a day before meals and at bedtime  Please purchase the following medications over the counter and take as directed: Senakot S: Take 1 to 2 tablets daily at bedtime Recticare Advanced: three times a day as needed    Thank you for entrusting me with your care and for choosing [employer], Alfredia Ferguson, P.A.- C.

## 2021-12-09 NOTE — Progress Notes (Signed)
Subjective:    Patient ID: Ruth Terry, female    DOB: 16-May-1955, 66 y.o.   MRN: [id number]  HPI [name] is a pleasant 66 year old white female, established with Dr. Marina Goodell and also known to myself.  She was last seen in the office in July 2023 by Dr. Marina Goodell.  Just prior to that visit she had had a CT of the abdomen and pelvis showing gallbladder wall thickening and pericholecystic flat stranding trace pericholecystic fluid, and then subsequent ultrasound showed gallbladder wall thickening and stones or sludge.  She was referred to surgery and underwent laparoscopic cholecystectomy per Dr. Sophronia Simas 09/21/2021. Patient says she has felt poorly ever since surgery and if she had known that she was going to have all the problems she is having currently she never would have had that done.  She did fine with surgery, however had to return to the emergency room after discharged with abdominal pain, had elevated LFTs at that time and then underwent MRI/MRCP on 09/29/2021 that did show mild edema at the gallbladder fossa, no ductal dilation or choledocholithiasis. She was seen back by Dr. Freida Busman in October, had multiple GI complaints, had repeat LFTs which were unremarkable and was told to follow-up with GI. She had undergone EGD and colonoscopy in December 2022.  Colonoscopy showed a few diverticula in the sigmoid colon and internal hemorrhoids.  EGD was normal.  She says that over the past few months her GERD symptoms have been out of control which is really never been an issue for her in the past.  She had been taking Protonix once daily, when she called the office a few weeks ago she was advised to increase this to twice daily but that has not made any difference in her symptoms.  She has no dysphagia or odynophagia.  She is complaining of increased belching and gassiness as well as frequent nausea without vomiting.  She has discomfort in her upper abdomen she says radiates through into her back.   Appetite has not been good she generally does not feel well has lost some weight.  She is also concerned about mucousy appearing stools without melena or hematochezia. She has chronic constipation currently on Linzess 145 mcg daily which she says she is taking but is not always working well at this point even when she has a bowel movement in the morning she will frequently feel bloated and "backed up" as she previously had been used to having a couple of bowel movements per day. She had a recent urgent care visit for rectal pain/hemorrhoidal symptoms and was given Anusol suppositories.  She says whenever she has to strain or is constipated then she develops rectal discomfort/soreness. She is very worried about her current symptoms as she feels as if something "is not right" and wants an evaluation.  Review of Systems Pertinent positive and negative review of systems were noted in the above HPI section.  All other review of systems was otherwise negative.   Outpatient Encounter Medications as of 12/09/2021  Medication Sig   alfuzosin (UROXATRAL) 10 MG 24 hr tablet Take 1 tablet (10 mg total) by mouth daily with breakfast.   aspirin EC 81 MG tablet Take 1 tablet (81 mg total) by mouth daily. Swallow whole.   Cholecalciferol (VITAMIN D3 PO) Take 1 tablet by mouth daily.   docusate sodium (COLACE) 100 MG capsule Take 1 capsule (100 mg total) by mouth 2 (two) times daily.   erythromycin ophthalmic ointment Place 1 Application into  the right eye at bedtime.   ezetimibe (ZETIA) 10 MG tablet Take 1 tablet (10 mg total) by mouth daily.   famotidine (PEPCID) 20 MG tablet One after supper   fluticasone (FLONASE) 50 MCG/ACT nasal spray Place 1 spray into both nostrils daily as needed for allergies or rhinitis.   hydrocortisone (ANUSOL-HC) 25 MG suppository Place 1 suppository (25 mg total) rectally at bedtime as needed for hemorrhoids or anal itching. For 5 days   hyoscyamine (LEVSIN SL) 0.125 MG SL tablet  Place 1 tablet (0.125 mg total) under the tongue every 6 (six) hours as needed.   levocetirizine (XYZAL) 5 MG tablet Take 1 tablet (5 mg total) by mouth every evening. For allergies   Lido-PE-Mineral Oil-Petrolatum (RECTICARE ADVANCED) 5-0.25-17-39 % CREA Use three times daily as needed   nitroGLYCERIN (NITROSTAT) 0.4 MG SL tablet Place 1 tablet (0.4 mg total) under the tongue every 5 (five) minutes as needed for chest pain.   ofloxacin (OCUFLOX) 0.3 % ophthalmic solution Place 1 drop into the right eye See admin instructions. Instill 1 drop into the right eye every hour   psyllium (METAMUCIL) 58.6 % packet Take 1 packet by mouth every evening.   senna-docusate (SENOKOT S) 8.6-50 MG tablet Take 1-2 tablets by mouth at bedtime.   Simethicone (PHAZYME MAXIMUM STRENGTH) 250 MG CAPS Take 250 mg by mouth 3 (three) times daily. (Patient taking differently: Take 250 mg by mouth 3 (three) times daily as needed (gas relief).)   sucralfate (CARAFATE) 1 GM/10ML suspension Take 10 mLs (1 g total) by mouth 4 (four) times daily -  with meals and at bedtime.   valACYclovir (VALTREX) 1000 MG tablet TAKE 1 TABLET (1,000 MG TOTAL) BY MOUTH AT BEDTIME.   [DISCONTINUED] linaclotide (LINZESS) 145 MCG CAPS capsule Take 1 capsule (145 mcg total) by mouth daily before breakfast.   [DISCONTINUED] ondansetron (ZOFRAN) 4 MG tablet Take 1 tablet (4 mg total) by mouth every 8 (eight) hours as needed for nausea or vomiting.   [DISCONTINUED] pantoprazole (PROTONIX) 40 MG tablet Take 1 tablet (40 mg total) by mouth daily.   linaclotide (LINZESS) 145 MCG CAPS capsule Take 1 capsule (145 mcg total) by mouth daily before breakfast.   ondansetron (ZOFRAN) 4 MG tablet Take 1 tablet (4 mg total) by mouth every 6 (six) hours as needed for nausea or vomiting.   pantoprazole (PROTONIX) 40 MG tablet Take once a day before breakfast   [DISCONTINUED] AMBULATORY NON FORMULARY MEDICATION Medication Name: Domperidone 10 mg Take 1 tablet at  bedtime daily. (Patient taking differently: Take 1 tablet by mouth at bedtime. Medication Name: Domperidone 10 mg Take 1 tablet at bedtime daily.)   Facility-Administered Encounter Medications as of 12/09/2021  Medication   methylPREDNISolone acetate (DEPO-MEDROL) injection 40 mg   Allergies  Allergen Reactions   Chlordiazepoxide-Clidinium Itching    (Librax)   Ciprofloxacin Itching    Irritates stomach   Flagyl [Metronidazole]     Severe yeast infection   Sulfa Drugs Cross Reactors Nausea Only   Nitrofurantoin Monohyd Macro Rash   Omnicef [Cefdinir] Nausea And Vomiting   Patient Active Problem List   Diagnosis Date Noted   CAD (coronary artery disease) 07/22/2021   COPD GOLD ? / group B 05/12/2021   Exertional chest pain 05/12/2021   Former smoker 05/12/2021   Generalized abdominal pain 01/02/2021   History of shingles 04/24/2019   Chronic midline low back pain without sciatica 02/27/2019   Chronic idiopathic constipation 04/26/2018   Aortic atherosclerosis (HCC)  03/01/2017   Rib fracture 12/07/2016   Gas pain 03/09/2016   Anal itching 03/09/2016   Elevated LDL cholesterol level 10/24/2015   Internal hemorrhoids 05/14/2014   GERD (gastroesophageal reflux disease) 09/17/2011   IBS (irritable bowel syndrome) 08/25/2010   Gastroparesis 06/30/2010   Non-ulcer dyspepsia 04/03/2010   Social History   Socioeconomic History   Marital status: Married    Spouse name: Not on file   Number of children: 0   Years of education: Not on file   Highest education level: Not on file  Occupational History   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    Employer: [employer]  Tobacco Use   Smoking status: Some Days    Packs/day: 1.00    Years: 20.00    Total pack years: 20.00    Types: Cigarettes    Last attempt to quit: 05/2020    Years since quitting: 1.6   Smokeless tobacco: Never   Tobacco comments:    USE SMOKELESS CIGARETTES  Vaping  Use   Vaping Use: Some days   Substances: Nicotine  Substance and Sexual Activity   Alcohol use: Not Currently    Comment: 1 a week, wine   Drug use: No   Sexual activity: Not Currently  Other Topics Concern   Not on file  Social History Narrative   Not on file   Social Determinants of Health   Financial Resource Strain: Not on file  Food Insecurity: Not on file  Transportation Needs: Not on file  Physical Activity: Not on file  Stress: Not on file  Social Connections: Not on file  Intimate Partner Violence: Not on file    Ms. Pavek's family history includes Diabetes in her father; Heart attack in her father; Hypertension in her mother.      Objective:    Vitals:   12/09/21 1331  BP: 106/70  Pulse: 98  SpO2: 98%    Physical Exam Well-developed well-nourished older WF  in no acute distress.  Height, Weight,97  BMI 18.9  HEENT; nontraumatic normocephalic, EOMI, PE R LA, sclera anicteric. Oropharynx; not examined today Neck; supple, no JVD Cardiovascular; regular rate and rhythm with S1-S2, no murmur rub or gallop Pulmonary; Clear bilaterally Abdomen; soft, tender across the upper abdomen and has mild rather generalized tenderness no guarding or rebound nondistended, no palpable mass or hepatosplenomegaly, bowel sounds are active-healed incisional port scars Rectal; small external tags, tight sphincter, tender to exam, no stool in vault, no heme, no fissure palpable and no edematous hemorrhoids palpable Skin; benign exam, no jaundice rash or appreciable lesions Extremities; no clubbing cyanosis or edema skin warm and dry Neuro/Psych; alert and oriented x4, grossly nonfocal mood and affect appropriate        Assessment & Plan:   #27 66 year old white female with long history of IBS, nonulcer dyspepsia, and chronic constipation who underwent laparoscopic cholecystectomy for cholelithiasis and concerns for acute cholecystitis September 2019 and has had worsening of  multiple GI symptoms since that time. She did have abdominal pain after discharge from the hospital which brought her back to the emergency room.  She had  elevated LFTs with that episode and underwent MRI/MRCP on 09/29/2021 which was negative.  She has been released by surgery and referred back to GI. She continues to have ongoing upper abdominal discomfort frequently radiating into the back, nausea without vomiting, bloating heartburn indigestion and worse difficulty with her chronic constipation.  Doubt choledocholithiasis at this time  but will rule out Rule out bile gastritis Rule out exacerbation of SIBO  #2 intermittent rectal pain, previously documented small internal hemorrhoids-exam negative today for any edematous internal hemorrhoids or anal fissure She may have symptomatic internal hemorrhoids, and I suspect she may have proctalgia fugax symptoms with episodes of rectal pain/tightness  #3 COPD #4.  Coronary artery disease #5.  GERD, with exacerbation post laparoscopic cholecystectomy  #6 diverticulosis #7  Anxiety  Plan; CBC with differential, c-Met, lipase, sed rate We will go back to Protonix 40 mg once daily 1 p.o. every morning AC breakfast Start Carafate suspension 1 g between meals and at bedtime Refill Zofran 4 mg every 6 hours as needed for nausea Refill Levsin sublingual every 6 hours as needed abdominal pain/spasm and also advise she give this a trial when she has rectal pain episodes. Refill Anusol HC suppositories, advised using nightly x5 days when symptomatic, then repeat course as needed Also recommended RectiCare advanced which contains lidocaine which she can use externally and apply 1 inch into the anus for rectal discomfort/burning  Start Senokot S 1-2 at bedtime Advised she could increase dose of Linzess to 290 mcg daily, or start with Linzess 145 mcg daily alternating with 290 micrograms daily  Will hold on imaging until labs are reviewed, if LFTs are  elevated then likely will need MRI/MRCP, if labs are unremarkable may pursue CT of the abdomen and pelvis with contrast All of the above negative, may retreat with Xifaxan 550 3 times daily x14 days for SIBO.     Plan    Emberlynn Riggan Oswald Hillock PA-C 12/09/2021   Cc: Rebekah Chesterfield, NP

## 2021-12-09 NOTE — Progress Notes (Signed)
Thank you [name]!  ................

## 2021-12-11 ENCOUNTER — Telehealth: Payer: Self-pay | Admitting: Physician Assistant

## 2021-12-11 NOTE — Telephone Encounter (Signed)
Inbound call from patient requesting a call back. Patient stated she had a terrible night and is wanting to discuss with the nurse. Please advise.

## 2021-12-11 NOTE — Telephone Encounter (Signed)
Patient was seen 12/09/21 by Nicoletta Ba, PA for complaints of GERD symptoms. She calls today with complaints of continuing hurting and burning of her chest and back. She describes a "sick bile taste" in her mouth. She has taken her Protonix this morning. Her last dose of Carafate was around 11 am. The patient is asking can she also take Gas-X or Pepcid? She inquires about her labs because she has seen them on her My Chart. She is asking if she can have the CT as discussed in her office visit.  Please, would you review this and advise in [name]'s absence?

## 2021-12-11 NOTE — Telephone Encounter (Signed)
1.  All labs are normal 2.  No need for CT.  Just had MRI of the abdomen in September.  No abnormalities. 3.  Okay to take Gas-X and/or Pepcid 4.  Make a follow-up appointment with [name]

## 2021-12-13 NOTE — Progress Notes (Unsigned)
Cardiology Office Note:    Date:  12/14/2021   ID:  Ruth Terry, DOB [birth year], MRN [id number]  PCP:  Rebekah Chesterfield, NP  Cardiologist:  Lesleigh Noe, MD   Referring MD: Rebekah Chesterfield, NP   Chief Complaint  Patient presents with   Coronary Artery Disease   Hyperlipidemia    History of Present Illness:    Ruth Terry is a 66 y.o. female with a hx of exertional chest pain. Prior h/o MVP, elevated lipids, tobacco use, and family h/o CAD.  Since last seen, cholecystectomy has been performed at [facility].  After she eats she gets a sensation of burning discomfort in the substernal area that gets better with Carafate.  She denies exertional related chest discomfort.  She denies shortness of breath.  She had intolerance of rosuvastatin 10 mg Monday Wednesday and Friday.  She did not qualify for the cardiac rehab program at [facility] because she does not have significant coronary disease or documented ischemia.  Past Medical History:  Diagnosis Date   Allergy    Anal fissure    Cataract    Chronic constipation    Coronary artery disease    Diverticulosis 04/20/2010   colonoscopy   Gastroparesis    GERD (gastroesophageal reflux disease)    Hemorrhoids    Hyperlipidemia    IBS (irritable bowel syndrome)    MVP (mitral valve prolapse)    as a teenager per patient, no issues since   Osteoporosis    Polyp, stomach 04/20/2010   egd   Shingles    UTI (lower urinary tract infection)     Past Surgical History:  Procedure Laterality Date   CHOLECYSTECTOMY N/A 09/21/2021   Procedure: LAPAROSCOPIC CHOLECYSTECTOMY;  Surgeon: Fritzi Mandes, MD;  Location: [facility] OR;  Service: General;  Laterality: N/A;   COLONOSCOPY     ESOPHAGOGASTRODUODENOSCOPY     INTRAVASCULAR PRESSURE WIRE/FFR STUDY N/A 07/22/2021   Procedure: INTRAVASCULAR PRESSURE WIRE/FFR STUDY;  Surgeon: Lyn Records, MD;  Location: [facility] INVASIVE CV LAB;  Service: Cardiovascular;   Laterality: N/A;   LEFT HEART CATH AND CORONARY ANGIOGRAPHY N/A 07/22/2021   Procedure: LEFT HEART CATH AND CORONARY ANGIOGRAPHY;  Surgeon: Lyn Records, MD;  Location: [facility] INVASIVE CV LAB;  Service: Cardiovascular;  Laterality: N/A;    Current Medications: Current Meds  Medication Sig   alfuzosin (UROXATRAL) 10 MG 24 hr tablet Take 1 tablet (10 mg total) by mouth daily with breakfast.   aspirin EC 81 MG tablet Take 1 tablet (81 mg total) by mouth daily. Swallow whole.   Cholecalciferol (VITAMIN D3 PO) Take 1 tablet by mouth daily.   docusate sodium (COLACE) 100 MG capsule Take 1 capsule (100 mg total) by mouth 2 (two) times daily.   erythromycin ophthalmic ointment Place 1 Application into the right eye at bedtime.   ezetimibe (ZETIA) 10 MG tablet Take 1 tablet (10 mg total) by mouth daily.   famotidine (PEPCID) 20 MG tablet One after supper   fluticasone (FLONASE) 50 MCG/ACT nasal spray Place 1 spray into both nostrils daily as needed for allergies or rhinitis.   hydrocortisone (ANUSOL-HC) 25 MG suppository Place 1 suppository (25 mg total) rectally at bedtime as needed for hemorrhoids or anal itching. For 5 days   hyoscyamine (LEVSIN SL) 0.125 MG SL tablet Place 1 tablet (0.125 mg total) under the tongue every 6 (six) hours as needed.   levocetirizine (XYZAL) 5 MG tablet Take 1 tablet (  5 mg total) by mouth every evening. For allergies   Lido-PE-Mineral Oil-Petrolatum (RECTICARE ADVANCED) 5-0.25-17-39 % CREA Use three times daily as needed   linaclotide (LINZESS) 145 MCG CAPS capsule Take 1 capsule (145 mcg total) by mouth daily before breakfast.   nitroGLYCERIN (NITROSTAT) 0.4 MG SL tablet Place 1 tablet (0.4 mg total) under the tongue every 5 (five) minutes as needed for chest pain.   ofloxacin (OCUFLOX) 0.3 % ophthalmic solution Place 1 drop into the right eye See admin instructions. Instill 1 drop into the right eye every hour   ondansetron (ZOFRAN) 4 MG tablet Take 1 tablet (4 mg total)  by mouth every 6 (six) hours as needed for nausea or vomiting.   pantoprazole (PROTONIX) 40 MG tablet Take once a day before breakfast   pravastatin (PRAVACHOL) 10 MG tablet Take 1 tablet (10 mg total) by mouth 3 (three) times a week. Take on Mondays, Wednesdays, and Fridays.   psyllium (METAMUCIL) 58.6 % packet Take 1 packet by mouth every evening.   senna-docusate (SENOKOT S) 8.6-50 MG tablet Take 1-2 tablets by mouth at bedtime.   Simethicone (PHAZYME MAXIMUM STRENGTH) 250 MG CAPS Take 250 mg by mouth 3 (three) times daily. (Patient taking differently: Take 250 mg by mouth 3 (three) times daily as needed (gas relief).)   sucralfate (CARAFATE) 1 GM/10ML suspension Take 10 mLs (1 g total) by mouth 4 (four) times daily -  with meals and at bedtime.   valACYclovir (VALTREX) 1000 MG tablet TAKE 1 TABLET (1,000 MG TOTAL) BY MOUTH AT BEDTIME.   Current Facility-Administered Medications for the 12/14/21 encounter (Office Visit) with Lyn Records, MD  Medication   methylPREDNISolone acetate (DEPO-MEDROL) injection 40 mg     Allergies:   Chlordiazepoxide-clidinium, Ciprofloxacin, Flagyl [metronidazole], Sulfa drugs cross reactors, Nitrofurantoin monohyd macro, and Omnicef [cefdinir]   Social History   Socioeconomic History   Marital status: Married    Spouse name: Not on file   Number of children: 0   Years of education: Not on file   Highest education level: Not on file  Occupational History   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    Employer: [employer]  Tobacco Use   Smoking status: Some Days    Packs/day: 1.00    Years: 20.00    Total pack years: 20.00    Types: Cigarettes    Last attempt to quit: 05/2020    Years since quitting: 1.6   Smokeless tobacco: Never   Tobacco comments:    USE SMOKELESS CIGARETTES  Vaping Use   Vaping Use: Some days   Substances: Nicotine  Substance and Sexual Activity   Alcohol use: Not Currently     Comment: 1 a week, wine   Drug use: No   Sexual activity: Not Currently  Other Topics Concern   Not on file  Social History Narrative   Not on file   Social Determinants of Health   Financial Resource Strain: Not on file  Food Insecurity: Not on file  Transportation Needs: Not on file  Physical Activity: Not on file  Stress: Not on file  Social Connections: Not on file     Family History: The patient's family history includes Diabetes in her father; Heart attack in her father; Hypertension in her mother. There is no history of Colon cancer, Esophageal cancer, or Rectal cancer.  ROS:   Please see the history of present illness.    Not exercising regularly.  Recuperating from cholecystectomy.  All other systems reviewed and are negative.  EKGs/Labs/Other Studies Reviewed:    The following studies were reviewed today: CORONARY ANGIOGRAPHY 07/22/2021: Diagnostic Dominance: Right  Cath 07/22/21 INTRAVASCULAR PRESSURE WIRE/FFR STUDY  LEFT HEART CATH AND CORONARY ANGIOGRAPHY    Conclusion   CONCLUSIONS: Widely patent left main Proximal calcified LAD 20% stenosis. Widely patent circumflex with proximal to mid 30% plaque. RCA with 30% proximal and 50 to 70% mid within an angulation.  RFR beyond the mid stenosis was 0.91 (abnormal </= 0.89). Normal left ventricular function with EF 65%.  EDP is normal.   RECOMMENDATIONS: Smoking. LDL less than 55.  Has only been on statins for 7 days and is complaining of leg discomfort.  Would decrease to Monday, Wednesday, and Friday dosing (10 mg tablets). Exercise and consider cardiac rehab.   EKG:  EKG not repeated today.  Recent Labs: 12/09/2021: ALT 8; BUN 5; Creatinine, Ser 0.71; Hemoglobin 13.0; Platelets 203.0; Potassium 3.5; Sodium 141  Recent Lipid Panel    Component Value Date/Time   CHOL 235 (H) 08/06/2019 0955   TRIG 133 08/06/2019 0955   HDL 72 08/06/2019 0955   CHOLHDL 3.3 08/06/2019 0955   LDLCALC 140 (H) 08/06/2019  0955    Physical Exam:    VS:  BP 108/70   Ht 5' (1.524 m)   Wt 97 lb 6.4 oz (44.2 kg)   SpO2 98%   BMI 19.02 kg/m     Wt Readings from Last 3 Encounters:  12/14/21 97 lb 6.4 oz (44.2 kg)  12/09/21 97 lb 3.2 oz (44.1 kg)  09/27/21 105 lb (47.6 kg)     GEN: Slender. No acute distress HEENT: Normal NECK: No JVD. LYMPHATICS: No lymphadenopathy CARDIAC: No murmur. RRR no gallop, or edema. VASCULAR:  Normal Pulses. No bruits. RESPIRATORY:  Clear to auscultation without rales, wheezing or rhonchi  ABDOMEN: Soft, non-tender, non-distended, No pulsatile mass, MUSCULOSKELETAL: No deformity  SKIN: Warm and dry NEUROLOGIC:  Alert and oriented x 3 PSYCHIATRIC:  Normal affect   ASSESSMENT:    1. CAD in native artery   2. Hyperlipidemia LDL goal <70    PLAN:    In order of problems listed above:  Asymptomatic.  Secondary prevention reviewed. Missed follow-up in the lipid clinic.  Have decided to start pravastatin 10 mg Monday, Wednesday, and Friday.  Continue ezetimibe and have liver and lipid panel performed at the laboratory at [facility] in 6 weeks.  Follow-up with Dr. Shari Prows in 6 months.  Started pravastatin 10 mg Monday, Wednesday, and Friday.  Liver and lipid panel in 6 to 8 weeks.   Medication Adjustments/Labs and Tests Ordered: Current medicines are reviewed at length with the patient today.  Concerns regarding medicines are outlined above.  Orders Placed This Encounter  Procedures   Lipid panel   Hepatic function panel   Meds ordered this encounter  Medications   pravastatin (PRAVACHOL) 10 MG tablet    Sig: Take 1 tablet (10 mg total) by mouth 3 (three) times a week. Take on Mondays, Wednesdays, and Fridays.    Dispense:  35 tablet    Refill:  3    Patient Instructions  Medication Instructions:  Your physician has recommended you make the following change in your medication:   1) START pravastatin (Pravachol) 10mg  on Mondays, Wednesdays, and  Fridays  *If you need a refill on your cardiac medications before your next appointment, please call your pharmacy*  Lab Work: In 6-8 weeks: fasting  Lipid panel, LFTs [facility] outpatient lab) If you have labs (blood work) drawn today and your tests are completely normal, you will receive your results only by: MyChart Message (if you have MyChart) OR A paper copy in the mail If you have any lab test that is abnormal or we need to change your treatment, we will call you to review the results.  Follow-Up: At [facility], you and your health needs are our priority.  As part of our continuing mission to provide you with exceptional heart care, we have created designated Provider Care Teams.  These Care Teams include your primary Cardiologist (physician) and Advanced Practice Providers (APPs -  Physician Assistants and Nurse Practitioners) who all work together to provide you with the care you need, when you need it.  Your next appointment:   6 month(s)  The format for your next appointment:   In Person  Provider:   Laurance Flatten, MD   Important Information About Sugar         Signed, Lesleigh Noe, MD  12/14/2021 1:44 PM    [facility]

## 2021-12-14 ENCOUNTER — Encounter: Payer: Self-pay | Admitting: Interventional Cardiology

## 2021-12-14 ENCOUNTER — Other Ambulatory Visit: Payer: Self-pay

## 2021-12-14 ENCOUNTER — Ambulatory Visit: Payer: BC Managed Care – PPO | Attending: Interventional Cardiology | Admitting: Interventional Cardiology

## 2021-12-14 VITALS — BP 108/70 | Ht 60.0 in | Wt 97.4 lb

## 2021-12-14 DIAGNOSIS — E785 Hyperlipidemia, unspecified: Secondary | ICD-10-CM | POA: Diagnosis not present

## 2021-12-14 DIAGNOSIS — E7800 Pure hypercholesterolemia, unspecified: Secondary | ICD-10-CM

## 2021-12-14 DIAGNOSIS — I2510 Atherosclerotic heart disease of native coronary artery without angina pectoris: Secondary | ICD-10-CM | POA: Diagnosis not present

## 2021-12-14 DIAGNOSIS — J449 Chronic obstructive pulmonary disease, unspecified: Secondary | ICD-10-CM

## 2021-12-14 DIAGNOSIS — K6289 Other specified diseases of anus and rectum: Secondary | ICD-10-CM

## 2021-12-14 DIAGNOSIS — R109 Unspecified abdominal pain: Secondary | ICD-10-CM

## 2021-12-14 DIAGNOSIS — K219 Gastro-esophageal reflux disease without esophagitis: Secondary | ICD-10-CM

## 2021-12-14 MED ORDER — PRAVASTATIN SODIUM 10 MG PO TABS: 10.0000 mg | ORAL_TABLET | ORAL | 3 refills | Status: AC

## 2021-12-14 NOTE — Patient Instructions (Addendum)
Medication Instructions:  Your physician has recommended you make the following change in your medication:   1) START pravastatin (Pravachol) '10mg'$  on Mondays, Wednesdays, and Fridays  *If you need a refill on your cardiac medications before your next appointment, please call your pharmacy*  Lab Work: In 6-8 weeks: fasting Lipid panel, LFTs [facility] outpatient lab) If you have labs (blood work) drawn today and your tests are completely normal, you will receive your results only by: [place] (if you have MyChart) OR A paper copy in the mail If you have any lab test that is abnormal or we need to change your treatment, we will call you to review the results.  Follow-Up: At [facility], you and your health needs are our priority.  As part of our continuing mission to provide you with exceptional heart care, we have created designated Provider Care Teams.  These Care Teams include your primary Cardiologist (physician) and Advanced Practice Providers (APPs -  Physician Assistants and Nurse Practitioners) who all work together to provide you with the care you need, when you need it.  Your next appointment:   6 month(s)  The format for your next appointment:   In Person  Provider:   Gwyndolyn Kaufman, MD   Important Information About Sugar

## 2021-12-15 ENCOUNTER — Other Ambulatory Visit: Payer: Self-pay | Admitting: Physician Assistant

## 2021-12-15 NOTE — Telephone Encounter (Signed)
Patient is aware. Refill through her pharmacy.

## 2021-12-15 NOTE — Telephone Encounter (Signed)
Patient requesting call from nurse, also would like a refill on carafate. Please advise.

## 2021-12-21 ENCOUNTER — Telehealth: Payer: Self-pay | Admitting: Physician Assistant

## 2021-12-21 ENCOUNTER — Other Ambulatory Visit: Payer: Self-pay

## 2021-12-21 DIAGNOSIS — H10013 Acute follicular conjunctivitis, bilateral: Secondary | ICD-10-CM | POA: Diagnosis not present

## 2021-12-21 MED ORDER — SUCRALFATE 1 GM/10ML PO SUSP: 1.0000 g | Freq: Three times a day (TID) | ORAL | 0 refills | Status: DC

## 2021-12-21 NOTE — Telephone Encounter (Signed)
Patient is calling wishing to speak with Joey. Please advise

## 2021-12-21 NOTE — Telephone Encounter (Signed)
Patient stated that she needs a refill Carafate and would like to discuss the abdominal cramps and pain she has had for the last 24 hours. Please advise.

## 2021-12-22 ENCOUNTER — Telehealth: Payer: Self-pay | Admitting: Physician Assistant

## 2021-12-22 ENCOUNTER — Other Ambulatory Visit: Payer: Self-pay

## 2021-12-22 MED ORDER — HYOSCYAMINE SULFATE 0.125 MG SL SUBL: 0.1250 mg | SUBLINGUAL_TABLET | Freq: Four times a day (QID) | SUBLINGUAL | 0 refills | Status: DC | PRN

## 2021-12-22 NOTE — Telephone Encounter (Signed)
Patient is calling states she would like to speak with Eustaquio Maize says she's having bad cramps. Please advise

## 2021-12-22 NOTE — Telephone Encounter (Signed)
Spoke with the patient. She does not feel any improvement in her abdominal cramps or her "center of my chest" reflux since taking the Carafate. She states it has relieved her symptoms "maybe by 15%" and she is going to stop it for now. She complains of abdominal cramps, incomplete bowel movements and gassiness. She is taking Linzess and Senokot for the constipation.  Ruth Terry also is planning on paying for her CT out of pocket. Her insurance will not cover it and she feels it is very important to have it done , stating "something is wrong in there."  She asks what can she do about the gassiness, the abdominal cramps, and the constipation.

## 2021-12-22 NOTE — Telephone Encounter (Signed)
Patient is following up awaiting phone call from [facility].

## 2021-12-23 NOTE — Telephone Encounter (Signed)
Spoke with patient. Agrees with this plan. She will take Linzess 290 mcg daily until bowels are emptying well. She has 4 of this strength left. She will then began alternating Linzess 290 mcg with Linzess 145 mcg. She has stopped the Carafate. She will have a CT A/P 12/24/21 at [facility]. She hopes there will be results by 12/25/21.

## 2021-12-24 ENCOUNTER — Ambulatory Visit (HOSPITAL_COMMUNITY)
Admission: RE | Admit: 2021-12-24 | Discharge: 2021-12-24 | Disposition: A | Discharge status: Home / Self Care | Payer: BC Managed Care – PPO | Source: Ambulatory Visit | Attending: Physician Assistant | Admitting: Physician Assistant

## 2021-12-24 DIAGNOSIS — K219 Gastro-esophageal reflux disease without esophagitis: Secondary | ICD-10-CM | POA: Insufficient documentation

## 2021-12-24 DIAGNOSIS — R109 Unspecified abdominal pain: Secondary | ICD-10-CM | POA: Diagnosis not present

## 2021-12-24 DIAGNOSIS — I700 Atherosclerosis of aorta: Secondary | ICD-10-CM | POA: Diagnosis not present

## 2021-12-24 DIAGNOSIS — K6289 Other specified diseases of anus and rectum: Secondary | ICD-10-CM | POA: Insufficient documentation

## 2021-12-24 MED ORDER — IOHEXOL 300 MG/ML  SOLN
75.0000 mL | Freq: Once | INTRAMUSCULAR | Status: AC | PRN
  Administered 2021-12-24: 75 mL via INTRAVENOUS

## 2021-12-24 MED ORDER — IOHEXOL 9 MG/ML PO SOLN
ORAL | Status: AC
  Filled 2021-12-24: qty 500

## 2021-12-29 ENCOUNTER — Telehealth: Payer: Self-pay | Admitting: Physician Assistant

## 2021-12-29 NOTE — Telephone Encounter (Signed)
Inbound call from patient wanting to discuss the results of her CT scan as well as medication. Please advise. Thank you

## 2021-12-30 NOTE — Telephone Encounter (Signed)
Advised patient of her normal CT results. States she is pleased the results are normal however, "something is wrong, I don't feel good." Reports taking Linzess 290 mcg did not help; she does not feel "emptied." Stools are diarrhea or soft with "hard things in it which tells me I am constipated."  States "my stomach knots and rolls and I am gassy." Reports "bad taste " in her mouth. "My quality of life is affected." Denies any stress that she would blame her symptoms on.  Patient asks if she should have stool tests, or if this could be SIBO. Wants a regimen for her bowels.

## 2021-12-31 ENCOUNTER — Other Ambulatory Visit: Payer: Self-pay

## 2021-12-31 NOTE — Telephone Encounter (Signed)
Patient agrees to this plan. However, she does not know what Miracle Mouthwash will do for her.

## 2022-01-01 ENCOUNTER — Ambulatory Visit (HOSPITAL_COMMUNITY): Payer: BC Managed Care – PPO

## 2022-01-01 ENCOUNTER — Encounter (HOSPITAL_COMMUNITY): Payer: BC Managed Care – PPO

## 2022-01-01 DIAGNOSIS — H10013 Acute follicular conjunctivitis, bilateral: Secondary | ICD-10-CM | POA: Diagnosis not present

## 2022-01-01 DIAGNOSIS — T1511XA Foreign body in conjunctival sac, right eye, initial encounter: Secondary | ICD-10-CM | POA: Diagnosis not present

## 2022-01-05 NOTE — Telephone Encounter (Signed)
SIBO test kit of [employer] at the front desk 3rd floor for patient pick up.

## 2022-01-07 ENCOUNTER — Other Ambulatory Visit (HOSPITAL_COMMUNITY): Payer: Self-pay

## 2022-01-07 ENCOUNTER — Ambulatory Visit: Payer: BC Managed Care – PPO | Admitting: Internal Medicine

## 2022-01-10 DIAGNOSIS — K644 Residual hemorrhoidal skin tags: Secondary | ICD-10-CM | POA: Diagnosis not present

## 2022-01-10 DIAGNOSIS — R350 Frequency of micturition: Secondary | ICD-10-CM | POA: Diagnosis not present

## 2022-01-19 ENCOUNTER — Other Ambulatory Visit: Payer: BC Managed Care – PPO

## 2022-01-27 ENCOUNTER — Other Ambulatory Visit: Payer: Self-pay | Admitting: Urology

## 2022-01-27 ENCOUNTER — Ambulatory Visit (INDEPENDENT_AMBULATORY_CARE_PROVIDER_SITE_OTHER): Payer: BC Managed Care – PPO | Admitting: Urology

## 2022-01-27 DIAGNOSIS — N390 Urinary tract infection, site not specified: Secondary | ICD-10-CM

## 2022-01-27 DIAGNOSIS — N3000 Acute cystitis without hematuria: Secondary | ICD-10-CM

## 2022-01-27 MED ORDER — DOXYCYCLINE HYCLATE 100 MG PO CAPS: 100.0000 mg | ORAL_CAPSULE | Freq: Two times a day (BID) | ORAL | 0 refills | Status: DC

## 2022-01-27 NOTE — Progress Notes (Signed)
Urine sent for ua and culture.  MD started treatment pt aware we will call with culture results.

## 2022-01-28 ENCOUNTER — Ambulatory Visit
Admission: RE | Admit: 2022-01-28 | Discharge: 2022-01-28 | Disposition: A | Discharge status: Home / Self Care | Payer: BC Managed Care – PPO | Source: Ambulatory Visit

## 2022-01-28 VITALS — BP 137/80 | HR 102 | Temp 97.3°F | Resp 18

## 2022-01-28 DIAGNOSIS — J3089 Other allergic rhinitis: Secondary | ICD-10-CM | POA: Diagnosis not present

## 2022-01-28 DIAGNOSIS — K649 Unspecified hemorrhoids: Secondary | ICD-10-CM

## 2022-01-28 DIAGNOSIS — H60501 Unspecified acute noninfective otitis externa, right ear: Secondary | ICD-10-CM | POA: Diagnosis not present

## 2022-01-28 LAB — URINALYSIS, ROUTINE W REFLEX MICROSCOPIC
Bilirubin, UA: NEGATIVE
Glucose, UA: NEGATIVE
Ketones, UA: NEGATIVE
Leukocytes,UA: NEGATIVE
Nitrite, UA: NEGATIVE
Protein,UA: NEGATIVE
RBC, UA: NEGATIVE
Specific Gravity, UA: 1.01 (ref 1.005–1.030)
Urobilinogen, Ur: 0.2 mg/dL (ref 0.2–1.0)
pH, UA: 5.5 (ref 5.0–7.5)

## 2022-01-28 MED ORDER — NEOMYCIN-POLYMYXIN-HC 3.5-10000-1 OT SUSP: 4.0000 [drp] | Freq: Four times a day (QID) | OTIC | 0 refills | Status: AC

## 2022-01-28 NOTE — Discharge Instructions (Addendum)
As we discussed, you have infection in the skin in your right ear canal.  Start the cortisporin drops to treat this.  Make sure to use good ear precautions and avoid getting any water in your ear until it improves.    Start an oral antihistamine like Zyrtec to help with the itchy/watery eyes and itchy nose.   The hemorrhoid today does not appear thrombosed.  Continue the hydrocortisone suppositories, Metamucil, and hemorrhoid cream.

## 2022-01-28 NOTE — ED Provider Notes (Signed)
[facility]    CSN: [id number] Arrival date & time: 01/28/22  1115      History   Chief Complaint Chief Complaint  Patient presents with   Ear Fullness    Entered by patient   Appointment    [place]    HPI Ruth Terry is a 67 y.o. female.   Patient presents today for itchy eyes and right ear swelling and pain for the past couple of days.  She reports she saw her urologist earlier this week and was started on doxycycline for possible UTI.  Also reports she is concerned about a hemorrhoid.  She denies fever, cough, shortness of breath or chest pain, chest or nasal congestion, runny nose, postnasal drainage, headache, tooth pain, eye drainage or crusting, nausea, vomiting, diarrhea, and decreased appetite.  Reports she has been sneezing more than normal, her ears both feel like they have pressure behind them, and her eyes have been itchy and watery.  Reports she does wear contacts.  She takes Flonase daily.  Hearing has been normal per her report.  Reports the hemorrhoid has been present for the past day.  Reports she has a history of hemorrhoids, but she wants me to check it today and make sure it is not thrombosed.  Has started using her hydrocortisone suppository and cream as prescribed by gastroenterology already.  No blood in the stool.     Past Medical History:  Diagnosis Date   Allergy    Anal fissure    Cataract    Chronic constipation    Coronary artery disease    Diverticulosis 04/20/2010   colonoscopy   Gastroparesis    GERD (gastroesophageal reflux disease)    Hemorrhoids    Hyperlipidemia    IBS (irritable bowel syndrome)    MVP (mitral valve prolapse)    as a teenager per patient, no issues since   Osteoporosis    Polyp, stomach 04/20/2010   egd   Shingles    UTI (lower urinary tract infection)     Patient Active Problem List   Diagnosis Date Noted   CAD (coronary artery disease) 07/22/2021   COPD GOLD ? / group B 05/12/2021   Exertional  chest pain 05/12/2021   Former smoker 05/12/2021   Generalized abdominal pain 01/02/2021   History of shingles 04/24/2019   Chronic midline low back pain without sciatica 02/27/2019   Chronic idiopathic constipation 04/26/2018   Aortic atherosclerosis ([place]) 03/01/2017   Rib fracture 12/07/2016   Gas pain 03/09/2016   Anal itching 03/09/2016   Elevated LDL cholesterol level 10/24/2015   Internal hemorrhoids 05/14/2014   GERD (gastroesophageal reflux disease) 09/17/2011   IBS (irritable bowel syndrome) 08/25/2010   Gastroparesis 06/30/2010   Non-ulcer dyspepsia 04/03/2010    Past Surgical History:  Procedure Laterality Date   CHOLECYSTECTOMY N/A 09/21/2021   Procedure: LAPAROSCOPIC CHOLECYSTECTOMY;  Surgeon: Dwan Bolt, MD;  Location: [facility];  Service: General;  Laterality: N/A;   COLONOSCOPY     ESOPHAGOGASTRODUODENOSCOPY     INTRAVASCULAR PRESSURE WIRE/FFR STUDY N/A 07/22/2021   Procedure: INTRAVASCULAR PRESSURE WIRE/FFR STUDY;  Surgeon: Belva Crome, MD;  Location: [facility];  Service: Cardiovascular;  Laterality: N/A;   LEFT HEART CATH AND CORONARY ANGIOGRAPHY N/A 07/22/2021   Procedure: LEFT HEART CATH AND CORONARY ANGIOGRAPHY;  Surgeon: Belva Crome, MD;  Location: [facility];  Service: Cardiovascular;  Laterality: N/A;    OB History     Gravida  0  Para  0   Term  0   Preterm  0   AB  0   Living  0      SAB  0   IAB  0   Ectopic  0   Multiple  0   Live Births  0            Home Medications    Prior to Admission medications   Medication Sig Start Date End Date Taking? Authorizing Provider  DOXYCYCLINE PO Take by mouth.   Yes [provider]  neomycin-polymyxin-hydrocortisone (CORTISPORIN) 3.5-10000-1 OTIC suspension Place 4 drops into the right ear 4 (four) times daily for 7 days. 01/28/22 02/04/22 Yes Eulogio Bear, NP  alfuzosin (UROXATRAL) 10 MG 24 hr tablet Take 1 tablet (10 mg total) by mouth daily with  breakfast. 09/02/21   Summerlin, Berneice Heinrich, PA-C  aspirin EC 81 MG tablet Take 1 tablet (81 mg total) by mouth daily. Swallow whole. 07/17/21   Burnell Blanks, MD  Cholecalciferol (VITAMIN D3 PO) Take 1 tablet by mouth daily.    [provider]  docusate sodium (COLACE) 100 MG capsule Take 1 capsule (100 mg total) by mouth 2 (two) times daily. 09/02/21   Summerlin, Berneice Heinrich, PA-C  doxycycline (VIBRAMYCIN) 100 MG capsule Take 1 capsule (100 mg total) by mouth every 12 (twelve) hours. 01/27/22   McKenzie, Candee Furbish, MD  erythromycin ophthalmic ointment Place 1 Application into the right eye at bedtime. 09/14/21   [provider]  ezetimibe (ZETIA) 10 MG tablet Take 1 tablet (10 mg total) by mouth daily. 07/27/21   Belva Crome, MD  famotidine (PEPCID) 20 MG tablet One after supper 05/12/21   Tanda Rockers, MD  fluticasone [facility]) 50 MCG/ACT nasal spray Place 1 spray into both nostrils daily as needed for allergies or rhinitis.    [provider]  hydrocortisone (ANUSOL-HC) 25 MG suppository Place 1 suppository (25 mg total) rectally at bedtime as needed for hemorrhoids or anal itching. For 5 days 12/09/21   Esterwood, [name] S, PA-C  hyoscyamine (LEVSIN SL) 0.125 MG SL tablet Place 1 tablet (0.125 mg total) under the tongue every 6 (six) hours as needed. 12/22/21   Esterwood, [name] S, PA-C  levocetirizine (XYZAL) 5 MG tablet Take 1 tablet (5 mg total) by mouth every evening. For allergies 08/22/20   Ronnie Doss M, DO  Lido-PE-Mineral Oil-Petrolatum [facility] ADVANCED) 5-0.25-17-39 % CREA Use three times daily as needed 12/09/21   Esterwood, [name] S, PA-C  linaclotide (LINZESS) 145 MCG CAPS capsule Take 1 capsule (145 mcg total) by mouth daily before breakfast. 12/09/21   Esterwood, [name] S, PA-C  nitroGLYCERIN (NITROSTAT) 0.4 MG SL tablet Place 1 tablet (0.4 mg total) under the tongue every 5 (five) minutes as needed for chest pain. 07/17/21   Burnell Blanks, MD  ofloxacin (OCUFLOX) 0.3 % ophthalmic solution Place 1 drop into the right eye See admin instructions. Instill 1 drop into the right eye every hour 09/14/21   [provider]  ondansetron (ZOFRAN) 4 MG tablet Take 1 tablet (4 mg total) by mouth every 6 (six) hours as needed for nausea or vomiting. 12/09/21   Esterwood, [name] S, PA-C  pantoprazole (PROTONIX) 40 MG tablet Take once a day before breakfast 12/09/21   Esterwood, [name] S, PA-C  pravastatin (PRAVACHOL) 10 MG tablet Take 1 tablet (10 mg total) by mouth 3 (three) times a week. Take on Mondays, Wednesdays, and Fridays. 12/14/21  Belva Crome, MD  psyllium (METAMUCIL) 58.6 % packet Take 1 packet by mouth every evening.    [provider]  senna-docusate (SENOKOT S) 8.6-50 MG tablet Take 1-2 tablets by mouth at bedtime. 12/09/21   Esterwood, [name] S, PA-C  Simethicone (PHAZYME MAXIMUM STRENGTH) 250 MG CAPS Take 250 mg by mouth 3 (three) times daily. Patient taking differently: Take 250 mg by mouth 3 (three) times daily as needed (gas relief). 02/20/16   Terald Sleeper, PA-C  sucralfate (CARAFATE) 1 GM/10ML suspension Take 10 mLs (1 g total) by mouth 4 (four) times daily -  with meals and at bedtime. 12/21/21   Esterwood, [name] S, PA-C  valACYclovir (VALTREX) 1000 MG tablet TAKE 1 TABLET (1,000 MG TOTAL) BY MOUTH AT BEDTIME. 01/26/21   Hassell Done, Mary-Margaret, FNP  AMBULATORY NON FORMULARY MEDICATION Medication Name: Domperidone 10 mg Take 1 tablet at bedtime daily. Patient taking differently: Take 1 tablet by mouth at bedtime. Medication Name: Domperidone 10 mg Take 1 tablet at bedtime daily. 09/08/16 05/15/19  Esterwood, Glade Nurse, PA-C    Family History Family History  Problem Relation Age of Onset   Hypertension Mother    Diabetes Father    Heart attack Father        Age 72   Colon cancer Neg Hx    Esophageal cancer Neg Hx    Rectal cancer Neg Hx     Social History Social History   Tobacco Use   Smoking  status: Some Days    Packs/day: 1.00    Years: 20.00    Total pack years: 20.00    Types: Cigarettes    Last attempt to quit: 05/2020    Years since quitting: 1.7   Smokeless tobacco: Never   Tobacco comments:    USE SMOKELESS CIGARETTES  Vaping Use   Vaping Use: Some days   Substances: Nicotine  Substance Use Topics   Alcohol use: Not Currently    Comment: 1 a week, wine   Drug use: No     Allergies   Chlordiazepoxide-clidinium, Ciprofloxacin, Flagyl [metronidazole], Sulfa drugs cross reactors, Nitrofurantoin monohyd macro, and Omnicef [cefdinir]   Review of Systems Review of Systems Per HPI  Physical Exam Triage Vital Signs ED Triage Vitals  Enc Vitals Group     BP 01/28/22 1130 137/80     Pulse Rate 01/28/22 1130 (!) 102     Resp 01/28/22 1130 18     Temp 01/28/22 1130 (!) 97.3 F (36.3 C)     Temp Source 01/28/22 1130 Oral     SpO2 01/28/22 1130 99 %     Weight --      Height --      Head Circumference --      Peak Flow --      Pain Score 01/28/22 1134 7     Pain Loc --      Pain Edu? --      Excl. in [place]? --    No data found.  Updated Vital Signs BP 137/80 (BP Location: Right Arm)   Pulse (!) 102   Temp (!) 97.3 F (36.3 C) (Oral)   Resp 18   SpO2 99%   Visual Acuity Right Eye Distance:   Left Eye Distance:   Bilateral Distance:    Right Eye Near:   Left Eye Near:    Bilateral Near:     Physical Exam Vitals and nursing note reviewed. Exam conducted with a chaperone present (Patient declined chaperone).  Constitutional:      Appearance: Normal appearance.  HENT:     Head: Normocephalic and atraumatic.     Right Ear: Drainage, swelling and tenderness present. No middle ear effusion. Tympanic membrane is not erythematous or bulging.     Left Ear: No drainage, swelling or tenderness.  No middle ear effusion. Tympanic membrane is not erythematous or bulging.     Nose: Nose normal. No congestion or rhinorrhea.     Mouth/Throat:     Mouth:  Mucous membranes are moist.     Pharynx: Oropharynx is clear.  Pulmonary:     Effort: Pulmonary effort is normal. No respiratory distress.  Genitourinary:    Rectum: External hemorrhoid present.     Comments: External hemorrhoid noted to rectum; no bleeding or thrombosis appreciated Musculoskeletal:     Cervical back: Normal range of motion.  Lymphadenopathy:     Cervical: No cervical adenopathy.  Skin:    General: Skin is warm and dry.     Coloration: Skin is not jaundiced.     Findings: No erythema.  Neurological:     Mental Status: She is alert and oriented to person, place, and time.  Psychiatric:        Behavior: Behavior is cooperative.      UC Treatments / Results  Labs (all labs ordered are listed, but only abnormal results are displayed) Labs Reviewed - No data to display  EKG   Radiology No results found.  Procedures Procedures (including critical care time)  Medications Ordered in UC Medications - No data to display  Initial Impression / Assessment and Plan / UC Course  I have reviewed the triage vital signs and the nursing notes.  Pertinent labs & imaging results that were available during my care of the patient were reviewed by me and considered in my medical decision making (see chart for details).   Patient is well-appearing, normotensive, afebrile, not tachycardic, not tachypneic, oxygenating well on room air.    1. Acute otitis externa of right ear, unspecified type Treat with Cortisporin Ear precautions discussed Recommended follow-up with ENT with no improvement or worsening of symptoms despite treatment  2. Hemorrhoids, unspecified hemorrhoid type Reassurance provided No thrombosis identified today Continue supportive care as previously prescribed  3. Non-seasonal allergic rhinitis, unspecified trigger In addition to Flonase, begin oral antihistamine  The patient was given the opportunity to ask questions.  All questions answered to  their satisfaction.  The patient is in agreement to this plan.    Final Clinical Impressions(s) / UC Diagnoses   Final diagnoses:  Acute otitis externa of right ear, unspecified type  Hemorrhoids, unspecified hemorrhoid type  Non-seasonal allergic rhinitis, unspecified trigger     Discharge Instructions      As we discussed, you have infection in the skin in your right ear canal.  Start the cortisporin drops to treat this.  Make sure to use good ear precautions and avoid getting any water in your ear until it improves.    Start an oral antihistamine like Zyrtec to help with the itchy/watery eyes and itchy nose.   The hemorrhoid today does not appear thrombosed.  Continue the hydrocortisone suppositories, Metamucil, and hemorrhoid cream.       ED Prescriptions     Medication Sig Dispense Auth. Provider   neomycin-polymyxin-hydrocortisone (CORTISPORIN) 3.5-10000-1 OTIC suspension Place 4 drops into the right ear 4 (four) times daily for 7 days. 5.6 mL Eulogio Bear, NP      PDMP not  reviewed this encounter.   Eulogio Bear, NP 01/28/22 1626

## 2022-01-28 NOTE — ED Triage Notes (Signed)
Pt reports itching eyes and right ear swelling x 2 days. Pt reports she started doxycycline or UTI yesterday.

## 2022-02-01 ENCOUNTER — Telehealth: Payer: Self-pay

## 2022-02-01 NOTE — Telephone Encounter (Signed)
Patient states they doxycyline she is taking is really hard on her stomach.  She wants to know if she has to continue the rx or if you recommend an alternative.  She has several allergies to antibiotics listed in her chart.  Patient states she is not having any other symptoms of a UTI at this time and she was thinking she could just d/c this med until her culture comes back.  Please advise.

## 2022-02-02 ENCOUNTER — Other Ambulatory Visit: Payer: Self-pay

## 2022-02-02 ENCOUNTER — Telehealth: Payer: Self-pay

## 2022-02-02 DIAGNOSIS — N390 Urinary tract infection, site not specified: Secondary | ICD-10-CM

## 2022-02-02 NOTE — Telephone Encounter (Signed)
Verbal from Dr. Alyson Ingles that he strongly recommends patient complete the doxycyline.  Patient states she is feeling better and she does not wish to continue this because it is too hard on her stomach even when taking it with food.  Patient states she has IBS and the side effects of the doxycyline is too much on her stomach.  Patient's last ua was not sent for culture.  I informed patient and scheduled her for a repeat ua and culture for 01/30.  Patient aware that per MD we will defer treatment until her culture results come back.  Patient voiced understanding.

## 2022-02-02 NOTE — Telephone Encounter (Signed)
See other note

## 2022-02-03 ENCOUNTER — Ambulatory Visit (INDEPENDENT_AMBULATORY_CARE_PROVIDER_SITE_OTHER): Payer: BC Managed Care – PPO | Admitting: Urology

## 2022-02-03 DIAGNOSIS — R109 Unspecified abdominal pain: Secondary | ICD-10-CM | POA: Diagnosis not present

## 2022-02-03 DIAGNOSIS — M545 Low back pain, unspecified: Secondary | ICD-10-CM

## 2022-02-03 DIAGNOSIS — N390 Urinary tract infection, site not specified: Secondary | ICD-10-CM | POA: Diagnosis not present

## 2022-02-03 LAB — URINALYSIS, ROUTINE W REFLEX MICROSCOPIC
Bilirubin, UA: NEGATIVE
Glucose, UA: NEGATIVE
Ketones, UA: NEGATIVE
Leukocytes,UA: NEGATIVE
Nitrite, UA: NEGATIVE
Protein,UA: NEGATIVE
RBC, UA: NEGATIVE
Specific Gravity, UA: 1.01 (ref 1.005–1.030)
Urobilinogen, Ur: 0.2 mg/dL (ref 0.2–1.0)
pH, UA: 6 (ref 5.0–7.5)

## 2022-02-03 NOTE — Progress Notes (Signed)
Patient came in to repeat ua and culture.  Culture from 01/24 never sent.  Verbal from Dr. Alyson Ingles he will wait for culture results before he treats.  Patient had complaints of her lower back hurting and the pain radiating down.  Verbal from Dr. Alyson Ingles to order a renal US.  Imaging ordered and patient aware.

## 2022-02-06 LAB — URINE CULTURE

## 2022-02-08 ENCOUNTER — Telehealth: Payer: Self-pay

## 2022-02-08 DIAGNOSIS — N390 Urinary tract infection, site not specified: Secondary | ICD-10-CM

## 2022-02-08 MED ORDER — ONDANSETRON HCL 4 MG PO TABS: 4.0000 mg | ORAL_TABLET | Freq: Three times a day (TID) | ORAL | 0 refills | Status: DC | PRN

## 2022-02-08 MED ORDER — SULFAMETHOXAZOLE-TRIMETHOPRIM 800-160 MG PO TABS: 1.0000 | ORAL_TABLET | Freq: Two times a day (BID) | ORAL | 0 refills | Status: DC

## 2022-02-08 NOTE — Telephone Encounter (Signed)
Patient needing a call back for results from UA. Feeling sicker.  Please advise.  [phone]

## 2022-02-08 NOTE — Telephone Encounter (Signed)
Urine culture results reviewed with Dr. Alyson Ingles.  Bactrim Ds sent in along with Zofran to help with nausea. Patient called and made aware.

## 2022-02-09 ENCOUNTER — Ambulatory Visit (HOSPITAL_COMMUNITY)
Admission: RE | Admit: 2022-02-09 | Discharge: 2022-02-09 | Disposition: A | Discharge status: Home / Self Care | Payer: BC Managed Care – PPO | Source: Ambulatory Visit | Attending: Urology | Admitting: Urology

## 2022-02-09 DIAGNOSIS — R109 Unspecified abdominal pain: Secondary | ICD-10-CM | POA: Insufficient documentation

## 2022-02-22 ENCOUNTER — Telehealth: Payer: Self-pay

## 2022-02-22 NOTE — Telephone Encounter (Signed)
Patient is switching to Dr. Alyson Ingles, due to work schedule not available on Tuesdays.  Leaving a drop off urine specimen.  Due to UTI symptoms still present. Wants a culture done to make sure the staff infection has cleared up.

## 2022-02-23 ENCOUNTER — Ambulatory Visit: Payer: BC Managed Care – PPO | Admitting: Urology

## 2022-02-23 NOTE — Telephone Encounter (Signed)
Patient added to [facility] schedule for ua and culture on 02/21

## 2022-02-24 ENCOUNTER — Ambulatory Visit (INDEPENDENT_AMBULATORY_CARE_PROVIDER_SITE_OTHER): Payer: BC Managed Care – PPO | Admitting: Urology

## 2022-02-24 DIAGNOSIS — R399 Unspecified symptoms and signs involving the genitourinary system: Secondary | ICD-10-CM

## 2022-02-24 DIAGNOSIS — R109 Unspecified abdominal pain: Secondary | ICD-10-CM | POA: Diagnosis not present

## 2022-02-24 LAB — URINALYSIS, ROUTINE W REFLEX MICROSCOPIC
Bilirubin, UA: NEGATIVE
Glucose, UA: NEGATIVE
Ketones, UA: NEGATIVE
Leukocytes,UA: NEGATIVE
Nitrite, UA: NEGATIVE
Protein,UA: NEGATIVE
RBC, UA: NEGATIVE
Specific Gravity, UA: 1.02 (ref 1.005–1.030)
Urobilinogen, Ur: 0.2 mg/dL (ref 0.2–1.0)
pH, UA: 5.5 (ref 5.0–7.5)

## 2022-02-24 NOTE — Progress Notes (Cosign Needed Addendum)
Patient presents for a ua/ culture with complaints of UTI symptoms.  Dr. Alyson Ingles reviewed ua results and gave verbal recommendation to have patient come in for a cysto procedure.  We will call patient with culture results, no treatment needed at this time.  Apt scheduled with patient.

## 2022-02-26 LAB — URINE CULTURE: Organism ID, Bacteria: NO GROWTH

## 2022-03-01 ENCOUNTER — Telehealth: Payer: Self-pay | Admitting: Physician Assistant

## 2022-03-01 NOTE — Telephone Encounter (Signed)
PT is calling because she has some medication questions and also wants to discuss the tests [name] Esterwood had wanted her to do. Please advise.

## 2022-03-02 ENCOUNTER — Ambulatory Visit: Payer: BC Managed Care – PPO | Admitting: Urology

## 2022-03-02 NOTE — Telephone Encounter (Signed)
Patient is calling again states she didn't receive a call back regarding the medication questions she had. Please advise

## 2022-03-02 NOTE — Telephone Encounter (Signed)
Ruth Terry is scheduled for follow up 04/08/22.She is looking for new suggestions on dealing with her constipation and is hopeful you can make suggestions for her to try before the appointment. Her bowel movements are scant and about every other day. She is on Linzess 145 mcg, with daily Miralax and Metamucil. Patient was very recently on Doxycycline and a sulfa drug for a UTI. She will be 2 weeks out in a few days and plans to do your breath test then. The patient also wants to check her gastroparesis. She is concerned it is contributing to her general feeling of "unwellness."

## 2022-03-05 NOTE — Telephone Encounter (Signed)
Nothing to add. I wanted a tertiary care referral, but they (tertiary care center) said they do not have availability

## 2022-03-10 ENCOUNTER — Other Ambulatory Visit: Payer: Self-pay

## 2022-03-10 DIAGNOSIS — R109 Unspecified abdominal pain: Secondary | ICD-10-CM

## 2022-03-10 DIAGNOSIS — K219 Gastro-esophageal reflux disease without esophagitis: Secondary | ICD-10-CM

## 2022-03-10 NOTE — Telephone Encounter (Signed)
Patient aware. Order placed for [facility] at any location.

## 2022-03-16 DIAGNOSIS — T1511XA Foreign body in conjunctival sac, right eye, initial encounter: Secondary | ICD-10-CM | POA: Diagnosis not present

## 2022-03-16 DIAGNOSIS — H10013 Acute follicular conjunctivitis, bilateral: Secondary | ICD-10-CM | POA: Diagnosis not present

## 2022-03-21 ENCOUNTER — Ambulatory Visit
Admission: RE | Admit: 2022-03-21 | Discharge: 2022-03-21 | Disposition: A | Discharge status: Home / Self Care | Payer: BC Managed Care – PPO | Source: Ambulatory Visit | Attending: Nurse Practitioner | Admitting: Nurse Practitioner

## 2022-03-21 VITALS — BP 119/76 | HR 94 | Temp 98.1°F | Resp 18

## 2022-03-21 DIAGNOSIS — N898 Other specified noninflammatory disorders of vagina: Secondary | ICD-10-CM | POA: Insufficient documentation

## 2022-03-21 DIAGNOSIS — K644 Residual hemorrhoidal skin tags: Secondary | ICD-10-CM | POA: Insufficient documentation

## 2022-03-21 HISTORY — DX: Unspecified hemorrhoids: K64.9

## 2022-03-21 LAB — POCT URINALYSIS DIP (MANUAL ENTRY)
Bilirubin, UA: NEGATIVE
Blood, UA: NEGATIVE
Glucose, UA: NEGATIVE mg/dL
Ketones, POC UA: NEGATIVE mg/dL
Leukocytes, UA: NEGATIVE
Nitrite, UA: NEGATIVE
Protein Ur, POC: NEGATIVE mg/dL
Spec Grav, UA: 1.015 (ref 1.010–1.025)
Urobilinogen, UA: 0.2 E.U./dL
pH, UA: 6.5 (ref 5.0–8.0)

## 2022-03-21 MED ORDER — FLUCONAZOLE 150 MG PO TABS: 150.0000 mg | ORAL_TABLET | Freq: Once | ORAL | 0 refills | Status: AC

## 2022-03-21 MED ORDER — LIDOCAINE (ANORECTAL) 5 % EX CREA: 1.0000 | TOPICAL_CREAM | CUTANEOUS | 0 refills | Status: AC | PRN

## 2022-03-21 MED ORDER — HYDROCORTISONE ACETATE 25 MG RE SUPP: 25.0000 mg | Freq: Every evening | RECTAL | 0 refills | Status: DC | PRN

## 2022-03-21 NOTE — ED Provider Notes (Signed)
[facility]    CSN: [id number] Arrival date & time: 03/21/22  1327      History   Chief Complaint Chief Complaint  Patient presents with   Vaginal Itching    Entered by patient    HPI Ruth Terry is a 67 y.o. female.   The history is provided by the patient.   She presents for complaints of vaginal itching and hemorrhoids have been present for the past 2 to 3 days.  Patient states she had a dose antibiotics for a UTI over the last several weeks, but did not have any symptoms after she completed the antibiotic.  Patient also complains of rectal pain.  She states she does have a history of internal and external hemorrhoids.  States that she is concerned that they may be thrombosed.  She states that she normally takes hydrocortisone suppositories and RectiCare for her hemorrhoids.  She denies vaginal discharge, vaginal odor, rectal bleeding, urinary frequency, urgency, hesitancy, constipation, or abdominal pain. Past Medical History:  Diagnosis Date   Allergy    Anal fissure    Cataract    Chronic constipation    Coronary artery disease    Diverticulosis 04/20/2010   colonoscopy   Gastroparesis    GERD (gastroesophageal reflux disease)    Hemorrhoids    Hemorrhoids    Hyperlipidemia    IBS (irritable bowel syndrome)    MVP (mitral valve prolapse)    as a teenager per patient, no issues since   Osteoporosis    Polyp, stomach 04/20/2010   egd   Shingles    UTI (lower urinary tract infection)     Patient Active Problem List   Diagnosis Date Noted   CAD (coronary artery disease) 07/22/2021   COPD GOLD ? / group B 05/12/2021   Exertional chest pain 05/12/2021   Former smoker 05/12/2021   Generalized abdominal pain 01/02/2021   History of shingles 04/24/2019   Chronic midline low back pain without sciatica 02/27/2019   Chronic idiopathic constipation 04/26/2018   Aortic atherosclerosis ([place]) 03/01/2017   Rib fracture 12/07/2016   Gas pain 03/09/2016    Anal itching 03/09/2016   Elevated LDL cholesterol level 10/24/2015   Internal hemorrhoids 05/14/2014   GERD (gastroesophageal reflux disease) 09/17/2011   IBS (irritable bowel syndrome) 08/25/2010   Gastroparesis 06/30/2010   Non-ulcer dyspepsia 04/03/2010    Past Surgical History:  Procedure Laterality Date   CHOLECYSTECTOMY N/A 09/21/2021   Procedure: LAPAROSCOPIC CHOLECYSTECTOMY;  Surgeon: Dwan Bolt, MD;  Location: [place];  Service: General;  Laterality: N/A;   COLONOSCOPY     ESOPHAGOGASTRODUODENOSCOPY     INTRAVASCULAR PRESSURE WIRE/FFR STUDY N/A 07/22/2021   Procedure: INTRAVASCULAR PRESSURE WIRE/FFR STUDY;  Surgeon: Belva Crome, MD;  Location: [place] [facility];  Service: Cardiovascular;  Laterality: N/A;   LEFT HEART CATH AND CORONARY ANGIOGRAPHY N/A 07/22/2021   Procedure: LEFT HEART CATH AND CORONARY ANGIOGRAPHY;  Surgeon: Belva Crome, MD;  Location: [place] [facility];  Service: Cardiovascular;  Laterality: N/A;    OB History     Gravida  0   Para  0   Term  0   Preterm  0   AB  0   Living  0      SAB  0   IAB  0   Ectopic  0   Multiple  0   Live Births  0            Home Medications  Prior to Admission medications   Medication Sig Start Date End Date Taking? Authorizing Provider  fluconazole (DIFLUCAN) 150 MG tablet Take 1 tablet (150 mg total) by mouth once for 1 dose. 03/21/22 03/21/22 Yes Indiyah Paone-Warren, Alda Lea, NP  Lidocaine, Anorectal, (RECTICARE) 5 % CREA Apply 1 Application topically as needed. 03/21/22  Yes Tsutomu Barfoot-Warren, Alda Lea, NP  alfuzosin (UROXATRAL) 10 MG 24 hr tablet Take 1 tablet (10 mg total) by mouth daily with breakfast. 09/02/21   Summerlin, Berneice Heinrich, PA-C  aspirin EC 81 MG tablet Take 1 tablet (81 mg total) by mouth daily. Swallow whole. 07/17/21   Burnell Blanks, MD  Cholecalciferol (VITAMIN D3 PO) Take 1 tablet by mouth daily.    [provider]  docusate sodium (COLACE) 100 MG  capsule Take 1 capsule (100 mg total) by mouth 2 (two) times daily. 09/02/21   Summerlin, Berneice Heinrich, PA-C  doxycycline (VIBRAMYCIN) 100 MG capsule Take 1 capsule (100 mg total) by mouth every 12 (twelve) hours. 01/27/22   McKenzie, Candee Furbish, MD  DOXYCYCLINE PO Take by mouth.    [provider]  erythromycin ophthalmic ointment Place 1 Application into the right eye at bedtime. 09/14/21   [provider]  ezetimibe (ZETIA) 10 MG tablet Take 1 tablet (10 mg total) by mouth daily. 07/27/21   Belva Crome, MD  famotidine (PEPCID) 20 MG tablet One after supper 05/12/21   Tanda Rockers, MD  fluticasone [facility]) 50 MCG/ACT nasal spray Place 1 spray into both nostrils daily as needed for allergies or rhinitis.    [provider]  hydrocortisone (ANUSOL-HC) 25 MG suppository Place 1 suppository (25 mg total) rectally at bedtime as needed for hemorrhoids or anal itching. For 5 days 03/21/22   Sharelle Burditt-Warren, Alda Lea, NP  hyoscyamine (LEVSIN SL) 0.125 MG SL tablet Place 1 tablet (0.125 mg total) under the tongue every 6 (six) hours as needed. 12/22/21   Esterwood, [name] S, PA-C  levocetirizine (XYZAL) 5 MG tablet Take 1 tablet (5 mg total) by mouth every evening. For allergies 08/22/20   Ronnie Doss M, DO  Lido-PE-Mineral Oil-Petrolatum [facility] ADVANCED) 5-0.25-17-39 % CREA Use three times daily as needed 12/09/21   Esterwood, [name] S, PA-C  linaclotide (LINZESS) 145 MCG CAPS capsule Take 1 capsule (145 mcg total) by mouth daily before breakfast. 12/09/21   Esterwood, [name] S, PA-C  nitroGLYCERIN (NITROSTAT) 0.4 MG SL tablet Place 1 tablet (0.4 mg total) under the tongue every 5 (five) minutes as needed for chest pain. 07/17/21   Burnell Blanks, MD  ofloxacin (OCUFLOX) 0.3 % ophthalmic solution Place 1 drop into the right eye See admin instructions. Instill 1 drop into the right eye every hour 09/14/21   [provider]  ondansetron (ZOFRAN) 4 MG tablet Take 1  tablet (4 mg total) by mouth every 6 (six) hours as needed for nausea or vomiting. 12/09/21   Esterwood, [name] S, PA-C  ondansetron (ZOFRAN) 4 MG tablet Take 1 tablet (4 mg total) by mouth every 8 (eight) hours as needed for nausea or vomiting. 02/08/22   McKenzie, Candee Furbish, MD  pantoprazole (PROTONIX) 40 MG tablet Take once a day before breakfast 12/09/21   Esterwood, [name] S, PA-C  pravastatin (PRAVACHOL) 10 MG tablet Take 1 tablet (10 mg total) by mouth 3 (three) times a week. Take on Mondays, Wednesdays, and Fridays. 12/14/21   Belva Crome, MD  psyllium (METAMUCIL) 58.6 % packet Take 1 packet by mouth every evening.  [provider]  senna-docusate (SENOKOT S) 8.6-50 MG tablet Take 1-2 tablets by mouth at bedtime. 12/09/21   Esterwood, [name] S, PA-C  Simethicone (PHAZYME MAXIMUM STRENGTH) 250 MG CAPS Take 250 mg by mouth 3 (three) times daily. Patient taking differently: Take 250 mg by mouth 3 (three) times daily as needed (gas relief). 02/20/16   Terald Sleeper, PA-C  sucralfate (CARAFATE) 1 GM/10ML suspension Take 10 mLs (1 g total) by mouth 4 (four) times daily -  with meals and at bedtime. 12/21/21   Esterwood, [name] S, PA-C  sulfamethoxazole-trimethoprim (BACTRIM DS) 800-160 MG tablet Take 1 tablet by mouth every 12 (twelve) hours. 02/08/22   McKenzie, Candee Furbish, MD  valACYclovir (VALTREX) 1000 MG tablet TAKE 1 TABLET (1,000 MG TOTAL) BY MOUTH AT BEDTIME. 01/26/21   Hassell Done, Mary-Margaret, FNP  AMBULATORY NON FORMULARY MEDICATION Medication Name: Domperidone 10 mg Take 1 tablet at bedtime daily. Patient taking differently: Take 1 tablet by mouth at bedtime. Medication Name: Domperidone 10 mg Take 1 tablet at bedtime daily. 09/08/16 05/15/19  Esterwood, Glade Nurse, PA-C    Family History Family History  Problem Relation Age of Onset   Hypertension Mother    Diabetes Father    Heart attack Father        Age 36   Colon cancer Neg Hx    Esophageal cancer Neg Hx    Rectal cancer Neg Hx      Social History Social History   Tobacco Use   Smoking status: Some Days    Packs/day: 1.00    Years: 20.00    Additional pack years: 0.00    Total pack years: 20.00    Types: Cigarettes    Last attempt to quit: 05/2020    Years since quitting: 1.8   Smokeless tobacco: Never   Tobacco comments:    USE SMOKELESS CIGARETTES  Vaping Use   Vaping Use: Some days   Substances: Nicotine  Substance Use Topics   Alcohol use: Not Currently    Comment: 1 a week, wine   Drug use: No     Allergies   Chlordiazepoxide-clidinium, Ciprofloxacin, Flagyl [metronidazole], Sulfa drugs cross reactors, Nitrofurantoin monohyd macro, and Omnicef [cefdinir]   Review of Systems Review of Systems Per HPI  Physical Exam Triage Vital Signs ED Triage Vitals  Enc Vitals Group     BP 03/21/22 1421 119/76     Pulse Rate 03/21/22 1421 94     Resp 03/21/22 1421 18     Temp 03/21/22 1421 98.1 F (36.7 C)     Temp Source 03/21/22 1421 Oral     SpO2 03/21/22 1421 98 %     Weight --      Height --      Head Circumference --      Peak Flow --      Pain Score 03/21/22 1422 7     Pain Loc --      Pain Edu? --      Excl. in [place]? --    No data found.  Updated Vital Signs BP 119/76 (BP Location: Right Arm)   Pulse 94   Temp 98.1 F (36.7 C) (Oral)   Resp 18   SpO2 98%   Visual Acuity Right Eye Distance:   Left Eye Distance:   Bilateral Distance:    Right Eye Near:   Left Eye Near:    Bilateral Near:     Physical Exam Vitals and nursing note reviewed.  Constitutional:  General: She is not in acute distress.    Appearance: Normal appearance.  Eyes:     Extraocular Movements: Extraocular movements intact.     Pupils: Pupils are equal, round, and reactive to light.  Cardiovascular:     Rate and Rhythm: Normal rate and regular rhythm.     Pulses: Normal pulses.     Heart sounds: Normal heart sounds.  Pulmonary:     Effort: Pulmonary effort is normal.     Breath sounds:  Normal breath sounds.  Abdominal:     General: Bowel sounds are normal.     Palpations: Abdomen is soft.     Tenderness: There is no abdominal tenderness.  Genitourinary:    Vagina: Normal.     Rectum: External hemorrhoid present.  Musculoskeletal:     Cervical back: Normal range of motion.  Skin:    General: Skin is warm and dry.  Neurological:     Mental Status: She is alert and oriented to person, place, and time.  Psychiatric:        Mood and Affect: Mood normal.        Behavior: Behavior normal.      UC Treatments / Results  Labs (all labs ordered are listed, but only abnormal results are displayed) Labs Reviewed  POCT URINALYSIS DIP (MANUAL ENTRY) - Abnormal; Notable for the following components:      Result Value   Color, UA yellow (*)    All other components within normal limits  CERVICOVAGINAL ANCILLARY ONLY    EKG   Radiology No results found.  Procedures Procedures (including critical care time)  Medications Ordered in UC Medications - No data to display  Initial Impression / Assessment and Plan / UC Course  I have reviewed the triage vital signs and the nursing notes.  Pertinent labs & imaging results that were available during my care of the patient were reviewed by me and considered in my medical decision making (see chart for details).  The patient is well-appearing, she is in no acute distress, vital signs are stable.  Patient with vaginal itching and rectal pain.  Cytology swab is pending.  Will provide empirical treatment with fluconazole 150 mg tablet prescribed for possible yeast infection.  For her known history of hemorrhoids, hydrocortisone 25 mg suppositories were prescribed along with RectiCare 5% cream.  Supportive care recommendations were discussed with the patient.  Patient advised to follow-up with GI if symptoms fail to improve.  Patient verbalizes understanding, and is agreement with this plan of care.  All questions were answered.   Patient stable for discharge.   Final Clinical Impressions(s) / UC Diagnoses   Final diagnoses:  External hemorrhoids  Vaginal itching     Discharge Instructions      Patient declined AVS.     ED Prescriptions     Medication Sig Dispense Auth. Provider   hydrocortisone (ANUSOL-HC) 25 MG suppository Place 1 suppository (25 mg total) rectally at bedtime as needed for hemorrhoids or anal itching. For 5 days 5 suppository Khyler Urda-Warren, Alda Lea, NP   fluconazole (DIFLUCAN) 150 MG tablet Take 1 tablet (150 mg total) by mouth once for 1 dose. 1 tablet Amelda Hapke-Warren, Alda Lea, NP   Lidocaine, Anorectal, (RECTICARE) 5 % CREA Apply 1 Application topically as needed. 28 g Cesar Alf-Warren, Alda Lea, NP      PDMP not reviewed this encounter.   Tish Men, NP 03/21/22 1502

## 2022-03-21 NOTE — ED Triage Notes (Signed)
Vaginal itching and burning.  States she wants her urine checked for UTI due to hx of recent one.  States she is have issues with hemorrhoids and wants to make sure the are not protruding out.  States groin and buttocks are hurting.

## 2022-03-21 NOTE — Discharge Instructions (Addendum)
Patient declined AVS 

## 2022-03-22 LAB — CERVICOVAGINAL ANCILLARY ONLY
Bacterial Vaginitis (gardnerella): NEGATIVE
Candida Glabrata: POSITIVE — AB
Candida Vaginitis: POSITIVE — AB
Comment: NEGATIVE
Comment: NEGATIVE
Comment: NEGATIVE

## 2022-03-23 ENCOUNTER — Ambulatory Visit: Payer: BC Managed Care – PPO | Admitting: Urology

## 2022-04-08 ENCOUNTER — Ambulatory Visit: Payer: BC Managed Care – PPO | Admitting: Physician Assistant

## 2022-04-12 ENCOUNTER — Telehealth: Payer: Self-pay

## 2022-04-12 NOTE — Telephone Encounter (Signed)
Called the patient and advised. She states her spouse passed away this weekend. She asks to cancel the GES as well. She will call back.

## 2022-04-12 NOTE — Telephone Encounter (Signed)
-----   Message from Sammuel Cooper, PA-C sent at 04/12/2022  4:13 PM EDT ----- Regarding: RE: Case denied Please let pt know GE scan was denied by insurance - I don't feel strongly that she needs this either. ----- Message ----- FromFranz Dell Sent: 04/12/2022   8:25 AM EDT To: Sammuel Cooper, PA-C; Evalee Jefferson, LPN Subject: Case denied                                    Good morning Beth, I spoke with the pt's insurance and they have denied her gastric emptying that is scheduled for Thursday. She said you can call them at [phone] to provide further information. [facility] faxed what we had but she said the notes did not indicate the pt's need gastric emptying. The case number to give them is Case#[id number]. I will put this in the referral notes too.  Thank you.

## 2022-04-14 ENCOUNTER — Other Ambulatory Visit: Payer: BC Managed Care – PPO | Admitting: Urology

## 2022-04-14 DIAGNOSIS — N390 Urinary tract infection, site not specified: Secondary | ICD-10-CM

## 2022-04-15 ENCOUNTER — Encounter (HOSPITAL_COMMUNITY): Payer: BC Managed Care – PPO

## 2022-04-24 DIAGNOSIS — K6289 Other specified diseases of anus and rectum: Secondary | ICD-10-CM | POA: Diagnosis not present

## 2022-04-24 DIAGNOSIS — R399 Unspecified symptoms and signs involving the genitourinary system: Secondary | ICD-10-CM | POA: Diagnosis not present

## 2022-04-24 DIAGNOSIS — R300 Dysuria: Secondary | ICD-10-CM | POA: Diagnosis not present

## 2022-04-24 DIAGNOSIS — Z8719 Personal history of other diseases of the digestive system: Secondary | ICD-10-CM | POA: Diagnosis not present

## 2022-04-28 ENCOUNTER — Ambulatory Visit: Payer: BC Managed Care – PPO | Admitting: Physician Assistant

## 2022-04-30 ENCOUNTER — Other Ambulatory Visit: Payer: Self-pay | Admitting: Physician Assistant

## 2022-05-17 ENCOUNTER — Other Ambulatory Visit: Payer: Self-pay | Admitting: Physician Assistant

## 2022-05-20 DIAGNOSIS — R350 Frequency of micturition: Secondary | ICD-10-CM | POA: Diagnosis not present

## 2022-05-20 DIAGNOSIS — K648 Other hemorrhoids: Secondary | ICD-10-CM | POA: Diagnosis not present

## 2022-05-20 DIAGNOSIS — R309 Painful micturition, unspecified: Secondary | ICD-10-CM | POA: Diagnosis not present

## 2022-06-01 ENCOUNTER — Telehealth: Payer: Self-pay | Admitting: Physician Assistant

## 2022-06-01 DIAGNOSIS — R109 Unspecified abdominal pain: Secondary | ICD-10-CM

## 2022-06-01 NOTE — Telephone Encounter (Signed)
Patient called requesting to speak with a nurse regarding her next steps.

## 2022-06-01 NOTE — Telephone Encounter (Signed)
Spoke with patient who states that she continues to have abdominal pain. Periumbilical abdominal pain is now "much worse" and is constant, sharp in nature. She describes alternating constipation and diarrhea and does confirm that she is taking Lizness as directed. States she had one episode of brb but no further episodes.Patient is not interested in completing SIBO testing at the moment. She would like to see if [name] will "order labs, xrays or something" to see what this pain is. Offered appointment with Dr Marina Goodell for follow up but patient requests to see [name]. Appointment scheduled 08/27/22. Patient advised that if pain increases or develops additional symptoms, call back or seek ER care.

## 2022-06-03 NOTE — Telephone Encounter (Signed)
Esterwood, [name] S, PA-C  You23 hours ago (10:37 AM)   Lets get an abdominal US, and cbc with diff, CMET , lipase

## 2022-06-03 NOTE — Telephone Encounter (Signed)
I have spoken to patient to advise of [name]'s recommendations. Patient requests to have labs done through [facility] at [facility]. I have entered labs to be completed with [facility]. In addition, patient has been scheduled for an abdominal ultrasound at [facility] on Wednesday, June 5th at 9:00 am. Patient requested [facility] but the next availability for that would be 06/21/22. Patient has decided to keep the appointment at [facility]. Patient instructed to be NPO 6 hours prior to ultrasound and to arrive at 845 am. She verbalizes understanding.

## 2022-06-03 NOTE — Telephone Encounter (Signed)
Patient calling to follow up. Please advise.

## 2022-06-04 ENCOUNTER — Ambulatory Visit (INDEPENDENT_AMBULATORY_CARE_PROVIDER_SITE_OTHER): Payer: BC Managed Care – PPO

## 2022-06-04 ENCOUNTER — Other Ambulatory Visit: Payer: Self-pay

## 2022-06-04 ENCOUNTER — Emergency Department (HOSPITAL_COMMUNITY)
Admission: EM | Admit: 2022-06-04 | Discharge: 2022-06-04 | Disposition: A | Discharge status: Home / Self Care | Payer: BC Managed Care – PPO | Attending: Emergency Medicine | Admitting: Emergency Medicine

## 2022-06-04 ENCOUNTER — Encounter (HOSPITAL_COMMUNITY): Payer: Self-pay | Admitting: Emergency Medicine

## 2022-06-04 ENCOUNTER — Emergency Department (HOSPITAL_COMMUNITY): Payer: BC Managed Care – PPO

## 2022-06-04 ENCOUNTER — Ambulatory Visit
Admission: RE | Admit: 2022-06-04 | Discharge: 2022-06-04 | Disposition: A | Discharge status: Home / Self Care | Payer: BC Managed Care – PPO | Source: Ambulatory Visit | Attending: Urgent Care | Admitting: Urgent Care

## 2022-06-04 VITALS — BP 143/70 | HR 91 | Temp 97.4°F | Resp 18

## 2022-06-04 DIAGNOSIS — R100 Acute abdomen: Secondary | ICD-10-CM | POA: Diagnosis not present

## 2022-06-04 DIAGNOSIS — R1033 Periumbilical pain: Secondary | ICD-10-CM | POA: Diagnosis not present

## 2022-06-04 DIAGNOSIS — R739 Hyperglycemia, unspecified: Secondary | ICD-10-CM | POA: Diagnosis not present

## 2022-06-04 DIAGNOSIS — K2900 Acute gastritis without bleeding: Secondary | ICD-10-CM

## 2022-06-04 DIAGNOSIS — R109 Unspecified abdominal pain: Secondary | ICD-10-CM | POA: Diagnosis not present

## 2022-06-04 DIAGNOSIS — R1084 Generalized abdominal pain: Secondary | ICD-10-CM | POA: Diagnosis not present

## 2022-06-04 DIAGNOSIS — Z7982 Long term (current) use of aspirin: Secondary | ICD-10-CM | POA: Diagnosis not present

## 2022-06-04 DIAGNOSIS — D649 Anemia, unspecified: Secondary | ICD-10-CM | POA: Insufficient documentation

## 2022-06-04 DIAGNOSIS — I700 Atherosclerosis of aorta: Secondary | ICD-10-CM | POA: Diagnosis not present

## 2022-06-04 LAB — CBC
HCT: 36.6 % (ref 36.0–46.0)
Hemoglobin: 11.7 g/dL — ABNORMAL LOW (ref 12.0–15.0)
MCH: 27.4 pg (ref 26.0–34.0)
MCHC: 32 g/dL (ref 30.0–36.0)
MCV: 85.7 fL (ref 80.0–100.0)
Platelets: 201 10*3/uL (ref 150–400)
RBC: 4.27 MIL/uL (ref 3.87–5.11)
RDW: 13.9 % (ref 11.5–15.5)
WBC: 4.1 10*3/uL (ref 4.0–10.5)
nRBC: 0 % (ref 0.0–0.2)

## 2022-06-04 LAB — COMPREHENSIVE METABOLIC PANEL
ALT: 11 U/L (ref 0–44)
AST: 14 U/L — ABNORMAL LOW (ref 15–41)
Albumin: 3.7 g/dL (ref 3.5–5.0)
Alkaline Phosphatase: 69 U/L (ref 38–126)
Anion gap: 9 (ref 5–15)
BUN: 9 mg/dL (ref 8–23)
CO2: 25 mmol/L (ref 22–32)
Calcium: 8.9 mg/dL (ref 8.9–10.3)
Chloride: 103 mmol/L (ref 98–111)
Creatinine, Ser: 0.71 mg/dL (ref 0.44–1.00)
GFR, Estimated: >60 mL/min (ref >60)
Glucose, Bld: 121 mg/dL — ABNORMAL HIGH (ref 70–99)
Potassium: 3.5 mmol/L (ref 3.5–5.1)
Sodium: 137 mmol/L (ref 135–145)
Total Bilirubin: 0.4 mg/dL (ref 0.3–1.2)
Total Protein: 6.5 g/dL (ref 6.5–8.1)

## 2022-06-04 LAB — URINALYSIS, ROUTINE W REFLEX MICROSCOPIC
Bilirubin Urine: NEGATIVE
Glucose, UA: NEGATIVE mg/dL
Hgb urine dipstick: NEGATIVE
Ketones, ur: NEGATIVE mg/dL
Leukocytes,Ua: NEGATIVE
Nitrite: NEGATIVE
Protein, ur: NEGATIVE mg/dL
Specific Gravity, Urine: 1.003 — ABNORMAL LOW (ref 1.005–1.030)
pH: 7 (ref 5.0–8.0)

## 2022-06-04 LAB — POCT URINALYSIS DIP (MANUAL ENTRY)
Bilirubin, UA: NEGATIVE
Blood, UA: NEGATIVE
Glucose, UA: NEGATIVE mg/dL
Ketones, POC UA: NEGATIVE mg/dL
Leukocytes, UA: NEGATIVE
Nitrite, UA: NEGATIVE
Protein Ur, POC: NEGATIVE mg/dL
Spec Grav, UA: 1.01 (ref 1.010–1.025)
Urobilinogen, UA: 0.2 E.U./dL
pH, UA: 6 (ref 5.0–8.0)

## 2022-06-04 LAB — POC OCCULT BLOOD, ED: Fecal Occult Bld: NEGATIVE

## 2022-06-04 LAB — LIPASE, BLOOD: Lipase: 40 U/L (ref 11–51)

## 2022-06-04 MED ORDER — SODIUM CHLORIDE 0.9 % IV BOLUS
1000.0000 mL | Freq: Once | INTRAVENOUS | Status: AC
  Administered 2022-06-04: 1000 mL via INTRAVENOUS

## 2022-06-04 MED ORDER — DICYCLOMINE HCL 10 MG PO CAPS
10.0000 mg | ORAL_CAPSULE | Freq: Once | ORAL | Status: AC
  Administered 2022-06-04: 10 mg via ORAL
  Filled 2022-06-04: qty 1

## 2022-06-04 MED ORDER — ALUM & MAG HYDROXIDE-SIMETH 200-200-20 MG/5ML PO SUSP
30.0000 mL | Freq: Once | ORAL | Status: AC
  Administered 2022-06-04: 30 mL via ORAL
  Filled 2022-06-04: qty 30

## 2022-06-04 MED ORDER — IOHEXOL 300 MG/ML  SOLN
100.0000 mL | Freq: Once | INTRAMUSCULAR | Status: AC | PRN
  Administered 2022-06-04: 100 mL via INTRAVENOUS

## 2022-06-04 MED ORDER — SUCRALFATE 1 GM/10ML PO SUSP: 1.0000 g | Freq: Three times a day (TID) | ORAL | 0 refills | Status: DC

## 2022-06-04 NOTE — ED Notes (Signed)
Transport arrived.

## 2022-06-04 NOTE — ED Notes (Signed)
Pt states that she has IBS-C and stomach has been hurting, rate of 8 today. Went to Urgent Care and was told to come to ED after discharge from there and after completing an x-ray. Pt has been under a lot of stress this year with sickness and death in her family. She takes medication daily to help with constipation. She states that she just feels "really bloated and weak recently".

## 2022-06-04 NOTE — ED Provider Notes (Signed)
[facility] - URGENT [facility]  Note:  This document was prepared using Conservation officer, historic buildings and may include unintentional dictation errors.  MRN: [id number] DOB: [birth year]  Subjective:   Ruth Terry is a 67 y.o. female presenting for 4-day history of acute onset moderate to severe persistent abdominal pain throughout worse laterally on either side radiating toward her flank sides.  Denies fever, nausea, vomiting, diarrhea, constipation, dysuria, hematuria, urinary frequency.  Last bowel movement was this morning.  No history of kidney stones.  Chart review shows that this is a chronic problem for the patient.  Has well-established diagnoses including nonulcerative dyspepsia, gastroparesis, IBS, acid reflux and chronic generalized abdominal pain.  She had a CT scan 12/28/2021 and was negative.  She has also had imaging done of the lumbar region, bilateral hips both of which were normal.  Patient states that she came here to get an abdominal x-ray, called ahead of time to confirm that we had one.  She has worked with her PCP and a repeat CT scan has been ordered, ultrasound has been ordered, scheduled to be done in about a week.   Current Facility-Administered Medications:    methylPREDNISolone acetate (DEPO-MEDROL) injection 40 mg, 40 mg, Intramuscular, Once, Gottschalk, Ashly M, DO  Current Outpatient Medications:    alfuzosin (UROXATRAL) 10 MG 24 hr tablet, Take 1 tablet (10 mg total) by mouth daily with breakfast., Disp: 30 tablet, Rfl: 11   aspirin EC 81 MG tablet, Take 1 tablet (81 mg total) by mouth daily. Swallow whole., Disp: 90 tablet, Rfl: 3   Cholecalciferol (VITAMIN D3 PO), Take 1 tablet by mouth daily., Disp: , Rfl:    docusate sodium (COLACE) 100 MG capsule, Take 1 capsule (100 mg total) by mouth 2 (two) times daily., Disp: 20 capsule, Rfl: 0   doxycycline (VIBRAMYCIN) 100 MG capsule, Take 1 capsule (100 mg total) by mouth every 12 (twelve) hours., Disp: 14  capsule, Rfl: 0   DOXYCYCLINE PO, Take by mouth., Disp: , Rfl:    erythromycin ophthalmic ointment, Place 1 Application into the right eye at bedtime., Disp: , Rfl:    ezetimibe (ZETIA) 10 MG tablet, Take 1 tablet (10 mg total) by mouth daily., Disp: 90 tablet, Rfl: 3   famotidine (PEPCID) 20 MG tablet, One after supper, Disp: , Rfl:    fluticasone (FLONASE) 50 MCG/ACT nasal spray, Place 1 spray into both nostrils daily as needed for allergies or rhinitis., Disp: , Rfl:    hydrocortisone (ANUSOL-HC) 25 MG suppository, Place 1 suppository (25 mg total) rectally at bedtime as needed for hemorrhoids or anal itching. For 5 days, Disp: 5 suppository, Rfl: 0   hyoscyamine (LEVSIN SL) 0.125 MG SL tablet, Place 1 tablet (0.125 mg total) under the tongue every 6 (six) hours as needed., Disp: 40 tablet, Rfl: 0   levocetirizine (XYZAL) 5 MG tablet, Take 1 tablet (5 mg total) by mouth every evening. For allergies, Disp: 90 tablet, Rfl: 3   Lido-PE-Mineral Oil-Petrolatum (RECTICARE ADVANCED) 5-0.25-17-39 % CREA, Use three times daily as needed, Disp: , Rfl:    Lidocaine, Anorectal, (RECTICARE) 5 % CREA, Apply 1 Application topically as needed., Disp: 28 g, Rfl: 0   LINZESS 145 MCG CAPS capsule, TAKE 1 CAPSULE BY MOUTH EVERY MORNING BEFORE BREAKFAST, Disp: 90 capsule, Rfl: 1   nitroGLYCERIN (NITROSTAT) 0.4 MG SL tablet, Place 1 tablet (0.4 mg total) under the tongue every 5 (five) minutes as needed for chest pain., Disp: 25 tablet, Rfl: 3  ofloxacin (OCUFLOX) 0.3 % ophthalmic solution, Place 1 drop into the right eye See admin instructions. Instill 1 drop into the right eye every hour, Disp: , Rfl:    ondansetron (ZOFRAN) 4 MG tablet, Take 1 tablet (4 mg total) by mouth every 6 (six) hours as needed for nausea or vomiting., Disp: 60 tablet, Rfl: 5   ondansetron (ZOFRAN) 4 MG tablet, Take 1 tablet (4 mg total) by mouth every 8 (eight) hours as needed for nausea or vomiting., Disp: 20 tablet, Rfl: 0   pantoprazole  (PROTONIX) 40 MG tablet, TAKE 1 TABLET BY MOUTH EVERY MORNING BEFORE BREAKFAST, Disp: 90 tablet, Rfl: 2   pravastatin (PRAVACHOL) 10 MG tablet, Take 1 tablet (10 mg total) by mouth 3 (three) times a week. Take on Mondays, Wednesdays, and Fridays., Disp: 35 tablet, Rfl: 3   psyllium (METAMUCIL) 58.6 % packet, Take 1 packet by mouth every evening., Disp: , Rfl:    senna-docusate (SENOKOT S) 8.6-50 MG tablet, Take 1-2 tablets by mouth at bedtime., Disp: , Rfl:    Simethicone (PHAZYME MAXIMUM STRENGTH) 250 MG CAPS, Take 250 mg by mouth 3 (three) times daily. (Patient taking differently: Take 250 mg by mouth 3 (three) times daily as needed (gas relief).), Disp: 14 capsule, Rfl:    sucralfate (CARAFATE) 1 GM/10ML suspension, Take 10 mLs (1 g total) by mouth 4 (four) times daily -  with meals and at bedtime., Disp: 1200 mL, Rfl: 0   sulfamethoxazole-trimethoprim (BACTRIM DS) 800-160 MG tablet, Take 1 tablet by mouth every 12 (twelve) hours., Disp: 14 tablet, Rfl: 0   valACYclovir (VALTREX) 1000 MG tablet, TAKE 1 TABLET (1,000 MG TOTAL) BY MOUTH AT BEDTIME., Disp: 90 tablet, Rfl: 0   Allergies  Allergen Reactions   Chlordiazepoxide-Clidinium Itching    (Librax)   Ciprofloxacin Itching    Irritates stomach   Flagyl [Metronidazole]     Severe yeast infection   Sulfa Drugs Cross Reactors Nausea Only   Nitrofurantoin Monohyd Macro Rash   Omnicef [Cefdinir] Nausea And Vomiting    Past Medical History:  Diagnosis Date   Allergy    Anal fissure    Cataract    Chronic constipation    Coronary artery disease    Diverticulosis 04/20/2010   colonoscopy   Gastroparesis    GERD (gastroesophageal reflux disease)    Hemorrhoids    Hemorrhoids    Hyperlipidemia    IBS (irritable bowel syndrome)    MVP (mitral valve prolapse)    as a teenager per patient, no issues since   Osteoporosis    Polyp, stomach 04/20/2010   egd   Shingles    UTI (lower urinary tract infection)      Past Surgical  History:  Procedure Laterality Date   CHOLECYSTECTOMY N/A 09/21/2021   Procedure: LAPAROSCOPIC CHOLECYSTECTOMY;  Surgeon: Fritzi Mandes, MD;  Location: [facility] OR;  Service: General;  Laterality: N/A;   COLONOSCOPY     CORONARY PRESSURE/FFR STUDY N/A 07/22/2021   Procedure: INTRAVASCULAR PRESSURE WIRE/FFR STUDY;  Surgeon: Lyn Records, MD;  Location: [facility] INVASIVE CV LAB;  Service: Cardiovascular;  Laterality: N/A;   ESOPHAGOGASTRODUODENOSCOPY     LEFT HEART CATH AND CORONARY ANGIOGRAPHY N/A 07/22/2021   Procedure: LEFT HEART CATH AND CORONARY ANGIOGRAPHY;  Surgeon: Lyn Records, MD;  Location: [facility] INVASIVE CV [facility];  Service: Cardiovascular;  Laterality: N/A;    Family History  Problem Relation Age of Onset   Hypertension Mother    Diabetes Father    Heart  attack Father        Age 69   Colon cancer Neg Hx    Esophageal cancer Neg Hx    Rectal cancer Neg Hx     Social History   Tobacco Use   Smoking status: Some Days    Packs/day: 1.00    Years: 20.00    Additional pack years: 0.00    Total pack years: 20.00    Types: Cigarettes    Last attempt to quit: 05/2020    Years since quitting: 2.0   Smokeless tobacco: Never   Tobacco comments:    USE SMOKELESS CIGARETTES  Vaping Use   Vaping Use: Some days   Substances: Nicotine  Substance Use Topics   Alcohol use: Not Currently    Comment: 1 a week, wine   Drug use: No    ROS   Objective:   Vitals: BP (!) 143/70 (BP Location: Right Arm)   Pulse 91   Temp (!) 97.4 F (36.3 C) (Oral)   Resp 18   SpO2 99%   Physical Exam Constitutional:      General: She is not in acute distress.    Appearance: Normal appearance. She is well-developed. She is not ill-appearing, toxic-appearing or diaphoretic.  HENT:     Head: Normocephalic and atraumatic.     Nose: Nose normal.     Mouth/Throat:     Mouth: Mucous membranes are moist.  Eyes:     General: No scleral icterus.       Right eye: No discharge.        Left eye: No  discharge.     Extraocular Movements: Extraocular movements intact.     Conjunctiva/sclera: Conjunctivae normal.  Cardiovascular:     Rate and Rhythm: Normal rate.  Pulmonary:     Effort: Pulmonary effort is normal.  Abdominal:     General: Bowel sounds are normal. There is no distension.     Palpations: Abdomen is soft. There is no mass.     Tenderness: There is generalized abdominal tenderness. There is guarding. There is no right CVA tenderness, left CVA tenderness or rebound.  Skin:    General: Skin is warm and dry.  Neurological:     General: No focal deficit present.     Mental Status: She is alert and oriented to person, place, and time.  Psychiatric:        Mood and Affect: Mood normal.        Behavior: Behavior normal.        Thought Content: Thought content normal.        Judgment: Judgment normal.    Results for orders placed or performed during the hospital encounter of 06/04/22 (from the past 24 hour(s))  POCT urinalysis dipstick     Status: Abnormal   Collection Time: 06/04/22  2:17 PM  Result Value Ref Range   Color, UA light yellow (A) yellow   Clarity, UA clear clear   Glucose, UA negative negative mg/dL   Bilirubin, UA negative negative   Ketones, POC UA negative negative mg/dL   Spec Grav, UA 4.132 4.401 - 1.025   Blood, UA negative negative   pH, UA 6.0 5.0 - 8.0   Protein Ur, POC negative negative mg/dL   Urobilinogen, UA 0.2 0.2 or 1.0 E.U./dL   Nitrite, UA Negative Negative   Leukocytes, UA Negative Negative   DG Abd 1 View  Result Date: 06/04/2022 CLINICAL DATA:  Abdominal pain EXAM: ABDOMEN - 1 VIEW COMPARISON:  01/02/2021 FINDINGS: Mottled gas pattern is nonspecific. No abnormal masses or calcifications are seen. Surgical clips are seen in right upper quadrant. Small sclerotic density in proximal right femur has not changed, possibly bone island. IMPRESSION: Nonspecific bowel gas pattern. Electronically Signed   By: Ernie Avena M.D.   On:  06/04/2022 15:23    Assessment and Plan :   PDMP not reviewed this encounter.  1. Acute abdomen    Discussed utility of an abdominal x-ray and the fact that it would be low yield. Patient insisted on an x-ray. I advised that as she is describing the worst abdominal pain she has felt, had abdominal guarding would need to be seen through the emergency room to rule out an acute abdomen. Advised patient to present to the emergency room now.    Wallis Bamberg, [place] 06/04/22 1530

## 2022-06-04 NOTE — ED Triage Notes (Signed)
Pt reports she is having abdominal pain that radiates to her sides x 4 days. Pt states her mind is "foggy". Pt states she is having trouble making bowel movements.

## 2022-06-04 NOTE — Discharge Instructions (Signed)
You are in need of a higher level of testing and care than we can provide in the urgent care setting. You have severe abdominal pain more than normal. Your x-ray as expected is normal for bowel obstruction and constipation. However, as I discussed with you does not rule out a severe abdominal emergency. Please head to the emergency room now.

## 2022-06-04 NOTE — ED Provider Notes (Signed)
[place] EMERGENCY DEPARTMENT AT [facility] Provider Note   CSN: [id number] Arrival date & time: 06/04/22  1601     History  Chief Complaint  Patient presents with   Abdominal Pain    Ruth Terry is a 67 y.o. female.  With a history of nonulcer dyspepsia, gastroparesis, IBS, GERD, diverticulosis who presents to the ED for evaluation of abdominal pain.  Symptoms began approximately 5 days ago.  Got significantly worse yesterday.  States it is a generalized abdominal pain and is unable to localize it.  It is constant.  It radiates to bilateral flanks.  Described as an intense cramp.  No aggravating or alleviating factors.  She has had abdominal pain symptoms for quite some time.  Follows with GI and has an abdominal ultrasound scheduled in the future for similar symptoms.  Initially presented to urgent care but it was thought that she needed a higher level of care and was sent to the ED.  Urgent care had a normal urinalysis and reassuring abdominal x-ray.  She denies any fevers, nausea, vomiting, diarrhea, constipation, lower abdominal pain, urinary symptoms.  States it does not feel like her gastroparesis or IBS pain she has had in the past.   Abdominal Pain      Home Medications Prior to Admission medications   Medication Sig Start Date End Date Taking? Authorizing Provider  alfuzosin (UROXATRAL) 10 MG 24 hr tablet Take 1 tablet (10 mg total) by mouth daily with breakfast. 09/02/21   Summerlin, Regan Rakers, PA-C  aspirin EC 81 MG tablet Take 1 tablet (81 mg total) by mouth daily. Swallow whole. 07/17/21   Kathleene Hazel, MD  Cholecalciferol (VITAMIN D3 PO) Take 1 tablet by mouth daily.    [provider]  docusate sodium (COLACE) 100 MG capsule Take 1 capsule (100 mg total) by mouth 2 (two) times daily. 09/02/21   Summerlin, Regan Rakers, PA-C  doxycycline (VIBRAMYCIN) 100 MG capsule Take 1 capsule (100 mg total) by mouth every 12 (twelve) hours.  01/27/22   McKenzie, Mardene Celeste, MD  DOXYCYCLINE PO Take by mouth.    [provider]  erythromycin ophthalmic ointment Place 1 Application into the right eye at bedtime. 09/14/21   [provider]  ezetimibe (ZETIA) 10 MG tablet Take 1 tablet (10 mg total) by mouth daily. 07/27/21   Lyn Records, MD  famotidine (PEPCID) 20 MG tablet One after supper 05/12/21   Nyoka Cowden, MD  fluticasone [facility]) 50 MCG/ACT nasal spray Place 1 spray into both nostrils daily as needed for allergies or rhinitis.    [provider]  hydrocortisone (ANUSOL-HC) 25 MG suppository Place 1 suppository (25 mg total) rectally at bedtime as needed for hemorrhoids or anal itching. For 5 days 03/21/22   Leath-Warren, Sadie Haber, NP  hyoscyamine (LEVSIN SL) 0.125 MG SL tablet Place 1 tablet (0.125 mg total) under the tongue every 6 (six) hours as needed. 12/22/21   Esterwood, [name] S, PA-C  levocetirizine (XYZAL) 5 MG tablet Take 1 tablet (5 mg total) by mouth every evening. For allergies 08/22/20   Delynn Flavin M, DO  Lido-PE-Mineral Oil-Petrolatum [facility] ADVANCED) 5-0.25-17-39 % CREA Use three times daily as needed 12/09/21   Esterwood, [name] S, PA-C  Lidocaine, Anorectal, (RECTICARE) 5 % CREA Apply 1 Application topically as needed. 03/21/22   Leath-Warren, Sadie Haber, NP  LINZESS 145 MCG CAPS capsule TAKE 1 CAPSULE BY MOUTH EVERY MORNING BEFORE BREAKFAST 05/17/22   Esterwood, [name] S,  PA-C  nitroGLYCERIN (NITROSTAT) 0.4 MG SL tablet Place 1 tablet (0.4 mg total) under the tongue every 5 (five) minutes as needed for chest pain. 07/17/21   Kathleene Hazel, MD  ofloxacin (OCUFLOX) 0.3 % ophthalmic solution Place 1 drop into the right eye See admin instructions. Instill 1 drop into the right eye every hour 09/14/21   [provider]  ondansetron (ZOFRAN) 4 MG tablet Take 1 tablet (4 mg total) by mouth every 6 (six) hours as needed for nausea or vomiting. 12/09/21   Esterwood, [name] S, PA-C   ondansetron (ZOFRAN) 4 MG tablet Take 1 tablet (4 mg total) by mouth every 8 (eight) hours as needed for nausea or vomiting. 02/08/22   McKenzie, Mardene Celeste, MD  pantoprazole (PROTONIX) 40 MG tablet TAKE 1 TABLET BY MOUTH EVERY MORNING BEFORE BREAKFAST 04/30/22   Esterwood, [name] S, PA-C  pravastatin (PRAVACHOL) 10 MG tablet Take 1 tablet (10 mg total) by mouth 3 (three) times a week. Take on Mondays, Wednesdays, and Fridays. 12/14/21   Lyn Records, MD  psyllium (METAMUCIL) 58.6 % packet Take 1 packet by mouth every evening.    [provider]  senna-docusate (SENOKOT S) 8.6-50 MG tablet Take 1-2 tablets by mouth at bedtime. 12/09/21   Esterwood, [name] S, PA-C  Simethicone (PHAZYME MAXIMUM STRENGTH) 250 MG CAPS Take 250 mg by mouth 3 (three) times daily. Patient taking differently: Take 250 mg by mouth 3 (three) times daily as needed (gas relief). 02/20/16   Remus Loffler, PA-C  sucralfate (CARAFATE) 1 GM/10ML suspension Take 10 mLs (1 g total) by mouth 4 (four) times daily -  with meals and at bedtime. 12/21/21   Esterwood, [name] S, PA-C  sulfamethoxazole-trimethoprim (BACTRIM DS) 800-160 MG tablet Take 1 tablet by mouth every 12 (twelve) hours. 02/08/22   McKenzie, Mardene Celeste, MD  valACYclovir (VALTREX) 1000 MG tablet TAKE 1 TABLET (1,000 MG TOTAL) BY MOUTH AT BEDTIME. 01/26/21   Daphine Deutscher, Mary-Margaret, FNP  AMBULATORY NON FORMULARY MEDICATION Medication Name: Domperidone 10 mg Take 1 tablet at bedtime daily. Patient taking differently: Take 1 tablet by mouth at bedtime. Medication Name: Domperidone 10 mg Take 1 tablet at bedtime daily. 09/08/16 05/15/19  Esterwood, [name] S, PA-C      Allergies    Chlordiazepoxide-clidinium, Ciprofloxacin, Flagyl [metronidazole], Sulfa drugs cross reactors, Nitrofurantoin monohyd macro, and Omnicef [cefdinir]    Review of Systems   Review of Systems  Gastrointestinal:  Positive for abdominal pain.  All other systems reviewed and are negative.   Physical  Exam Updated Vital Signs BP 132/69   Pulse 75   Temp 98.4 F (36.9 C) (Oral)   Resp 14   Ht 5' (1.524 m)   Wt 44 kg   SpO2 94%   BMI 18.94 kg/m  Physical Exam Vitals and nursing note reviewed.  Constitutional:      General: She is not in acute distress.    Appearance: She is well-developed.  HENT:     Head: Normocephalic and atraumatic.  Eyes:     Conjunctiva/sclera: Conjunctivae normal.  Cardiovascular:     Rate and Rhythm: Normal rate and regular rhythm.     Heart sounds: No murmur heard. Pulmonary:     Effort: Pulmonary effort is normal. No respiratory distress.     Breath sounds: Normal breath sounds.  Abdominal:     General: Abdomen is flat. A surgical scar is present.     Palpations: Abdomen is soft.     Tenderness: There  is abdominal tenderness in the periumbilical area. There is guarding.     Comments: No rashes  Musculoskeletal:        General: No swelling.     Cervical back: Neck supple.  Skin:    General: Skin is warm and dry.     Capillary Refill: Capillary refill takes less than 2 seconds.  Neurological:     Mental Status: She is alert.  Psychiatric:        Mood and Affect: Mood normal.     ED Results / Procedures / Treatments   Labs (all labs ordered are listed, but only abnormal results are displayed) Labs Reviewed  COMPREHENSIVE METABOLIC PANEL - Abnormal; Notable for the following components:      Result Value   Glucose, Bld 121 (*)    AST 14 (*)    All other components within normal limits  CBC - Abnormal; Notable for the following components:   Hemoglobin 11.7 (*)    All other components within normal limits  URINALYSIS, ROUTINE W REFLEX MICROSCOPIC - Abnormal; Notable for the following components:   Color, Urine COLORLESS (*)    Specific Gravity, Urine 1.003 (*)    All other components within normal limits  LIPASE, BLOOD    EKG EKG Interpretation  Date/Time:  Friday Jun 04 2022 16:45:05 EDT Ventricular Rate:  85 PR  Interval:  144 QRS Duration: 82 QT Interval:  357 QTC Calculation: 425 R Axis:   60 Text Interpretation: Sinus rhythm Multiple ventricular premature complexes Abnormal R-wave progression, early transition Confirmed by Vanetta Mulders [phone]) on 06/04/2022 7:26:02 PM  Radiology DG Abd 1 View  Result Date: 06/04/2022 CLINICAL DATA:  Abdominal pain EXAM: ABDOMEN - 1 VIEW COMPARISON:  01/02/2021 FINDINGS: Mottled gas pattern is nonspecific. No abnormal masses or calcifications are seen. Surgical clips are seen in right upper quadrant. Small sclerotic density in proximal right femur has not changed, possibly bone island. IMPRESSION: Nonspecific bowel gas pattern. Electronically Signed   By: Ernie Avena M.D.   On: 06/04/2022 15:23    Procedures Procedures    Medications Ordered in ED Medications  sodium chloride 0.9 % bolus 1,000 mL (0 mLs Intravenous Stopped 06/04/22 1920)  dicyclomine (BENTYL) capsule 10 mg (10 mg Oral Given 06/04/22 1819)    ED Course/ Medical Decision Making/ A&P Clinical Course as of 06/04/22 2010  Fri Jun 04, 2022  1945 Dr. Renaye Rakers is accepting physician at [facility] [AS]    Clinical Course User Index [AS] Michelle Piper, PA-C                             Medical Decision Making Amount and/or Complexity of Data Reviewed Labs: ordered. Radiology: ordered.  Risk Prescription drug management.  This patient presents to the ED for concern of abdominal pain, this involves an extensive number of treatment options, and is a complaint that carries with it a high risk of complications and morbidity.  The differential diagnosis includes The differential diagnosis for generalized abdominal pain includes, but is not limited to AAA, gastroenteritis, appendicitis, Bowel obstruction, Bowel perforation. Gastroparesis, DKA, Hernia, Inflammatory bowel disease, mesenteric ischemia, pancreatitis, peritonitis SBP, volvulus.   Co morbidities that complicate the patient  evaluation   nonulcer dyspepsia, gastroparesis, IBS, GERD, diverticulosis  My initial workup includes labs, ultrasound  Additional history obtained from: Nursing notes from this visit. Previous records within EMR system urgent care visit prior to arrival  I ordered, reviewed  and interpreted labs which include: CBC, CMP, lipase.  Hyperglycemia 121.  Anemia with hemoglobin of 11.7.  Labs otherwise normal.  I ordered imaging studies including CT abdomen/pelvis  Afebrile, hemodynamically stable.  67 year old female presenting to the ED for evaluation of abdominal pain.  On exam, she is uncomfortable.  She has significant tenderness to palpation with voluntary guarding.  Lab workup overall reassuring.  Unfortunately, we do not have CT available as the machine is currently broken.  Given her voluntary guarding and new symptoms believe patient warrants a CT of her abdomen and pelvis to rule out acute abdomen.  Patient will be transferred to [facility].  CT abdomen pelvis was ordered.  Dr. Renaye Rakers is the accepting physician.  Patient is in agreement with this plan.  Stable at the time of transfer.  Patient's case discussed with Dr. Deretha Emory who agrees with plan to transfer for imaging.   Note: Portions of this report may have been transcribed using voice recognition software. Every effort was made to ensure accuracy; however, inadvertent computerized transcription errors may still be present.        Final Clinical Impression(s) / ED Diagnoses Final diagnoses:  Generalized abdominal pain    Rx / DC Orders ED Discharge Orders     None         Mora Bellman 06/04/22 2010    Vanetta Mulders, MD 06/05/22 [phone]

## 2022-06-04 NOTE — ED Triage Notes (Signed)
Pt c/o generalized abdominal pain radiating around both sides to back since Tuesday. Pt has hx IBS and gastroparesis. Seen by a doctor a short while PTA who advised that pt come to ER for eval. Pt feels a little nauseated but denies additional symptoms. Scheduled for ultrasound next week per GI doctor.

## 2022-06-04 NOTE — ED Provider Notes (Signed)
Patient is a 67 year old female who was transferred from [facility] for CT imaging.  She has a history of IBS and gastroparesis.  She has been having pain mostly across the upper abdomen and epigastrium.  Her lipase is normal.  No evidence of pancreatitis.  Other labs are nonconcerning.  Her hemoglobin is slightly lower than her baseline.  Rectal exam was performed which showed no gross blood.  Hemoccult negative.  CT scan was performed which showed no acute abnormality.  No evidence of obstruction or colitis.  She was given a GI cocktail.  Will prescribe her Carafate which she has used before.  Will plan to follow-up with her gastroenterologist.  She was discharged home in good condition.  Return precautions were given.   Rolan Bucco, MD 06/04/22 [phone]

## 2022-06-07 ENCOUNTER — Other Ambulatory Visit: Payer: Self-pay

## 2022-06-07 NOTE — Telephone Encounter (Signed)
Patient called stating she is currently just was released from [facility]. Requesting a call back regarding ultrasound that was ordered for Wednesday 6/5. Also states she was instructed to follow up as soon as possible. Please advise, thank you.

## 2022-06-07 NOTE — Telephone Encounter (Signed)
Advised patient the plan is to do the u/s as scheduled, repeat the CBC next week, continue as she was instructed by  the ER provider. Patient asks if she should continue the Carafate because she is already constipated. Patient will continue the medication and start Miralax.  Patient asks if [name] would treat her for SIBO with Xifaxan without test results. Encouraged to follow the current recommendations. Patient asks if she can have a new test kit because she lost the tubes from her other kit. She can pick up a new test kit on Wednesday at the front desk on the 3rd floor.

## 2022-06-07 NOTE — Telephone Encounter (Signed)
Spoke with the patient. She went to the hospital over the weekend. Hgb 11.7, started on Carafate, was told she had "gastritis or a bleeding ulcer." Also "follow up asap." The patient had a CT of the abd/pelvis which did not show any acute process. She is scheduled for her abd u/s this Wednesday. I have advised her to follow ER provider recommendations. Should she keep the u/s appointment?

## 2022-06-09 ENCOUNTER — Ambulatory Visit (HOSPITAL_COMMUNITY)
Admission: RE | Admit: 2022-06-09 | Discharge: 2022-06-09 | Disposition: A | Discharge status: Home / Self Care | Payer: Medicare Other | Source: Ambulatory Visit | Attending: Physician Assistant | Admitting: Physician Assistant

## 2022-06-09 DIAGNOSIS — R109 Unspecified abdominal pain: Secondary | ICD-10-CM | POA: Diagnosis not present

## 2022-06-09 DIAGNOSIS — Z9049 Acquired absence of other specified parts of digestive tract: Secondary | ICD-10-CM | POA: Diagnosis not present

## 2022-06-14 ENCOUNTER — Telehealth: Payer: Self-pay | Admitting: Physician Assistant

## 2022-06-14 ENCOUNTER — Other Ambulatory Visit: Payer: Self-pay | Admitting: Physician Assistant

## 2022-06-14 DIAGNOSIS — R109 Unspecified abdominal pain: Secondary | ICD-10-CM | POA: Diagnosis not present

## 2022-06-14 NOTE — Telephone Encounter (Signed)
Patient reviewed results via mychart following this initial phone call.  Esterwood, [name] S, PA-C     Please let pt know the US shows some fatty changes in liver , otherwise negative -.   Written by Chrystie Nose, RN on 06/11/2022 11:01 AM EDT Seen by patient Ruth Terry on 06/11/2022 11:42 AM

## 2022-06-14 NOTE — Telephone Encounter (Signed)
Inbound call from patient requesting to discuss recent imaging results. Also stated that she has had severe diarrhea all weekend. Requesting a call back to discuss this. Please advise, thank you.

## 2022-06-15 ENCOUNTER — Telehealth: Payer: Self-pay

## 2022-06-15 LAB — CBC/DIFF AMBIGUOUS DEFAULT
Basophils Absolute: 0 10*3/uL (ref 0.0–0.2)
Eos: 3 %
Immature Granulocytes: 0 %
Lymphocytes Absolute: 1.6 10*3/uL (ref 0.7–3.1)
MCH: 27.1 pg (ref 26.6–33.0)
MCHC: 33.5 g/dL (ref 31.5–35.7)
Monocytes Absolute: 0.4 10*3/uL (ref 0.1–0.9)
RBC: 4.58 x10E6/uL (ref 3.77–5.28)
RDW: 13.6 % (ref 11.7–15.4)

## 2022-06-15 LAB — SPECIMEN STATUS REPORT

## 2022-06-15 NOTE — Telephone Encounter (Signed)
Spoke with the patient. She had diarrhea. No diarrhea since yesterday. When we talked on 06/07/22, she was constipated and planned to treat with Miralax. She has stopped the Miralax. Asks for a refill of Linzess. Understands she is not to take Linzess if she has diarrhea.  Patient plans to take the SIBO test if her diarrhea is resolved tomorrow.  Patient states she is concerned that her hgb is 11.7. Asks if she should continue the Carafate. She is not having epigastric pain. Her discomfort is in her lower abdominal area. "Every time I eat, I blow up." Advised if she feels it is helping her, it is okay to continue it for now.  She wants to know if she needs stool tests. "My bottom turns red and irritated every time I have a bowel movement."

## 2022-06-15 NOTE — Telephone Encounter (Signed)
Patient called to follow up from message sent yesterday. Please advise.

## 2022-06-17 ENCOUNTER — Other Ambulatory Visit: Payer: Self-pay

## 2022-06-17 DIAGNOSIS — R110 Nausea: Secondary | ICD-10-CM | POA: Diagnosis not present

## 2022-06-17 DIAGNOSIS — R140 Abdominal distension (gaseous): Secondary | ICD-10-CM | POA: Diagnosis not present

## 2022-06-17 MED ORDER — SUCRALFATE 1 GM/10ML PO SUSP: 1.0000 g | Freq: Three times a day (TID) | ORAL | 0 refills | Status: DC

## 2022-06-17 MED ORDER — HYOSCYAMINE SULFATE 0.125 MG SL SUBL: 0.1250 mg | SUBLINGUAL_TABLET | Freq: Four times a day (QID) | SUBLINGUAL | 0 refills | Status: AC | PRN

## 2022-06-17 NOTE — Telephone Encounter (Signed)
Patient is advised. She expresses her disagreement with this. She is doing the SIBO test today. Advised the results will take a week or more to be available to her.

## 2022-06-28 LAB — CBC/DIFF AMBIGUOUS DEFAULT
Basos: 1 %
EOS (ABSOLUTE): 0.1 10*3/uL (ref 0.0–0.4)
Hematocrit: 37 % (ref 34.0–46.6)
Hemoglobin: 12.4 g/dL (ref 11.1–15.9)
Immature Grans (Abs): 0 10*3/uL (ref 0.0–0.1)
Lymphs: 37 %
MCV: 81 fL (ref 79–97)
Monocytes: 9 %
Neutrophils Absolute: 2.1 10*3/uL (ref 1.4–7.0)
Neutrophils: 50 %
Platelets: 211 10*3/uL (ref 150–450)
WBC: 4.2 10*3/uL (ref 3.4–10.8)

## 2022-07-01 ENCOUNTER — Telehealth: Payer: Self-pay

## 2022-07-01 NOTE — Telephone Encounter (Signed)
-----   Message from Sammuel Cooper, PA-C sent at 07/01/2022 12:48 PM EDT ----- Regarding: breath test result Please notify [employer] that the breath  test for SIBO is negative  Convert to phone note-thanks

## 2022-07-06 NOTE — Telephone Encounter (Signed)
MyChart message sent to patient advising of negative testing.

## 2022-07-23 ENCOUNTER — Other Ambulatory Visit (HOSPITAL_COMMUNITY): Payer: Self-pay

## 2022-07-24 ENCOUNTER — Telehealth: Payer: Self-pay | Admitting: Pharmacy Technician

## 2022-07-24 NOTE — Telephone Encounter (Signed)
Pharmacy Patient Advocate Encounter   Received notification from CoverMyMeds that prior authorization for [facility] is required/requested.   Insurance verification completed.   The patient is insured through [facility] .   Per test claim: [place] submitted to [employer] via CoverMyMeds Key/confirmation #/EOC [id number] Status is pending

## 2022-07-27 ENCOUNTER — Other Ambulatory Visit (HOSPITAL_COMMUNITY): Payer: Self-pay

## 2022-07-27 NOTE — Telephone Encounter (Signed)
Pharmacy Patient Advocate Encounter  Received notification from [facility] that Prior Authorization for Linzess capsules has been APPROVED from 07-25-2022 to 07-24-2023. Ran test claim, Copay is $n/a Filled 07-16-2022 and next fill is 08-08-2022.  PA #/Case ID/Reference #: [id number]

## 2022-08-05 DIAGNOSIS — J069 Acute upper respiratory infection, unspecified: Secondary | ICD-10-CM | POA: Diagnosis not present

## 2022-08-05 DIAGNOSIS — R070 Pain in throat: Secondary | ICD-10-CM | POA: Diagnosis not present

## 2022-08-05 DIAGNOSIS — Z20822 Contact with and (suspected) exposure to covid-19: Secondary | ICD-10-CM | POA: Diagnosis not present

## 2022-08-09 DIAGNOSIS — H40013 Open angle with borderline findings, low risk, bilateral: Secondary | ICD-10-CM | POA: Diagnosis not present

## 2022-08-09 DIAGNOSIS — H4423 Degenerative myopia, bilateral: Secondary | ICD-10-CM | POA: Diagnosis not present

## 2022-08-09 DIAGNOSIS — H25813 Combined forms of age-related cataract, bilateral: Secondary | ICD-10-CM | POA: Diagnosis not present

## 2022-08-10 DIAGNOSIS — K219 Gastro-esophageal reflux disease without esophagitis: Secondary | ICD-10-CM | POA: Diagnosis not present

## 2022-08-10 DIAGNOSIS — R300 Dysuria: Secondary | ICD-10-CM | POA: Diagnosis not present

## 2022-08-10 DIAGNOSIS — F418 Other specified anxiety disorders: Secondary | ICD-10-CM | POA: Diagnosis not present

## 2022-08-10 DIAGNOSIS — I10 Essential (primary) hypertension: Secondary | ICD-10-CM | POA: Diagnosis not present

## 2022-08-27 ENCOUNTER — Ambulatory Visit: Payer: Medicare Other | Admitting: Physician Assistant

## 2022-08-27 ENCOUNTER — Encounter: Payer: Self-pay | Admitting: Physician Assistant

## 2022-08-27 ENCOUNTER — Other Ambulatory Visit (INDEPENDENT_AMBULATORY_CARE_PROVIDER_SITE_OTHER): Payer: Medicare Other

## 2022-08-27 VITALS — BP 118/62 | HR 83 | Ht 59.0 in | Wt 103.0 lb

## 2022-08-27 DIAGNOSIS — G8929 Other chronic pain: Secondary | ICD-10-CM

## 2022-08-27 DIAGNOSIS — K6289 Other specified diseases of anus and rectum: Secondary | ICD-10-CM

## 2022-08-27 DIAGNOSIS — R1013 Epigastric pain: Secondary | ICD-10-CM | POA: Diagnosis not present

## 2022-08-27 DIAGNOSIS — R109 Unspecified abdominal pain: Secondary | ICD-10-CM | POA: Diagnosis not present

## 2022-08-27 DIAGNOSIS — R197 Diarrhea, unspecified: Secondary | ICD-10-CM

## 2022-08-27 DIAGNOSIS — R140 Abdominal distension (gaseous): Secondary | ICD-10-CM

## 2022-08-27 LAB — COMPREHENSIVE METABOLIC PANEL
ALT: 9 U/L (ref 0–35)
AST: 10 U/L (ref 0–37)
Albumin: 4 g/dL (ref 3.5–5.2)
Alkaline Phosphatase: 75 U/L (ref 39–117)
BUN: 11 mg/dL (ref 6–23)
CO2: 28 mEq/L (ref 19–32)
Calcium: 9.1 mg/dL (ref 8.4–10.5)
Chloride: 105 mEq/L (ref 96–112)
Creatinine, Ser: 0.78 mg/dL (ref 0.40–1.20)
GFR: 79.84 mL/min (ref >60.00)
Glucose, Bld: 95 mg/dL (ref 70–99)
Potassium: 3.5 mEq/L (ref 3.5–5.1)
Sodium: 142 mEq/L (ref 135–145)
Total Bilirubin: 0.2 mg/dL (ref 0.2–1.2)
Total Protein: 6.5 g/dL (ref 6.0–8.3)

## 2022-08-27 LAB — LIPASE: Lipase: 70 U/L — ABNORMAL HIGH (ref 11.0–59.0)

## 2022-08-27 LAB — CBC WITH DIFFERENTIAL/PLATELET
Basophils Absolute: 0 10*3/uL (ref 0.0–0.1)
Basophils Relative: 1.1 % (ref 0.0–3.0)
Eosinophils Absolute: 0.1 10*3/uL (ref 0.0–0.7)
Eosinophils Relative: 2.6 % (ref 0.0–5.0)
HCT: 35.8 % — ABNORMAL LOW (ref 36.0–46.0)
Hemoglobin: 11.7 g/dL — ABNORMAL LOW (ref 12.0–15.0)
Lymphocytes Relative: 32.3 % (ref 12.0–46.0)
Lymphs Abs: 1.3 10*3/uL (ref 0.7–4.0)
MCHC: 32.6 g/dL (ref 30.0–36.0)
MCV: 82.1 fl (ref 78.0–100.0)
Monocytes Absolute: 0.4 10*3/uL (ref 0.1–1.0)
Monocytes Relative: 9 % (ref 3.0–12.0)
Neutro Abs: 2.3 10*3/uL (ref 1.4–7.7)
Neutrophils Relative %: 55 % (ref 43.0–77.0)
Platelets: 207 10*3/uL (ref 150.0–400.0)
RBC: 4.36 Mil/uL (ref 3.87–5.11)
RDW: 15 % (ref 11.5–15.5)
WBC: 4.2 10*3/uL (ref 4.0–10.5)

## 2022-08-27 LAB — H. PYLORI ANTIBODY, IGG: H Pylori IgG: NEGATIVE

## 2022-08-27 MED ORDER — SUCRALFATE 1 GM/10ML PO SUSP: 1.0000 g | Freq: Three times a day (TID) | ORAL | 0 refills | Status: AC

## 2022-08-27 NOTE — Progress Notes (Signed)
Subjective:    Patient ID: Ruth Terry, female    DOB: [birth year], 67 y.o.   MRN: [id number]  HPI Ruth Terry is a 67 year old white female, established with Dr.  Also known to myself.  She was last seen in the office December 2023 by myself, and we have had multiple telephone communication since. She has history of coronary artery disease, COPD, IBS, GERD.  She did undergo a cystectomy in September 2023 per Dr. Sophronia Simas and says that she really has not felt well since then.  She has had ongoing complaints of anorectal discomfort.  Generally has chronic constipation symptoms and ongoing dyspeptic symptoms. Over the past months we have adjusted doses of Linzess, she is currently taking 145 mcg daily but is reporting diarrhea.  She continues to take the Linzess despite the diarrhea. She had CT of the abdomen pelvis in May 2024, with no acute findings, and noted sigmoid diverticulosis without diverticulitis.  This was done at the time of an urgent care visit for acute upper abdominal pain. Subsequently had upper abdominal ultrasound done June 2024 which was negative postcholecystectomy, no ductal dilation there was some suggestive hepatic steatosis. Patient had called requesting gastric emptying scan, this was denied by her carrier.  We also did SIBO testing which was negative.  Today she is worried about an ulcer, and worried about bleeding because one of her labs from May 2024 had shown a mildly low hemoglobin of 11.7.  She had subsequent CBC with hemoglobin of 12.4.  Has not noticed any melena or hematochezia.  She continues on Protonix 40 mg p.o. every morning and Pepcid 20 mg every afternoon.  She says she has had ongoing epigastric discomfort and tenderness all summer and feels bloated and uncomfortable when she wakes up in the upper abdomen every morning.  She has not been vomiting.  She feels full and uncomfortable with any sizable meals.  Again describing her stool is loose to watery at  this point and is worried about an underlying infection of some sort. She has burning of her anus with bowel movements, gets intermittent discomfort itching around the perianal area and is also complaining of an ongoing aching type pain generally in the perineum area.  She says she gets relief from sitting on an ice pack at home.  She is also complaining of pain bilaterally in her buttocks more constant in the right and intermittent in the left without radiation to her legs at this point. She says she feels tired and weak.  She does not feel like she has been under an increased amount of stress over the past few months though she says earlier this year was very stressful.  Her husband actually passed away in 12-Apr-2024she is since been dealing with her father who has been ill and in and out of the hospital etc.  She has had some lumbar spine disc issues, she had a an MRI in December 2021 showing a mild broad-based disc bulge at L5-S1 no significant canal or neural foraminal narrowing.  She is generally followed by Dr. Ophelia Charter, has not had any more recent MRI imaging.     Review of Systems. Pertinent positive and negative review of systems were noted in the above HPI section.  All other review of systems was otherwise negative.   Outpatient Encounter Medications as of 08/27/2022  Medication Sig   alfuzosin (UROXATRAL) 10 MG 24 hr tablet Take 1 tablet (10 mg total) by mouth daily with breakfast.  aspirin EC 81 MG tablet Take 1 tablet (81 mg total) by mouth daily. Swallow whole.   Cholecalciferol (VITAMIN D3 PO) Take 1 tablet by mouth daily.   docusate sodium (COLACE) 100 MG capsule Take 1 capsule (100 mg total) by mouth 2 (two) times daily.   doxycycline (VIBRAMYCIN) 100 MG capsule Take 1 capsule (100 mg total) by mouth every 12 (twelve) hours.   DOXYCYCLINE PO Take by mouth.   erythromycin ophthalmic ointment Place 1 Application into the right eye at bedtime.   ezetimibe (ZETIA) 10 MG tablet Take  1 tablet (10 mg total) by mouth daily.   famotidine (PEPCID) 20 MG tablet One after supper   fluticasone (FLONASE) 50 MCG/ACT nasal spray Place 1 spray into both nostrils daily as needed for allergies or rhinitis.   hydrocortisone (ANUSOL-HC) 25 MG suppository Place 1 suppository (25 mg total) rectally at bedtime as needed for hemorrhoids or anal itching. For 5 days   hyoscyamine (LEVSIN SL) 0.125 MG SL tablet Place 1 tablet (0.125 mg total) under the tongue every 6 (six) hours as needed.   levocetirizine (XYZAL) 5 MG tablet Take 1 tablet (5 mg total) by mouth every evening. For allergies   Lido-PE-Mineral Oil-Petrolatum (RECTICARE ADVANCED) 5-0.25-17-39 % CREA Use three times daily as needed   Lidocaine, Anorectal, (RECTICARE) 5 % CREA Apply 1 Application topically as needed.   LINZESS 145 MCG CAPS capsule TAKE 1 CAPSULE BY MOUTH EVERY MORNING BEFORE BREAKFAST   nitroGLYCERIN (NITROSTAT) 0.4 MG SL tablet Place 1 tablet (0.4 mg total) under the tongue every 5 (five) minutes as needed for chest pain.   ofloxacin (OCUFLOX) 0.3 % ophthalmic solution Place 1 drop into the right eye See admin instructions. Instill 1 drop into the right eye every hour   ondansetron (ZOFRAN) 4 MG tablet Take 1 tablet (4 mg total) by mouth every 6 (six) hours as needed for nausea or vomiting.   ondansetron (ZOFRAN) 4 MG tablet Take 1 tablet (4 mg total) by mouth every 8 (eight) hours as needed for nausea or vomiting.   pantoprazole (PROTONIX) 40 MG tablet TAKE 1 TABLET BY MOUTH EVERY MORNING BEFORE BREAKFAST   pravastatin (PRAVACHOL) 10 MG tablet Take 1 tablet (10 mg total) by mouth 3 (three) times a week. Take on Mondays, Wednesdays, and Fridays.   psyllium (METAMUCIL) 58.6 % packet Take 1 packet by mouth every evening.   senna-docusate (SENOKOT S) 8.6-50 MG tablet Take 1-2 tablets by mouth at bedtime.   Simethicone (PHAZYME MAXIMUM STRENGTH) 250 MG CAPS Take 250 mg by mouth 3 (three) times daily. (Patient taking  differently: Take 250 mg by mouth 3 (three) times daily as needed (gas relief).)   sulfamethoxazole-trimethoprim (BACTRIM DS) 800-160 MG tablet Take 1 tablet by mouth every 12 (twelve) hours.   valACYclovir (VALTREX) 1000 MG tablet TAKE 1 TABLET (1,000 MG TOTAL) BY MOUTH AT BEDTIME.   [DISCONTINUED] sucralfate (CARAFATE) 1 GM/10ML suspension Take 10 mLs (1 g total) by mouth 4 (four) times daily -  with meals and at bedtime.   sucralfate (CARAFATE) 1 GM/10ML suspension Take 10 mLs (1 g total) by mouth 3 (three) times daily.   [DISCONTINUED] AMBULATORY NON FORMULARY MEDICATION Medication Name: Domperidone 10 mg Take 1 tablet at bedtime daily. (Patient taking differently: Take 1 tablet by mouth at bedtime. Medication Name: Domperidone 10 mg Take 1 tablet at bedtime daily.)   Facility-Administered Encounter Medications as of 08/27/2022  Medication   methylPREDNISolone acetate (DEPO-MEDROL) injection 40 mg   Allergies  Allergen Reactions   Chlordiazepoxide-Clidinium Itching    (Librax)   Ciprofloxacin Itching    Irritates stomach   Flagyl [Metronidazole]     Severe yeast infection   Sulfa Drugs Cross Reactors Nausea Only   Nitrofurantoin Monohyd Macro Rash   Omnicef [Cefdinir] Nausea And Vomiting   Patient Active Problem List   Diagnosis Date Noted   CAD (coronary artery disease) 07/22/2021   COPD GOLD ? / group B 05/12/2021   Exertional chest pain 05/12/2021   Former smoker 05/12/2021   Generalized abdominal pain 01/02/2021   History of shingles 04/24/2019   Chronic midline low back pain without sciatica 02/27/2019   Chronic idiopathic constipation 04/26/2018   Aortic atherosclerosis (HCC) 03/01/2017   Rib fracture 12/07/2016   Gas pain 03/09/2016   Anal itching 03/09/2016   Elevated LDL cholesterol level 10/24/2015   Internal hemorrhoids 05/14/2014   GERD (gastroesophageal reflux disease) 09/17/2011   IBS (irritable bowel syndrome) 08/25/2010   Gastroparesis 06/30/2010    Non-ulcer dyspepsia 04/03/2010   Social History   Socioeconomic History   Marital status: Married    Spouse name: Not on file   Number of children: 0   Years of education: Not on file   Highest education level: Not on file  Occupational History   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    Employer: [employer]  Tobacco Use   Smoking status: Some Days    Current packs/day: 0.00    Average packs/day: 1 pack/day for 20.0 years (20.0 ttl pk-yrs)    Types: Cigarettes    Start date: 05/2000    Last attempt to quit: 05/2020    Years since quitting: 2.3   Smokeless tobacco: Never   Tobacco comments:    USE SMOKELESS CIGARETTES  Vaping Use   Vaping status: Some Days   Substances: Nicotine  Substance and Sexual Activity   Alcohol use: Not Currently    Comment: 1 a week, wine   Drug use: No   Sexual activity: Not Currently  Other Topics Concern   Not on file  Social History Narrative   Not on file   Social Determinants of Health   Financial Resource Strain: Not on file  Food Insecurity: Not on file  Transportation Needs: Not on file  Physical Activity: Not on file  Stress: Not on file  Social Connections: Not on file  Intimate Partner Violence: Not on file    Ms. Monier's family history includes Diabetes in her father; Heart attack in her father; Hypertension in her mother.      Objective:    Vitals:   08/27/22 1403  BP: 118/62  Pulse: 83    Physical Exam  Well-developed well-nourished thin older WF  in no acute distress.  Height, Weight,103  BMI 20.8  HEENT; nontraumatic normocephalic, EOMI, PE R LA, sclera anicteric. Oropharynx; not examined Neck; supple, no JVD Cardiovascular; regular rate and rhythm with S1-S2, no murmur rub or gallop Pulmonary; Clear bilaterally-she is tender directly over the xiphoid process Abdomen; soft, tenderness in the epigastrium, nondistended, no palpable mass or hepatosplenomegaly, bowel  sounds are active no guarding or rebound Rectal; no perianal rash, irritation or other visible abnormality, no external hemorrhoids, on digital exam she may have a very small internal hemorrhoid, tight anal sphincter, stool heme-negative Skin; benign exam, no jaundice rash or appreciable lesions Extremities; no clubbing cyanosis or edema skin warm and dry Neuro/Psych; alert and oriented x4, grossly nonfocal mood and affect  appropriate        Assessment & Plan:   #69 67 year old white female with history of chronic GERD, and chronic dyspeptic symptoms with persistent complaints of epigastric pain over the past several months, increase in belching, a bloated discomfort early in the mornings on arising, no nausea or vomiting. On exam she is tender over the xiphoid process but also mildly tender across the epigastrium so I cannot be sure this is all musculoskeletal.  I suspect she has functional dyspepsia.  Patient worried about ulcer disease and bleeding, she is heme-negative today, and has been on Protonix and famotidine so therefore is low risk.  She is asking for H. pylori testing. Patient also asking for gastric emptying study-remote diagnosis of gastroparesis  #2 IBS-generally tends towards IBS with constipation, now having diarrhea symptoms on Linzess 145 mcg daily, she had not wanted to stop the Linzess for concerns for constipation.   Concerned about underlying infection  #3 anal burning with bowel movements-query bile salt induced status postcholecystectomy September 2023  #4 ongoing complaints of perianal and perirectal aching type pain and now complaining of bilateral pain in the buttocks right greater than left. I am more concerned about underlying spinal issue perhaps with nerve impingement.  She did have a disc bulge L5-S1 on MRI 2021  Consider pudendal neuralgia, consider levator ani syndrome  #5 colon cancer surveillance-up-to-date with negative colonoscopy 2022 #6  diverticulosis  Plan; CBC with differential, H. pylori IgG [facility] path panel Given her samples of Linzess 72 mcg daily, asked her to reduce the dose and if diarrhea persists stop Linzess Continue Protonix 40 mg p.o. every morning and famotidine 20 mg every afternoon Will refill Carafate suspension 1 g 3 times daily tween meals x 1 month Schedule for gastric emptying scan I have asked her to make an appointment with her orthopedist/Dr. Ophelia Charter to consider repeat lumbosacral/pelvic MRI. Have considered neuromodulator therapy for her chronic pain, if above workup all negative will consider trial of Cymbalta, versus BuSpar     Plan;  Sammuel Cooper PA-C 08/27/2022   Cc: Rebekah Chesterfield, NP

## 2022-08-27 NOTE — Patient Instructions (Addendum)
_______________________________________________________  If your blood pressure at your visit was 140/90 or greater, please contact your primary care physician to follow up on this. _______________________________________________________  If you are age 65 or older, your body mass index should be between 23-30. Your Body mass index is 20.8 kg/m. If this is out of the aforementioned range listed, please consider follow up with your Primary Care Provider.  The [facility] providers would like to encourage you to use [facility] to communicate with providers for non-urgent requests or questions.  Due to long hold times on the telephone, sending your provider a message by [facility] may be a faster and more efficient way to get a response.  Please allow 48 business hours for a response.  Please remember that this is for non-urgent requests.  _______________________________________________________  Your provider has requested that you go to the basement level for lab work before leaving today. Press "B" on the elevator. The lab is located at the first door on the left as you exit the elevator.  DECREASE: Linzess to daily.  If diarrhea persists, then discontinue.  Linzess works best when taken once a day every day, on an empty stomach, at least 30 minutes before your first meal of the day.  When Linzess is taken daily as directed:  *Constipation relief is typically felt in about a week *IBS-C patients may begin to experience relief from belly pain and overall abdominal symptoms (pain, discomfort, and bloating) in about 1 week,   with symptoms typically improving over 12 weeks.  Diarrhea may occur in the first 2 weeks -keep taking it.  The diarrhea should go away and you should start having normal, complete, full bowel movements. It may be helpful to start treatment when you can be near the comfort of your own bathroom, such as a weekend.   CONTINUE Protonix 40mg  once daily CONTINUE Pepcid 20mg   daily  We have sent the following medications to your pharmacy for you to pick up at your convenience:  START: Carafate 1gm suspension 3 times daily between meals for 1 month.  Due to recent changes in healthcare laws, you may see the results of your imaging and laboratory studies on MyChart before your provider has had a chance to review them.  We understand that in some cases there may be results that are confusing or concerning to you. Not all laboratory results come back in the same time frame and the provider may be waiting for multiple results in order to interpret others.  Please give [place] 48 hours in order for your provider to thoroughly review all the results before contacting the office for clarification of your results.   You have been scheduled for a gastric emptying scan at [facility] on 08-31-22 at 8am. Please arrive at least 30 minutes prior to your appointment for registration. Please make certain not to have anything to eat or drink after midnight the night before your test. Hold all stomach medications (ex: Zofran, phenergan, Reglan) 24 hours prior to your test. If you need to reschedule your appointment, please contact radiology scheduling at [phone]. _____________________________________________________________________ A gastric-emptying study measures how long it takes for food to move through your stomach. There are several ways to measure stomach emptying. In the most common test, you eat food that contains a small amount of radioactive material. A scanner that detects the movement of the radioactive material is placed over your abdomen to monitor the rate at which food leaves your stomach. This test  normally takes about 4 hours to complete. _____________________________________________________________________  Thank you for entrusting me with your care and choosing [facility].  [name] Esterwood, PA-C

## 2022-08-27 NOTE — Addendum Note (Signed)
Addended by: Trellis Paganini D on: 08/27/2022 03:18 PM   Modules accepted: Orders

## 2022-08-27 NOTE — Addendum Note (Signed)
Addended by: Trellis Paganini D on: 08/27/2022 03:16 PM   Modules accepted: Orders

## 2022-08-29 NOTE — Progress Notes (Signed)
Noted  

## 2022-08-30 ENCOUNTER — Other Ambulatory Visit: Payer: Self-pay

## 2022-08-30 DIAGNOSIS — H25811 Combined forms of age-related cataract, right eye: Secondary | ICD-10-CM | POA: Diagnosis not present

## 2022-08-30 DIAGNOSIS — H33321 Round hole, right eye: Secondary | ICD-10-CM | POA: Diagnosis not present

## 2022-08-30 DIAGNOSIS — D582 Other hemoglobinopathies: Secondary | ICD-10-CM

## 2022-08-30 DIAGNOSIS — H25812 Combined forms of age-related cataract, left eye: Secondary | ICD-10-CM | POA: Diagnosis not present

## 2022-08-30 DIAGNOSIS — H2521 Age-related cataract, morgagnian type, right eye: Secondary | ICD-10-CM | POA: Diagnosis not present

## 2022-08-30 DIAGNOSIS — H4423 Degenerative myopia, bilateral: Secondary | ICD-10-CM | POA: Diagnosis not present

## 2022-08-31 ENCOUNTER — Encounter (HOSPITAL_COMMUNITY): Payer: Medicare Other

## 2022-09-01 ENCOUNTER — Other Ambulatory Visit: Payer: Self-pay | Admitting: Physician Assistant

## 2022-09-01 DIAGNOSIS — G8929 Other chronic pain: Secondary | ICD-10-CM | POA: Diagnosis not present

## 2022-09-01 DIAGNOSIS — R109 Unspecified abdominal pain: Secondary | ICD-10-CM | POA: Diagnosis not present

## 2022-09-01 DIAGNOSIS — K6289 Other specified diseases of anus and rectum: Secondary | ICD-10-CM | POA: Diagnosis not present

## 2022-09-01 DIAGNOSIS — R140 Abdominal distension (gaseous): Secondary | ICD-10-CM | POA: Diagnosis not present

## 2022-09-02 ENCOUNTER — Ambulatory Visit (INDEPENDENT_AMBULATORY_CARE_PROVIDER_SITE_OTHER): Payer: Medicare Other | Admitting: Orthopaedic Surgery

## 2022-09-02 ENCOUNTER — Encounter: Payer: Self-pay | Admitting: Orthopaedic Surgery

## 2022-09-02 ENCOUNTER — Other Ambulatory Visit (INDEPENDENT_AMBULATORY_CARE_PROVIDER_SITE_OTHER): Payer: Medicare Other

## 2022-09-02 VITALS — Ht 59.0 in | Wt 105.0 lb

## 2022-09-02 DIAGNOSIS — M79604 Pain in right leg: Secondary | ICD-10-CM

## 2022-09-02 DIAGNOSIS — M545 Low back pain, unspecified: Secondary | ICD-10-CM

## 2022-09-02 MED ORDER — PREDNISONE 5 MG (21) PO TBPK: ORAL_TABLET | ORAL | 0 refills | Status: DC

## 2022-09-02 NOTE — Addendum Note (Signed)
Addended by: Jodene Nam A on: 09/02/2022 12:02 PM   Modules accepted: Orders

## 2022-09-02 NOTE — Progress Notes (Signed)
My back hurts.  She has had lower back pain for several months not getting worse.  She has some right sided radiation past the knee to the lateral right foot.  She has no trauma, no weakness.  She has tried Tylenol, heat, ice with little help.  She cannot take NSAIDs.  She has tried stretching but that made her worse.  Lower back is diffusely tender, ROM lateral bilateral is normal, extension 5, forward 30, no spasm, muscle tone and strength normal, NV intact, gait normal.  X-rays were done of the lumbar spine, reported separately.  Encounter Diagnosis  Name Primary?   Lumbar pain with radiation down right leg Yes   I will begin prednisone dose pack.  I will schedule MRI of the lumbar spine.  Return in three weeks.  Call if any problem.  Precautions discussed.  Electronically Signed Darreld Mclean, MD 8/29/202411:54 AM

## 2022-09-04 LAB — GI PROFILE, STOOL, PCR

## 2022-09-07 ENCOUNTER — Telehealth: Payer: Self-pay

## 2022-09-07 NOTE — Telephone Encounter (Signed)
GI pathogen profile results sent to fax by [facility].  Left in [name]'s Inbox for her to review.

## 2022-09-11 ENCOUNTER — Encounter (HOSPITAL_COMMUNITY): Payer: Self-pay

## 2022-09-11 ENCOUNTER — Ambulatory Visit (HOSPITAL_COMMUNITY): Payer: Medicare Other

## 2022-09-11 DIAGNOSIS — H10013 Acute follicular conjunctivitis, bilateral: Secondary | ICD-10-CM | POA: Diagnosis not present

## 2022-09-14 ENCOUNTER — Telehealth: Payer: Self-pay | Admitting: Physician Assistant

## 2022-09-14 NOTE — Telephone Encounter (Signed)
Inbound call from patient stating the last time she came in to see [name] she changed some medications and since then her symptoms have worsened.  Patient stated that [name] wanted to do blood work as well, orders were placed but looks like patient has not had them drawn. Patient is requesting a call to discuss. Please advise.

## 2022-09-15 DIAGNOSIS — H33321 Round hole, right eye: Secondary | ICD-10-CM | POA: Diagnosis not present

## 2022-09-15 NOTE — Telephone Encounter (Signed)
Spoke with patient. She is presently at an outpatient center "waiting to have my eye worked on. I have a hole in my retina." She also reports she had to cancel the GES.  She took Linzess 72 mcg for 1 week. Changed back to Linzess 145 mcg because the lower dosage "did absolutely nothing and Miralax doesn't work for me." She has taken the Linzess 145 mcg x 2 days. She had diarrhea yesterday. Today she has an "upset stomach." She describes "cramping all over my stomach, upper and lower." States "I cannot take Carafate 3 times a day because it makes me constipated. I think I have an ulcer." She plans to get her labs drawn at [facility].

## 2022-09-19 ENCOUNTER — Ambulatory Visit
Admission: EM | Admit: 2022-09-19 | Discharge: 2022-09-19 | Disposition: A | Discharge status: Home / Self Care | Payer: Medicare Other | Attending: Nurse Practitioner | Admitting: Nurse Practitioner

## 2022-09-19 ENCOUNTER — Encounter: Payer: Self-pay | Admitting: Emergency Medicine

## 2022-09-19 DIAGNOSIS — M545 Low back pain, unspecified: Secondary | ICD-10-CM | POA: Diagnosis not present

## 2022-09-19 DIAGNOSIS — R350 Frequency of micturition: Secondary | ICD-10-CM | POA: Diagnosis not present

## 2022-09-19 LAB — POCT URINALYSIS DIP (MANUAL ENTRY)
Bilirubin, UA: NEGATIVE
Blood, UA: NEGATIVE
Glucose, UA: NEGATIVE mg/dL
Ketones, POC UA: NEGATIVE mg/dL
Leukocytes, UA: NEGATIVE
Nitrite, UA: NEGATIVE
Protein Ur, POC: NEGATIVE mg/dL
Spec Grav, UA: 1.01 (ref 1.010–1.025)
Urobilinogen, UA: 0.2 U/dL
pH, UA: 6 (ref 5.0–8.0)

## 2022-09-19 NOTE — ED Provider Notes (Signed)
[facility]    CSN: [id number] Arrival date & time: 09/19/22  1335      History   Chief Complaint No chief complaint on file.   HPI Ruth Terry is a 67 y.o. female.   The history is provided by the patient.   Patient presents for complaints of lower back pain and urinary frequency that been present for the past 2 days.  She denies fever, chills, pain with urination, hesitancy, urgency, hematuria, decreased urine stream, flank pain, injury or trauma.  She reports that she does have a history of gastritis, and is currently experiencing a "flare".  Patient reports that her last UTI was approximately 6 to 8 months ago.  Past Medical History:  Diagnosis Date   Allergy    Anal fissure    Cataract    Chronic constipation    Coronary artery disease    Diverticulosis 04/20/2010   colonoscopy   Gastroparesis    GERD (gastroesophageal reflux disease)    Hemorrhoids    Hemorrhoids    Hyperlipidemia    IBS (irritable bowel syndrome)    MVP (mitral valve prolapse)    as a teenager per patient, no issues since   Osteoporosis    Polyp, stomach 04/20/2010   egd   Shingles    UTI (lower urinary tract infection)     Patient Active Problem List   Diagnosis Date Noted   CAD (coronary artery disease) 07/22/2021   COPD GOLD ? / group B 05/12/2021   Exertional chest pain 05/12/2021   Former smoker 05/12/2021   Generalized abdominal pain 01/02/2021   History of shingles 04/24/2019   Chronic midline low back pain without sciatica 02/27/2019   Chronic idiopathic constipation 04/26/2018   Aortic atherosclerosis (HCC) 03/01/2017   Rib fracture 12/07/2016   Gas pain 03/09/2016   Anal itching 03/09/2016   Elevated LDL cholesterol level 10/24/2015   Internal hemorrhoids 05/14/2014   GERD (gastroesophageal reflux disease) 09/17/2011   IBS (irritable bowel syndrome) 08/25/2010   Gastroparesis 06/30/2010   Non-ulcer dyspepsia 04/03/2010    Past Surgical History:   Procedure Laterality Date   CHOLECYSTECTOMY N/A 09/21/2021   Procedure: LAPAROSCOPIC CHOLECYSTECTOMY;  Surgeon: Fritzi Mandes, MD;  Location: [facility] OR;  Service: General;  Laterality: N/A;   COLONOSCOPY     CORONARY PRESSURE/FFR STUDY N/A 07/22/2021   Procedure: INTRAVASCULAR PRESSURE WIRE/FFR STUDY;  Surgeon: Lyn Records, MD;  Location: [facility];  Service: Cardiovascular;  Laterality: N/A;   ESOPHAGOGASTRODUODENOSCOPY     LEFT HEART CATH AND CORONARY ANGIOGRAPHY N/A 07/22/2021   Procedure: LEFT HEART CATH AND CORONARY ANGIOGRAPHY;  Surgeon: Lyn Records, MD;  Location: [facility];  Service: Cardiovascular;  Laterality: N/A;    OB History     Gravida  0   Para  0   Term  0   Preterm  0   AB  0   Living  0      SAB  0   IAB  0   Ectopic  0   Multiple  0   Live Births  0            Home Medications    Prior to Admission medications   Medication Sig Start Date End Date Taking? Authorizing Provider  alfuzosin (UROXATRAL) 10 MG 24 hr tablet Take 1 tablet (10 mg total) by mouth daily with breakfast. 09/02/21   Summerlin, Regan Rakers, PA-C  aspirin EC 81 MG tablet Take 1  tablet (81 mg total) by mouth daily. Swallow whole. 07/17/21   Kathleene Hazel, MD  Cholecalciferol (VITAMIN D3 PO) Take 1 tablet by mouth daily.    [provider]  docusate sodium (COLACE) 100 MG capsule Take 1 capsule (100 mg total) by mouth 2 (two) times daily. 09/02/21   Summerlin, Regan Rakers, PA-C  erythromycin ophthalmic ointment Place 1 Application into the right eye at bedtime. 09/14/21   [provider]  ezetimibe (ZETIA) 10 MG tablet Take 1 tablet (10 mg total) by mouth daily. 07/27/21   Lyn Records, MD  famotidine (PEPCID) 20 MG tablet One after supper 05/12/21   Nyoka Cowden, MD  fluticasone [facility]) 50 MCG/ACT nasal spray Place 1 spray into both nostrils daily as needed for allergies or rhinitis.    [provider]   hydrocortisone (ANUSOL-HC) 25 MG suppository Place 1 suppository (25 mg total) rectally at bedtime as needed for hemorrhoids or anal itching. For 5 days 03/21/22   Slevin Gunby-Warren, Sadie Haber, NP  hyoscyamine (LEVSIN SL) 0.125 MG SL tablet Place 1 tablet (0.125 mg total) under the tongue every 6 (six) hours as needed. 06/17/22   Esterwood, [name] S, PA-C  levocetirizine (XYZAL) 5 MG tablet Take 1 tablet (5 mg total) by mouth every evening. For allergies 08/22/20   Delynn Flavin M, DO  Lido-PE-Mineral Oil-Petrolatum [facility] ADVANCED) 5-0.25-17-39 % CREA Use three times daily as needed 12/09/21   Esterwood, [name] S, PA-C  Lidocaine, Anorectal, (RECTICARE) 5 % CREA Apply 1 Application topically as needed. 03/21/22   Asami Lambright-Warren, Sadie Haber, NP  LINZESS 145 MCG CAPS capsule TAKE 1 CAPSULE BY MOUTH EVERY MORNING BEFORE BREAKFAST 05/17/22   Esterwood, [name] S, PA-C  nitroGLYCERIN (NITROSTAT) 0.4 MG SL tablet Place 1 tablet (0.4 mg total) under the tongue every 5 (five) minutes as needed for chest pain. 07/17/21   Kathleene Hazel, MD  ofloxacin (OCUFLOX) 0.3 % ophthalmic solution Place 1 drop into the right eye See admin instructions. Instill 1 drop into the right eye every hour 09/14/21   [provider]  pantoprazole (PROTONIX) 40 MG tablet TAKE 1 TABLET BY MOUTH EVERY MORNING BEFORE BREAKFAST 04/30/22   Esterwood, [name] S, PA-C  pravastatin (PRAVACHOL) 10 MG tablet Take 1 tablet (10 mg total) by mouth 3 (three) times a week. Take on Mondays, Wednesdays, and Fridays. 12/14/21   Lyn Records, MD  psyllium (METAMUCIL) 58.6 % packet Take 1 packet by mouth every evening.    [provider]  senna-docusate (SENOKOT S) 8.6-50 MG tablet Take 1-2 tablets by mouth at bedtime. 12/09/21   Esterwood, [name] S, PA-C  Simethicone (PHAZYME MAXIMUM STRENGTH) 250 MG CAPS Take 250 mg by mouth 3 (three) times daily. Patient taking differently: Take 250 mg by mouth 3 (three) times daily as needed (gas relief).  02/20/16   Remus Loffler, PA-C  sucralfate (CARAFATE) 1 GM/10ML suspension Take 10 mLs (1 g total) by mouth 3 (three) times daily. 08/27/22   Esterwood, [name] S, PA-C  valACYclovir (VALTREX) 1000 MG tablet TAKE 1 TABLET (1,000 MG TOTAL) BY MOUTH AT BEDTIME. 01/26/21   Daphine Deutscher, Mary-Margaret, FNP  AMBULATORY NON FORMULARY MEDICATION Medication Name: Domperidone 10 mg Take 1 tablet at bedtime daily. Patient taking differently: Take 1 tablet by mouth at bedtime. Medication Name: Domperidone 10 mg Take 1 tablet at bedtime daily. 09/08/16 05/15/19  Esterwood, Wayne Both, PA-C    Family History Family History  Problem Relation Age of Onset   Hypertension Mother  Diabetes Father    Heart attack Father        Age 105   Colon cancer Neg Hx    Esophageal cancer Neg Hx    Rectal cancer Neg Hx     Social History Social History   Tobacco Use   Smoking status: Some Days    Current packs/day: 0.00    Average packs/day: 1 pack/day for 20.0 years (20.0 ttl pk-yrs)    Types: Cigarettes    Start date: 05/2000    Last attempt to quit: 05/2020    Years since quitting: 2.3   Smokeless tobacco: Never   Tobacco comments:    USE SMOKELESS CIGARETTES  Vaping Use   Vaping status: Some Days   Substances: Nicotine  Substance Use Topics   Alcohol use: Not Currently    Comment: 1 a week, wine   Drug use: No     Allergies   Chlordiazepoxide-clidinium, Ciprofloxacin, Flagyl [metronidazole], Sulfa drugs cross reactors, Nitrofurantoin monohyd macro, and Omnicef [cefdinir]   Review of Systems Review of Systems Per HPI  Physical Exam Triage Vital Signs ED Triage Vitals  Encounter Vitals Group     BP 09/19/22 1400 121/78     Systolic BP Percentile --      Diastolic BP Percentile --      Pulse Rate 09/19/22 1400 (!) 102     Resp 09/19/22 1400 16     Temp 09/19/22 1400 98.5 F (36.9 C)     Temp Source 09/19/22 1400 Oral     SpO2 09/19/22 1400 99 %     Weight --      Height --      Head Circumference  --      Peak Flow --      Pain Score 09/19/22 1401 5     Pain Loc --      Pain Education --      Exclude from Growth Chart --    No data found.  Updated Vital Signs BP 121/78 (BP Location: Right Arm)   Pulse (!) 102   Temp 98.5 F (36.9 C) (Oral)   Resp 16   SpO2 99%   Visual Acuity Right Eye Distance:   Left Eye Distance:   Bilateral Distance:    Right Eye Near:   Left Eye Near:    Bilateral Near:     Physical Exam Vitals and nursing note reviewed.  Constitutional:      General: She is not in acute distress.    Appearance: Normal appearance.  HENT:     Head: Normocephalic.  Eyes:     Extraocular Movements: Extraocular movements intact.     Conjunctiva/sclera: Conjunctivae normal.     Pupils: Pupils are equal, round, and reactive to light.  Cardiovascular:     Rate and Rhythm: Normal rate and regular rhythm.     Pulses: Normal pulses.     Heart sounds: Normal heart sounds.  Pulmonary:     Effort: Pulmonary effort is normal.     Breath sounds: Normal breath sounds.  Abdominal:     General: Abdomen is flat. Bowel sounds are normal.     Palpations: Abdomen is soft.     Tenderness: There is generalized abdominal tenderness. There is no right CVA tenderness or left CVA tenderness.  Musculoskeletal:     Cervical back: Normal range of motion.  Lymphadenopathy:     Cervical: No cervical adenopathy.  Skin:    General: Skin is warm and dry.  Neurological:  General: No focal deficit present.     Mental Status: She is alert and oriented to person, place, and time.  Psychiatric:        Mood and Affect: Mood normal.        Behavior: Behavior normal.      UC Treatments / Results  Labs (all labs ordered are listed, but only abnormal results are displayed) Labs Reviewed  POCT URINALYSIS DIP (MANUAL ENTRY)    EKG   Radiology No results found.  Procedures Procedures (including critical care time)  Medications Ordered in UC Medications - No data to  display  Initial Impression / Assessment and Plan / UC Course  I have reviewed the triage vital signs and the nursing notes.  Pertinent labs & imaging results that were available during my care of the patient were reviewed by me and considered in my medical decision making (see chart for details).  The patient is well-appearing, she is in no acute distress, vital signs are stable.  Urinalysis is negative.  No indication for culture at this time.  Patient was advised of same.  Patient was advised that she may be able to try AZO to see if this helps her symptoms.  Supportive care recommendations were provided and discussed with the patient to include over-the-counter Tylenol, increasing her fluid intake, and following up with her primary care physician for further evaluation.  Patient is in agreement with this plan of care and verbalizes understanding.  All questions were answered.  Patient is stable for discharge.   Final Clinical Impressions(s) / UC Diagnoses   Final diagnoses:  Urinary frequency  Bilateral low back pain without sciatica, unspecified chronicity     Discharge Instructions      The urinalysis was negative. You may take over-the-counter Azo to see if this helps with your symptoms.  If symptoms do not improve with this treatment, please follow-up with your primary care physician for further evaluation. May take Tylenol as needed for pain or discomfort. Continue to drink plenty of fluids, preferably at least 5-7 8 ounce glasses of water daily. Follow-up as needed.     ED Prescriptions   None    PDMP not reviewed this encounter.   Abran Cantor, NP 09/19/22 1418

## 2022-09-19 NOTE — ED Triage Notes (Signed)
Lower back pain and urinary frequency x 2 days

## 2022-09-19 NOTE — Discharge Instructions (Signed)
The urinalysis was negative. You may take over-the-counter Azo to see if this helps with your symptoms.  If symptoms do not improve with this treatment, please follow-up with your primary care physician for further evaluation. May take Tylenol as needed for pain or discomfort. Continue to drink plenty of fluids, preferably at least 5-7 8 ounce glasses of water daily. Follow-up as needed.

## 2022-09-21 ENCOUNTER — Telehealth: Payer: Self-pay | Admitting: Orthopaedic Surgery

## 2022-09-21 NOTE — Telephone Encounter (Signed)
I have received fax lumbar MRI denied she needs 6 weeks conservative treatment first with Physical therapy  I gave the letter to Tori  Do you want to appeal or send for the physical therapy and try again in 6 weeks?

## 2022-09-21 NOTE — Telephone Encounter (Signed)
Spoke with the patient. Advised per Mike Gip, okay to change Linzess to whatever dosage she is most comfortable with. Stop Carafate if she is not tolerating it. Reschedule the gastric emptying study. Patient agrees to this and will take care of it as soon as she returns from vacation.  Agrees to referral for tertiary care center for further GI input.

## 2022-09-24 ENCOUNTER — Telehealth: Payer: Self-pay

## 2022-09-24 NOTE — Telephone Encounter (Signed)
Spoke to EL L at [employer] about appeal for [place] and they no longer have P@P  they have an Appeal and also expadited appeal that is in 72 hours  I done the 72 hour appeal and sending in medical documentation

## 2022-09-27 ENCOUNTER — Telehealth: Payer: Self-pay | Admitting: Orthopaedic Surgery

## 2022-09-27 NOTE — Telephone Encounter (Signed)
Dr. Sanjuan Dame pt - Fleet Contras w/[employer] w/the Medicare Appeals Department [phone] lvm stating that someone had called from our office to file an expedited appeal for her MRI of the spine, however they have not received any clinical information to help support that case.  They need this information no later than 10am TODAY, f# [phone].

## 2022-10-01 ENCOUNTER — Ambulatory Visit (HOSPITAL_COMMUNITY)
Admission: RE | Admit: 2022-10-01 | Discharge: 2022-10-01 | Disposition: A | Discharge status: Home / Self Care | Payer: Medicare Other | Source: Ambulatory Visit | Attending: Orthopaedic Surgery | Admitting: Orthopaedic Surgery

## 2022-10-01 DIAGNOSIS — M79604 Pain in right leg: Secondary | ICD-10-CM | POA: Diagnosis not present

## 2022-10-01 DIAGNOSIS — M4187 Other forms of scoliosis, lumbosacral region: Secondary | ICD-10-CM | POA: Diagnosis not present

## 2022-10-01 DIAGNOSIS — M545 Low back pain, unspecified: Secondary | ICD-10-CM | POA: Insufficient documentation

## 2022-10-01 DIAGNOSIS — M47816 Spondylosis without myelopathy or radiculopathy, lumbar region: Secondary | ICD-10-CM | POA: Diagnosis not present

## 2022-10-08 ENCOUNTER — Ambulatory Visit (HOSPITAL_COMMUNITY)
Admission: RE | Admit: 2022-10-08 | Discharge: 2022-10-08 | Disposition: A | Discharge status: Home / Self Care | Payer: Medicare Other | Source: Ambulatory Visit | Attending: Acute Care | Admitting: Acute Care

## 2022-10-08 DIAGNOSIS — Z122 Encounter for screening for malignant neoplasm of respiratory organs: Secondary | ICD-10-CM | POA: Diagnosis not present

## 2022-10-08 DIAGNOSIS — J432 Centrilobular emphysema: Secondary | ICD-10-CM | POA: Diagnosis not present

## 2022-10-08 DIAGNOSIS — I700 Atherosclerosis of aorta: Secondary | ICD-10-CM | POA: Diagnosis not present

## 2022-10-08 DIAGNOSIS — Z87891 Personal history of nicotine dependence: Secondary | ICD-10-CM | POA: Diagnosis not present

## 2022-10-08 DIAGNOSIS — R918 Other nonspecific abnormal finding of lung field: Secondary | ICD-10-CM | POA: Diagnosis not present

## 2022-10-11 DIAGNOSIS — H2521 Age-related cataract, morgagnian type, right eye: Secondary | ICD-10-CM | POA: Diagnosis not present

## 2022-10-11 DIAGNOSIS — H43393 Other vitreous opacities, bilateral: Secondary | ICD-10-CM | POA: Diagnosis not present

## 2022-10-11 DIAGNOSIS — H33321 Round hole, right eye: Secondary | ICD-10-CM | POA: Diagnosis not present

## 2022-10-11 DIAGNOSIS — H31091 Other chorioretinal scars, right eye: Secondary | ICD-10-CM | POA: Diagnosis not present

## 2022-10-12 ENCOUNTER — Other Ambulatory Visit (HOSPITAL_COMMUNITY)
Admission: RE | Admit: 2022-10-12 | Discharge: 2022-10-12 | Disposition: A | Discharge status: Home / Self Care | Payer: Medicare Other | Source: Ambulatory Visit | Attending: Physician Assistant | Admitting: Physician Assistant

## 2022-10-12 DIAGNOSIS — R197 Diarrhea, unspecified: Secondary | ICD-10-CM | POA: Insufficient documentation

## 2022-10-12 DIAGNOSIS — G8929 Other chronic pain: Secondary | ICD-10-CM | POA: Insufficient documentation

## 2022-10-12 DIAGNOSIS — R1013 Epigastric pain: Secondary | ICD-10-CM | POA: Diagnosis not present

## 2022-10-12 DIAGNOSIS — R140 Abdominal distension (gaseous): Secondary | ICD-10-CM | POA: Diagnosis not present

## 2022-10-12 DIAGNOSIS — D649 Anemia, unspecified: Secondary | ICD-10-CM | POA: Insufficient documentation

## 2022-10-12 DIAGNOSIS — K6289 Other specified diseases of anus and rectum: Secondary | ICD-10-CM | POA: Diagnosis not present

## 2022-10-12 DIAGNOSIS — D582 Other hemoglobinopathies: Secondary | ICD-10-CM | POA: Diagnosis not present

## 2022-10-12 DIAGNOSIS — R109 Unspecified abdominal pain: Secondary | ICD-10-CM | POA: Diagnosis not present

## 2022-10-12 LAB — CBC WITH DIFFERENTIAL/PLATELET
Abs Immature Granulocytes: 0.01 10*3/uL (ref 0.00–0.07)
Basophils Absolute: 0 10*3/uL (ref 0.0–0.1)
Basophils Relative: 1 %
Eosinophils Absolute: 0.2 10*3/uL (ref 0.0–0.5)
Eosinophils Relative: 5 %
HCT: 39.1 % (ref 36.0–46.0)
Hemoglobin: 12.1 g/dL (ref 12.0–15.0)
Immature Granulocytes: 0 %
Lymphocytes Relative: 28 %
Lymphs Abs: 1.2 10*3/uL (ref 0.7–4.0)
MCH: 26.4 pg (ref 26.0–34.0)
MCHC: 30.9 g/dL (ref 30.0–36.0)
MCV: 85.2 fL (ref 80.0–100.0)
Monocytes Absolute: 0.4 10*3/uL (ref 0.1–1.0)
Monocytes Relative: 8 %
Neutro Abs: 2.5 10*3/uL (ref 1.7–7.7)
Neutrophils Relative %: 58 %
Platelets: 208 10*3/uL (ref 150–400)
RBC: 4.59 MIL/uL (ref 3.87–5.11)
RDW: 14 % (ref 11.5–15.5)
WBC: 4.3 10*3/uL (ref 4.0–10.5)
nRBC: 0 % (ref 0.0–0.2)

## 2022-10-12 LAB — IRON AND TIBC
Iron: 38 ug/dL (ref 28–170)
Saturation Ratios: 7 % — ABNORMAL LOW (ref 10.4–31.8)
TIBC: 514 ug/dL — ABNORMAL HIGH (ref 250–450)
UIBC: 476 ug/dL

## 2022-10-12 LAB — VITAMIN B12: Vitamin B-12: 238 pg/mL (ref 180–914)

## 2022-10-12 LAB — FERRITIN: Ferritin: 4 ng/mL — ABNORMAL LOW (ref 11–307)

## 2022-10-12 LAB — FOLATE: Folate: 8.6 ng/mL (ref >5.9)

## 2022-10-13 ENCOUNTER — Telehealth: Payer: Self-pay | Admitting: Physician Assistant

## 2022-10-13 NOTE — Telephone Encounter (Signed)
Patient called back, is highly concerned abut the lab results in MyChart. IS asking for a call back as soon as possible.

## 2022-10-13 NOTE — Telephone Encounter (Signed)
Inbound call from patient stating that she had lab work done and got some results back on Mychart and she is concerned. Requesting a call from nurse to discuss. Please advise.

## 2022-10-14 NOTE — Telephone Encounter (Signed)
Inbound call from patient, following up on call below. States she is very concerned.

## 2022-10-14 NOTE — Telephone Encounter (Signed)
Patient is concerned about her low ferritin. She is very fatigued and does not feel good. She wants a referral to hematology to Dr Elizebeth Koller. She said Her father has "this" and she was told taking an iron supplement does no good for low ferritin.

## 2022-10-18 ENCOUNTER — Telehealth: Payer: Self-pay | Admitting: Physician Assistant

## 2022-10-18 ENCOUNTER — Other Ambulatory Visit: Payer: Self-pay

## 2022-10-18 DIAGNOSIS — R790 Abnormal level of blood mineral: Secondary | ICD-10-CM

## 2022-10-18 NOTE — Telephone Encounter (Signed)
Referral to [facility] placed. Referral note includes the request for [facility]

## 2022-10-18 NOTE — Telephone Encounter (Signed)
Inbound call from patient requesting to speak with a nurse in regards to blood work. Please advise.   Thank you

## 2022-10-19 DIAGNOSIS — H268 Other specified cataract: Secondary | ICD-10-CM | POA: Diagnosis not present

## 2022-10-19 DIAGNOSIS — H25811 Combined forms of age-related cataract, right eye: Secondary | ICD-10-CM | POA: Diagnosis not present

## 2022-10-25 DIAGNOSIS — H25812 Combined forms of age-related cataract, left eye: Secondary | ICD-10-CM | POA: Diagnosis not present

## 2022-10-25 NOTE — Progress Notes (Signed)
[facility] [address], [place] [address]   Clinic Day:  10/26/2022  Referring physician: Sammuel Cooper, PA-C  Patient Care Team: Rebekah Chesterfield, NP as PCP - General (Internal Medicine) Lyn Records, MD (Inactive) as PCP - Cardiology (Cardiology) Danella Maiers, [facility] (Pharmacist) Peterson Ao as Physician Assistant (Gastroenterology) Eldred Manges, MD as Consulting Physician (Orthopedic Surgery)   ASSESSMENT & PLAN:   Assessment:  1.  Severe iron deficiency state: - 10/12/2022: Ferritin 4, percent saturation 7. - Reports decreased energy for 4-5 months. - She has not taken iron tablet as she has gastroparesis, GERD and IBS.  Denies any ice pica.  No transfusion history.  Denies BRBPR/melena.  Reports occasional dizziness. - Colonoscopy (12/09/2020): Few diverticula in the sigmoid colon and internal hemorrhoids. - EGD (12/09/2020): Normal esophagus, stomach and duodenum.  2. Social/Family History: -Semi-retired as a Personal assistant, working at home. Tobacco use of 5 cigarettes a day for 23 years.  -Father receives iron infusions at [facility]. No family history of cancer.   Plan:  1.  Severe iron deficiency state: - We reviewed labs from 10/12/2022. - She cannot take oral iron therapy due to her GI problems. - We discussed role of parenteral iron therapy with Feraheme weekly x 2.  We discussed side effects including rare chance of anaphylactic reaction. - She will be scheduled for parenteral iron therapy. - RTC 3 months with repeat CBC, ferritin, iron panel.  Will also check MMA as her B12 level is borderline at 238.  Will check copper level as well.   Orders Placed This Encounter  Procedures   CBC    Standing Status:   Future    Standing Expiration Date:   10/26/2023   Iron and TIBC ([facility] ONLY)    Standing Status:   Future    Standing Expiration Date:   10/26/2023   Ferritin    Standing Status:   Future     Standing Expiration Date:   10/26/2023   Vitamin B12    Standing Status:   Future    Standing Expiration Date:   10/26/2023   Methylmalonic acid, serum    Standing Status:   Future    Standing Expiration Date:   10/26/2023   Copper, serum    Standing Status:   Future    Standing Expiration Date:   10/26/2023      Alben Deeds Teague,acting as a scribe for Doreatha Massed, MD.,have documented all relevant documentation on the behalf of Doreatha Massed, MD,as directed by  Doreatha Massed, MD while in the presence of Doreatha Massed, MD.   I, Doreatha Massed MD, have reviewed the above documentation for accuracy and completeness, and I agree with the above.   Doreatha Massed, MD   10/22/20246:16 PM  CHIEF COMPLAINT/PURPOSE OF CONSULT:   Diagnosis: Iron deficiency state  Current Therapy: Feraheme  HISTORY OF PRESENT ILLNESS:   Deon is a 67 y.o. female presenting to clinic today for evaluation of low ferritin at the request of Heber Sherwood, LPN.  Today, she states that she is doing well overall. Her appetite level is at 75%. Her energy level is at 25%.  She was found to have normal CBC from 10/12/22. She also had low ferritin at 4, elevated TIBC at 514, and low saturation ratios at 7 from 10/12/22. This is the first time she has been told she has low ferritin. She has had low energy levels for the  last 4.5 months and has never taken Vitamin B 12 supplements. She has a history of GERD, IBS, and gastritis. She denies any BRBPR, melena, hematuria, or ice pica. She does not take iron supplements and has never had a blood transfusion. She is unable to tolerate sulfur.  She has lightheadedness when standing up.  She has had IBS since 2012, though it has recently worsened with intensifying constipation and diarrhea.   Her last colonoscopy and endoscopy was in 2022.   PAST MEDICAL HISTORY:   Past Medical History: Past Medical History:  Diagnosis Date    Allergy    Anal fissure    Anemia, iron deficiency 10/26/2022   Cataract    Chronic constipation    Coronary artery disease    Diverticulosis 04/20/2010   colonoscopy   Gastroparesis    GERD (gastroesophageal reflux disease)    Hemorrhoids    Hemorrhoids    Hyperlipidemia    IBS (irritable bowel syndrome)    MVP (mitral valve prolapse)    as a teenager per patient, no issues since   Osteoporosis    Polyp, stomach 04/20/2010   egd   Shingles    UTI (lower urinary tract infection)     Surgical History: Past Surgical History:  Procedure Laterality Date   CHOLECYSTECTOMY N/A 09/21/2021   Procedure: LAPAROSCOPIC CHOLECYSTECTOMY;  Surgeon: Fritzi Mandes, MD;  Location: [facility] OR;  Service: General;  Laterality: N/A;   COLONOSCOPY     CORONARY PRESSURE/FFR STUDY N/A 07/22/2021   Procedure: INTRAVASCULAR PRESSURE WIRE/FFR STUDY;  Surgeon: Lyn Records, MD;  Location: [facility] INVASIVE CV LAB;  Service: Cardiovascular;  Laterality: N/A;   ESOPHAGOGASTRODUODENOSCOPY     LEFT HEART CATH AND CORONARY ANGIOGRAPHY N/A 07/22/2021   Procedure: LEFT HEART CATH AND CORONARY ANGIOGRAPHY;  Surgeon: Lyn Records, MD;  Location: [facility] INVASIVE CV LAB;  Service: Cardiovascular;  Laterality: N/A;    Social History: Social History   Socioeconomic History   Marital status: Married    Spouse name: Not on file   Number of children: 0   Years of education: Not on file   Highest education level: Not on file  Occupational History   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    Employer: [employer]  Tobacco Use   Smoking status: Some Days    Current packs/day: 0.00    Average packs/day: 1 pack/day for 20.0 years (20.0 ttl pk-yrs)    Types: Cigarettes    Start date: 05/2000    Last attempt to quit: 05/2020    Years since quitting: 2.4   Smokeless tobacco: Never   Tobacco comments:    USE SMOKELESS CIGARETTES  Vaping Use   Vaping status: Some Days   Substances:  Nicotine  Substance and Sexual Activity   Alcohol use: Not Currently    Comment: 1 a week, wine   Drug use: No   Sexual activity: Not Currently  Other Topics Concern   Not on file  Social History Narrative   Not on file   Social Determinants of Health   Financial Resource Strain: Not on file  Food Insecurity: Not on file  Transportation Needs: Not on file  Physical Activity: Not on file  Stress: Not on file  Social Connections: Not on file  Intimate Partner Violence: Not on file    Family History: Family History  Problem Relation Age of Onset   Hypertension Mother    Diabetes Father    Heart  attack Father        Age 68   Colon cancer Neg Hx    Esophageal cancer Neg Hx    Rectal cancer Neg Hx     Current Medications:  Current Outpatient Medications:    alfuzosin (UROXATRAL) 10 MG 24 hr tablet, Take 1 tablet (10 mg total) by mouth daily with breakfast., Disp: 30 tablet, Rfl: 11   aspirin EC 81 MG tablet, Take 1 tablet (81 mg total) by mouth daily. Swallow whole., Disp: 90 tablet, Rfl: 3   Cholecalciferol (VITAMIN D3 PO), Take 1 tablet by mouth daily., Disp: , Rfl:    docusate sodium (COLACE) 100 MG capsule, Take 1 capsule (100 mg total) by mouth 2 (two) times daily., Disp: 20 capsule, Rfl: 0   erythromycin ophthalmic ointment, Place 1 Application into the right eye at bedtime., Disp: , Rfl:    ezetimibe (ZETIA) 10 MG tablet, Take 1 tablet (10 mg total) by mouth daily., Disp: 90 tablet, Rfl: 3   famotidine (PEPCID) 20 MG tablet, One after supper, Disp: , Rfl:    fluticasone (FLONASE) 50 MCG/ACT nasal spray, Place 1 spray into both nostrils daily as needed for allergies or rhinitis., Disp: , Rfl:    hydrocortisone (ANUSOL-HC) 25 MG suppository, Place 1 suppository (25 mg total) rectally at bedtime as needed for hemorrhoids or anal itching. For 5 days, Disp: 5 suppository, Rfl: 0   hyoscyamine (LEVSIN SL) 0.125 MG SL tablet, Place 1 tablet (0.125 mg total) under the tongue  every 6 (six) hours as needed., Disp: 40 tablet, Rfl: 0   levocetirizine (XYZAL) 5 MG tablet, Take 1 tablet (5 mg total) by mouth every evening. For allergies, Disp: 90 tablet, Rfl: 3   Lido-PE-Mineral Oil-Petrolatum (RECTICARE ADVANCED) 5-0.25-17-39 % CREA, Use three times daily as needed, Disp: , Rfl:    Lidocaine, Anorectal, (RECTICARE) 5 % CREA, Apply 1 Application topically as needed., Disp: 28 g, Rfl: 0   LINZESS 145 MCG CAPS capsule, TAKE 1 CAPSULE BY MOUTH EVERY MORNING BEFORE BREAKFAST, Disp: 90 capsule, Rfl: 1   nitroGLYCERIN (NITROSTAT) 0.4 MG SL tablet, Place 1 tablet (0.4 mg total) under the tongue every 5 (five) minutes as needed for chest pain., Disp: 25 tablet, Rfl: 3   ofloxacin (OCUFLOX) 0.3 % ophthalmic solution, Place 1 drop into the right eye See admin instructions. Instill 1 drop into the right eye every hour, Disp: , Rfl:    pantoprazole (PROTONIX) 40 MG tablet, TAKE 1 TABLET BY MOUTH EVERY MORNING BEFORE BREAKFAST, Disp: 90 tablet, Rfl: 2   pravastatin (PRAVACHOL) 10 MG tablet, Take 1 tablet (10 mg total) by mouth 3 (three) times a week. Take on Mondays, Wednesdays, and Fridays., Disp: 35 tablet, Rfl: 3   psyllium (METAMUCIL) 58.6 % packet, Take 1 packet by mouth every evening., Disp: , Rfl:    senna-docusate (SENOKOT S) 8.6-50 MG tablet, Take 1-2 tablets by mouth at bedtime., Disp: , Rfl:    Simethicone (PHAZYME MAXIMUM STRENGTH) 250 MG CAPS, Take 250 mg by mouth 3 (three) times daily. (Patient taking differently: Take 250 mg by mouth 3 (three) times daily as needed (gas relief).), Disp: 14 capsule, Rfl:    sucralfate (CARAFATE) 1 GM/10ML suspension, Take 10 mLs (1 g total) by mouth 3 (three) times daily., Disp: 1200 mL, Rfl: 0   valACYclovir (VALTREX) 1000 MG tablet, TAKE 1 TABLET (1,000 MG TOTAL) BY MOUTH AT BEDTIME., Disp: 90 tablet, Rfl: 0   Allergies: Allergies  Allergen Reactions   Chlordiazepoxide-Clidinium Itching    (  Librax)   Ciprofloxacin Itching    Irritates  stomach   Flagyl [Metronidazole]     Severe yeast infection   Sulfa Drugs Cross Reactors Nausea Only   Nitrofurantoin Monohyd Macro Rash   Omnicef [Cefdinir] Nausea And Vomiting    REVIEW OF SYSTEMS:   Review of Systems  Constitutional:  Negative for chills, fatigue and fever.  HENT:   Negative for lump/mass, mouth sores, nosebleeds, sore throat and trouble swallowing.   Eyes:  Negative for eye problems.  Respiratory:  Negative for cough and shortness of breath.   Cardiovascular:  Positive for chest pain. Negative for leg swelling and palpitations.  Gastrointestinal:  Positive for abdominal pain (6/10 severity), constipation, diarrhea and nausea. Negative for vomiting.  Genitourinary:  Negative for bladder incontinence, difficulty urinating, dysuria, frequency, hematuria and nocturia.   Musculoskeletal:  Negative for arthralgias, back pain, flank pain, myalgias and neck pain.  Skin:  Negative for itching and rash.  Neurological:  Positive for dizziness and light-headedness. Negative for headaches and numbness.  Hematological:  Does not bruise/bleed easily.  Psychiatric/Behavioral:  Negative for depression, sleep disturbance and suicidal ideas. The patient is nervous/anxious.   All other systems reviewed and are negative.    VITALS:   Blood pressure 129/76, pulse 88, temperature 98.1 F (36.7 C), temperature source Oral, resp. rate 17, height 4\' 11"  (1.499 m), weight 104 lb 11.2 oz (47.5 kg), SpO2 100%.  Wt Readings from Last 3 Encounters:  10/26/22 104 lb 11.2 oz (47.5 kg)  09/02/22 105 lb (47.6 kg)  08/27/22 103 lb (46.7 kg)    Body mass index is 21.15 kg/m.   PHYSICAL EXAM:   Physical Exam Vitals and nursing note reviewed. Exam conducted with a chaperone present.  Constitutional:      Appearance: Normal appearance.  Cardiovascular:     Rate and Rhythm: Normal rate and regular rhythm.     Pulses: Normal pulses.     Heart sounds: Normal heart sounds.  Pulmonary:      Effort: Pulmonary effort is normal.     Breath sounds: Normal breath sounds.  Abdominal:     Palpations: Abdomen is soft. There is no hepatomegaly, splenomegaly or mass.     Tenderness: There is no abdominal tenderness.  Musculoskeletal:     Right lower leg: No edema.     Left lower leg: No edema.  Lymphadenopathy:     Cervical: No cervical adenopathy.     Right cervical: No superficial, deep or posterior cervical adenopathy.    Left cervical: No superficial, deep or posterior cervical adenopathy.     Upper Body:     Right upper body: No supraclavicular or axillary adenopathy.     Left upper body: No supraclavicular or axillary adenopathy.  Neurological:     General: No focal deficit present.     Mental Status: She is alert and oriented to person, place, and time.  Psychiatric:        Mood and Affect: Mood normal.        Behavior: Behavior normal.     LABS:      Latest Ref Rng & Units 10/12/2022    2:19 PM 08/27/2022    3:18 PM 06/14/2022    1:39 PM  CBC  WBC 4.0 - 10.5 K/uL 4.3  4.2  4.2   Hemoglobin 12.0 - 15.0 g/dL 62.1  30.8  65.7   Hematocrit 36.0 - 46.0 % 39.1  35.8  37.0   Platelets 150 - 400 K/uL 208  207.0  211       Latest Ref Rng & Units 08/27/2022    3:18 PM 06/04/2022    4:51 PM 12/09/2021    2:37 PM  CMP  Glucose 70 - 99 mg/dL 95  500  87   BUN 6 - 23 mg/dL 11  9  5    Creatinine 0.40 - 1.20 mg/dL 9.38  1.82  9.93   Sodium 135 - 145 mEq/L 142  137  141   Potassium 3.5 - 5.1 mEq/L 3.5  3.5  3.5   Chloride 96 - 112 mEq/L 105  103  105   CO2 19 - 32 mEq/L 28  25  29    Calcium 8.4 - 10.5 mg/dL 9.1  8.9  9.0   Total Protein 6.0 - 8.3 g/dL 6.5  6.5  6.8   Total Bilirubin 0.2 - 1.2 mg/dL 0.2  0.4  0.2   Alkaline Phos 39 - 117 U/L 75  69  73   AST 0 - 37 U/L 10  14  12    ALT 0 - 35 U/L 9  11  8       No results found for: "CEA1", "CEA" / No results found for: "CEA1", "CEA" No results found for: "PSA1" No results found for: "ZJI967" No results found for:  "CAN125"  No results found for: "TOTALPROTELP", "ALBUMINELP", "A1GS", "A2GS", "BETS", "BETA2SER", "GAMS", "MSPIKE", "SPEI" Lab Results  Component Value Date   TIBC 514 (H) 10/12/2022   FERRITIN 4 (L) 10/12/2022   FERRITIN 38.9 12/08/2010   FERRITIN 36.4 04/03/2010   IRONPCTSAT 7 (L) 10/12/2022   IRONPCTSAT 28.4 12/08/2010   IRONPCTSAT 20.9 04/03/2010   No results found for: "LDH"   STUDIES:   CT CHEST NODULE FOLLOW UP LOW DOSE W/O  Result Date: 10/25/2022 CLINICAL DATA:  Lung nodule.  Former smoker, 20 pack-year history. EXAM: CT CHEST WITHOUT CONTRAST TECHNIQUE: Multidetector CT imaging of the chest was performed following the standard protocol without IV contrast. RADIATION DOSE REDUCTION: This exam was performed according to the departmental dose-optimization program which includes automated exposure control, adjustment of the mA and/or kV according to patient size and/or use of iterative reconstruction technique. COMPARISON:  07/01/2021. FINDINGS: Cardiovascular: Atherosclerotic calcification of the aorta, aortic valve and coronary arteries. Heart is at the upper limits of normal in size to mildly enlarged. No pericardial effusion. Mediastinum/Nodes: No pathologically enlarged mediastinal or axillary lymph nodes. Hilar regions are difficult to definitively evaluate without IV contrast. Esophagus is grossly unremarkable. Lungs/Pleura: Biapical pleuroparenchymal scarring. Centrilobular emphysema. Smoking related respiratory bronchiolitis. Pulmonary nodules measure 6.7 mm or less in size, as before. No new pulmonary nodules. No pleural fluid. Minimal adherent debris in the airway. Upper Abdomen: Visualized portions of the liver, adrenal glands, kidneys, spleen, pancreas, stomach and bowel are grossly unremarkable. No upper abdominal adenopathy. Cholecystectomy. Musculoskeletal: None. IMPRESSION: 1. Lung-RADS 2, benign appearance or behavior. Continue annual screening with low-dose chest CT  without contrast in 12 months. 2. Aortic atherosclerosis (ICD10-I70.0). Coronary artery calcification. 3.  Emphysema (ICD10-J43.9). Electronically Signed   By: Leanna Battles M.D.   On: 10/25/2022 14:50   MR Lumbar Spine Wo Contrast  Result Date: 10/25/2022 CLINICAL DATA:  Low back pain. Symptoms persist with greater than 6 weeks of treatment. Bilateral lower extremity symptoms. EXAM: MRI LUMBAR SPINE WITHOUT CONTRAST TECHNIQUE: Multiplanar, multisequence MR imaging of the lumbar spine was performed. No intravenous contrast was administered. COMPARISON:  MRI 12/27/2019 FINDINGS: Segmentation:  5 lumbar type vertebral bodies. Alignment: Minimal scoliotic  curvature convex to the left. No listhesis. Vertebrae: No fracture or focal bone lesion. No edematous endplate changes or edematous facet arthritis. Conus medullaris and cauda equina: Conus extends to the L1-2 level. Conus and cauda equina appear normal. Paraspinal and other soft tissues: Negative Disc levels: Normal from T11-12 through L4-5. No disc degeneration or facet degeneration. No stenosis of the canal or foramina. L5-S1: Minimal annular fissure in the right posterolateral position. No encroachment upon the canal or foramina. Mild facet osteoarthritis. Findings at this level could relate to back pain. No neural compression is identified. Similar appearance to the study of 2021. IMPRESSION: 1. No change since 2021. 2. L5-S1: Small annular fissure in the right posterolateral position. No encroachment upon the canal or foramina. Mild facet osteoarthritis. Findings at this level could relate to back pain. Electronically Signed   By: Paulina Fusi M.D.   On: 10/25/2022 10:33

## 2022-10-26 ENCOUNTER — Inpatient Hospital Stay: Payer: Medicare Other | Attending: Hematology | Admitting: Hematology

## 2022-10-26 ENCOUNTER — Inpatient Hospital Stay: Payer: Medicare Other

## 2022-10-26 VITALS — BP 129/76 | HR 88 | Temp 98.1°F | Resp 17 | Ht 59.0 in | Wt 104.7 lb

## 2022-10-26 DIAGNOSIS — Z8719 Personal history of other diseases of the digestive system: Secondary | ICD-10-CM | POA: Insufficient documentation

## 2022-10-26 DIAGNOSIS — K219 Gastro-esophageal reflux disease without esophagitis: Secondary | ICD-10-CM | POA: Diagnosis not present

## 2022-10-26 DIAGNOSIS — D508 Other iron deficiency anemias: Secondary | ICD-10-CM | POA: Insufficient documentation

## 2022-10-26 DIAGNOSIS — K589 Irritable bowel syndrome without diarrhea: Secondary | ICD-10-CM | POA: Insufficient documentation

## 2022-10-26 DIAGNOSIS — D509 Iron deficiency anemia, unspecified: Secondary | ICD-10-CM | POA: Insufficient documentation

## 2022-10-26 DIAGNOSIS — E611 Iron deficiency: Secondary | ICD-10-CM | POA: Diagnosis not present

## 2022-10-26 HISTORY — DX: Iron deficiency anemia, unspecified: D50.9

## 2022-10-26 NOTE — Patient Instructions (Addendum)
You were seen and examined today by Dr. Ellin Saba. Dr. Ellin Saba is a hematologist, meaning that he specializes in blood abnormalities. Dr. Ellin Saba discussed your past medical history, family history of cancers/blood conditions and the events that led to you being here today.  You were referred to Dr. Ellin Saba due to low ferritin (iron).  We will arrange for you to have iron infusions to bring your levels up, and hopefully improve your energy.   Follow-up as scheduled.

## 2022-10-28 ENCOUNTER — Ambulatory Visit: Payer: Medicare Other | Admitting: Orthopaedic Surgery

## 2022-10-28 ENCOUNTER — Encounter: Payer: Self-pay | Admitting: Orthopaedic Surgery

## 2022-10-28 VITALS — Ht 59.0 in | Wt 107.0 lb

## 2022-10-28 DIAGNOSIS — M51369 Other intervertebral disc degeneration, lumbar region without mention of lumbar back pain or lower extremity pain: Secondary | ICD-10-CM | POA: Diagnosis not present

## 2022-10-28 NOTE — Addendum Note (Signed)
Addended by: Rogers Seeds on: 10/28/2022 11:10 AM   Modules accepted: Orders

## 2022-10-28 NOTE — Progress Notes (Signed)
Office Visit Note   Patient: Ruth Terry           Date of Birth: 1955/12/09           MRN: [id number] Visit Date: 10/28/2022              Requested by: Rebekah Chesterfield, NP [id number] [place],  [place] [address] PCP: Rebekah Chesterfield, NP   Assessment & Plan: Visit Diagnoses:  1. Annular tear of lumbar disc     Plan: MRI scan reviewed pathophysiology discussed.  She has a large lumbar canal.  Will set her up for some physical therapy if this not effective we could consider single epidural injections for the annular tear L5-S1.  Follow-Up Instructions: No follow-ups on file.   Orders:  No orders of the defined types were placed in this encounter.  No orders of the defined types were placed in this encounter.     Procedures: No procedures performed   Clinical Data: No additional findings.   Subjective: Chief Complaint  Patient presents with   Lower Back - Pain, Follow-up    MRI reivew    HPI 67 year old female returns with some ongoing back pain mostly on the right buttocks sometimes down toward the midline.  He states she is also noticed some problems with sometimes diarrhea and constipation.  She is getting ready to get some iron infusions for anemia.  She also has second cataract surgery scheduled in November.  MRI scan has been obtained by Dr. Hilda Lias and is available for review.  MRI shows annular tear L5-S1 without nerve encroachment.  Patient symptoms have been intermittent, episodic, variable intensity.  Aggravated by bending turning twisting repetitively.  Review of Systems all systems updated unchanged.   Objective: Vital Signs: Ht 4\' 11"  (1.499 m)   Wt 107 lb (48.5 kg)   BMI 21.61 kg/m   Physical Exam Constitutional:      Appearance: She is well-developed.  HENT:     Head: Normocephalic.     Right Ear: External ear normal.     Left Ear: External ear normal. There is no impacted cerumen.  Eyes:     Pupils: Pupils are equal, round, and  reactive to light.  Neck:     Thyroid: No thyromegaly.     Trachea: No tracheal deviation.  Cardiovascular:     Rate and Rhythm: Normal rate.  Pulmonary:     Effort: Pulmonary effort is normal.  Abdominal:     Palpations: Abdomen is soft.  Musculoskeletal:     Cervical back: No rigidity.  Skin:    General: Skin is warm and dry.  Neurological:     Mental Status: She is alert and oriented to person, place, and time.  Psychiatric:        Behavior: Behavior normal.     Ortho Exam negative straight leg raising myocyte notch tenderness on the right no trochanteric bursal tenderness no left sciatic notch tenderness.  Quads hamstrings dorsiflexion plantarflexion is normal normal heel-toe gait.  Specialty Comments:  No specialty comments available.  Imaging: Narrative & Impression  CLINICAL DATA:  Low back pain. Symptoms persist with greater than 6 weeks of treatment. Bilateral lower extremity symptoms.   EXAM: MRI LUMBAR SPINE WITHOUT CONTRAST   TECHNIQUE: Multiplanar, multisequence MR imaging of the lumbar spine was performed. No intravenous contrast was administered.   COMPARISON:  MRI 12/27/2019   FINDINGS: Segmentation:  5 lumbar type vertebral bodies.   Alignment: Minimal  scoliotic curvature convex to the left. No listhesis.   Vertebrae: No fracture or focal bone lesion. No edematous endplate changes or edematous facet arthritis.   Conus medullaris and cauda equina: Conus extends to the L1-2 level. Conus and cauda equina appear normal.   Paraspinal and other soft tissues: Negative   Disc levels:   Normal from T11-12 through L4-5. No disc degeneration or facet degeneration. No stenosis of the canal or foramina.   L5-S1: Minimal annular fissure in the right posterolateral position. No encroachment upon the canal or foramina. Mild facet osteoarthritis. Findings at this level could relate to back pain. No neural compression is identified. Similar appearance to  the study of 2021.   IMPRESSION: 1. No change since 2021. 2. L5-S1: Small annular fissure in the right posterolateral position. No encroachment upon the canal or foramina. Mild facet osteoarthritis. Findings at this level could relate to back pain.     Electronically Signed   By: Paulina Fusi M.D.   On: 10/25/2022 10:33     PMFS History: Patient Active Problem List   Diagnosis Date Noted   Annular tear of lumbar disc 10/28/2022   Anemia, iron deficiency 10/26/2022   CAD (coronary artery disease) 07/22/2021   COPD GOLD ? / group B 05/12/2021   Exertional chest pain 05/12/2021   Former smoker 05/12/2021   Generalized abdominal pain 01/02/2021   History of shingles 04/24/2019   Chronic midline low back pain without sciatica 02/27/2019   Chronic idiopathic constipation 04/26/2018   Aortic atherosclerosis (HCC) 03/01/2017   Rib fracture 12/07/2016   Gas pain 03/09/2016   Anal itching 03/09/2016   Elevated LDL cholesterol level 10/24/2015   Internal hemorrhoids 05/14/2014   GERD (gastroesophageal reflux disease) 09/17/2011   IBS (irritable bowel syndrome) 08/25/2010   Gastroparesis 06/30/2010   Non-ulcer dyspepsia 04/03/2010   Past Medical History:  Diagnosis Date   Allergy    Anal fissure    Anemia, iron deficiency 10/26/2022   Cataract    Chronic constipation    Coronary artery disease    Diverticulosis 04/20/2010   colonoscopy   Gastroparesis    GERD (gastroesophageal reflux disease)    Hemorrhoids    Hemorrhoids    Hyperlipidemia    IBS (irritable bowel syndrome)    MVP (mitral valve prolapse)    as a teenager per patient, no issues since   Osteoporosis    Polyp, stomach 04/20/2010   egd   Shingles    UTI (lower urinary tract infection)     Family History  Problem Relation Age of Onset   Hypertension Mother    Diabetes Father    Heart attack Father        Age 9   Colon cancer Neg Hx    Esophageal cancer Neg Hx    Rectal cancer Neg Hx     Past  Surgical History:  Procedure Laterality Date   CHOLECYSTECTOMY N/A 09/21/2021   Procedure: LAPAROSCOPIC CHOLECYSTECTOMY;  Surgeon: Fritzi Mandes, MD;  Location: [facility] OR;  Service: General;  Laterality: N/A;   COLONOSCOPY     CORONARY PRESSURE/FFR STUDY N/A 07/22/2021   Procedure: INTRAVASCULAR PRESSURE WIRE/FFR STUDY;  Surgeon: Lyn Records, MD;  Location: [facility] INVASIVE CV LAB;  Service: Cardiovascular;  Laterality: N/A;   ESOPHAGOGASTRODUODENOSCOPY     LEFT HEART CATH AND CORONARY ANGIOGRAPHY N/A 07/22/2021   Procedure: LEFT HEART CATH AND CORONARY ANGIOGRAPHY;  Surgeon: Lyn Records, MD;  Location: [facility] INVASIVE CV LAB;  Service: Cardiovascular;  Laterality: N/A;   Social History   Occupational History   Occupation: Interior and spatial designer    Comment: Dentist: [employer]  Tobacco Use   Smoking status: Some Days    Current packs/day: 0.00    Average packs/day: 1 pack/day for 20.0 years (20.0 ttl pk-yrs)    Types: Cigarettes    Start date: 05/2000    Last attempt to quit: 05/2020    Years since quitting: 2.4   Smokeless tobacco: Never   Tobacco comments:    USE SMOKELESS CIGARETTES  Vaping Use   Vaping status: Some Days   Substances: Nicotine  Substance and Sexual Activity   Alcohol use: Not Currently    Comment: 1 a week, wine   Drug use: No   Sexual activity: Not Currently

## 2022-10-29 ENCOUNTER — Inpatient Hospital Stay: Payer: Medicare Other

## 2022-10-29 VITALS — BP 122/63 | HR 75 | Temp 97.0°F | Resp 18

## 2022-10-29 DIAGNOSIS — K589 Irritable bowel syndrome without diarrhea: Secondary | ICD-10-CM | POA: Diagnosis not present

## 2022-10-29 DIAGNOSIS — D509 Iron deficiency anemia, unspecified: Secondary | ICD-10-CM

## 2022-10-29 DIAGNOSIS — D508 Other iron deficiency anemias: Secondary | ICD-10-CM | POA: Diagnosis not present

## 2022-10-29 DIAGNOSIS — K219 Gastro-esophageal reflux disease without esophagitis: Secondary | ICD-10-CM | POA: Diagnosis not present

## 2022-10-29 DIAGNOSIS — E611 Iron deficiency: Secondary | ICD-10-CM | POA: Diagnosis not present

## 2022-10-29 DIAGNOSIS — Z8719 Personal history of other diseases of the digestive system: Secondary | ICD-10-CM | POA: Diagnosis not present

## 2022-10-29 MED ORDER — SODIUM CHLORIDE 0.9 % IV SOLN
510.0000 mg | Freq: Once | INTRAVENOUS | Status: AC
  Administered 2022-10-29: 510 mg via INTRAVENOUS
  Filled 2022-10-29: qty 17

## 2022-10-29 MED ORDER — ACETAMINOPHEN 325 MG PO TABS
650.0000 mg | ORAL_TABLET | Freq: Once | ORAL | Status: AC
  Administered 2022-10-29: 650 mg via ORAL
  Filled 2022-10-29: qty 2

## 2022-10-29 MED ORDER — CETIRIZINE HCL 10 MG/ML IV SOLN
10.0000 mg | Freq: Once | INTRAVENOUS | Status: AC
  Administered 2022-10-29: 10 mg via INTRAVENOUS
  Filled 2022-10-29: qty 1

## 2022-10-29 MED ORDER — METHYLPREDNISOLONE SODIUM SUCC 125 MG IJ SOLR
125.0000 mg | Freq: Once | INTRAMUSCULAR | Status: AC
  Administered 2022-10-29: 125 mg via INTRAVENOUS
  Filled 2022-10-29: qty 2

## 2022-10-29 MED ORDER — OCTREOTIDE ACETATE 30 MG IM KIT
PACK | INTRAMUSCULAR | Status: AC
  Filled 2022-10-29: qty 1

## 2022-10-29 MED ORDER — SODIUM CHLORIDE 0.9 % IV SOLN
INTRAVENOUS | Status: DC
Start: 2022-10-29 — End: 2022-10-29

## 2022-10-29 MED ORDER — FAMOTIDINE IN NACL 20-0.9 MG/50ML-% IV SOLN
20.0000 mg | Freq: Once | INTRAVENOUS | Status: AC
  Administered 2022-10-29: 20 mg via INTRAVENOUS
  Filled 2022-10-29: qty 50

## 2022-10-29 NOTE — Progress Notes (Signed)
Patient presents today for Feraheme infusion per providers order.  Vital signs WNL.  Patient has no new complaints at this time.  Peripheral IV started and blood return noted pre and post infusion.    Stable during infusion without adverse affects.  Vital signs stable.  No complaints at this time.  Discharge from clinic ambulatory in stable condition.  Alert and oriented X 3.  Follow up with [facility] as scheduled.  

## 2022-10-29 NOTE — Patient Instructions (Signed)
[facility]  Discharge Instructions: Thank you for choosing [facility] to provide your oncology and hematology care.  If you have a lab appointment with the Cancer Center - please note that after April 8th, 2024, all labs will be drawn in the cancer center.  You do not have to check in or register with the main entrance as you have in the past but will complete your check-in in the cancer center.  Wear comfortable clothing and clothing appropriate for easy access to any Portacath or PICC line.   We strive to give you quality time with your provider. You may need to reschedule your appointment if you arrive late (15 or more minutes).  Arriving late affects you and other patients whose appointments are after yours.  Also, if you miss three or more appointments without notifying the office, you may be dismissed from the clinic at the provider's discretion.      For prescription refill requests, have your pharmacy contact our office and allow 72 hours for refills to be completed.    Today you received the following chemotherapy and/or immunotherapy agents Feraheme      To help prevent nausea and vomiting after your treatment, we encourage you to take your nausea medication as directed.  BELOW ARE SYMPTOMS THAT SHOULD BE REPORTED IMMEDIATELY: *FEVER GREATER THAN 100.4 F (38 C) OR HIGHER *CHILLS OR SWEATING *NAUSEA AND VOMITING THAT IS NOT CONTROLLED WITH YOUR NAUSEA MEDICATION *UNUSUAL SHORTNESS OF BREATH *UNUSUAL BRUISING OR BLEEDING *URINARY PROBLEMS (pain or burning when urinating, or frequent urination) *BOWEL PROBLEMS (unusual diarrhea, constipation, pain near the anus) TENDERNESS IN MOUTH AND THROAT WITH OR WITHOUT PRESENCE OF ULCERS (sore throat, sores in mouth, or a toothache) UNUSUAL RASH, SWELLING OR PAIN  UNUSUAL VAGINAL DISCHARGE OR ITCHING   Items with * indicate a potential emergency and should be followed up as soon as possible or go to the  Emergency Department if any problems should occur.  Please show the CHEMOTHERAPY ALERT CARD or IMMUNOTHERAPY ALERT CARD at check-in to the Emergency Department and triage nurse.  Should you have questions after your visit or need to cancel or reschedule your appointment, please contact [facility] [phone]  and follow the prompts.  Office hours are 8:00 a.m. to 4:30 p.m. Monday - Friday. Please note that voicemails left after 4:00 p.m. may not be returned until the following business day.  We are closed weekends and major holidays. You have access to a nurse at all times for urgent questions. Please call the main number to the clinic [phone] and follow the prompts.  For any non-urgent questions, you may also contact your provider using MyChart. We now offer e-Visits for anyone 18 and older to request care online for non-urgent symptoms. For details visit [email or web address].   Also download the MyChart app! Go to the app store, search "MyChart", open the app, select [facility], and log in with your MyChart username and password.   

## 2022-11-01 ENCOUNTER — Telehealth: Payer: Self-pay | Admitting: *Deleted

## 2022-11-01 DIAGNOSIS — N12 Tubulo-interstitial nephritis, not specified as acute or chronic: Secondary | ICD-10-CM | POA: Diagnosis not present

## 2022-11-01 DIAGNOSIS — N3001 Acute cystitis with hematuria: Secondary | ICD-10-CM | POA: Diagnosis not present

## 2022-11-01 DIAGNOSIS — R300 Dysuria: Secondary | ICD-10-CM | POA: Diagnosis not present

## 2022-11-01 DIAGNOSIS — R109 Unspecified abdominal pain: Secondary | ICD-10-CM | POA: Diagnosis not present

## 2022-11-01 NOTE — Telephone Encounter (Signed)
Patient called to advise that she has felt weak and unwell in general since iron infusion last week.In addition, states that she feels like she has a UTI, which I explained is likely the cause of there symptoms.  She is having symptoms of dysuria and burning.  Advised that she see her PCP or Urgent care for evaluation.  Verbalized understanding.

## 2022-11-09 ENCOUNTER — Inpatient Hospital Stay: Payer: Medicare Other

## 2022-11-12 DIAGNOSIS — M5459 Other low back pain: Secondary | ICD-10-CM | POA: Diagnosis not present

## 2022-11-13 ENCOUNTER — Other Ambulatory Visit: Payer: Self-pay | Admitting: Physician Assistant

## 2022-11-16 ENCOUNTER — Inpatient Hospital Stay: Payer: Medicare Other | Attending: Hematology

## 2022-11-16 VITALS — BP 108/64 | HR 75 | Temp 99.0°F | Resp 18

## 2022-11-16 DIAGNOSIS — E611 Iron deficiency: Secondary | ICD-10-CM | POA: Insufficient documentation

## 2022-11-16 DIAGNOSIS — Z8639 Personal history of other endocrine, nutritional and metabolic disease: Secondary | ICD-10-CM | POA: Diagnosis not present

## 2022-11-16 DIAGNOSIS — D509 Iron deficiency anemia, unspecified: Secondary | ICD-10-CM

## 2022-11-16 MED ORDER — ACETAMINOPHEN 325 MG PO TABS
650.0000 mg | ORAL_TABLET | Freq: Once | ORAL | Status: AC
  Administered 2022-11-16: 650 mg via ORAL
  Filled 2022-11-16: qty 2

## 2022-11-16 MED ORDER — FAMOTIDINE IN NACL 20-0.9 MG/50ML-% IV SOLN
20.0000 mg | Freq: Once | INTRAVENOUS | Status: AC
  Administered 2022-11-16: 20 mg via INTRAVENOUS
  Filled 2022-11-16: qty 50

## 2022-11-16 MED ORDER — SODIUM CHLORIDE 0.9 % IV SOLN
510.0000 mg | Freq: Once | INTRAVENOUS | Status: AC
  Administered 2022-11-16: 510 mg via INTRAVENOUS
  Filled 2022-11-16: qty 510

## 2022-11-16 MED ORDER — CETIRIZINE HCL 10 MG/ML IV SOLN
10.0000 mg | Freq: Once | INTRAVENOUS | Status: AC
  Administered 2022-11-16: 10 mg via INTRAVENOUS
  Filled 2022-11-16: qty 1

## 2022-11-16 MED ORDER — METHYLPREDNISOLONE SODIUM SUCC 125 MG IJ SOLR
125.0000 mg | Freq: Once | INTRAMUSCULAR | Status: AC
  Administered 2022-11-16: 125 mg via INTRAVENOUS
  Filled 2022-11-16: qty 2

## 2022-11-16 MED ORDER — SODIUM CHLORIDE 0.9 % IV SOLN
INTRAVENOUS | Status: DC
Start: 2022-11-16 — End: 2022-11-16

## 2022-11-16 NOTE — Progress Notes (Signed)
Patient presents today for Feraheme infusion. Vital signs stable. Patient denies any side effects related to her last Feraheme infusion. MAR reviewed and updated.   Feraheme given today per MD orders. Tolerated infusion without adverse affects. Vital signs stable. No complaints at this time. Discharged from clinic ambulatory in stable condition. Alert and oriented x 3. F/U with [facility] as scheduled.

## 2022-11-16 NOTE — Patient Instructions (Signed)
 [facility] - A DEPT OF [facility]  Discharge Instructions: Thank you for choosing [facility] to provide your oncology and hematology care.  If you have a lab appointment with the Cancer Center - please note that after April 8th, 2024, all labs will be drawn in the cancer center.  You do not have to check in or register with the main entrance as you have in the past but will complete your check-in in the cancer center.  Wear comfortable clothing and clothing appropriate for easy access to any Portacath or PICC line.   We strive to give you quality time with your provider. You may need to reschedule your appointment if you arrive late (15 or more minutes).  Arriving late affects you and other patients whose appointments are after yours.  Also, if you miss three or more appointments without notifying the office, you may be dismissed from the clinic at the provider's discretion.      For prescription refill requests, have your pharmacy contact our office and allow 72 hours for refills to be completed.    Today you received the following chemotherapy and/or immunotherapy agents Feraheme. Ferumoxytol Injection What is this medication? FERUMOXYTOL (FER ue MOX i tol) treats low levels of iron in your body (iron deficiency anemia). Iron is a mineral that plays an important role in making red blood cells, which carry oxygen from your lungs to the rest of your body. This medicine may be used for other purposes; ask your health care provider or pharmacist if you have questions. COMMON BRAND NAME(S): Feraheme What should I tell my care team before I take this medication? They need to know if you have any of these conditions: Anemia not caused by low iron levels High levels of iron in the blood Magnetic resonance imaging (MRI) test scheduled An unusual or allergic reaction to iron, other medications, foods, dyes, or preservatives Pregnant or trying to get  pregnant Breastfeeding How should I use this medication? This medication is injected into a vein. It is given by your care team in a hospital or clinic setting. Talk to your care team the use of this medication in children. Special care may be needed. Overdosage: If you think you have taken too much of this medicine contact a poison control center or emergency room at once. NOTE: This medicine is only for you. Do not share this medicine with others. What if I miss a dose? It is important not to miss your dose. Call your care team if you are unable to keep an appointment. What may interact with this medication? Other iron products This list may not describe all possible interactions. Give your health care provider a list of all the medicines, herbs, non-prescription drugs, or dietary supplements you use. Also tell them if you smoke, drink alcohol, or use illegal drugs. Some items may interact with your medicine. What should I watch for while using this medication? Visit your care team regularly. Tell your care team if your symptoms do not start to get better or if they get worse. You may need blood work done while you are taking this medication. You may need to follow a special diet. Talk to your care team. Foods that contain iron include: whole grains/cereals, dried fruits, beans, or peas, leafy green vegetables, and organ meats (liver, kidney). What side effects may I notice from receiving this medication? Side effects that you should report to your care team as soon  as possible: Allergic reactions--skin rash, itching, hives, swelling of the face, lips, tongue, or throat Low blood pressure--dizziness, feeling faint or lightheaded, blurry vision Shortness of breath Side effects that usually do not require medical attention (report to your care team if they continue or are bothersome): Flushing Headache Joint pain Muscle pain Nausea Pain, redness, or irritation at injection site This list  may not describe all possible side effects. Call your doctor for medical advice about side effects. You may report side effects to FDA at 1-800-FDA-1088. Where should I keep my medication? This medication is given in a hospital or clinic. It will not be stored at home. NOTE: This sheet is a summary. It may not cover all possible information. If you have questions about this medicine, talk to your doctor, pharmacist, or health care provider.  2024 Elsevier/Gold Standard (2022-05-28 00:00:00)       To help prevent nausea and vomiting after your treatment, we encourage you to take your nausea medication as directed.  BELOW ARE SYMPTOMS THAT SHOULD BE REPORTED IMMEDIATELY: *FEVER GREATER THAN 100.4 F (38 C) OR HIGHER *CHILLS OR SWEATING *NAUSEA AND VOMITING THAT IS NOT CONTROLLED WITH YOUR NAUSEA MEDICATION *UNUSUAL SHORTNESS OF BREATH *UNUSUAL BRUISING OR BLEEDING *URINARY PROBLEMS (pain or burning when urinating, or frequent urination) *BOWEL PROBLEMS (unusual diarrhea, constipation, pain near the anus) TENDERNESS IN MOUTH AND THROAT WITH OR WITHOUT PRESENCE OF ULCERS (sore throat, sores in mouth, or a toothache) UNUSUAL RASH, SWELLING OR PAIN  UNUSUAL VAGINAL DISCHARGE OR ITCHING   Items with * indicate a potential emergency and should be followed up as soon as possible or go to the Emergency Department if any problems should occur.  Please show the CHEMOTHERAPY ALERT CARD or IMMUNOTHERAPY ALERT CARD at check-in to the Emergency Department and triage nurse.  Should you have questions after your visit or need to cancel or reschedule your appointment, please contact [facility] - A DEPT OF [facility] [phone]  and follow the prompts.  Office hours are 8:00 a.m. to 4:30 p.m. Monday - Friday. Please note that voicemails left after 4:00 p.m. may not be returned until the following business day.  We are closed weekends and major holidays. You have access to  a nurse at all times for urgent questions. Please call the main number to the clinic [phone] and follow the prompts.  For any non-urgent questions, you may also contact your provider using MyChart. We now offer e-Visits for anyone 24 and older to request care online for non-urgent symptoms. For details visit [email or web address].   Also download the MyChart app! Go to the app store, search "MyChart", open the app, select [place], and log in with your MyChart username and password.

## 2022-11-23 DIAGNOSIS — H25812 Combined forms of age-related cataract, left eye: Secondary | ICD-10-CM | POA: Diagnosis not present

## 2022-11-23 DIAGNOSIS — H268 Other specified cataract: Secondary | ICD-10-CM | POA: Diagnosis not present

## 2022-12-09 DIAGNOSIS — J329 Chronic sinusitis, unspecified: Secondary | ICD-10-CM | POA: Diagnosis not present

## 2022-12-09 DIAGNOSIS — J3489 Other specified disorders of nose and nasal sinuses: Secondary | ICD-10-CM | POA: Diagnosis not present

## 2022-12-13 ENCOUNTER — Telehealth: Payer: Self-pay | Admitting: Physician Assistant

## 2022-12-13 NOTE — Telephone Encounter (Signed)
Patient called and stated that she wanted to speak to the nurse about her current symptoms with Acid reflux and stomach pain. Please advise.

## 2022-12-14 NOTE — Telephone Encounter (Signed)
Spoke with the patient. She is at another appointment and cannot talk right now.

## 2022-12-16 DIAGNOSIS — Z8719 Personal history of other diseases of the digestive system: Secondary | ICD-10-CM | POA: Diagnosis not present

## 2022-12-16 DIAGNOSIS — K5900 Constipation, unspecified: Secondary | ICD-10-CM | POA: Diagnosis not present

## 2022-12-16 DIAGNOSIS — K219 Gastro-esophageal reflux disease without esophagitis: Secondary | ICD-10-CM | POA: Diagnosis not present

## 2022-12-16 DIAGNOSIS — K3184 Gastroparesis: Secondary | ICD-10-CM | POA: Diagnosis not present

## 2022-12-16 NOTE — Telephone Encounter (Signed)
Increased belching and reflux. Constipation is "out of control again." On recent antibiotics for UTI  (took Cipro) and then sinusitis which was treated with Augmentin (d/c due to vomiting) and then azithromycin.  She has taken Carafate for the upper GI symptoms x1 and it helped. Afraid to continue due to her constipation. She takes nightly Metamucil and daily Linzess 145 mcg. Bowels move a "little bit over and over again all day. I am afraid to leave the house." She tried to purge with Miralax without relief.

## 2022-12-22 DIAGNOSIS — H43393 Other vitreous opacities, bilateral: Secondary | ICD-10-CM | POA: Diagnosis not present

## 2022-12-22 DIAGNOSIS — H35363 Drusen (degenerative) of macula, bilateral: Secondary | ICD-10-CM | POA: Diagnosis not present

## 2022-12-22 DIAGNOSIS — H43812 Vitreous degeneration, left eye: Secondary | ICD-10-CM | POA: Diagnosis not present

## 2022-12-22 DIAGNOSIS — H59811 Chorioretinal scars after surgery for detachment, right eye: Secondary | ICD-10-CM | POA: Diagnosis not present

## 2023-01-03 ENCOUNTER — Other Ambulatory Visit: Payer: Self-pay | Admitting: *Deleted

## 2023-01-03 DIAGNOSIS — Z122 Encounter for screening for malignant neoplasm of respiratory organs: Secondary | ICD-10-CM

## 2023-01-03 DIAGNOSIS — Z87891 Personal history of nicotine dependence: Secondary | ICD-10-CM

## 2023-01-14 DIAGNOSIS — R0981 Nasal congestion: Secondary | ICD-10-CM | POA: Diagnosis not present

## 2023-01-14 DIAGNOSIS — J020 Streptococcal pharyngitis: Secondary | ICD-10-CM | POA: Diagnosis not present

## 2023-01-19 ENCOUNTER — Ambulatory Visit: Payer: Medicare Other | Admitting: Internal Medicine

## 2023-01-19 ENCOUNTER — Other Ambulatory Visit: Payer: Self-pay

## 2023-01-19 ENCOUNTER — Encounter: Payer: Self-pay | Admitting: Internal Medicine

## 2023-01-19 ENCOUNTER — Telehealth: Payer: Self-pay | Admitting: Physician Assistant

## 2023-01-19 VITALS — BP 116/72 | HR 88 | Ht 59.0 in | Wt 105.0 lb

## 2023-01-19 DIAGNOSIS — J449 Chronic obstructive pulmonary disease, unspecified: Secondary | ICD-10-CM | POA: Diagnosis not present

## 2023-01-19 DIAGNOSIS — K5730 Diverticulosis of large intestine without perforation or abscess without bleeding: Secondary | ICD-10-CM | POA: Insufficient documentation

## 2023-01-19 DIAGNOSIS — F418 Other specified anxiety disorders: Secondary | ICD-10-CM | POA: Insufficient documentation

## 2023-01-19 DIAGNOSIS — R079 Chest pain, unspecified: Secondary | ICD-10-CM | POA: Diagnosis not present

## 2023-01-19 DIAGNOSIS — F17210 Nicotine dependence, cigarettes, uncomplicated: Secondary | ICD-10-CM | POA: Diagnosis not present

## 2023-01-19 DIAGNOSIS — M1990 Unspecified osteoarthritis, unspecified site: Secondary | ICD-10-CM | POA: Insufficient documentation

## 2023-01-19 DIAGNOSIS — E785 Hyperlipidemia, unspecified: Secondary | ICD-10-CM | POA: Insufficient documentation

## 2023-01-19 DIAGNOSIS — J020 Streptococcal pharyngitis: Secondary | ICD-10-CM | POA: Insufficient documentation

## 2023-01-19 DIAGNOSIS — M810 Age-related osteoporosis without current pathological fracture: Secondary | ICD-10-CM | POA: Insufficient documentation

## 2023-01-19 DIAGNOSIS — R5383 Other fatigue: Secondary | ICD-10-CM | POA: Insufficient documentation

## 2023-01-19 DIAGNOSIS — F329 Major depressive disorder, single episode, unspecified: Secondary | ICD-10-CM | POA: Insufficient documentation

## 2023-01-19 DIAGNOSIS — I10 Essential (primary) hypertension: Secondary | ICD-10-CM | POA: Insufficient documentation

## 2023-01-19 DIAGNOSIS — J3489 Other specified disorders of nose and nasal sinuses: Secondary | ICD-10-CM | POA: Insufficient documentation

## 2023-01-19 DIAGNOSIS — R4589 Other symptoms and signs involving emotional state: Secondary | ICD-10-CM | POA: Insufficient documentation

## 2023-01-19 MED ORDER — HYDROCORTISONE ACETATE 25 MG RE SUPP: 25.0000 mg | Freq: Every evening | RECTAL | 1 refills | Status: AC | PRN

## 2023-01-19 NOTE — Assessment & Plan Note (Signed)
 Counseled re importance of smoking cessation but did not meet time criteria for separate billing           Each maintenance medication was reviewed in detail including emphasizing most importantly the difference between maintenance and prns and under what circumstances the prns are to be triggered using an action plan format where appropriate.  Total time for H and P, chart review, counseling,   and generating customized AVS unique to this office visit / same day charting  > 

## 2023-01-19 NOTE — Assessment & Plan Note (Signed)
 Onset p covid Fev 2022  - neg cards w/u for cp 2012 ([facility])  - Referred to cardiology 05/12/2021 for new pattern cp since covid 02/2020  - LHC  07/22/21 no critical CAD > control risk factors  Resolved as of 01/19/2023

## 2023-01-19 NOTE — Patient Instructions (Addendum)
 My office will be contacting you by phone for referral to lung cancer screening   [phone]) and PFTs -  call in a week if not contacted about the scan.  The key is to stop smoking completely before smoking completely stops you!

## 2023-01-19 NOTE — Progress Notes (Signed)
 Ruth Terry, female    DOB: 24-Dec-1955  MRN: [id number]   Brief patient profile:  65   yowf  quit smoking transiently  April 2023 self referred to pulmonary clinic in [place]  05/12/2021 for doe since covid Feb 2022     History of Present Illness  05/12/2021  Pulmonary/ 1st office eval/ Dynastee Brummell / Sales executive Complaint  Patient presents with   Consult    Self referral for lung nodule seen on CT scan  Patient also states since she had covid she has had chest tightness/pain and sometimes feels she is "suffocating"   Dyspnea:  walks 15-20 min some hill some hills over a year and about 25% of the time has chest discomfort that radiates to top of shoulders bilaterally - typically walks after supper  Cough: none  Sleep: on side/ 2 pillows bed is flat  SABA use: no inhalers Nasal congestion since covid  Has GERD on Boniva   Rec Hold Boniva  for now and consider Reclast infusions once a year or Prolia  injections twice a year  Add pepcid  20 mg after supper  I will be referring for possible angina to the [place] [facility] at [facility] Please schedule a follow up visit in 3 months but call sooner if needed with pfts on return   [facility]  07/22/21 no critical CAD > control risk factors    01/19/2023  f/u ov/[place] office/Anab Vivar re: emphysema on CT  maint on no resp rx   Chief Complaint  Patient presents with   COPD   Shortness of Breath  Dyspnea:  on feet all day = MMRC1 = can walk nl pace, flat grade, can't hurry or go uphills or steps s sob   Cough: none  Sleeping: bed is flat 2 pillows  s   resp cc  SABA use: none  02: none   Lung cancer screening: referred today    No obvious day to day or daytime variability or assoc excess/ purulent sputum or mucus plugs or hemoptysis or cp or chest tightness, subjective wheeze or overt sinus or hb symptoms.    Also denies any obvious fluctuation of symptoms with weather or environmental changes or other aggravating or alleviating factors  except as outlined above   No unusual exposure hx or h/o childhood pna/ asthma or knowledge of premature birth.  Current Allergies, Complete Past Medical History, Past Surgical History, Family History, and Social History were reviewed in [employer] record.  ROS  The following are not active complaints unless bolded Hoarseness, sore throat, dysphagia, dental problems, itching, sneezing,  nasal congestion or discharge of excess mucus or purulent secretions, ear ache,   fever, chills, sweats, unintended wt loss or wt gain, classically pleuritic or exertional cp,  orthopnea pnd or arm/hand swelling  or leg swelling, presyncope, palpitations, abdominal pain, anorexia, nausea, vomiting, diarrhea  or change in bowel habits or change in bladder habits, change in stools or change in urine, dysuria, hematuria,  rash, arthralgias, visual complaints, headache, numbness, weakness or ataxia or problems with walking or coordination,  change in mood or  memory.        Current Meds  Medication Sig   alfuzosin  (UROXATRAL ) 10 MG 24 hr tablet Take 1 tablet (10 mg total) by mouth daily with breakfast.   aspirin  EC 81 MG tablet Take 1 tablet (81 mg total) by mouth daily. Swallow whole.   cefdinir  (OMNICEF ) 300 MG capsule Take 300 mg by mouth 2 (two) times daily.  Cholecalciferol (VITAMIN D3 PO) Take 1 tablet by mouth daily.   docusate sodium  (COLACE) 100 MG capsule Take 1 capsule (100 mg total) by mouth 2 (two) times daily.   ezetimibe  (ZETIA ) 10 MG tablet Take 1 tablet (10 mg total) by mouth daily.   famotidine  (PEPCID ) 20 MG tablet One after supper   fluticasone  (FLONASE ) 50 MCG/ACT nasal spray Place 1 spray into both nostrils daily as needed for allergies or rhinitis.   hydrocortisone  (ANUSOL -HC) 25 MG suppository Place 1 suppository (25 mg total) rectally at bedtime as needed for hemorrhoids or anal itching. For 5 days   hyoscyamine  (LEVSIN SL) 0.125 MG SL tablet Place 1 tablet (0.125 mg  total) under the tongue every 6 (six) hours as needed.   levocetirizine (XYZAL ) 5 MG tablet Take 1 tablet (5 mg total) by mouth every evening. For allergies   Lido-PE-Mineral Oil-Petrolatum (RECTICARE ADVANCED) 5-0.25-17-39 % CREA Use three times daily as needed   Lidocaine , Anorectal, (RECTICARE) 5 % CREA Apply 1 Application topically as needed.   linaclotide  (LINZESS ) 145 MCG CAPS capsule TAKE 1 CAPSULE BY MOUTH EVERY MORNING BEFORE BREAKFAST   nitroGLYCERIN  (NITROSTAT ) 0.4 MG SL tablet Place 1 tablet (0.4 mg total) under the tongue every 5 (five) minutes as needed for chest pain.   ondansetron  (ZOFRAN -ODT) 8 MG disintegrating tablet Take 8 mg by mouth every 8 (eight) hours as needed.   pantoprazole  (PROTONIX ) 40 MG tablet TAKE 1 TABLET BY MOUTH EVERY MORNING BEFORE BREAKFAST   pravastatin  (PRAVACHOL ) 10 MG tablet Take 1 tablet (10 mg total) by mouth 3 (three) times a week. Take on Mondays, Wednesdays, and Fridays.   psyllium (METAMUCIL) 58.6 % packet Take 1 packet by mouth every evening.   senna-docusate (SENOKOT S) 8.6-50 MG tablet Take 1-2 tablets by mouth at bedtime.   Simethicone  (PHAZYME MAXIMUM STRENGTH) 250 MG CAPS Take 250 mg by mouth 3 (three) times daily. (Patient taking differently: Take 250 mg by mouth 3 (three) times daily as needed (gas relief).)   sucralfate  (CARAFATE ) 1 GM/10ML suspension Take 10 mLs (1 g total) by mouth 3 (three) times daily.   valACYclovir  (VALTREX ) 1000 MG tablet TAKE 1 TABLET (1,000 MG TOTAL) BY MOUTH AT BEDTIME.                Past Medical History:  Diagnosis Date   Allergy    Anal fissure    Cataract    Chronic constipation    Diverticulosis 04/20/2010   colonoscopy   Gastroparesis    GERD (gastroesophageal reflux disease)    Hemorrhoids    Hyperlipidemia    IBS (irritable bowel syndrome)    MVP (mitral valve prolapse)    Osteoporosis    Polyp, stomach 04/20/2010   egd   Shingles    UTI (lower urinary tract infection)         Objective:     wts  01/19/2023       105  05/12/21 99 lb 3.2 oz (45 kg)  04/29/21 98 lb 4 oz (44.6 kg)  03/26/21 96 lb 9.6 oz (43.8 kg)     Vital signs reviewed  01/19/2023  - Note at rest 02 sats  98% on RA   General appearance:    pleasant amb wf nad     HEENT : Oropharynx  clear      NECK :  without  apparent JVD/ palpable Nodes/TM    LUNGS: no acc muscle use,  Min barrel  contour/ kyphotic  chest wall with  bilateral  slightly decreased bs s audible wheeze and  without cough on insp or exp maneuvers and min  Hyperresonant  to  percussion bilaterally    CV:  RRR  no s3 or murmur or increase in P2, and no edema   ABD:  soft and nontender    MS:  Nl gait/ ext warm without deformities Or obvious joint restrictions  calf tenderness, cyanosis or clubbing     SKIN: warm and dry without lesions    NEURO:  alert, approp, nl sensorium with  no motor or cerebellar deficits apparent.                I personally reviewed images and agree with radiology impression as follows:   Chest LDSCT     10/08/22 1. Lung-RADS 2, benign appearance or behavior. Continue annual screening with low-dose chest CT without contrast in 12 months. 2. Aortic atherosclerosis (ICD10-I70.0). Coronary artery calcification. 3.  Emphysema (ICD10-J43.9).    My review:  mild centrilobular emphysema       Assessment

## 2023-01-19 NOTE — Assessment & Plan Note (Addendum)
 Quit smoking transiently  April 2023  - 05/12/2021   Walked on RA  x  3  lap(s) =  approx 450  ft  @ moderate pace, stopped due to end of study  with lowest 02 sats 96% and sob on 2nd lap, some chest discomfort with ex but the reported it was present before exerted as well (this was not the original hx)  -  Chest LDSCT     10/08/22 mild centrilobular emphysema  -  PFT  01/19/2023 >>>   Clinically very mild copd but needs pfts to define it better   In meantime,   I reviewed the Fletcher curve with the patient that basically indicates that  if you quit smoking when your best day FEV1 is still well preserved (as is likely   the case here)  it is highly unlikely you will progress to severe disease and informed the patient there was  no medication on the market that has proven to alter the curve/ its downward trajectory  or the likelihood of progression of their disease(unlike other chronic medical conditions such as atheroclerosis where we do think we can change the natural hx with risk reducing meds)    Therefore stopping smoking and maintaining abstinence are  the most important aspects of her  care, not choice of inhalers or for that matter, Pulmonary doctors.   Treatment other than smoking cessation  is entirely directed by severity of symptoms and focused also on reducing exacerbations, not attempting to change the natural history of the disease. Should either issue become problematic, then escalation of maintenance and prn resp meds may be warranted "the punishment needs to fit the crime"

## 2023-01-19 NOTE — Telephone Encounter (Signed)
 Patient called stating she has strep throat currently on antibiotics since Friday, is seeking over the counter medication advise to help her digestive track.

## 2023-01-19 NOTE — Telephone Encounter (Signed)
 Patient reports she has been on antibiotics 3 times in less than 6 months. She feels this has "torn up" her stomach, "something is wrong, I know it is." She also wants a refill on hydrocortisone  suppositories for hemorrhoids. Patient will complete her present course of antibiotics. She will add yogurt to her diet for her "upset stomach, gas and bloating." Once she has completed the antibiotics she can consider taking the probiotics she has already purchased. Refill on the suppositories as requested for rectal irritation.

## 2023-01-20 ENCOUNTER — Telehealth: Payer: Self-pay | Admitting: Internal Medicine

## 2023-01-20 NOTE — Telephone Encounter (Signed)
Spoke with patient regarding the 03/02/23 2:00 pm PFT appointment at [facility] at 1:45 pm for check in--1st floor registration desk.  Will mail information to patient regarding appointment and instructions.  Patient voiced her understanding.

## 2023-01-24 ENCOUNTER — Inpatient Hospital Stay: Payer: Medicare Other | Attending: Hematology

## 2023-01-24 DIAGNOSIS — E611 Iron deficiency: Secondary | ICD-10-CM | POA: Insufficient documentation

## 2023-01-24 DIAGNOSIS — D508 Other iron deficiency anemias: Secondary | ICD-10-CM

## 2023-01-24 LAB — CBC
HCT: 43.9 % (ref 36.0–46.0)
Hemoglobin: 14.9 g/dL (ref 12.0–15.0)
MCH: 31 pg (ref 26.0–34.0)
MCHC: 33.9 g/dL (ref 30.0–36.0)
MCV: 91.3 fL (ref 80.0–100.0)
Platelets: 208 10*3/uL (ref 150–400)
RBC: 4.81 MIL/uL (ref 3.87–5.11)
RDW: 16.3 % — ABNORMAL HIGH (ref 11.5–15.5)
WBC: 4.8 10*3/uL (ref 4.0–10.5)
nRBC: 0 % (ref 0.0–0.2)

## 2023-01-24 LAB — IRON AND TIBC
Iron: 93 ug/dL (ref 28–170)
Saturation Ratios: 27 % (ref 10.4–31.8)
TIBC: 342 ug/dL (ref 250–450)
UIBC: 249 ug/dL

## 2023-01-24 LAB — VITAMIN B12: Vitamin B-12: 321 pg/mL (ref 180–914)

## 2023-01-24 LAB — FERRITIN: Ferritin: 106 ng/mL (ref 11–307)

## 2023-01-26 ENCOUNTER — Other Ambulatory Visit: Payer: Self-pay | Admitting: Physician Assistant

## 2023-01-26 LAB — COPPER, SERUM: Copper: 115 ug/dL (ref 80–158)

## 2023-01-26 LAB — METHYLMALONIC ACID, SERUM: Methylmalonic Acid, Quantitative: 351 nmol/L (ref 0–378)

## 2023-01-29 NOTE — Progress Notes (Unsigned)
[facility] [address], [place] [address]   CLINIC:  Medical Oncology/Hematology  PCP:  Syed Zukas [place], NP [address] [place] [address] [phone]   REASON FOR VISIT:  Follow-up for severe iron deficiency state  CURRENT THERAPY: IV iron as needed  INTERVAL HISTORY:   Ruth Terry 69 y.o. female returns for routine follow-up of severe iron deficiency state without anemia.  She was last seen by Dr. Ellin Saba on 10/26/2022.  She receives Feraheme x 2 in October/November 2024.  At today's visit, she reports feeling fair.  She reports ongoing fatigue that is possibly related to caregiver fatigue and caring for her aging father and mother.  She also continues to grieve the loss of her husband, who passed away in Apr 06, 2022.  She denies any recent hospitalizations, surgeries, or changes in baseline health status.  She reports feeling no significant improvement after IV iron in October/November 2024.  She denies any rectal bleeding or melena.  She denies any pica, chest pain, or dyspnea on exertion.  She does report lightheadedness, especially when standing.  She reports generalized weakness and fatigue.  She admits that she does not drink as much water as she got to.  She has 60% energy and 75% appetite. She endorses that she is maintaining a stable weight.   ASSESSMENT & PLAN:  1.  Severe iron deficiency state: - Labs from October 2024 showed severe iron deficiency state (ferritin 4, iron saturation 7%), but without anemia (Hgb 12.1) - She has not taken iron tablet as she has gastroparesis, GERD and IBS. - No transfusion history. - Colonoscopy (12/09/2020): Few diverticula in the sigmoid colon and internal hemorrhoids. - EGD (12/09/2020): Normal esophagus, stomach and duodenum - Received IV Feraheme x 2 in October/November 2024  - No rectal bleeding or melena.   - No ice pica.  Reports generalized weakness, fatigue, and lightheadedness. - Most recent  labs (01/24/2023): Hgb 14.9/MCV 91.3, ferritin 106, iron saturation 27% with normal TIBC 542. Additional labs with marginal B12 at 321, normal MMA.  Normal copper. - PLAN: No indication for IV iron at this time. - We will recheck labs with RTC in 4 months.   - Recommend starting vitamin B12 500 mcg 3 days weekly.  Recheck B12/MMA at follow-up.  2.  Social/Family History: - Semi-retired as a Personal assistant, working at home. Tobacco use of 5 cigarettes a day for 23 years.  - Father receives iron infusions at [facility]. No family history of cancer.   PLAN SUMMARY: >> Labs in 4 months = CBC/D, ferritin, iron/TIBC, B12, MMA >> PHONE or OFFICE visit in 4 months (1 week after labs)     REVIEW OF SYSTEMS:   Review of Systems  Constitutional:  Positive for fatigue. Negative for appetite change, chills, diaphoresis, fever and unexpected weight change.  HENT:   Negative for lump/mass and nosebleeds.   Eyes:  Negative for eye problems.  Respiratory:  Negative for cough, hemoptysis and shortness of breath.   Cardiovascular:  Negative for chest pain, leg swelling and palpitations.  Gastrointestinal:  Positive for abdominal pain, diarrhea and nausea. Negative for blood in stool, constipation and vomiting.  Genitourinary:  Negative for hematuria.   Skin: Negative.   Neurological:  Positive for light-headedness. Negative for dizziness and headaches.  Hematological:  Does not bruise/bleed easily.     PHYSICAL EXAM:  ECOG PERFORMANCE STATUS: 1 - Symptomatic but completely ambulatory  Vitals:   01/31/23 1306  BP: 125/75  Pulse: 96  Resp: 16  Temp: 98.2 F (36.8 C)  SpO2: 100%   Filed Weights   01/31/23 1306  Weight: 106 lb 4.2 oz (48.2 kg)   Physical Exam Constitutional:      Appearance: Normal appearance. She is normal weight.  Cardiovascular:     Rate and Rhythm: Tachycardia present.     Heart sounds: Normal heart sounds.     Comments: HR 96 Pulmonary:     Breath sounds: Normal breath  sounds.  Neurological:     General: No focal deficit present.     Mental Status: Mental status is at baseline.  Psychiatric:        Behavior: Behavior normal. Behavior is cooperative.     PAST MEDICAL/SURGICAL HISTORY:  Past Medical History:  Diagnosis Date   Allergy    Anal fissure    Anemia, iron deficiency 10/26/2022   Cataract    Chronic constipation    Coronary artery disease    Diverticulosis 04/20/2010   colonoscopy   Gastroparesis    GERD (gastroesophageal reflux disease)    Hemorrhoids    Hemorrhoids    Hyperlipidemia    IBS (irritable bowel syndrome)    MVP (mitral valve prolapse)    as a teenager per patient, no issues since   Osteoporosis    Polyp, stomach 04/20/2010   egd   Shingles    UTI (lower urinary tract infection)    Past Surgical History:  Procedure Laterality Date   CHOLECYSTECTOMY N/A 09/21/2021   Procedure: LAPAROSCOPIC CHOLECYSTECTOMY;  Surgeon: Fritzi Mandes, MD;  Location: [facility] OR;  Service: General;  Laterality: N/A;   COLONOSCOPY     CORONARY PRESSURE/FFR STUDY N/A 07/22/2021   Procedure: INTRAVASCULAR PRESSURE WIRE/FFR STUDY;  Surgeon: Lyn Records, MD;  Location: [facility] INVASIVE CV LAB;  Service: Cardiovascular;  Laterality: N/A;   ESOPHAGOGASTRODUODENOSCOPY     LEFT HEART CATH AND CORONARY ANGIOGRAPHY N/A 07/22/2021   Procedure: LEFT HEART CATH AND CORONARY ANGIOGRAPHY;  Surgeon: Lyn Records, MD;  Location: [facility] INVASIVE CV LAB;  Service: Cardiovascular;  Laterality: N/A;    SOCIAL HISTORY:  Social History   Socioeconomic History   Marital status: Married    Spouse name: Not on file   Number of children: 0   Years of education: Not on file   Highest education level: Not on file  Occupational History   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    Employer: [employer]  Tobacco Use   Smoking status: Some Days    Current packs/day: 0.00    Average packs/day: 1 pack/day for 20.0 years (20.0 ttl  pk-yrs)    Types: Cigarettes    Start date: 05/2000    Last attempt to quit: 05/2020    Years since quitting: 2.7   Smokeless tobacco: Never   Tobacco comments:    USE SMOKELESS CIGARETTES  Vaping Use   Vaping status: Some Days   Substances: Nicotine  Substance and Sexual Activity   Alcohol use: Not Currently    Comment: 1 a week, wine   Drug use: No   Sexual activity: Not Currently  Other Topics Concern   Not on file  Social History Narrative   Not on file   Social Drivers of Health   Financial Resource Strain: Not on file  Food Insecurity: Not on file  Transportation Needs: Not on file  Physical Activity: Not on file  Stress: Not on file  Social Connections: Not on file  Intimate Partner Violence: Not on file    FAMILY HISTORY:  Family History  Problem Relation Age of Onset   Hypertension Mother    Diabetes Father    Heart attack Father        Age 35   Colon cancer Neg Hx    Esophageal cancer Neg Hx    Rectal cancer Neg Hx     CURRENT MEDICATIONS:  Outpatient Encounter Medications as of 01/31/2023  Medication Sig   alfuzosin (UROXATRAL) 10 MG 24 hr tablet Take 1 tablet (10 mg total) by mouth daily with breakfast.   aspirin EC 81 MG tablet Take 1 tablet (81 mg total) by mouth daily. Swallow whole.   cefdinir (OMNICEF) 300 MG capsule Take 300 mg by mouth 2 (two) times daily.   Cholecalciferol (VITAMIN D3 PO) Take 1 tablet by mouth daily.   docusate sodium (COLACE) 100 MG capsule Take 1 capsule (100 mg total) by mouth 2 (two) times daily.   ezetimibe (ZETIA) 10 MG tablet Take 1 tablet (10 mg total) by mouth daily.   famotidine (PEPCID) 20 MG tablet One after supper   fluticasone (FLONASE) 50 MCG/ACT nasal spray Place 1 spray into both nostrils daily as needed for allergies or rhinitis.   hydrocortisone (ANUSOL-HC) 25 MG suppository Place 1 suppository (25 mg total) rectally at bedtime as needed for hemorrhoids or anal itching. For 5 days   hyoscyamine (LEVSIN SL)  0.125 MG SL tablet Place 1 tablet (0.125 mg total) under the tongue every 6 (six) hours as needed.   levocetirizine (XYZAL) 5 MG tablet Take 1 tablet (5 mg total) by mouth every evening. For allergies   Lido-PE-Mineral Oil-Petrolatum (RECTICARE ADVANCED) 5-0.25-17-39 % CREA Use three times daily as needed   Lidocaine, Anorectal, (RECTICARE) 5 % CREA Apply 1 Application topically as needed.   linaclotide (LINZESS) 145 MCG CAPS capsule TAKE 1 CAPSULE BY MOUTH EVERY MORNING BEFORE BREAKFAST   nitroGLYCERIN (NITROSTAT) 0.4 MG SL tablet Place 1 tablet (0.4 mg total) under the tongue every 5 (five) minutes as needed for chest pain.   ondansetron (ZOFRAN-ODT) 8 MG disintegrating tablet Take 8 mg by mouth every 8 (eight) hours as needed.   pantoprazole (PROTONIX) 40 MG tablet TAKE 1 TABLET BY MOUTH EVERY MORNING BEFORE BREAKFAST   pravastatin (PRAVACHOL) 10 MG tablet Take 1 tablet (10 mg total) by mouth 3 (three) times a week. Take on Mondays, Wednesdays, and Fridays.   psyllium (METAMUCIL) 58.6 % packet Take 1 packet by mouth every evening.   senna-docusate (SENOKOT S) 8.6-50 MG tablet Take 1-2 tablets by mouth at bedtime.   Simethicone (PHAZYME MAXIMUM STRENGTH) 250 MG CAPS Take 250 mg by mouth 3 (three) times daily. (Patient taking differently: Take 250 mg by mouth 3 (three) times daily as needed (gas relief).)   sucralfate (CARAFATE) 1 GM/10ML suspension Take 10 mLs (1 g total) by mouth 3 (three) times daily.   valACYclovir (VALTREX) 1000 MG tablet TAKE 1 TABLET (1,000 MG TOTAL) BY MOUTH AT BEDTIME.   [DISCONTINUED] AMBULATORY NON FORMULARY MEDICATION Medication Name: Domperidone 10 mg Take 1 tablet at bedtime daily. (Patient taking differently: Take 1 tablet by mouth at bedtime. Medication Name: Domperidone 10 mg Take 1 tablet at bedtime daily.)   No facility-administered encounter medications on file as of 01/31/2023.    ALLERGIES:  Allergies  Allergen Reactions   Amoxicillin-Pot Clavulanate  Nausea And Vomiting   Chlordiazepoxide-Clidinium Itching    (Librax)   Ciprofloxacin Itching  Irritates stomach   Flagyl [Metronidazole]     Severe yeast infection   Sulfa Drugs Cross Reactors Nausea Only   Nitrofurantoin Monohyd Macro Rash   Omnicef [Cefdinir] Nausea And Vomiting    LABORATORY DATA:  I have reviewed the labs as listed.  CBC    Component Value Date/Time   WBC 4.8 01/24/2023 1229   RBC 4.81 01/24/2023 1229   HGB 14.9 01/24/2023 1229   HGB 12.4 06/14/2022 1339   HCT 43.9 01/24/2023 1229   HCT 37.0 06/14/2022 1339   PLT 208 01/24/2023 1229   PLT 211 06/14/2022 1339   MCV 91.3 01/24/2023 1229   MCV 81 06/14/2022 1339   MCH 31.0 01/24/2023 1229   MCHC 33.9 01/24/2023 1229   RDW 16.3 (H) 01/24/2023 1229   RDW 13.6 06/14/2022 1339   LYMPHSABS 1.2 10/12/2022 1419   LYMPHSABS 1.6 06/14/2022 1339   MONOABS 0.4 10/12/2022 1419   EOSABS 0.2 10/12/2022 1419   EOSABS 0.1 06/14/2022 1339   BASOSABS 0.0 10/12/2022 1419   BASOSABS 0.0 06/14/2022 1339      Latest Ref Rng & Units 08/27/2022    3:18 PM 06/04/2022    4:51 PM 12/09/2021    2:37 PM  CMP  Glucose 70 - 99 mg/dL 95  409  87   BUN 6 - 23 mg/dL 11  9  5    Creatinine 0.40 - 1.20 mg/dL 8.11  9.14  7.82   Sodium 135 - 145 mEq/L 142  137  141   Potassium 3.5 - 5.1 mEq/L 3.5  3.5  3.5   Chloride 96 - 112 mEq/L 105  103  105   CO2 19 - 32 mEq/L 28  25  29    Calcium 8.4 - 10.5 mg/dL 9.1  8.9  9.0   Total Protein 6.0 - 8.3 g/dL 6.5  6.5  6.8   Total Bilirubin 0.2 - 1.2 mg/dL 0.2  0.4  0.2   Alkaline Phos 39 - 117 U/L 75  69  73   AST 0 - 37 U/L 10  14  12    ALT 0 - 35 U/L 9  11  8      DIAGNOSTIC IMAGING:  I have independently reviewed the relevant imaging and discussed with the patient.   WRAP UP:  All questions were answered. The patient knows to call the clinic with any problems, questions or concerns.  Medical decision making: Moderate  Time spent on visit: I spent 20 minutes counseling the patient  face to face. The total time spent in the appointment was 30 minutes and more than 50% was on counseling.  Carnella Guadalajara, PA-C  01/31/23 1:39 PM

## 2023-01-31 ENCOUNTER — Inpatient Hospital Stay (HOSPITAL_BASED_OUTPATIENT_CLINIC_OR_DEPARTMENT_OTHER): Payer: Medicare Other | Admitting: Physician Assistant

## 2023-01-31 VITALS — BP 125/75 | HR 96 | Temp 98.2°F | Resp 16 | Wt 106.3 lb

## 2023-01-31 DIAGNOSIS — D508 Other iron deficiency anemias: Secondary | ICD-10-CM

## 2023-01-31 DIAGNOSIS — E611 Iron deficiency: Secondary | ICD-10-CM | POA: Diagnosis not present

## 2023-01-31 DIAGNOSIS — E538 Deficiency of other specified B group vitamins: Secondary | ICD-10-CM

## 2023-01-31 NOTE — Patient Instructions (Signed)
[facility] at [facility] **VISIT SUMMARY & IMPORTANT INSTRUCTIONS **   You were seen today by Rojelio Brenner PA-C for your follow-up visit.    IRON DEFICIENCY Your blood and iron levels look great! You do not need any IV iron at this time.  VITAMIN B12 DEFICIENCY Your vitamin B12 levels are on the lower side of normal. I recommend that you start taking vitamin B12 500 mcg three times each week (Monday, Wednesday, Friday).  This is available over-the-counter.  FOLLOW-UP APPOINTMENT: Labs and follow-up visit in 4 months  ** Thank you for trusting me with your healthcare!  I strive to provide all of my patients with quality care at each visit.  If you receive a survey for this visit, I would be so grateful to you for taking the time to provide feedback.  Thank you in advance!  ~ Emmah Bratcher                   Dr. Doreatha Massed   &   Rojelio Brenner, PA-C   - - - - - - - - - - - - - - - - - -    Thank you for choosing [facility] at [facility] to provide your oncology and hematology care.  To afford each patient quality time with our provider, please arrive at least 15 minutes before your scheduled appointment time.   If you have a lab appointment with the Cancer Center please come in thru the Main Entrance and check in at the main information desk.  You need to re-schedule your appointment should you arrive 10 or more minutes late.  We strive to give you quality time with our providers, and arriving late affects you and other patients whose appointments are after yours.  Also, if you no show three or more times for appointments you may be dismissed from the clinic at the providers discretion.     Again, thank you for choosing [facility].  Our hope is that these requests will decrease the amount of time that you wait before being seen by our physicians.        _____________________________________________________________  Should you have questions after your visit to [facility], please contact our office at [phone] and follow the prompts.  Our office hours are 8:00 a.m. and 4:30 p.m. Monday - Friday.  Please note that voicemails left after 4:00 p.m. may not be returned until the following business day.  We are closed weekends and major holidays.  You do have access to a nurse 24-7, just call the main number to the clinic [phone] and do not press any options, hold on the line and a nurse will answer the phone.    For prescription refill requests, have your pharmacy contact our office and allow 72 hours.

## 2023-02-03 ENCOUNTER — Ambulatory Visit: Payer: Medicare Other | Admitting: Physician Assistant

## 2023-02-03 ENCOUNTER — Encounter: Payer: Self-pay | Admitting: Physician Assistant

## 2023-02-03 VITALS — BP 110/62 | HR 98 | Ht 59.0 in | Wt 105.0 lb

## 2023-02-03 DIAGNOSIS — K581 Irritable bowel syndrome with constipation: Secondary | ICD-10-CM

## 2023-02-03 DIAGNOSIS — G8929 Other chronic pain: Secondary | ICD-10-CM | POA: Diagnosis not present

## 2023-02-03 DIAGNOSIS — R197 Diarrhea, unspecified: Secondary | ICD-10-CM

## 2023-02-03 DIAGNOSIS — K219 Gastro-esophageal reflux disease without esophagitis: Secondary | ICD-10-CM | POA: Diagnosis not present

## 2023-02-03 DIAGNOSIS — R110 Nausea: Secondary | ICD-10-CM

## 2023-02-03 DIAGNOSIS — R142 Eructation: Secondary | ICD-10-CM

## 2023-02-03 DIAGNOSIS — R140 Abdominal distension (gaseous): Secondary | ICD-10-CM

## 2023-02-03 DIAGNOSIS — R112 Nausea with vomiting, unspecified: Secondary | ICD-10-CM

## 2023-02-03 DIAGNOSIS — K588 Other irritable bowel syndrome: Secondary | ICD-10-CM

## 2023-02-03 DIAGNOSIS — R1013 Epigastric pain: Secondary | ICD-10-CM

## 2023-02-03 MED ORDER — ONDANSETRON HCL 4 MG PO TABS: 4.0000 mg | ORAL_TABLET | Freq: Four times a day (QID) | ORAL | 2 refills | Status: AC | PRN

## 2023-02-03 NOTE — Patient Instructions (Addendum)
Your provider has requested that you go to the basement level for lab work before leaving today. Press "B" on the elevator. The lab is located at the first door on the left as you exit the elevator. Per your request you can drop off your stool studies at [facility] [address]  We will be faxing the orders over   Use FDgard as directed  We have sent the following medications to your pharmacy for you to pick up at your convenience: Motegrity 2 mg  Zofran refills   Try Florastor twice a day or Align once daily for 1 month  Due to recent changes in healthcare laws, you may see the results of your imaging and laboratory studies on MyChart before your provider has had a chance to review them.  We understand that in some cases there may be results that are confusing or concerning to you. Not all laboratory results come back in the same time frame and the provider may be waiting for multiple results in order to interpret others.  Please give [place] 48 hours in order for your provider to thoroughly review all the results before contacting the office for clarification of your results.    I appreciate the  opportunity to care for you  Thank You   [name] Myrtie Hawk

## 2023-02-03 NOTE — Progress Notes (Signed)
Subjective:    Patient ID: Ruth Terry, female    DOB: 04-19-1955, 68 y.o.   MRN: [id number]  HPI  Lady is a 68 year old white female, established with Dr. Marina Goodell and also known to myself who comes in today for follow-up.  She was last seen here in August 2024.  She has history of chronic GERD, dyspepsia, epigastric pain, chronic constipation, previous history of gastroparesis, osteoporosis, hypertension, COPD, anxiety/depression and was diagnosed with iron deficiency around the time of her last visit here.  She was found to have a mild anemia with hemoglobin in the 11.5 range, and iron studies were consistent with iron deficiency.  She was referred to hematology/[place] and has had iron infusions with Feraheme in October and November. She just had follow-up with hematology and on 01/24/2023 hemoglobin 14.9/serum iron 93/iron sat 27/TIBC 342 and ferritin 106.  She comes in today saying she has been feeling poorly in general over the past several weeks, fatigued, weak and sometimes lightheaded.  She has had 3 recent infections all requiring antibiotics.  Initially she had a UTI, then a sinus infection, then most recently strep pharyngitis for which she took a course of cefdinir and completed that about a week ago.  Now over the past several weeks she has been having nausea and belching.  She still gets a sensation that her food sits in her upper abdomen/right upper quadrant with intermittent discomfort.  Her bowel movements are "reversed" for her now having several bowel movements per day of which she describes as watery diarrhea associated with white phlegm and malodorous.  She has not seen any blood.  No recent fever, no vomiting. She has stayed on Linzess 145 mcg during this episode with diarrhea as she always fears constipation.  She also continues on Protonix 40 mg daily and Pepcid 20 mg every afternoon, and uses Levsin sublingual as needed.  Last colonoscopy and EGD were done in December  2022, colonoscopy had a few diverticuli and internal hemorrhoids, no polyps, and EGD was unremarkable.  Review of Systems Pertinent positive and negative review of systems were noted in the above HPI section.  All other review of systems was otherwise negative.   Outpatient Encounter Medications as of 02/03/2023  Medication Sig   alfuzosin (UROXATRAL) 10 MG 24 hr tablet Take 1 tablet (10 mg total) by mouth daily with breakfast.   aspirin EC 81 MG tablet Take 1 tablet (81 mg total) by mouth daily. Swallow whole.   Cholecalciferol (VITAMIN D3 PO) Take 1 tablet by mouth daily.   docusate sodium (COLACE) 100 MG capsule Take 1 capsule (100 mg total) by mouth 2 (two) times daily.   ezetimibe (ZETIA) 10 MG tablet Take 1 tablet (10 mg total) by mouth daily.   famotidine (PEPCID) 20 MG tablet One after supper   fluticasone (FLONASE) 50 MCG/ACT nasal spray Place 1 spray into both nostrils daily as needed for allergies or rhinitis.   hydrocortisone (ANUSOL-HC) 25 MG suppository Place 1 suppository (25 mg total) rectally at bedtime as needed for hemorrhoids or anal itching. For 5 days   hyoscyamine (LEVSIN SL) 0.125 MG SL tablet Place 1 tablet (0.125 mg total) under the tongue every 6 (six) hours as needed.   levocetirizine (XYZAL) 5 MG tablet Take 1 tablet (5 mg total) by mouth every evening. For allergies   Lido-PE-Mineral Oil-Petrolatum (RECTICARE ADVANCED) 5-0.25-17-39 % CREA Use three times daily as needed   Lidocaine, Anorectal, (RECTICARE) 5 % CREA Apply 1 Application topically  as needed.   linaclotide (LINZESS) 145 MCG CAPS capsule TAKE 1 CAPSULE BY MOUTH EVERY MORNING BEFORE BREAKFAST   nitroGLYCERIN (NITROSTAT) 0.4 MG SL tablet Place 1 tablet (0.4 mg total) under the tongue every 5 (five) minutes as needed for chest pain.   ondansetron (ZOFRAN) 4 MG tablet Take 1 tablet (4 mg total) by mouth every 6 (six) hours as needed for nausea or vomiting. For nausea and vomiting   ondansetron (ZOFRAN-ODT) 8  MG disintegrating tablet Take 8 mg by mouth every 8 (eight) hours as needed.   pantoprazole (PROTONIX) 40 MG tablet TAKE 1 TABLET BY MOUTH EVERY MORNING BEFORE BREAKFAST   pravastatin (PRAVACHOL) 10 MG tablet Take 1 tablet (10 mg total) by mouth 3 (three) times a week. Take on Mondays, Wednesdays, and Fridays.   psyllium (METAMUCIL) 58.6 % packet Take 1 packet by mouth every evening.   senna-docusate (SENOKOT S) 8.6-50 MG tablet Take 1-2 tablets by mouth at bedtime.   Simethicone (PHAZYME MAXIMUM STRENGTH) 250 MG CAPS Take 250 mg by mouth 3 (three) times daily. (Patient taking differently: Take 250 mg by mouth 3 (three) times daily as needed (gas relief).)   sucralfate (CARAFATE) 1 GM/10ML suspension Take 10 mLs (1 g total) by mouth 3 (three) times daily.   valACYclovir (VALTREX) 1000 MG tablet TAKE 1 TABLET (1,000 MG TOTAL) BY MOUTH AT BEDTIME.   [DISCONTINUED] AMBULATORY NON FORMULARY MEDICATION Medication Name: Domperidone 10 mg Take 1 tablet at bedtime daily. (Patient taking differently: Take 1 tablet by mouth at bedtime. Medication Name: Domperidone 10 mg Take 1 tablet at bedtime daily.)   [DISCONTINUED] cefdinir (OMNICEF) 300 MG capsule Take 300 mg by mouth 2 (two) times daily.   No facility-administered encounter medications on file as of 02/03/2023.   Allergies  Allergen Reactions   Amoxicillin-Pot Clavulanate Nausea And Vomiting   Chlordiazepoxide-Clidinium Itching    (Librax)   Ciprofloxacin Itching    Irritates stomach   Flagyl [Metronidazole]     Severe yeast infection   Sulfa Drugs Cross Reactors Nausea Only   Nitrofurantoin Monohyd Macro Rash   Omnicef [Cefdinir] Nausea And Vomiting   Patient Active Problem List   Diagnosis Date Noted   Anxiety about health 01/19/2023   Diverticular disease of colon 01/19/2023   Essential hypertension 01/19/2023   Fatigue 01/19/2023   Hyperlipidemia, unspecified 01/19/2023   Major depressive disorder, single episode, unspecified  01/19/2023   Mixed anxiety and depressive disorder 01/19/2023   Osteoporosis 01/19/2023   Other specified disorders of nose and nasal sinuses 01/19/2023   Streptococcal pharyngitis 01/19/2023   Unspecified osteoarthritis, unspecified site 01/19/2023   Annular tear of lumbar disc 10/28/2022   Anemia, iron deficiency 10/26/2022   Gallstones 08/18/2021   CAD (coronary artery disease) 07/22/2021   COPD GOLD ? / group B 05/12/2021   Exertional chest pain 05/12/2021   Cigarette smoker 05/12/2021   Generalized abdominal pain 01/02/2021   History of shingles 04/24/2019   Chronic midline low back pain without sciatica 02/27/2019   Chronic idiopathic constipation 04/26/2018   Aortic atherosclerosis (HCC) 03/01/2017   Rib fracture 12/07/2016   Gas pain 03/09/2016   Anal itching 03/09/2016   Elevated LDL cholesterol level 10/24/2015   Internal hemorrhoids 05/14/2014   GERD (gastroesophageal reflux disease) 09/17/2011   IBS (irritable bowel syndrome) 08/25/2010   Gastroparesis 06/30/2010   Non-ulcer dyspepsia 04/03/2010   Social History   Socioeconomic History   Marital status: Married    Spouse name: Not on file   Number  of children: 0   Years of education: Not on file   Highest education level: Not on file  Occupational History   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    Employer: [employer]  Tobacco Use   Smoking status: Some Days    Current packs/day: 0.00    Average packs/day: 1 pack/day for 20.0 years (20.0 ttl pk-yrs)    Types: Cigarettes    Start date: 05/2000    Last attempt to quit: 05/2020    Years since quitting: 2.7   Smokeless tobacco: Never   Tobacco comments:    USE SMOKELESS CIGARETTES  Vaping Use   Vaping status: Some Days   Substances: Nicotine  Substance and Sexual Activity   Alcohol use: Not Currently    Comment: 1 a week, wine   Drug use: No   Sexual activity: Not Currently  Other Topics Concern   Not on file   Social History Narrative   Not on file   Social Drivers of Health   Financial Resource Strain: Not on file  Food Insecurity: Not on file  Transportation Needs: Not on file  Physical Activity: Not on file  Stress: Not on file  Social Connections: Not on file  Intimate Partner Violence: Not on file    Ms. Gau's family history includes Diabetes in her father; Heart attack in her father; Hypertension in her mother.      Objective:    Vitals:   02/03/23 1322  BP: 110/62  Pulse: 98    Physical Exam Well-developed well-nourished older WF in no acute distress.  Height, Weight 105 , BMI 21.21  HEENT; nontraumatic normocephalic, EOMI, PE R LA, sclera anicteric. Oropharynx;not examined Neck; supple, no JVD Cardiovascular; regular rate and rhythm with S1-S2, no murmur rub or gallop Pulmonary; Clear bilaterally Abdomen; soft, nontender, nondistended, no palpable mass or hepatosplenomegaly, bowel sounds are active- there is tenderness over the right costal margin Rectal;not done today Skin; benign exam, no jaundice rash or appreciable lesions Extremities; no clubbing cyanosis or edema skin warm and dry Neuro/Psych; alert and oriented x4, grossly nonfocal mood and affect appropriate        Assessment & Plan:   #15 68 year old white female with history of IBS/constipation predominant, chronic GERD and chronic dyspepsia who comes in today for follow-up and with new complaint of nausea and belching over the past couple of weeks, and several episodes of watery" diarrhea per day which has been malodorous  She has taken 3 recent courses of antibiotics for UTI, sinus infection and strep pharyngitis. I suspect she has exacerbated IBS symptoms due to antibiotics.  Will need to rule out C. difficile colitis.  #2 chronic GERD stable #3.  Chronic epigastric/right upper quadrant discomfort, today has definite tenderness along the right costal margin more consistent with a musculoskeletal  component  #4 colon cancer screening-up-to-date with negative colonoscopy December 2022 #5 iron deficiency anemia, most recent hemoglobin normal at 14.9 and iron stores repleted after 2 doses of Feraheme in October November 2024. #6 COPD #7.  History of gastroparesis #8.  Hypertension #9 anxiety/depression  Plan; continue Protonix 40 mg p.o. every morning and famotidine 20 mg p.o. every afternoon Advised patient to stop Linzess until diarrhea improves or at the very least decrease to 72 mcg/day.  She was given samples of 72 mcg Linzess. She expressed today that she still feels like she has gastroparesis symptoms with fullness and a sensation of just not digesting.  We  discussed a trial of Motegrity which could be beneficial for both gastro paretic type symptoms and constipation.  Will send a prescription for Motegrity 2 mg p.o. daily, this may require prior authorization.  This would be in place of Linzess  Will check stool for C. difficile and GI path panel Advised starting align or Florastor x 1 month Start trial of FD guard 2 p.o. twice daily Refill Zofran 4 mg every 6 hours as needed for nausea Will arrange follow-up pending results of pending stool studies.  Ashliegh Parekh Oswald Hillock PA-C 02/03/2023   Cc: Rebekah Chesterfield, NP

## 2023-02-03 NOTE — Progress Notes (Signed)
Noted

## 2023-02-04 ENCOUNTER — Other Ambulatory Visit: Payer: Self-pay | Admitting: Physician Assistant

## 2023-02-04 DIAGNOSIS — R140 Abdominal distension (gaseous): Secondary | ICD-10-CM | POA: Diagnosis not present

## 2023-02-04 DIAGNOSIS — K588 Other irritable bowel syndrome: Secondary | ICD-10-CM | POA: Diagnosis not present

## 2023-02-04 DIAGNOSIS — R112 Nausea with vomiting, unspecified: Secondary | ICD-10-CM | POA: Diagnosis not present

## 2023-02-08 DIAGNOSIS — R350 Frequency of micturition: Secondary | ICD-10-CM | POA: Diagnosis not present

## 2023-02-08 DIAGNOSIS — B3731 Acute candidiasis of vulva and vagina: Secondary | ICD-10-CM | POA: Diagnosis not present

## 2023-02-08 DIAGNOSIS — N3001 Acute cystitis with hematuria: Secondary | ICD-10-CM | POA: Diagnosis not present

## 2023-02-08 LAB — GI PROFILE, STOOL, PCR

## 2023-02-08 LAB — CLOSTRIDIUM DIFFICILE BY PCR: Toxigenic C. Difficile by PCR: NEGATIVE

## 2023-02-09 DIAGNOSIS — N3001 Acute cystitis with hematuria: Secondary | ICD-10-CM | POA: Diagnosis not present

## 2023-02-10 ENCOUNTER — Ambulatory Visit (HOSPITAL_COMMUNITY): Admission: RE | Admit: 2023-02-10 | Payer: Medicare Other | Source: Ambulatory Visit

## 2023-02-10 DIAGNOSIS — N3001 Acute cystitis with hematuria: Secondary | ICD-10-CM | POA: Diagnosis not present

## 2023-02-14 DIAGNOSIS — R070 Pain in throat: Secondary | ICD-10-CM | POA: Diagnosis not present

## 2023-02-14 DIAGNOSIS — Z20822 Contact with and (suspected) exposure to covid-19: Secondary | ICD-10-CM | POA: Diagnosis not present

## 2023-02-14 DIAGNOSIS — R350 Frequency of micturition: Secondary | ICD-10-CM | POA: Diagnosis not present

## 2023-02-14 DIAGNOSIS — J069 Acute upper respiratory infection, unspecified: Secondary | ICD-10-CM | POA: Diagnosis not present

## 2023-02-21 ENCOUNTER — Ambulatory Visit: Payer: Medicare Other | Admitting: Urology

## 2023-02-21 VITALS — BP 130/82 | HR 96 | Temp 98.0°F

## 2023-02-21 DIAGNOSIS — N952 Postmenopausal atrophic vaginitis: Secondary | ICD-10-CM | POA: Diagnosis not present

## 2023-02-21 DIAGNOSIS — R399 Unspecified symptoms and signs involving the genitourinary system: Secondary | ICD-10-CM

## 2023-02-21 DIAGNOSIS — N390 Urinary tract infection, site not specified: Secondary | ICD-10-CM

## 2023-02-21 DIAGNOSIS — Z87440 Personal history of urinary (tract) infections: Secondary | ICD-10-CM | POA: Diagnosis not present

## 2023-02-21 DIAGNOSIS — R300 Dysuria: Secondary | ICD-10-CM | POA: Diagnosis not present

## 2023-02-21 LAB — URINALYSIS, ROUTINE W REFLEX MICROSCOPIC
Bilirubin, UA: NEGATIVE
Glucose, UA: NEGATIVE
Ketones, UA: NEGATIVE
Leukocytes,UA: NEGATIVE
Nitrite, UA: NEGATIVE
Protein,UA: NEGATIVE
RBC, UA: NEGATIVE
Specific Gravity, UA: 1.03 (ref 1.005–1.030)
Urobilinogen, Ur: 0.2 mg/dL (ref 0.2–1.0)
pH, UA: 6 (ref 5.0–7.5)

## 2023-02-21 MED ORDER — ESTRADIOL 0.1 MG/GM VA CREA: TOPICAL_CREAM | VAGINAL | 3 refills | Status: AC

## 2023-02-21 NOTE — Progress Notes (Signed)
Name: Ruth Terry DOB: 10/13/1955 MRN: [id number]  History of Present Illness: Ruth Terry is a 68 y.o. female who presents today for follow up visit at [facility]. - GU history: 1. Recurrent UTI. 2. Voiding dysfunction. - Was previously taking Uroxatrol (Alfuzosin) 10 mg daily. - Possibly related to history of gastroparesis, IBS with chronic constipation, hemorrhoids.  At last visit with [place], PA on 09/02/2021: - Discussed constipation management; advised to follow up with her gastroenterologist.  - PVR = 0 ml. - The plan was "Follow-up here in 6 months for UA and PVR."  Urine culture results in past 12 months: - 02/24/2022: Negative - 05/20/2022: Negative - 11/01/2022: Positive for E. Coli; treated with Keflex 500 mg 4x/day for 1 week. - 02/08/2023: Positive for Proteus vulgaris; treated with IM Rocephin 3 days consecutively. - 02/14/2023: Positive for Enterococcus faecalis; no antibiotic prescribed.  Relevant imaging: - 06/04/2022: CT abdomen/pelvis w/ contrast showed no GU stones, masses, or hydronephrosis; bladder unremarkable.  Today: She reports that since October 2024 she has had persistent recurrent UTI symptoms including dysuria, increased urinary urgency & frequency, headaches, weakness, mild nausea, peri-umbilical abdominal pain, and bilateral groin pain. She states she has those symptoms currently. Reports intermittent chills. Denies flank pain, fevers, gross hematuria, straining to void, or sensations of incomplete emptying.   Fall Screening: Do you usually have a device to assist in your mobility? No   Medications: Current Outpatient Medications  Medication Sig Dispense Refill   aspirin EC 81 MG tablet Take 1 tablet (81 mg total) by mouth daily. Swallow whole. 90 tablet 3   Cholecalciferol (VITAMIN D3 PO) Take 1 tablet by mouth daily.     docusate sodium (COLACE) 100 MG capsule Take 1 capsule (100 mg total) by mouth 2 (two) times  daily. 20 capsule 0   estradiol (ESTRACE) 0.1 MG/GM vaginal cream Discard plastic applicator. Insert a blueberry size amount (approximately 1 gram) of cream on fingertip inside vagina at bedtime every night for 1 week then every other night. For long term use. 30 g 3   ezetimibe (ZETIA) 10 MG tablet Take 1 tablet (10 mg total) by mouth daily. 90 tablet 3   famotidine (PEPCID) 20 MG tablet One after supper     fluticasone (FLONASE) 50 MCG/ACT nasal spray Place 1 spray into both nostrils daily as needed for allergies or rhinitis.     hydrocortisone (ANUSOL-HC) 25 MG suppository Place 1 suppository (25 mg total) rectally at bedtime as needed for hemorrhoids or anal itching. For 5 days 5 suppository 1   hyoscyamine (LEVSIN SL) 0.125 MG SL tablet Place 1 tablet (0.125 mg total) under the tongue every 6 (six) hours as needed. 40 tablet 0   levocetirizine (XYZAL) 5 MG tablet Take 1 tablet (5 mg total) by mouth every evening. For allergies 90 tablet 3   Lido-PE-Mineral Oil-Petrolatum (RECTICARE ADVANCED) 5-0.25-17-39 % CREA Use three times daily as needed     Lidocaine, Anorectal, (RECTICARE) 5 % CREA Apply 1 Application topically as needed. 28 g 0   linaclotide (LINZESS) 145 MCG CAPS capsule TAKE 1 CAPSULE BY MOUTH EVERY MORNING BEFORE BREAKFAST 90 capsule 2   nitroGLYCERIN (NITROSTAT) 0.4 MG SL tablet Place 1 tablet (0.4 mg total) under the tongue every 5 (five) minutes as needed for chest pain. 25 tablet 3   ondansetron (ZOFRAN) 4 MG tablet Take 1 tablet (4 mg total) by mouth every 6 (six) hours as needed for nausea or vomiting. For nausea  and vomiting 30 tablet 2   ondansetron (ZOFRAN-ODT) 8 MG disintegrating tablet Take 8 mg by mouth every 8 (eight) hours as needed.     pantoprazole (PROTONIX) 40 MG tablet TAKE 1 TABLET BY MOUTH EVERY MORNING BEFORE BREAKFAST 90 tablet 2   pravastatin (PRAVACHOL) 10 MG tablet Take 1 tablet (10 mg total) by mouth 3 (three) times a week. Take on Mondays, Wednesdays, and  Fridays. 35 tablet 3   psyllium (METAMUCIL) 58.6 % packet Take 1 packet by mouth every evening.     senna-docusate (SENOKOT S) 8.6-50 MG tablet Take 1-2 tablets by mouth at bedtime.     Simethicone (PHAZYME MAXIMUM STRENGTH) 250 MG CAPS Take 250 mg by mouth 3 (three) times daily. (Patient taking differently: Take 250 mg by mouth 3 (three) times daily as needed (gas relief).) 14 capsule    sucralfate (CARAFATE) 1 GM/10ML suspension Take 10 mLs (1 g total) by mouth 3 (three) times daily. 1200 mL 0   valACYclovir (VALTREX) 1000 MG tablet TAKE 1 TABLET (1,000 MG TOTAL) BY MOUTH AT BEDTIME. 90 tablet 0   No current facility-administered medications for this visit.    Allergies: Allergies  Allergen Reactions   Amoxicillin-Pot Clavulanate Nausea And Vomiting   Chlordiazepoxide-Clidinium Itching    (Librax)   Ciprofloxacin Itching    Irritates stomach   Flagyl [Metronidazole]     Severe yeast infection   Sulfa Drugs Cross Reactors Nausea Only   Nitrofurantoin Monohyd Macro Rash   Omnicef [Cefdinir] Nausea And Vomiting    Past Medical History:  Diagnosis Date   Allergy    Anal fissure    Anemia, iron deficiency 10/26/2022   Cataract    Chronic constipation    Coronary artery disease    Diverticulosis 04/20/2010   colonoscopy   Gastroparesis    GERD (gastroesophageal reflux disease)    Hemorrhoids    Hemorrhoids    Hyperlipidemia    IBS (irritable bowel syndrome)    MVP (mitral valve prolapse)    as a teenager per patient, no issues since   Osteoporosis    Polyp, stomach 04/20/2010   egd   Shingles    UTI (lower urinary tract infection)    Past Surgical History:  Procedure Laterality Date   CHOLECYSTECTOMY N/A 09/21/2021   Procedure: LAPAROSCOPIC CHOLECYSTECTOMY;  Surgeon: Fritzi Mandes, MD;  Location: [facility] OR;  Service: General;  Laterality: N/A;   COLONOSCOPY     CORONARY PRESSURE/FFR STUDY N/A 07/22/2021   Procedure: INTRAVASCULAR PRESSURE WIRE/FFR STUDY;  Surgeon:  Lyn Records, MD;  Location: [facility] INVASIVE CV LAB;  Service: Cardiovascular;  Laterality: N/A;   ESOPHAGOGASTRODUODENOSCOPY     LEFT HEART CATH AND CORONARY ANGIOGRAPHY N/A 07/22/2021   Procedure: LEFT HEART CATH AND CORONARY ANGIOGRAPHY;  Surgeon: Lyn Records, MD;  Location: [facility] INVASIVE CV LAB;  Service: Cardiovascular;  Laterality: N/A;   Family History  Problem Relation Age of Onset   Hypertension Mother    Diabetes Father    Heart attack Father        Age 2   Colon cancer Neg Hx    Esophageal cancer Neg Hx    Rectal cancer Neg Hx    Social History   Socioeconomic History   Marital status: Married    Spouse name: Not on file   Number of children: 0   Years of education: Not on file   Highest education level: Not on file  Occupational History   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, 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    Employer: [employer]  Tobacco Use   Smoking status: Some Days    Current packs/day: 0.00    Average packs/day: 1 pack/day for 20.0 years (20.0 ttl pk-yrs)    Types: Cigarettes    Start date: 05/2000    Last attempt to quit: 05/2020    Years since quitting: 2.8   Smokeless tobacco: Never   Tobacco comments:    USE SMOKELESS CIGARETTES  Vaping Use   Vaping status: Some Days   Substances: Nicotine  Substance and Sexual Activity   Alcohol use: Not Currently    Comment: 1 a week, wine   Drug use: No   Sexual activity: Not Currently  Other Topics Concern   Not on file  Social History Narrative   Not on file   Social Drivers of Health   Financial Resource Strain: Not on file  Food Insecurity: Not on file  Transportation Needs: Not on file  Physical Activity: Not on file  Stress: Not on file  Social Connections: Not on file  Intimate Partner Violence: Not on file    Review of Systems Constitutional: Patient denies any unintentional weight loss or change in strength lntegumentary: Patient denies any rashes or pruritus Cardiovascular:  Patient denies chest pain or syncope Respiratory: Patient denies shortness of breath Gastrointestinal: As per HPI  Musculoskeletal: Patient denies muscle cramps or weakness Neurologic: Patient denies convulsions or seizures Allergic/Immunologic: Patient denies recent allergic reaction(s) Hematologic/Lymphatic: Patient denies bleeding tendencies Endocrine: Patient denies heat/cold intolerance  GU: As per HPI.  OBJECTIVE Vitals:   02/21/23 1012  BP: 130/82  Pulse: 96  Temp: 98 F (36.7 C)   There is no height or weight on file to calculate BMI.  Physical Examination Constitutional: No obvious distress; patient is non-toxic appearing  Cardiovascular: No visible lower extremity edema.  Respiratory: The patient does not have audible wheezing/stridor; respirations do not appear labored  Gastrointestinal: Abdomen non-distended Musculoskeletal: Normal ROM of UEs  Skin: No obvious rashes/open sores  Neurologic: CN 2-12 grossly intact Psychiatric: Answered questions appropriately with normal affect  Hematologic/Lymphatic/Immunologic: No obvious bruises or sites of spontaneous bleeding  UA: negative  PVR: 0 ml  ASSESSMENT Recurrent UTI - Plan: Urinalysis, Routine w reflex microscopic, BLADDER SCAN AMB NON-IMAGING, US RENAL, estradiol (ESTRACE) 0.1 MG/GM vaginal cream  UTI symptoms - Plan: Urinalysis, Routine w reflex microscopic, BLADDER SCAN AMB NON-IMAGING, US RENAL  Atrophic vaginitis - Plan: estradiol (ESTRACE) 0.1 MG/GM vaginal cream  We reviewed history in detail. Unclear etiology for current symptoms given normal UA today. We agreed to send urine for MDX culture for further evaluation. Discussed the possibility that her urinary symptoms may be attributable to non-infectious etiologies such as OAB, vaginal atrophy, etc.   We agreed to hold off on empiric antibiotic treatment while awaiting culture results and sensitivities. Discussed antibiotic stewardship and goal to minimize  risk for developing antibiotic resistance, particularly due to her multiple antibiotic allergies.  We discussed the possible etiologies of recurrent UTls including ascending infection related to intercourse; vaginal atrophy; transmural infection that has been treated incompletely; urinary tract stones; incomplete bladder emptying with urinary stasis; kidney or bladder tumor; urethral diverticulum; and colonization of  vagina and urinary tract with pathologic, adherent organisms.   We reviewed UTI symptoms which may include: dysuria, acutely increased urinary urgency and/or frequency, fever, acute mental status change / confusion, general malaise, fatigue, weakness.  For UTI prevention we discussed the following options, for which  detailed information has been included in Patient Information section of today's After Visit Summary. > Maintain adequate fluid intake daily to flush out the urinary tract. > Go to the bathroom to urinate every 4-6 hours while awake to minimize urinary stasis / bacterial overgrowth in the bladder. > Proanthocyanidin (PAC) supplement 36 mg daily; must be soluble (insoluble form of PAC will be ineffective). Recommend Ellura.  > D-mannose 2 g daily to minimize bacterial adherence in urinary tract > Vitamin C supplement to acidify urine to suppress bacterial growth > Probiotic to maintain healthy vaginal microbiome > Topical vaginal estrogen for vaginal atrophy.  We discussed the following: - The etiology and consequences of urogenital epithelial atrophy was explained to patient. The thinning of the epithelium of the urethra can contribute to urinary urgency and frequency syndromes. In addition, the normal bacterial flora that colonizes the perineum may contribute to UTI risk because the thin urethral epithelium allows the bacteria to become adherent and the change in vaginal pH can disrupt the vaginal / urethral microbiome and allow for bacterial overgrowth. - The normal  bacterial flora that colonizes the perineum may contribute to UTI risk because the thin urethral epithelium allows the bacteria to become adherent and the change in vaginal pH can disrupt the vaginal / urethral microbiome and allow for bacterial overgrowth.  - Topical vaginal estrogen replacement will take about 3 months to restore the vaginal pH. - Topical vaginal replacement may sting/burn initially due to severe dryness, which will improve with ongoing treatment as the tissue gets healthier.  - OK to have sex with any of the topical vaginal estrogen replacement options.  - There have been studies that evaluate use of low-dose intravaginal estrogen cream that shows minimal systemic absorption that is negligible after 3 weeks. There have been no studies indicating that use of topical vaginal estrogen increases a patient's risk of contributing to breast cancer development or recurrence.  For management of acute UTI symptoms: - Discussed option to take over-the-counter Pyridium (commonly known under the AZO brand name) for bladder pain. No more than 3 days at a time.  We discussed the role of diagnostic testing for further evaluation and investigation of potential infectious nidus such as: cystoscopy - RUS  Patient ultimately decided to start topical vaginal estrogen cream and proceed with RUS.   Will plan for follow up as previously scheduled with Dr. Ronne Binning on 03/04/2023 or sooner if needed. Pt verbalized understanding and agreement. All questions were answered.   PLAN Advised the following: 1. MDX urine culture. 2. RUS.  3. Start topical vaginal estrogen cream as prescribed. 4. Maintain adequate fluid intake daily to flush out the urinary tract. 5. Consider OTC supplements for UTI prevention. 6. Return in 11 days (on 03/04/2023) for f/u with Dr. Ronne Binning as previously scheduled.  Orders Placed This Encounter  Procedures   US RENAL    Standing Status:   Future    Expected Date:    02/21/2023    Expiration Date:   02/21/2024    Reason for Exam (SYMPTOM  OR DIAGNOSIS REQUIRED):   kidney stone known or suspected    Preferred imaging location?:   [facility]   Urinalysis, Routine w reflex microscopic   BLADDER SCAN AMB NON-IMAGING   Total time spent caring for the patient today was over 30 minutes. This includes time spent on the date of the visit reviewing the patient's chart before the visit, time spent during the visit, and time spent after the visit  on documentation. Over 50% of that time was spent in face-to-face time with this patient for direct counseling. E&M based on time and complexity of medical decision making.  It has been explained that the patient is to follow regularly with their PCP in addition to all other providers involved in their care and to follow instructions provided by these respective offices. Patient advised to contact urology clinic if any urologic-pertaining questions, concerns, new symptoms or problems arise in the interim period.  Patient Instructions  UTI prevention / management:  Difference between Urinalysis (urine dipstick test) and Urine culture:  Urinalysis (urine dipstick test): A quick office test used as an indicator to determine whether or not further testing is necessary (such as a urine culture, urine microscopy, etc.) The urinalysis cannot differentiate a true bacterial UTI or give a definitive diagnosis for the findings.  Urine culture: May be performed based on the findings of a urinalysis to evaluate for UTI. Grows out on a petri dish for 48-72 hours. Provides important information about: whether or not bacterial growth is present and if so: what the predominant bacteria is which antibiotics will work best against that bacteria That information is important so that we can diagnose and treat patients appropriately as there are other conditions which may mimic UTls which must not be missed (such as cancer, interstitial  cystitis, stones, etc.). Assists [place] with antibiotic stewardship to minimize patient's risk for developing antibiotic resistance (getting to a point where no antibiotics work anymore).  Options when UTI symptoms occur: 1. Call [facility] [place] and request to speak with triage nurse (phone # [phone], select option 3). In accordance with clinic guidelines the nurse will determine next steps based on patient-reported symptoms, which may include: same-day lab visit to provide urine specimen, recommendation to schedule Urology office visit appointment for further evaluation, recommendation to proceed to ER, etc. 2. Call your Primary Care Provider (PCP) office to request urgent / same-day visit. Be sure to request for urine culture to be ordered and have results faxed to Urology (fax # [phone]).  3. Go to urgent care. Be sure to request for urine culture to be ordered and have results faxed to Urology (fax # [phone]).   For bladder pain/ burning with urination: - Can take over-the-counter Pyridium (phenazopyridine; commonly known under the "AZO" brand) for a few days as needed. Limit use to no more than 3 days consecutively due to risk for methemoglobinemia, liver function issues, and bone health damage with long term use of Pyridium. - Alternative: Prescription urinary analgesics (such as Uribel, Urogesic blue, Urelle, Uro-MP). Often expensive / poorly covered by insurance unfortunately.  Options / recommendations for UTI prevention: - Topical vaginal estrogen for vaginal atrophy (aka Genitourinary Syndrome of Menopause (GSM)). - Adequate daily fluid intake to flush out the urinary tract. - Go to the bathroom to urinate every 4-6 hours while awake to minimize urinary stasis / bacterial overgrowth in the bladder. - Proanthocyanidin (PAC) supplement 36 mg daily; must be soluble (insoluble form of PAC will be ineffective). Recommended brand: Ellura. This is an over-the-counter  supplement (often must be found/ purchased online) supplement derived from cranberries with concentrated active component: Proanthocyanidin (PAC) 36 mg daily. Decreases bacterial adherence to bladder lining.  - D-mannose powder (2 grams daily). This is an over-the-counter supplement which decreases bacterial adherence to bladder lining (it is a sugar that inhibits bacterial adherence to urothelial cells by binding to the pili of enteric bacteria). Take as per manufacturer recommendation.  Can be used as an alternative or in addition to the concentrated cranberry supplement.  - Vitamin C supplement to acidify urine to minimize bacterial growth.  - Probiotic to maintain healthy vaginal microbiome to suppress bacteria at urethral opening. Brand recommendations: Darrold Junker (includes probiotic & D-mannose ), Feminine Balance (highest concentration of lactobacillus) or Hyperbiotic Pro 15.  Note for patients with diabetes:  - Be aware that D-mannose contains sugar.  Note for patients with interstitial cystitis (IC):  - Patients with IC should typically avoid cranberry/ PAC supplements and Vitamin C supplements due to their acidity, which may exacerbate IC-related bladder pain. - Symptoms of true bacterial UTI can overlap / mimic symptoms of an IC flare up. Antibiotic use is NOT indicated for IC flare ups. Urine culture needed prior to antibiotic treatment for IC patients. The goal is to minimize your risk for developing antibiotic-resistant bacteria.    Vaginal atrophy I Genitourinary syndrome of menopause (GSM):  What it is: Changes in the vaginal environment (including the vulva and urethra) including: Thinning of the epithelium (skin/ mucosa surface) Can contribute to urinary urgency and frequency Can contribute to dryness, itching, irritation of the vulvar and vaginal tissue Can contribute to pain with intercourse Can contribute to physical changes of the labia, vulva, and vagina such as: Narrowing of  the vaginal opening Decreased vaginal length Loss of labial architecture Labial adhesions Pale color of vulvovaginal tissue  Loss of pubic hair Allows bacteria to become adherent  Results in increased risk for urinary tract infection (UTI) due to bacterial overgrowth and migration up the urethra into the bladder Change in vaginal pH (acid/ base balance) Allows for alteration / disruption of the normal bacterial flora / microbiome Results in increased risk for urinary tract infection (UTI) due to bacterial overgrowth  Treatment options: Over-the-counter lubricants (see list below). Prescription vaginal estrogen replacement. Options: Topical vaginal estrogen cream Estrace, Premarin, or compounded estradiol cream/ gel We advise: Discard plastic applicator as that tends to use more medication than you need, which is not harmful but wastes / uses up the medication. Also the plastic applicator may cause discomfort. Insert blueberry size amount of medication via the tip of your finger inside vagina nightly for 1 week then 2-3 times per week (long term). Estring vaginal ring Exchanged every 3 months (either at home or in office by provider) Vagifem vaginal tablet Inserted nightly for 2 weeks then twice a week (long term) lntrarosa vaginal suppository Vaginal DHEA: converts to estrogen in vaginal tissue without systemic effect Inserted nightly (long term) Vaginal laser therapy (Mona Lisa touch) Performed in 3 treatments each 6 weeks apart (available in our [place] office). Can feel like a sunburn for 3-4 days after each treatment until new skin heals in. Usually not covered by insurance. Estimated cost is $1500 for all 3 sessions.  FYI regarding prescription vaginal estrogen treatment options: All topical vaginal estrogen replacement options are equivalent in terms of efficacy. Topical vaginal estrogen replacement will take about 3 months to be effective. OK to have sex with any of the  topical vaginal estrogen replacement options. Topical vaginal estrogen replacement may sting/burn initially due to severe dryness, which will improve with ongoing treatment. There have been studies that evaluate use of low-dose intravaginal estrogen that show minimal systemic absorption which is negligible after 3 weeks. There have been no studies indicating increased risk of contributing to cancer development or recurrence.  Topical vaginal estrogen cream safe to use with breast cancer history [email or web address]  Topical vaginal estrogen  cream safe to use with blood clot history [email or web address]   Lubricants and Moisturizers for Treating Genitourinary Syndrome of Menopause and Vulvovaginal Atrophy Treatment Comments I Available Products   Lubricants   Water-based Ingredients: Deionized water, glycerin, propylene glycol; latex safe; rare irritation; dry out with extended sexual activity Astroglide, Good Clean Love, K-Y Jelly, Natural, Organic, Pink, Sliquid, Sylk, Yes    Oil Based Ingredients: avocado, olive, peanut, corn; latex safe; can be used with silicone products; staining; safe (unless peanut allergy); non-irritating Coconut oil, vegetable oil, vitamin E oil  Silicone-Based Ingredients: Silicone polymers; staining; typically nonirritating, long lasting; waterproof; should not be used with silicone dilators, sexual toys, or gynecologic products Astroglide X, Oceanus Ultra Pure, Pink Silicone, Pjur Eros, Replens Silky Smooth, Silicone Premium JO, SKYN, Uberlube, [employer] Based Minimize harm to sperm motility; designed Astroglide TTC, Conceive Plus, Pre for couples trying to conceive Seed, Yes Baby  Fertility Friendly Minimize  harm to sperm motility; designed Astroglide, TTC, Conceive Plus, Pre for couples trying to conceive Seed, Yes Baby  Vaginal Moisturizers   Vaginal Moisturizers For maintenance use 1 to 3 times weekly; can benefit women with dryness, chafing with AOL, and recurrent vaginal infections irrespective of sexual activity timing Balance Active Menopause Vaginal Moisturizing Lubricant, Canesintima Intimate Moisturizer, Replens, Rephresh, Sylk Natural Intimate Moisturizer, Yes Vaginal Moisturizer  Hybrids Properties of both water and silicone-based products (combination of a vaginal lubricant and moisturizer); Non-irritating; good option for women with allergies and sensitivities Lubrigyn, Luvena  Suppositories Hyaluronic acid to retain moisture Revaree  Vulvar Soothing Creams/Oils    Medicated CreamsP ain and burn relief; Ingredients: 4% Lidocaine, Aloe Vera gel Releveum ([place]i)  Non-Medicated Creams For anti-itch and moisture/maintenance; Ingredients: Coconut oil, Avocado oil, Shea Butter, Olive oil, Vitamin E Vajuvenate, Vmagic  Oils !For moisture/maintenance !Coconut oil, Vitamin E oil, Emu oil     Electronically signed by:  Donnita Falls, FNP   02/21/23    12:16 PM

## 2023-02-21 NOTE — Patient Instructions (Signed)
 UTI prevention / management:  Difference between Urinalysis (urine dipstick test) and Urine culture:  Urinalysis (urine dipstick test): A quick office test used as an indicator to determine whether or not further testing is necessary (such as a urine culture, urine microscopy, etc.) The urinalysis cannot differentiate a true bacterial UTI or give a definitive diagnosis for the findings.  Urine culture: May be performed based on the findings of a urinalysis to evaluate for UTI. Grows out on a petri dish for 48-72 hours. Provides important information about: whether or not bacterial growth is present and if so: what the predominant bacteria is which antibiotics will work best against that bacteria That information is important so that we can diagnose and treat patients appropriately as there are other conditions which may mimic UTls which must not be missed (such as cancer, interstitial cystitis, stones, etc.). Assists [place] with antibiotic stewardship to minimize patient's risk for developing antibiotic resistance (getting to a point where no antibiotics work anymore).  Options when UTI symptoms occur: 1. Call [facility] [place] and request to speak with triage nurse (phone # [phone], select option 3). In accordance with clinic guidelines the nurse will determine next steps based on patient-reported symptoms, which may include: same-day lab visit to provide urine specimen, recommendation to schedule Urology office visit appointment for further evaluation, recommendation to proceed to ER, etc. 2. Call your Primary Care Provider (PCP) office to request urgent / same-day visit. Be sure to request for urine culture to be ordered and have results faxed to Urology (fax # [phone]).  3. Go to urgent care. Be sure to request for urine culture to be ordered and have results faxed to Urology (fax # [phone]).   For bladder pain/ burning with urination: - Can take over-the-counter  Pyridium (phenazopyridine; commonly known under the "AZO" brand) for a few days as needed. Limit use to no more than 3 days consecutively due to risk for methemoglobinemia, liver function issues, and bone health damage with long term use of Pyridium. - Alternative: Prescription urinary analgesics (such as Uribel, Urogesic blue, Urelle, Uro-MP). Often expensive / poorly covered by insurance unfortunately.  Options / recommendations for UTI prevention: - Topical vaginal estrogen for vaginal atrophy (aka Genitourinary Syndrome of Menopause (GSM)). - Adequate daily fluid intake to flush out the urinary tract. - Go to the bathroom to urinate every 4-6 hours while awake to minimize urinary stasis / bacterial overgrowth in the bladder. - Proanthocyanidin (PAC) supplement 36 mg daily; must be soluble (insoluble form of PAC will be ineffective). Recommended brand: Ellura. This is an over-the-counter supplement (often must be found/ purchased online) supplement derived from cranberries with concentrated active component: Proanthocyanidin (PAC) 36 mg daily. Decreases bacterial adherence to bladder lining.  - D-mannose powder (2 grams daily). This is an over-the-counter supplement which decreases bacterial adherence to bladder lining (it is a sugar that inhibits bacterial adherence to urothelial cells by binding to the pili of enteric bacteria). Take as per manufacturer recommendation. Can be used as an alternative or in addition to the concentrated cranberry supplement.  - Vitamin C supplement to acidify urine to minimize bacterial growth.  - Probiotic to maintain healthy vaginal microbiome to suppress bacteria at urethral opening. Brand recommendations: Darrold Junker (includes probiotic & D-mannose ), Feminine Balance (highest concentration of lactobacillus) or Hyperbiotic Pro 15.  Note for patients with diabetes:  - Be aware that D-mannose contains sugar.  Note for patients with interstitial cystitis (IC):  -  Patients with IC should  typically avoid cranberry/ PAC supplements and Vitamin C supplements due to their acidity, which may exacerbate IC-related bladder pain. - Symptoms of true bacterial UTI can overlap / mimic symptoms of an IC flare up. Antibiotic use is NOT indicated for IC flare ups. Urine culture needed prior to antibiotic treatment for IC patients. The goal is to minimize your risk for developing antibiotic-resistant bacteria.    Vaginal atrophy I Genitourinary syndrome of menopause (GSM):  What it is: Changes in the vaginal environment (including the vulva and urethra) including: Thinning of the epithelium (skin/ mucosa surface) Can contribute to urinary urgency and frequency Can contribute to dryness, itching, irritation of the vulvar and vaginal tissue Can contribute to pain with intercourse Can contribute to physical changes of the labia, vulva, and vagina such as: Narrowing of the vaginal opening Decreased vaginal length Loss of labial architecture Labial adhesions Pale color of vulvovaginal tissue Loss of pubic hair Allows bacteria to become adherent  Results in increased risk for urinary tract infection (UTI) due to bacterial overgrowth and migration up the urethra into the bladder Change in vaginal pH (acid/ base balance) Allows for alteration / disruption of the normal bacterial flora / microbiome Results in increased risk for urinary tract infection (UTI) due to bacterial overgrowth  Treatment options: Over-the-counter lubricants (see list below). Prescription vaginal estrogen replacement. Options: Topical vaginal estrogen cream Estrace, Premarin, or compounded estradiol cream/ gel We advise: Discard plastic applicator as that tends to use more medication than you need, which is not harmful but wastes / uses up the medication. Also the plastic applicator may cause discomfort. Insert blueberry size amount of medication via the tip of your finger inside vagina  nightly for 1 week then 2-3 times per week (long term). Estring vaginal ring Exchanged every 3 months (either at home or in office by provider) Vagifem vaginal tablet Inserted nightly for 2 weeks then twice a week (long term) lntrarosa vaginal suppository Vaginal DHEA: converts to estrogen in vaginal tissue without systemic effect Inserted nightly (long term) Vaginal laser therapy (Mona Lisa touch) Performed in 3 treatments each 6 weeks apart (available in our [place] office). Can feel like a sunburn for 3-4 days after each treatment until new skin heals in. Usually not covered by insurance. Estimated cost is $1500 for all 3 sessions.  FYI regarding prescription vaginal estrogen treatment options: All topical vaginal estrogen replacement options are equivalent in terms of efficacy. Topical vaginal estrogen replacement will take about 3 months to be effective. OK to have sex with any of the topical vaginal estrogen replacement options. Topical vaginal estrogen replacement may sting/burn initially due to severe dryness, which will improve with ongoing treatment. There have been studies that evaluate use of low-dose intravaginal estrogen that show minimal systemic absorption which is negligible after 3 weeks. There have been no studies indicating increased risk of contributing to cancer development or recurrence.  Topical vaginal estrogen cream safe to use with breast cancer history [email or web address]  Topical vaginal estrogen cream safe to use with blood clot history [email or web address]   Lubricants and Moisturizers for Treating Genitourinary Syndrome of Menopause and Vulvovaginal Atrophy Treatment Comments I Available Products   Lubricants    Water-based Ingredients: Deionized water, glycerin, propylene glycol; latex safe; rare irritation; dry out with extended sexual activity Astroglide, Good Clean Love, K-Y Jelly, Natural, Organic, Pink, Sliquid, Sylk, Yes    Oil Based Ingredients: avocado, olive, peanut, corn; latex safe; can be used with silicone products; staining; safe (unless peanut allergy); non-irritating Coconut  oil, vegetable oil, vitamin E oil  Silicone-Based Ingredients: Silicone polymers; staining; typically nonirritating, long lasting; waterproof; should not be used with silicone dilators, sexual toys, or gynecologic products Astroglide X, Oceanus Ultra Pure, Pink Silicone, Pjur Eros, Replens Silky Smooth, Silicone Premium JO, SKYN, Uberlube, [employer] Based Minimize harm to sperm motility; designed Astroglide TTC, Conceive Plus, Pre for couples trying to conceive Seed, Yes Baby  Fertility Friendly Minimize harm to sperm motility; designed Astroglide, TTC, Conceive Plus, Pre for couples trying to conceive Seed, Yes Baby  Vaginal Moisturizers   Vaginal Moisturizers For maintenance use 1 to 3 times weekly; can benefit women with dryness, chafing with AOL, and recurrent vaginal infections irrespective of sexual activity timing Balance Active Menopause Vaginal Moisturizing Lubricant, Canesintima Intimate Moisturizer, Replens, Rephresh, Sylk Natural Intimate Moisturizer, Yes Vaginal Moisturizer  Hybrids Properties of both water and silicone-based products (combination of a vaginal lubricant and moisturizer); Non-irritating; good option for women with allergies and sensitivities Lubrigyn, Luvena  Suppositories Hyaluronic acid to retain moisture Revaree  Vulvar Soothing Creams/Oils    Medicated CreamsP ain and burn relief; Ingredients: 4% Lidocaine, Aloe Vera gel Releveum (Desert Wheaton)  Non-Medicated Creams For anti-itch and moisture/maintenance; Ingredients: Coconut oil, Avocado oil, Shea Butter,  Olive oil, Vitamin E Vajuvenate, Vmagic  Oils !For moisture/maintenance !Coconut oil, Vitamin E oil, Emu oil

## 2023-02-22 NOTE — Progress Notes (Signed)
MDX- Confirmation [id number] 02/21/2023

## 2023-02-28 ENCOUNTER — Ambulatory Visit: Payer: Medicare Other | Admitting: Urology

## 2023-03-01 ENCOUNTER — Ambulatory Visit (HOSPITAL_COMMUNITY)
Admission: RE | Admit: 2023-03-01 | Discharge: 2023-03-01 | Disposition: A | Discharge status: Home / Self Care | Payer: Medicare Other | Source: Ambulatory Visit | Attending: Urology | Admitting: Urology

## 2023-03-01 ENCOUNTER — Telehealth: Payer: Self-pay

## 2023-03-01 DIAGNOSIS — R399 Unspecified symptoms and signs involving the genitourinary system: Secondary | ICD-10-CM | POA: Insufficient documentation

## 2023-03-01 DIAGNOSIS — N390 Urinary tract infection, site not specified: Secondary | ICD-10-CM | POA: Insufficient documentation

## 2023-03-01 NOTE — Telephone Encounter (Signed)
-----   Message from Donnita Falls sent at 03/01/2023  1:54 PM EST ----- Please let pt know her MDX urine culture came back positive for Klebsiella pneumoniae.   The recommended antibiotic treatment is either Augmentin PO or Gentamicin IM based on culture sensitivities. Per chart review Augmentin caused nausea/vomiting for her previously - please ask her how severe that was?   If she thinks she can tolerate oral Augmentin 2x/day for 7 days with Zofran PRN then I will treat that way.   If she doesn't think she can tolerate Augmentin at all then please set her up for nurse visits for Gentamicin IM 300 mg x3 days consecutively this week.  Thanks! ----- Message ----- From: Troy Sine, CMA Sent: 02/24/2023  11:54 AM EST To: Donnita Falls, FNP  Please review

## 2023-03-01 NOTE — Telephone Encounter (Signed)
 Patient is made aware and voiced understanding.

## 2023-03-02 ENCOUNTER — Ambulatory Visit (INDEPENDENT_AMBULATORY_CARE_PROVIDER_SITE_OTHER): Payer: Medicare Other

## 2023-03-02 DIAGNOSIS — R399 Unspecified symptoms and signs involving the genitourinary system: Secondary | ICD-10-CM

## 2023-03-02 MED ORDER — GENTAMICIN SULFATE 40 MG/ML IJ SOLN
300.0000 mg | Freq: Once | INTRAMUSCULAR | Status: AC
  Administered 2023-03-02: 300 mg via INTRAMUSCULAR

## 2023-03-02 NOTE — Telephone Encounter (Signed)
Open in error

## 2023-03-02 NOTE — Progress Notes (Unsigned)
 Patient receiving Gentamicin injection per MD order  The injection site was cleaned and prepped with alcohol. A band aid applied after injection given.   IM injection  Medication: Gentamicin  Dose: 300 mg Location: left  Lot: [id number] Exp: [id number]  Patient tolerated well, no complications were noted  Performed by: Kennyth Lose, CMA

## 2023-03-03 ENCOUNTER — Ambulatory Visit (INDEPENDENT_AMBULATORY_CARE_PROVIDER_SITE_OTHER): Payer: Medicare Other

## 2023-03-03 DIAGNOSIS — N390 Urinary tract infection, site not specified: Secondary | ICD-10-CM

## 2023-03-03 DIAGNOSIS — R399 Unspecified symptoms and signs involving the genitourinary system: Secondary | ICD-10-CM | POA: Diagnosis not present

## 2023-03-03 MED ORDER — GENTAMICIN SULFATE 40 MG/ML IJ SOLN
300.0000 mg | Freq: Once | INTRAMUSCULAR | Status: AC
  Administered 2023-03-03: 300 mg via INTRAMUSCULAR

## 2023-03-03 NOTE — Progress Notes (Signed)
 Patient receiving Gentamicin injection per MD order  The injection site was cleaned and prepped with alcohol. A band aid applied after injection given.   IM injection  Medication: Gentamicin Dose: 300 mg  Location: right and left anterior thigh Lot: [id number] Exp: 11/05/2023  Patient tolerated well, no complications were noted  Performed by: Oather Muilenburg Lpn

## 2023-03-04 ENCOUNTER — Ambulatory Visit: Payer: Medicare Other | Admitting: Urology

## 2023-03-04 ENCOUNTER — Other Ambulatory Visit: Payer: Medicare Other

## 2023-03-04 ENCOUNTER — Ambulatory Visit: Payer: Medicare Other

## 2023-03-04 ENCOUNTER — Other Ambulatory Visit: Payer: Self-pay

## 2023-03-04 ENCOUNTER — Other Ambulatory Visit (HOSPITAL_COMMUNITY)
Admission: RE | Admit: 2023-03-04 | Discharge: 2023-03-04 | Disposition: A | Discharge status: Home / Self Care | Payer: Medicare Other | Source: Other Acute Inpatient Hospital | Attending: Physician Assistant | Admitting: Physician Assistant

## 2023-03-04 VITALS — BP 114/71 | HR 77

## 2023-03-04 DIAGNOSIS — N390 Urinary tract infection, site not specified: Secondary | ICD-10-CM

## 2023-03-04 DIAGNOSIS — D508 Other iron deficiency anemias: Secondary | ICD-10-CM | POA: Insufficient documentation

## 2023-03-04 DIAGNOSIS — Z87440 Personal history of urinary (tract) infections: Secondary | ICD-10-CM

## 2023-03-04 DIAGNOSIS — E538 Deficiency of other specified B group vitamins: Secondary | ICD-10-CM | POA: Diagnosis not present

## 2023-03-04 DIAGNOSIS — Z09 Encounter for follow-up examination after completed treatment for conditions other than malignant neoplasm: Secondary | ICD-10-CM | POA: Diagnosis not present

## 2023-03-04 LAB — IRON AND TIBC
Iron: 77 ug/dL (ref 28–170)
Saturation Ratios: 22 % (ref 10.4–31.8)
TIBC: 358 ug/dL (ref 250–450)
UIBC: 281 ug/dL

## 2023-03-04 LAB — URINALYSIS, ROUTINE W REFLEX MICROSCOPIC
Bilirubin, UA: NEGATIVE
Glucose, UA: NEGATIVE
Ketones, UA: NEGATIVE
Leukocytes,UA: NEGATIVE
Nitrite, UA: NEGATIVE
Protein,UA: NEGATIVE
RBC, UA: NEGATIVE
Specific Gravity, UA: <1.005 — ABNORMAL LOW (ref 1.005–1.030)
Urobilinogen, Ur: 0.2 mg/dL (ref 0.2–1.0)
pH, UA: 6 (ref 5.0–7.5)

## 2023-03-04 LAB — CBC
HCT: 41.4 % (ref 36.0–46.0)
Hemoglobin: 14.3 g/dL (ref 12.0–15.0)
MCH: 31.8 pg (ref 26.0–34.0)
MCHC: 34.5 g/dL (ref 30.0–36.0)
MCV: 92 fL (ref 80.0–100.0)
Platelets: 199 10*3/uL (ref 150–400)
RBC: 4.5 MIL/uL (ref 3.87–5.11)
RDW: 13.4 % (ref 11.5–15.5)
WBC: 4.3 10*3/uL (ref 4.0–10.5)
nRBC: 0 % (ref 0.0–0.2)

## 2023-03-04 LAB — FERRITIN: Ferritin: 95 ng/mL (ref 11–307)

## 2023-03-04 LAB — VITAMIN B12: Vitamin B-12: 295 pg/mL (ref 180–914)

## 2023-03-04 MED ORDER — TRIMETHOPRIM 100 MG PO TABS: 100.0000 mg | ORAL_TABLET | Freq: Every evening | ORAL | 5 refills | Status: DC

## 2023-03-04 NOTE — Addendum Note (Signed)
 Addended by: Grier Rocher on: 03/04/2023 11:00 AM   Modules accepted: Orders

## 2023-03-04 NOTE — Progress Notes (Signed)
 03/04/2023 12:21 PM   [name] 06-14-1955 [id number]  Referring provider: Rebekah Chesterfield, NP [address] [place],  [place] [address]  Frequent UTI   HPI: Ms Childers is a 68yo here for followup fro urinary frequency and frequent UTI. Since October she has had issues with frequent UTi and has had 4 documented UTIs since then. She has urinary urgency, frequency, and occasional urge incontinence. She has a vaginal discharge with the estrogen cream. She is currently on gentamicin.    PMH: Past Medical History:  Diagnosis Date   Allergy    Anal fissure    Anemia, iron deficiency 10/26/2022   Cataract    Chronic constipation    Coronary artery disease    Diverticulosis 04/20/2010   colonoscopy   Gastroparesis    GERD (gastroesophageal reflux disease)    Hemorrhoids    Hemorrhoids    Hyperlipidemia    IBS (irritable bowel syndrome)    MVP (mitral valve prolapse)    as a teenager per patient, no issues since   Osteoporosis    Polyp, stomach 04/20/2010   egd   Shingles    UTI (lower urinary tract infection)     Surgical History: Past Surgical History:  Procedure Laterality Date   CHOLECYSTECTOMY N/A 09/21/2021   Procedure: LAPAROSCOPIC CHOLECYSTECTOMY;  Surgeon: Fritzi Mandes, MD;  Location: [facility] OR;  Service: General;  Laterality: N/A;   COLONOSCOPY     CORONARY PRESSURE/FFR STUDY N/A 07/22/2021   Procedure: INTRAVASCULAR PRESSURE WIRE/FFR STUDY;  Surgeon: Lyn Records, MD;  Location: [facility] IN[facility]AS[facility];  Service: Cardiovascular;  Laterality: N/A;   ESOPHAGOGASTRODUODENOSCOPY     LEFT HEART CATH AND CORONARY ANGIOGRAPHY N/A 07/22/2021   Procedure: LEFT HEART CATH AND CORONARY ANGIOGRAPHY;  Surgeon: Lyn Records, MD;  Location: [facility];  Service: Cardiovascular;  Laterality: N/A;    Home Medications:  Allergies as of 03/04/2023       Reactions   Amoxicillin-pot Clavulanate Nausea And Vomiting   Chlordiazepoxide-clidinium Itching   (Librax)    Ciprofloxacin Itching   Irritates stomach   Flagyl [metronidazole]    Severe yeast infection   Sulfa Drugs Cross Reactors Nausea Only   Nitrofurantoin Monohyd Macro Rash   Omnicef [cefdinir] Nausea And Vomiting        Medication List        Accurate as of March 04, 2023 12:21 PM. If you have any questions, ask your nurse or doctor.          aspirin EC 81 MG tablet Take 1 tablet (81 mg total) by mouth daily. Swallow whole.   docusate sodium 100 MG capsule Commonly known as: Colace Take 1 capsule (100 mg total) by mouth 2 (two) times daily.   estradiol 0.1 MG/GM vaginal cream Commonly known as: ESTRACE Discard plastic applicator. Insert a blueberry size amount (approximately 1 gram) of cream on fingertip inside vagina at bedtime every night for 1 week then every other night. For long term use.   ezetimibe 10 MG tablet Commonly known as: ZETIA Take 1 tablet (10 mg total) by mouth daily.   famotidine 20 MG tablet Commonly known as: Pepcid One after supper   fluticasone 50 MCG/ACT nasal spray Commonly known as: FLONASE Place 1 spray into both nostrils daily as needed for allergies or rhinitis.   hydrocortisone 25 MG suppository Commonly known as: ANUSOL-HC Place 1 suppository (25 mg total) rectally at bedtime as needed for hemorrhoids or anal  itching. For 5 days   hyoscyamine 0.125 MG SL tablet Commonly known as: LEVSIN SL Place 1 tablet (0.125 mg total) under the tongue every 6 (six) hours as needed.   levocetirizine 5 MG tablet Commonly known as: XYZAL Take 1 tablet (5 mg total) by mouth every evening. For allergies   Lidocaine (Anorectal) 5 % Crea Commonly known as: RectiCare Apply 1 Application topically as needed.   Linzess 145 MCG Caps capsule Generic drug: linaclotide TAKE 1 CAPSULE BY MOUTH EVERY MORNING BEFORE BREAKFAST   nitroGLYCERIN 0.4 MG SL tablet Commonly known as: NITROSTAT Place 1 tablet (0.4 mg total) under the tongue every 5 (five)  minutes as needed for chest pain.   ondansetron 4 MG tablet Commonly known as: Zofran Take 1 tablet (4 mg total) by mouth every 6 (six) hours as needed for nausea or vomiting. For nausea and vomiting   ondansetron 8 MG disintegrating tablet Commonly known as: ZOFRAN-ODT Take 8 mg by mouth every 8 (eight) hours as needed.   pantoprazole 40 MG tablet Commonly known as: PROTONIX TAKE 1 TABLET BY MOUTH EVERY MORNING BEFORE BREAKFAST   pravastatin 10 MG tablet Commonly known as: PRAVACHOL Take 1 tablet (10 mg total) by mouth 3 (three) times a week. Take on Mondays, Wednesdays, and Fridays.   psyllium 58.6 % packet Commonly known as: METAMUCIL Take 1 packet by mouth every evening.   RectiCare Advanced 5-0.25-17-39 % Crea Generic drug: Lido-PE-Mineral Oil-Petrolatum Use three times daily as needed   senna-docusate 8.6-50 MG tablet Commonly known as: Senokot S Take 1-2 tablets by mouth at bedtime.   Simethicone 250 MG Caps Commonly known as: Phazyme Maximum Strength Take 250 mg by mouth 3 (three) times daily. What changed:  when to take this reasons to take this   sucralfate 1 GM/10ML suspension Commonly known as: CARAFATE Take 10 mLs (1 g total) by mouth 3 (three) times daily.   valACYclovir 1000 MG tablet Commonly known as: VALTREX TAKE 1 TABLET (1,000 MG TOTAL) BY MOUTH AT BEDTIME.   VITAMIN D3 PO Take 1 tablet by mouth daily.        Allergies:  Allergies  Allergen Reactions   Amoxicillin-Pot Clavulanate Nausea And Vomiting   Chlordiazepoxide-Clidinium Itching    (Librax)   Ciprofloxacin Itching    Irritates stomach   Flagyl [Metronidazole]     Severe yeast infection   Sulfa Drugs Cross Reactors Nausea Only   Nitrofurantoin Monohyd Macro Rash   Omnicef [Cefdinir] Nausea And Vomiting    Family History: Family History  Problem Relation Age of Onset   Hypertension Mother    Diabetes Father    Heart attack Father        Age 45   Colon cancer Neg Hx     Esophageal cancer Neg Hx    Rectal cancer Neg Hx     Social History:  reports that she has been smoking cigarettes. She started smoking about 22 years ago. She has a 20 pack-year smoking history. She has never used smokeless tobacco. She reports that she does not currently use alcohol. She reports that she does not use drugs.  ROS: All other review of systems were reviewed and are negative except what is noted above in HPI  Physical Exam: BP 114/71   Pulse 77   Constitutional:  Alert and oriented, No acute distress. HEENT:  AT, moist mucus membranes.  Trachea midline, no masses. Cardiovascular: No clubbing, cyanosis, or edema. Respiratory: Normal respiratory effort, no increased work of breathing.  GI: Abdomen is soft, nontender, nondistended, no abdominal masses GU: No CVA tenderness.  Lymph: No cervical or inguinal lymphadenopathy. Skin: No rashes, bruises or suspicious lesions. Neurologic: Grossly intact, no focal deficits, moving all 4 extremities. Psychiatric: Normal mood and affect.  Laboratory Data: Lab Results  Component Value Date   WBC 4.8 01/24/2023   HGB 14.9 01/24/2023   HCT 43.9 01/24/2023   MCV 91.3 01/24/2023   PLT 208 01/24/2023    Lab Results  Component Value Date   CREATININE 0.78 08/27/2022    No results found for: "PSA"  No results found for: "TESTOSTERONE"  Lab Results  Component Value Date   HGBA1C 5.5 05/08/2018    Urinalysis    Component Value Date/Time   COLORURINE COLORLESS (A) 06/04/2022 1832   APPEARANCEUR Clear 02/21/2023 1019   LABSPEC 1.003 (L) 06/04/2022 1832   PHURINE 7.0 06/04/2022 1832   GLUCOSEU Negative 02/21/2023 1019   GLUCOSEU NEGATIVE 10/09/2014 1544   HGBUR NEGATIVE 06/04/2022 1832   BILIRUBINUR Negative 02/21/2023 1019   KETONESUR negative 09/19/2022 1408   KETONESUR NEGATIVE 06/04/2022 1832   PROTEINUR Negative 02/21/2023 1019   PROTEINUR NEGATIVE 06/04/2022 1832   UROBILINOGEN 0.2 09/19/2022 1408    UROBILINOGEN 0.2 11/11/2014 1321   NITRITE Negative 02/21/2023 1019   NITRITE NEGATIVE 06/04/2022 1832   LEUKOCYTESUR Negative 02/21/2023 1019   LEUKOCYTESUR NEGATIVE 06/04/2022 1832    Lab Results  Component Value Date   LABMICR Comment 02/21/2023   WBCUA None seen 11/13/2020   RBCUA 3-10 (A) 01/23/2018   LABEPIT None seen 11/13/2020   BACTERIA NONE SEEN 09/27/2021    Pertinent Imaging:  Results for orders placed during the hospital encounter of 06/04/22  DG Abd 1 View  Narrative CLINICAL DATA:  Abdominal pain  EXAM: ABDOMEN - 1 VIEW  COMPARISON:  01/02/2021  FINDINGS: Mottled gas pattern is nonspecific. No abnormal masses or calcifications are seen. Surgical clips are seen in right upper quadrant. Small sclerotic density in proximal right femur has not changed, possibly bone island.  IMPRESSION: Nonspecific bowel gas pattern.   Electronically Signed By: Ernie Avena M.D. On: 06/04/2022 15:23  No results found for this or any previous visit.  No results found for this or any previous visit.  No results found for this or any previous visit.  Results for orders placed in visit on 02/03/22  US RENAL  Narrative CLINICAL DATA:  Patient with right flank pain.  EXAM: RENAL / URINARY TRACT ULTRASOUND COMPLETE  COMPARISON:  CT abdomen pelvis 12/24/2021  FINDINGS: Right Kidney:  Renal measurements: 8.6 x 4.4 x 5.2 cm = volume: 100.8 mL. Echogenicity within normal limits. No mass or hydronephrosis visualized.  Left Kidney:  Renal measurements: 9.3 x 4.4 x 4.4 cm = volume: 94.2 mL. Echogenicity within normal limits. No mass or hydronephrosis visualized.  Bladder:  Appears normal for degree of bladder distention.  Other:  None.  IMPRESSION: No hydronephrosis.   Electronically Signed By: Annia Belt M.D. On: 02/09/2022 12:56  No results found for this or any previous visit.  No results found for this or any previous  visit.  Results for orders placed during the hospital encounter of 11/08/20  CT Renal Stone Study  Narrative CLINICAL DATA:  Bilateral flank pain for 5 days. Kidney stone suspected. Patient recently diagnosed with UTI.  EXAM: CT ABDOMEN AND PELVIS WITHOUT CONTRAST  TECHNIQUE: Multidetector CT imaging of the abdomen and pelvis was performed following the standard protocol without IV contrast.  COMPARISON:  September 05, 2020  FINDINGS: Lower chest: 4 mm nodule in the right lower lobe adjacent to the fissure on series 4, image 6. This region was not included on previous studies. No other abnormalities in the lung bases.  Hepatobiliary: No focal liver abnormality is seen. No gallstones, gallbladder wall thickening, or biliary dilatation.  Pancreas: Unremarkable. No pancreatic ductal dilatation or surrounding inflammatory changes.  Spleen: Normal in size without focal abnormality.  Adrenals/Urinary Tract: Adrenal glands are unremarkable. Kidneys are normal, without renal calculi, focal lesion, or hydronephrosis. Bladder is unremarkable.  Stomach/Bowel: The wall of the gastric fundus, cardia, and proximal body appear somewhat thickened. The remainder of the stomach is normal. Small bowel is normal. And appendix are unremarkable.  Vascular/Lymphatic: Calcified atherosclerosis in the nonaneurysmal aorta. No adenopathy.  Reproductive: Uterus and bilateral adnexa are unremarkable.  Other: No free air free fluid. A fat containing umbilical hernia is identified.  Musculoskeletal: No acute or significant osseous findings.  IMPRESSION: 1. 4 mm nodule in the right lower lobe adjacent to the fissure on series 4, image 6. This region was not included on previous studies. No follow-up needed if patient is low-risk. Non-contrast chest CT can be considered in 12 months if patient is high-risk. This recommendation follows the consensus statement: Guidelines for Management of  Incidental Pulmonary Nodules Detected on CT Images: From the Fleischner Society 2017; Radiology 2017; 284:228-243. 2. The wall of the gastric fundus, cardia, and proximal body appear thickened. While this could be due to lack of distention, the finding is similar since previous studies raising the possibility of tree thickening. Endoscopy or upper GI could better evaluate. Recommend clinical correlation. 3. Calcified atherosclerosis in the nonaneurysmal abdominal aorta. 4. Fat containing umbilical hernia. 5. No other abnormalities. Specifically, no renal stones or obstruction.   Electronically Signed By: Gerome Sam III M.D. On: 11/08/2020 17:35   Assessment & Plan:    1. Recurrent UTI (Primary) Patient to start trimethoprim after finishing gentamicin - Urinalysis, Routine w reflex microscopic   No follow-ups on file.  Wilkie Aye, MD  [facility] [place]

## 2023-03-06 LAB — METHYLMALONIC ACID, SERUM: Methylmalonic Acid, Quantitative: 411 nmol/L — ABNORMAL HIGH (ref 0–378)

## 2023-03-07 ENCOUNTER — Encounter: Payer: Self-pay | Admitting: Physician Assistant

## 2023-03-07 ENCOUNTER — Telehealth: Payer: Self-pay | Admitting: Urology

## 2023-03-07 NOTE — Telephone Encounter (Signed)
 Patient called about the results of the lab work that she had done Friday, she looked in her Mychart and she said they dont look good, how are you proceeding?    Patient also wants a call back about the shots you gave her in her legs, she is having a lot of pain in her legs now from them.

## 2023-03-07 NOTE — Telephone Encounter (Signed)
 Patient called and informed that gentamicin trough is still showing active with no results posted. Patient states she will call back later this week to check for results.

## 2023-03-11 ENCOUNTER — Ambulatory Visit: Payer: Medicare Other | Admitting: Urology

## 2023-03-13 ENCOUNTER — Encounter: Payer: Self-pay | Admitting: Urology

## 2023-03-13 NOTE — Patient Instructions (Signed)

## 2023-03-15 ENCOUNTER — Ambulatory Visit (HOSPITAL_COMMUNITY)
Admission: RE | Admit: 2023-03-15 | Discharge: 2023-03-15 | Disposition: A | Discharge status: Home / Self Care | Payer: Medicare Other | Source: Ambulatory Visit | Attending: Internal Medicine | Admitting: Internal Medicine

## 2023-03-30 ENCOUNTER — Encounter: Payer: Self-pay | Admitting: Urology

## 2023-03-30 ENCOUNTER — Ambulatory Visit: Admitting: Urology

## 2023-03-30 DIAGNOSIS — Z87440 Personal history of urinary (tract) infections: Secondary | ICD-10-CM | POA: Diagnosis not present

## 2023-03-30 DIAGNOSIS — N390 Urinary tract infection, site not specified: Secondary | ICD-10-CM | POA: Diagnosis not present

## 2023-03-30 DIAGNOSIS — N952 Postmenopausal atrophic vaginitis: Secondary | ICD-10-CM | POA: Diagnosis not present

## 2023-03-30 LAB — URINALYSIS, ROUTINE W REFLEX MICROSCOPIC
Bilirubin, UA: NEGATIVE
Glucose, UA: NEGATIVE
Ketones, UA: NEGATIVE
Leukocytes,UA: NEGATIVE
Nitrite, UA: NEGATIVE
Protein,UA: NEGATIVE
RBC, UA: NEGATIVE
Specific Gravity, UA: 1.02 (ref 1.005–1.030)
Urobilinogen, Ur: 0.2 mg/dL (ref 0.2–1.0)
pH, UA: 6 (ref 5.0–7.5)

## 2023-03-30 LAB — BLADDER SCAN AMB NON-IMAGING: Scan Result: 0

## 2023-03-30 NOTE — Progress Notes (Signed)
 Name: JHANAE JASKOWIAK DOB: 01-14-1955 MRN: [id number]  History of Present Illness: Ruth Terry is a 68 y.o. female who presents today for follow up visit at [facility] [place]. - GU history: 1. Recurrent UTI. - Using topical vaginal estrogen cream 3x/week.  - 03/01/2023: Normal RUS (no GU stones, masses, or hydronephrosis; bladder unremarkable). 2. Voiding dysfunction. - Was previously taking Uroxatrol (Alfuzosin) 10 mg daily. - Possibly related to history of gastroparesis, IBS with chronic constipation, hemorrhoids.  Urine culture results in past 12 months: - 05/20/2022: Negative - 11/01/2022: Positive for E. Coli; treated with Keflex 500 mg 4x/day for 1 week. - 02/08/2023: Positive for Proteus vulgaris; treated with IM Rocephin 3 days consecutively. - 02/14/2023: Positive for Enterococcus faecalis; no antibiotic prescribed. - 02/21/2023: MDX urine culture came back positive for Klebsiella pneumoniae. Treated with Gentamicin IM 300 mg x3 days.  At last visit with Dr. Ronne Binning on 03/04/2023: "Patient to start trimethoprim after finishing gentamicin".  Today: She reports urinary urgency, frequency, dysuria, and discomfort in "my back, flank, groin, and female organs (vagina / urethra)". Denies fever, gross hematuria, straining to void, or sensations of incomplete emptying. Reports drinking 1 caffeinated beverage per day on average (tea with lunch). She has been using topical vaginal estrogen cream 3x/week for about 5 weeks now and confirms that she is taking Trimethoprim 100 mg daily for UTI prophylaxis.  Denies seeing a GYN provider in 1-2 years; states her prior GYN provider retired.   Medications: Current Outpatient Medications  Medication Sig Dispense Refill   aspirin EC 81 MG tablet Take 1 tablet (81 mg total) by mouth daily. Swallow whole. 90 tablet 3   Cholecalciferol (VITAMIN D3 PO) Take 1 tablet by mouth daily.     docusate sodium (COLACE) 100 MG capsule Take 1  capsule (100 mg total) by mouth 2 (two) times daily. 20 capsule 0   estradiol (ESTRACE) 0.1 MG/GM vaginal cream Discard plastic applicator. Insert a blueberry size amount (approximately 1 gram) of cream on fingertip inside vagina at bedtime every night for 1 week then every other night. For long term use. 30 g 3   ezetimibe (ZETIA) 10 MG tablet Take 1 tablet (10 mg total) by mouth daily. 90 tablet 3   famotidine (PEPCID) 20 MG tablet One after supper     fluticasone (FLONASE) 50 MCG/ACT nasal spray Place 1 spray into both nostrils daily as needed for allergies or rhinitis.     hydrocortisone (ANUSOL-HC) 25 MG suppository Place 1 suppository (25 mg total) rectally at bedtime as needed for hemorrhoids or anal itching. For 5 days 5 suppository 1   hyoscyamine (LEVSIN SL) 0.125 MG SL tablet Place 1 tablet (0.125 mg total) under the tongue every 6 (six) hours as needed. 40 tablet 0   levocetirizine (XYZAL) 5 MG tablet Take 1 tablet (5 mg total) by mouth every evening. For allergies 90 tablet 3   Lido-PE-Mineral Oil-Petrolatum (RECTICARE ADVANCED) 5-0.25-17-39 % CREA Use three times daily as needed     Lidocaine, Anorectal, (RECTICARE) 5 % CREA Apply 1 Application topically as needed. 28 g 0   linaclotide (LINZESS) 145 MCG CAPS capsule TAKE 1 CAPSULE BY MOUTH EVERY MORNING BEFORE BREAKFAST 90 capsule 2   nitroGLYCERIN (NITROSTAT) 0.4 MG SL tablet Place 1 tablet (0.4 mg total) under the tongue every 5 (five) minutes as needed for chest pain. 25 tablet 3   ondansetron (ZOFRAN) 4 MG tablet Take 1 tablet (4 mg total) by mouth every 6 (six) hours  as needed for nausea or vomiting. For nausea and vomiting 30 tablet 2   ondansetron (ZOFRAN-ODT) 8 MG disintegrating tablet Take 8 mg by mouth every 8 (eight) hours as needed.     pantoprazole (PROTONIX) 40 MG tablet TAKE 1 TABLET BY MOUTH EVERY MORNING BEFORE BREAKFAST 90 tablet 2   pravastatin (PRAVACHOL) 10 MG tablet Take 1 tablet (10 mg total) by mouth 3 (three)  times a week. Take on Mondays, Wednesdays, and Fridays. 35 tablet 3   psyllium (METAMUCIL) 58.6 % packet Take 1 packet by mouth every evening.     senna-docusate (SENOKOT S) 8.6-50 MG tablet Take 1-2 tablets by mouth at bedtime.     Simethicone (PHAZYME MAXIMUM STRENGTH) 250 MG CAPS Take 250 mg by mouth 3 (three) times daily. (Patient taking differently: Take 250 mg by mouth 3 (three) times daily as needed (gas relief).) 14 capsule    sucralfate (CARAFATE) 1 GM/10ML suspension Take 10 mLs (1 g total) by mouth 3 (three) times daily. 1200 mL 0   trimethoprim (TRIMPEX) 100 MG tablet Take 1 tablet (100 mg total) by mouth at bedtime. 30 tablet 5   valACYclovir (VALTREX) 1000 MG tablet TAKE 1 TABLET (1,000 MG TOTAL) BY MOUTH AT BEDTIME. 90 tablet 0   No current facility-administered medications for this visit.    Allergies: Allergies  Allergen Reactions   Amoxicillin-Pot Clavulanate Nausea And Vomiting   Chlordiazepoxide-Clidinium Itching    (Librax)   Ciprofloxacin Itching    Irritates stomach   Flagyl [Metronidazole]     Severe yeast infection   Sulfa Drugs Cross Reactors Nausea Only   Nitrofurantoin Monohyd Macro Rash   Omnicef [Cefdinir] Nausea And Vomiting    Past Medical History:  Diagnosis Date   Allergy    Anal fissure    Anemia, iron deficiency 10/26/2022   Cataract    Chronic constipation    Coronary artery disease    Diverticulosis 04/20/2010   colonoscopy   Gastroparesis    GERD (gastroesophageal reflux disease)    Hemorrhoids    Hemorrhoids    Hyperlipidemia    IBS (irritable bowel syndrome)    MVP (mitral valve prolapse)    as a teenager per patient, no issues since   Osteoporosis    Polyp, stomach 04/20/2010   egd   Shingles    UTI (lower urinary tract infection)    Past Surgical History:  Procedure Laterality Date   CHOLECYSTECTOMY N/A 09/21/2021   Procedure: LAPAROSCOPIC CHOLECYSTECTOMY;  Surgeon: Fritzi Mandes, MD;  Location: [facility] OR;  Service: General;   Laterality: N/A;   COLONOSCOPY     CORONARY PRESSURE/FFR STUDY N/A 07/22/2021   Procedure: INTRAVASCULAR PRESSURE WIRE/FFR STUDY;  Surgeon: Lyn Records, MD;  Location: [facility] INVASIVE CV LAB;  Service: Cardiovascular;  Laterality: N/A;   ESOPHAGOGASTRODUODENOSCOPY     LEFT HEART CATH AND CORONARY ANGIOGRAPHY N/A 07/22/2021   Procedure: LEFT HEART CATH AND CORONARY ANGIOGRAPHY;  Surgeon: Lyn Records, MD;  Location: [facility] INVASIVE CV LAB;  Service: Cardiovascular;  Laterality: N/A;   Family History  Problem Relation Age of Onset   Hypertension Mother    Diabetes Father    Heart attack Father        Age 72   Colon cancer Neg Hx    Esophageal cancer Neg Hx    Rectal cancer Neg Hx    Social History   Socioeconomic History   Marital status: Married    Spouse name: Not on file   Number of  children: 0   Years of education: Not on file   Highest education level: Not on file  Occupational History   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    Employer: [employer]  Tobacco Use   Smoking status: Some Days    Current packs/day: 0.00    Average packs/day: 1 pack/day for 20.0 years (20.0 ttl pk-yrs)    Types: Cigarettes    Start date: 05/2000    Last attempt to quit: 05/2020    Years since quitting: 2.9   Smokeless tobacco: Never   Tobacco comments:    USE SMOKELESS CIGARETTES  Vaping Use   Vaping status: Some Days   Substances: Nicotine  Substance and Sexual Activity   Alcohol use: Not Currently    Comment: 1 a week, wine   Drug use: No   Sexual activity: Not Currently  Other Topics Concern   Not on file  Social History Narrative   Not on file   Social Drivers of Health   Financial Resource Strain: Not on file  Food Insecurity: Not on file  Transportation Needs: Not on file  Physical Activity: Not on file  Stress: Not on file  Social Connections: Not on file  Intimate Partner Violence: Not on file    Review of  Systems Constitutional: Patient denies any unintentional weight loss or change in strength lntegumentary: Patient denies any rashes or pruritus Cardiovascular: Patient denies chest pain or syncope Respiratory: Patient denies shortness of breath Gastrointestinal: Chronic constipation Musculoskeletal: Patient denies muscle cramps or weakness Neurologic: Patient denies convulsions or seizures Allergic/Immunologic: Patient denies recent allergic reaction(s) Hematologic/Lymphatic: Patient denies bleeding tendencies Endocrine: Patient denies heat/cold intolerance  GU: As per HPI.  OBJECTIVE There were no vitals filed for this visit. There is no height or weight on file to calculate BMI.  Physical Examination Constitutional: No obvious distress; patient is non-toxic appearing  Cardiovascular: No visible lower extremity edema.  Respiratory: The patient does not have audible wheezing/stridor; respirations do not appear labored  Gastrointestinal: Abdomen non-distended Musculoskeletal: Normal ROM of UEs  Skin: No obvious rashes/open sores  Neurologic: CN 2-12 grossly intact Psychiatric: Answered questions appropriately with normal affect  Hematologic/Lymphatic/Immunologic: No obvious bruises or sites of spontaneous bleeding  UA: negative  PVR: 0 ml  ASSESSMENT Recurrent UTI - Plan: Urinalysis, Routine w reflex microscopic, BLADDER SCAN AMB NON-IMAGING, Urine culture, Ambulatory referral to Gynecology  Atrophic vaginitis - Plan: Ambulatory referral to Gynecology  She reports that her UA is typically normal but that her urine cultures then often come back positive, therefore she requests urine culture be sent today despite normal UA. Advised to continue Trimethoprim 100 mg daily for UTI prophylaxis.   We discussed that if urine culture is negative then her current symptoms may be related to some (or all) of the following: - post-infectious inflammation, which should resolve with time -  genito-urinary syndrome of menopause (GSM), aka vaginal atrophy. We reviewed the rationale for use of topical vaginal estrogen cream. She was advised it can take up to 3 months (12 weeks) of consistent use to improve vaginal/urethra tissue integrity, pH balance, bacterial microbiome, etc. and she hasn't been on it that long yet.  - possible alternative inflammatory conditions such as lichen sclerosus - possible OAB with urinary frequency, urgency, and occasional urge incontinence - possible high-tone pelvic floor dysfunction, possibly exacerbated by her chronic bowel issues  Discussed option to try Gemtesa for urinary urgency & frequency; she elected to hold  off on that for now.  She agreed to accept GYN referral to establish care with new provider for further assessment.   May consider pelvic floor physical therapy in the future if pelvic pain persists.   We agreed to plan for follow up in 8 weeks or sooner if needed. Patient verbalized understanding of and agreement with current plan. All questions were answered.  PLAN Advised the following: 1. Urine culture. 2. Continue Trimethoprim 100 mg daily for UTI prophylaxis. 3. Continue topical vaginal estrogen cream 3x/week.  4. Referred to GYN.  5. Return in about 8 weeks (around 05/25/2023) for UA, PVR, & f/u with Evette Georges NP.  Orders Placed This Encounter  Procedures   Urine culture   Urinalysis, Routine w reflex microscopic   Ambulatory referral to Gynecology    Referral Priority:   Routine    Referral Type:   Consultation    Referral Reason:   Specialty Services Required    Requested Specialty:   Gynecology    Number of Visits Requested:   1   BLADDER SCAN AMB NON-IMAGING   Total time spent caring for the patient today was over 30 minutes. This includes time spent on the date of the visit reviewing the patient's chart before the visit, time spent during the visit, and time spent after the visit on documentation. Over 50% of that  time was spent in face-to-face time with this patient for direct counseling. E&M based on time and complexity of medical decision making.  It has been explained that the patient is to follow regularly with their PCP in addition to all other providers involved in their care and to follow instructions provided by these respective offices. Patient advised to contact urology clinic if any urologic-pertaining questions, concerns, new symptoms or problems arise in the interim period.  Patient Instructions  UTI prevention / management:  Difference between Urinalysis (urine dipstick test) and Urine culture:  Urinalysis (urine dipstick test): A quick office test used as an indicator to determine whether or not further testing is necessary (such as a urine culture, urine microscopy, etc.) The urinalysis cannot differentiate a true bacterial UTI or give a definitive diagnosis for the findings.  Urine culture: May be performed based on the findings of a urinalysis to evaluate for UTI. Grows out on a petri dish for 48-72 hours. Provides important information about: whether or not bacterial growth is present and if so: what the predominant bacteria is which antibiotics will work best against that bacteria That information is important so that we can diagnose and treat patients appropriately as there are other conditions which may mimic UTls which must not be missed (such as cancer, interstitial cystitis, stones, etc.). Assists [place] with antibiotic stewardship to minimize patient's risk for developing antibiotic resistance (getting to a point where no antibiotics work anymore).  Options when UTI symptoms occur: 1. Call [facility] [place] and request to speak with triage nurse (phone # [phone], select option 3). In accordance with clinic guidelines the nurse will determine next steps based on patient-reported symptoms, which may include: same-day lab visit to provide urine specimen,  recommendation to schedule Urology office visit appointment for further evaluation, recommendation to proceed to ER, etc. 2. Call your Primary Care Provider (PCP) office to request urgent / same-day visit. Be sure to request for urine culture to be ordered and have results faxed to Urology (fax # [phone]).  3. Go to urgent care. Be sure to request for urine culture to be ordered  and have results faxed to Urology (fax # [phone]).   For bladder pain/ burning with urination: - Can take over-the-counter Pyridium (phenazopyridine; commonly known under the "AZO" brand) for a few days as needed. Limit use to no more than 3 days consecutively due to risk for methemoglobinemia, liver function issues, and bone health damage with long term use of Pyridium. - Alternative: Prescription urinary analgesics (such as Uribel, Urogesic blue, Urelle, Uro-MP). Often expensive / poorly covered by insurance unfortunately.  Options / recommendations for UTI prevention: - Low dose antibiotic daily for UTI prophylaxis. - Topical vaginal estrogen for vaginal atrophy (aka Genitourinary Syndrome of Menopause (GSM)). - Adequate daily fluid intake to flush out the urinary tract. - Go to the bathroom to urinate every 4-6 hours while awake to minimize urinary stasis / bacterial overgrowth in the bladder. - Proanthocyanidin (PAC) supplement 36 mg daily; must be soluble (insoluble form of PAC will be ineffective). Recommended brand: Ellura. This is an over-the-counter supplement (often must be found/ purchased online) supplement derived from cranberries with concentrated active component: Proanthocyanidin (PAC) 36 mg daily. Decreases bacterial adherence to bladder lining.  - D-mannose powder (2 grams daily). This is an over-the-counter supplement which decreases bacterial adherence to bladder lining (it is a sugar that inhibits bacterial adherence to urothelial cells by binding to the pili of enteric bacteria). Take as per  manufacturer recommendation. Can be used as an alternative or in addition to the concentrated cranberry supplement.  - Vitamin C supplement to acidify urine to minimize bacterial growth.  - Probiotic to maintain healthy vaginal microbiome to suppress bacteria at urethral opening. Brand recommendations: Darrold Junker (includes probiotic & D-mannose ), Feminine Balance (highest concentration of lactobacillus) or Hyperbiotic Pro 15.  Note for patients with diabetes:  - Be aware that D-mannose contains sugar.    Vaginal atrophy I Genitourinary syndrome of menopause (GSM):  What it is: Changes in the vaginal environment (including the vulva and urethra) including: Thinning of the epithelium (skin/ mucosa surface) Can contribute to urinary urgency and frequency Can contribute to dryness, itching, irritation of the vulvar and vaginal tissue Can contribute to pain with intercourse Can contribute to physical changes of the labia, vulva, and vagina such as: Narrowing of the vaginal opening Decreased vaginal length Loss of labial architecture Labial adhesions Pale color of vulvovaginal tissue  Loss of pubic hair Allows bacteria to become adherent  Results in increased risk for urinary tract infection (UTI) due to bacterial overgrowth and migration up the urethra into the bladder Change in vaginal pH (acid/ base balance) Allows for alteration / disruption of the normal bacterial flora / microbiome Results in increased risk for urinary tract infection (UTI) due to bacterial overgrowth  Treatment options: Over-the-counter lubricants (see list below). Prescription vaginal estrogen replacement. Options: Topical vaginal estrogen cream Estrace, Premarin, or compounded estradiol cream/ gel We advise: Discard plastic applicator as that tends to use more medication than you need, which is not harmful but wastes / uses up the medication. Also the plastic applicator may cause discomfort. Insert blueberry  size amount of medication via the tip of your finger inside vagina nightly for 1 week then 2-3 times per week (long term). Estring vaginal ring Exchanged every 3 months (either at home or in office by provider) Vagifem vaginal tablet Inserted nightly for 2 weeks then twice a week (long term) lntrarosa vaginal suppository Vaginal DHEA: converts to estrogen in vaginal tissue without systemic effect Inserted nightly (long term) Vaginal laser therapy (Mona Lisa touch) Performed  in 3 treatments each 6 weeks apart (available in our [place] office). Can feel like a sunburn for 3-4 days after each treatment until new skin heals in. Usually not covered by insurance. Estimated cost is $1500 for all 3 sessions.  FYI regarding prescription vaginal estrogen treatment options: All topical vaginal estrogen replacement options are equivalent in terms of efficacy. Topical vaginal estrogen replacement will take about 3 months to be effective. OK to have sex with any of the topical vaginal estrogen replacement options. Topical vaginal estrogen replacement may sting/burn initially due to severe dryness, which will improve with ongoing treatment. There have been studies that evaluate use of low-dose intravaginal estrogen that show minimal systemic absorption which is negligible after 3 weeks. There have been no studies indicating increased risk of contributing to cancer development or recurrence.  Topical vaginal estrogen cream safe to use with breast cancer history [email or web address]  Topical vaginal estrogen cream safe to use with blood clot history [email or web address]   Lubricants and Moisturizers for Treating Genitourinary Syndrome of Menopause and Vulvovaginal  Atrophy Treatment Comments I Available Products   Lubricants   Water-based Ingredients: Deionized water, glycerin, propylene glycol; latex safe; rare irritation; dry out with extended sexual activity Astroglide, Good Clean Love, K-Y Jelly, Natural, Organic, Pink, Sliquid, Sylk, Yes    Oil Based Ingredients: avocado, olive, peanut, corn; latex safe; can be used with silicone products; staining; safe (unless peanut allergy); non-irritating Coconut oil, vegetable oil, vitamin E oil  Silicone-Based Ingredients: Silicone polymers; staining; typically nonirritating, long lasting; waterproof; should not be used with silicone dilators, sexual toys, or gynecologic products Astroglide X, Oceanus Ultra Pure, Pink Silicone, Pjur Eros, Replens Silky Smooth, Silicone Premium JO, SKYN, Uberlube, [employer] Based Minimize harm to sperm motility; designed Astroglide TTC, Conceive Plus, Pre for couples trying to conceive Seed, Yes Baby  Fertility Friendly Minimize harm to sperm motility; designed Astroglide, TTC, Conceive Plus, Pre for couples trying to conceive Seed, Yes Baby  Vaginal Moisturizers   Vaginal Moisturizers For maintenance use 1 to 3 times weekly; can benefit women with dryness, chafing with AOL, and recurrent vaginal infections irrespective of sexual activity timing Balance Active Menopause Vaginal Moisturizing Lubricant, Canesintima Intimate Moisturizer, Replens, Rephresh, Sylk Natural Intimate Moisturizer, Yes Vaginal Moisturizer  Hybrids Properties of both water and silicone-based products (combination of a vaginal lubricant and moisturizer); Non-irritating; good option for women with allergies and sensitivities Lubrigyn, Luvena  Suppositories Hyaluronic acid to retain moisture Revaree  Vulvar Soothing Creams/Oils    Medicated CreamsP ain and burn relief; Ingredients: 4% Lidocaine, Aloe Vera gel Releveum ([place])  Non-Medicated Creams For anti-itch and  moisture/maintenance; Ingredients: Coconut oil, Avocado oil, Shea Butter, Olive oil, Vitamin E Vajuvenate, Vmagic  Oils !For moisture/maintenance !Coconut oil, Vitamin E oil, Emu oil     Electronically signed by:  Donnita Falls, FNP   03/30/23    11:11 AM

## 2023-03-30 NOTE — Progress Notes (Signed)
 Bladder Scan completed today.  Patient can void prior to the bladder scan. Bladder scan result: 0  Performed By: Gwendolyn Grant T. CMA  Additional notes-

## 2023-03-30 NOTE — Patient Instructions (Addendum)
 UTI prevention / management:  Difference between Urinalysis (urine dipstick test) and Urine culture:  Urinalysis (urine dipstick test): A quick office test used as an indicator to determine whether or not further testing is necessary (such as a urine culture, urine microscopy, etc.) The urinalysis cannot differentiate a true bacterial UTI or give a definitive diagnosis for the findings.  Urine culture: May be performed based on the findings of a urinalysis to evaluate for UTI. Grows out on a petri dish for 48-72 hours. Provides important information about: whether or not bacterial growth is present and if so: what the predominant bacteria is which antibiotics will work best against that bacteria That information is important so that we can diagnose and treat patients appropriately as there are other conditions which may mimic UTls which must not be missed (such as cancer, interstitial cystitis, stones, etc.). Assists [place] with antibiotic stewardship to minimize patient's risk for developing antibiotic resistance (getting to a point where no antibiotics work anymore).  Options when UTI symptoms occur: 1. Call [facility] [place] and request to speak with triage nurse (phone # [phone], select option 3). In accordance with clinic guidelines the nurse will determine next steps based on patient-reported symptoms, which may include: same-day lab visit to provide urine specimen, recommendation to schedule Urology office visit appointment for further evaluation, recommendation to proceed to ER, etc. 2. Call your Primary Care Provider (PCP) office to request urgent / same-day visit. Be sure to request for urine culture to be ordered and have results faxed to Urology (fax # [phone]).  3. Go to urgent care. Be sure to request for urine culture to be ordered and have results faxed to Urology (fax # [phone]).   For bladder pain/ burning with urination: - Can take over-the-counter  Pyridium (phenazopyridine; commonly known under the "AZO" brand) for a few days as needed. Limit use to no more than 3 days consecutively due to risk for methemoglobinemia, liver function issues, and bone health damage with long term use of Pyridium. - Alternative: Prescription urinary analgesics (such as Uribel, Urogesic blue, Urelle, Uro-MP). Often expensive / poorly covered by insurance unfortunately.  Options / recommendations for UTI prevention: - Low dose antibiotic daily for UTI prophylaxis. - Topical vaginal estrogen for vaginal atrophy (aka Genitourinary Syndrome of Menopause (GSM)). - Adequate daily fluid intake to flush out the urinary tract. - Go to the bathroom to urinate every 4-6 hours while awake to minimize urinary stasis / bacterial overgrowth in the bladder. - Proanthocyanidin (PAC) supplement 36 mg daily; must be soluble (insoluble form of PAC will be ineffective). Recommended brand: Ellura. This is an over-the-counter supplement (often must be found/ purchased online) supplement derived from cranberries with concentrated active component: Proanthocyanidin (PAC) 36 mg daily. Decreases bacterial adherence to bladder lining.  - D-mannose powder (2 grams daily). This is an over-the-counter supplement which decreases bacterial adherence to bladder lining (it is a sugar that inhibits bacterial adherence to urothelial cells by binding to the pili of enteric bacteria). Take as per manufacturer recommendation. Can be used as an alternative or in addition to the concentrated cranberry supplement.  - Vitamin C supplement to acidify urine to minimize bacterial growth.  - Probiotic to maintain healthy vaginal microbiome to suppress bacteria at urethral opening. Brand recommendations: Darrold Junker (includes probiotic & D-mannose ), Feminine Balance (highest concentration of lactobacillus) or Hyperbiotic Pro 15.  Note for patients with diabetes:  - Be aware that D-mannose contains sugar.    Vaginal  atrophy I  Genitourinary syndrome of menopause (GSM):  What it is: Changes in the vaginal environment (including the vulva and urethra) including: Thinning of the epithelium (skin/ mucosa surface) Can contribute to urinary urgency and frequency Can contribute to dryness, itching, irritation of the vulvar and vaginal tissue Can contribute to pain with intercourse Can contribute to physical changes of the labia, vulva, and vagina such as: Narrowing of the vaginal opening Decreased vaginal length Loss of labial architecture Labial adhesions Pale color of vulvovaginal tissue Loss of pubic hair Allows bacteria to become adherent  Results in increased risk for urinary tract infection (UTI) due to bacterial overgrowth and migration up the urethra into the bladder Change in vaginal pH (acid/ base balance) Allows for alteration / disruption of the normal bacterial flora / microbiome Results in increased risk for urinary tract infection (UTI) due to bacterial overgrowth  Treatment options: Over-the-counter lubricants (see list below). Prescription vaginal estrogen replacement. Options: Topical vaginal estrogen cream Estrace, Premarin, or compounded estradiol cream/ gel We advise: Discard plastic applicator as that tends to use more medication than you need, which is not harmful but wastes / uses up the medication. Also the plastic applicator may cause discomfort. Insert blueberry size amount of medication via the tip of your finger inside vagina nightly for 1 week then 2-3 times per week (long term). Estring vaginal ring Exchanged every 3 months (either at home or in office by provider) Vagifem vaginal tablet Inserted nightly for 2 weeks then twice a week (long term) lntrarosa vaginal suppository Vaginal DHEA: converts to estrogen in vaginal tissue without systemic effect Inserted nightly (long term) Vaginal laser therapy (Mona Lisa touch) Performed in 3 treatments each 6 weeks apart  (available in our [place] office). Can feel like a sunburn for 3-4 days after each treatment until new skin heals in. Usually not covered by insurance. Estimated cost is $1500 for all 3 sessions.  FYI regarding prescription vaginal estrogen treatment options: All topical vaginal estrogen replacement options are equivalent in terms of efficacy. Topical vaginal estrogen replacement will take about 3 months to be effective. OK to have sex with any of the topical vaginal estrogen replacement options. Topical vaginal estrogen replacement may sting/burn initially due to severe dryness, which will improve with ongoing treatment. There have been studies that evaluate use of low-dose intravaginal estrogen that show minimal systemic absorption which is negligible after 3 weeks. There have been no studies indicating increased risk of contributing to cancer development or recurrence.  Topical vaginal estrogen cream safe to use with breast cancer history [email or web address]  Topical vaginal estrogen cream safe to use with blood clot history [email or web address]   Lubricants and Moisturizers for Treating Genitourinary Syndrome of Menopause and Vulvovaginal Atrophy Treatment Comments I Available Products   Lubricants   Water-based Ingredients: Deionized water, glycerin, propylene glycol; latex safe; rare irritation; dry out with extended sexual activity Astroglide, Good Clean Love, K-Y Jelly, Natural, Organic, Pink, Sliquid, Sylk, Yes    Oil Based Ingredients: avocado, olive, peanut, corn; latex safe; can be used with silicone products; staining; safe (unless peanut allergy); non-irritating Coconut oil, vegetable oil, vitamin E oil  Silicone-Based Ingredients:  Silicone polymers; staining; typically nonirritating, long lasting; waterproof; should not be used with silicone dilators, sexual toys, or gynecologic products Astroglide X, Oceanus Ultra Pure, Pink Silicone, Pjur Eros, Replens Silky Smooth, Silicone Premium JO, SKYN, Uberlube, [employer] Based Minimize harm to sperm motility; designed Astroglide TTC, Conceive Plus, Pre for couples trying to conceive Seed, Yes Baby  Fertility Friendly Minimize harm to sperm motility; designed Astroglide, TTC, Conceive Plus, Pre for couples trying to conceive Seed, Yes Baby  Vaginal Moisturizers   Vaginal Moisturizers For maintenance use 1 to 3 times weekly; can benefit women with dryness, chafing with AOL, and recurrent vaginal infections irrespective of sexual activity timing Balance Active Menopause Vaginal Moisturizing Lubricant, Canesintima Intimate Moisturizer, Replens, Rephresh, Sylk Natural Intimate Moisturizer, Yes Vaginal Moisturizer  Hybrids Properties of both water and silicone-based products (combination of a vaginal lubricant and moisturizer); Non-irritating; good option for women with allergies and sensitivities Lubrigyn, Luvena  Suppositories Hyaluronic acid to retain moisture Revaree  Vulvar Soothing Creams/Oils    Medicated CreamsP ain and burn relief; Ingredients: 4% Lidocaine, Aloe Vera gel Releveum ([place])  Non-Medicated Creams For anti-itch and moisture/maintenance; Ingredients: Coconut oil, Avocado oil, Shea Butter, Olive oil, Vitamin E Vajuvenate, Vmagic  Oils !For moisture/maintenance !Coconut oil, Vitamin E oil, Emu oil

## 2023-04-03 LAB — URINE CULTURE

## 2023-04-04 ENCOUNTER — Other Ambulatory Visit: Payer: Self-pay | Admitting: Urology

## 2023-04-04 ENCOUNTER — Telehealth: Payer: Self-pay | Admitting: Urology

## 2023-04-04 DIAGNOSIS — N390 Urinary tract infection, site not specified: Secondary | ICD-10-CM

## 2023-04-04 MED ORDER — FOSFOMYCIN TROMETHAMINE 3 G PO PACK: PACK | ORAL | 0 refills | Status: AC

## 2023-04-04 NOTE — Telephone Encounter (Signed)
 Wants to discuss what her options are thought she could get another series of shots to keep it out of her digestive track and also wants to set up a f/u appt also states worried she will vomit up the medication due to her stomach condition but if NP believes this is the best abx she will call her pharmacy to have it filled

## 2023-04-04 NOTE — Telephone Encounter (Signed)
 Still battling UTI wants someone to call about what to do next

## 2023-04-04 NOTE — Telephone Encounter (Signed)
 Called to relay message from NP to continue with her new abx Pt advised to finish her new abx then if symptoms remain to call back to schedule appt if needed

## 2023-04-04 NOTE — Telephone Encounter (Signed)
 Called Pt let her know that her urine culture hasn't been viewed by NP as of yet but when she reviews it someone will be in touch w/ her for needed f/u Pt states she would like a c/b before being prescribed a new Rx states she only has 40% stomach functions so she doesn't do well on abx

## 2023-04-06 DIAGNOSIS — K649 Unspecified hemorrhoids: Secondary | ICD-10-CM | POA: Diagnosis not present

## 2023-04-06 DIAGNOSIS — K5900 Constipation, unspecified: Secondary | ICD-10-CM | POA: Diagnosis not present

## 2023-04-10 DIAGNOSIS — R6889 Other general symptoms and signs: Secondary | ICD-10-CM | POA: Diagnosis not present

## 2023-04-10 DIAGNOSIS — Z20822 Contact with and (suspected) exposure to covid-19: Secondary | ICD-10-CM | POA: Diagnosis not present

## 2023-04-10 DIAGNOSIS — R519 Headache, unspecified: Secondary | ICD-10-CM | POA: Diagnosis not present

## 2023-04-10 DIAGNOSIS — R070 Pain in throat: Secondary | ICD-10-CM | POA: Diagnosis not present

## 2023-04-11 DIAGNOSIS — J0100 Acute maxillary sinusitis, unspecified: Secondary | ICD-10-CM | POA: Diagnosis not present

## 2023-04-11 DIAGNOSIS — R070 Pain in throat: Secondary | ICD-10-CM | POA: Diagnosis not present

## 2023-04-11 DIAGNOSIS — U071 COVID-19: Secondary | ICD-10-CM | POA: Diagnosis not present

## 2023-04-11 DIAGNOSIS — Z20822 Contact with and (suspected) exposure to covid-19: Secondary | ICD-10-CM | POA: Diagnosis not present

## 2023-04-12 ENCOUNTER — Telehealth: Payer: Self-pay | Admitting: Urology

## 2023-04-12 NOTE — Telephone Encounter (Signed)
FYI and advised

## 2023-04-12 NOTE — Telephone Encounter (Signed)
 Returned phone call to Pt to advise her that the NP stated she could continue to take her Abx unable to leave

## 2023-04-12 NOTE — Telephone Encounter (Signed)
 Patient was seen in ER for Covid she is on Paxlovid, will it interfer with the antibiotic that you have her on for UTI?  She took the first 2 doses like you specified and is to take the last dose today, is it ok for her to take the last dose?

## 2023-04-12 NOTE — Telephone Encounter (Signed)
 See previous encounter Pt called back and made aware no mychart message sent

## 2023-04-20 DIAGNOSIS — L290 Pruritus ani: Secondary | ICD-10-CM | POA: Diagnosis not present

## 2023-04-20 DIAGNOSIS — B379 Candidiasis, unspecified: Secondary | ICD-10-CM | POA: Diagnosis not present

## 2023-04-20 DIAGNOSIS — K649 Unspecified hemorrhoids: Secondary | ICD-10-CM | POA: Diagnosis not present

## 2023-04-23 DIAGNOSIS — S30861A Insect bite (nonvenomous) of abdominal wall, initial encounter: Secondary | ICD-10-CM | POA: Diagnosis not present

## 2023-04-23 DIAGNOSIS — R509 Fever, unspecified: Secondary | ICD-10-CM | POA: Diagnosis not present

## 2023-04-26 DIAGNOSIS — M2041 Other hammer toe(s) (acquired), right foot: Secondary | ICD-10-CM | POA: Diagnosis not present

## 2023-04-26 DIAGNOSIS — M778 Other enthesopathies, not elsewhere classified: Secondary | ICD-10-CM | POA: Diagnosis not present

## 2023-04-26 DIAGNOSIS — M2042 Other hammer toe(s) (acquired), left foot: Secondary | ICD-10-CM | POA: Diagnosis not present

## 2023-04-26 DIAGNOSIS — M779 Enthesopathy, unspecified: Secondary | ICD-10-CM | POA: Diagnosis not present

## 2023-05-20 ENCOUNTER — Other Ambulatory Visit: Payer: Self-pay

## 2023-05-20 DIAGNOSIS — D509 Iron deficiency anemia, unspecified: Secondary | ICD-10-CM

## 2023-05-20 DIAGNOSIS — D508 Other iron deficiency anemias: Secondary | ICD-10-CM

## 2023-05-20 DIAGNOSIS — E538 Deficiency of other specified B group vitamins: Secondary | ICD-10-CM

## 2023-05-21 DIAGNOSIS — S1086XA Insect bite of other specified part of neck, initial encounter: Secondary | ICD-10-CM | POA: Diagnosis not present

## 2023-05-21 DIAGNOSIS — K5900 Constipation, unspecified: Secondary | ICD-10-CM | POA: Diagnosis not present

## 2023-05-21 DIAGNOSIS — R141 Gas pain: Secondary | ICD-10-CM | POA: Diagnosis not present

## 2023-05-24 ENCOUNTER — Inpatient Hospital Stay: Payer: Medicare Other | Attending: Hematology

## 2023-05-24 DIAGNOSIS — F17210 Nicotine dependence, cigarettes, uncomplicated: Secondary | ICD-10-CM | POA: Insufficient documentation

## 2023-05-24 DIAGNOSIS — D509 Iron deficiency anemia, unspecified: Secondary | ICD-10-CM

## 2023-05-24 DIAGNOSIS — E611 Iron deficiency: Secondary | ICD-10-CM | POA: Insufficient documentation

## 2023-05-24 DIAGNOSIS — E538 Deficiency of other specified B group vitamins: Secondary | ICD-10-CM

## 2023-05-24 DIAGNOSIS — D508 Other iron deficiency anemias: Secondary | ICD-10-CM

## 2023-05-24 LAB — IRON AND TIBC
Iron: 81 ug/dL (ref 28–170)
Saturation Ratios: 23 % (ref 10.4–31.8)
TIBC: 354 ug/dL (ref 250–450)
UIBC: 273 ug/dL

## 2023-05-24 LAB — CBC WITH DIFFERENTIAL/PLATELET
Abs Immature Granulocytes: 0.01 10*3/uL (ref 0.00–0.07)
Basophils Absolute: 0.1 10*3/uL (ref 0.0–0.1)
Basophils Relative: 1 %
Eosinophils Absolute: 0.1 10*3/uL (ref 0.0–0.5)
Eosinophils Relative: 2 %
HCT: 42.4 % (ref 36.0–46.0)
Hemoglobin: 14.4 g/dL (ref 12.0–15.0)
Immature Granulocytes: 0 %
Lymphocytes Relative: 34 %
Lymphs Abs: 1.9 10*3/uL (ref 0.7–4.0)
MCH: 31.2 pg (ref 26.0–34.0)
MCHC: 34 g/dL (ref 30.0–36.0)
MCV: 91.8 fL (ref 80.0–100.0)
Monocytes Absolute: 0.4 10*3/uL (ref 0.1–1.0)
Monocytes Relative: 7 %
Neutro Abs: 3.1 10*3/uL (ref 1.7–7.7)
Neutrophils Relative %: 56 %
Platelets: 215 10*3/uL (ref 150–400)
RBC: 4.62 MIL/uL (ref 3.87–5.11)
RDW: 13.2 % (ref 11.5–15.5)
WBC: 5.6 10*3/uL (ref 4.0–10.5)
nRBC: 0 % (ref 0.0–0.2)

## 2023-05-24 LAB — VITAMIN B12: Vitamin B-12: 240 pg/mL (ref 180–914)

## 2023-05-24 LAB — FERRITIN: Ferritin: 66 ng/mL (ref 11–307)

## 2023-05-25 DIAGNOSIS — B3731 Acute candidiasis of vulva and vagina: Secondary | ICD-10-CM | POA: Diagnosis not present

## 2023-05-25 DIAGNOSIS — K649 Unspecified hemorrhoids: Secondary | ICD-10-CM | POA: Diagnosis not present

## 2023-05-25 DIAGNOSIS — R350 Frequency of micturition: Secondary | ICD-10-CM | POA: Diagnosis not present

## 2023-05-26 DIAGNOSIS — B029 Zoster without complications: Secondary | ICD-10-CM | POA: Diagnosis not present

## 2023-05-26 LAB — METHYLMALONIC ACID, SERUM: Methylmalonic Acid, Quantitative: 339 nmol/L (ref 0–378)

## 2023-05-29 NOTE — Progress Notes (Unsigned)
 [facility] [address], [place] [address]   CLINIC:  Medical Oncology/Hematology  PCP:  Lenn Quint, NP [id number] [place] [address] [phone]   REASON FOR VISIT:  Follow-up for severe iron deficiency state  CURRENT THERAPY: IV iron as needed  INTERVAL HISTORY:   Ruth Terry 68 y.o. female returns for routine follow-up of severe iron deficiency state without anemia.  She was last seen by Sheril Dines PA-C on 01/31/2023.  Her last IV iron was with Feraheme  x 2 in October/November 2024.  At today's visit, she reports feeling fair.  She reports ongoing fatigue that is possibly related to caregiver fatigue and caring for her aging father and mother (recently placed in memory care).  She also continues to grieve the loss of her husband, who passed away in 03-21-22.  She denies any recent hospitalizations, surgeries, or changes in baseline health status.  She reports feeling no significant improvement after IV iron in October/November 2024, but did have three bacterial infections in the months following IV iron infusions. She has been taking vitamin B12 500 mcg 3 days weekly since January 2025.  She denies any rectal bleeding or melena.  She denies any pica, chest pain, or dyspnea on exertion, lightheadedness.  She has 75% energy and 100% appetite. She endorses that she is maintaining a stable weight.  ASSESSMENT & PLAN:  1.  Iron deficiency + B12 deficiency: - Labs from October 2024 showed severe iron deficiency state (ferritin 4, iron saturation 7%), but without anemia (Hgb 12.1) - She has not taken iron tablet as she has gastroparesis, GERD, and IBS. - No transfusion history. - Prior labs showed normal copper  levels. - Colonoscopy (12/09/2020): Few diverticula in the sigmoid colon and internal hemorrhoids. - EGD (12/09/2020): Normal esophagus, stomach and duodenum - Received IV Feraheme  x 2 in October/November 2024 - She has been taking  vitamin B12 500 mcg 3 days weekly since January 2025. - No rectal bleeding or melena.   - No ice pica.  Reports generalized weakness, fatigue, and lightheadedness. - Most recent labs (05/24/2023): Hgb 14.4/MCV 91.8 Ferritin 66, iron saturation 23% with normal TIBC 354. Vitamin B12 at 240, MMA 339 (was elevated at 411 in February 2025)  - PLAN: We will hold off on IV iron at this time.  (Marginal ferritin, but IV iron previously did not help her energy levels, but instead may have contributed to subsequent bacterial infections. - We will recheck labs with RTC in 3 months with phone visit - Recommend INCREASING vitamin B12 to 1000 mcg daily.  Recheck B12/MMA at follow-up.  2.  Social/Family History: - Semi-retired as a Personal assistant, working at home. Tobacco use of 5 cigarettes a day for 23 years.  - Father receives iron infusions at [facility]. No family history of cancer.   PLAN SUMMARY:  >> Labs in 3 months = CBC/D, ferritin, iron/TIBC, B12, MMA >> PHONE visit in 3 months (1 week after labs)     REVIEW OF SYSTEMS:   Review of Systems  Constitutional:  Positive for fatigue. Negative for appetite change, chills, diaphoresis, fever and unexpected weight change.  HENT:   Negative for lump/mass and nosebleeds.   Eyes:  Negative for eye problems.  Respiratory:  Negative for cough, hemoptysis and shortness of breath.   Cardiovascular:  Negative for chest pain, leg swelling and palpitations.  Gastrointestinal:  Negative for abdominal pain, blood in stool, constipation, diarrhea, nausea and vomiting.  Genitourinary:  Negative for hematuria.   Skin: Negative.   Neurological:  Negative for dizziness, headaches and light-headedness.  Hematological:  Does not bruise/bleed easily.     PHYSICAL EXAM:  ECOG PERFORMANCE STATUS: 1 - Symptomatic but completely ambulatory  Vitals:   05/31/23 1433  BP: 137/63  Pulse: 77  Resp: 16  Temp: (!) 97.4 F (36.3 C)  SpO2: 100%    Filed Weights    05/31/23 1433  Weight: 106 lb 11.2 oz (48.4 kg)    Physical Exam Constitutional:      Appearance: Normal appearance. She is normal weight.  Cardiovascular:     Rate and Rhythm: Normal rate.     Heart sounds: Normal heart sounds.  Pulmonary:     Breath sounds: Normal breath sounds.  Neurological:     General: No focal deficit present.     Mental Status: Mental status is at baseline.  Psychiatric:        Behavior: Behavior normal. Behavior is cooperative.    PAST MEDICAL/SURGICAL HISTORY:  Past Medical History:  Diagnosis Date   Allergy    Anal fissure    Anemia, iron deficiency 10/26/2022   Cataract    Chronic constipation    Coronary artery disease    Diverticulosis 04/20/2010   colonoscopy   Gastroparesis    GERD (gastroesophageal reflux disease)    Hemorrhoids    Hemorrhoids    Hyperlipidemia    IBS (irritable bowel syndrome)    MVP (mitral valve prolapse)    as a teenager per patient, no issues since   Osteoporosis    Polyp, stomach 04/20/2010   egd   Shingles    UTI (lower urinary tract infection)    Past Surgical History:  Procedure Laterality Date   CHOLECYSTECTOMY N/A 09/21/2021   Procedure: LAPAROSCOPIC CHOLECYSTECTOMY;  Surgeon: Lujean Sake, MD;  Location: [facility] OR;  Service: General;  Laterality: N/A;   COLONOSCOPY     CORONARY PRESSURE/FFR STUDY N/A 07/22/2021   Procedure: INTRAVASCULAR PRESSURE WIRE/FFR STUDY;  Surgeon: Arty Binning, MD;  Location: [facility] INVASIVE CV LAB;  Service: Cardiovascular;  Laterality: N/A;   ESOPHAGOGASTRODUODENOSCOPY     LEFT HEART CATH AND CORONARY ANGIOGRAPHY N/A 07/22/2021   Procedure: LEFT HEART CATH AND CORONARY ANGIOGRAPHY;  Surgeon: Arty Binning, MD;  Location: [facility] INVASIVE CV LAB;  Service: Cardiovascular;  Laterality: N/A;    SOCIAL HISTORY:  Social History   Socioeconomic History   Marital status: Married    Spouse name: Not on file   Number of children: 0   Years of education: Not on file   Highest  education level: Not on file  Occupational History   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    Employer: [employer]  Tobacco Use   Smoking status: Some Days    Current packs/day: 0.00    Average packs/day: 1 pack/day for 20.0 years (20.0 ttl pk-yrs)    Types: Cigarettes    Start date: 05/2000    Last attempt to quit: 05/2020    Years since quitting: 3.0   Smokeless tobacco: Never   Tobacco comments:    USE SMOKELESS CIGARETTES  Vaping Use   Vaping status: Some Days   Substances: Nicotine  Substance and Sexual Activity   Alcohol use: Not Currently    Comment: 1 a week, wine   Drug use: No   Sexual activity: Not Currently  Other Topics Concern   Not on file  Social  History Narrative   Not on file   Social Drivers of Health   Financial Resource Strain: Not on file  Food Insecurity: No Food Insecurity (05/26/2023)   [facility]   Hunger Vital Sign    Worried About Running Out of Food in the Last Year: Never true    Ran Out of Food in the Last Year: Never true  Transportation Needs: No Transportation Needs (05/26/2023)   Received [facility] - Transportation    Lack of Transportation (Medical): No    Lack of Transportation (Non-Medical): No  Physical Activity: Not on file  Stress: Not on file  Social Connections: Not on file  Intimate Partner Violence: Not on file    FAMILY HISTORY:  Family History  Problem Relation Age of Onset   Hypertension Mother    Diabetes Father    Heart attack Father        Age 40   Colon cancer Neg Hx    Esophageal cancer Neg Hx    Rectal cancer Neg Hx     CURRENT MEDICATIONS:  Outpatient Encounter Medications as of 05/31/2023  Medication Sig   aspirin  EC 81 MG tablet Take 1 tablet (81 mg total) by mouth daily. Swallow whole.   Cholecalciferol (VITAMIN D3 PO) Take 1 tablet by mouth daily.   docusate sodium  (COLACE) 100 MG capsule Take 1 capsule (100 mg total)  by mouth 2 (two) times daily.   estradiol  (ESTRACE ) 0.1 MG/GM vaginal cream Discard plastic applicator. Insert a blueberry size amount (approximately 1 gram) of cream on fingertip inside vagina at bedtime every night for 1 week then every other night. For long term use.   ezetimibe  (ZETIA ) 10 MG tablet Take 1 tablet (10 mg total) by mouth daily.   famotidine  (PEPCID ) 20 MG tablet One after supper   fluticasone  (FLONASE ) 50 MCG/ACT nasal spray Place 1 spray into both nostrils daily as needed for allergies or rhinitis.   fosfomycin (MONUROL ) 3 g PACK One dose = one packet (Fosfomycin 3 grams). Instructions - Day 1: Take 1 dose. Day 4: Take second dose. Day 7: Take third dose.   hydrocortisone  (ANUSOL -HC) 25 MG suppository Place 1 suppository (25 mg total) rectally at bedtime as needed for hemorrhoids or anal itching. For 5 days   hyoscyamine  (LEVSIN SL) 0.125 MG SL tablet Place 1 tablet (0.125 mg total) under the tongue every 6 (six) hours as needed.   levocetirizine (XYZAL ) 5 MG tablet Take 1 tablet (5 mg total) by mouth every evening. For allergies   Lido-PE-Mineral Oil-Petrolatum (RECTICARE ADVANCED) 5-0.25-17-39 % CREA Use three times daily as needed   Lidocaine , Anorectal, (RECTICARE) 5 % CREA Apply 1 Application topically as needed.   linaclotide  (LINZESS ) 145 MCG CAPS capsule TAKE 1 CAPSULE BY MOUTH EVERY MORNING BEFORE BREAKFAST   nitroGLYCERIN  (NITROSTAT ) 0.4 MG SL tablet Place 1 tablet (0.4 mg total) under the tongue every 5 (five) minutes as needed for chest pain.   ondansetron  (ZOFRAN ) 4 MG tablet Take 1 tablet (4 mg total) by mouth every 6 (six) hours as needed for nausea or vomiting. For nausea and vomiting   ondansetron  (ZOFRAN -ODT) 8 MG disintegrating tablet Take 8 mg by mouth every 8 (eight) hours as needed.   pantoprazole  (PROTONIX ) 40 MG tablet TAKE 1 TABLET BY MOUTH EVERY MORNING BEFORE BREAKFAST   pravastatin  (PRAVACHOL ) 10 MG tablet Take 1 tablet (10 mg total) by mouth 3 (three)  times a week. Take on Mondays,  Wednesdays, and Fridays.   psyllium (METAMUCIL) 58.6 % packet Take 1 packet by mouth every evening.   senna-docusate (SENOKOT S) 8.6-50 MG tablet Take 1-2 tablets by mouth at bedtime.   Simethicone  (PHAZYME MAXIMUM STRENGTH) 250 MG CAPS Take 250 mg by mouth 3 (three) times daily. (Patient taking differently: Take 250 mg by mouth 3 (three) times daily as needed (gas relief).)   sucralfate  (CARAFATE ) 1 GM/10ML suspension Take 10 mLs (1 g total) by mouth 3 (three) times daily.   trimethoprim  (TRIMPEX ) 100 MG tablet Take 1 tablet (100 mg total) by mouth at bedtime.   valACYclovir  (VALTREX ) 1000 MG tablet TAKE 1 TABLET (1,000 MG TOTAL) BY MOUTH AT BEDTIME.   [DISCONTINUED] AMBULATORY NON FORMULARY MEDICATION Medication Name: Domperidone 10 mg Take 1 tablet at bedtime daily. (Patient taking differently: Take 1 tablet by mouth at bedtime. Medication Name: Domperidone 10 mg Take 1 tablet at bedtime daily.)   No facility-administered encounter medications on file as of 05/31/2023.    ALLERGIES:  Allergies  Allergen Reactions   Amoxicillin -Pot Clavulanate Nausea And Vomiting   Chlordiazepoxide -Clidinium Itching    (Librax)   Ciprofloxacin  Itching    Irritates stomach   Flagyl  [Metronidazole ]     Severe yeast infection   Sulfa  Drugs Cross Reactors Nausea Only   Nitrofurantoin Monohyd Macro Rash   Omnicef  [Cefdinir ] Nausea And Vomiting    LABORATORY DATA:  I have reviewed the labs as listed.  CBC    Component Value Date/Time   WBC 5.6 05/24/2023 1426   RBC 4.62 05/24/2023 1426   HGB 14.4 05/24/2023 1426   HGB 12.4 06/14/2022 1339   HCT 42.4 05/24/2023 1426   HCT 37.0 06/14/2022 1339   PLT 215 05/24/2023 1426   PLT 211 06/14/2022 1339   MCV 91.8 05/24/2023 1426   MCV 81 06/14/2022 1339   MCH 31.2 05/24/2023 1426   MCHC 34.0 05/24/2023 1426   RDW 13.2 05/24/2023 1426   RDW 13.6 06/14/2022 1339   LYMPHSABS 1.9 05/24/2023 1426   LYMPHSABS 1.6 06/14/2022  1339   MONOABS 0.4 05/24/2023 1426   EOSABS 0.1 05/24/2023 1426   EOSABS 0.1 06/14/2022 1339   BASOSABS 0.1 05/24/2023 1426   BASOSABS 0.0 06/14/2022 1339      Latest Ref Rng & Units 08/27/2022    3:18 PM 06/04/2022    4:51 PM 12/09/2021    2:37 PM  CMP  Glucose 70 - 99 mg/dL 95  147  87   BUN 6 - 23 mg/dL 11  9  5    Creatinine 0.40 - 1.20 mg/dL 8.29  5.62  1.30   Sodium 135 - 145 mEq/L 142  137  141   Potassium 3.5 - 5.1 mEq/L 3.5  3.5  3.5   Chloride 96 - 112 mEq/L 105  103  105   CO2 19 - 32 mEq/L 28  25  29    Calcium  8.4 - 10.5 mg/dL 9.1  8.9  9.0   Total Protein 6.0 - 8.3 g/dL 6.5  6.5  6.8   Total Bilirubin 0.2 - 1.2 mg/dL 0.2  0.4  0.2   Alkaline Phos 39 - 117 U/L 75  69  73   AST 0 - 37 U/L 10  14  12    ALT 0 - 35 U/L 9  11  8      DIAGNOSTIC IMAGING:  I have independently reviewed the relevant imaging and discussed with the patient.   WRAP UP:  All questions were answered. The patient  knows to call the clinic with any problems, questions or concerns.  Medical decision making: Moderate  Time spent on visit: I spent 20 minutes counseling the patient face to face. The total time spent in the appointment was 30 minutes and more than 50% was on counseling.  Sonnie Dusky, PA-C  05/31/23 3:03 PM

## 2023-05-31 ENCOUNTER — Inpatient Hospital Stay: Payer: Medicare Other | Admitting: Physician Assistant

## 2023-05-31 DIAGNOSIS — E538 Deficiency of other specified B group vitamins: Secondary | ICD-10-CM

## 2023-05-31 DIAGNOSIS — D508 Other iron deficiency anemias: Secondary | ICD-10-CM

## 2023-05-31 DIAGNOSIS — F17210 Nicotine dependence, cigarettes, uncomplicated: Secondary | ICD-10-CM | POA: Diagnosis not present

## 2023-05-31 DIAGNOSIS — E611 Iron deficiency: Secondary | ICD-10-CM | POA: Diagnosis not present

## 2023-05-31 DIAGNOSIS — D509 Iron deficiency anemia, unspecified: Secondary | ICD-10-CM

## 2023-05-31 NOTE — Patient Instructions (Signed)
 [facility] at [facility] **VISIT SUMMARY & IMPORTANT INSTRUCTIONS **   You were seen today by Sheril Dines PA-C for your follow-up visit.    IRON DEFICIENCY Your blood levels look great! Your iron is mildly low, but we will hold off on any IV iron for now.  VITAMIN B12 DEFICIENCY Your vitamin B12 levels are still on the lower side of normal. I recommend that you increase your vitamin B12  to take  1,000 mcg daily.  This is available over-the-counter.  FOLLOW-UP APPOINTMENT: Labs and follow-up visit in 3 months  ** Thank you for trusting me with your healthcare!  I strive to provide all of my patients with quality care at each visit.  If you receive a survey for this visit, I would be so grateful to you for taking the time to provide feedback.  Thank you in advance!  ~ Khilee Hendricksen                   Dr. Paulett Boros   &   Sheril Dines, PA-C   - - - - - - - - - - - - - - - - - -    Thank you for choosing [facility] at [facility] to provide your oncology and hematology care.  To afford each patient quality time with our provider, please arrive at least 15 minutes before your scheduled appointment time.   If you have a lab appointment with the Cancer Center please come in thru the Main Entrance and check in at the main information desk.  You need to re-schedule your appointment should you arrive 10 or more minutes late.  We strive to give you quality time with our providers, and arriving late affects you and other patients whose appointments are after yours.  Also, if you no show three or more times for appointments you may be dismissed from the clinic at the providers discretion.     Again, thank you for choosing [facility].  Our hope is that these requests will decrease the amount of time that you wait before being seen by our physicians.       _____________________________________________________________  Should  you have questions after your visit to [facility], please contact our office at [phone] and follow the prompts.  Our office hours are 8:00 a.m. and 4:30 p.m. Monday - Friday.  Please note that voicemails left after 4:00 p.m. may not be returned until the following business day.  We are closed weekends and major holidays.  You do have access to a nurse 24-7, just call the main number to the clinic [phone] and do not press any options, hold on the line and a nurse will answer the phone.    For prescription refill requests, have your pharmacy contact our office and allow 72 hours.

## 2023-06-13 DIAGNOSIS — R309 Painful micturition, unspecified: Secondary | ICD-10-CM | POA: Diagnosis not present

## 2023-06-30 DIAGNOSIS — M5431 Sciatica, right side: Secondary | ICD-10-CM | POA: Diagnosis not present

## 2023-06-30 DIAGNOSIS — M5432 Sciatica, left side: Secondary | ICD-10-CM | POA: Diagnosis not present

## 2023-06-30 DIAGNOSIS — R350 Frequency of micturition: Secondary | ICD-10-CM | POA: Diagnosis not present

## 2023-07-15 DIAGNOSIS — M51369 Other intervertebral disc degeneration, lumbar region without mention of lumbar back pain or lower extremity pain: Secondary | ICD-10-CM | POA: Diagnosis not present

## 2023-07-26 DIAGNOSIS — S93332D Other subluxation of left foot, subsequent encounter: Secondary | ICD-10-CM | POA: Diagnosis not present

## 2023-07-26 DIAGNOSIS — M2042 Other hammer toe(s) (acquired), left foot: Secondary | ICD-10-CM | POA: Diagnosis not present

## 2023-07-26 DIAGNOSIS — S93331D Other subluxation of right foot, subsequent encounter: Secondary | ICD-10-CM | POA: Diagnosis not present

## 2023-07-26 DIAGNOSIS — M5459 Other low back pain: Secondary | ICD-10-CM | POA: Diagnosis not present

## 2023-07-26 DIAGNOSIS — M2041 Other hammer toe(s) (acquired), right foot: Secondary | ICD-10-CM | POA: Diagnosis not present

## 2023-07-29 DIAGNOSIS — M5459 Other low back pain: Secondary | ICD-10-CM | POA: Diagnosis not present

## 2023-08-03 DIAGNOSIS — K649 Unspecified hemorrhoids: Secondary | ICD-10-CM | POA: Diagnosis not present

## 2023-08-03 DIAGNOSIS — J301 Allergic rhinitis due to pollen: Secondary | ICD-10-CM | POA: Diagnosis not present

## 2023-08-03 DIAGNOSIS — R300 Dysuria: Secondary | ICD-10-CM | POA: Diagnosis not present

## 2023-08-05 DIAGNOSIS — M5459 Other low back pain: Secondary | ICD-10-CM | POA: Diagnosis not present

## 2023-08-08 DIAGNOSIS — M5459 Other low back pain: Secondary | ICD-10-CM | POA: Diagnosis not present

## 2023-08-08 DIAGNOSIS — S00462A Insect bite (nonvenomous) of left ear, initial encounter: Secondary | ICD-10-CM | POA: Diagnosis not present

## 2023-08-09 ENCOUNTER — Other Ambulatory Visit: Payer: Self-pay | Admitting: Physician Assistant

## 2023-08-12 DIAGNOSIS — S00469D Insect bite (nonvenomous) of unspecified ear, subsequent encounter: Secondary | ICD-10-CM | POA: Diagnosis not present

## 2023-08-12 DIAGNOSIS — B029 Zoster without complications: Secondary | ICD-10-CM | POA: Diagnosis not present

## 2023-08-15 DIAGNOSIS — M5459 Other low back pain: Secondary | ICD-10-CM | POA: Diagnosis not present

## 2023-08-17 ENCOUNTER — Other Ambulatory Visit (HOSPITAL_COMMUNITY): Payer: Self-pay

## 2023-08-17 DIAGNOSIS — M545 Low back pain, unspecified: Secondary | ICD-10-CM

## 2023-08-19 DIAGNOSIS — R350 Frequency of micturition: Secondary | ICD-10-CM | POA: Diagnosis not present

## 2023-08-19 DIAGNOSIS — K649 Unspecified hemorrhoids: Secondary | ICD-10-CM | POA: Diagnosis not present

## 2023-08-20 ENCOUNTER — Ambulatory Visit (HOSPITAL_COMMUNITY)
Admission: RE | Admit: 2023-08-20 | Discharge: 2023-08-20 | Disposition: A | Discharge status: Home / Self Care | Source: Ambulatory Visit

## 2023-08-20 DIAGNOSIS — M545 Low back pain, unspecified: Secondary | ICD-10-CM | POA: Diagnosis not present

## 2023-08-20 DIAGNOSIS — G8929 Other chronic pain: Secondary | ICD-10-CM | POA: Diagnosis not present

## 2023-08-25 DIAGNOSIS — M545 Low back pain, unspecified: Secondary | ICD-10-CM | POA: Diagnosis not present

## 2023-08-25 DIAGNOSIS — M479 Spondylosis, unspecified: Secondary | ICD-10-CM | POA: Diagnosis not present

## 2023-08-26 ENCOUNTER — Encounter: Payer: Self-pay | Admitting: Radiology

## 2023-08-26 DIAGNOSIS — K219 Gastro-esophageal reflux disease without esophagitis: Secondary | ICD-10-CM | POA: Diagnosis not present

## 2023-08-26 DIAGNOSIS — F418 Other specified anxiety disorders: Secondary | ICD-10-CM | POA: Diagnosis not present

## 2023-08-26 DIAGNOSIS — I10 Essential (primary) hypertension: Secondary | ICD-10-CM | POA: Diagnosis not present

## 2023-08-26 DIAGNOSIS — E785 Hyperlipidemia, unspecified: Secondary | ICD-10-CM | POA: Diagnosis not present

## 2023-08-26 DIAGNOSIS — W57XXXA Bitten or stung by nonvenomous insect and other nonvenomous arthropods, initial encounter: Secondary | ICD-10-CM | POA: Diagnosis not present

## 2023-08-31 ENCOUNTER — Inpatient Hospital Stay: Attending: Hematology

## 2023-08-31 DIAGNOSIS — E611 Iron deficiency: Secondary | ICD-10-CM | POA: Diagnosis not present

## 2023-08-31 DIAGNOSIS — D509 Iron deficiency anemia, unspecified: Secondary | ICD-10-CM

## 2023-08-31 DIAGNOSIS — E538 Deficiency of other specified B group vitamins: Secondary | ICD-10-CM

## 2023-08-31 DIAGNOSIS — D508 Other iron deficiency anemias: Secondary | ICD-10-CM

## 2023-08-31 LAB — CBC WITH DIFFERENTIAL/PLATELET
Abs Immature Granulocytes: 0.02 K/uL (ref 0.00–0.07)
Basophils Absolute: 0 K/uL (ref 0.0–0.1)
Basophils Relative: 1 %
Eosinophils Absolute: 0.1 K/uL (ref 0.0–0.5)
Eosinophils Relative: 2 %
HCT: 43.5 % (ref 36.0–46.0)
Hemoglobin: 14.6 g/dL (ref 12.0–15.0)
Immature Granulocytes: 0 %
Lymphocytes Relative: 30 %
Lymphs Abs: 1.6 K/uL (ref 0.7–4.0)
MCH: 30.9 pg (ref 26.0–34.0)
MCHC: 33.6 g/dL (ref 30.0–36.0)
MCV: 92.2 fL (ref 80.0–100.0)
Monocytes Absolute: 0.4 K/uL (ref 0.1–1.0)
Monocytes Relative: 8 %
Neutro Abs: 3.2 K/uL (ref 1.7–7.7)
Neutrophils Relative %: 59 %
Platelets: 218 K/uL (ref 150–400)
RBC: 4.72 MIL/uL (ref 3.87–5.11)
RDW: 13.1 % (ref 11.5–15.5)
WBC: 5.4 K/uL (ref 4.0–10.5)
nRBC: 0 % (ref 0.0–0.2)

## 2023-08-31 LAB — IRON AND TIBC
Iron: 61 ug/dL (ref 28–170)
Saturation Ratios: 16 % (ref 10.4–31.8)
TIBC: 374 ug/dL (ref 250–450)
UIBC: 313 ug/dL

## 2023-08-31 LAB — VITAMIN B12: Vitamin B-12: 216 pg/mL (ref 180–914)

## 2023-08-31 LAB — FERRITIN: Ferritin: 44 ng/mL (ref 11–307)

## 2023-09-01 ENCOUNTER — Other Ambulatory Visit (HOSPITAL_COMMUNITY): Payer: Self-pay | Admitting: Internal Medicine

## 2023-09-01 DIAGNOSIS — M810 Age-related osteoporosis without current pathological fracture: Secondary | ICD-10-CM

## 2023-09-02 ENCOUNTER — Ambulatory Visit (HOSPITAL_COMMUNITY)
Admission: RE | Admit: 2023-09-02 | Discharge: 2023-09-02 | Disposition: A | Discharge status: Home / Self Care | Source: Ambulatory Visit | Attending: Internal Medicine | Admitting: Internal Medicine

## 2023-09-02 DIAGNOSIS — M810 Age-related osteoporosis without current pathological fracture: Secondary | ICD-10-CM | POA: Insufficient documentation

## 2023-09-02 DIAGNOSIS — Z780 Asymptomatic menopausal state: Secondary | ICD-10-CM | POA: Diagnosis not present

## 2023-09-06 LAB — METHYLMALONIC ACID, SERUM: Methylmalonic Acid, Quantitative: 574 nmol/L — ABNORMAL HIGH (ref 0–378)

## 2023-09-06 NOTE — Progress Notes (Unsigned)
 VIRTUAL VISIT via TELEPHONE NOTE [facility]   I connected with Ruth Terry  on 09/07/23 at  3:50 PM by telephone and verified that I am speaking with the correct person using two identifiers.  Location: Patient: Home Provider: [facility]   I discussed the limitations, risks, security and privacy concerns of performing an evaluation and management service by telephone and the availability of in person appointments. I also discussed with the patient that there may be a patient responsible charge related to this service. The patient expressed understanding and agreed to proceed.  REASON FOR VISIT:  Follow-up for severe iron deficiency state   CURRENT THERAPY: IV iron as needed   INTERVAL HISTORY:   Ruth Terry 68 y.o. female is contacted today for routine follow-up of severe iron deficiency state without anemia.   She was last seen by Pleasant Barefoot PA-C on 05/31/2023.   Her last IV iron was with Feraheme  x 2 in October/November 2024.   At today's visit, she reports feeling fair. She reports new diagnosis of hypertension and joint facet spine disease.    She reports feeling no significant improvement after IV iron in October/November 2024, but did have three bacterial infections in the months following IV iron infusions. She had UTI about 2 weeks ago. She has been taking vitamin B12 1000 mcg daily since last visit.  She reports mild fatigue, energy overall at baseline. She denies any rectal bleeding or melena.   She denies any pica, chest pain, or dyspnea on exertion, lightheadedness.   She has 70% energy and 100% appetite.  She endorses that she is maintaining a stable weight.   ASSESSMENT & PLAN:  1.  Iron deficiency + B12 deficiency: - Labs from October 2024 showed severe iron deficiency state (ferritin 4, iron saturation 7%), but without anemia (Hgb 12.1) - She has not taken iron tablet as she has gastroparesis, GERD, and IBS. - No  transfusion history. - Prior labs showed normal copper  levels. - Colonoscopy (12/09/2020): Few diverticula in the sigmoid colon and internal hemorrhoids. - EGD (12/09/2020): Normal esophagus, stomach and duodenum - Received IV Feraheme  x 2 in October/November 2024, experienced bacterial infection x3 in the months following iron infusion - She has been taking vitamin B12 1000 mcg daily weekly since May 2025. - No rectal bleeding or melena.   - No ice pica.  Reports some mild fatigue - Most recent labs (08/31/2023): Hgb 14.6/MCV 92.2 Ferritin 44, iron saturation 16% with normal TIBC 374. Vitamin B12 at 216, MMA elevated at 574 - PLAN: She prefers to hold off on IV iron for now, due to multiple infections following last IV iron. -- Recommend trial of ferrous bisglycinate MWF, if tolerated. - Switch to B12 injections - weekly x4, then monthly - We will recheck labs with [facility] in 4 months with phone visit   2.  Social/Family History: - Semi-retired as a Personal assistant, working at home. Tobacco use of 5 cigarettes a day for 23 years.  - Father receives iron infusions at [facility]. No family history of cancer.    PLAN SUMMARY:  >> B12 injections - weekly x 4, then monthly thereafter >> Labs in 4 months = CBC/D, ferritin, iron/TIBC, B12, MMA >> PHONE visit in 4 months (1 week after labs)   **Last office visit 05/31/2023     REVIEW OF SYSTEMS:   Review of Systems  Constitutional:  Positive for malaise/fatigue. Negative for chills, diaphoresis, fever and weight loss.  Respiratory:  Negative for cough and shortness of breath.   Cardiovascular:  Negative for chest pain and palpitations.  Gastrointestinal:  Negative for abdominal pain, blood in stool, melena, nausea and vomiting.  Musculoskeletal:  Positive for back pain.  Neurological:  Negative for dizziness and headaches.     PHYSICAL EXAM: (per limitations of virtual telephone visit)  The patient is alert and oriented x 3, exhibiting adequate  mentation, good mood, and ability to speak in full sentences and execute sound judgement.  WRAP UP:   I discussed the assessment and treatment plan with the patient. The patient was provided an opportunity to ask questions and all were answered. The patient agreed with the plan and demonstrated an understanding of the instructions.   The patient was advised to call back or seek an in-person evaluation if the symptoms worsen or if the condition fails to improve as anticipated.  I provided 22 minutes of non-face-to-face time during this encounter, including > 10 minutes o medical discussion.  Pleasant Ruth Barefoot, PA-C 09/07/23 4:20 PM

## 2023-09-07 ENCOUNTER — Inpatient Hospital Stay: Attending: Hematology | Admitting: Physician Assistant

## 2023-09-07 ENCOUNTER — Encounter: Payer: Self-pay | Admitting: Physician Assistant

## 2023-09-07 DIAGNOSIS — D509 Iron deficiency anemia, unspecified: Secondary | ICD-10-CM

## 2023-09-07 DIAGNOSIS — E538 Deficiency of other specified B group vitamins: Secondary | ICD-10-CM | POA: Insufficient documentation

## 2023-09-07 DIAGNOSIS — D508 Other iron deficiency anemias: Secondary | ICD-10-CM

## 2023-09-07 HISTORY — DX: Deficiency of other specified B group vitamins: E53.8

## 2023-09-09 ENCOUNTER — Ambulatory Visit

## 2023-09-09 VITALS — BP 149/65 | HR 102 | Temp 98.2°F | Resp 20

## 2023-09-09 DIAGNOSIS — E538 Deficiency of other specified B group vitamins: Secondary | ICD-10-CM

## 2023-09-09 MED ORDER — CYANOCOBALAMIN 1000 MCG/ML IJ SOLN
1000.0000 ug | Freq: Once | INTRAMUSCULAR | Status: AC
  Administered 2023-09-09: 1000 ug via INTRAMUSCULAR
  Filled 2023-09-09: qty 1

## 2023-09-09 NOTE — Patient Instructions (Signed)
 [facility] [place] - [facility]  Discharge Instructions: Thank you for choosing [facility] to provide your oncology and hematology care.  If you have a lab appointment with the Cancer Center - please note that after April 8th, 2024, all labs will be drawn in the cancer center.  You do not have to check in or register with the main entrance as you have in the past but will complete your check-in in the cancer center.  Wear comfortable clothing and clothing appropriate for easy access to any Portacath or PICC line.   We strive to give you quality time with your provider. You may need to reschedule your appointment if you arrive late (15 or more minutes).  Arriving late affects you and other patients whose appointments are after yours.  Also, if you miss three or more appointments without notifying the office, you may be dismissed from the clinic at the provider's discretion.      For prescription refill requests, have your pharmacy contact our office and allow 72 hours for refills to be completed.    Today you received the following B12 injection.   Vitamin B12 Injection What is this medication? Vitamin B12 (VAHY tuh min B12) prevents and treats low vitamin B12 levels in your body. It is used in people who do not get enough vitamin B12 from their diet or when their digestive tract does not absorb enough. Vitamin B12 plays an important role in maintaining the health of your nervous system and red blood cells. This medicine may be used for other purposes; ask your health care provider or pharmacist if you have questions. COMMON BRAND NAME(S): B-12 Compliance Kit, B-12 Injection Kit, Cyomin, Dodex, LA-12, Nutri-Twelve, Physicians EZ Use B-12, Primabalt, Vitamin Deficiency Injectable System - B12 What should I tell my care team before I take this medication? They need to know if you have any of these conditions: Kidney disease Leber's  disease Megaloblastic anemia An unusual or allergic reaction to cyanocobalamin, cobalt, other medications, foods, dyes, or preservatives Pregnant or trying to get pregnant Breast-feeding How should I use this medication? This medication is injected into a muscle or deeply under the skin. It is usually given in a clinic or care team's office. However, your care team may teach you how to inject yourself. Follow all instructions. Talk to your care team about the use of this medication in children. Special care may be needed. Overdosage: If you think you have taken too much of this medicine contact a poison control center or emergency room at once. NOTE: This medicine is only for you. Do not share this medicine with others. What if I miss a dose? If you are given your dose at a clinic or care team's office, call to reschedule your appointment. If you give your own injections, and you miss a dose, take it as soon as you can. If it is almost time for your next dose, take only that dose. Do not take double or extra doses. What may interact with this medication? Alcohol Colchicine This list may not describe all possible interactions. Give your health care provider a list of all the medicines, herbs, non-prescription drugs, or dietary supplements you use. Also tell them if you smoke, drink alcohol, or use illegal drugs. Some items may interact with your medicine. What should I watch for while using this medication? Visit your care team regularly. You may need blood work done while you are  taking this medication. You may need to follow a special diet. Talk to your care team. Limit your alcohol intake and avoid smoking to get the best benefit. What side effects may I notice from receiving this medication? Side effects that you should report to your care team as soon as possible: Allergic reactions--skin rash, itching, hives, swelling of the face, lips, tongue, or throat Swelling of the ankles, hands, or  feet Trouble breathing Side effects that usually do not require medical attention (report to your care team if they continue or are bothersome): Diarrhea This list may not describe all possible side effects. Call your doctor for medical advice about side effects. You may report side effects to FDA at 1-800-FDA-1088. Where should I keep my medication? Keep out of the reach of children. Store at room temperature between 15 and 30 degrees C (59 and 85 degrees F). Protect from light. Throw away any unused medication after the expiration date. NOTE: This sheet is a summary. It may not cover all possible information. If you have questions about this medicine, talk to your doctor, pharmacist, or health care provider.  2024 Elsevier/Gold Standard (2020-09-02 00:00:00)    To help prevent nausea and vomiting after your treatment, we encourage you to take your nausea medication as directed.  BELOW ARE SYMPTOMS THAT SHOULD BE REPORTED IMMEDIATELY: *FEVER GREATER THAN 100.4 F (38 C) OR HIGHER *CHILLS OR SWEATING *NAUSEA AND VOMITING THAT IS NOT CONTROLLED WITH YOUR NAUSEA MEDICATION *UNUSUAL SHORTNESS OF BREATH *UNUSUAL BRUISING OR BLEEDING *URINARY PROBLEMS (pain or burning when urinating, or frequent urination) *BOWEL PROBLEMS (unusual diarrhea, constipation, pain near the anus) TENDERNESS IN MOUTH AND THROAT WITH OR WITHOUT PRESENCE OF ULCERS (sore throat, sores in mouth, or a toothache) UNUSUAL RASH, SWELLING OR PAIN  UNUSUAL VAGINAL DISCHARGE OR ITCHING   Items with * indicate a potential emergency and should be followed up as soon as possible or go to the Emergency Department if any problems should occur.  Please show the CHEMOTHERAPY ALERT CARD or IMMUNOTHERAPY ALERT CARD at check-in to the Emergency Department and triage nurse.  Should you have questions after your visit or need to cancel or reschedule your appointment, please contact [facility] [place] - A DEPT OF [facility] [phone]  and follow the prompts.  Office hours are 8:00 a.m. to 4:30 p.m. Monday - Friday. Please note that voicemails left after 4:00 p.m. may not be returned until the following business day.  We are closed weekends and major holidays. You have access to a nurse at all times for urgent questions. Please call the main number to the clinic [phone] and follow the prompts.  For any non-urgent questions, you may also contact your provider using MyChart. We now offer e-Visits for anyone 23 and older to request care online for non-urgent symptoms. For details visit [email or web address].   Also download the MyChart app! Go to the app store, search "MyChart", open the app, select [place], and log in with your MyChart username and password.

## 2023-09-09 NOTE — Progress Notes (Signed)
Patient presents today for B12 injection. Patient tolerated injection in right deltoid with no complaints voiced.  Site clean and dry with no bruising or swelling noted.  No complaints of pain.  Discharged with vital signs stable and no signs or symptoms of distress noted.

## 2023-09-12 DIAGNOSIS — R350 Frequency of micturition: Secondary | ICD-10-CM | POA: Diagnosis not present

## 2023-09-12 DIAGNOSIS — B029 Zoster without complications: Secondary | ICD-10-CM | POA: Diagnosis not present

## 2023-09-12 DIAGNOSIS — K649 Unspecified hemorrhoids: Secondary | ICD-10-CM | POA: Diagnosis not present

## 2023-09-12 NOTE — Progress Notes (Signed)
 Tobacco Use: High Risk (09/12/2023)   Patient History   . Smoking Tobacco Use: Some Days   . Smokeless Tobacco Use: Never   . Passive Exposure: Current

## 2023-09-12 NOTE — Progress Notes (Signed)
 Assessment and Plan:   Assessment & Plan Herpes zoster without complication  Orders: .  valACYclovir  (VALTREX ) 1000 MG tablet; Take 1 tablet (1,000 mg total) by mouth Three (3) times a day for 7 days.  Urinary frequency  Orders: .  POCT urinalysis dipstick .  Urine Culture  Hemorrhoids, unspecified hemorrhoid type       Assessment/Plan:   Shingles (herpes zoster), right side Recurrent shingles outbreak on the right side, painful but not severe. Stress identified as a potential trigger. Previous herpes swab negative for herpes simplex virus. Incorrect self-initiated Valtrex  dosage. - Prescribed Valtrex  1000 mg TID for 7 days. - Advised follow-up with primary care on Thursday for maintenance therapy discussion. - Continue lidocaine  for pain relief. - Advised using ice for additional pain relief.  Hemorrhoids Large hemorrhoids on the right side, not engorged or concerning. Pain and discomfort exacerbated by proximity to shingles lesions. - Continue lidocaine  for hemorrhoid pain relief. - Advised using ice for additional relief. - Follow up with gastroenterologist in October.      The following portions of the patient's history were reviewed and updated as appropriate: allergies, current medications, past family history, past medical history, past social history, past surgical history and problem list.   Subjective:   Patient ID: Ruth Terry is a 68 y.o. female Chief Complaint  Patient presents with  . Rash    She thinks it is shingles at her vaginal area--  . Hemorrhoids    Rash This is a new problem. The current episode started in the past 7 days. The problem is unchanged. The affected locations include the groin. The rash is characterized by blistering and burning. She was exposed to nothing. Pertinent negatives include no diarrhea, facial edema, fever, joint pain or shortness of breath. Treatments tried: Valtrex .    ROS: Negative unless otherwise noted in  HPI.  Objective:   Vital Signs:  BP 147/85 (BP Site: L Arm, BP Position: Sitting, BP Cuff Size: Large)   Pulse 98   Temp 36.8 C (98.3 F) (Oral)   Resp 16   Ht 152.4 cm (5')   Wt 49.4 kg (109 lb)   SpO2 98%   BMI 21.29 kg/m   Body mass index is 21.29 kg/m. No LMP recorded. Patient is postmenopausal.  Results for orders placed or performed in visit on 09/12/23  POCT urinalysis dipstick  Result Value Ref Range   Color, UA Light Yellow    Clarity, UA Yellow    Glucose, UA Negative Negative   Bilirubin, UA Negative Negative   Ketones, POC Negative Negative   Spec Grav, UA <=1.005 1.005 - 1.030   Blood, UA Negative Negative   pH, UA 5.5 5.0 - 9.0   Protein, UA Negative Negative   Urobilinogen, UA 0.2 E.U./dL Negative (0.2 mg/dL)   Leukocytes, UA Negative Negative   Nitrite, UA Negative Negative   STRIP LOT NUMBER [id number]    STRIP LOT EXPIRATION [id number]      Physical Exam Vitals and nursing note reviewed.  Constitutional:      General: She is not in acute distress.    Appearance: Normal appearance. She is normal weight. She is not ill-appearing or toxic-appearing.  HENT:     Head: Normocephalic.     Right Ear: Tympanic membrane, ear canal and external ear normal.     Left Ear: Tympanic membrane, ear canal and external ear normal.     Nose: Nose normal.     Mouth/Throat:  Mouth: Mucous membranes are moist.     Pharynx: Oropharynx is clear.  Eyes:     Extraocular Movements: Extraocular movements intact.     Conjunctiva/sclera: Conjunctivae normal.     Pupils: Pupils are equal, round, and reactive to light.  Cardiovascular:     Rate and Rhythm: Normal rate and regular rhythm.     Heart sounds: Normal heart sounds.  Pulmonary:     Effort: Pulmonary effort is normal. No respiratory distress.     Breath sounds: Normal breath sounds.  Abdominal:     General: Abdomen is flat.     Palpations: Abdomen is soft.  Genitourinary:    Rectum: External hemorrhoid  present.  Musculoskeletal:        General: Normal range of motion.     Cervical back: Normal range of motion.  Skin:    General: Skin is warm and dry.     Findings: Rash present. Rash is crusting and vesicular.  Neurological:     General: No focal deficit present.     Mental Status: She is alert and oriented to person, place, and time. Mental status is at baseline.  Psychiatric:        Mood and Affect: Mood normal.        Behavior: Behavior normal.        Thought Content: Thought content normal.        Judgment: Judgment normal.       PHQ-2 Score: 0  PHQ-9 Score:    Screening complete, no depression identified / no further action needed today   Follow-up as Needed , Follow-up with PCP, and Follow-up with Specialist   The use of abridge was used to help in the completion of this note. Patient was educated and verbalized consent of its use.   Disposition Upon Discharge:  1.  Routine symptom specific, illness specific and/or disease specific instructions were discussed with the patient and/or caregiver at length.  2.  The differential diagnosis for the patient's specific symptoms were reviewed and discussed with the patient/caregiver at length; the patient/caregiver expressed understanding of all discussed and their relevant questions were all satisfactorily answered.  3.  Return to care should the presenting symptoms recur, persist, or worsen in any way; or in the alternative, if new symptoms or complaints develop.  4.  The patient and any family present were given verbal and/or written discharge instructions as clinically indicated and appropriate.  5.  Continue OTC medications as needed and tolerated for control of the currently reported symptoms, if no conflict with the currently prescribed medications.

## 2023-09-14 DIAGNOSIS — E785 Hyperlipidemia, unspecified: Secondary | ICD-10-CM | POA: Diagnosis not present

## 2023-09-14 DIAGNOSIS — I10 Essential (primary) hypertension: Secondary | ICD-10-CM | POA: Diagnosis not present

## 2023-09-14 DIAGNOSIS — F418 Other specified anxiety disorders: Secondary | ICD-10-CM | POA: Diagnosis not present

## 2023-09-14 DIAGNOSIS — K219 Gastro-esophageal reflux disease without esophagitis: Secondary | ICD-10-CM | POA: Diagnosis not present

## 2023-09-16 ENCOUNTER — Inpatient Hospital Stay

## 2023-09-16 VITALS — BP 146/75 | HR 95 | Temp 97.4°F | Resp 19

## 2023-09-16 DIAGNOSIS — E538 Deficiency of other specified B group vitamins: Secondary | ICD-10-CM

## 2023-09-16 MED ORDER — CYANOCOBALAMIN 1000 MCG/ML IJ SOLN
1000.0000 ug | Freq: Once | INTRAMUSCULAR | Status: AC
  Administered 2023-09-16: 1000 ug via INTRAMUSCULAR
  Filled 2023-09-16: qty 1

## 2023-09-16 NOTE — Progress Notes (Signed)
 Patient tolerated injection with no complaints voiced. Site clean and dry with no bruising or swelling noted at site. See MAR for details. Band aid applied.  Patient stable during and after injection. VSS with discharge and left in satisfactory condition with no s/s of distress noted.

## 2023-09-16 NOTE — Patient Instructions (Signed)
 [facility] [place] [facility]  Discharge Instructions: Thank you for choosing [facility] to provide your oncology and hematology care.  If you have a lab appointment with the Cancer Center - please note that after April 8th, 2024, all labs will be drawn in the cancer center.  You do not have to check in or register with the main entrance as you have in the past but will complete your check-in in the cancer center.  Wear comfortable clothing and clothing appropriate for easy access to any Portacath or PICC line.   We strive to give you quality time with your provider. You may need to reschedule your appointment if you arrive late (15 or more minutes).  Arriving late affects you and other patients whose appointments are after yours.  Also, if you miss three or more appointments without notifying the office, you may be dismissed from the clinic at the provider's discretion.      For prescription refill requests, have your pharmacy contact our office and allow 72 hours for refills to be completed.    Today you received the following B12 return as scheduled.   To help prevent nausea and vomiting after your treatment, we encourage you to take your nausea medication as directed.  BELOW ARE SYMPTOMS THAT SHOULD BE REPORTED IMMEDIATELY: *FEVER GREATER THAN 100.4 F (38 C) OR HIGHER *CHILLS OR SWEATING *NAUSEA AND VOMITING THAT IS NOT CONTROLLED WITH YOUR NAUSEA MEDICATION *UNUSUAL SHORTNESS OF BREATH *UNUSUAL BRUISING OR BLEEDING *URINARY PROBLEMS (pain or burning when urinating, or frequent urination) *BOWEL PROBLEMS (unusual diarrhea, constipation, pain near the anus) TENDERNESS IN MOUTH AND THROAT WITH OR WITHOUT PRESENCE OF ULCERS (sore throat, sores in mouth, or a toothache) UNUSUAL RASH, SWELLING OR PAIN  UNUSUAL VAGINAL DISCHARGE OR ITCHING   Items with * indicate a potential emergency and should be followed up as soon as possible or go to  the Emergency Department if any problems should occur.  Please show the CHEMOTHERAPY ALERT CARD or IMMUNOTHERAPY ALERT CARD at check-in to the Emergency Department and triage nurse.  Should you have questions after your visit or need to cancel or reschedule your appointment, please contact [facility] [place] - A DEPT OF [facility]ideg[facility] [phone]  and follow the prompts.  Office hours are 8:00 a.m. to 4:30 p.m. Monday - Friday. Please note that voicemails left after 4:00 p.m. may not be returned until the following business day.  We are closed weekends and major holidays. You have access to a nurse at all times for urgent questions. Please call the main number to the clinic [phone] and follow the prompts.  For any non-urgent questions, you may also contact your provider using MyChart. We now offer e-Visits for anyone 33 and older to request care online for non-urgent symptoms. For details visit [email or web address].   Also download the MyChart app! Go to the app store, search "MyChart", open the app, select [facility], and log in with your MyChart username and password.

## 2023-09-23 ENCOUNTER — Inpatient Hospital Stay

## 2023-09-23 VITALS — BP 144/69 | HR 96 | Temp 97.5°F | Resp 18

## 2023-09-23 DIAGNOSIS — E538 Deficiency of other specified B group vitamins: Secondary | ICD-10-CM

## 2023-09-23 MED ORDER — CYANOCOBALAMIN 1000 MCG/ML IJ SOLN
1000.0000 ug | Freq: Once | INTRAMUSCULAR | Status: AC
  Administered 2023-09-23: 1000 ug via INTRAMUSCULAR
  Filled 2023-09-23: qty 1

## 2023-09-23 NOTE — Patient Instructions (Signed)
 [facility] [place] - A DEPT OF [facility]  Discharge Instructions: Thank you for choosing [facility] to provide your oncology and hematology care.  If you have a lab appointment with the Cancer Center - please note that after April 8th, 2024, all labs will be drawn in the cancer center.  You do not have to check in or register with the main entrance as you have in the past but will complete your check-in in the cancer center.  Wear comfortable clothing and clothing appropriate for easy access to any Portacath or PICC line.   We strive to give you quality time with your provider. You may need to reschedule your appointment if you arrive late (15 or more minutes).  Arriving late affects you and other patients whose appointments are after yours.  Also, if you miss three or more appointments without notifying the office, you may be dismissed from the clinic at the provider's discretion.      For prescription refill requests, have your pharmacy contact our office and allow 72 hours for refills to be completed.    Today you received B12 injection     BELOW ARE SYMPTOMS THAT SHOULD BE REPORTED IMMEDIATELY: *FEVER GREATER THAN 100.4 F (38 C) OR HIGHER *CHILLS OR SWEATING *NAUSEA AND VOMITING THAT IS NOT CONTROLLED WITH YOUR NAUSEA MEDICATION *UNUSUAL SHORTNESS OF BREATH *UNUSUAL BRUISING OR BLEEDING *URINARY PROBLEMS (pain or burning when urinating, or frequent urination) *BOWEL PROBLEMS (unusual diarrhea, constipation, pain near the anus) TENDERNESS IN MOUTH AND THROAT WITH OR WITHOUT PRESENCE OF ULCERS (sore throat, sores in mouth, or a toothache) UNUSUAL RASH, SWELLING OR PAIN  UNUSUAL VAGINAL DISCHARGE OR ITCHING   Items with * indicate a potential emergency and should be followed up as soon as possible or go to the Emergency Department if any problems should occur.  Please show the CHEMOTHERAPY ALERT CARD or IMMUNOTHERAPY ALERT CARD at check-in to  the Emergency Department and triage nurse.  Should you have questions after your visit or need to cancel or reschedule your appointment, please contact [facility] [place] - A DEPT OF Eligha Brideg[facility] [phone]  and follow the prompts.  Office hours are 8:00 a.m. to 4:30 p.m. Monday - Friday. Please note that voicemails left after 4:00 p.m. may not be returned until the following business day.  We are closed weekends and major holidays. You have access to a nurse at all times for urgent questions. Please call the main number to the clinic [phone] and follow the prompts.  For any non-urgent questions, you may also contact your provider using MyChart. We now offer e-Visits for anyone 57 and older to request care online for non-urgent symptoms. For details visit [email or web address].   Also download the MyChart app! Go to the app store, search "MyChart", open the app, select [place], and log in with your MyChart username and password.

## 2023-09-23 NOTE — Progress Notes (Signed)
 Ruth Terry presents today for injection per the provider's orders.  B12 administration without incident; injection site WNL; see MAR for injection details.  Patient tolerated procedure well and without incident.  No questions or complaints noted at this time.   Discharged from clinic ambulatory in stable condition. Alert and oriented x 3. F/U with [facility] as scheduled.

## 2023-09-24 DIAGNOSIS — B3731 Acute candidiasis of vulva and vagina: Secondary | ICD-10-CM | POA: Diagnosis not present

## 2023-09-24 DIAGNOSIS — K649 Unspecified hemorrhoids: Secondary | ICD-10-CM | POA: Diagnosis not present

## 2023-09-24 DIAGNOSIS — N9489 Other specified conditions associated with female genital organs and menstrual cycle: Secondary | ICD-10-CM | POA: Diagnosis not present

## 2023-09-30 ENCOUNTER — Inpatient Hospital Stay

## 2023-09-30 VITALS — BP 150/77 | HR 93 | Temp 97.6°F | Resp 18

## 2023-09-30 DIAGNOSIS — E538 Deficiency of other specified B group vitamins: Secondary | ICD-10-CM | POA: Diagnosis not present

## 2023-09-30 MED ORDER — CYANOCOBALAMIN 1000 MCG/ML IJ SOLN
1000.0000 ug | Freq: Once | INTRAMUSCULAR | Status: AC
  Administered 2023-09-30: 1000 ug via INTRAMUSCULAR
  Filled 2023-09-30: qty 1

## 2023-09-30 NOTE — Progress Notes (Signed)
 Patient tolerated B12 injection with no complaints voiced.  Site clean and dry with no bruising or swelling noted at site.  See MAR for details.  Band aid applied.  Patient stable during and after injection.  Vss with discharge and left in satisfactory condition with no s/s of distress noted. All follow ups as scheduled.   Ruth Terry

## 2023-10-07 ENCOUNTER — Inpatient Hospital Stay

## 2023-10-11 ENCOUNTER — Inpatient Hospital Stay: Attending: Hematology

## 2023-10-11 NOTE — Progress Notes (Signed)
No injection needed today.

## 2023-10-12 DIAGNOSIS — R350 Frequency of micturition: Secondary | ICD-10-CM | POA: Diagnosis not present

## 2023-10-12 DIAGNOSIS — B3731 Acute candidiasis of vulva and vagina: Secondary | ICD-10-CM | POA: Diagnosis not present

## 2023-10-12 DIAGNOSIS — K649 Unspecified hemorrhoids: Secondary | ICD-10-CM | POA: Diagnosis not present

## 2023-10-14 DIAGNOSIS — M5459 Other low back pain: Secondary | ICD-10-CM | POA: Diagnosis not present

## 2023-10-20 ENCOUNTER — Other Ambulatory Visit: Payer: Self-pay | Admitting: Physician Assistant

## 2023-10-21 DIAGNOSIS — M5459 Other low back pain: Secondary | ICD-10-CM | POA: Diagnosis not present

## 2023-10-24 DIAGNOSIS — M5459 Other low back pain: Secondary | ICD-10-CM | POA: Diagnosis not present

## 2023-10-26 DIAGNOSIS — M5459 Other low back pain: Secondary | ICD-10-CM | POA: Diagnosis not present

## 2023-10-28 DIAGNOSIS — M5431 Sciatica, right side: Secondary | ICD-10-CM | POA: Diagnosis not present

## 2023-10-28 DIAGNOSIS — M47816 Spondylosis without myelopathy or radiculopathy, lumbar region: Secondary | ICD-10-CM | POA: Diagnosis not present

## 2023-10-31 ENCOUNTER — Inpatient Hospital Stay

## 2023-11-07 ENCOUNTER — Encounter: Payer: Self-pay | Admitting: Radiology

## 2023-11-07 ENCOUNTER — Other Ambulatory Visit: Payer: Self-pay | Admitting: Internal Medicine

## 2023-11-07 ENCOUNTER — Inpatient Hospital Stay

## 2023-11-21 DIAGNOSIS — B3731 Acute candidiasis of vulva and vagina: Secondary | ICD-10-CM | POA: Diagnosis not present

## 2023-11-21 DIAGNOSIS — N3001 Acute cystitis with hematuria: Secondary | ICD-10-CM | POA: Diagnosis not present

## 2023-11-21 DIAGNOSIS — R350 Frequency of micturition: Secondary | ICD-10-CM | POA: Diagnosis not present

## 2023-11-21 DIAGNOSIS — K649 Unspecified hemorrhoids: Secondary | ICD-10-CM | POA: Diagnosis not present

## 2023-11-28 ENCOUNTER — Inpatient Hospital Stay: Attending: Hematology

## 2023-11-30 ENCOUNTER — Ambulatory Visit (HOSPITAL_COMMUNITY): Admission: RE | Admit: 2023-11-30 | Source: Ambulatory Visit

## 2023-11-30 ENCOUNTER — Inpatient Hospital Stay

## 2023-12-07 ENCOUNTER — Inpatient Hospital Stay

## 2023-12-14 DIAGNOSIS — B9689 Other specified bacterial agents as the cause of diseases classified elsewhere: Secondary | ICD-10-CM | POA: Diagnosis not present

## 2023-12-14 DIAGNOSIS — J329 Chronic sinusitis, unspecified: Secondary | ICD-10-CM | POA: Diagnosis not present

## 2023-12-14 DIAGNOSIS — R197 Diarrhea, unspecified: Secondary | ICD-10-CM | POA: Diagnosis not present

## 2023-12-14 DIAGNOSIS — R350 Frequency of micturition: Secondary | ICD-10-CM | POA: Diagnosis not present

## 2023-12-16 DIAGNOSIS — R197 Diarrhea, unspecified: Secondary | ICD-10-CM | POA: Diagnosis not present

## 2023-12-26 ENCOUNTER — Inpatient Hospital Stay

## 2024-01-02 ENCOUNTER — Encounter: Payer: Self-pay | Admitting: *Deleted

## 2024-01-02 ENCOUNTER — Inpatient Hospital Stay

## 2024-01-03 ENCOUNTER — Inpatient Hospital Stay

## 2024-01-03 ENCOUNTER — Other Ambulatory Visit (HOSPITAL_COMMUNITY): Payer: Self-pay | Admitting: Internal Medicine

## 2024-01-03 DIAGNOSIS — R109 Unspecified abdominal pain: Secondary | ICD-10-CM

## 2024-01-08 ENCOUNTER — Emergency Department (HOSPITAL_COMMUNITY)

## 2024-01-08 ENCOUNTER — Emergency Department (HOSPITAL_COMMUNITY)
Admission: EM | Admit: 2024-01-08 | Discharge: 2024-01-08 | Disposition: A | Discharge status: Home / Self Care | Attending: Emergency Medicine | Admitting: Emergency Medicine

## 2024-01-08 ENCOUNTER — Encounter (HOSPITAL_COMMUNITY): Payer: Self-pay | Admitting: *Deleted

## 2024-01-08 ENCOUNTER — Other Ambulatory Visit: Payer: Self-pay

## 2024-01-08 DIAGNOSIS — R10A3 Flank pain, bilateral: Secondary | ICD-10-CM | POA: Insufficient documentation

## 2024-01-08 DIAGNOSIS — R10A0 Flank pain, unspecified side: Secondary | ICD-10-CM | POA: Diagnosis present

## 2024-01-08 DIAGNOSIS — R350 Frequency of micturition: Secondary | ICD-10-CM | POA: Insufficient documentation

## 2024-01-08 DIAGNOSIS — Z7982 Long term (current) use of aspirin: Secondary | ICD-10-CM | POA: Insufficient documentation

## 2024-01-08 DIAGNOSIS — Z7951 Long term (current) use of inhaled steroids: Secondary | ICD-10-CM | POA: Diagnosis not present

## 2024-01-08 DIAGNOSIS — R319 Hematuria, unspecified: Secondary | ICD-10-CM | POA: Diagnosis not present

## 2024-01-08 DIAGNOSIS — J449 Chronic obstructive pulmonary disease, unspecified: Secondary | ICD-10-CM | POA: Diagnosis not present

## 2024-01-08 LAB — COMPREHENSIVE METABOLIC PANEL WITH GFR
ALT: 7 U/L (ref 0–44)
AST: 12 U/L — ABNORMAL LOW (ref 15–41)
Albumin: 4.2 g/dL (ref 3.5–5.0)
Alkaline Phosphatase: 69 U/L (ref 38–126)
Anion gap: 8 (ref 5–15)
BUN: 11 mg/dL (ref 8–23)
CO2: 30 mmol/L (ref 22–32)
Calcium: 9.3 mg/dL (ref 8.9–10.3)
Chloride: 104 mmol/L (ref 98–111)
Creatinine, Ser: 0.67 mg/dL (ref 0.44–1.00)
GFR, Estimated: >60 mL/min
Glucose, Bld: 93 mg/dL (ref 70–99)
Potassium: 3.4 mmol/L — ABNORMAL LOW (ref 3.5–5.1)
Sodium: 141 mmol/L (ref 135–145)
Total Bilirubin: <0.2 mg/dL (ref 0.0–1.2)
Total Protein: 6.3 g/dL — ABNORMAL LOW (ref 6.5–8.1)

## 2024-01-08 LAB — CBC WITH DIFFERENTIAL/PLATELET
Abs Immature Granulocytes: 0.04 K/uL (ref 0.00–0.07)
Basophils Absolute: 0 K/uL (ref 0.0–0.1)
Basophils Relative: 1 %
Eosinophils Absolute: 0 K/uL (ref 0.0–0.5)
Eosinophils Relative: 0 %
HCT: 42.6 % (ref 36.0–46.0)
Hemoglobin: 14.4 g/dL (ref 12.0–15.0)
Immature Granulocytes: 1 %
Lymphocytes Relative: 15 %
Lymphs Abs: 0.8 K/uL (ref 0.7–4.0)
MCH: 30.9 pg (ref 26.0–34.0)
MCHC: 33.8 g/dL (ref 30.0–36.0)
MCV: 91.4 fL (ref 80.0–100.0)
Monocytes Absolute: 0.2 K/uL (ref 0.1–1.0)
Monocytes Relative: 4 %
Neutro Abs: 4.3 K/uL (ref 1.7–7.7)
Neutrophils Relative %: 79 %
Platelets: 226 K/uL (ref 150–400)
RBC: 4.66 MIL/uL (ref 3.87–5.11)
RDW: 13.4 % (ref 11.5–15.5)
WBC: 5.5 K/uL (ref 4.0–10.5)
nRBC: 0 % (ref 0.0–0.2)

## 2024-01-08 LAB — URINALYSIS, ROUTINE W REFLEX MICROSCOPIC
Bilirubin Urine: NEGATIVE
Glucose, UA: NEGATIVE mg/dL
Hgb urine dipstick: NEGATIVE
Ketones, ur: NEGATIVE mg/dL
Leukocytes,Ua: NEGATIVE
Nitrite: NEGATIVE
Protein, ur: NEGATIVE mg/dL
Specific Gravity, Urine: 1.006 (ref 1.005–1.030)
pH: 7 (ref 5.0–8.0)

## 2024-01-08 LAB — LIPASE, BLOOD: Lipase: 26 U/L (ref 11–51)

## 2024-01-08 MED ORDER — METHOCARBAMOL 500 MG PO TABS: 500.0000 mg | ORAL_TABLET | Freq: Two times a day (BID) | ORAL | 0 refills | Status: AC

## 2024-01-08 MED ORDER — NAPROXEN 500 MG PO TABS: 500.0000 mg | ORAL_TABLET | Freq: Two times a day (BID) | ORAL | 0 refills | Status: AC

## 2024-01-08 MED ORDER — IOHEXOL 300 MG/ML  SOLN
100.0000 mL | Freq: Once | INTRAMUSCULAR | Status: AC | PRN
  Administered 2024-01-08: 100 mL via INTRAVENOUS

## 2024-01-08 NOTE — ED Notes (Signed)
 Patient transported to CT

## 2024-01-08 NOTE — ED Notes (Signed)
 Patient requesting food and water, PA made aware.

## 2024-01-08 NOTE — ED Triage Notes (Signed)
 Pt with hematuria on 12/10 and seen UC.  Was told she did not have UTI, with lower back pain across. Hematuria has continued.

## 2024-01-08 NOTE — ED Notes (Signed)
 Pt provided with ice per PA approval.

## 2024-01-08 NOTE — Discharge Instructions (Signed)
 Begin taking naproxen  twice a day as needed for pain.  Do not take extra ibuprofen  at the same time.  May take Robaxin  twice a day as needed for pain/muscle spasms.  Apply heat pads to the back to help with pain as well.  Please follow-up with primary care doctor in 1 to 2 weeks for recheck and for any continued symptoms.  Return to ED if any symptoms worsen including severe uncontrollable pain, uncontrolled nausea/vomiting, new fevers.

## 2024-01-08 NOTE — ED Provider Notes (Signed)
 " [place] EMERGENCY DEPARTMENT AT [facility] Provider Note   CSN: [id number] Arrival date & time: 01/08/24  1710     Patient presents with: Hematuria   Ruth Terry is a 69 y.o. female.  69 year old female with a history of gastroparesis, IBS, COPD who presents to the ED for blood in the urine and flank pain for the past month.  Also notes she has had urinary frequency.  Patient notes she initially went to urgent care on 12/10 and was told she had blood in the urine.  They did not see signs of infection but she did try an antibiotic for UTI as there was blood.  She has not been able to see PCP yet.  She notes she went back to urgent care a couple days ago and was told she still had blood in her urine.  She was concerned about the blood and wants to get evaluated.  Notes her primary care doctor has a CT of her abdomen scheduled for 1/13.  Notes some mild nausea.  States she has never had a kidney stone.  Denies fevers, chills, headache, chest pain, shortness of breath, vomiting/diarrhea, dysuria.  No other complaints.    Hematuria Pertinent negatives include no chest pain, no abdominal pain and no shortness of breath.       Prior to Admission medications  Medication Sig Start Date End Date Taking? Authorizing Provider  methocarbamol  (ROBAXIN ) 500 MG tablet Take 1 tablet (500 mg total) by mouth 2 (two) times daily. 01/08/24  Yes Evann Erazo, Thersia RAMAN, PA-C  naproxen  (NAPROSYN ) 500 MG tablet Take 1 tablet (500 mg total) by mouth 2 (two) times daily. 01/08/24  Yes Neysa Thersia RAMAN, PA-C  alendronate (FOSAMAX) 70 MG tablet 1 tablet 30 minutes before the first food, beverage or medicine of the day with plain water Orally once weekly for 30 day(s) 09/14/23   [provider]  aspirin  EC 81 MG tablet Take 1 tablet (81 mg total) by mouth daily. Swallow whole. 07/17/21   Verlin Lonni BIRCH, MD  ATIVAN  1 MG tablet 1 tablet by mouth as directed  take 1 pill 1/2 hour prior to MRI - can  take 2nd if needed 08/09/23   [provider]  Cholecalciferol (VITAMIN D3 PO) Take 1 tablet by mouth daily.    [provider]  docusate sodium  (COLACE) 100 MG capsule Take 1 capsule (100 mg total) by mouth 2 (two) times daily. 09/02/21   Summerlin, Julienne Annette, PA-C  estradiol  (ESTRACE ) 0.1 MG/GM vaginal cream Discard plastic applicator. Insert a blueberry size amount (approximately 1 gram) of cream on fingertip inside vagina at bedtime every night for 1 week then every other night. For long term use. 02/21/23   Gerldine Lauraine BROCKS, FNP  ezetimibe  (ZETIA ) 10 MG tablet Take 1 tablet (10 mg total) by mouth daily. 07/27/21   Claudene Victory ORN, MD  famotidine  (PEPCID ) 20 MG tablet One after supper 05/12/21   Darlean Ozell NOVAK, MD  fluticasone  (FLONASE ) 50 MCG/ACT nasal spray Place 1 spray into both nostrils daily as needed for allergies or rhinitis.    [provider]  fosfomycin  (MONUROL ) 3 g PACK One dose = one packet (Fosfomycin  3 grams). Instructions - Day 1: Take 1 dose. Day 4: Take second dose. Day 7: Take third dose. 04/04/23   Larocco, Sarah C, FNP  hydrocortisone  (ANUSOL -HC) 25 MG suppository Place 1 suppository (25 mg total) rectally at bedtime as needed for hemorrhoids or anal itching. For 5  days 01/19/23   Esterwood, [name] S, PA-C  hyoscyamine  (LEVSIN  SL) 0.125 MG SL tablet Place 1 tablet (0.125 mg total) under the tongue every 6 (six) hours as needed. 06/17/22   Esterwood, [name] S, PA-C  levocetirizine (XYZAL ) 5 MG tablet Take 1 tablet (5 mg total) by mouth every evening. For allergies 08/22/20   Jolinda Potter M, DO  Lido-PE-Mineral Oil-Petrolatum [facility] ADVANCED) 5-0.25-17-39 % CREA Use three times daily as needed 12/09/21   Esterwood, [name] S, PA-C  Lidocaine , Anorectal, (RECTICARE) 5 % CREA Apply 1 Application topically as needed. 03/21/22   Leath-Warren, Etta PARAS, NP  LINZESS  145 MCG CAPS capsule TAKE 1 CAPSULE BY MOUTH EVERY MORNING BEFORE BREAKFAST 11/07/23   Abran Norleen SAILOR, MD  losartan (COZAAR) 25 MG tablet 1 tablet Orally Once a day for 30 day(s) 08/26/23   [provider]  nitroGLYCERIN  (NITROSTAT ) 0.4 MG SL tablet Place 1 tablet (0.4 mg total) under the tongue every 5 (five) minutes as needed for chest pain. 07/17/21   Verlin Lonni BIRCH, MD  ondansetron  (ZOFRAN ) 4 MG tablet Take 1 tablet (4 mg total) by mouth every 6 (six) hours as needed for nausea or vomiting. For nausea and vomiting 02/03/23   Esterwood, [name] S, PA-C  ondansetron  (ZOFRAN -ODT) 8 MG disintegrating tablet Take 8 mg by mouth every 8 (eight) hours as needed. 01/14/23   [provider]  pantoprazole  (PROTONIX ) 40 MG tablet TAKE 1 TABLET BY MOUTH EVERY MORNING BEFORE BREAKFAST 10/20/23   Abran Norleen SAILOR, MD  pravastatin  (PRAVACHOL ) 10 MG tablet Take 1 tablet (10 mg total) by mouth 3 (three) times a week. Take on Mondays, Wednesdays, and Fridays. 12/14/21   Claudene Victory ORN, MD  predniSONE  (DELTASONE ) 10 MG tablet SMARTSIG:- Tablet(s) By Mouth - 04/11/23   [provider]  predniSONE  (DELTASONE ) 5 MG tablet SMARTSIG:- Tablet(s) By Mouth - 07/15/23   [provider]  psyllium (METAMUCIL) 58.6 % packet Take 1 packet by mouth every evening.    [provider]  senna-docusate (SENOKOT S) 8.6-50 MG tablet Take 1-2 tablets by mouth at bedtime. 12/09/21   Esterwood, [name] S, PA-C  Simethicone  (PHAZYME MAXIMUM STRENGTH) 250 MG CAPS Take 250 mg by mouth 3 (three) times daily. Patient taking differently: Take 250 mg by mouth 3 (three) times daily as needed (gas relief). 02/20/16   Joshua Clayborne RAMAN, PA-C  sucralfate  (CARAFATE ) 1 GM/10ML suspension Take 10 mLs (1 g total) by mouth 3 (three) times daily. 08/27/22   Esterwood, [name] S, PA-C  trimethoprim  (TRIMPEX ) 100 MG tablet Take 1 tablet (100 mg total) by mouth at bedtime. 03/04/23   McKenzie, Belvie CROME, MD  valACYclovir  (VALTREX ) 1000 MG tablet TAKE 1 TABLET (1,000 MG TOTAL) BY MOUTH AT BEDTIME. 01/26/21   Gladis, Mary-Margaret, FNP   AMBULATORY NON FORMULARY MEDICATION Medication Name: Domperidone 10 mg Take 1 tablet at bedtime daily. Patient taking differently: Take 1 tablet by mouth at bedtime. Medication Name: Domperidone 10 mg Take 1 tablet at bedtime daily. 09/08/16 05/15/19  Esterwood, [name] S, PA-C    Allergies: Amoxicillin -pot clavulanate, Chlordiazepoxide -clidinium, Ciprofloxacin , Flagyl  [metronidazole ], Sulfa  drugs cross reactors, Nitrofurantoin monohyd macro, and Omnicef  [cefdinir ]    Review of Systems  Constitutional:  Negative for chills and fever.  Respiratory:  Negative for shortness of breath.   Cardiovascular:  Negative for chest pain.  Gastrointestinal:  Positive for nausea. Negative for abdominal pain, constipation, diarrhea and vomiting.  Genitourinary:  Positive for flank pain and hematuria.  All other systems reviewed  and are negative.   Updated Vital Signs BP (!) 143/75   Pulse 66   Temp 98 F (36.7 C) (Oral)   Resp 19   Ht 4' 11 (1.499 m)   Wt 49.9 kg   SpO2 97%   BMI 22.22 kg/m   Physical Exam Constitutional:      Appearance: Normal appearance.  HENT:     Head: Normocephalic and atraumatic.     Nose: Nose normal.     Mouth/Throat:     Mouth: Mucous membranes are moist.     Pharynx: Oropharynx is clear.  Cardiovascular:     Rate and Rhythm: Normal rate.  Pulmonary:     Effort: Pulmonary effort is normal.  Abdominal:     General: Bowel sounds are normal.     Palpations: Abdomen is soft.     Tenderness: There is abdominal tenderness.     Comments: Mild generalized tenderness.  Musculoskeletal:        General: Normal range of motion.     Comments: Tender palpation of the bilateral lower flank area.  No central lumbar tenderness.  Skin:    General: Skin is warm and dry.  Neurological:     Mental Status: She is alert and oriented to person, place, and time.  Psychiatric:        Mood and Affect: Mood normal.        Behavior: Behavior normal.     (all labs ordered are  listed, but only abnormal results are displayed) Labs Reviewed  URINALYSIS, ROUTINE W REFLEX MICROSCOPIC - Abnormal; Notable for the following components:      Result Value   Color, Urine STRAW (*)    All other components within normal limits  COMPREHENSIVE METABOLIC PANEL WITH GFR - Abnormal; Notable for the following components:   Potassium 3.4 (*)    Total Protein 6.3 (*)    AST 12 (*)    All other components within normal limits  CBC WITH DIFFERENTIAL/PLATELET  LIPASE, BLOOD    EKG: None  Radiology: CT ABDOMEN PELVIS W CONTRAST Result Date: 01/08/2024 EXAM: CT ABDOMEN AND PELVIS WITH CONTRAST 01/08/2024 09:41:28 PM TECHNIQUE: CT of the abdomen and pelvis was performed with the administration of 100 mL of iohexol  (OMNIPAQUE ) 300 MG/ML solution. Multiplanar reformatted images are provided for review. Automated exposure control, iterative reconstruction, and/or weight-based adjustment of the mA/kV was utilized to reduce the radiation dose to as low as reasonably achievable. COMPARISON: Comparison with 06/04/2022. CLINICAL HISTORY: Abdominal/flank pain, stone suspected. Hematuria. FINDINGS: LOWER CHEST: Bilateral lung base infiltrates, possibly pneumonia or compressive atelectasis. LIVER: Mild diffuse fatty infiltration of the liver. No focal lesions. GALLBLADDER AND BILE DUCTS: The gallbladder is surgically absent. No bile duct dilatation. SPLEEN: No acute abnormality. PANCREAS: No acute abnormality. ADRENAL GLANDS: No acute abnormality. KIDNEYS, URETERS AND BLADDER: No stones in the kidneys or ureters. No hydronephrosis. No perinephric or periureteral stranding. Urinary bladder is unremarkable. GI AND BOWEL: Stomach demonstrates no acute abnormality. Small duodenal diverticulum. The appendix is normal. There is no bowel obstruction. PERITONEUM AND RETROPERITONEUM: No ascites. No free air. VASCULATURE: Calcification of the aorta. No aneurysm. LYMPH NODES: No lymphadenopathy. REPRODUCTIVE ORGANS:  The uterus and ovaries are not enlarged. BONES AND SOFT TISSUES: No acute osseous abnormality. No focal soft tissue abnormality. IMPRESSION: 1. No evidence of urolithiasis or hydronephrosis. 2. No acute findings in the abdomen or pelvis. Electronically signed by: Elsie Gravely MD 01/08/2024 09:52 PM EST RP Workstation: [id number]     Medications Ordered  in the ED  iohexol  (OMNIPAQUE ) 300 MG/ML solution 100 mL (100 mLs Intravenous Contrast Given 01/08/24 2132)                                   Medical Decision Making Patient is a 69 year old female with a history of gastroparesis, IBS, COPD who presents to the ED for hematuria and bilateral flank pain for the past month.  Please see detailed HPI above.  On exam patient is alert and well-appearing.  Physical exam as noted above.  Vital signs stable.  Differential includes UTI, pyelonephritis, nephrolithiasis, acute abdomen.  Lab workup reassuring including normal kidney function as well as normal UA.  No signs of hematuria or infection.  CT abdomen pelvis obtained that was negative for acute process.  Suspect patient's pain most likely musculoskeletal in nature with reassuring workup today.  Vital signs are stable.  She is stable for discharge home.  Prescribed naproxen  and Robaxin .  Symptomatic care discussed.  Advised PCP follow-up in 1 to 2 weeks for recheck.  Return precautions provided for worsening symptoms.  Amount and/or Complexity of Data Reviewed Labs: ordered. Radiology: ordered.  Risk Prescription drug management.      Final diagnoses:  Bilateral flank pain    ED Discharge Orders          Ordered    naproxen  (NAPROSYN ) 500 MG tablet  2 times daily        01/08/24 2217    methocarbamol  (ROBAXIN ) 500 MG tablet  2 times daily        01/08/24 2217               Neysa Thersia GORMAN DEVONNA 01/08/24 2323    Francesca Elsie CROME, MD 01/08/24 [phone]  "

## 2024-01-09 ENCOUNTER — Ambulatory Visit

## 2024-01-09 ENCOUNTER — Inpatient Hospital Stay

## 2024-01-10 ENCOUNTER — Encounter: Payer: Self-pay | Admitting: Internal Medicine

## 2024-01-10 ENCOUNTER — Telehealth: Payer: Self-pay | Admitting: Urology

## 2024-01-10 ENCOUNTER — Telehealth: Payer: Self-pay | Admitting: Internal Medicine

## 2024-01-10 ENCOUNTER — Ambulatory Visit: Admitting: Internal Medicine

## 2024-01-10 ENCOUNTER — Ambulatory Visit

## 2024-01-10 VITALS — BP 133/73 | HR 82 | Temp 98.2°F | Wt 111.8 lb

## 2024-01-10 DIAGNOSIS — R0609 Other forms of dyspnea: Secondary | ICD-10-CM

## 2024-01-10 DIAGNOSIS — F17210 Nicotine dependence, cigarettes, uncomplicated: Secondary | ICD-10-CM | POA: Diagnosis not present

## 2024-01-10 DIAGNOSIS — J449 Chronic obstructive pulmonary disease, unspecified: Secondary | ICD-10-CM

## 2024-01-10 LAB — BRAIN NATRIURETIC PEPTIDE: Pro B Natriuretic peptide (BNP): 47 pg/mL (ref 1.0–100.0)

## 2024-01-10 LAB — SEDIMENTATION RATE: Sed Rate: 10 mm/h (ref 0–30)

## 2024-01-10 LAB — TSH: TSH: 1.58 u[IU]/mL (ref 0.35–5.50)

## 2024-01-10 NOTE — Telephone Encounter (Signed)
 Pt wanted to remind Dr. Darlean of a needed LD CT that needs to be rescheduled.

## 2024-01-10 NOTE — Assessment & Plan Note (Addendum)
 Counseled re importance of smoking cessation but did not meet time criteria for separate billing    Each maintenance medication was reviewed in detail including emphasizing most importantly the difference between maintenance and prns and under what circumstances the prns are to be triggered using an action plan format where appropriate.  Total time for H and P, chart review, counseling,  directly observing portions of ambulatory 02 saturation study/ and generating customized AVS unique to this office visit / same day charting = 40 min post ER f/u  for pt with  multiple acute  refractory respiratory/chest   symptoms of uncertain etiology

## 2024-01-10 NOTE — Assessment & Plan Note (Addendum)
 Quit smoking transiently  April 2023  - 05/12/2021   Walked on RA  x  3  lap(s) =  approx 450  ft  @ moderate pace, stopped due to end of study  with lowest 02 sats 96% and sob on 2nd lap, some chest discomfort with ex but the reported it was present before exerted as well (this was not the original hx)  - Chest LDSCT     10/08/22 mild centrilobular emphysema  - PFTs  01/19/2023 >>> not done > requested again 01/10/2024 >>>    BOLDED Dx' of most concern Symptoms are markedly disproportionate to objective findings and not clear to what extent this is actually a pulmonary  problem but pt does appear to have difficult to sort out respiratory symptoms of unknown origin for which  DDX  = almost all start with A and  include Adherence, Ace Inhibitors, Acid Reflux, Active Sinus Disease, Alpha 1 Antitripsin deficiency, Anxiety masquerading as Airways dz,  ABPA,  Allergy(esp in young), Aspiration (esp in elderly), Adverse effects of meds,  Active smoking or Vaping, A bunch of PE's/clot burden (a few small clots can't cause this syndrome unless there is already severe underlying pulm or vascular dz with poor reserve),  Anemia or thyroid  disorder, plus two Bs  = Bronchiectasis and Beta blocker use..and one C= CHF    Doe labs pending but should address most of these concerns x for  ? Anxiety > usually at the bottom of this list of usual suspects but   may interfere with adherence and also interpretation of response or lack thereof to symptom management which can be quite subjective.   >>> consder LDSCT looking for bornchiectasis   >>> f/u with PFTs in meantime

## 2024-01-10 NOTE — Patient Instructions (Addendum)
 My office will be contacting you by phone for referral to PFTs in [place]   - if you don't hear back from my office within one week please call us  back or notify us  thru MyChart and we'll address it right away.   We will walk you today to see if your 02 level drops   Please remember to go to the lab department   for your tests - we will call you with the results when they are available.  Please remember to go to the  x-ray department  for your tests - we will call you with the results when they are available    Please schedule a follow up visit in 3 months but call sooner if needed [facility] office

## 2024-01-10 NOTE — Telephone Encounter (Signed)
ewrror

## 2024-01-10 NOTE — Assessment & Plan Note (Addendum)
 Onset acute 01/07/24 with atypical CP - 01/10/2024   Walked on RA  x  2  lap(s) =  approx 500  ft  @ mod  pace, stopped due to  tired > sob  with lowest 02 sats 98%  Doe labs unremarkable and not really able to reproduced doe today nor the atypical cp with deep breath or cough but is still most likely MSCP    >>> no further w/u today, monitor for any worsening or new pattern cp > return to er prn

## 2024-01-10 NOTE — Progress Notes (Signed)
 "   Ruth Terry, female    DOB: 06-23-1955  MRN: [id number]   Brief patient profile:  43   yowf  active smoker self referred to pulmonary clinic in [place]  05/12/2021 for doe since covid Feb 2022     History of Present Illness  05/12/2021  Pulmonary/ 1st Terry eval/ Nanda Bittick / [employer] Complaint  Patient presents with   Consult    Self referral for lung nodule seen on CT scan  Patient also states since she had covid she has had chest tightness/pain and sometimes feels she is suffocating   Dyspnea:  walks 15-20 min some hill some hills over a year and about 25% of the time has chest discomfort that radiates to top of shoulders bilaterally - typically walks after supper  Cough: none  Sleep: on side/ 2 pillows bed is flat  SABA use: no inhalers Nasal congestion since covid  Has GERD on Boniva   Rec Hold Boniva  for now and consider Reclast infusions once a year or Prolia  injections twice a year  Add pepcid  20 mg after supper  I will be referring for possible angina to the [place] [facility] at [facility] Please schedule a follow up visit in 3 months but call sooner if needed with pfts on return   [facility]  07/22/21 no critical CAD > control risk factors    01/19/2023  f/u ov/Ruth Terry/Lee Kalt re: emphysema on CT  maint on no resp rx   Chief Complaint  Patient presents with   COPD   Shortness of Breath  Dyspnea:  on feet all day = MMRC1 = can walk nl pace, flat grade, can't hurry or go uphills or steps s sob   Cough: none  Sleeping: bed is flat 2 pillows  s   resp cc  SABA use: none  02: none  Lung cancer screening: referred today  Rec  The key is to stop smoking completely before smoking completely stops you!   LDSXRT  10/08/22 RADS  2  Centrilobular emphysema    01/10/2024  post ER  f/u ov/Ruth Terry re: 01/06/24  increased doe / back pain both sides worse with deep breath or cough maint on no rx   Still smoking  w/u in ER with neg u/a but no cxr or ddimer  Chief Complaint   Patient presents with   Hospitalization Follow-up    ED 1/4 with rib pain, pt states breathing is compromised. Pt has ongoing coughing w/ fatigue ness.  Dyspnea:  more sob at rest than activity and pain worse with cough or deep breath but almost sym bilateral location bases R >L  Cough: white more than usual esp in am  Sleeping: bed is flat  with lots of pillows s noct resp cc  SABA use: none  02: none   Lung cancer screening :  overdue     No obvious day to day or daytime variability or assoc  purulent sputum or mucus plugs or hemoptysis or  subjective wheeze or overt  hb symptoms.    Also denies any obvious fluctuation of symptoms with weather or environmental changes or other aggravating or alleviating factors except as outlined above   No unusual exposure hx or h/o childhood pna/ asthma or knowledge of premature birth.  Current Allergies, Complete Past Medical History, Past Surgical History, Family History, and Social History were reviewed in [employer] record.  ROS  The following are not active complaints unless bolded Hoarseness, sore throat, dysphagia,  dental problems, itching, sneezing,  nasal congestion or discharge of excess mucus or purulent secretions, ear ache,   fever, chills, sweats, unintended wt loss or wt gain, classically  exertional cp,  orthopnea pnd or arm/hand swelling  or leg swelling, presyncope, palpitations, abdominal pain, anorexia, nausea, vomiting, diarrhea  or change in bowel habits or change in bladder habits, change in stools or change in urine, dysuria, hematuria,  rash, arthralgias, visual complaints, headache, numbness, weakness or ataxia or problems with walking or coordination,  change in mood or  memory.          Outpatient Medications Prior to Visit  Medication Sig Dispense Refill   alendronate (FOSAMAX) 70 MG tablet 1 tablet 30 minutes before the first food, beverage or medicine of the day with plain water Orally once  weekly for 30 day(s)     aspirin  EC 81 MG tablet Take 1 tablet (81 mg total) by mouth daily. Swallow whole. 90 tablet 3   ATIVAN  1 MG tablet 1 tablet by mouth as directed  take 1 pill 1/2 hour prior to MRI - can take 2nd if needed     Cholecalciferol (VITAMIN D3 PO) Take 1 tablet by mouth daily.     estradiol  (ESTRACE ) 0.1 MG/GM vaginal cream Discard plastic applicator. Insert a blueberry size amount (approximately 1 gram) of cream on fingertip inside vagina at bedtime every night for 1 week then every other night. For long term use. 30 g 3   ezetimibe  (ZETIA ) 10 MG tablet Take 1 tablet (10 mg total) by mouth daily. 90 tablet 3   famotidine  (PEPCID ) 20 MG tablet One after supper     fluticasone  (FLONASE ) 50 MCG/ACT nasal spray Place 1 spray into both nostrils daily as needed for allergies or rhinitis.     fosfomycin  (MONUROL ) 3 g PACK One dose = one packet (Fosfomycin  3 grams). Instructions - Day 1: Take 1 dose. Day 4: Take second dose. Day 7: Take third dose. 9 g 0   hydrocortisone  (ANUSOL -HC) 25 MG suppository Place 1 suppository (25 mg total) rectally at bedtime as needed for hemorrhoids or anal itching. For 5 days 5 suppository 1   hyoscyamine  (LEVSIN  SL) 0.125 MG SL tablet Place 1 tablet (0.125 mg total) under the tongue every 6 (six) hours as needed. 40 tablet 0   levocetirizine (XYZAL ) 5 MG tablet Take 1 tablet (5 mg total) by mouth every evening. For allergies 90 tablet 3   Lido-PE-Mineral Oil-Petrolatum (RECTICARE ADVANCED) 5-0.25-17-39 % CREA Use three times daily as needed     Lidocaine , Anorectal, (RECTICARE) 5 % CREA Apply 1 Application topically as needed. 28 g 0   LINZESS  145 MCG CAPS capsule TAKE 1 CAPSULE BY MOUTH EVERY MORNING BEFORE BREAKFAST 90 capsule 0   losartan (COZAAR) 25 MG tablet 1 tablet Orally Once a day for 30 day(s)     meloxicam  (MOBIC ) 15 MG tablet Take 15 mg by mouth daily.     nitroGLYCERIN  (NITROSTAT ) 0.4 MG SL tablet Place 1 tablet (0.4 mg total) under the tongue  every 5 (five) minutes as needed for chest pain. 25 tablet 3   ondansetron  (ZOFRAN ) 4 MG tablet Take 1 tablet (4 mg total) by mouth every 6 (six) hours as needed for nausea or vomiting. For nausea and vomiting 30 tablet 2   ondansetron  (ZOFRAN -ODT) 8 MG disintegrating tablet Take 8 mg by mouth every 8 (eight) hours as needed.     pantoprazole  (PROTONIX ) 40 MG tablet TAKE 1 TABLET BY MOUTH  EVERY MORNING BEFORE BREAKFAST 90 tablet 2   predniSONE  (DELTASONE ) 10 MG tablet SMARTSIG:- Tablet(s) By Mouth -     psyllium (METAMUCIL) 58.6 % packet Take 1 packet by mouth every evening.     senna-docusate (SENOKOT S) 8.6-50 MG tablet Take 1-2 tablets by mouth at bedtime.     Simethicone  (PHAZYME MAXIMUM STRENGTH) 250 MG CAPS Take 250 mg by mouth 3 (three) times daily. (Patient taking differently: Take 250 mg by mouth 3 (three) times daily as needed (gas relief).) 14 capsule    sucralfate  (CARAFATE ) 1 GM/10ML suspension Take 10 mLs (1 g total) by mouth 3 (three) times daily. 1200 mL 0   valACYclovir  (VALTREX ) 1000 MG tablet TAKE 1 TABLET (1,000 MG TOTAL) BY MOUTH AT BEDTIME. 90 tablet 0   docusate sodium  (COLACE) 100 MG capsule Take 1 capsule (100 mg total) by mouth 2 (two) times daily. (Patient not taking: Reported on 01/10/2024) 20 capsule 0   methocarbamol  (ROBAXIN ) 500 MG tablet Take 1 tablet (500 mg total) by mouth 2 (two) times daily. (Patient not taking: Reported on 01/10/2024) 20 tablet 0   naproxen  (NAPROSYN ) 500 MG tablet Take 1 tablet (500 mg total) by mouth 2 (two) times daily. (Patient not taking: Reported on 01/10/2024) 30 tablet 0   pravastatin  (PRAVACHOL ) 10 MG tablet Take 1 tablet (10 mg total) by mouth 3 (three) times a week. Take on Mondays, Wednesdays, and Fridays. (Patient not taking: Reported on 01/10/2024) 35 tablet 3   predniSONE  (DELTASONE ) 5 MG tablet SMARTSIG:- Tablet(s) By Mouth - (Patient not taking: Reported on 01/10/2024)     trimethoprim  (TRIMPEX ) 100 MG tablet Take 1 tablet (100 mg total) by  mouth at bedtime. (Patient not taking: Reported on 01/10/2024) 30 tablet 5   No facility-administered medications prior to visit.         Past Medical History:  Diagnosis Date   Allergy    Anal fissure    Cataract    Chronic constipation    Diverticulosis 04/20/2010   colonoscopy   Gastroparesis    GERD (gastroesophageal reflux disease)    Hemorrhoids    Hyperlipidemia    IBS (irritable bowel syndrome)    MVP (mitral valve prolapse)    Osteoporosis    Polyp, stomach 04/20/2010   egd   Shingles    UTI (lower urinary tract infection)        Objective:    wts 01/10/2024        111   01/19/2023       105  05/12/21 99 lb 3.2 oz (45 kg)  04/29/21 98 lb 4 oz (44.6 kg)  03/26/21 96 lb 9.6 oz (43.8 kg)    Vital signs reviewed  01/10/2024  - Note at rest 02 sats  98% on RA   General appearance:    amb somewhat anxious wf nad    HEENT : Oropharynx    Nasal turbinates nl    NECK :  without  apparent JVD/ palpable Nodes/TM    LUNGS: no acc muscle use,  Min barrel  contour chest wall with bilateral  slightly decreased bs s audible wheeze and  without cough on insp or exp maneuvers and min  Hyperresonant  to  percussion bilaterally    CV:  RRR  no s3 or murmur or increase in P2, and no edema   ABD:  soft and nontender    MS:  Nl gait/ ext warm without deformities Or obvious joint restrictions  calf tenderness, cyanosis or  clubbing     SKIN: warm and dry without lesions    NEURO:  alert, approp, nl sensorium with  no motor or cerebellar deficits apparent.               CXR PA and Lateral:   01/10/2024 :    I personally reviewed images and impression is as follows:     Mod kyphosis,no effusions or infiltrates     U/a in  urgent care 01/08/24  neg    Labs ordered/ reviewed:      Chemistry      Component Value Date/Time   NA 141 01/08/2024 2046   NA 141 07/17/2021 1419   K 3.4 (L) 01/08/2024 2046   CL 104 01/08/2024 2046   CO2 30 01/08/2024 2046   BUN 11  01/08/2024 2046   BUN 5 (L) 07/17/2021 1419   CREATININE 0.67 01/08/2024 2046      Component Value Date/Time   CALCIUM  9.3 01/08/2024 2046   ALKPHOS 69 01/08/2024 2046   AST 12 (L) 01/08/2024 2046   ALT 7 01/08/2024 2046   BILITOT <0.2 01/08/2024 2046   BILITOT <0.2 07/08/2020 1507        Lab Results  Component Value Date   WBC 5.5 01/08/2024   HGB 14.4 01/08/2024   HCT 42.6 01/08/2024   MCV 91.4 01/08/2024   PLT 226 01/08/2024     Lab Results  Component Value Date   DDIMER <0.19 01/10/2024      Lab Results  Component Value Date   TSH 1.58 01/10/2024     Lab Results  Component Value Date   PROBNP 47.0 01/10/2024       Lab Results  Component Value Date   ESRSEDRATE 10 01/10/2024   ESRSEDRATE 20 12/09/2021   ESRSEDRATE 5 05/29/2019       Assessment   Assessment & Plan DOE (dyspnea on exertion) Onset acute 01/07/24 with atypical CP - 01/10/2024   Walked on RA  x  2  lap(s) =  approx 500  ft  @ mod  pace, stopped due to  tired > sob  with lowest 02 sats 98%  Doe labs unremarkable and not really able to reproduced doe today nor the atypical cp with deep breath or cough but is still most likely MSCP    >>> no further w/u today, monitor for any worsening or new pattern cp > return to er prn    COPD GOLD ? / group B Quit smoking transiently  April 2023  - 05/12/2021   Walked on RA  x  3  lap(s) =  approx 450  ft  @ moderate pace, stopped due to end of study  with lowest 02 sats 96% and sob on 2nd lap, some chest discomfort with ex but the reported it was present before exerted as well (this was not the original hx)  - Chest LDSCT     10/08/22 mild centrilobular emphysema  - PFTs  01/19/2023 >>> not done > requested again 01/10/2024 >>>    BOLDED Dx' of most concern Symptoms are markedly disproportionate to objective findings and not clear to what extent this is actually a pulmonary  problem but pt does appear to have difficult to sort out respiratory symptoms of  unknown origin for which  DDX  = almost all start with A and  include Adherence, Ace Inhibitors, Acid Reflux, Active Sinus Disease, Alpha 1 Antitripsin deficiency, Anxiety masquerading as Airways dz,  ABPA,  Allergy(esp in young),  Aspiration (esp in elderly), Adverse effects of meds,  Active smoking or Vaping, A bunch of PE's/clot burden (a few small clots can't cause this syndrome unless there is already severe underlying pulm or vascular dz with poor reserve),  Anemia or thyroid  disorder, plus two Bs  = Bronchiectasis and Beta blocker use..and one C= CHF    Doe labs pending but should address most of these concerns x for  ? Anxiety > usually at the bottom of this list of usual suspects but   may interfere with adherence and also interpretation of response or lack thereof to symptom management which can be quite subjective.   >>> consder LDSCT looking for bornchiectasis   >>> f/u with PFTs in meantime   Cigarette smoker Counseled re importance of smoking cessation but did not meet time criteria for separate billing    Each maintenance medication was reviewed in detail including emphasizing most importantly the difference between maintenance and prns and under what circumstances the prns are to be triggered using an action plan format where appropriate.  Total time for H and P, chart review, counseling,  directly observing portions of ambulatory 02 saturation study/ and generating customized AVS unique to this Terry visit / same day charting = 40 min post ER f/u  for pt with  multiple acute  refractory respiratory/chest   symptoms of uncertain etiology                   AVS  Patient Instructions  My Terry will be contacting you by phone for referral to PFTs in [place]   - if you don't hear back from my Terry within one week please call us  back or notify us  thru MyChart and we'll address it right away.   We will walk you today to see if your 02 level drops   Please remember to go to  the lab department   for your tests - we will call you with the results when they are available.  Please remember to go to the  x-ray department  for your tests - we will call you with the results when they are available    Please schedule a follow up visit in 3 months but call sooner if needed RDS Terry                Ozell America, MD 01/10/2024         "

## 2024-01-11 ENCOUNTER — Ambulatory Visit: Admitting: Urology

## 2024-01-11 VITALS — BP 137/77 | HR 89

## 2024-01-11 DIAGNOSIS — N390 Urinary tract infection, site not specified: Secondary | ICD-10-CM

## 2024-01-11 DIAGNOSIS — R399 Unspecified symptoms and signs involving the genitourinary system: Secondary | ICD-10-CM

## 2024-01-11 DIAGNOSIS — Z87440 Personal history of urinary (tract) infections: Secondary | ICD-10-CM | POA: Diagnosis not present

## 2024-01-11 DIAGNOSIS — N3281 Overactive bladder: Secondary | ICD-10-CM

## 2024-01-11 LAB — URINALYSIS, ROUTINE W REFLEX MICROSCOPIC
Bilirubin, UA: NEGATIVE
Glucose, UA: NEGATIVE
Ketones, UA: NEGATIVE
Leukocytes,UA: NEGATIVE
Nitrite, UA: NEGATIVE
Protein,UA: NEGATIVE
RBC, UA: NEGATIVE
Specific Gravity, UA: 1.01 (ref 1.005–1.030)
Urobilinogen, Ur: 0.2 mg/dL (ref 0.2–1.0)
pH, UA: 6 (ref 5.0–7.5)

## 2024-01-11 LAB — D-DIMER, QUANTITATIVE: D-Dimer, Quant: <0.19 ug{FEU}/mL

## 2024-01-11 MED ORDER — DOXYCYCLINE HYCLATE 100 MG PO CAPS: 100.0000 mg | ORAL_CAPSULE | Freq: Two times a day (BID) | ORAL | 0 refills | Status: AC

## 2024-01-11 MED ORDER — MIRABEGRON ER 25 MG PO TB24: 25.0000 mg | ORAL_TABLET | Freq: Every day | ORAL | 0 refills | Status: AC

## 2024-01-11 NOTE — Progress Notes (Signed)
 Vikor test sent tracking # [id number]

## 2024-01-11 NOTE — Telephone Encounter (Signed)
 Called and spoke with the pt and advised Dr. Darlean is trying to do as less imaging as possible. Dr. Darlean will review labs and imaging and decide if further imaging is needed.  Pt would also like to have PFT done at [facility], placing new order.

## 2024-01-11 NOTE — Progress Notes (Signed)
 "  01/11/2024 2:47 PM   Barnie LITTIE Sar April 28, 1955 [id number]  Referring provider: Renato Dorothey HERO, NP [phone] [place]  [address] [place],  [place] [address]  Followup OAb and frequent UTI   HPI: Ms Bruns is a 35bn here for followup for OAb and frequent UTI. Since Nov 2025 she has had increased urinary urgency, frequency, and dysuria. She has had 3 urine culture duirng that time and the last culture grew 3 organisms. She stopped the trimethroprim 6 months ago.  Urine culture from [facility] from 2 days ago shows 3 organisms.   PMH: Past Medical History:  Diagnosis Date   Allergy    Anal fissure    Anemia, iron deficiency 10/26/2022   B12 deficiency 09/07/2023   Cataract    Chronic constipation    Coronary artery disease    Diverticulosis 04/20/2010   colonoscopy   Gastroparesis    GERD (gastroesophageal reflux disease)    Hemorrhoids    Hemorrhoids    Hyperlipidemia    IBS (irritable bowel syndrome)    MVP (mitral valve prolapse)    as a teenager per patient, no issues since   Osteoporosis    Polyp, stomach 04/20/2010   egd   Shingles    UTI (lower urinary tract infection)     Surgical History: Past Surgical History:  Procedure Laterality Date   CHOLECYSTECTOMY N/A 09/21/2021   Procedure: LAPAROSCOPIC CHOLECYSTECTOMY;  Surgeon: Dasie Leonor LITTIE, MD;  Location: [facility] OR;  Service: General;  Laterality: N/A;   COLONOSCOPY     CORONARY PRESSURE/FFR STUDY N/A 07/22/2021   Procedure: INTRAVASCULAR PRESSURE WIRE/FFR STUDY;  Surgeon: Claudene Victory ORN, MD;  Location: [facility] INVASIVE CV LAB;  Service: Cardiovascular;  Laterality: N/A;   ESOPHAGOGASTRODUODENOSCOPY     LEFT HEART CATH AND CORONARY ANGIOGRAPHY N/A 07/22/2021   Procedure: LEFT HEART CATH AND CORONARY ANGIOGRAPHY;  Surgeon: Claudene Victory ORN, MD;  Location: [facility] INVASIVE CV LAB;  Service: Cardiovascular;  Laterality: N/A;    Home Medications:  Allergies as of 01/11/2024       Reactions   Amoxicillin -pot Clavulanate Nausea And Vomiting    Chlordiazepoxide -clidinium Itching   (Librax)   Ciprofloxacin  Itching   Irritates stomach   Flagyl  [metronidazole ]    Severe yeast infection   Sulfa  Drugs Cross Reactors Nausea Only   Nitrofurantoin Monohyd Macro Rash   Omnicef  [cefdinir ] Nausea And Vomiting        Medication List        Accurate as of January 11, 2024  2:47 PM. If you have any questions, ask your nurse or doctor.          alendronate 70 MG tablet Commonly known as: FOSAMAX 1 tablet 30 minutes before the first food, beverage or medicine of the day with plain water Orally once weekly for 30 day(s)   aspirin  EC 81 MG tablet Take 1 tablet (81 mg total) by mouth daily. Swallow whole.   Ativan  1 MG tablet Generic drug: LORazepam  1 tablet by mouth as directed  take 1 pill 1/2 hour prior to MRI - can take 2nd if needed   docusate sodium  100 MG capsule Commonly known as: Colace Take 1 capsule (100 mg total) by mouth 2 (two) times daily.   estradiol  0.1 MG/GM vaginal cream Commonly known as: ESTRACE  Discard plastic applicator. Insert a blueberry size amount (approximately 1 gram) of cream on fingertip inside vagina at bedtime every night for 1 week then every other night. For long term use.   ezetimibe   10 MG tablet Commonly known as: ZETIA  Take 1 tablet (10 mg total) by mouth daily.   famotidine  20 MG tablet Commonly known as: Pepcid  One after supper   fluticasone  50 MCG/ACT nasal spray Commonly known as: FLONASE  Place 1 spray into both nostrils daily as needed for allergies or rhinitis.   fosfomycin  3 g Pack Commonly known as: MONUROL  One dose = one packet (Fosfomycin  3 grams). Instructions - Day 1: Take 1 dose. Day 4: Take second dose. Day 7: Take third dose.   hydrocortisone  25 MG suppository Commonly known as: ANUSOL -HC Place 1 suppository (25 mg total) rectally at bedtime as needed for hemorrhoids or anal itching. For 5 days   hyoscyamine  0.125 MG SL tablet Commonly known as: LEVSIN  SL Place  1 tablet (0.125 mg total) under the tongue every 6 (six) hours as needed.   levocetirizine 5 MG tablet Commonly known as: XYZAL  Take 1 tablet (5 mg total) by mouth every evening. For allergies   Lidocaine  (Anorectal) 5 % Crea Commonly known as: RectiCare Apply 1 Application topically as needed.   Linzess  145 MCG Caps capsule Generic drug: linaclotide  TAKE 1 CAPSULE BY MOUTH EVERY MORNING BEFORE BREAKFAST   losartan 25 MG tablet Commonly known as: COZAAR 1 tablet Orally Once a day for 30 day(s)   meloxicam  15 MG tablet Commonly known as: MOBIC  Take 15 mg by mouth daily.   methocarbamol  500 MG tablet Commonly known as: ROBAXIN  Take 1 tablet (500 mg total) by mouth 2 (two) times daily.   naproxen  500 MG tablet Commonly known as: NAPROSYN  Take 1 tablet (500 mg total) by mouth 2 (two) times daily.   nitroGLYCERIN  0.4 MG SL tablet Commonly known as: NITROSTAT  Place 1 tablet (0.4 mg total) under the tongue every 5 (five) minutes as needed for chest pain.   ondansetron  4 MG tablet Commonly known as: Zofran  Take 1 tablet (4 mg total) by mouth every 6 (six) hours as needed for nausea or vomiting. For nausea and vomiting   ondansetron  8 MG disintegrating tablet Commonly known as: ZOFRAN -ODT Take 8 mg by mouth every 8 (eight) hours as needed.   pantoprazole  40 MG tablet Commonly known as: PROTONIX  TAKE 1 TABLET BY MOUTH EVERY MORNING BEFORE BREAKFAST   pravastatin  10 MG tablet Commonly known as: PRAVACHOL  Take 1 tablet (10 mg total) by mouth 3 (three) times a week. Take on Mondays, Wednesdays, and Fridays.   predniSONE  10 MG tablet Commonly known as: DELTASONE  SMARTSIG:- Tablet(s) By Mouth -   predniSONE  5 MG tablet Commonly known as: DELTASONE  SMARTSIG:- Tablet(s) By Mouth -   psyllium 58.6 % packet Commonly known as: METAMUCIL Take 1 packet by mouth every evening.   RectiCare Advanced 5-0.25-17-39 % Crea Generic drug: Lido-PE-Mineral Oil-Petrolatum Use three  times daily as needed   senna-docusate 8.6-50 MG tablet Commonly known as: Senokot S Take 1-2 tablets by mouth at bedtime.   Simethicone  250 MG Caps Commonly known as: Phazyme Maximum Strength Take 250 mg by mouth 3 (three) times daily. What changed:  when to take this reasons to take this   sucralfate  1 GM/10ML suspension Commonly known as: CARAFATE  Take 10 mLs (1 g total) by mouth 3 (three) times daily.   trimethoprim  100 MG tablet Commonly known as: TRIMPEX  Take 1 tablet (100 mg total) by mouth at bedtime.   valACYclovir  1000 MG tablet Commonly known as: VALTREX  TAKE 1 TABLET (1,000 MG TOTAL) BY MOUTH AT BEDTIME.   VITAMIN D3 PO Take 1 tablet by mouth  daily.        Allergies: Allergies[1]  Family History: Family History  Problem Relation Age of Onset   Hypertension Mother    Diabetes Father    Heart attack Father        Age 33   Colon cancer Neg Hx    Esophageal cancer Neg Hx    Rectal cancer Neg Hx     Social History:  reports that she has been smoking cigarettes. She started smoking about 23 years ago. She has a 20 pack-year smoking history. She has never used smokeless tobacco. She reports that she does not currently use alcohol. She reports that she does not use drugs.  ROS: All other review of systems were reviewed and are negative except what is noted above in HPI  Physical Exam: BP 137/77   Pulse 89   Constitutional:  Alert and oriented, No acute distress. HEENT: [place] AT, moist mucus membranes.  Trachea midline, no masses. Cardiovascular: No clubbing, cyanosis, or edema. Respiratory: Normal respiratory effort, no increased work of breathing. GI: Abdomen is soft, nontender, nondistended, no abdominal masses GU: No CVA tenderness.  Lymph: No cervical or inguinal lymphadenopathy. Skin: No rashes, bruises or suspicious lesions. Neurologic: Grossly intact, no focal deficits, moving all 4 extremities. Psychiatric: Normal mood and affect.  Laboratory  Data: Lab Results  Component Value Date   WBC 5.5 01/08/2024   HGB 14.4 01/08/2024   HCT 42.6 01/08/2024   MCV 91.4 01/08/2024   PLT 226 01/08/2024    Lab Results  Component Value Date   CREATININE 0.67 01/08/2024    No results found for: PSA  No results found for: TESTOSTERONE  Lab Results  Component Value Date   HGBA1C 5.5 05/08/2018    Urinalysis    Component Value Date/Time   COLORURINE STRAW (A) 01/08/2024 1745   APPEARANCEUR CLEAR 01/08/2024 1745   APPEARANCEUR Clear 03/30/2023 1006   LABSPEC 1.006 01/08/2024 1745   PHURINE 7.0 01/08/2024 1745   GLUCOSEU NEGATIVE 01/08/2024 1745   GLUCOSEU NEGATIVE 10/09/2014 1544   HGBUR NEGATIVE 01/08/2024 1745   BILIRUBINUR NEGATIVE 01/08/2024 1745   BILIRUBINUR Negative 03/30/2023 1006   KETONESUR NEGATIVE 01/08/2024 1745   PROTEINUR NEGATIVE 01/08/2024 1745   UROBILINOGEN 0.2 09/19/2022 1408   UROBILINOGEN 0.2 11/11/2014 1321   NITRITE NEGATIVE 01/08/2024 1745   LEUKOCYTESUR NEGATIVE 01/08/2024 1745    Lab Results  Component Value Date   LABMICR Comment 03/30/2023   WBCUA None seen 11/13/2020   RBCUA 3-10 (A) 01/23/2018   LABEPIT None seen 11/13/2020   BACTERIA NONE SEEN 09/27/2021    Pertinent Imaging:  Results for orders placed during the hospital encounter of 06/04/22  DG Abd 1 View  Narrative CLINICAL DATA:  Abdominal pain  EXAM: ABDOMEN - 1 VIEW  COMPARISON:  01/02/2021  FINDINGS: Mottled gas pattern is nonspecific. No abnormal masses or calcifications are seen. Surgical clips are seen in right upper quadrant. Small sclerotic density in proximal right femur has not changed, possibly bone island.  IMPRESSION: Nonspecific bowel gas pattern.   Electronically Signed By: Gearldine Mary M.D. On: 06/04/2022 15:23  No results found for this or any previous visit.  No results found for this or any previous visit.  No results found for this or any previous visit.  Results for  orders placed during the hospital encounter of 03/01/23  US  RENAL  Narrative CLINICAL DATA:  Recurrent urinary tract infection  EXAM: RENAL / URINARY TRACT ULTRASOUND COMPLETE  COMPARISON:  Ultrasound abdomen  06/09/2022  FINDINGS: Right Kidney:  Renal measurements: 9.0 x 3.9 x 4.1 cm = volume: 75.5 mL. Echogenicity within normal limits. No mass or hydronephrosis visualized.  Left Kidney:  Renal measurements: 9.7 x 4.3 x 4.4 cm = volume: 95.3 mL. Echogenicity within normal limits. No mass or hydronephrosis visualized.  Bladder:  Appears normal for degree of bladder distention.  Other:  None.  IMPRESSION: No hydronephrosis.   Electronically Signed By: Bard Moats M.D. On: 03/04/2023 17:03  No results found for this or any previous visit.  No results found for this or any previous visit.  Results for orders placed during the hospital encounter of 11/08/20  CT Renal Stone Study  Narrative CLINICAL DATA:  Bilateral flank pain for 5 days. Kidney stone suspected. Patient recently diagnosed with UTI.  EXAM: CT ABDOMEN AND PELVIS WITHOUT CONTRAST  TECHNIQUE: Multidetector CT imaging of the abdomen and pelvis was performed following the standard protocol without IV contrast.  COMPARISON:  September 05, 2020  FINDINGS: Lower chest: 4 mm nodule in the right lower lobe adjacent to the fissure on series 4, image 6. This region was not included on previous studies. No other abnormalities in the lung bases.  Hepatobiliary: No focal liver abnormality is seen. No gallstones, gallbladder wall thickening, or biliary dilatation.  Pancreas: Unremarkable. No pancreatic ductal dilatation or surrounding inflammatory changes.  Spleen: Normal in size without focal abnormality.  Adrenals/Urinary Tract: Adrenal glands are unremarkable. Kidneys are normal, without renal calculi, focal lesion, or hydronephrosis. Bladder is unremarkable.  Stomach/Bowel: The wall of the  gastric fundus, cardia, and proximal body appear somewhat thickened. The remainder of the stomach is normal. Small bowel is normal. And appendix are unremarkable.  Vascular/Lymphatic: Calcified atherosclerosis in the nonaneurysmal aorta. No adenopathy.  Reproductive: Uterus and bilateral adnexa are unremarkable.  Other: No free air free fluid. A fat containing umbilical hernia is identified.  Musculoskeletal: No acute or significant osseous findings.  IMPRESSION: 1. 4 mm nodule in the right lower lobe adjacent to the fissure on series 4, image 6. This region was not included on previous studies. No follow-up needed if patient is low-risk. Non-contrast chest CT can be considered in 12 months if patient is high-risk. This recommendation follows the consensus statement: Guidelines for Management of Incidental Pulmonary Nodules Detected on CT Images: From the Fleischner Society 2017; Radiology 2017; 284:228-243. 2. The wall of the gastric fundus, cardia, and proximal body appear thickened. While this could be due to lack of distention, the finding is similar since previous studies raising the possibility of tree thickening. Endoscopy or upper GI could better evaluate. Recommend clinical correlation. 3. Calcified atherosclerosis in the nonaneurysmal abdominal aorta. 4. Fat containing umbilical hernia. 5. No other abnormalities. Specifically, no renal stones or obstruction.   Electronically Signed By: Alm Pouch III M.D. On: 11/08/2020 17:35   Assessment & Plan:    1.OAB Mirabegron  25mg  daily - Urinalysis, Routine w reflex microscopic  2. Recurrent UTI Vikor test, will call with results -doxycycline  100mg  BID for 7 days   No follow-ups on file.  Belvie Clara, MD  [facility] [place]       [1]  Allergies Allergen Reactions   Amoxicillin -Pot Clavulanate Nausea And Vomiting   Chlordiazepoxide -Clidinium Itching    (Librax)   Ciprofloxacin   Itching    Irritates stomach   Flagyl  [Metronidazole ]     Severe yeast infection   Sulfa  Drugs Cross Reactors Nausea Only   Nitrofurantoin Monohyd Macro Rash   Omnicef  [Cefdinir ]  Nausea And Vomiting   "

## 2024-01-12 ENCOUNTER — Ambulatory Visit: Payer: Self-pay | Admitting: Internal Medicine

## 2024-01-12 LAB — ALPHA-1-ANTITRYPSIN PHENOTYP: A-1 Antitrypsin: 162 mg/dL (ref 101–187)

## 2024-01-16 ENCOUNTER — Inpatient Hospital Stay: Admitting: Physician Assistant

## 2024-01-17 ENCOUNTER — Telehealth: Payer: Self-pay | Admitting: Urology

## 2024-01-17 ENCOUNTER — Telehealth: Payer: Self-pay

## 2024-01-17 ENCOUNTER — Encounter (HOSPITAL_COMMUNITY): Payer: Self-pay

## 2024-01-17 ENCOUNTER — Ambulatory Visit (HOSPITAL_COMMUNITY)

## 2024-01-17 ENCOUNTER — Encounter: Payer: Self-pay | Admitting: Urology

## 2024-01-17 NOTE — Telephone Encounter (Signed)
 Patient returned call requesting UA results.   Test was completed on 01/11/2024.   Best number to contact patient [phone] Call transferred to clinical basket.

## 2024-01-17 NOTE — Telephone Encounter (Signed)
 Patient called to check on her Vikor testing patient was advised that the results were received today and we have roiuted them to MD for review

## 2024-01-17 NOTE — Patient Instructions (Signed)

## 2024-01-18 ENCOUNTER — Telehealth: Payer: Self-pay

## 2024-01-18 ENCOUNTER — Encounter: Payer: Self-pay | Admitting: Physician Assistant

## 2024-01-18 NOTE — Telephone Encounter (Signed)
 Called patient to schedule 7 days of gentamicin  injection patient made aware and confirmed all dates and times

## 2024-01-18 NOTE — Telephone Encounter (Signed)
 See other encounter from 01/18/2024

## 2024-01-18 NOTE — Telephone Encounter (Signed)
 Copied from CRM [id number]. Topic: Clinical - Lab/Test Results >> Jan 16, 2024 11:18 AM Ruth Terry wrote: Reason for CRM: Patient is looking to speak with a nurse regarding her recent CT/blood work if/when available. Please call back to advise.   I called and spoke to pt. Pt informed that we have not received the radiology report yet on her cxr however her blood work looked fine! Pt states she has a bad UTI along with strep throat. Pt wanted to make sure her scan came back fine for her lungs. Pt states she was told she did have a lung nodule and wanted to know if Dr Darlean wanted to do a low dose ct scan for further eval. Please advise

## 2024-01-19 ENCOUNTER — Other Ambulatory Visit: Payer: Self-pay

## 2024-01-19 DIAGNOSIS — Z122 Encounter for screening for malignant neoplasm of respiratory organs: Secondary | ICD-10-CM

## 2024-01-19 DIAGNOSIS — Z87891 Personal history of nicotine dependence: Secondary | ICD-10-CM

## 2024-01-19 NOTE — Telephone Encounter (Unsigned)
 Copied from CRM [id number]. Topic: Clinical - Lab/Test Results >> Jan 16, 2024  2:48 PM Ruth Terry wrote: Reason for CRM: Pt returning phone call regarding lab results, advised pt of results and she still had questions. Pt also stated she would like to do her PFT at [facility].

## 2024-01-19 NOTE — Telephone Encounter (Signed)
 This was already handled. NFN

## 2024-01-20 ENCOUNTER — Telehealth: Payer: Self-pay

## 2024-01-20 NOTE — Telephone Encounter (Signed)
 Patient called stating she has a UTI and is taking Gentamicin  injections for it. Patient wanted to know if B12 reacted with the injection. She also wanted to change her appointment Monday.  Called patient to let her know that there is no interaction between B12 and the Gentamicin  injection. Patient was transferred over to scheduling to reschedule appointment.

## 2024-01-20 NOTE — Telephone Encounter (Signed)
 Called patient.  Gave information.  Patient states someone has already called her and given all information.  Patient verbalized understanding.

## 2024-01-23 ENCOUNTER — Ambulatory Visit

## 2024-01-23 ENCOUNTER — Inpatient Hospital Stay

## 2024-01-23 ENCOUNTER — Inpatient Hospital Stay: Attending: Hematology

## 2024-01-23 DIAGNOSIS — R399 Unspecified symptoms and signs involving the genitourinary system: Secondary | ICD-10-CM

## 2024-01-23 MED ORDER — GENTAMICIN SULFATE 40 MG/ML IJ SOLN
80.0000 mg | Freq: Once | INTRAMUSCULAR | Status: AC
  Administered 2024-01-23: 80 mg via INTRAMUSCULAR

## 2024-01-23 NOTE — Progress Notes (Addendum)
 Patient receiving Gentamicin  injection per MD order  The injection site was cleaned and prepped with alcohol. A band aid applied after injection given.   IM  injection  Medication: Gentamicin  Dose: 80 Location: left Ventrogluteal  Lot: [id number]  Exp: 01/2025  Patient tolerated well, no complications were noted  Performed by: Carlos, CMA

## 2024-01-24 ENCOUNTER — Inpatient Hospital Stay

## 2024-01-24 ENCOUNTER — Inpatient Hospital Stay: Attending: Hematology

## 2024-01-24 VITALS — BP 125/66 | HR 90 | Temp 98.0°F | Resp 18

## 2024-01-24 DIAGNOSIS — E611 Iron deficiency: Secondary | ICD-10-CM | POA: Insufficient documentation

## 2024-01-24 DIAGNOSIS — E538 Deficiency of other specified B group vitamins: Secondary | ICD-10-CM | POA: Insufficient documentation

## 2024-01-24 DIAGNOSIS — D509 Iron deficiency anemia, unspecified: Secondary | ICD-10-CM

## 2024-01-24 DIAGNOSIS — D508 Other iron deficiency anemias: Secondary | ICD-10-CM

## 2024-01-24 LAB — CBC WITH DIFFERENTIAL/PLATELET
Abs Immature Granulocytes: 0.01 K/uL (ref 0.00–0.07)
Basophils Absolute: 0 K/uL (ref 0.0–0.1)
Basophils Relative: 1 %
Eosinophils Absolute: 0.1 K/uL (ref 0.0–0.5)
Eosinophils Relative: 3 %
HCT: 43.9 % (ref 36.0–46.0)
Hemoglobin: 14.6 g/dL (ref 12.0–15.0)
Immature Granulocytes: 0 %
Lymphocytes Relative: 24 %
Lymphs Abs: 1.1 K/uL (ref 0.7–4.0)
MCH: 31.1 pg (ref 26.0–34.0)
MCHC: 33.3 g/dL (ref 30.0–36.0)
MCV: 93.6 fL (ref 80.0–100.0)
Monocytes Absolute: 0.4 K/uL (ref 0.1–1.0)
Monocytes Relative: 8 %
Neutro Abs: 2.9 K/uL (ref 1.7–7.7)
Neutrophils Relative %: 64 %
Platelets: 193 K/uL (ref 150–400)
RBC: 4.69 MIL/uL (ref 3.87–5.11)
RDW: 13.3 % (ref 11.5–15.5)
WBC: 4.5 K/uL (ref 4.0–10.5)
nRBC: 0 % (ref 0.0–0.2)

## 2024-01-24 LAB — IRON AND TIBC
Iron: 98 ug/dL (ref 28–170)
Saturation Ratios: 29 % (ref 10.4–31.8)
TIBC: 335 ug/dL (ref 250–450)
UIBC: 237 ug/dL

## 2024-01-24 LAB — FERRITIN: Ferritin: 95 ng/mL (ref 11–307)

## 2024-01-24 LAB — VITAMIN B12: Vitamin B-12: 530 pg/mL (ref 180–914)

## 2024-01-24 MED ORDER — CYANOCOBALAMIN 1000 MCG/ML IJ SOLN
1000.0000 ug | Freq: Once | INTRAMUSCULAR | Status: AC
  Administered 2024-01-24: 1000 ug via INTRAMUSCULAR
  Filled 2024-01-24: qty 1

## 2024-01-24 NOTE — Patient Instructions (Signed)
 [facility] [place] [facility]  Discharge Instructions: Thank you for choosing [facility] to provide your oncology and hematology care.  If you have a lab appointment with the Cancer Center - please note that after April 8th, 2024, all labs will be drawn in the cancer center.  You do not have to check in or register with the main entrance as you have in the past but will complete your check-in in the cancer center.  Wear comfortable clothing and clothing appropriate for easy access to any Portacath or PICC line.   We strive to give you quality time with your provider. You may need to reschedule your appointment if you arrive late (15 or more minutes).  Arriving late affects you and other patients whose appointments are after yours.  Also, if you miss three or more appointments without notifying the office, you may be dismissed from the clinic at the provider's discretion.      For prescription refill requests, have your pharmacy contact our office and allow 72 hours for refills to be completed.    Today you received the following B12 return as scheduled.   To help prevent nausea and vomiting after your treatment, we encourage you to take your nausea medication as directed.  BELOW ARE SYMPTOMS THAT SHOULD BE REPORTED IMMEDIATELY: *FEVER GREATER THAN 100.4 F (38 C) OR HIGHER *CHILLS OR SWEATING *NAUSEA AND VOMITING THAT IS NOT CONTROLLED WITH YOUR NAUSEA MEDICATION *UNUSUAL SHORTNESS OF BREATH *UNUSUAL BRUISING OR BLEEDING *URINARY PROBLEMS (pain or burning when urinating, or frequent urination) *BOWEL PROBLEMS (unusual diarrhea, constipation, pain near the anus) TENDERNESS IN MOUTH AND THROAT WITH OR WITHOUT PRESENCE OF ULCERS (sore throat, sores in mouth, or a toothache) UNUSUAL RASH, SWELLING OR PAIN  UNUSUAL VAGINAL DISCHARGE OR ITCHING   Items with * indicate a potential emergency and should be followed up as soon as possible or go to  the Emergency Department if any problems should occur.  Please show the CHEMOTHERAPY ALERT CARD or IMMUNOTHERAPY ALERT CARD at check-in to the Emergency Department and triage nurse.  Should you have questions after your visit or need to cancel or reschedule your appointment, please contact [facility] [place] - A DEPT OF [facility]ideg[facility] [phone]  and follow the prompts.  Office hours are 8:00 a.m. to 4:30 p.m. Monday - Friday. Please note that voicemails left after 4:00 p.m. may not be returned until the following business day.  We are closed weekends and major holidays. You have access to a nurse at all times for urgent questions. Please call the main number to the clinic [phone] and follow the prompts.  For any non-urgent questions, you may also contact your provider using MyChart. We now offer e-Visits for anyone 33 and older to request care online for non-urgent symptoms. For details visit [email or web address].   Also download the MyChart app! Go to the app store, search "MyChart", open the app, select [facility], and log in with your MyChart username and password.

## 2024-01-24 NOTE — Progress Notes (Signed)
 Patient tolerated injection with no complaints voiced. Site clean and dry with no bruising or swelling noted at site. See MAR for details. Band aid applied.  Patient stable during and after injection. VSS with discharge and left in satisfactory condition with no s/s of distress noted.

## 2024-01-25 ENCOUNTER — Ambulatory Visit

## 2024-01-25 DIAGNOSIS — N3281 Overactive bladder: Secondary | ICD-10-CM

## 2024-01-25 DIAGNOSIS — R399 Unspecified symptoms and signs involving the genitourinary system: Secondary | ICD-10-CM

## 2024-01-25 MED ORDER — GENTAMICIN SULFATE 40 MG/ML IJ SOLN
80.0000 mg | Freq: Once | INTRAMUSCULAR | Status: AC
  Administered 2024-01-25: 80 mg via INTRAMUSCULAR

## 2024-01-25 MED ORDER — GEMTESA 75 MG PO TABS: 1.0000 | ORAL_TABLET | Freq: Every day | ORAL | Status: AC

## 2024-01-25 NOTE — Progress Notes (Signed)
 Patient receiving Gentamicin  injection per MD order  The injection site was cleaned and prepped with alcohol. A band aid applied after injection given.   IM  injection  Medication: Gentamicin   Dose: 80 mg Location: right Ventrogluteal Lot: [id number] Exp: 01/2025  Patient tolerated well, no complications were noted  Performed by: Carlos, CMA

## 2024-01-26 ENCOUNTER — Telehealth: Payer: Self-pay | Admitting: Internal Medicine

## 2024-01-26 LAB — METHYLMALONIC ACID, SERUM: Methylmalonic Acid, Quantitative: 211 nmol/L (ref 0–378)

## 2024-01-26 NOTE — Telephone Encounter (Signed)
 Incoming call from pt regarding current GI symptoms. Pt stated she is experiencing intense stomach aches.  Pt requesting medical advice. Please advise. Thank you

## 2024-01-26 NOTE — Telephone Encounter (Signed)
Attempted to call pt, no answer.  Mailbox full.

## 2024-01-26 NOTE — Progress Notes (Signed)
 "  VIRTUAL VISIT via TELEPHONE NOTE [facility]   I connected with Ruth Terry  on 01/30/24 at  8:05 AM by telephone and verified that I am speaking with the correct person using two identifiers.  Location: Patient: Home Provider: [facility]   I discussed the limitations, risks, security and privacy concerns of performing an evaluation and management service by telephone and the availability of in person appointments. I also discussed with the patient that there may be a patient responsible charge related to this service. The patient expressed understanding and agreed to proceed.  REASON FOR VISIT:  Follow-up for severe iron deficiency state   CURRENT THERAPY: IV iron as needed   INTERVAL HISTORY:   Ruth Terry 69 y.o. female is contacted today for routine follow-up of severe iron deficiency state without anemia.   She was last evaluated via telemedicine visit by Pleasant Barefoot PA-C on 09/07/2023.   Her last IV iron was with Feraheme x 2 in October/November 2024. She was started on vitamin B12 injections after her last appointment in September 2025.   At today's visit, she reports feeling fair. In the interim since her last visit, her father passed away in Nov 21, 2023.   She has 70% energy and 100% appetite.  She endorses that she is maintaining a stable weight. She continues to have UTIs, currently with a resistant infection and receiving gentamicin  injections.   She has been taking ferrous bisglycinate about twice weekly.  She reports some increased abdominal pain and constipation, unclear if this is a flareup of her gastroparesis, GERD, and IBS, or if this is a side effect of oral iron supplementation. She has felt unchanged after starting B12 injections.    She reports mild fatigue, energy overall at baseline. She denies any rectal bleeding or melena.   She denies any pica, chest pain, or dyspnea on exertion, lightheadedness.    ASSESSMENT &  PLAN:  1.  Iron deficiency + B12 deficiency: - Labs from October 2024 showed severe iron deficiency state (ferritin 4, iron saturation 7%), but without anemia (Hgb 12.1) - She has not taken iron tablet as she has gastroparesis, GERD, and IBS. - No transfusion history. - Prior labs showed normal copper  levels. - Colonoscopy (12/09/2020): Few diverticula in the sigmoid colon and internal hemorrhoids. - EGD (12/09/2020): Normal esophagus, stomach and duodenum - Received IV Feraheme x 2 in October/2024/11/17experienced bacterial infection x3 in the months following iron infusion - She has been taking ferrous bisglycinate twice weekly, but is experiencing a flareup of abdominal symptoms (possibly related to her underlying gastroparesis GERD, and IBS) - She started monthly B12 injections in September 2025.  She failed to improve on oral supplementation alone. - No rectal bleeding or melena.   - No ice pica.  Reports some mild fatigue - Most recent labs (01/24/2024): Hgb 14.6/MCV 93.6 Ferritin 95, iron saturation 29% with normal TIBC 335. Vitamin B12 normalized at 530, MMA normalized - PLAN: Although her ferritin levels improved on oral iron, we will HOLD ferrous bisglycinate based on her gastrointestinal symptoms.  She will be seeing her GI doctor later this week.  We will defer to gastroenterologist as to whether or not she can safely resume oral iron based on GI symptoms. - No IV iron at this time, with ferritin nearly 100.  Caution with IV iron due to resistant bacterial UTI. - Continue monthly B12 injections - once levels are in the 700-800 range, will consider switching  back to oral supplementation. - We will recheck labs with RTC in 4 months with office visit.     2.  Social/Family History: - Semi-retired as a personal assistant, working at home. Tobacco use of 5 cigarettes a day for 23 years.  - No family history of cancer.    PLAN SUMMARY:  >> Monthly B12 injections >> Labs in 4  months = CBC/D, ferritin, iron/TIBC, B12, MMA >> OFFICE visit in 4 months (1 week after labs)   **Last office visit 05/31/2023     REVIEW OF SYSTEMS:   Review of Systems  Constitutional:  Positive for malaise/fatigue. Negative for chills, diaphoresis, fever and weight loss.  Respiratory:  Negative for cough and shortness of breath.   Cardiovascular:  Negative for chest pain and palpitations.  Gastrointestinal:  Positive for abdominal pain and constipation. Negative for blood in stool, melena, nausea and vomiting.  Neurological:  Negative for dizziness and headaches.     PHYSICAL EXAM: (per limitations of virtual telephone visit)  The patient is alert and oriented x 3, exhibiting adequate mentation, good mood, and ability to speak in full sentences and execute sound judgement.  WRAP UP:   I discussed the assessment and treatment plan with the patient. The patient was provided an opportunity to ask questions and all were answered. The patient agreed with the plan and demonstrated an understanding of the instructions.   The patient was advised to call back or seek an in-person evaluation if the symptoms worsen or if the condition fails to improve as anticipated.  I provided 22 minutes of non-face-to-face time during this encounter, including >10 minutes of medical discussion.  Pleasant CHRISTELLA Barefoot, PA-C 01/30/2024 9:42 AM "

## 2024-01-27 ENCOUNTER — Ambulatory Visit

## 2024-01-27 ENCOUNTER — Telehealth: Payer: Self-pay

## 2024-01-27 DIAGNOSIS — N390 Urinary tract infection, site not specified: Secondary | ICD-10-CM

## 2024-01-27 DIAGNOSIS — R339 Retention of urine, unspecified: Secondary | ICD-10-CM | POA: Diagnosis not present

## 2024-01-27 DIAGNOSIS — N3281 Overactive bladder: Secondary | ICD-10-CM

## 2024-01-27 LAB — BLADDER SCAN AMB NON-IMAGING: Scan Result: 0

## 2024-01-27 MED ORDER — TROSPIUM CHLORIDE ER 60 MG PO CP24: 1.0000 | ORAL_CAPSULE | Freq: Every day | ORAL | 11 refills | Status: AC

## 2024-01-27 MED ORDER — GENTAMICIN SULFATE 40 MG/ML IJ SOLN
80.0000 mg | Freq: Once | INTRAMUSCULAR | Status: AC
  Administered 2024-01-27: 80 mg via INTRAMUSCULAR

## 2024-01-27 MED ORDER — HYOSCYAMINE SULFATE SL 0.125 MG SL SUBL: 0.1250 | SUBLINGUAL_TABLET | Freq: Three times a day (TID) | SUBLINGUAL | 0 refills | Status: AC

## 2024-01-27 NOTE — Telephone Encounter (Signed)
 Spoke to patient. Patient instructed if any worsening or no improvement to go to the ER for further evaluation. Patient states she is going to try the medications her urologist prescribed & make a decision. Instructed there would be a provider on call if she needs to call back over the weekend.

## 2024-01-27 NOTE — Progress Notes (Signed)
 Patient receiving Gentamicin  injection per MD order  The injection site was cleaned and prepped with alcohol. A band aid applied after injection given.   IM injection  Medication: Gentamicin  Dose: 80 MG Location: right Ventrogluteal Lot: [id number] Exp: 01/2025  Patient tolerated well, no complications were noted  Performed by: Carlos, CMA   Bladder Scan completed today due to reason Urinary retention  Patient can void prior to the bladder scan. Bladder scan result: 0

## 2024-01-27 NOTE — Telephone Encounter (Signed)
 Attempted to call patient; goes straight to voicemail & mailbox is full. Unable to leave a message.   Per note: Currently on Gentamicin  injections x 7 days for UTI.

## 2024-01-27 NOTE — Telephone Encounter (Signed)
 Patient returned call. Patient states she has been dealing with a bad UTI for the past few weeks. She was given Doxycycline  twice & now on Gentamicin  injections. Patient states her urologist just called & calling in 2 more medications. Patient thinks her whole GI tract is irritated. She feels like her insides are raw. She is experiencing increased belching & reflux. Patient has also had some nausea. Patient is taking Protonix  & also using extra Pepcid  to try to help. Patient has not been able to have a good bowel movement. Patient states she has diarrhea & small pieces coming out since starting all of these antibiotics. Patient reports abdominal cramping in her lower abdomen. Patient states some may be from her bladder. Patient states she has tried Miralax  3-4 times daily for multiple days without relief. Patient reports Miralax  just does not work for her. Patient is taking Metamucil & Linzess  daily. Patient does have Carafate  that she can try if we think she needs that but she is aware it can also cause constipation. Patient was scheduled for Mon, 01/30/24 to see [place] however the office will be closed. Appointment was reschedule for Fri, 02/03/24 @ 1:30 PM.

## 2024-01-27 NOTE — Telephone Encounter (Signed)
Called pt to

## 2024-01-27 NOTE — Telephone Encounter (Signed)
 Patient called back. States her urologist did a bladder scan this am & her bladder was empty. Prescribed Trospium Chloride ER & Hycosamine. Patient states she is close to going to the ER because her stomach is hurting so bad.

## 2024-01-28 ENCOUNTER — Other Ambulatory Visit: Payer: Self-pay | Admitting: Internal Medicine

## 2024-01-30 ENCOUNTER — Encounter: Payer: Self-pay | Admitting: Physician Assistant

## 2024-01-30 ENCOUNTER — Ambulatory Visit

## 2024-01-30 ENCOUNTER — Inpatient Hospital Stay: Admitting: Physician Assistant

## 2024-01-30 ENCOUNTER — Ambulatory Visit: Admitting: Physician Assistant

## 2024-01-30 DIAGNOSIS — D509 Iron deficiency anemia, unspecified: Secondary | ICD-10-CM | POA: Diagnosis not present

## 2024-01-30 DIAGNOSIS — E538 Deficiency of other specified B group vitamins: Secondary | ICD-10-CM | POA: Diagnosis not present

## 2024-01-30 DIAGNOSIS — D508 Other iron deficiency anemias: Secondary | ICD-10-CM

## 2024-01-31 ENCOUNTER — Ambulatory Visit

## 2024-01-31 ENCOUNTER — Other Ambulatory Visit (HOSPITAL_COMMUNITY): Payer: Self-pay

## 2024-02-01 ENCOUNTER — Ambulatory Visit

## 2024-02-01 ENCOUNTER — Telehealth: Payer: Self-pay

## 2024-02-01 DIAGNOSIS — N390 Urinary tract infection, site not specified: Secondary | ICD-10-CM | POA: Diagnosis not present

## 2024-02-01 MED ORDER — GENTAMICIN SULFATE 40 MG/ML IJ SOLN
80.0000 mg | Freq: Once | INTRAMUSCULAR | Status: AC
  Administered 2024-02-01: 80 mg via INTRAMUSCULAR

## 2024-02-01 NOTE — Progress Notes (Signed)
 Patient receiving Gentamicin  injection per MD order  The injection site was cleaned and prepped with alcohol. A band aid applied after injection given.   IM  injection  Medication: Gentamicin   Dose: 80 mg Location: right Ventrogluteal Lot: [id number] Exp: 01/2025  Patient tolerated well, no complications were noted  Performed by: Carlos, CMA

## 2024-02-01 NOTE — Telephone Encounter (Signed)
 Pt state's that she will hold off on taking hyoscyamine  sulfa  because it is causing her constipation.

## 2024-02-03 ENCOUNTER — Ambulatory Visit

## 2024-02-03 ENCOUNTER — Ambulatory Visit: Admitting: Physician Assistant

## 2024-02-03 ENCOUNTER — Encounter: Payer: Self-pay | Admitting: Physician Assistant

## 2024-02-03 ENCOUNTER — Telehealth: Payer: Self-pay

## 2024-02-03 VITALS — BP 114/64 | HR 72 | Ht 59.0 in | Wt 114.2 lb

## 2024-02-03 DIAGNOSIS — K219 Gastro-esophageal reflux disease without esophagitis: Secondary | ICD-10-CM | POA: Diagnosis not present

## 2024-02-03 DIAGNOSIS — K581 Irritable bowel syndrome with constipation: Secondary | ICD-10-CM | POA: Diagnosis not present

## 2024-02-03 DIAGNOSIS — R110 Nausea: Secondary | ICD-10-CM

## 2024-02-03 DIAGNOSIS — K3184 Gastroparesis: Secondary | ICD-10-CM

## 2024-02-03 DIAGNOSIS — N390 Urinary tract infection, site not specified: Secondary | ICD-10-CM | POA: Diagnosis not present

## 2024-02-03 MED ORDER — RESTORA PO CAPS: 1.0000 | ORAL_CAPSULE | Freq: Every day | ORAL | Status: AC

## 2024-02-03 MED ORDER — ESOMEPRAZOLE MAGNESIUM 40 MG PO CPDR: 40.0000 mg | DELAYED_RELEASE_CAPSULE | Freq: Every day | ORAL | 5 refills | Status: AC

## 2024-02-03 MED ORDER — IBSRELA 50 MG PO TABS: 1.0000 | ORAL_TABLET | Freq: Two times a day (BID) | ORAL | 11 refills | Status: AC

## 2024-02-03 MED ORDER — GENTAMICIN SULFATE 40 MG/ML IJ SOLN
80.0000 mg | Freq: Once | INTRAMUSCULAR | Status: AC
  Administered 2024-02-03: 80 mg via INTRAMUSCULAR

## 2024-02-03 NOTE — Telephone Encounter (Signed)
 Called pt to rescheduled appointment, no answer and unable to LVM due to mailbox full.

## 2024-02-03 NOTE — Progress Notes (Signed)
 Patient receiving Gentamicin  injection per MD order  The injection site was cleaned and prepped with alcohol. A band aid applied after injection given.   IM injection  Medication: Gentamicin  Dose: 80 MG Location: left Ventrogluteal Lot: [id number] Exp: 01/2025  Patient tolerated well, no complications were noted  Performed by: Carlos, CMA

## 2024-02-03 NOTE — Patient Instructions (Signed)
 Stop taking Linzess  and pantoprazole .   We have sent the following medications to your pharmacy for you to pick up at your convenience: Iona to take one capsule by mouth twice daily. Samples have been given in the office today. Also, Nexium  40 mg to take one tablet daily.  Samples of Restora have also been given in the office today. Take once capsule by mouth once daily.  You have been scheduled for a gastric emptying scan at [facility] on 02/16/24 at 8:00am. Please arrive at least 30 minutes prior to your appointment for registration. Please make certain not to have anything to eat or drink after midnight the night before your test. Hold all stomach medications (ex: Zofran , phenergan, Reglan ) 24 hours prior to your test. If you need to reschedule your appointment, please contact radiology scheduling at [phone]. _____________________________________________________________________ A gastric-emptying study measures how long it takes for food to move through your stomach. There are several ways to measure stomach emptying. In the most common test, you eat food that contains a small amount of radioactive material. A scanner that detects the movement of the radioactive material is placed over your abdomen to monitor the rate at which food leaves your stomach. This test normally takes about 4 hours to complete. _____________________________________________________________________   If your blood pressure at your visit was 140/90 or greater, please contact your primary care physician to follow up on this.  _______________________________________________________  If you are age 56 or older, your body mass index should be between 23-30. Your Body mass index is 23.08 kg/m. If this is out of the aforementioned range listed, please consider follow up with your Primary Care Provider.  If you are age 28 or younger, your body mass index should be between 19-25. Your Body mass index is 23.08  kg/m. If this is out of the aformentioned range listed, please consider follow up with your Primary Care Provider.   ________________________________________________________  The [facility] providers would like to encourage you to use MYCHART to communicate with providers for non-urgent requests or questions.  Due to long hold times on the telephone, sending your provider a message by [facility] may be a faster and more efficient way to get a response.  Please allow 48 business hours for a response.  Please remember that this is for non-urgent requests.  _______________________________________________________  [facility] is using a team-based approach to care.  Your team is made up of your doctor and two to three APPS. Our APPS (Nurse Practitioners and Physician Assistants) work with your physician to ensure care continuity for you. They are fully qualified to address your health concerns and develop a treatment plan. They communicate directly with your gastroenterologist to care for you. Seeing the Advanced Practice Practitioners on your physician's team can help you by facilitating care more promptly, often allowing for earlier appointments, access to diagnostic testing, procedures, and other specialty referrals.

## 2024-02-04 NOTE — Progress Notes (Signed)
 Noted.

## 2024-02-06 ENCOUNTER — Ambulatory Visit

## 2024-02-06 ENCOUNTER — Telehealth: Payer: Self-pay

## 2024-02-06 ENCOUNTER — Other Ambulatory Visit (HOSPITAL_COMMUNITY): Payer: Self-pay

## 2024-02-06 NOTE — Telephone Encounter (Signed)
 Pharmacy Patient Advocate Encounter   Received notification from [facility] KEY that prior authorization for Trospium  is required/requested.   Insurance verification completed.   The patient is insured through [employer]   Per test claim:  Tablets is preferred by the insurance.  If suggested medication is appropriate, Please send in a new RX and discontinue this one. If not, please advise as to why it's not appropriate so that we may request a Prior Authorization. Please note, some preferred medications may still require a PA.  If the suggested medications have not been trialed and there are no contraindications to their use, the PA will not be submitted, as it will not be approved. Archived Key:    Pt.'s insurance will cover the tablets and not the capsules.  Please send in Rx for the tablet or one of the other preferred medications oxybutynin,solifenacin

## 2024-02-07 ENCOUNTER — Telehealth: Payer: Self-pay

## 2024-02-07 ENCOUNTER — Ambulatory Visit

## 2024-02-07 DIAGNOSIS — N390 Urinary tract infection, site not specified: Secondary | ICD-10-CM | POA: Diagnosis not present

## 2024-02-07 MED ORDER — GENTAMICIN SULFATE 40 MG/ML IJ SOLN
80.0000 mg | Freq: Once | INTRAMUSCULAR | Status: AC
  Administered 2024-02-07: 80 mg via INTRAMUSCULAR

## 2024-02-07 NOTE — Telephone Encounter (Addendum)
 Pt came in today for Gentamicin  injection. Pt state's she went to her GI doctor on yesterday was advised not to take Trospium  chloride due to not complete emptying of her bowels and one of the side effect is constipation. Pt is scheduled Gastric emptying procedure coming up. Pt wants to know if there is anything she can take that will not interfere with her bowels not emptying or do the MD feel like she need to be on something for her OAB . Pt is aware a message will be sent to MD on advisement.

## 2024-02-08 ENCOUNTER — Ambulatory Visit

## 2024-02-08 ENCOUNTER — Other Ambulatory Visit: Payer: Self-pay

## 2024-02-08 ENCOUNTER — Ambulatory Visit (HOSPITAL_COMMUNITY)
Admission: RE | Admit: 2024-02-08 | Discharge: 2024-02-08 | Disposition: A | Source: Ambulatory Visit | Attending: Acute Care | Admitting: Acute Care

## 2024-02-08 DIAGNOSIS — N390 Urinary tract infection, site not specified: Secondary | ICD-10-CM

## 2024-02-08 DIAGNOSIS — R319 Hematuria, unspecified: Secondary | ICD-10-CM

## 2024-02-08 DIAGNOSIS — Z122 Encounter for screening for malignant neoplasm of respiratory organs: Secondary | ICD-10-CM

## 2024-02-08 DIAGNOSIS — Z87891 Personal history of nicotine dependence: Secondary | ICD-10-CM

## 2024-02-08 LAB — URINALYSIS, ROUTINE W REFLEX MICROSCOPIC
Bilirubin, UA: NEGATIVE
Glucose, UA: NEGATIVE
Ketones, UA: NEGATIVE
Leukocytes,UA: NEGATIVE
Nitrite, UA: NEGATIVE
Protein,UA: NEGATIVE
Specific Gravity, UA: 1.02 (ref 1.005–1.030)
Urobilinogen, Ur: 1 mg/dL (ref 0.2–1.0)
pH, UA: 5.5 (ref 5.0–7.5)

## 2024-02-08 LAB — MICROSCOPIC EXAMINATION: Bacteria, UA: NONE SEEN

## 2024-02-08 MED ORDER — GENTAMICIN SULFATE 40 MG/ML IJ SOLN
80.0000 mg | Freq: Once | INTRAMUSCULAR | Status: AC
  Administered 2024-02-08: 80 mg via INTRAMUSCULAR

## 2024-02-08 NOTE — Progress Notes (Signed)
 Patient presents today with complaints of  Hematuria .  UA and Culture done today.  Dr. Sherrilee  reviewed results and No Treatment  .  Patient aware of MD recommendations and that we will reach out with culture results.      Ruth Terry, CMA

## 2024-02-08 NOTE — Progress Notes (Cosign Needed Addendum)
 Patient receiving Gentamicin  injection per MD order  The injection site was cleaned and prepped with alcohol. A band aid applied after injection given.   IM injection  Medication: Gentamicin  Dose: 80 mg Location: left Ventrogluteal Lot: [id number] Exp: 01/2025  Patient tolerated well, no complications were noted  Performed by: Carlos, CMA   Patient requested UA/UC for Hematuria

## 2024-02-09 ENCOUNTER — Inpatient Hospital Stay

## 2024-02-10 ENCOUNTER — Telehealth: Payer: Self-pay | Admitting: Physician Assistant

## 2024-02-10 ENCOUNTER — Other Ambulatory Visit: Payer: Self-pay

## 2024-02-10 LAB — URINE CULTURE

## 2024-02-10 MED ORDER — LUBIPROSTONE 24 MCG PO CAPS: 24.0000 ug | ORAL_CAPSULE | Freq: Two times a day (BID) | ORAL | 3 refills | Status: AC

## 2024-02-10 NOTE — Telephone Encounter (Signed)
 Pt states she cannot afford Ibsrela . She is calling to see if there is an alternative that she may be able to get. Please advise.

## 2024-02-10 NOTE — Telephone Encounter (Signed)
 Inbound call from patient requesting to speak to the nurse about her medication IBSrela  due to her not being able to afford the medication. Patient stated that her insurance will not cover this and it would be 3000 a month. Please advise.

## 2024-02-10 NOTE — Telephone Encounter (Signed)
 Prescription has been sent to pharmacy, pt notified via mychart.

## 2024-02-16 ENCOUNTER — Other Ambulatory Visit (HOSPITAL_COMMUNITY)

## 2024-02-20 ENCOUNTER — Inpatient Hospital Stay: Attending: Hematology

## 2024-02-28 ENCOUNTER — Other Ambulatory Visit (HOSPITAL_COMMUNITY)

## 2024-03-16 ENCOUNTER — Ambulatory Visit: Admitting: Physician Assistant

## 2024-03-19 ENCOUNTER — Inpatient Hospital Stay: Attending: Hematology

## 2024-03-20 ENCOUNTER — Encounter (HOSPITAL_COMMUNITY)

## 2024-04-19 ENCOUNTER — Inpatient Hospital Stay: Attending: Hematology

## 2024-04-24 ENCOUNTER — Ambulatory Visit: Admitting: Internal Medicine

## 2024-05-14 ENCOUNTER — Inpatient Hospital Stay: Attending: Hematology

## 2024-05-14 ENCOUNTER — Inpatient Hospital Stay

## 2024-05-21 ENCOUNTER — Inpatient Hospital Stay: Admitting: Physician Assistant

## 2024-07-25 ENCOUNTER — Ambulatory Visit: Admitting: Urology
